# Patient Record
Sex: Male | Born: 1935 | Hispanic: No | Marital: Married | Smoking: Never smoker
Health system: Southern US, Community
[De-identification: ages and names within clinical notes are randomized; demographics above are authoritative.]

## PROBLEM LIST (undated history)

## (undated) DIAGNOSIS — R0902 Hypoxemia: Secondary | ICD-10-CM

## (undated) DIAGNOSIS — R0609 Other forms of dyspnea: Secondary | ICD-10-CM

## (undated) DIAGNOSIS — Z950 Presence of cardiac pacemaker: Secondary | ICD-10-CM

## (undated) DIAGNOSIS — J189 Pneumonia, unspecified organism: Secondary | ICD-10-CM

## (undated) DIAGNOSIS — N400 Enlarged prostate without lower urinary tract symptoms: Secondary | ICD-10-CM

## (undated) DIAGNOSIS — Z9981 Dependence on supplemental oxygen: Secondary | ICD-10-CM

## (undated) DIAGNOSIS — E785 Hyperlipidemia, unspecified: Secondary | ICD-10-CM

## (undated) DIAGNOSIS — I2510 Atherosclerotic heart disease of native coronary artery without angina pectoris: Secondary | ICD-10-CM

## (undated) DIAGNOSIS — Z974 Presence of external hearing-aid: Secondary | ICD-10-CM

## (undated) DIAGNOSIS — Z9989 Dependence on other enabling machines and devices: Secondary | ICD-10-CM

## (undated) DIAGNOSIS — J8410 Pulmonary fibrosis, unspecified: Secondary | ICD-10-CM

## (undated) DIAGNOSIS — R0600 Dyspnea, unspecified: Secondary | ICD-10-CM

## (undated) DIAGNOSIS — G4733 Obstructive sleep apnea (adult) (pediatric): Secondary | ICD-10-CM

## (undated) DIAGNOSIS — K219 Gastro-esophageal reflux disease without esophagitis: Secondary | ICD-10-CM

## (undated) DIAGNOSIS — I10 Essential (primary) hypertension: Secondary | ICD-10-CM

## (undated) HISTORY — DX: Other forms of dyspnea: R06.09

## (undated) HISTORY — DX: Pulmonary fibrosis, unspecified: J84.10

## (undated) HISTORY — DX: Atherosclerotic heart disease of native coronary artery without angina pectoris: I25.10

## (undated) HISTORY — DX: Hypoxemia: R09.02

## (undated) HISTORY — PX: INGUINAL HERNIA REPAIR: SUR1180

## (undated) HISTORY — DX: Dyspnea, unspecified: R06.00

## (undated) HISTORY — PX: COLONOSCOPY WITH ESOPHAGOGASTRODUODENOSCOPY (EGD): SHX5779

## (undated) HISTORY — DX: Hyperlipidemia, unspecified: E78.5

---

## 2006-12-13 ENCOUNTER — Inpatient Hospital Stay: Payer: Self-pay | Admitting: Cardiovascular Disease

## 2006-12-13 ENCOUNTER — Other Ambulatory Visit: Payer: Self-pay

## 2007-05-26 ENCOUNTER — Ambulatory Visit: Payer: Self-pay | Admitting: Cardiovascular Disease

## 2007-12-27 ENCOUNTER — Ambulatory Visit: Payer: Self-pay | Admitting: Surgery

## 2007-12-29 ENCOUNTER — Ambulatory Visit: Payer: Self-pay | Admitting: Surgery

## 2007-12-30 ENCOUNTER — Emergency Department: Payer: Self-pay | Admitting: Emergency Medicine

## 2008-03-22 HISTORY — PX: CORONARY ANGIOPLASTY WITH STENT PLACEMENT: SHX49

## 2009-11-18 ENCOUNTER — Emergency Department: Payer: Self-pay | Admitting: Emergency Medicine

## 2012-05-05 ENCOUNTER — Ambulatory Visit: Payer: Self-pay | Admitting: Cardiovascular Disease

## 2012-05-12 ENCOUNTER — Encounter: Payer: Self-pay | Admitting: Cardiovascular Disease

## 2012-05-12 ENCOUNTER — Ambulatory Visit (INDEPENDENT_AMBULATORY_CARE_PROVIDER_SITE_OTHER): Payer: Medicare Other | Admitting: Cardiovascular Disease

## 2012-05-12 VITALS — BP 120/62 | Ht 68.0 in | Wt 169.2 lb

## 2012-05-12 DIAGNOSIS — R0989 Other specified symptoms and signs involving the circulatory and respiratory systems: Secondary | ICD-10-CM

## 2012-05-12 DIAGNOSIS — R0609 Other forms of dyspnea: Secondary | ICD-10-CM

## 2012-05-12 DIAGNOSIS — I2510 Atherosclerotic heart disease of native coronary artery without angina pectoris: Secondary | ICD-10-CM

## 2012-05-12 DIAGNOSIS — R0600 Dyspnea, unspecified: Secondary | ICD-10-CM

## 2012-05-12 DIAGNOSIS — E785 Hyperlipidemia, unspecified: Secondary | ICD-10-CM

## 2012-05-12 NOTE — Assessment & Plan Note (Signed)
He is doing well overall except for mild exertional dyspnea with no recent cardiac evaluation. Thus, I will obtain a treadmill stress test. Baseline ECG is normal. Continue medical therapy.

## 2012-05-12 NOTE — Assessment & Plan Note (Signed)
He has not had any recent blood work. I will check routine labs on him today. Continue treatment with rosuvastatin. He has not taken niacin lately.

## 2012-05-12 NOTE — Patient Instructions (Addendum)
Labs today.   Your physician has requested that you have an exercise tolerance test. For further information please visit [email or web address] Please also follow instruction sheet, as given.  Continue same medications.  Follow up in 1 year.

## 2012-05-12 NOTE — Progress Notes (Signed)
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 I saw him in 2008 when he presented with unstable angina. Cardiac catheterization showed 99% ostial first diagonal stenosis as well as mild left main and LAD disease. He underwent successful angioplasty and drug-eluting stent placement to the ostial first diagonal. He has not had any cardiac events since then. He was on dual antiplatelet therapy for one year. He has known history of hyperlipidemia. Overall, he has been doing very well with no recurrent chest pain. He only reports mild exertional dyspnea. No palpitations, orthopnea or PND.  No Known Allergies   No current outpatient prescriptions on file prior to visit.   No current facility-administered medications on file prior to visit.     Past Medical History  Diagnosis Date  . Coronary artery disease     Cardiac cath September 2008: 20% left main stenosis, 40% mid LAD stenosis, 99% ostial first diagonal stenosis in a large branch and 90% ostial disease in second diagonal but the vessel was very small, 20% mid RCA stenosis with normal ejection fraction. Successful angioplasty and drug-eluting stent placement to the ostial first diagonal with a 2.5 x 15 mm Xience stent  . Hyperlipidemia      Past Surgical History  Procedure Laterality Date  . Cardiac catheterization      2010  . Coronary angioplasty  2010     History reviewed. No pertinent family history.   History   Social History  . Marital Status: Married    Spouse Name: N/A    Number of Children: N/A  . Years of Education: N/A   Occupational History  . Not on file.   Social History Main Topics  . Smoking status: Never Smoker   . Smokeless tobacco: Not on file  . Alcohol Use: Not on file  . Drug Use: Not on file  . Sexually Active: Not on file   Other Topics Concern  . Not on file   Social History Narrative  . No narrative on file      ROS Constitutional: Negative for fever, chills, diaphoresis, activity change, appetite change and fatigue.  HENT: Negative for hearing loss, nosebleeds, congestion, sore throat, facial swelling, drooling, trouble swallowing, neck pain, voice change, sinus pressure and tinnitus.  Eyes: Negative for photophobia, pain, discharge and visual disturbance.  Respiratory: Negative for apnea, cough, chest tightness  and wheezing.  Cardiovascular: Negative for chest pain, palpitations and leg swelling.  Gastrointestinal: Negative for nausea, vomiting, abdominal pain, diarrhea, constipation, blood in stool and abdominal distention.  Genitourinary: Negative for dysuria, urgency, frequency, hematuria and decreased urine volume.  Musculoskeletal: Negative for myalgias, back pain, joint swelling, arthralgias and gait problem.  Skin: Negative for color change, pallor, rash and wound.  Neurological: Negative for dizziness, tremors, seizures, syncope, speech difficulty, weakness, light-headedness, numbness and headaches.  Psychiatric/Behavioral: Negative for suicidal ideas, hallucinations, behavioral problems and agitation. The patient is not nervous/anxious.     PHYSICAL EXAM   BP 120/62  Ht 5\' 8"  (1.727 m)  Wt 169 lb 4 oz (76.771 kg)  BMI 25.74 kg/m2 Constitutional: He is oriented to person, place, and time. He appears well-developed and well-nourished. No distress.  HENT: No nasal discharge.  Head: Normocephalic and atraumatic.  Eyes: Pupils are equal and round. Right eye exhibits no discharge. Left eye exhibits no discharge.  Neck: Normal range of motion. Neck supple. No JVD present. No thyromegaly present.  Cardiovascular: Normal rate,  regular rhythm, normal heart sounds and. Exam reveals no gallop and no friction rub. There is a 1/6 systolic ejection murmur at the aortic area. Pulmonary/Chest: Effort normal and breath sounds normal. No stridor. No respiratory distress. He has no wheezes. He  has no rales. He exhibits no tenderness.  Abdominal: Soft. Bowel sounds are normal. He exhibits no distension. There is no tenderness. There is no rebound and no guarding.  Musculoskeletal: Normal range of motion. He exhibits no edema and no tenderness.  Neurological: He is alert and oriented to person, place, and time. Coordination normal.  Skin: Skin is warm and dry. No rash noted. He is not diaphoretic. No erythema. No pallor.  Psychiatric: He has a normal mood and affect. His behavior is normal. Judgment and thought content normal.       EKG: Normal sinus rhythm   ASSESSMENT AND PLAN

## 2012-05-13 LAB — CBC WITH DIFFERENTIAL/PLATELET
Basophils Absolute: 0 10*3/uL (ref 0.0–0.2)
Basos: 0 % (ref 0–3)
Eosinophils Absolute: 0.2 10*3/uL (ref 0.0–0.4)
HCT: 41 % (ref 37.5–51.0)
Hemoglobin: 13.6 g/dL (ref 12.6–17.7)
Lymphocytes Absolute: 1.6 10*3/uL (ref 0.7–3.1)
MCH: 29.9 pg (ref 26.6–33.0)
MCHC: 33.2 g/dL (ref 31.5–35.7)
MCV: 90 fL (ref 79–97)
Monocytes Absolute: 0.6 10*3/uL (ref 0.1–0.9)
Neutrophils Absolute: 4.8 10*3/uL (ref 1.4–7.0)
RBC: 4.55 x10E6/uL (ref 4.14–5.80)

## 2012-05-13 LAB — HEPATIC FUNCTION PANEL
AST: 20 IU/L (ref 0–40)
Albumin: 3.9 g/dL (ref 3.5–4.8)
Total Bilirubin: 0.4 mg/dL (ref 0.0–1.2)
Total Protein: 7.1 g/dL (ref 6.0–8.5)

## 2012-05-13 LAB — LIPID PANEL
Chol/HDL Ratio: 3.8 ratio units (ref 0.0–5.0)
Cholesterol, Total: 130 mg/dL (ref 100–199)
LDL Calculated: 84 mg/dL (ref 0–99)
VLDL Cholesterol Cal: 12 mg/dL (ref 5–40)

## 2012-05-13 LAB — BASIC METABOLIC PANEL
BUN/Creatinine Ratio: 13 (ref 10–22)
Chloride: 101 mmol/L (ref 97–108)
GFR calc Af Amer: 96 mL/min/{1.73_m2} (ref >59)
GFR calc non Af Amer: 83 mL/min/{1.73_m2} (ref >59)
Glucose: 97 mg/dL (ref 65–99)
Potassium: 4.6 mmol/L (ref 3.5–5.2)

## 2012-05-18 ENCOUNTER — Encounter: Payer: Medicare Other | Admitting: Cardiovascular Disease

## 2012-11-06 ENCOUNTER — Ambulatory Visit: Payer: Medicare Other | Admitting: Cardiovascular Disease

## 2012-12-11 ENCOUNTER — Ambulatory Visit (INDEPENDENT_AMBULATORY_CARE_PROVIDER_SITE_OTHER): Payer: Medicare Other | Admitting: Cardiovascular Disease

## 2012-12-11 ENCOUNTER — Encounter: Payer: Self-pay | Admitting: Cardiovascular Disease

## 2012-12-11 DIAGNOSIS — R0609 Other forms of dyspnea: Secondary | ICD-10-CM

## 2012-12-11 DIAGNOSIS — R0600 Dyspnea, unspecified: Secondary | ICD-10-CM

## 2012-12-11 NOTE — Procedures (Signed)
    Treadmill Stress test  Indication: Exertional dyspnea with known history of coronary artery disease.  Baseline Data:  Resting EKG shows NSR with rate of 70 bpm, no significant ST or T wave changes. Resting blood pressure of 128/80 mm Hg Stand bruce protocal was used.  Exercise Data:  Patient exercised for 6 min 43 sec,  Peak heart rate of 130 bpm.  This was 92 % of the maximum predicted heart rate. No symptoms of chest pain or lightheadedness were reported at peak stress or in recovery.  Peak Blood pressure recorded was 180/82 Maximal work level: 7 METs.  Heart rate at 3 minutes in recovery was 78 bpm. BP response: Normal HR response: Normal  EKG with Exercise: Sinus tachycardia with no significant ST changes.  FINAL IMPRESSION: Normal exercise stress test. No significant EKG changes concerning for ischemia. Good exercise tolerance.  Recommendation: Continue medical therapy. I advised him to start an exercise program.

## 2012-12-11 NOTE — Patient Instructions (Addendum)
Your stress test is normal.  Follow up in 6 months.

## 2013-03-09 ENCOUNTER — Ambulatory Visit: Payer: Self-pay | Admitting: Unknown Physician Specialty

## 2013-03-16 LAB — PATHOLOGY REPORT

## 2013-07-13 ENCOUNTER — Ambulatory Visit: Payer: Medicare Other | Admitting: Cardiovascular Disease

## 2013-07-23 ENCOUNTER — Ambulatory Visit (INDEPENDENT_AMBULATORY_CARE_PROVIDER_SITE_OTHER): Payer: Medicare Other | Admitting: Cardiovascular Disease

## 2013-07-23 ENCOUNTER — Encounter: Payer: Self-pay | Admitting: Cardiovascular Disease

## 2013-07-23 ENCOUNTER — Ambulatory Visit: Payer: Medicare Other | Admitting: Cardiovascular Disease

## 2013-07-23 VITALS — BP 104/68 | HR 72 | Ht 68.0 in | Wt 158.5 lb

## 2013-07-23 DIAGNOSIS — I2510 Atherosclerotic heart disease of native coronary artery without angina pectoris: Secondary | ICD-10-CM

## 2013-07-23 DIAGNOSIS — R0602 Shortness of breath: Secondary | ICD-10-CM

## 2013-07-23 DIAGNOSIS — E785 Hyperlipidemia, unspecified: Secondary | ICD-10-CM

## 2013-07-23 NOTE — Progress Notes (Signed)
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 In 2008 when he presented with unstable angina. Cardiac catheterization showed 99% ostial first diagonal stenosis as well as mild left main and LAD disease. He underwent successful angioplasty and drug-eluting stent placement to the ostial first diagonal. He has not had any cardiac events since then. He was on dual antiplatelet therapy for one year. He has known history of hyperlipidemia. Overall, he has been doing very well with no recurrent chest pain. He had exertional dyspnea last year. He underwent a treadmill stress test which showed no evidence of ischemia. He stays very active with gardening. He asked he feels better than before. He has no significant exertional limitation.  No Known Allergies   Current Outpatient Prescriptions on File Prior to Visit  Medication Sig Dispense Refill  . aspirin 325 MG tablet Take 325 mg by mouth daily.      . EXELON 9.5 MG/24HR Place 1 patch onto the skin daily.       . metoprolol tartrate (LOPRESSOR) 25 MG tablet Take 25 mg by mouth 2 (two) times daily.       . rosuvastatin (CRESTOR) 20 MG tablet Take 20 mg by mouth daily.      [name]. zolpidem (AMBIEN) 10 MG tablet Take 10 mg by mouth as needed.        No current facility-administered medications on file prior to visit.     Past Medical History  Diagnosis Date  . Coronary artery disease     Cardiac cath September 2008: 20% left main stenosis, 40% mid LAD stenosis, 99% ostial first diagonal stenosis in a large branch and 90% ostial disease in second diagonal but the vessel was very small, 20% mid RCA stenosis with normal ejection fraction. Successful angioplasty and drug-eluting stent placement to the ostial first diagonal with a 2.5 x 15 mm Xience stent  . Hyperlipidemia      Past Surgical History  Procedure Laterality Date  . Cardiac catheterization      2010  .  Coronary angioplasty  2010     Family History  Problem Relation Age of Onset  . Family history unknown: Yes     History   Social History  . Marital Status: Married    Spouse Name: N/A    Number of Children: N/A  . Years of Education: N/A   Occupational History  . Not on file.   Social History Main Topics  . Smoking status: Never Smoker   . Smokeless tobacco: Not on file  . Alcohol Use: Not on file  . Drug Use: Not on file  . Sexual Activity: Not on file   Other Topics Concern  . Not on file   Social History Narrative  . No narrative on file       PHYSICAL EXAM   BP 104/68  Pulse 72  Ht 5\' 8"  (1.727 m)  Wt 158 lb 8 oz (71.895 kg)  BMI 24.11 kg/m2 Constitutional: He is oriented to person, place, and time. He appears well-developed and well-nourished. No distress.  HENT: No nasal discharge.  Head: Normocephalic and atraumatic.  Eyes: Pupils are equal and round. Right eye exhibits no discharge. Left eye exhibits no discharge.  Neck: Normal range of motion. Neck supple. No JVD present. No thyromegaly present.  Cardiovascular: Normal rate, regular rhythm, normal heart sounds and. Exam reveals no gallop and no friction rub. There  is a 1/6 systolic ejection murmur at the aortic area. Pulmonary/Chest: Effort normal and breath sounds normal. No stridor. No respiratory distress. He has no wheezes. He has no rales. He exhibits no tenderness.  Abdominal: Soft. Bowel sounds are normal. He exhibits no distension. There is no tenderness. There is no rebound and no guarding.  Musculoskeletal: Normal range of motion. He exhibits no edema and no tenderness.  Neurological: He is alert and oriented to person, place, and time. Coordination normal.  Skin: Skin is warm and dry. No rash noted. He is not diaphoretic. No erythema. No pallor.  Psychiatric: He has a normal mood and affect. His behavior is normal. Judgment and thought content normal.       EKG: Normal sinus  rhythm   ASSESSMENT AND PLAN

## 2013-07-23 NOTE — Patient Instructions (Signed)
Labs today.   Continue same medications.   Your physician wants you to follow-up in: 1 year.  You will receive a reminder letter in the mail two months in advance. If you don't receive a letter, please call our office to schedule the follow-up appointment.

## 2013-07-23 NOTE — Assessment & Plan Note (Signed)
Lab Results  Component Value Date   HDL 34* 05/12/2012   LDLCALC 84 05/12/2012   TRIG 62 05/12/2012   CHOLHDL 3.8 05/12/2012   Continue treatment with rosuvastatin. Check fasting lipid and liver profile today.

## 2013-07-23 NOTE — Assessment & Plan Note (Signed)
He is doing very well with no symptoms suggestive of angina. Continue medical therapy. Stress test last year was normal.

## 2013-07-24 LAB — HEPATIC FUNCTION PANEL
ALBUMIN: 3.9 g/dL (ref 3.5–4.8)
ALK PHOS: 65 IU/L (ref 39–117)
ALT: 10 IU/L (ref 0–44)
AST: 27 IU/L (ref 0–40)
BILIRUBIN TOTAL: 0.5 mg/dL (ref 0.0–1.2)
Bilirubin, Direct: 0.14 mg/dL (ref 0.00–0.40)
Total Protein: 7.2 g/dL (ref 6.0–8.5)

## 2013-07-24 LAB — LIPID PANEL
CHOLESTEROL TOTAL: 122 mg/dL (ref 100–199)
Chol/HDL Ratio: 3.1 ratio units (ref 0.0–5.0)
HDL: 39 mg/dL — ABNORMAL LOW (ref >39)
LDL Calculated: 72 mg/dL (ref 0–99)
TRIGLYCERIDES: 55 mg/dL (ref 0–149)
VLDL Cholesterol Cal: 11 mg/dL (ref 5–40)

## 2014-06-10 ENCOUNTER — Encounter: Payer: Self-pay | Admitting: Cardiovascular Disease

## 2014-06-10 ENCOUNTER — Ambulatory Visit (INDEPENDENT_AMBULATORY_CARE_PROVIDER_SITE_OTHER): Payer: Medicare Other | Admitting: Cardiovascular Disease

## 2014-06-10 VITALS — BP 122/70 | HR 63 | Ht 68.5 in | Wt 158.2 lb

## 2014-06-10 DIAGNOSIS — I2510 Atherosclerotic heart disease of native coronary artery without angina pectoris: Secondary | ICD-10-CM

## 2014-06-10 DIAGNOSIS — E785 Hyperlipidemia, unspecified: Secondary | ICD-10-CM

## 2014-06-10 DIAGNOSIS — R079 Chest pain, unspecified: Secondary | ICD-10-CM

## 2014-06-10 NOTE — Assessment & Plan Note (Signed)
Lab Results  Component Value Date   CHOL 122 07/23/2013   HDL 39* 07/23/2013   LDLCALC 72 07/23/2013   TRIG 55 07/23/2013   CHOLHDL 3.1 07/23/2013   Continued continue treatment with high-dose rosuvastatin. Most recent LDL was 72.

## 2014-06-10 NOTE — Progress Notes (Signed)
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 In 2008 when he presented with unstable angina. Cardiac catheterization showed 99% ostial first diagonal stenosis as well as mild left main and LAD disease. He underwent successful angioplasty and drug-eluting stent placement to the ostial first diagonal. He has not had any cardiac events since then. He was on dual antiplatelet therapy for one year. He has known history of hyperlipidemia. He stays very active with gardening. Most recent treadmill stress test in September 2014 showed no evidence of ischemia. He had spicy food on Friday night. He started having significant heartburn on Saturday associated with hiccups which has continued throughout 10  . Coronary angioplasty  2010     Family History  Problem Relation Age of Onset  . Family history unknown: Yes     History   Social History  . Marital Status: Married    Spouse Name: N/A  . Number of Children: N/A  . Years of Education: N/A   Occupational History  . Not on file.   Social History Main Topics  . Smoking status: Never Smoker   . Smokeless tobacco: Not on file  . Alcohol Use: No  . Drug Use: No  . Sexual Activity: Not on file   Other Topics Concern  . Not on file   Social History Narrative       PHYSICAL EXAM   BP 122/70 mmHg  Pulse 63  Ht 5' 8.5" (1.74 m)  Wt 158 lb 4 oz (71.782 kg)  BMI 23.71 kg/m2 Constitutional: He is oriented to person, place, and time. He appears well-developed and well-nourished. No distress.  HENT: No nasal discharge.  Head: Normocephalic and atraumatic.  Eyes: Pupils are equal and round. Right eye exhibits no discharge. Left eye exhibits no discharge.  Neck: Normal range of motion. Neck supple. No JVD present. No thyromegaly present.  Cardiovascular: Normal rate, regular rhythm, normal heart sounds and. Exam reveals no gallop and no friction rub. There is a 1/6 systolic ejection murmur at the aortic area. Pulmonary/Chest: Effort normal and breath sounds normal. No stridor. No respiratory distress. He has no wheezes. He has no rales.  He exhibits no tenderness.  Abdominal: Soft. Bowel sounds are normal. He exhibits no distension. There is no tenderness. There is no rebound and no guarding.  Musculoskeletal: Normal range of motion. He exhibits no edema and no tenderness.  Neurological: He is alert and oriented to person, place, and time. Coordination normal.  Skin: Skin is warm and dry. No rash noted. He is not diaphoretic. No erythema. No pallor.  Psychiatric: He has a normal mood and affect. His behavior is normal. Judgment and thought content normal.       EKG: Normal sinus  rhythm   ASSESSMENT AND PLAN

## 2014-06-10 NOTE — Patient Instructions (Addendum)
[facility] MYOVIEW  Your caregiver has ordered a Stress Test with nuclear imaging. The purpose of this test is to evaluate the blood supply to your heart muscle. This procedure is referred to as a "Non-Invasive Stress Test." This is because other than having an IV started in your vein, nothing is inserted or "invades" your body. Cardiac stress tests are done to find areas of poor blood flow to the heart by determining the extent of coronary artery disease (CAD). Some patients exercise on a treadmill, which naturally increases the blood flow to your heart, while others who are  unable to walk on a treadmill due to physical limitations have a pharmacologic/chemical stress agent called Lexiscan . This medicine will mimic walking on a treadmill by temporarily increasing your coronary blood flow.   Please note: these test may take anywhere between 2-4 hours to complete  PLE[facility] REPORT TO [facility]  THE VOLUNTEERS AT THE FIRST DESK WILL DIRECT YOU WHERE TO GO  Date of Procedure:_Tuesday, March 22_________________________________  Arrival Time for Procedure:_9:45 am___________________________  Instructions regarding medication:  Hold Metoprolol the night before the test  ________________________________________________________________________________________________________________________________________________________________________________________________________________________________________________________________________  PLEASE NOTIFY THE OFFICE AT LEAST 24 HOURS IN ADVANCE IF YOU ARE UNABLE TO KEEP YOUR APPOINTMENT.  [phone] AND  PLEASE NOTIFY NUC[facility]AR MEDICINE AT [facility] AT LEAST 24 HOURS IN ADVANCE IF YOU ARE UNABLE TO KEEP YOUR APPOINTMENT. [phone]  How to prepare for your Myoview test:  1. Do not eat or drink after midnight 2. No caffeine for 24 hours prior to test 3. No smoking 24 hours prior to test. 4. Your medication may be taken with water.  If your doctor  stopped a medication because of this test, do not take that medication. 5. Ladies, please do not wear dresses.  Skirts or pants are appropriate. Please wear a short sleeve shirt. 6. No perfume, cologne or lotion. 7. Wear comfortable walking shoes. No heels!       Your physician wants you to follow-up in: 1 year with Dr. Kirke CorinArida. You will receive a reminder letter in the mail two months in advance. If you don't receive a letter, please call our office to schedule the follow-up appointment.

## 2014-06-10 NOTE — Assessment & Plan Note (Signed)
His symptoms are likely GI in nature due to GERD. However, he had significant dyspnea which is unusual for him. He has known history of coronary artery disease with previous drug-eluting stent placement to the first diagonal. At that time, he had residual mild disease affecting the left main coronary artery as well as the LAD. Due to that, I recommend evaluation with a treadmill nuclear stress test.

## 2014-06-11 ENCOUNTER — Ambulatory Visit: Payer: Self-pay | Admitting: Cardiovascular Disease

## 2014-06-11 ENCOUNTER — Other Ambulatory Visit: Payer: Self-pay

## 2014-06-11 DIAGNOSIS — R079 Chest pain, unspecified: Secondary | ICD-10-CM | POA: Diagnosis not present

## 2014-06-11 DIAGNOSIS — I2510 Atherosclerotic heart disease of native coronary artery without angina pectoris: Secondary | ICD-10-CM

## 2014-09-13 ENCOUNTER — Other Ambulatory Visit: Payer: Self-pay

## 2014-09-13 ENCOUNTER — Encounter: Payer: Self-pay | Admitting: Emergency Medicine

## 2014-09-13 ENCOUNTER — Emergency Department: Payer: Medicare Other

## 2014-09-13 ENCOUNTER — Emergency Department
Admission: EM | Admit: 2014-09-13 | Discharge: 2014-09-13 | Disposition: A | Discharge status: Home / Self Care | Payer: Medicare Other | Attending: Emergency Medicine | Admitting: Emergency Medicine

## 2014-09-13 DIAGNOSIS — I10 Essential (primary) hypertension: Secondary | ICD-10-CM | POA: Diagnosis not present

## 2014-09-13 DIAGNOSIS — Z7982 Long term (current) use of aspirin: Secondary | ICD-10-CM | POA: Insufficient documentation

## 2014-09-13 DIAGNOSIS — R42 Dizziness and giddiness: Secondary | ICD-10-CM | POA: Insufficient documentation

## 2014-09-13 DIAGNOSIS — Z79899 Other long term (current) drug therapy: Secondary | ICD-10-CM | POA: Diagnosis not present

## 2014-09-13 DIAGNOSIS — R0602 Shortness of breath: Secondary | ICD-10-CM | POA: Diagnosis not present

## 2014-09-13 HISTORY — DX: Essential (primary) hypertension: I10

## 2014-09-13 LAB — BASIC METABOLIC PANEL
Anion gap: 7 (ref 5–15)
BUN: 16 mg/dL (ref 6–20)
CALCIUM: 8.6 mg/dL — AB (ref 8.9–10.3)
CO2: 25 mmol/L (ref 22–32)
Chloride: 107 mmol/L (ref 101–111)
Creatinine, Ser: 0.91 mg/dL (ref 0.61–1.24)
GFR calc Af Amer: >60 mL/min (ref >60)
GFR calc non Af Amer: >60 mL/min (ref >60)
GLUCOSE: 123 mg/dL — AB (ref 65–99)
Potassium: 4.2 mmol/L (ref 3.5–5.1)
Sodium: 139 mmol/L (ref 135–145)

## 2014-09-13 LAB — CBC
HEMATOCRIT: 39.2 % — AB (ref 40.0–52.0)
HEMOGLOBIN: 13 g/dL (ref 13.0–18.0)
MCH: 29.4 pg (ref 26.0–34.0)
MCHC: 33.1 g/dL (ref 32.0–36.0)
MCV: 88.9 fL (ref 80.0–100.0)
Platelets: 206 10*3/uL (ref 150–440)
RBC: 4.41 MIL/uL (ref 4.40–5.90)
RDW: 14.6 % — AB (ref 11.5–14.5)
WBC: 6.1 10*3/uL (ref 3.8–10.6)

## 2014-09-13 LAB — TROPONIN I: Troponin I: <0.03 ng/mL (ref <0.031)

## 2014-09-13 MED ORDER — SODIUM CHLORIDE 0.9 % IV BOLUS (SEPSIS)
500.0000 mL | Freq: Once | INTRAVENOUS | Status: AC
  Administered 2014-09-13: 500 mL via INTRAVENOUS

## 2014-09-13 NOTE — ED Notes (Signed)
MD at bedside. 

## 2014-09-13 NOTE — ED Provider Notes (Signed)
[facility] Emergency Department Provider Note    ____________________________________________  Time seen: 1130  I have reviewed the triage vital signs and the nursing notes.   HISTORY  Chief Complaint Dizziness   History limited by: Not Limited   HPI Vincent Kennedy is a 81 y.o. male who presents to the emergency department today because of concerns for dizziness.He states that this started today slightly after breakfast. He is currently fasting. He only eats or drinks in the morning and at night. He has noticed for the past 3 or 4 days he has become dizzy after prayer and upon standing. Today he got very dizzy and actually fell to the ground. No significant traumatic injury. He denies any associated chest pain or shortness breath.   Past Medical History  Diagnosis Date  . Coronary artery disease     Cardiac cath September 2008: 20% left main stenosis, 40% mid LAD stenosis, 99% ostial first diagonal stenosis in a large branch and 90% ostial disease in second diagonal but the vessel was very small, 20% mid RCA stenosis with normal ejection fraction. Successful angioplasty and drug-eluting stent placement to the ostial first diagonal with a 2.5 x 15 mm Xience stent  . Hyperlipidemia   . Hypertension     Patient Active Problem List   Diagnosis Date Noted  . Coronary artery disease   . Hyperlipidemia     Past Surgical History  Procedure Laterality Date  . Cardiac catheterization      2010  . Coronary angioplasty  2010    Current Outpatient Rx  Name  Route  Sig  Dispense  Refill  . aspirin 325 MG tablet   Oral   Take 325 mg by mouth daily.         . metoprolol tartrate (LOPRESSOR) 25 MG tablet   Oral   Take 25 mg by mouth daily.         . rivastigmine (EXELON) 3 MG capsule   Oral   Take 3 mg by mouth 2 (two) times daily.         . rosuvastatin (CRESTOR) 20 MG tablet   Oral   Take 20 mg by mouth daily.         [name] zolpidem  (AMBIEN) 5 MG tablet   Oral   Take 5 mg by mouth at bedtime as needed for sleep.           Allergies Review of patient's allergies indicates no known allergies.  Family History  Problem Relation Age of Onset  . Family history unknown: Yes    Social History History  Substance Use Topics  . Smoking status: Never Smoker   . Smokeless tobacco: Not on file  . Alcohol Use: No    Review of Systems  Constitutional: Negative for fever. Cardiovascular: Negative for chest pain. Respiratory: Negative for shortness of breath. Gastrointestinal: Negative for abdominal pain, vomiting and diarrhea. Genitourinary: Negative for dysuria. Musculoskeletal: Negative for back pain. Skin: Negative for rash. Neurological: Positive for dizziness   10-point ROS otherwise negative.  ____________________________________________   PHYSICAL EXAM:  VITAL SIGNS: ED Triage Vitals  Enc Vitals Group     BP 09/13/14 1050 155/68 mmHg     Pulse Rate 09/13/14 1050 59     Resp 09/13/14 1050 18     Temp 09/13/14 1050 98.2 F (36.8 C)     Temp Source 09/13/14 1050 Oral     SpO2 09/13/14 1050 97 %     Weight  09/13/14 1050 165 lb (74.844 kg)     Height 09/13/14 1050 5\' 7"  (1.702 m)     Head Cir --      Peak Flow --      Pain Score 09/13/14 1052 0   Constitutional: Alert and oriented. Well appearing and in no distress. Eyes: Conjunctivae are normal. PERRL. Normal extraocular movements. No nystagmus ENT   Head: Normocephalic and atraumatic.   Nose: No congestion/rhinnorhea.   Mouth/Throat: Mucous membranes are moist.   Neck: No stridor. Hematological/Lymphatic/Immunilogical: No cervical lymphadenopathy. Cardiovascular: Normal rate, regular rhythm.  No murmurs, rubs, or gallops. Respiratory: Normal respiratory effort without tachypnea nor retractions. Breath sounds are clear and equal bilaterally. No wheezes/rales/rhonchi. Gastrointestinal: Soft and nontender. No distention.   Genitourinary: Deferred Musculoskeletal: Normal range of motion in all extremities. No joint effusions.  No lower extremity tenderness nor edema. Neurologic:  Normal speech and language. No gross focal neurologic deficits are appreciated. Speech is normal. Gait normal. Romberg normal. Skin:  Skin is warm, dry and intact. No rash noted. Psychiatric: Mood and affect are normal. Speech and behavior are normal. Patient exhibits appropriate insight and judgment.  ____________________________________________    LABS (pertinent positives/negatives)  Labs Reviewed  CBC - Abnormal; Notable for the following:    HCT 39.2 (*)    RDW 14.6 (*)    All other components within normal limits  BASIC METABOLIC PANEL - Abnormal; Notable for the following:    Glucose, Bld 123 (*)    Calcium 8.6 (*)    All other components within normal limits  TROPONIN I     ____________________________________________   EKG  I, Phineas Semen, attending physician, personally viewed and interpreted this EKG  EKG Time: 1051 Rate: 57 Rhythm: sinus bradycardia Axis: normal Intervals: qtc 428 QRS: narrow ST changes: no st elevation    ____________________________________________    RADIOLOGY  CXR IMPRESSION: 1. Mild cardiomegaly. 2. Prominent diffuse interstitial pattern. While this may in part represent edema, a chronic interstitial component is suspected. No comparison films are available.  CT head IMPRESSION: Atrophy with stable supratentorial small vessel disease. No intracranial mass, hemorrhage, or acute appearing infarct. Areas of ethmoid sinus disease bilaterally.  ____________________________________________   PROCEDURES  Procedure(s) performed: None  Critical Care performed: No  ____________________________________________   INITIAL IMPRESSION / ASSESSMENT AND PLAN / ED COURSE  Pertinent labs & imaging results that were available during my care of the patient were reviewed  by me and considered in my medical decision making (see chart for details).  Patient here with dizziness. He is currently in the middle of a long fast. No concerning findings on physical exam. Workup without clear etiology. Patient did feel better after 500 mL IV fluid bolus. Think likely the patient's dizziness as a result of some dehydration given fast. Encourage patient to drink more fluids, discussed return precautions will discharge home.  ____________________________________________   FINAL CLINICAL IMPRESSION(S) / ED DIAGNOSES  Final diagnoses:  Dizziness     Phineas Semen, MD 09/13/14 1512

## 2014-09-13 NOTE — ED Notes (Signed)
Pt to ed with wife.  Pt reports feeling dizzy this am x 1 hour.  Pt states over the last few days he has been mildly dizzy but this am was much worse.  Pt then states he lowered himself to the floor.  Pt denies confusion, denies disorientation.  Reports all over weakness.

## 2014-09-13 NOTE — ED Notes (Signed)
Patient states he has been fasting during the day for the past 3 weeks.  Patient states when he stands up "the room started spinning".

## 2014-09-13 NOTE — Discharge Instructions (Signed)
Please seek medical attention for any high fevers, chest pain, shortness of breath, change in behavior, persistent vomiting, bloody stool or any other new or concerning symptoms. ° °Dizziness °Dizziness is a common problem. It is a feeling of unsteadiness or light-headedness. You may feel like you are about to faint. Dizziness can lead to injury if you stumble or fall. A person of any age group can suffer from dizziness, but dizziness is more common in older adults. °CAUSES  °Dizziness can be caused by many different things, including: °· Middle ear problems. °· Standing for too long. °· Infections. °· An allergic reaction. °· Aging. °· An emotional response to something, such as the sight of blood. °· Side effects of medicines. °· Tiredness. °· Problems with circulation or blood pressure. °· Excessive use of alcohol or medicines, or illegal drug use. °· Breathing too fast (hyperventilation). °· An irregular heart rhythm (arrhythmia). °· A low red blood cell count (anemia). °· Pregnancy. °· Vomiting, diarrhea, fever, or other illnesses that cause body fluid loss (dehydration). °· Diseases or conditions such as Parkinson's disease, high blood pressure (hypertension), diabetes, and thyroid problems. °· Exposure to extreme heat. °DIAGNOSIS  °Your health care provider will ask about your symptoms, perform a physical exam, and perform an electrocardiogram (ECG) to record the electrical activity of your heart. Your health care provider may also perform other heart or blood tests to determine the cause of your dizziness. These may include: °· Transthoracic echocardiogram (TTE). During echocardiography, sound waves are used to evaluate how blood flows through your heart. °· Transesophageal echocardiogram (TEE). °· Cardiac monitoring. This allows your health care provider to monitor your heart rate and rhythm in real time. °· Holter monitor. This is a portable device that records your heartbeat and can help diagnose heart  arrhythmias. It allows your health care provider to track your heart activity for several days if needed. °· Stress tests by exercise or by giving medicine that makes the heart beat faster. °TREATMENT  °Treatment of dizziness depends on the cause of your symptoms and can vary greatly. °HOME CARE INSTRUCTIONS  °· Drink enough fluids to keep your urine clear or pale yellow. This is especially important in very hot weather. In older adults, it is also important in cold weather. °· Take your medicine exactly as directed if your dizziness is caused by medicines. When taking blood pressure medicines, it is especially important to get up slowly. °· Rise slowly from chairs and steady yourself until you feel okay. °· In the morning, first sit up on the side of the bed. When you feel okay, stand slowly while holding onto something until you know your balance is fine. °· Move your legs often if you need to stand in one place for a long time. Tighten and relax your muscles in your legs while standing. °· Have someone stay with you for 1-2 days if dizziness continues to be a problem. Do this until you feel you are well enough to stay alone. Have the person call your health care provider if he or she notices changes in you that are concerning. °· Do not drive or use heavy machinery if you feel dizzy. °· Do not drink alcohol. °SEEK IMMEDIATE MEDICAL CARE IF:  °· Your dizziness or light-headedness gets worse. °· You feel nauseous or vomit. °· You have problems talking, walking, or using your arms, hands, or legs. °· You feel weak. °· You are not thinking clearly or you have trouble forming sentences. It may   take a friend or family member to notice this. °· You have chest pain, abdominal pain, shortness of breath, or sweating. °· Your vision changes. °· You notice any bleeding. °· You have side effects from medicine that seems to be getting worse rather than better. °MAKE SURE YOU:  °· Understand these instructions. °· Will watch  your condition. °· Will get help right away if you are not doing well or get worse. °Document Released: 09/01/2000 Document Revised: 03/13/2013 Document Reviewed: 09/25/2010 °[employer]® Patient Information ©2015 [employer]. This information is not intended to replace advice given to you by your health care provider. Make sure you discuss any questions you have with your health care provider. ° ° °

## 2014-09-13 NOTE — ED Notes (Signed)
Patient transported to CT 

## 2014-09-16 DIAGNOSIS — H2513 Age-related nuclear cataract, bilateral: Secondary | ICD-10-CM | POA: Diagnosis not present

## 2014-10-04 DIAGNOSIS — H903 Sensorineural hearing loss, bilateral: Secondary | ICD-10-CM | POA: Diagnosis not present

## 2014-10-04 DIAGNOSIS — H6123 Impacted cerumen, bilateral: Secondary | ICD-10-CM | POA: Diagnosis not present

## 2014-10-04 DIAGNOSIS — R0982 Postnasal drip: Secondary | ICD-10-CM | POA: Diagnosis not present

## 2014-10-16 ENCOUNTER — Encounter: Payer: Self-pay | Admitting: Family Medicine

## 2014-10-16 ENCOUNTER — Ambulatory Visit (INDEPENDENT_AMBULATORY_CARE_PROVIDER_SITE_OTHER): Payer: Medicare Other | Admitting: Family Medicine

## 2014-10-16 VITALS — BP 122/74 | HR 65 | Temp 97.4°F | Resp 16 | Ht 67.0 in | Wt 155.8 lb

## 2014-10-16 DIAGNOSIS — H9190 Unspecified hearing loss, unspecified ear: Secondary | ICD-10-CM | POA: Insufficient documentation

## 2014-10-16 DIAGNOSIS — Z0000 Encounter for general adult medical examination without abnormal findings: Secondary | ICD-10-CM | POA: Diagnosis not present

## 2014-10-16 DIAGNOSIS — I2510 Atherosclerotic heart disease of native coronary artery without angina pectoris: Secondary | ICD-10-CM | POA: Diagnosis not present

## 2014-10-16 DIAGNOSIS — F0280 Dementia in other diseases classified elsewhere without behavioral disturbance: Secondary | ICD-10-CM | POA: Insufficient documentation

## 2014-10-16 DIAGNOSIS — Z23 Encounter for immunization: Secondary | ICD-10-CM | POA: Insufficient documentation

## 2014-10-16 DIAGNOSIS — G309 Alzheimer's disease, unspecified: Secondary | ICD-10-CM

## 2014-10-16 DIAGNOSIS — Z955 Presence of coronary angioplasty implant and graft: Secondary | ICD-10-CM | POA: Diagnosis not present

## 2014-10-16 DIAGNOSIS — E785 Hyperlipidemia, unspecified: Secondary | ICD-10-CM | POA: Insufficient documentation

## 2014-10-16 DIAGNOSIS — I10 Essential (primary) hypertension: Secondary | ICD-10-CM | POA: Insufficient documentation

## 2014-10-16 MED ORDER — RIVASTIGMINE 9.5 MG/24HR TD PT24: 9.5000 mg | MEDICATED_PATCH | Freq: Every day | TRANSDERMAL | Status: DC

## 2014-10-16 NOTE — Progress Notes (Signed)
Name: Vincent Kennedy   MRN: [id number]    DOB: 1932/06/12   Date:10/16/2014       Progress Note  Subjective  Chief Complaint  Chief Complaint  Patient presents with  . 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  His current medical picture is significant for CAD, s/p drug eluding stenting, HLD, HTN. He was on dual antiplatelet therapy for one year after his stent placement and is now on high dose ASA alone along with a statin medication and beta blocker. He is followed by Dr. [name] for Cardiology specialty. He last saw Dr. [name] around March 2016 due to hiccups after eating some spicy food. He was also having heart burn which was unusual for him. Therefore they decided to proceed with a repeat stress test which was normal. He has had H. Pylori in the past as well but he voices no gastritis or GERD symptoms recently. About 1 month ago during religious fasting month he had some postural dizziness and was seen in the ER, several studies done which were within normal limits and he was rehydrated and sent home. The patient has been in otherwise good general health and voices no acute concerns today. He is going out of the country and would like to go back to the patch from of his Exelon medication for his memory issues.     Past Medical History  Diagnosis Date  . Coronary artery disease     Cardiac cath September 2008: 20% left main stenosis, 40% mid LAD stenosis, 99% ostial first diagonal stenosis in a large branch and 90% ostial disease in second diagonal but the vessel was very small, 20% mid RCA stenosis with normal ejection fraction. Successful angioplasty and drug-eluting stent placement to the ostial first diagonal with a 2.5 x 15 mm Xience stent   . Hyperlipidemia   . Hypertension   . Abnormal nuclear cardiac imaging test     Dr. [name]  . Cataract     Past Surgical History  Procedure Laterality Date  . Cardiac catheterization      2010  . Coronary angioplasty  2010  . Hernia repair Bilateral     inguinal    Family History  Problem Relation Age of Onset  . Family history unknown: Yes    History   Social History  . Marital Status: Married    Spouse Name: N/A  . Number of Children: N/A  . Years of Education: N/A   Occupational History  . retired    Social History Main Topics  . Smoking status: Never Smoker   . Smokeless tobacco: Not on file  . Alcohol Use: No  . Drug Use: No  . Sexual Activity: Yes     Comment: patient states minor   Other Topics Concern  . Not on file   Social History Narrative     Current outpatient prescriptions:  .  aspirin 325 MG tablet, Take 325 mg by mouth daily., Disp: , Rfl:  .  fluticasone (FLONASE) 50 MCG/ACT nasal spray, , Disp: , Rfl:  .  metoprolol succinate (TOPROL-XL) 25 MG 24 hr tablet, Take by mouth., Disp: , Rfl:  .  rivastigmine (EXELON) 9.5 mg/24hr, Place 1 patch (9.5 mg  total) onto the skin daily., Disp: 60 patch, Rfl: 1 .  rosuvastatin (CRESTOR) 20 MG tablet, Take by mouth., Disp: , Rfl:  .  zolpidem (AMBIEN) 10 MG tablet, , Disp: , Rfl:   No Known Allergies  Fall Risk: Fall Risk  10/16/2014  Falls in the past year? Yes  Number falls in past yr: 1  Injury with Fall? No    Depression screen PHQ 2/9 10/16/2014  Decreased Interest 0  Down, Depressed, Hopeless 0  PHQ - 2 Score 0   Functional Status Survey: Is the patient deaf or have difficulty hearing?: Yes Does the patient have difficulty seeing, even when wearing glasses/contacts?: No Does the patient have difficulty concentrating, remembering, or making decisions?: No Does the patient have difficulty walking or climbing stairs?: No Does the patient have difficulty dressing or bathing?: No Does the  patient have difficulty doing errands alone such as visiting a doctor's office or shopping?: No   ROS  CONSTITUTIONAL: No significant weight changes, fever, chills, weakness or fatigue.  HEENT:  - Eyes: No visual changes.  - Ears: No auditory changes. No pain.  - Nose: No sneezing, congestion, runny nose. - Throat: No sore throat. No changes in swallowing. SKIN: No rash or itching.  CARDIOVASCULAR: No chest pain, chest pressure or chest discomfort. No palpitations or edema.  RESPIRATORY: No shortness of breath, cough or sputum.  GASTROINTESTINAL: No anorexia, nausea, vomiting. No changes in bowel habits. No abdominal pain or blood.  GENITOURINARY: No dysuria. No frequency. No discharge.  NEUROLOGICAL: No headache, dizziness, syncope, paralysis, ataxia, numbness or tingling in the extremities. No memory changes. No change in bowel or bladder control.  MUSCULOSKELETAL: No joint pain. No muscle pain. HEMATOLOGIC: No anemia, bleeding or bruising.  LYMPHATICS: No enlarged lymph nodes.  PSYCHIATRIC: No change in mood. No change in sleep pattern.  ENDOCRINOLOGIC: No reports of sweating, cold or heat intolerance. No polyuria or polydipsia.   Objective  Filed Vitals:   10/16/14 0828  BP: 122/74  Pulse: 65  Temp: 97.4 F (36.3 C)  TempSrc: Oral  Resp: 16  Height: 5\' 7"  (1.702 m)  Weight: 155 lb 12.8 oz (70.67 kg)  SpO2: 97%   Body mass index is 24.4 kg/(m^2).  Physical Exam  Constitutional: Patient appears well-developed and well-nourished. In no distress.  Neck: Normal range of motion. Neck supple. No JVD present. No thyromegaly present.  Cardiovascular: Normal rate, regular rhythm and normal heart sounds.  No murmur heard.  Pulmonary/Chest: Effort normal and breath sounds normal. No respiratory distress. Musculoskeletal: Normal range of motion bilateral UE and LE, no joint effusions. Peripheral vascular: Bilateral LE no edema. Neurological: CN II-XII grossly intact with no  focal deficits. Alert and oriented to person, place, and time. Coordination, balance, strength, speech and gait are normal.  Skin: Skin is warm and dry. No rash noted. No erythema.  Psychiatric: Patient has a normal mood and affect. Behavior is normal in office today. Judgment and thought content normal in office today.   Assessment & Plan  1. Medicare annual wellness visit, subsequent  Functional ability/safety issues: No Issues Hearing issues: Addressed  Activities of daily living: Discussed Home safety issues: No Issues  End Of Life Planning: Offered verbal information regarding advanced directives, healthcare power of attorney.  Preventative care, Health maintenance, Preventative health measures discussed.  Preventative screenings discussed today: lab work, colonoscopy, PSA.  Men age 38 to 20 years if ever smoked recommended to get a one time AAA ultrasound screening exam.  Low Dose CT Chest recommended if Age 39-80 years, 30 pack-year currently smoking OR have quit w/in 15years.   Lifestyle risk factor issued reviewed: Diet, exercise, weight management, advised patient smoking is not healthy, nutrition/diet.  Preventative health measures discussed (5-10 year plan).  Reviewed and recommended vaccinations: - Pneumovax  - Prevnar  - Annual Influenza - Zostavax - Tdap   Depression screening: Done Fall risk screening: Done Discuss ADLs/IADLs: Done  Current medical providers: See HPI  Other health risk factors identified this visit: No other issues Cognitive impairment issues: None identified  All above discussed with patient. Appropriate education, counseling and referral will be made based upon the above.   2. Coronary artery disease involving native coronary artery of native heart without angina pectoris Reviewed recent cardiac testing results with him and provided a copy at his request.  3. Status post insertion of drug eluting coronary artery stent Doing well  with optimized medical management.  4. Alzheimer's dementia without behavioral disturbance Stable clinical picture.  - rivastigmine (EXELON) 9.5 mg/24hr; Place 1 patch (9.5 mg total) onto the skin daily.  Dispense: 60 patch; Refill: 1

## 2015-01-09 DIAGNOSIS — H2513 Age-related nuclear cataract, bilateral: Secondary | ICD-10-CM | POA: Diagnosis not present

## 2015-01-23 DIAGNOSIS — H2513 Age-related nuclear cataract, bilateral: Secondary | ICD-10-CM | POA: Diagnosis not present

## 2015-01-24 ENCOUNTER — Encounter: Payer: Self-pay | Admitting: *Deleted

## 2015-01-28 NOTE — Discharge Instructions (Signed)

## 2015-01-29 ENCOUNTER — Ambulatory Visit
Admission: RE | Admit: 2015-01-29 | Discharge: 2015-01-29 | Disposition: A | Discharge status: Home / Self Care | Payer: Medicare Other | Source: Ambulatory Visit | Attending: Ophthalmology | Admitting: Ophthalmology

## 2015-01-29 ENCOUNTER — Encounter
Admission: RE | Disposition: A | Discharge status: Home / Self Care | Payer: Self-pay | Source: Ambulatory Visit | Attending: Ophthalmology

## 2015-01-29 ENCOUNTER — Ambulatory Visit: Payer: Medicare Other | Admitting: Anesthesiology

## 2015-01-29 DIAGNOSIS — H2511 Age-related nuclear cataract, right eye: Secondary | ICD-10-CM | POA: Diagnosis not present

## 2015-01-29 DIAGNOSIS — Z9889 Other specified postprocedural states: Secondary | ICD-10-CM | POA: Insufficient documentation

## 2015-01-29 DIAGNOSIS — E7800 Pure hypercholesterolemia, unspecified: Secondary | ICD-10-CM | POA: Insufficient documentation

## 2015-01-29 DIAGNOSIS — Z79899 Other long term (current) drug therapy: Secondary | ICD-10-CM | POA: Diagnosis not present

## 2015-01-29 DIAGNOSIS — H9190 Unspecified hearing loss, unspecified ear: Secondary | ICD-10-CM | POA: Diagnosis not present

## 2015-01-29 DIAGNOSIS — H269 Unspecified cataract: Secondary | ICD-10-CM | POA: Diagnosis present

## 2015-01-29 DIAGNOSIS — Z955 Presence of coronary angioplasty implant and graft: Secondary | ICD-10-CM | POA: Insufficient documentation

## 2015-01-29 DIAGNOSIS — Z7982 Long term (current) use of aspirin: Secondary | ICD-10-CM | POA: Insufficient documentation

## 2015-01-29 DIAGNOSIS — H2513 Age-related nuclear cataract, bilateral: Secondary | ICD-10-CM | POA: Diagnosis not present

## 2015-01-29 DIAGNOSIS — R413 Other amnesia: Secondary | ICD-10-CM | POA: Diagnosis not present

## 2015-01-29 HISTORY — PX: CATARACT EXTRACTION W/PHACO: SHX586

## 2015-01-29 HISTORY — DX: Presence of external hearing-aid: Z97.4

## 2015-01-29 SURGERY — PHACOEMULSIFICATION, CATARACT, WITH IOL INSERTION
Anesthesia: Monitor Anesthesia Care | Laterality: Right | Wound class: Clean

## 2015-01-29 MED ORDER — TIMOLOL MALEATE 0.5 % OP SOLN
OPHTHALMIC | Status: DC | PRN
  Administered 2015-01-29: 1 [drp] via OPHTHALMIC

## 2015-01-29 MED ORDER — NA HYALUR & NA CHOND-NA HYALUR 0.4-0.35 ML IO KIT
PACK | INTRAOCULAR | Status: DC | PRN
  Administered 2015-01-29: 1 mL via INTRAOCULAR

## 2015-01-29 MED ORDER — MIDAZOLAM HCL 2 MG/2ML IJ SOLN
INTRAMUSCULAR | Status: DC | PRN
  Administered 2015-01-29: 2 mg via INTRAVENOUS

## 2015-01-29 MED ORDER — POVIDONE-IODINE 5 % OP SOLN
1.0000 "application " | OPHTHALMIC | Status: DC | PRN
  Administered 2015-01-29: 1 via OPHTHALMIC

## 2015-01-29 MED ORDER — FENTANYL CITRATE (PF) 100 MCG/2ML IJ SOLN
INTRAMUSCULAR | Status: DC | PRN
  Administered 2015-01-29: 100 ug via INTRAVENOUS

## 2015-01-29 MED ORDER — EPINEPHRINE HCL 1 MG/ML IJ SOLN
INTRAOCULAR | Status: DC | PRN
  Administered 2015-01-29: 90 mL via OPHTHALMIC

## 2015-01-29 MED ORDER — TETRACAINE HCL 0.5 % OP SOLN
1.0000 [drp] | OPHTHALMIC | Status: DC | PRN
  Administered 2015-01-29: 1 [drp] via OPHTHALMIC

## 2015-01-29 MED ORDER — ARMC OPHTHALMIC DILATING GEL
1.0000 | OPHTHALMIC | Status: DC | PRN
Start: 2015-01-29 — End: 2015-01-29
  Administered 2015-01-29 (×2): 1 via OPHTHALMIC

## 2015-01-29 MED ORDER — CEFUROXIME OPHTHALMIC INJECTION 1 MG/0.1 ML
INJECTION | OPHTHALMIC | Status: DC | PRN
  Administered 2015-01-29: 0.1 mL via OPHTHALMIC

## 2015-01-29 MED ORDER — BRIMONIDINE TARTRATE 0.2 % OP SOLN
OPHTHALMIC | Status: DC | PRN
  Administered 2015-01-29: 1 [drp] via OPHTHALMIC

## 2015-01-29 SURGICAL SUPPLY — 25 items

## 2015-01-29 NOTE — Anesthesia Preprocedure Evaluation (Signed)
Anesthesia Evaluation  Patient identified by MRN, date of birth, ID band Patient awake    Reviewed: Allergy & Precautions, NPO status , Patient's Chart, lab work & pertinent test results, reviewed documented beta blocker date and time   History of Anesthesia Complications Negative for: history of anesthetic complications  Airway Mallampati: I  TM Distance: >3 FB Neck ROM: Full    Dental no notable dental hx.    Pulmonary sleep apnea and Continuous Positive Airway Pressure Ventilation ,    Pulmonary exam normal        Cardiovascular hypertension, + CAD  Normal cardiovascular exam  Cardiac cath September 2008: 20% left main stenosis, 40% mid LAD stenosis, 99% ostial first diagonal stenosis in a large branch and 90% ostial disease in second diagonal but the vessel was very small, 20% mid RCA stenosis with normal ejection fraction. Successful angioplasty and drug-eluting stent placement to the ostial first diagonal with a 2.5 x 15 mm Xience stent   Neuro/Psych    GI/Hepatic negative GI ROS, Neg liver ROS,   Endo/Other  negative endocrine ROS  Renal/GU      Musculoskeletal negative musculoskeletal ROS (+)   Abdominal   Peds  Hematology negative hematology ROS (+)   Anesthesia Other Findings   Reproductive/Obstetrics negative OB ROS                             Anesthesia Physical Anesthesia Plan  ASA: II  Anesthesia Plan: MAC   Post-op Pain Management:    Induction: Intravenous  Airway Management Planned:   Additional Equipment:   Intra-op Plan:   Post-operative Plan:   Informed Consent: I have reviewed the patients History and Physical, chart, labs and discussed the procedure including the risks, benefits and alternatives for the proposed anesthesia with the patient or authorized representative who has indicated his/her understanding and acceptance.     Plan Discussed with:  CRNA  Anesthesia Plan Comments:         Anesthesia Quick Evaluation

## 2015-01-29 NOTE — H&P (Signed)
  The History and Physical notes are on paper, have been signed, and are to be scanned. The patient remains stable and unchanged from the H&P.   Previous H&P reviewed, patient examined, and there are no changes.  Vincent Kennedy 01/29/2015 9:58 AM

## 2015-01-29 NOTE — Anesthesia Postprocedure Evaluation (Signed)
  Anesthesia Post-op Note  Patient: Vincent Kennedy  Procedure(s) Performed: Procedure(s) with comments: CATARACT EXTRACTION PHACO AND INTRAOCULAR LENS PLACEMENT (IOC) (Right) - RESTOR LENS CPAP  Anesthesia type:MAC  Patient location: PACU  Post pain: Pain level controlled  Post assessment: Post-op Vital signs reviewed, Patient's Cardiovascular Status Stable, Respiratory Function Stable, Patent Airway and No signs of Nausea or vomiting  Post vital signs: Reviewed and stable  Last Vitals:  Filed Vitals:   01/29/15 1102  BP: 109/72  Pulse: 55  Temp: 36.3 C  Resp: 10    Level of consciousness: awake, alert  and patient cooperative  Complications: No apparent anesthesia complications

## 2015-01-29 NOTE — Anesthesia Procedure Notes (Signed)
Procedure Name: MAC Performed by: Biviana Saddler Pre-anesthesia Checklist: Patient identified, Emergency Drugs available, Suction available, Timeout performed and Patient being monitored Patient Re-evaluated:Patient Re-evaluated prior to inductionOxygen Delivery Method: Nasal cannula Placement Confirmation: positive ETCO2       

## 2015-01-29 NOTE — Transfer of Care (Signed)
Immediate Anesthesia Transfer of Care Note  Patient: Vincent SitesMuhammad A Jungwirth  Procedure(s) Performed: Procedure(s) with comments: CATARACT EXTRACTION PHACO AND INTRAOCULAR LENS PLACEMENT (IOC) (Right) - RESTOR LENS CPAP  Patient Location: PACU  Anesthesia Type: MAC  Level of Consciousness: awake, alert  and patient cooperative  Airway and Oxygen Therapy: Patient Spontanous Breathing and Patient connected to supplemental oxygen  Post-op Assessment: Post-op Vital signs reviewed, Patient's Cardiovascular Status Stable, Respiratory Function Stable, Patent Airway and No signs of Nausea or vomiting  Post-op Vital Signs: Reviewed and stable  Complications: No apparent anesthesia complications

## 2015-01-29 NOTE — Op Note (Signed)
LOCATION:  [facility]   PREOPERATIVE DIAGNOSIS:    Nuclear sclerotic cataract right eye. H25.11   POSTOPERATIVE DIAGNOSIS:  Nuclear sclerotic cataract right eye.     PROCEDURE:  Phacoemusification with posterior chamber intraocular lens placement of the right eye   LENS:   Implant Name Type Inv. Item Serial No. Manufacturer Lot No. LRB No. Used  Restor Multifocal IOL     [id number] ALCON   Right 1     [id number] 18.0 D ReSTor (2.5 Add)   ULTRASOUND TIME: 16 % of 1 minutes, 33 seconds.  CDE 14.9   SURGEON:  Deirdre Evenerhadwick R. Aalani Aikens, MD   ANESTHESIA:  Topical with tetracaine drops and 2% Xylocaine jelly.   COMPLICATIONS:  None.   DESCRIPTION OF PROCEDURE:  The patient was identified in the holding room and transported to the operating room and placed in the supine position under the operating microscope.  The right eye was identified as the operative eye and it was prepped and draped in the usual sterile ophthalmic fashion.   A 1 millimeter clear-corneal paracentesis was made at the 12:00 position.  The anterior chamber was filled with Viscoat viscoelastic.  A 2.4 millimeter keratome was used to make a near-clear corneal incision at the 9:00 position.  A curvilinear capsulorrhexis was made with a cystotome and capsulorrhexis forceps.  Balanced salt solution was used to hydrodissect and hydrodelineate the nucleus.   Phacoemulsification was then used in stop and chop fashion to remove the lens nucleus and epinucleus.  The remaining cortex was then removed using the irrigation and aspiration handpiece. Provisc was then placed into the capsular bag to distend it for lens placement.  A lens was then injected into the capsular bag.  The remaining viscoelastic was aspirated.   Wounds were hydrated with balanced salt solution.  The anterior chamber was inflated to a physiologic pressure with balanced salt solution.  No wound leaks were noted. Cefuroxime 0.1 ml of a 10mg /ml solution was  injected into the anterior chamber for a dose of 1 mg of intracameral antibiotic at the completion of the case.   Timolol and Brimonidine drops were applied to the eye.  The patient was taken to the recovery room in stable condition without complications of anesthesia or surgery.   Rainee Sweatt 01/29/2015, 11:00 AM

## 2015-01-30 ENCOUNTER — Encounter: Payer: Self-pay | Admitting: Ophthalmology

## 2015-03-05 ENCOUNTER — Other Ambulatory Visit: Payer: Self-pay | Admitting: Family Medicine

## 2015-03-06 ENCOUNTER — Encounter: Payer: Self-pay | Admitting: *Deleted

## 2015-03-07 DIAGNOSIS — H2512 Age-related nuclear cataract, left eye: Secondary | ICD-10-CM | POA: Diagnosis not present

## 2015-03-11 NOTE — Discharge Instructions (Signed)

## 2015-03-12 ENCOUNTER — Ambulatory Visit: Payer: Medicare Other | Admitting: Anesthesiology

## 2015-03-12 ENCOUNTER — Ambulatory Visit
Admission: RE | Admit: 2015-03-12 | Discharge: 2015-03-12 | Disposition: A | Discharge status: Home / Self Care | Payer: Medicare Other | Source: Ambulatory Visit | Attending: Ophthalmology | Admitting: Ophthalmology

## 2015-03-12 ENCOUNTER — Encounter
Admission: RE | Disposition: A | Discharge status: Home / Self Care | Payer: Self-pay | Source: Ambulatory Visit | Attending: Ophthalmology

## 2015-03-12 DIAGNOSIS — I10 Essential (primary) hypertension: Secondary | ICD-10-CM | POA: Diagnosis not present

## 2015-03-12 DIAGNOSIS — I25118 Atherosclerotic heart disease of native coronary artery with other forms of angina pectoris: Secondary | ICD-10-CM | POA: Insufficient documentation

## 2015-03-12 DIAGNOSIS — Z955 Presence of coronary angioplasty implant and graft: Secondary | ICD-10-CM | POA: Diagnosis not present

## 2015-03-12 DIAGNOSIS — H2512 Age-related nuclear cataract, left eye: Secondary | ICD-10-CM | POA: Diagnosis not present

## 2015-03-12 DIAGNOSIS — E7800 Pure hypercholesterolemia, unspecified: Secondary | ICD-10-CM | POA: Diagnosis not present

## 2015-03-12 DIAGNOSIS — G4730 Sleep apnea, unspecified: Secondary | ICD-10-CM | POA: Insufficient documentation

## 2015-03-12 HISTORY — PX: CATARACT EXTRACTION W/PHACO: SHX586

## 2015-03-12 SURGERY — PHACOEMULSIFICATION, CATARACT, WITH IOL INSERTION
Anesthesia: Monitor Anesthesia Care | Laterality: Left | Wound class: Clean

## 2015-03-12 MED ORDER — ARMC OPHTHALMIC DILATING GEL
1.0000 "application " | OPHTHALMIC | Status: DC | PRN
  Administered 2015-03-12 (×2): 1 via OPHTHALMIC

## 2015-03-12 MED ORDER — LIDOCAINE HCL (PF) 4 % IJ SOLN
INTRAOCULAR | Status: DC | PRN
  Administered 2015-03-12: 1 mL via OPHTHALMIC

## 2015-03-12 MED ORDER — CEFUROXIME OPHTHALMIC INJECTION 1 MG/0.1 ML
INJECTION | OPHTHALMIC | Status: DC | PRN
  Administered 2015-03-12: 0.1 mL via INTRACAMERAL

## 2015-03-12 MED ORDER — OXYCODONE HCL 5 MG/5ML PO SOLN: 5.0000 mg | Freq: Once | ORAL | Status: DC | PRN

## 2015-03-12 MED ORDER — FENTANYL CITRATE (PF) 100 MCG/2ML IJ SOLN
INTRAMUSCULAR | Status: DC | PRN
  Administered 2015-03-12: 100 ug via INTRAVENOUS

## 2015-03-12 MED ORDER — ACETAMINOPHEN 325 MG PO TABS: 325.0000 mg | ORAL_TABLET | ORAL | Status: DC | PRN

## 2015-03-12 MED ORDER — BRIMONIDINE TARTRATE 0.2 % OP SOLN
OPHTHALMIC | Status: DC | PRN
  Administered 2015-03-12: 1 [drp] via OPHTHALMIC

## 2015-03-12 MED ORDER — ACETAMINOPHEN 160 MG/5ML PO SOLN: 325.0000 mg | ORAL | Status: DC | PRN

## 2015-03-12 MED ORDER — NA HYALUR & NA CHOND-NA HYALUR 0.4-0.35 ML IO KIT
PACK | INTRAOCULAR | Status: DC | PRN
  Administered 2015-03-12: 1 mL via INTRAOCULAR

## 2015-03-12 MED ORDER — TIMOLOL MALEATE 0.5 % OP SOLN
OPHTHALMIC | Status: DC | PRN
  Administered 2015-03-12: 1 [drp] via OPHTHALMIC

## 2015-03-12 MED ORDER — DEXAMETHASONE SODIUM PHOSPHATE 4 MG/ML IJ SOLN: 8.0000 mg | Freq: Once | INTRAMUSCULAR | Status: DC | PRN

## 2015-03-12 MED ORDER — MIDAZOLAM HCL 2 MG/2ML IJ SOLN
INTRAMUSCULAR | Status: DC | PRN
Start: 2015-03-12 — End: 2015-03-12
  Administered 2015-03-12: 2 mg via INTRAVENOUS

## 2015-03-12 MED ORDER — OXYCODONE HCL 5 MG PO TABS: 5.0000 mg | ORAL_TABLET | Freq: Once | ORAL | Status: DC | PRN

## 2015-03-12 MED ORDER — FENTANYL CITRATE (PF) 100 MCG/2ML IJ SOLN: 25.0000 ug | INTRAMUSCULAR | Status: DC | PRN

## 2015-03-12 MED ORDER — TETRACAINE HCL 0.5 % OP SOLN
1.0000 [drp] | OPHTHALMIC | Status: DC | PRN
  Administered 2015-03-12: 1 [drp] via OPHTHALMIC

## 2015-03-12 MED ORDER — POVIDONE-IODINE 5 % OP SOLN
1.0000 "application " | OPHTHALMIC | Status: DC | PRN
  Administered 2015-03-12: 1 via OPHTHALMIC

## 2015-03-12 SURGICAL SUPPLY — 26 items
CANNULA ANT/CHMB 27GA (MISCELLANEOUS) ×2 IMPLANT
CARTRIDGE ABBOTT (MISCELLANEOUS) ×2 IMPLANT
GLOVE SURG LX 7.5 STRW (GLOVE) ×1
GLOVE SURG LX STRL 7.5 STRW (GLOVE) ×1 IMPLANT
GLOVE SURG TRIUMPH 8.0 PF LTX (GLOVE) ×2 IMPLANT
GOWN STRL REUS W/ TWL LRG LVL3 (GOWN DISPOSABLE) ×2 IMPLANT
GOWN STRL REUS W/TWL LRG LVL3 (GOWN DISPOSABLE) ×2
LENS IOL RESTOR 18.0 (Intraocular Lens) ×2 IMPLANT
MARKER SKIN SURG W/RULER VIO (MISCELLANEOUS) ×2 IMPLANT
NDL RETROBULBAR .5 NSTRL (NEEDLE) IMPLANT
NEEDLE FILTER BLUNT 18X 1/2SAF (NEEDLE) ×1
NEEDLE FILTER BLUNT 18X1 1/2 (NEEDLE) ×1 IMPLANT
PACK CATARACT BRASINGTON (MISCELLANEOUS) ×2 IMPLANT
PACK EYE AFTER SURG (MISCELLANEOUS) ×2 IMPLANT
PACK OPTHALMIC (MISCELLANEOUS) ×2 IMPLANT
RING MALYGIN 7.0 (MISCELLANEOUS) IMPLANT
SUT ETHILON 10-0 CS-B-6CS-B-6 (SUTURE)
SUT VICRYL  9 0 (SUTURE)
SUT VICRYL 9 0 (SUTURE) IMPLANT
SUTURE EHLN 10-0 CS-B-6CS-B-6 (SUTURE) IMPLANT
SYR 3ML LL SCALE MARK (SYRINGE) ×2 IMPLANT
SYR 5ML LL (SYRINGE) IMPLANT
SYR TB 1ML LUER SLIP (SYRINGE) ×2 IMPLANT
WATER STERILE IRR 250ML POUR (IV SOLUTION) ×2 IMPLANT
WATER STERILE IRR 500ML POUR (IV SOLUTION) IMPLANT
WIPE NON LINTING 3.25X3.25 (MISCELLANEOUS) ×2 IMPLANT

## 2015-03-12 NOTE — Anesthesia Postprocedure Evaluation (Signed)
Anesthesia Post Note  Patient: Vincent Kennedy  Procedure(s) Performed: Procedure(s) (LRB): CATARACT EXTRACTION PHACO AND INTRAOCULAR LENS PLACEMENT (IOC) (Left)  Patient location during evaluation: PACU Anesthesia Type: MAC Level of consciousness: awake and alert Pain management: pain level controlled Vital Signs Assessment: post-procedure vital signs reviewed and stable Respiratory status: spontaneous breathing, nonlabored ventilation, respiratory function stable and patient connected to nasal cannula oxygen Cardiovascular status: blood pressure returned to baseline and stable Postop Assessment: no signs of nausea or vomiting Anesthetic complications: no    [name] D Fredrick Dray

## 2015-03-12 NOTE — Anesthesia Procedure Notes (Signed)
Procedure Name: MAC Performed by: Frederic Tones Pre-anesthesia Checklist: Patient identified, Emergency Drugs available, Suction available, Timeout performed and Patient being monitored Patient Re-evaluated:Patient Re-evaluated prior to inductionOxygen Delivery Method: Nasal cannula Placement Confirmation: positive ETCO2     

## 2015-03-12 NOTE — H&P (Signed)
  The History and Physical notes are on paper, have been signed, and are to be scanned. The patient remains stable and unchanged from the H&P.   Previous H&P reviewed, patient examined, and there are no changes.  Agueda Houpt 03/12/2015 11:08 AM

## 2015-03-12 NOTE — Anesthesia Preprocedure Evaluation (Addendum)
Anesthesia Evaluation  Patient identified by MRN, date of birth, ID band Patient awake    Reviewed: Allergy & Precautions, H&P , NPO status , Patient's Chart, lab work & pertinent test results, reviewed documented beta blocker date and time   Airway Mallampati: II  TM Distance: >3 FB Neck ROM: full    Dental no notable dental hx.    Pulmonary shortness of breath, sleep apnea ,    Pulmonary exam normal breath sounds clear to auscultation       Cardiovascular Exercise Tolerance: Good hypertension, + CAD   Rhythm:regular Rate:Normal     Neuro/Psych PSYCHIATRIC DISORDERS negative neurological ROS     GI/Hepatic negative GI ROS, Neg liver ROS,   Endo/Other  negative endocrine ROS  Renal/GU negative Renal ROS  negative genitourinary   Musculoskeletal   Abdominal   Peds  Hematology negative hematology ROS (+)   Anesthesia Other Findings   Reproductive/Obstetrics negative OB ROS                            Anesthesia Physical Anesthesia Plan  ASA: III  Anesthesia Plan: MAC   Post-op Pain Management:    Induction:   Airway Management Planned:   Additional Equipment:   Intra-op Plan:   Post-operative Plan:   Informed Consent: I have reviewed the patients History and Physical, chart, labs and discussed the procedure including the risks, benefits and alternatives for the proposed anesthesia with the patient or authorized representative who has indicated his/her understanding and acceptance.     Plan Discussed with: CRNA  Anesthesia Plan Comments:         Anesthesia Quick Evaluation                                  Anesthesia Evaluation  Patient identified by MRN, date of birth, ID band Patient awake    Reviewed: Allergy & Precautions, NPO status , Patient's Chart, lab work & pertinent test results, reviewed documented beta blocker date and time   History of Anesthesia  Complications Negative for: history of anesthetic complications  Airway Mallampati: I  TM Distance: >3 FB Neck ROM: Full    Dental no notable dental hx.    Pulmonary sleep apnea and Continuous Positive Airway Pressure Ventilation ,    Pulmonary exam normal        Cardiovascular hypertension, + CAD  Normal cardiovascular exam  Cardiac cath September 2008: 20% left main stenosis, 40% mid LAD stenosis, 99% ostial first diagonal stenosis in a large branch and 90% ostial disease in second diagonal but the vessel was very small, 20% mid RCA stenosis with normal ejection fraction. Successful angioplasty and drug-eluting stent placement to the ostial first diagonal with a 2.5 x 15 mm Xience stent   Neuro/Psych    GI/Hepatic negative GI ROS, Neg liver ROS,   Endo/Other  negative endocrine ROS  Renal/GU      Musculoskeletal negative musculoskeletal ROS (+)   Abdominal   Peds  Hematology negative hematology ROS (+)   Anesthesia Other Findings   Reproductive/Obstetrics negative OB ROS                             Anesthesia Physical Anesthesia Plan  ASA: II  Anesthesia Plan: MAC   Post-op Pain Management:    Induction: Intravenous  Airway Management Planned:  Additional Equipment:   Intra-op Plan:   Post-operative Plan:   Informed Consent: I have reviewed the patients History and Physical, chart, labs and discussed the procedure including the risks, benefits and alternatives for the proposed anesthesia with the patient or authorized representative who has indicated his/her understanding and acceptance.     Plan Discussed with: CRNA  Anesthesia Plan Comments:         Anesthesia Quick Evaluation

## 2015-03-12 NOTE — Op Note (Signed)
OPERATIVE NOTE  Vincent SitesMuhammad A Tabora [id number] 03/12/2015   PREOPERATIVE DIAGNOSIS:  Nuclear sclerotic cataract left eye. H25.12   POSTOPERATIVE DIAGNOSIS:    Nuclear sclerotic cataract left eye.     PROCEDURE:  Phacoemusification with posterior chamber intraocular lens placement of the left eye   LENS:   Implant Name Type Inv. Item Serial No. Manufacturer Lot No. LRB No. Used  acrysof iq restor Intraocular Lens   [id number]     Left 1     [id number] 18.0 D ReSTOR 2.5 PCIOL   ULTRASOUND TIME: 21  % of 1 minutes 8 seconds, CDE 14.6  SURGEON:  Deirdre Evenerhadwick R. Eleonore Shippee, MD   ANESTHESIA:  Topical with tetracaine drops and 2% Xylocaine jelly, augmented with 1% preservative-free intracameral lidocaine. .   COMPLICATIONS:  None.   DESCRIPTION OF PROCEDURE:  The patient was identified in the holding room and transported to the operating room and placed in the supine position under the operating microscope.  The left eye was identified as the operative eye and it was prepped and draped in the usual sterile ophthalmic fashion.   A 1 millimeter clear-corneal paracentesis was made at the 1:30 position.  0.5 ml of preservative-free 1% lidocaine was injected into the anterior chamber. The anterior chamber was filled with Viscoat viscoelastic.  A 2.4 millimeter keratome was used to make a near-clear corneal incision at the 10:30 position.  .  A curvilinear capsulorrhexis was made with a cystotome and capsulorrhexis forceps.  Balanced salt solution was used to hydrodissect and hydrodelineate the nucleus.   Phacoemulsification was then used in stop and chop fashion to remove the lens nucleus and epinucleus.  The remaining cortex was then removed using the irrigation and aspiration handpiece. Provisc was then placed into the capsular bag to distend it for lens placement.  A lens was then injected into the capsular bag.  The remaining viscoelastic was aspirated.   Wounds were hydrated with balanced salt  solution.  The anterior chamber was inflated to a physiologic pressure with balanced salt solution.  No wound leaks were noted. Cefuroxime 0.1 ml of a 10mg /ml solution was injected into the anterior chamber for a dose of 1 mg of intracameral antibiotic at the completion of the case.   Timolol and Brimonidine drops were applied to the eye.  The patient was taken to the recovery room in stable condition without complications of anesthesia or surgery.  [name] 03/12/2015, 12:41 PM

## 2015-03-12 NOTE — Transfer of Care (Signed)
Immediate Anesthesia Transfer of Care Note  Patient: Vincent Kennedy  Procedure(s) Performed: Procedure(s) with comments: CATARACT EXTRACTION PHACO AND INTRAOCULAR LENS PLACEMENT (IOC) (Left) - RESTOR SHUGARCAINE  Patient Location: PACU  Anesthesia Type: MAC  Level of Consciousness: awake, alert  and patient cooperative  Airway and Oxygen Therapy: Patient Spontanous Breathing and Patient connected to supplemental oxygen  Post-op Assessment: Post-op Vital signs reviewed, Patient's Cardiovascular Status Stable, Respiratory Function Stable, Patent Airway and No signs of Nausea or vomiting  Post-op Vital Signs: Reviewed and stable  Complications: No apparent anesthesia complications

## 2015-03-13 ENCOUNTER — Encounter: Payer: Self-pay | Admitting: Ophthalmology

## 2015-03-15 ENCOUNTER — Other Ambulatory Visit: Payer: Self-pay | Admitting: Cardiovascular Disease

## 2015-03-15 MED ORDER — CARVEDILOL 12.5 MG PO TABS: 12.5000 mg | ORAL_TABLET | Freq: Two times a day (BID) | ORAL | Status: DC

## 2015-03-18 ENCOUNTER — Other Ambulatory Visit
Admission: RE | Admit: 2015-03-18 | Discharge: 2015-03-18 | Disposition: A | Discharge status: Home / Self Care | Payer: Medicare Other | Source: Ambulatory Visit | Attending: Cardiovascular Disease | Admitting: Cardiovascular Disease

## 2015-03-18 ENCOUNTER — Encounter: Payer: Self-pay | Admitting: Cardiovascular Disease

## 2015-03-18 ENCOUNTER — Ambulatory Visit
Admission: RE | Admit: 2015-03-18 | Discharge: 2015-03-18 | Disposition: A | Discharge status: Home / Self Care | Payer: Medicare Other | Source: Ambulatory Visit | Attending: Cardiovascular Disease | Admitting: Cardiovascular Disease

## 2015-03-18 ENCOUNTER — Ambulatory Visit (INDEPENDENT_AMBULATORY_CARE_PROVIDER_SITE_OTHER): Payer: Medicare Other | Admitting: Cardiovascular Disease

## 2015-03-18 ENCOUNTER — Other Ambulatory Visit: Payer: Self-pay

## 2015-03-18 VITALS — BP 118/60 | HR 58 | Ht 68.5 in | Wt 157.5 lb

## 2015-03-18 DIAGNOSIS — I2510 Atherosclerotic heart disease of native coronary artery without angina pectoris: Secondary | ICD-10-CM

## 2015-03-18 DIAGNOSIS — J9601 Acute respiratory failure with hypoxia: Secondary | ICD-10-CM | POA: Diagnosis not present

## 2015-03-18 DIAGNOSIS — R0902 Hypoxemia: Secondary | ICD-10-CM | POA: Diagnosis not present

## 2015-03-18 DIAGNOSIS — M79604 Pain in right leg: Secondary | ICD-10-CM | POA: Diagnosis not present

## 2015-03-18 DIAGNOSIS — E785 Hyperlipidemia, unspecified: Secondary | ICD-10-CM | POA: Diagnosis not present

## 2015-03-18 DIAGNOSIS — R079 Chest pain, unspecified: Secondary | ICD-10-CM

## 2015-03-18 DIAGNOSIS — J849 Interstitial pulmonary disease, unspecified: Secondary | ICD-10-CM | POA: Diagnosis not present

## 2015-03-18 DIAGNOSIS — R0600 Dyspnea, unspecified: Secondary | ICD-10-CM | POA: Insufficient documentation

## 2015-03-18 DIAGNOSIS — R0602 Shortness of breath: Secondary | ICD-10-CM

## 2015-03-18 DIAGNOSIS — Z955 Presence of coronary angioplasty implant and graft: Secondary | ICD-10-CM | POA: Diagnosis not present

## 2015-03-18 DIAGNOSIS — R55 Syncope and collapse: Secondary | ICD-10-CM | POA: Diagnosis not present

## 2015-03-18 DIAGNOSIS — J84115 Respiratory bronchiolitis interstitial lung disease: Secondary | ICD-10-CM | POA: Diagnosis not present

## 2015-03-18 DIAGNOSIS — M79605 Pain in left leg: Secondary | ICD-10-CM | POA: Diagnosis not present

## 2015-03-18 DIAGNOSIS — Z79899 Other long term (current) drug therapy: Secondary | ICD-10-CM | POA: Diagnosis not present

## 2015-03-18 DIAGNOSIS — J8410 Pulmonary fibrosis, unspecified: Secondary | ICD-10-CM | POA: Diagnosis not present

## 2015-03-18 DIAGNOSIS — Z961 Presence of intraocular lens: Secondary | ICD-10-CM | POA: Diagnosis not present

## 2015-03-18 DIAGNOSIS — J984 Other disorders of lung: Secondary | ICD-10-CM | POA: Diagnosis not present

## 2015-03-18 DIAGNOSIS — I10 Essential (primary) hypertension: Secondary | ICD-10-CM | POA: Diagnosis not present

## 2015-03-18 DIAGNOSIS — G4730 Sleep apnea, unspecified: Secondary | ICD-10-CM | POA: Diagnosis not present

## 2015-03-18 DIAGNOSIS — Z7982 Long term (current) use of aspirin: Secondary | ICD-10-CM | POA: Diagnosis not present

## 2015-03-18 DIAGNOSIS — K219 Gastro-esophageal reflux disease without esophagitis: Secondary | ICD-10-CM | POA: Diagnosis not present

## 2015-03-18 DIAGNOSIS — Z8249 Family history of ischemic heart disease and other diseases of the circulatory system: Secondary | ICD-10-CM | POA: Diagnosis not present

## 2015-03-18 DIAGNOSIS — J9611 Chronic respiratory failure with hypoxia: Secondary | ICD-10-CM | POA: Diagnosis not present

## 2015-03-18 LAB — BASIC METABOLIC PANEL
Anion gap: 6 (ref 5–15)
BUN: 20 mg/dL (ref 6–20)
CALCIUM: 8.9 mg/dL (ref 8.9–10.3)
CO2: 28 mmol/L (ref 22–32)
Chloride: 107 mmol/L (ref 101–111)
Creatinine, Ser: 0.85 mg/dL (ref 0.61–1.24)
GFR calc Af Amer: >60 mL/min (ref >60)
GLUCOSE: 81 mg/dL (ref 65–99)
Potassium: 4.5 mmol/L (ref 3.5–5.1)
SODIUM: 141 mmol/L (ref 135–145)

## 2015-03-18 LAB — CBC WITH DIFFERENTIAL/PLATELET
BASOS ABS: 0.1 10*3/uL (ref 0–0.1)
Basophils Relative: 1 %
EOS ABS: 0.7 10*3/uL (ref 0–0.7)
EOS PCT: 9 %
HCT: 42.9 % (ref 40.0–52.0)
Hemoglobin: 14.4 g/dL (ref 13.0–18.0)
LYMPHS ABS: 2 10*3/uL (ref 1.0–3.6)
LYMPHS PCT: 24 %
MCH: 29.7 pg (ref 26.0–34.0)
MCHC: 33.5 g/dL (ref 32.0–36.0)
MCV: 88.6 fL (ref 80.0–100.0)
MONO ABS: 0.7 10*3/uL (ref 0.2–1.0)
Monocytes Relative: 8 %
Neutro Abs: 4.9 10*3/uL (ref 1.4–6.5)
Neutrophils Relative %: 58 %
PLATELETS: 224 10*3/uL (ref 150–440)
RBC: 4.84 MIL/uL (ref 4.40–5.90)
RDW: 14.2 % (ref 11.5–14.5)
WBC: 8.3 10*3/uL (ref 3.8–10.6)

## 2015-03-18 LAB — FIBRIN DERIVATIVES D-DIMER (ARMC ONLY): Fibrin derivatives D-dimer (ARMC): 741 — ABNORMAL HIGH (ref 0–499)

## 2015-03-18 LAB — TROPONIN I: Troponin I: <0.03 ng/mL (ref <0.031)

## 2015-03-18 LAB — BRAIN NATRIURETIC PEPTIDE: B Natriuretic Peptide: 49 pg/mL (ref 0.0–100.0)

## 2015-03-18 MED ORDER — PANTOPRAZOLE SODIUM 40 MG PO TBEC: 40.0000 mg | DELAYED_RELEASE_TABLET | Freq: Every day | ORAL | Status: DC

## 2015-03-18 NOTE — Assessment & Plan Note (Signed)
Continue medical therapy for this. His EKG is normal.

## 2015-03-18 NOTE — Assessment & Plan Note (Signed)
These symptoms seem to be new over the last few months and are different from his prior angina. In addition, he did have a nuclear stress test in March of this year which was unremarkable. His EKG today is normal. His lung exam is not normal but other than that I don't see signs of congestive heart failure. His previous chest x-ray was abnormal in June. I ordered labs today including CBC, basic metabolic profile, BNP, d-dimer and troponin given that he had one episode of vague chest discomfort yesterday. I requested an echocardiogram for evaluation as well as a chest x-ray. He might require pulmonary evaluation.

## 2015-03-18 NOTE — Progress Notes (Signed)
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 In 2008 when he presented with unstable angina. Cardiac catheterization showed 99% ostial first diagonal stenosis as well as mild left main and LAD disease. He underwent successful angioplasty and drug-eluting stent placement to the ostial first diagonal. He has not had any cardiac events since then. He was on dual antiplatelet therapy for one year. He has known history of hyperlipidemia.  Most recent nuclear stress test in March 2016 was normal. He is here on an urgent basis due to recent symptoms of increased exertional dyspnea. He reports that his blood pressure has been running high recently in spite of taking metoprolol. I spoke with him over the weekend and decided to switch him to carvedilol which she started yesterday. He was shopping with his wife yesterday in [place] but felt very fatigued with a funny feeling in his chest. He has been having increased heartburn and has been taking Tums. No orthopnea or PND. Previous chest x-ray in June showed prominent diffuse interstitial pattern of unclear etiology. It doesn't seem that he had evaluation for that.   No Known Allergies   Current Outpatient Prescriptions on File Prior to Visit  Medication Sig Dispense Refill  . aspirin 325 MG tablet Take 325 mg by mouth daily.    . carvedilol (COREG) 12.5 MG tablet Take 1 tablet (12.5 mg total) by mouth 2 (two) times daily. 60 tablet 6  . fluticasone (FLONASE) 50 MCG/ACT nasal spray Reported on 03/12/2015    . rivastigmine (EXELON) 9.5 mg/24hr APPLY ONE PATCH TO SKIN AND CHANGE DAILY 60 patch 2  . rosuvastatin (CRESTOR) 20 MG tablet Take by mouth.    . zolpidem (AMBIEN) 10 MG tablet Take 10 mg by mouth at bedtime as needed.      No current facility-administered medications on file prior to visit.     Past Medical History  Diagnosis Date  . Coronary artery  disease     Cardiac cath September 2008: 20% left main stenosis, 40% mid LAD stenosis, 99% ostial first diagonal stenosis in a large branch and 90% ostial disease in second diagonal but the vessel was very small, 20% mid RCA stenosis with normal ejection fraction. Successful angioplasty and drug-eluting stent placement to the ostial first diagonal with a 2.5 x 15 mm Xience stent  . Hyperlipidemia   . Abnormal nuclear cardiac imaging test     Dr. [name]  . Cataract   . Sleep apnea     has not used CPAP recently.  Feels condition has improved.  . Hypertension     BP has never been high(per pt).  Metoprolol is to keep BP low because of stent (afterload)  . Shortness of breath dyspnea     with exertion  . Wears hearing aid     (sometimes)     Past Surgical History  Procedure Laterality Date  . Cardiac catheterization      2010  . Coronary angioplasty  2010  . Hernia repair Bilateral     inguinal  . Colonoscopy with esophagogastroduodenoscopy (egd)    . Cataract extraction w/phaco Right 01/29/2015    Procedure: CATARACT EXTRACTION PHACO AND INTRAOCULAR LENS PLACEMENT (IOC);  Surgeon: Lockie Mola, MD;  Location: [facility];  Service: Ophthalmology;  Laterality: Right;  RESTOR LENS CPAP  . Cataract extraction w/phaco Left 03/12/2015    Procedure: CATARACT EXTRACTION PHACO AND INTRAOCULAR LENS PLACEMENT (  IOC);  Surgeon: [name], MD;  [facility];  Service: Ophthalmology;  Laterality: Left;  RESTOR SHUGARCAINE     Family History  Problem Relation Age of Onset  . Family history unknown: Yes     Social History   Social History  . Marital Status: Married    Spouse Name: N/A  . Number of Children: N/A  . Years of Education: N/A   Occupational History  . retired    Social History Main Topics  . Smoking status: Never Smoker   . Smokeless tobacco: Not on file  . Alcohol Use: No  . Drug Use: No  . Sexual Activity: Yes     Comment:  patient states minor   Other Topics Concern  . Not on file   Social History Narrative       PHYSICAL EXAM   BP 118/60 mmHg  Pulse 58  Ht 5' 8.5" (1.74 m)  Wt 157 lb 8 oz (71.442 kg)  BMI 23.60 kg/m2 Constitutional: He is oriented to person, place, and time. He appears well-developed and well-nourished. No distress.  HENT: No nasal discharge.  Head: Normocephalic and atraumatic.  Eyes: Pupils are equal and round. Right eye exhibits no discharge. Left eye exhibits no discharge.  Neck: Normal range of motion. Neck supple. No JVD present. No thyromegaly present.  Cardiovascular: Normal rate, regular rhythm, normal heart sounds and. Exam reveals no gallop and no friction rub. There is a 1/6 systolic ejection murmur at the aortic area. Pulmonary/Chest: Effort normal . Diffuse bibasilar crackles.  Abdominal: Soft. Bowel sounds are normal. He exhibits no distension. There is no tenderness. There is no rebound and no guarding.  Musculoskeletal: Normal range of motion. He exhibits no edema and no tenderness.  Neurological: He is alert and oriented to person, place, and time. Coordination normal.  Skin: Skin is warm and dry. No rash noted. He is not diaphoretic. No erythema. No pallor.  Psychiatric: He has a normal mood and affect. His behavior is normal. Judgment and thought content normal.       EKG: Sinus bradycardia with no significant ST changes.   ASSESSMENT AND PLAN

## 2015-03-18 NOTE — Assessment & Plan Note (Signed)
Lab Results  Component Value Date   CHOL 122 07/23/2013   HDL 39* 07/23/2013   LDLCALC 72 07/23/2013   TRIG 55 07/23/2013   CHOLHDL 3.1 07/23/2013   Continue current dose of rosuvastatin.

## 2015-03-18 NOTE — Patient Instructions (Addendum)
Medication Instructions:  Your physician has recommended you make the following change in your medication:  START taking protonix 40mg  daily   Labwork: STAT: CBC, BMET, d dimer, troponin   Testing/Procedures: Your physician has requested that you have an echocardiogram. Echocardiography is a painless test that uses sound waves to create images of your heart. It provides your doctor with information about the size and shape of your heart and how well your heart's chambers and valves are working. This procedure takes approximately one hour. There are no restrictions for this procedure.  A STAT chest x-ray takes a picture of the organs and structures inside the chest, including the heart, lungs, and blood vessels. This test can show several things, including, whether the heart is enlarges; whether fluid is building up in the lungs; and whether pacemaker / defibrillator leads are still in place.   Follow-Up: Your physician recommends that you schedule a follow-up appointment with Dr. Kirke CorinArida after echo.   Any Other Special Instructions Will Be Listed Below (If Applicable).     If you need a refill on your cardiac medications before your next appointment, please call your pharmacy.  Echocardiogram An echocardiogram, or echocardiography, uses sound waves (ultrasound) to produce an image of your heart. The echocardiogram is simple, painless, obtained within a short period of time, and offers valuable information to your health care provider. The images from an echocardiogram can provide information such as:  Evidence of coronary artery disease (CAD).  Heart size.  Heart muscle function.  Heart valve function.  Aneurysm detection.  Evidence of a past heart attack.  Fluid buildup around the heart.  Heart muscle thickening.  Assess heart valve function. LET [facility]OUR HEALTH CARE PROVIDER KNOW ABOUT:  Any allergies you have.  All medicines you are taking, including vitamins, herbs, eye  drops, creams, and over-the-counter medicines.  Previous problems you or members of your family have had with the use of anesthetics.  Any blood disorders you have.  Previous surgeries you have had.  Medical conditions you have.  Possibility of pregnancy, if this applies. BEFORE THE PROCEDURE  No special preparation is needed. Eat and drink normally.  PROCEDURE   In order to produce an image of your heart, gel will be applied to your chest and a wand-like tool (transducer) will be moved over your chest. The gel will help transmit the sound waves from the transducer. The sound waves will harmlessly bounce off your heart to allow the heart images to be captured in real-time motion. These images will then be recorded.  You may need an IV to receive a medicine that improves the quality of the pictures. AFTER THE PROCEDURE You may return to your normal schedule including diet, activities, and medicines, unless your health care provider tells you otherwise.   This information is not intended to replace advice given to you by your health care provider. Make sure you discuss any questions you have with your health care provider.   Document Released: 03/05/2000 Document Revised: 03/29/2014 Document Reviewed: 11/13/2012 Elsevier Interactive Patient Education [employer]2016 [employer].

## 2015-03-19 ENCOUNTER — Telehealth: Payer: Self-pay | Admitting: *Deleted

## 2015-03-19 ENCOUNTER — Ambulatory Visit
Admission: RE | Admit: 2015-03-19 | Discharge: 2015-03-19 | Disposition: A | Discharge status: Home / Self Care | Payer: Medicare Other | Source: Ambulatory Visit | Attending: Cardiovascular Disease | Admitting: Cardiovascular Disease

## 2015-03-19 ENCOUNTER — Telehealth: Payer: Self-pay

## 2015-03-19 ENCOUNTER — Other Ambulatory Visit: Payer: Self-pay

## 2015-03-19 DIAGNOSIS — R931 Abnormal findings on diagnostic imaging of heart and coronary circulation: Secondary | ICD-10-CM

## 2015-03-19 DIAGNOSIS — R0602 Shortness of breath: Secondary | ICD-10-CM | POA: Diagnosis not present

## 2015-03-19 DIAGNOSIS — R7989 Other specified abnormal findings of blood chemistry: Secondary | ICD-10-CM

## 2015-03-19 MED ORDER — IOHEXOL 350 MG/ML SOLN
80.0000 mL | Freq: Once | INTRAVENOUS | Status: AC | PRN
  Administered 2015-03-19: 80 mL via INTRAVENOUS

## 2015-03-19 NOTE — Telephone Encounter (Signed)
Reviewed results of CT angio w/pt. Per Dr. Kirke CorinArida, refer to pulmonary for evaluation. Pt agreeable w/plan. Appt Jan 26, 10:45am w/Dr. Sung AmabileSimonds.

## 2015-03-19 NOTE — Telephone Encounter (Signed)
S/w pt regarding lab results and MD order for CT angio PE. Pt agreeable w/plan and will go to [facility] now

## 2015-03-19 NOTE — Telephone Encounter (Signed)
Dr. Clydie BraunBhatti called again regarding the CT. Please call patient.

## 2015-03-19 NOTE — Telephone Encounter (Signed)
Per verbal from Dr. Kirke CorinArida, pt needs PPD ASAP to rule out TB. S/w Dr. Sherley BoundsSundaram, pt PCP, who is agreeable to having pt come to office today for test. S/w pt who is agreeable w/plan and will have PPD today and read on Friday afternoon.

## 2015-03-19 NOTE — Telephone Encounter (Signed)
Pt calling stating he was seen yesterday by Dr Kirke CorinArida and he told him that he would need a CT of the chest and he would like to talk to someone about scheduling this. Please call patient.

## 2015-03-20 ENCOUNTER — Other Ambulatory Visit
Admission: RE | Admit: 2015-03-20 | Discharge: 2015-03-20 | Disposition: A | Discharge status: Home / Self Care | Payer: Medicare Other | Source: Ambulatory Visit | Attending: Internal Medicine | Admitting: Internal Medicine

## 2015-03-20 ENCOUNTER — Inpatient Hospital Stay
Admission: AD | Admit: 2015-03-20 | Discharge: 2015-03-22 | DRG: 197 | Disposition: A | Discharge status: Home / Self Care | Payer: Medicare Other | Source: Ambulatory Visit | Attending: Internal Medicine | Admitting: Internal Medicine

## 2015-03-20 ENCOUNTER — Other Ambulatory Visit: Payer: Self-pay | Admitting: Internal Medicine

## 2015-03-20 ENCOUNTER — Other Ambulatory Visit
Admission: RE | Admit: 2015-03-20 | Discharge: 2015-03-20 | Disposition: A | Discharge status: Home / Self Care | Payer: Medicare Other | Source: Ambulatory Visit | Attending: Physician Assistant | Admitting: Physician Assistant

## 2015-03-20 DIAGNOSIS — M79605 Pain in left leg: Secondary | ICD-10-CM | POA: Diagnosis not present

## 2015-03-20 DIAGNOSIS — K219 Gastro-esophageal reflux disease without esophagitis: Secondary | ICD-10-CM | POA: Diagnosis present

## 2015-03-20 DIAGNOSIS — M79604 Pain in right leg: Secondary | ICD-10-CM

## 2015-03-20 DIAGNOSIS — Z961 Presence of intraocular lens: Secondary | ICD-10-CM | POA: Diagnosis present

## 2015-03-20 DIAGNOSIS — Z955 Presence of coronary angioplasty implant and graft: Secondary | ICD-10-CM

## 2015-03-20 DIAGNOSIS — I2510 Atherosclerotic heart disease of native coronary artery without angina pectoris: Secondary | ICD-10-CM | POA: Diagnosis present

## 2015-03-20 DIAGNOSIS — IMO0001 Reserved for inherently not codable concepts without codable children: Secondary | ICD-10-CM

## 2015-03-20 DIAGNOSIS — Z8249 Family history of ischemic heart disease and other diseases of the circulatory system: Secondary | ICD-10-CM | POA: Diagnosis not present

## 2015-03-20 DIAGNOSIS — Z79899 Other long term (current) drug therapy: Secondary | ICD-10-CM

## 2015-03-20 DIAGNOSIS — J8410 Pulmonary fibrosis, unspecified: Secondary | ICD-10-CM | POA: Diagnosis present

## 2015-03-20 DIAGNOSIS — G4730 Sleep apnea, unspecified: Secondary | ICD-10-CM | POA: Diagnosis present

## 2015-03-20 DIAGNOSIS — Z7982 Long term (current) use of aspirin: Secondary | ICD-10-CM | POA: Diagnosis not present

## 2015-03-20 DIAGNOSIS — E785 Hyperlipidemia, unspecified: Secondary | ICD-10-CM | POA: Diagnosis present

## 2015-03-20 DIAGNOSIS — I10 Essential (primary) hypertension: Secondary | ICD-10-CM | POA: Diagnosis present

## 2015-03-20 DIAGNOSIS — J9611 Chronic respiratory failure with hypoxia: Secondary | ICD-10-CM | POA: Diagnosis present

## 2015-03-20 DIAGNOSIS — R0600 Dyspnea, unspecified: Secondary | ICD-10-CM | POA: Diagnosis not present

## 2015-03-20 DIAGNOSIS — R55 Syncope and collapse: Secondary | ICD-10-CM | POA: Diagnosis not present

## 2015-03-20 DIAGNOSIS — R0902 Hypoxemia: Secondary | ICD-10-CM | POA: Diagnosis not present

## 2015-03-20 DIAGNOSIS — J9601 Acute respiratory failure with hypoxia: Secondary | ICD-10-CM | POA: Diagnosis present

## 2015-03-20 DIAGNOSIS — J984 Other disorders of lung: Secondary | ICD-10-CM | POA: Diagnosis present

## 2015-03-20 LAB — COMPREHENSIVE METABOLIC PANEL
ALT: 14 U/L — AB (ref 17–63)
AST: 23 U/L (ref 15–41)
Albumin: 3.5 g/dL (ref 3.5–5.0)
Alkaline Phosphatase: 62 U/L (ref 38–126)
Anion gap: 4 — ABNORMAL LOW (ref 5–15)
BILIRUBIN TOTAL: 0.9 mg/dL (ref 0.3–1.2)
BUN: 16 mg/dL (ref 6–20)
CO2: 28 mmol/L (ref 22–32)
CREATININE: 0.81 mg/dL (ref 0.61–1.24)
Calcium: 9 mg/dL (ref 8.9–10.3)
Chloride: 106 mmol/L (ref 101–111)
GFR calc Af Amer: >60 mL/min (ref >60)
Glucose, Bld: 98 mg/dL (ref 65–99)
POTASSIUM: 3.8 mmol/L (ref 3.5–5.1)
Sodium: 138 mmol/L (ref 135–145)
TOTAL PROTEIN: 7.7 g/dL (ref 6.5–8.1)

## 2015-03-20 LAB — BLOOD GAS, ARTERIAL
ACID-BASE DEFICIT: 1.6 mmol/L (ref 0.0–2.0)
BICARBONATE: 21.7 meq/L (ref 21.0–28.0)
FIO2: 0.21
O2 Saturation: 93.7 %
PATIENT TEMPERATURE: 37
PO2 ART: 67 mmHg — AB (ref 83.0–108.0)
pCO2 arterial: 32 mmHg (ref 32.0–48.0)
pH, Arterial: 7.44 (ref 7.350–7.450)

## 2015-03-20 LAB — CBC
HEMATOCRIT: 40.2 % (ref 40.0–52.0)
Hemoglobin: 13.5 g/dL (ref 13.0–18.0)
MCH: 29 pg (ref 26.0–34.0)
MCHC: 33.5 g/dL (ref 32.0–36.0)
MCV: 86.5 fL (ref 80.0–100.0)
Platelets: 225 10*3/uL (ref 150–440)
RBC: 4.64 MIL/uL (ref 4.40–5.90)
RDW: 14.2 % (ref 11.5–14.5)
WBC: 7.4 10*3/uL (ref 3.8–10.6)

## 2015-03-20 LAB — PROTIME-INR
INR: 1.09
Prothrombin Time: 14.3 seconds (ref 11.4–15.0)

## 2015-03-20 LAB — FIBRIN DERIVATIVES D-DIMER (ARMC ONLY): Fibrin derivatives D-dimer (ARMC): 699.56 — ABNORMAL HIGH (ref 0–499)

## 2015-03-20 LAB — SEDIMENTATION RATE: Sed Rate: 44 mm/hr — ABNORMAL HIGH (ref 0–20)

## 2015-03-20 LAB — APTT: APTT: 29 s (ref 24–36)

## 2015-03-20 MED ORDER — PANTOPRAZOLE SODIUM 40 MG PO TBEC
40.0000 mg | DELAYED_RELEASE_TABLET | Freq: Every day | ORAL | Status: DC
  Administered 2015-03-21 – 2015-03-22 (×2): 40 mg via ORAL
  Filled 2015-03-20 (×2): qty 1

## 2015-03-20 MED ORDER — IPRATROPIUM-ALBUTEROL 0.5-2.5 (3) MG/3ML IN SOLN
3.0000 mL | Freq: Four times a day (QID) | RESPIRATORY_TRACT | Status: DC
  Administered 2015-03-20 – 2015-03-22 (×6): 3 mL via RESPIRATORY_TRACT
  Filled 2015-03-20 (×6): qty 3

## 2015-03-20 MED ORDER — ROSUVASTATIN CALCIUM 20 MG PO TABS
20.0000 mg | ORAL_TABLET | Freq: Every day | ORAL | Status: DC
  Administered 2015-03-20 – 2015-03-21 (×2): 20 mg via ORAL
  Filled 2015-03-20 (×2): qty 1

## 2015-03-20 MED ORDER — BUDESONIDE 0.25 MG/2ML IN SUSP
0.2500 mg | Freq: Two times a day (BID) | RESPIRATORY_TRACT | Status: DC
  Administered 2015-03-20 – 2015-03-22 (×4): 0.25 mg via RESPIRATORY_TRACT
  Filled 2015-03-20 (×4): qty 2

## 2015-03-20 MED ORDER — RIVASTIGMINE 9.5 MG/24HR TD PT24
9.5000 mg | MEDICATED_PATCH | Freq: Every day | TRANSDERMAL | Status: DC
  Administered 2015-03-21 – 2015-03-22 (×2): 9.5 mg via TRANSDERMAL
  Filled 2015-03-20 (×4): qty 1

## 2015-03-20 MED ORDER — AZITHROMYCIN 250 MG PO TABS
500.0000 mg | ORAL_TABLET | Freq: Every day | ORAL | Status: AC
  Administered 2015-03-20: 500 mg via ORAL
  Filled 2015-03-20: qty 2

## 2015-03-20 MED ORDER — AZITHROMYCIN 250 MG PO TABS
250.0000 mg | ORAL_TABLET | Freq: Every day | ORAL | Status: DC
Start: 2015-03-21 — End: 2015-03-22
  Administered 2015-03-21 – 2015-03-22 (×2): 250 mg via ORAL
  Filled 2015-03-20 (×2): qty 1

## 2015-03-20 MED ORDER — MOXIFLOXACIN HCL 0.5 % OP SOLN
1.0000 [drp] | Freq: Four times a day (QID) | OPHTHALMIC | Status: DC
  Administered 2015-03-20 – 2015-03-22 (×8): 1 [drp] via OPHTHALMIC
  Filled 2015-03-20: qty 3

## 2015-03-20 MED ORDER — FLUTICASONE PROPIONATE 50 MCG/ACT NA SUSP
2.0000 | Freq: Every day | NASAL | Status: DC
  Administered 2015-03-21 – 2015-03-22 (×2): 2 via NASAL
  Filled 2015-03-20: qty 16

## 2015-03-20 MED ORDER — ENOXAPARIN SODIUM 40 MG/0.4ML ~~LOC~~ SOLN
40.0000 mg | SUBCUTANEOUS | Status: DC
  Administered 2015-03-20 – 2015-03-21 (×2): 40 mg via SUBCUTANEOUS
  Filled 2015-03-20 (×2): qty 0.4

## 2015-03-20 MED ORDER — ZOLPIDEM TARTRATE 5 MG PO TABS
5.0000 mg | ORAL_TABLET | Freq: Every evening | ORAL | Status: DC | PRN
  Administered 2015-03-20 – 2015-03-22 (×2): 5 mg via ORAL
  Filled 2015-03-20 (×2): qty 1

## 2015-03-20 MED ORDER — ACETAMINOPHEN 650 MG RE SUPP: 650.0000 mg | Freq: Four times a day (QID) | RECTAL | Status: DC | PRN

## 2015-03-20 MED ORDER — CARVEDILOL 12.5 MG PO TABS
12.5000 mg | ORAL_TABLET | Freq: Two times a day (BID) | ORAL | Status: DC
  Administered 2015-03-20 – 2015-03-22 (×4): 12.5 mg via ORAL
  Filled 2015-03-20 (×4): qty 1

## 2015-03-20 MED ORDER — KETOROLAC TROMETHAMINE 0.5 % OP SOLN
1.0000 [drp] | Freq: Two times a day (BID) | OPHTHALMIC | Status: DC
  Administered 2015-03-20 – 2015-03-22 (×4): 1 [drp] via OPHTHALMIC
  Filled 2015-03-20: qty 5

## 2015-03-20 MED ORDER — DEXTROSE 5 % IV SOLN
1.0000 g | INTRAVENOUS | Status: DC
  Administered 2015-03-20 – 2015-03-21 (×2): 1 g via INTRAVENOUS
  Filled 2015-03-20 (×3): qty 10

## 2015-03-20 MED ORDER — METHYLPREDNISOLONE SODIUM SUCC 125 MG IJ SOLR
60.0000 mg | Freq: Four times a day (QID) | INTRAMUSCULAR | Status: DC
  Administered 2015-03-20 – 2015-03-21 (×4): 60 mg via INTRAVENOUS
  Filled 2015-03-20 (×4): qty 2

## 2015-03-20 MED ORDER — SODIUM CHLORIDE 0.9 % IJ SOLN
3.0000 mL | Freq: Two times a day (BID) | INTRAMUSCULAR | Status: DC
  Administered 2015-03-20 – 2015-03-22 (×4): 3 mL via INTRAVENOUS

## 2015-03-20 MED ORDER — ACETAMINOPHEN 325 MG PO TABS: 650.0000 mg | ORAL_TABLET | Freq: Four times a day (QID) | ORAL | Status: DC | PRN

## 2015-03-20 NOTE — H&P (Signed)
[facility] - [place] at [facility]   PATIENT NAME: Vincent Kennedy    MR#:  [id number]  DATE OF BIRTH:  [birth year]  DATE OF ADMISSION:  03/20/2015  PRIMARY CARE PHYSICIAN: Dr. Sherley Bounds  REQUESTING/REFERRING PHYSICIAN: Freda Munro  CHIEF COMPLAINT:  Sent in for direct admission for interstitial lung disease and hypoxia with ambulation  HISTORY OF PRESENT ILLNESS:  Vincent Kennedy  is a 81 y.o. male with a known history of coronary artery disease with stent back in 2008, hyperlipidemia, gastroesophageal reflux disease. For the past 2 months he's been having shortness of breath with activity or going upstairs. 34 days ago he was very tired achy feeling which lasted about 3 or 4 hours while he was out. His lips turned blue at that time. He also has had some coughing which is been nonproductive. One week ago he did have a fall outside on some icy steps. Back in March 2016 he had a normal stress test by Dr. [name]. He had a CT scan of the chest which was negative for pulmonary embolism but showed honeycombing in the lungs for possible interstitial lung disease. He saw [name] today in the office who did an exercise test and found that he became hypoxic with activity. The patient was sent in for direct admission.  PAST MEDICAL HISTORY:   Past Medical History  Diagnosis Date  . Coronary artery disease     Cardiac cath September 2008: 20% left main stenosis, 40% mid LAD stenosis, 99% ostial first diagonal stenosis in a large branch and 90% ostial disease in second diagonal but the vessel was very small, 20% mid RCA stenosis with normal ejection fraction. Successful angioplasty and drug-eluting stent placement to the ostial first diagonal with a 2.5 x 15 mm Xience stent  . Hyperlipidemia   . Abnormal nuclear cardiac imaging test     Dr. [name]  . Cataract   . Sleep apnea     has not used CPAP recently.  Feels condition has improved.  . Hypertension     BP has never  been high(per pt).  Metoprolol is to keep BP low because of stent (afterload)  . Shortness of breath dyspnea     with exertion  . Wears hearing aid     (sometimes)    PAST SURGICAL HISTORY:   Past Surgical History  Procedure Laterality Date  . Cardiac catheterization      2010  . Coronary angioplasty  2010  . Hernia repair Bilateral     inguinal  . Colonoscopy with esophagogastroduodenoscopy (egd)    . Cataract extraction w/phaco Right 01/29/2015    Procedure: CATARACT EXTRACTION PHACO AND INTRAOCULAR LENS PLACEMENT (IOC);  Surgeon: Lockie Mola, MD;  Location: [facility];  Service: Ophthalmology;  Laterality: Right;  RESTOR LENS CPAP  . Cataract extraction w/phaco Left 03/12/2015    Procedure: CATARACT EXTRACTION PHACO AND INTRAOCULAR LENS PLACEMENT (IOC);  Surgeon: Lockie Mola, MD;  Location: [facility];  Service: Ophthalmology;  Laterality: Left;  RESTOR SHUGARCAINE    SOCIAL HISTORY:   Social History  Substance Use Topics  . Smoking status: Never Smoker   . Smokeless tobacco: Not on file  . Alcohol Use: No    FAMILY HISTORY:   Family History  Problem Relation Age of Onset  . CAD Father   . CAD Brother     DRUG ALLERGIES:  No Known Allergies  REVIEW OF SYSTEMS:  CONSTITUTIONAL: No fever, positive for fatigue. Weight loss but trying  EYES: No blurred or double vision.  EARS, NOSE, AND THROAT: No tinnitus or ear pain. No sore throat RESPIRATORY: Positive for cough and shortness of breath, no wheezing or hemoptysis.  CARDIOVASCULAR: No chest pain, orthopnea, edema.  GASTROINTESTINAL: Positive for nausea, no vomiting, diarrhea or abdominal pain. No blood in bowel movements GENITOURINARY: No dysuria, hematuria.  ENDOCRINE: No polyuria, nocturia,  HEMATOLOGY: No anemia, easy bruising or bleeding SKIN: No rash or lesion. MUSCULOSKELETAL: No joint pain or arthritis.   NEUROLOGIC: No tingling, numbness, weakness.  PSYCHIATRY: No  anxiety or depression.   MEDICATIONS AT HOME:   Prior to Admission medications   Medication Sig Start Date End Date Taking? Authorizing Provider  aspirin 325 MG tablet Take 325 mg by mouth daily.    Historical Provider, MD  carvedilol (COREG) 12.5 MG tablet Take 1 tablet (12.5 mg total) by mouth 2 (two) times daily. 03/15/15   Wellington Hampshire, MD  fluticasone Asencion Islam) 50 MCG/ACT nasal spray Reported on 03/12/2015 10/07/14   Historical Provider, MD  ketorolac (ACULAR) 0.4 % SOLN 1 drop 2 (two) times daily.  03/07/15   Historical Provider, MD  pantoprazole (PROTONIX) 40 MG tablet Take 1 tablet (40 mg total) by mouth daily. 03/18/15   Wellington Hampshire, MD  rivastigmine (EXELON) 9.5 mg/24hr APPLY ONE PATCH TO SKIN AND CHANGE DAILY 03/05/15   Bobetta Lime, MD  rosuvastatin (CRESTOR) 20 MG tablet Take by mouth. 04/17/14   Historical Provider, MD  VIGAMOX 0.5 % ophthalmic solution Place 1 drop into the left eye 4 (four) times daily.  03/07/15   Historical Provider, MD  zolpidem (AMBIEN) 10 MG tablet Take 10 mg by mouth at bedtime as needed.  08/07/14   Historical Provider, MD      VITAL SIGNS:  Blood pressure 148/69, pulse 59, temperature 97.6 F (36.4 C), temperature source Oral, resp. rate 18, height '5\' 8"'$  (1.727 m), weight 70.761 kg (156 lb), SpO2 95 %.  PHYSICAL EXAMINATION:  GENERAL:  81 y.o.-year-old patient lying in the bed with no acute distress.  EYES: Pupils equal, round, reactive to light and accommodation. No scleral icterus. Extraocular muscles intact.  HEENT: Head atraumatic, normocephalic. Oropharynx and nasopharynx clear.  NECK:  Supple, no jugular venous distention. No thyroid enlargement, no tenderness.  LUNGS: Decreased breath sounds bilaterally bases, fine crackles at the bases, no wheezing, rhonchi or crepitation. No use of accessory muscles of respiration.  CARDIOVASCULAR: S1, S2 normal. No murmurs, rubs, or gallops.  ABDOMEN: Soft, nontender, nondistended. Bowel sounds  present. No organomegaly or mass.  EXTREMITIES: No pedal edema, cyanosis, or clubbing.  NEUROLOGIC: Cranial nerves II through XII are intact. Muscle strength 5/5 in all extremities. Sensation intact. Gait not checked.  PSYCHIATRIC: The patient is alert and oriented x 3.  SKIN: No rash, lesion, or ulcer.   LABORATORY PANEL:   CBC  Recent Labs Lab 03/18/15 1654  WBC 8.3  HGB 14.4  HCT 42.9  PLT 224   ------------------------------------------------------------------------------------------------------------------  Chemistries   Recent Labs Lab 03/18/15 1654  NA 141  K 4.5  CL 107  CO2 28  GLUCOSE 81  BUN 20  CREATININE 0.85  CALCIUM 8.9   ------------------------------------------------------------------------------------------------------------------  Cardiac Enzymes  Recent Labs Lab 03/18/15 1654  TROPONINI <0.03   ------------------------------------------------------------------------------------------------------------------  RADIOLOGY:  Ct Angio Chest Pe W/cm &/or Wo Cm  03/19/2015  CLINICAL DATA:  Shortness of breath.  Elevated D-dimer. EXAM: CT ANGIOGRAPHY CHEST WITH CONTRAST TECHNIQUE: Multidetector CT imaging of the chest was performed using the  standard protocol during bolus administration of intravenous contrast. Multiplanar CT image reconstructions and MIPs were obtained to evaluate the vascular anatomy. CONTRAST:  2m OMNIPAQUE IOHEXOL 350 MG/ML SOLN COMPARISON:  03/18/2015 FINDINGS: No filling defects in the pulmonary arteries to suggest pulmonary emboli. Heart is normal size. Coronary artery and aortic atherosclerosis. No aneurysm. There is mild mediastinal adenopathy. Some of the enlarged lymph nodes are calcified within the left AP window. Mild prominence of hilar lymph nodes bilaterally with speckled calcifications in the left hilar lymph nodes. Right paratracheal lymph node has a short axis diameter of 12 mm on image 48. No axillary adenopathy. Chest  wall soft tissues are unremarkable. Imaging into the upper abdomen shows no acute findings. Peripheral interstitial thickening with early honeycombing in the mid and lower lungs, most compatible with interstitial lung disease and fibrosis. No Save pleural effusions. No acute bony abnormality or focal bone lesion. Review of the MIP images confirms the above findings. IMPRESSION: Interstitial thickening peripherally with ground-glass opacities and honeycombing, most compatible with chronic lung disease/ fibrosis. Mild mediastinal and bilateral hilar adenopathy, some which contain speckled calcifications. This is likely reactive in door related to old granulomatous disease. No evidence of pulmonary embolus. Coronary artery disease.  Aortic atherosclerosis. Electronically Signed   By: KRolm BaptiseM.D.   On: 03/19/2015 11:38   UKoreaVenous Img Lower Bilateral  03/20/2015  CLINICAL DATA:  Bilateral lower extremity pain. Elevated D-dimer. Evaluate for DVT. EXAM: BILATERAL LOWER EXTREMITY VENOUS DOPPLER ULTRASOUND TECHNIQUE: Gray-scale sonography with graded compression, as well as color Doppler and duplex ultrasound were performed to evaluate the lower extremity deep venous systems from the level of the common femoral vein and including the common femoral, femoral, profunda femoral, popliteal and calf veins including the posterior tibial, peroneal and gastrocnemius veins when visible. The superficial great saphenous vein was also interrogated. Spectral Doppler was utilized to evaluate flow at rest and with distal augmentation maneuvers in the common femoral, femoral and popliteal veins. COMPARISON:  None. FINDINGS: RIGHT LOWER EXTREMITY Common Femoral Vein: No evidence of thrombus. Normal compressibility, respiratory phasicity and response to augmentation. Saphenofemoral Junction: No evidence of thrombus. Normal compressibility and flow on color Doppler imaging. Profunda Femoral Vein: No evidence of thrombus. Normal  compressibility and flow on color Doppler imaging. Femoral Vein: No evidence of thrombus. Normal compressibility, respiratory phasicity and response to augmentation. Popliteal Vein: No evidence of thrombus. Normal compressibility, respiratory phasicity and response to augmentation. Calf Veins: No evidence of thrombus. Normal compressibility and flow on color Doppler imaging. Superficial Great Saphenous Vein: No evidence of thrombus. Normal compressibility and flow on color Doppler imaging. Venous Reflux:  None. Other Findings: There is a minimal amount of eccentric mixed echogenic plaque within the right common femoral artery (image 3). LEFT LOWER EXTREMITY Common Femoral Vein: No evidence of thrombus. Normal compressibility, respiratory phasicity and response to augmentation. Saphenofemoral Junction: No evidence of thrombus. Normal compressibility and flow on color Doppler imaging. Profunda Femoral Vein: No evidence of thrombus. Normal compressibility and flow on color Doppler imaging. Femoral Vein: No evidence of thrombus. Normal compressibility, respiratory phasicity and response to augmentation. Popliteal Vein: No evidence of thrombus. Normal compressibility, respiratory phasicity and response to augmentation. Calf Veins: No evidence of thrombus. Normal compressibility and flow on color Doppler imaging. Superficial Great Saphenous Vein: No evidence of thrombus. Normal compressibility and flow on color Doppler imaging. Venous Reflux:  None. Other Findings:  None. IMPRESSION: No evidence of DVT within either lower extremity. Electronically Signed  By: Sandi Mariscal M.D.   On: 03/20/2015 11:49    IMPRESSION AND PLAN:   1. Acute respiratory failure with hypoxia with ambulation, interstitial lung disease with honeycombing. Case discussed with Dr. Devona Konig pulmonary. He would like a trial of steroids and antibiotics. I will start Solu-Medrol Rocephin and Zithromax. I will start nebulizer treatments. I will  check pulse ox in the a.m. with ambulation. I will send off an ANA, ANCA, ESR. Dr. [name] may end up doing a bronchoscopy. I will hold aspirin. 2. History of coronary artery disease with negative stress test in March. Continue Coreg. 3. Hyperlipidemia unspecified continue Crestor 4. Recent cataract surgery on eyedrops 5. Gastroesophageal reflux disease without esophagitis- continue PPI.  All the records are reviewed and case discussed with ED provider. Management plans discussed with the patient, family and they are in agreement.  CODE STATUS: Full code  TOTAL TIME TAKING CARE OF THIS PATIENT: 50  minutes.    Loletha Grayer M.D on 03/20/2015 at 4:14 PM  Between 7am to 6pm - Pager - [phone]  After 6pm call admission pager [facility]  Office  [phone]  CC: Primary care physician; Dr Nadine Counts

## 2015-03-20 NOTE — Consult Note (Signed)
Pulmonary Critical Care  Initial Consult Note   Vincent Kennedy OIN:[id number] DOB: 1932/12/23 DOA: 03/20/2015  Referring physician: Andria Rhein, MD PCP: Casilda Carls, MD   Chief Complaint: Shortness of Breath  HPI: Vincent Kennedy is a 81 y.o. male with a prior history of CAD presented with increased shortness of breath. He states that recently he had travelled to [place] and since he was there he noted increased SOB. He does not recall having any febrile illness. He went to see dr Fletcher Anon and had a workup for a PE. A D-dimner was elevated. CT scan of the chest was done and this was reviewed by me and this shows GGOs with ILD and some evidence of honeycombing. He has had a cough and has noted wheezing. He has not had any hemoptysis. He has no chest pain. He states that the cough was mainly dry. His wife notes that he has had blue lips at times. He also has been feeling cold even in room temperature. He was seen in the office and on ambulation he was noted to be desaturating to 85% on oxygen.   Review of Systems:  Constitutional:  +weight loss almost 20 pounds, +night sweats according to the wife, no Fevers, +body aches.  HEENT:  No headaches, nasal congestion, post nasal drip,  Cardio-vascular:  +chest pain, no swelling in lower extremities +h/o syncope  GI:  No heartburn, indigestion, abdominal pain, nausea, vomiting, diarrhea  Resp:  +shortness of breath. +cough.+wheezing Skin:  no rash or lesions.  Musculoskeletal:  No joint pain or swelling.   Remainder ROS performed and is unremarkable other than noted in HPI  Past Medical History  Diagnosis Date  . Coronary artery disease     Cardiac cath September 2008: 20% left main stenosis, 40% mid LAD stenosis, 99% ostial first diagonal stenosis in a large branch and 90% ostial disease in second diagonal but the vessel was very small, 20% mid RCA stenosis with normal ejection fraction. Successful angioplasty and drug-eluting stent  placement to the ostial first diagonal with a 2.5 x 15 mm Xience stent  . Hyperlipidemia   . Abnormal nuclear cardiac imaging test     Dr. Fletcher Anon  . Cataract   . Sleep apnea     has not used CPAP recently.  Feels condition has improved.  . Hypertension     BP has never been high(per pt).  Metoprolol is to keep BP low because of stent (afterload)  . Shortness of breath dyspnea     with exertion  . Wears hearing aid     (sometimes)   Past Surgical History  Procedure Laterality Date  . Cardiac catheterization      2010  . Coronary angioplasty  2010  . Hernia repair Bilateral     inguinal  . Colonoscopy with esophagogastroduodenoscopy (egd)    . Cataract extraction w/phaco Right 01/29/2015    Procedure: CATARACT EXTRACTION PHACO AND INTRAOCULAR LENS PLACEMENT (IOC);  Surgeon: Leandrew Koyanagi, MD;  Location: [place];  Service: Ophthalmology;  Laterality: Right;  RESTOR LENS CPAP  . Cataract extraction w/phaco Left 03/12/2015    Procedure: CATARACT EXTRACTION PHACO AND INTRAOCULAR LENS PLACEMENT (IOC);  Surgeon: Leandrew Koyanagi, MD;  Location: [place];  Service: Ophthalmology;  Laterality: Left;  RESTOR SHUGARCAINE   Social History:  reports that he has never smoked. He does not have any smokeless tobacco history on file. He reports that he does not drink alcohol or use illicit drugs.  No Known  Allergies  Family History  Problem Relation Age of Onset  . CAD Father   . CAD Brother     Prior to Admission medications   Medication Sig Start Date End Date Taking? Authorizing Provider  aspirin 325 MG tablet Take 325 mg by mouth daily.    Historical Provider, MD  carvedilol (COREG) 12.5 MG tablet Take 1 tablet (12.5 mg total) by mouth 2 (two) times daily. 03/15/15   Wellington Hampshire, MD  fluticasone Asencion Islam) 50 MCG/ACT nasal spray Reported on 03/12/2015 10/07/14   Historical Provider, MD  ketorolac (ACULAR) 0.4 % SOLN 1 drop 2 (two) times daily.  03/07/15    Historical Provider, MD  pantoprazole (PROTONIX) 40 MG tablet Take 1 tablet (40 mg total) by mouth daily. 03/18/15   Wellington Hampshire, MD  rivastigmine (EXELON) 9.5 mg/24hr APPLY ONE PATCH TO SKIN AND CHANGE DAILY 03/05/15   Bobetta Lime, MD  rosuvastatin (CRESTOR) 20 MG tablet Take by mouth. 04/17/14   Historical Provider, MD  VIGAMOX 0.5 % ophthalmic solution Place 1 drop into the left eye 4 (four) times daily.  03/07/15   Historical Provider, MD  zolpidem (AMBIEN) 10 MG tablet Take 10 mg by mouth at bedtime as needed.  08/07/14   Historical Provider, MD   Physical Exam: Filed Vitals:   03/20/15 1532 03/20/15 1535 03/20/15 1609  BP: 148/69    Pulse: 59    Temp: 97.6 F (36.4 C)    TempSrc: Oral    Resp: 18    Height:  5\' 8"  (1.727 m)   Weight:  70.761 kg (156 lb)   SpO2: 95%  97%    Wt Readings from Last 3 Encounters:  03/20/15 70.761 kg (156 lb)  03/18/15 71.442 kg (157 lb 8 oz)  03/12/15 72.576 kg (160 lb)    General:  Appears calm and comfortable Eyes: PERRL, normal lids, irises & conjunctiva ENT: grossly normal hearing, lips & tongue Neck: no LAD, masses or thyromegaly Cardiovascular: RRR, no m/r/g. No LE edema. Respiratory: Normal respiratory effort. +crackles bilateral Abdomen: soft, nontender Skin: no rash or induration seen on limited exam Musculoskeletal: grossly normal tone BUE/BLE Psychiatric: grossly normal mood and affect Neurologic: grossly non-focal.          Labs on Admission:  Basic Metabolic Panel:  Recent Labs Lab 03/18/15 1654 03/20/15 1655  NA 141 138  K 4.5 3.8  CL 107 106  CO2 28 28  GLUCOSE 81 98  BUN 20 16  CREATININE 0.85 0.81  CALCIUM 8.9 9.0   Liver Function Tests:  Recent Labs Lab 03/20/15 1655  AST 23  ALT 14*  ALKPHOS 62  BILITOT 0.9  PROT 7.7  ALBUMIN 3.5   No results for input(s): LIPASE, AMYLASE in the last 168 hours. No results for input(s): AMMONIA in the last 168 hours. CBC:  Recent Labs Lab  03/18/15 1654 03/20/15 1655  WBC 8.3 7.4  NEUTROABS 4.9  --   HGB 14.4 13.5  HCT 42.9 40.2  MCV 88.6 86.5  PLT 224 225   Cardiac Enzymes:  Recent Labs Lab 03/18/15 1654  TROPONINI <0.03    BNP (last 3 results)  Recent Labs  03/18/15 1654  BNP 49.0    ProBNP (last 3 results) No results for input(s): PROBNP in the last 8760 hours.  CBG: No results for input(s): GLUCAP in the last 168 hours.  Radiological Exams on Admission: Ct Angio Chest Pe W/cm &/or Wo Cm  03/19/2015  CLINICAL DATA:  Shortness  of breath.  Elevated D-dimer. EXAM: CT ANGIOGRAPHY CHEST WITH CONTRAST TECHNIQUE: Multidetector CT imaging of the chest was performed using the standard protocol during bolus administration of intravenous contrast. Multiplanar CT image reconstructions and MIPs were obtained to evaluate the vascular anatomy. CONTRAST:  80mL OMNIPAQUE IOHEXOL 350 MG/ML SOLN COMPARISON:  03/18/2015 FINDINGS: No filling defects in the pulmonary arteries to suggest pulmonary emboli. Heart is normal size. Coronary artery and aortic atherosclerosis. No aneurysm. There is mild mediastinal adenopathy. Some of the enlarged lymph nodes are calcified within the left AP window. Mild prominence of hilar lymph nodes bilaterally with speckled calcifications in the left hilar lymph nodes. Right paratracheal lymph node has a short axis diameter of 12 mm on image 48. No axillary adenopathy. Chest wall soft tissues are unremarkable. Imaging into the upper abdomen shows no acute findings. Peripheral interstitial thickening with early honeycombing in the mid and lower lungs, most compatible with interstitial lung disease and fibrosis. No Save pleural effusions. No acute bony abnormality or focal bone lesion. Review of the MIP images confirms the above findings. IMPRESSION: Interstitial thickening peripherally with ground-glass opacities and honeycombing, most compatible with chronic lung disease/ fibrosis. Mild mediastinal and  bilateral hilar adenopathy, some which contain speckled calcifications. This is likely reactive in door related to old granulomatous disease. No evidence of pulmonary embolus. Coronary artery disease.  Aortic atherosclerosis. Electronically Signed   By: Charlett NoseKevin  Dover M.D.   On: 03/19/2015 11:38   [place] Venous Img Lower Bilateral  03/20/2015  CLINICAL DATA:  Bilateral lower extremity pain. Elevated D-dimer. Evaluate for DVT. EXAM: BILATERAL LOWER EXTREMITY VENOUS DOPPLER ULTRASOUND TECHNIQUE: Gray-scale sonography with graded compression, as well as color Doppler and duplex ultrasound were performed to evaluate the lower extremity deep venous systems from the level of the common femoral vein and including the common femoral, femoral, profunda femoral, popliteal and calf veins including the posterior tibial, peroneal and gastrocnemius veins when visible. The superficial great saphenous vein was also interrogated. Spectral Doppler was utilized to evaluate flow at rest and with distal augmentation maneuvers in the common femoral, femoral and popliteal veins. COMPARISON:  None. FINDINGS: RIGHT LOWER EXTREMITY Common Femoral Vein: No evidence of thrombus. Normal compressibility, respiratory phasicity and response to augmentation. Saphenofemoral Junction: No evidence of thrombus. Normal compressibility and flow on color Doppler imaging. Profunda Femoral Vein: No evidence of thrombus. Normal compressibility and flow on color Doppler imaging. Femoral Vein: No evidence of thrombus. Normal compressibility, respiratory phasicity and response to augmentation. Popliteal Vein: No evidence of thrombus. Normal compressibility, respiratory phasicity and response to augmentation. Calf Veins: No evidence of thrombus. Normal compressibility and flow on color Doppler imaging. Superficial Great Saphenous Vein: No evidence of thrombus. Normal compressibility and flow on color Doppler imaging. Venous Reflux:  None. Other Findings: There is  a minimal amount of eccentric mixed echogenic plaque within the right common femoral artery (image 3). LEFT LOWER EXTREMITY Common Femoral Vein: No evidence of thrombus. Normal compressibility, respiratory phasicity and response to augmentation. Saphenofemoral Junction: No evidence of thrombus. Normal compressibility and flow on color Doppler imaging. Profunda Femoral Vein: No evidence of thrombus. Normal compressibility and flow on color Doppler imaging. Femoral Vein: No evidence of thrombus. Normal compressibility, respiratory phasicity and response to augmentation. Popliteal Vein: No evidence of thrombus. Normal compressibility, respiratory phasicity and response to augmentation. Calf Veins: No evidence of thrombus. Normal compressibility and flow on color Doppler imaging. Superficial Great Saphenous Vein: No evidence of thrombus. Normal compressibility and flow on color Doppler  imaging. Venous Reflux:  None. Other Findings:  None. IMPRESSION: No evidence of DVT within either lower extremity. Electronically Signed   By: Sandi Mariscal M.D.   On: 03/20/2015 11:49         EKG: Independently reviewed.  Assessment/Plan Active Problems:   Acute respiratory failure with hypoxia (HCC)   1. Acute hypoxic respiratory failure -very impressive Ct scan findings noted with interstitial lung disease and significant hypoxia notedi n the office with sats down to 85% on oxygen -will place on oxygen as needed -check ABG now on RA  2. Interstitial Lung disease -needs further workup for ild possible infectious vs inflammatory disease vs less likely malignancy -will continue with rocephin and azithromycin -will get ANA ANCA Sed Rate Anti-GBM and ACE level -I suspect he may have PAH also with his prior history of syncope need to assess RV pressures -would also get Quanteferon gold assay with recent trip to [place] concern for MTB -started on IV steroids and antibiotics with Dr Earleen Newport -will consider  bronchoscopy and biopsy if there is no improvement with conservative therapy  Code Status: Full Code  Family Communication: wife and son Disposition Plan: home      I have personally obtained a history, examined the patient, evaluated laboratory and imaging results, formulated the assessment and plan and placed orders.  The Patient requires high complexity decision making for assessment and support.    Allyne Gee, MD [facility] Pulmonary Critical Care Medicine Sleep Medicine

## 2015-03-21 ENCOUNTER — Inpatient Hospital Stay (HOSPITAL_COMMUNITY)
Admission: AD | Admit: 2015-03-21 | Discharge: 2015-03-21 | Disposition: A | Discharge status: Home / Self Care | Payer: Medicare Other | Source: Ambulatory Visit | Attending: Internal Medicine | Admitting: Internal Medicine

## 2015-03-21 DIAGNOSIS — R55 Syncope and collapse: Secondary | ICD-10-CM

## 2015-03-21 MED ORDER — METHYLPREDNISOLONE SODIUM SUCC 40 MG IJ SOLR
40.0000 mg | Freq: Four times a day (QID) | INTRAMUSCULAR | Status: DC
  Administered 2015-03-21 – 2015-03-22 (×4): 40 mg via INTRAVENOUS
  Filled 2015-03-21 (×4): qty 1

## 2015-03-21 NOTE — Progress Notes (Signed)
Initial Nutrition Assessment   INTERVENTION:   Meals and Snacks: Cater to patient preferences  NUTRITION DIAGNOSIS:   No nutrition diagnosis at this time  GOAL:   Patient will meet greater than or equal to 90% of their needs  MONITOR:    (Energy Intake, Anthropometrics, Digestive System, Pulmonary)  REASON FOR ASSESSMENT:   Malnutrition Screening Tool    ASSESSMENT:    Pt admitted with acute respiratory failure with interstitial lung disease  Past Medical History  Diagnosis Date  . Coronary artery disease     Cardiac cath September 2008: 20% left main stenosis, 40% mid LAD stenosis, 99% ostial first diagonal stenosis in a large branch and 90% ostial disease in second diagonal but the vessel was very small, 20% mid RCA stenosis with normal ejection fraction. Successful angioplasty and drug-eluting stent placement to the ostial first diagonal with a 2.5 x 15 mm Xience stent  . Hyperlipidemia   . Abnormal nuclear cardiac imaging test     Dr. Kirke CorinArida  . Cataract   . Sleep apnea     has not used CPAP recently.  Feels condition has improved.  . Hypertension     BP has never been high(per pt).  Metoprolol is to keep BP low because of stent (afterload)  . Shortness of breath dyspnea     with exertion  . Wears hearing aid     (sometimes)    Diet Order:  Diet 2 gram sodium Room service appropriate?: Yes; Fluid consistency:: Thin   Energy Intake: pt eating 100% of meals, appetite good  Electrolyte and Renal Profile:  Recent Labs Lab 03/18/15 1654 03/20/15 1655  BUN 20 16  CREATININE 0.85 0.81  NA 141 138  K 4.5 3.8   Glucose Profile: No results for input(s): GLUCAP in the last 72 hours. Protein Profile:   Recent Labs Lab 03/20/15 1655  ALBUMIN 3.5   Meds: solumedrol  Height:   Ht Readings from Last 1 Encounters:  03/20/15 5\' 8"  (1.727 m)    Weight: weight as per weight encounters, relatively stable over past year  Wt Readings from Last 1 Encounters:   03/20/15 156 lb (70.761 kg)    Wt Readings from Last 10 Encounters:  03/20/15 156 lb (70.761 kg)  03/18/15 157 lb 8 oz (71.442 kg)  03/12/15 160 lb (72.576 kg)  01/29/15 156 lb (70.761 kg)  10/16/14 155 lb 12.8 oz (70.67 kg)  04/17/14 160 lb (72.576 kg)  09/13/14 165 lb (74.844 kg)  06/10/14 158 lb 4 oz (71.782 kg)  07/23/13 158 lb 8 oz (71.895 kg)  12/11/12 166 lb (75.297 kg)    BMI:  Body mass index is 23.73 kg/(m^2).  LOW Care Level  Romelle StarcherCate Callyn Severtson MS, [place]RD, LDN [phone] Pager  [phone] Weekend/On-Call Pager

## 2015-03-21 NOTE — Care Management Note (Signed)
Case Management Note  Patient Details  Name: Vincent Kennedy MRN: [id number] Date of Birth: Nov 20, 1932  Subjective/Objective:                  Spoke with RN caring for patient and she states that patient's O2 sats drop when out of bed and that she will re-assess the need for home O2. I spoke Dr. Clydie BraunBhatti by phone. He denies need for home O2. He denies need for ambulatory equipment. His PCP is with [facility]. He agrees to home O2 if needed and has no preference for agencies. He lives with his wife and he states she is able to assist him if needed. Typically he is independent with mobility. He denies difficulty obtaining Rx.  Action/Plan: Referral called to Will with [facility] to follow for home O2 needs. RN will assess for O2 need. RNCM to continue to follow.   Expected Discharge Date:                  Expected Discharge Plan:     In-House Referral:     Discharge planning Services  CM Consult  Post Acute Care Choice:    Choice offered to:  Patient  DME Arranged:  Oxygen DME Agency:  [facility]  HH Arranged:    HH Agency:     Status of Service:  In process, will continue to follow  Medicare Important Message Given:    Date Medicare IM Given:    Medicare IM give by:    Date Additional Medicare IM Given:    Additional Medicare Important Message give by:     If discussed at Long Length of Stay Meetings, dates discussed:    Additional Comments:  Collie Siadngela Naly Schwanz, RN 03/21/2015, 1:49 PM

## 2015-03-21 NOTE — Progress Notes (Signed)
SATURATION QUALIFICATIONS: (This note is used to comply with regulatory documentation for home oxygen)  Patient Saturations on Room Air at Rest = 93%  Patient Saturations on Room Air while Ambulating = 87%  Patient Saturations on 2 Liters of oxygen while Ambulating = 89-90%  Principal FinancialBrandi R Mansfield

## 2015-03-21 NOTE — Progress Notes (Signed)
Patient ID: Vincent Kennedy, male   DOB: 1932-06-16, 81 y.o.   MRN: [id number] [facility] PROGRESS NOTE HPI/Subjective: Patient feels about the same. His major complaint is shortness of breath with exertion. No shortness of breath at rest. Still with a cough about the same as yesterday.  Objective: Filed Vitals:   03/21/15 0900 03/21/15 1147  BP: 138/61 115/57  Pulse: 77 59  Temp:  97.7 F (36.5 C)  Resp:  18    Filed Weights   03/20/15 1535  Weight: 70.761 kg (156 lb)    ROS: Review of Systems  Constitutional: Negative for fever and chills.  Eyes: Negative for blurred vision.  Respiratory: Positive for cough and shortness of breath. Negative for hemoptysis and sputum production.   Cardiovascular: Negative for chest pain.  Gastrointestinal: Negative for nausea, vomiting, abdominal pain, diarrhea and constipation.  Genitourinary: Negative for dysuria.  Musculoskeletal: Negative for joint pain.  Neurological: Negative for dizziness and headaches.   Exam: Physical Exam  Constitutional: He is oriented to person, place, and time.  HENT:  Nose: No mucosal edema.  Mouth/Throat: No oropharyngeal exudate or posterior oropharyngeal edema.  Eyes: Conjunctivae, EOM and lids are normal. Pupils are equal, round, and reactive to light.  Neck: No JVD present. Carotid bruit is not present. No edema present. No thyroid mass and no thyromegaly present.  Cardiovascular: S1 normal and S2 normal.  Exam reveals no gallop.   No murmur heard. Pulses:      Dorsalis pedis pulses are 2+ on the right side, and 2+ on the left side.  Respiratory: No respiratory distress. He has wheezes in the right middle field and the left middle field. He has no rhonchi. He has rales in the right lower field and the left lower field.  GI: Soft. Bowel sounds are normal. There is no tenderness.  Musculoskeletal:       Right ankle: He exhibits no swelling.       Left ankle: He exhibits no swelling.   Lymphadenopathy:    He has no cervical adenopathy.  Neurological: He is alert and oriented to person, place, and time. No cranial nerve deficit.  Skin: Skin is warm. No rash noted. Nails show clubbing.  Psychiatric: He has a normal mood and affect.    Data Reviewed: Basic Metabolic Panel:  Recent Labs Lab 03/18/15 1654 03/20/15 1655  NA 141 138  K 4.5 3.8  CL 107 106  CO2 28 28  GLUCOSE 81 98  BUN 20 16  CREATININE 0.85 0.81  CALCIUM 8.9 9.0   Liver Function Tests:  Recent Labs Lab 03/20/15 1655  AST 23  ALT 14*  ALKPHOS 62  BILITOT 0.9  PROT 7.7  ALBUMIN 3.5   CBC:  Recent Labs Lab 03/18/15 1654 03/20/15 1655  WBC 8.3 7.4  NEUTROABS 4.9  --   HGB 14.4 13.5  HCT 42.9 40.2  MCV 88.6 86.5  PLT 224 225   Cardiac Enzymes:  Recent Labs Lab 03/18/15 1654  TROPONINI <0.03   BNP (last 3 results)  Recent Labs  03/18/15 1654  BNP 49.0     Studies: US Venous Img Lower Bilateral  03/20/2015  CLINICAL DATA:  Bilateral lower extremity pain. Elevated D-dimer. Evaluate for DVT. EXAM: BILATERAL LOWER EXTREMITY VENOUS DOPPLER ULTRASOUND TECHNIQUE: Gray-scale sonography with graded compression, as well as color Doppler and duplex ultrasound were performed to evaluate the lower extremity deep venous systems from the level of the common femoral vein and including the common  femoral, femoral, profunda femoral, popliteal and calf veins including the posterior tibial, peroneal and gastrocnemius veins when visible. The superficial great saphenous vein was also interrogated. Spectral Doppler was utilized to evaluate flow at rest and with distal augmentation maneuvers in the common femoral, femoral and popliteal veins. COMPARISON:  None. FINDINGS: RIGHT LOWER EXTREMITY Common Femoral Vein: No evidence of thrombus. Normal compressibility, respiratory phasicity and response to augmentation. Saphenofemoral Junction: No evidence of thrombus. Normal compressibility and flow on  color Doppler imaging. Profunda Femoral Vein: No evidence of thrombus. Normal compressibility and flow on color Doppler imaging. Femoral Vein: No evidence of thrombus. Normal compressibility, respiratory phasicity and response to augmentation. Popliteal Vein: No evidence of thrombus. Normal compressibility, respiratory phasicity and response to augmentation. Calf Veins: No evidence of thrombus. Normal compressibility and flow on color Doppler imaging. Superficial Great Saphenous Vein: No evidence of thrombus. Normal compressibility and flow on color Doppler imaging. Venous Reflux:  None. Other Findings: There is a minimal amount of eccentric mixed echogenic plaque within the right common femoral artery (image 3). LEFT LOWER EXTREMITY Common Femoral Vein: No evidence of thrombus. Normal compressibility, respiratory phasicity and response to augmentation. Saphenofemoral Junction: No evidence of thrombus. Normal compressibility and flow on color Doppler imaging. Profunda Femoral Vein: No evidence of thrombus. Normal compressibility and flow on color Doppler imaging. Femoral Vein: No evidence of thrombus. Normal compressibility, respiratory phasicity and response to augmentation. Popliteal Vein: No evidence of thrombus. Normal compressibility, respiratory phasicity and response to augmentation. Calf Veins: No evidence of thrombus. Normal compressibility and flow on color Doppler imaging. Superficial Great Saphenous Vein: No evidence of thrombus. Normal compressibility and flow on color Doppler imaging. Venous Reflux:  None. Other Findings:  None. IMPRESSION: No evidence of DVT within either lower extremity. Electronically Signed   By: Simonne Come M.D.   On: 03/20/2015 11:49    Scheduled Meds: . azithromycin  250 mg Oral Daily  . budesonide (PULMICORT) nebulizer solution  0.25 mg Nebulization BID  . carvedilol  12.5 mg Oral BID  . cefTRIAXone (ROCEPHIN)  IV  1 g Intravenous Q24H  . enoxaparin (LOVENOX) injection   40 mg Subcutaneous Q24H  . fluticasone  2 spray Each Nare Daily  . ipratropium-albuterol  3 mL Nebulization Q6H  . ketorolac  1 drop Both Eyes BID  . methylPREDNISolone (SOLU-MEDROL) injection  40 mg Intravenous Q6H  . moxifloxacin  1 drop Left Eye QID  . pantoprazole  40 mg Oral Daily  . rivastigmine  9.5 mg Transdermal Daily  . rosuvastatin  20 mg Oral q1800  . sodium chloride  3 mL Intravenous Q12H    Assessment/Plan:  1. Acute respiratory failure with hypoxia. Likely this is going to be chronic respiratory failure. I personally walked the patient around and his pulse ox on room air was 87 percent on room air after ambulation. I spoke with the care manager to set him up for home oxygen. 2. Interstitial lung disease. Rheumatological workup sent off. I spoke with [name] pulmonary and he would at least like to watch him through today into the hospital and reevaluate tomorrow. He will likely set up for bronchoscopy next week. An open lung biopsy may have to be done in the future also. A trial of steroids and antibiotics given to see if any improvement. I will decrease the dose of the Solu-Medrol to 40 mg every 6 hours. 3. History of coronary artery disease with a negative stress test in March. Continue Coreg. Hold aspirin for  upcoming bronchoscopy 4. Hyperlipidemia unspecified continue Crestor 5. Recent cataract surgery on eye drops 6. Gastroesophageal reflux disease without esophagitis continue PPI.  Code Status:     Code Status Orders        Start     Ordered   03/20/15 1604  Full code   Continuous     03/20/15 1604     Family Communication: Family at bedside Disposition Plan: Home potentially over the weekend  Consultants:  Pulmonary  Antibiotics:  Rocephin  Zithromax  Time spent: 30 minutes  Alford HighlandWIETING, Parrish Daddario  [facility]

## 2015-03-21 NOTE — Progress Notes (Signed)
*  PRELIMINARY RESULTS* °Echocardiogram °2D Echocardiogram has been performed. ° °Vincent Kennedy °03/21/2015, 5:28 PM °

## 2015-03-22 MED ORDER — DEXTROSE 5 % IV SOLN
1.0000 g | Freq: Once | INTRAVENOUS | Status: AC
  Administered 2015-03-22: 1 g via INTRAVENOUS
  Filled 2015-03-22: qty 10

## 2015-03-22 MED ORDER — IPRATROPIUM-ALBUTEROL 0.5-2.5 (3) MG/3ML IN SOLN: 3.0000 mL | Freq: Four times a day (QID) | RESPIRATORY_TRACT | Status: DC

## 2015-03-22 MED ORDER — MOMETASONE FURO-FORMOTEROL FUM 100-5 MCG/ACT IN AERO
2.0000 | INHALATION_SPRAY | Freq: Two times a day (BID) | RESPIRATORY_TRACT | Status: DC
  Administered 2015-03-22: 2 via RESPIRATORY_TRACT
  Filled 2015-03-22: qty 8.8

## 2015-03-22 MED ORDER — CARVEDILOL 6.25 MG PO TABS: 6.2500 mg | ORAL_TABLET | Freq: Two times a day (BID) | ORAL | Status: DC

## 2015-03-22 MED ORDER — ALBUTEROL SULFATE HFA 108 (90 BASE) MCG/ACT IN AERS: 2.0000 | INHALATION_SPRAY | Freq: Four times a day (QID) | RESPIRATORY_TRACT | Status: DC | PRN

## 2015-03-22 MED ORDER — PREDNISONE 20 MG PO TABS: ORAL_TABLET | ORAL | Status: DC

## 2015-03-22 MED ORDER — CEFUROXIME AXETIL 250 MG PO TABS: 500.0000 mg | ORAL_TABLET | Freq: Two times a day (BID) | ORAL | Status: DC

## 2015-03-22 MED ORDER — PREDNISONE 1 MG PO TABS: 60.0000 mg | ORAL_TABLET | Freq: Every day | ORAL | Status: DC

## 2015-03-22 MED ORDER — ALBUTEROL SULFATE (2.5 MG/3ML) 0.083% IN NEBU: 3.0000 mL | INHALATION_SOLUTION | Freq: Four times a day (QID) | RESPIRATORY_TRACT | Status: DC | PRN

## 2015-03-22 MED ORDER — AZITHROMYCIN 250 MG PO TABS: ORAL_TABLET | ORAL | Status: DC

## 2015-03-22 MED ORDER — MOMETASONE FURO-FORMOTEROL FUM 100-5 MCG/ACT IN AERO: 2.0000 | INHALATION_SPRAY | Freq: Two times a day (BID) | RESPIRATORY_TRACT | Status: DC

## 2015-03-22 MED ORDER — CEFUROXIME AXETIL 500 MG PO TABS: 500.0000 mg | ORAL_TABLET | Freq: Two times a day (BID) | ORAL | Status: DC

## 2015-03-22 NOTE — Care Management Note (Signed)
Case Management Note  Patient Details  Name: Vincent Kennedy MRN: [id number] Date of Birth: 02-21-1933  Subjective/Objective:     New oxygen was ordered on 03/21/15 but not delivered. This Clinical research associatewriter called [facility] and faxed new oxygen orders to them. Requested that his portable oxygen tank be delivered as soon as possible because Dr Clydie BraunBhatti is ready for discharge.                Action/Plan:   Expected Discharge Date:                  Expected Discharge Plan:     In-House Referral:     Discharge planning Services  CM Consult  Post Acute Care Choice:    Choice offered to:  Patient  DME Arranged:  Oxygen DME Agency:  [facility]  HH Arranged:    HH Agency:     Status of Service:  In process, will continue to follow  Medicare Important Message Given:    Date Medicare IM Given:    Medicare IM give by:    Date Additional Medicare IM Given:    Additional Medicare Important Message give by:     If discussed at Long Length of Stay Meetings, dates discussed:    Additional Comments:  [name] A, RN 03/22/2015, 1:38 PM

## 2015-03-22 NOTE — Care Management Note (Signed)
Case Management Note  Patient Details  Name: Vincent Kennedy MRN: [id number] Date of Birth: 08/08/1932  Subjective/Objective:      Dr Clydie BraunBhatti requests that both his portable oxygen tank and his home oxygen be delivered to his home address. This Clinical research associatewriter updated Chasity at [facility] who will request delivery of portable and continuous oxygen to the home address.            Action/Plan:   Expected Discharge Date:                  Expected Discharge Plan:     In-House Referral:     Discharge planning Services  CM Consult  Post Acute Care Choice:    Choice offered to:  Patient  DME Arranged:  Oxygen DME Agency:  [facility]  HH Arranged:    HH Agency:     Status of Service:  In process, will continue to follow  Medicare Important Message Given:    Date Medicare IM Given:    Medicare IM give by:    Date Additional Medicare IM Given:    Additional Medicare Important Message give by:     If discussed at Long Length of Stay Meetings, dates discussed:    Additional Comments:  Tagan Bartram A, RN 03/22/2015, 3:48 PM

## 2015-03-22 NOTE — Discharge Summary (Signed)
[facility] - [place] at [facility]   PATIENT NAME: Vincent Kennedy    MR#:  [id number]  DATE OF BIRTH:  04/13/1932  DATE OF ADMISSION:  03/20/2015 ADMITTING PHYSICIAN: Alford Highland, MD  DATE OF DISCHARGE: 03/22/2015  PRIMARY CARE PHYSICIAN: [facility], Dr. Sherley Bounds   ADMISSION DIAGNOSIS:  Hypoxia Shortness of Breath  DISCHARGE DIAGNOSIS:  Active Problems:   Acute respiratory failure with hypoxia (HCC)   SECONDARY DIAGNOSIS:   Past Medical History  Diagnosis Date  . Coronary artery disease     Cardiac cath September 2008: 20% left main stenosis, 40% mid LAD stenosis, 99% ostial first diagonal stenosis in a large branch and 90% ostial disease in second diagonal but the vessel was very small, 20% mid RCA stenosis with normal ejection fraction. Successful angioplasty and drug-eluting stent placement to the ostial first diagonal with a 2.5 x 15 mm Xience stent  . Hyperlipidemia   . Abnormal nuclear cardiac imaging test     Dr. [name]  . Cataract   . Sleep apnea     has not used CPAP recently.  Feels condition has improved.  . Hypertension     BP has never been high(per pt).  Metoprolol is to keep BP low because of stent (afterload)  . Shortness of breath dyspnea     with exertion  . Wears hearing aid     (sometimes)    HOSPITAL COURSE:   1. Acute respiratory failure which is now chronic respiratory failure. With ambulation the patient gets short of breath and hypoxic down to a pulse ox of 87%. I recommended oxygen every time he is walking around or doing any activity. Oxygen was set up as outpatient. Patient was set up with nebulizers and Dulera at home. 2. Interstitial lung disease with pulmonary fibrosis. Rheumatological workup sent off. [name] pulmonary will follow-up as outpatient. He likely will set up for bronchoscopy and repeat CT scan. Continue prednisone 60 mg daily. Finish antibiotic course with Ceftin and Zithromax. We  will consider open lung biopsy as outpatient if bronchoscopy unrevealing. 3. History of coronary artery disease with a negative stress test in March. Continue Coreg. Hold aspirin for upcoming bronchoscopy 4. Hyperlipidemia unspecified continue Crestor. 5. Recent cataract surgery on eye drops 6. Gastroesophageal reflux disease without esophagitis on PPI   DISCHARGE CONDITIONS:   Satisfactory  CONSULTS OBTAINED:  Treatment Team:  Yevonne Pax, MD  DRUG ALLERGIES:  No Known Allergies  DISCHARGE MEDICATIONS:   Current Discharge Medication List    START taking these medications   Details  albuterol (PROVENTIL HFA;VENTOLIN HFA) 108 (90 Base) MCG/ACT inhaler Inhale 2 puffs into the lungs every 6 (six) hours as needed for wheezing or shortness of breath. Qty: 1 Inhaler, Refills: 2    azithromycin (ZITHROMAX) 250 MG tablet One tab daily for three days Qty: 3 each, Refills: 0    cefUROXime (CEFTIN) 500 MG tablet Take 1 tablet (500 mg total) by mouth 2 (two) times daily with a meal. Qty: 14 tablet, Refills: 0    ipratropium-albuterol (DUONEB) 0.5-2.5 (3) MG/3ML SOLN Take 3 mLs by nebulization every 6 (six) hours. Qty: 360 mL, Refills: 2    mometasone-formoterol (DULERA) 100-5 MCG/ACT AERO Inhale 2 puffs into the lungs 2 (two) times daily. Qty: 1 Inhaler, Refills: 2    predniSONE (DELTASONE) 20 MG tablet Three tablets daily until tapered off by Dr Ellen Henri: 90 tablet, Refills: 0      CONTINUE these medications which have  CHANGED   Details  carvedilol (COREG) 6.25 MG tablet Take 1 tablet (6.25 mg total) by mouth 2 (two) times daily. Qty: 60 tablet, Refills: 0      CONTINUE these medications which have NOT CHANGED   Details  fluticasone (FLONASE) 50 MCG/ACT nasal spray Reported on 03/12/2015    ketorolac (ACULAR) 0.4 % SOLN 1 drop 2 (two) times daily.     pantoprazole (PROTONIX) 40 MG tablet Take 1 tablet (40 mg total) by mouth daily. Qty: 30 tablet, Refills: 11     rivastigmine (EXELON) 9.5 mg/24hr APPLY ONE PATCH TO SKIN AND CHANGE DAILY Qty: 60 patch, Refills: 2    rosuvastatin (CRESTOR) 20 MG tablet Take by mouth.    VIGAMOX 0.5 % ophthalmic solution Place 1 drop into the left eye 4 (four) times daily.     zolpidem (AMBIEN) 10 MG tablet Take 10 mg by mouth at bedtime as needed.       STOP taking these medications     aspirin 325 MG tablet          DISCHARGE INSTRUCTIONS:   Follow-up with Dr. Welton FlakesKhan next week  If you experience worsening of your admission symptoms, develop shortness of breath, life threatening emergency, suicidal or homicidal thoughts you must seek medical attention immediately by calling 911 or calling your MD immediately  if symptoms less severe.  You Must read complete instructions/literature along with all the possible adverse reactions/side effects for all the Medicines you take and that have been prescribed to you. Take any new Medicines after you have completely understood and accept all the possible adverse reactions/side effects.   Please note  You were cared for by a hospitalist during your hospital stay. If you have any questions about your discharge medications or the care you received while you were in the hospital after you are discharged, you can call the unit and asked to speak with the hospitalist on call if the hospitalist that took care of you is not available. Once you are discharged, your primary care physician will handle any further medical issues. Please note that NO REFILLS for any discharge medications will be authorized once you are discharged, as it is imperative that you return to your primary care physician (or establish a relationship with a primary care physician if you do not have one) for your aftercare needs so that they can reassess your need for medications and monitor your lab values.    Today   CHIEF COMPLAINT:  Sent in for hypoxia as a direct admission  HISTORY OF PRESENT ILLNESS:   Vincent Kennedy  is a 81 y.o. male been having shortness of breath with activity and found to be hypoxic with walking around.   VITAL SIGNS:  Blood pressure 118/63, pulse 61, temperature 98.1 F (36.7 C), temperature source Oral, resp. rate 18, height 5\' 8"  (1.727 m), weight 70.761 kg (156 lb), SpO2 97 %.    PHYSICAL EXAMINATION:  GENERAL:  81 y.o.-year-old patient lying in the bed with no acute distress.   LUNGS: Better breath sounds bilaterally today, no wheezing, fine rales at bases, no rhonchi or crepitation. No use of accessory muscles of respiration.  CARDIOVASCULAR: S1, S2 normal. No murmurs, rubs, or gallops.  ABDOMEN: Soft, non-tender, non-distended.   EXTREMITIES: No pedal edema  PSYCHIATRIC: The patient is alert and oriented x 3.  SKIN: No obvious rash, lesion, or ulcer.   DATA REVIEW:   CBC  Recent Labs Lab 03/20/15 1655  WBC 7.4  HGB  13.5  HCT 40.2  PLT 225    Chemistries   Recent Labs Lab 03/20/15 1655  NA 138  K 3.8  CL 106  CO2 28  GLUCOSE 98  BUN 16  CREATININE 0.81  CALCIUM 9.0  AST 23  ALT 14*  ALKPHOS 62  BILITOT 0.9    Cardiac Enzymes  Recent Labs Lab 03/18/15 1654  TROPONINI <0.03   Management plans discussed with the patient, family and Dr. Freda Munro pulmonary and they are in agreement.  CODE STATUS:     Code Status Orders        Start     Ordered   03/20/15 1604  Full code   Continuous     03/20/15 1604      TOTAL TIME TAKING CARE OF THIS PATIENT: 35 minutes.    Alford Highland M.D on 03/22/2015 at 11:56 AM  Between 7am to 6pm - Pager - [phone]  After 6pm go to [email or web address] - password [email or web address] [facility]  [facility]  Office  [phone]  CC: Primary care physician; [facility], Dr Sherley Bounds

## 2015-03-22 NOTE — Progress Notes (Signed)
Patient refused to stay and wait until his portable home O2 was delivered Dr. Renae GlossWieting of patients request. Per Dr. Renae GlossWieting send patient home if he wants to go. Patient educated of the importance of having his Home O2 for exertion and traveling home. Patient still expressed his desire to go home now. Patient stated they will contact homecare to get his oxygen tanks. Patient discharged via wheelchair and private vehicle. IV removed and catheter intact. All discharge instructions given and patient verbalizes understanding.  prescriptions given to patient. No distress noted at time of discharge.

## 2015-03-22 NOTE — Care Management Note (Signed)
Case Management Note  Patient Details  Name: Sharilyn SitesMuhammad A Corpuz MRN: [id number] Date of Birth: 1932/07/11  Subjective/Objective:  A nebulizer machine was delivered to Dr Clydie BraunBhatti.                    Action/Plan:   Expected Discharge Date:                  Expected Discharge Plan:     In-House Referral:     Discharge planning Services  CM Consult  Post Acute Care Choice:    Choice offered to:  Patient  DME Arranged:  Oxygen DME Agency:  [facility]  HH Arranged:    HH Agency:     Status of Service:  In process, will continue to follow  Medicare Important Message Given:    Date Medicare IM Given:    Medicare IM give by:    Date Additional Medicare IM Given:    Additional Medicare Important Message give by:     If discussed at Long Length of Stay Meetings, dates discussed:    Additional Comments:  Campbell Kray A, RN 03/22/2015, 1:41 PM

## 2015-03-25 ENCOUNTER — Other Ambulatory Visit: Payer: Self-pay | Admitting: Internal Medicine

## 2015-03-25 DIAGNOSIS — R0602 Shortness of breath: Secondary | ICD-10-CM

## 2015-03-25 LAB — ANA COMPREHENSIVE PANEL
Anti JO-1: <0.2 AI (ref 0.0–0.9)
Chromatin Ab SerPl-aCnc: <0.2 AI (ref 0.0–0.9)
ENA SM Ab Ser-aCnc: <0.2 AI (ref 0.0–0.9)
RIBONUCLEIC PROTEIN: 0.2 AI (ref 0.0–0.9)
Scleroderma (Scl-70) (ENA) Antibody, IgG: 1.6 AI — ABNORMAL HIGH (ref 0.0–0.9)
ds DNA Ab: 21 IU/mL — ABNORMAL HIGH (ref 0–9)

## 2015-03-25 LAB — QUANTIFERON IN TUBE
QFT TB AG MINUS NIL VALUE: 0.04 [IU]/mL
QUANTIFERON MITOGEN VALUE: 0.35 [IU]/mL
QUANTIFERON TB AG VALUE: 0.07 [IU]/mL
QUANTIFERON TB GOLD: UNDETERMINED
Quantiferon Nil Value: 0.03 IU/mL

## 2015-03-25 LAB — ANA W/REFLEX IF POSITIVE: Anti Nuclear Antibody(ANA): POSITIVE — AB

## 2015-03-25 LAB — ENA+DNA/DS+ANTICH+CENTRO+JO...
ENA SM Ab Ser-aCnc: <0.2 AI (ref 0.0–0.9)
Ribonucleic Protein: 0.3 AI (ref 0.0–0.9)
Scleroderma (Scl-70) (ENA) Antibody, IgG: 1.3 AI — ABNORMAL HIGH (ref 0.0–0.9)
ds DNA Ab: 21 IU/mL — ABNORMAL HIGH (ref 0–9)

## 2015-03-25 LAB — ANCA TITERS

## 2015-03-25 LAB — GLOMERULAR BASEMENT MEMBRANE ANTIBODIES: GBM Ab: 5 units (ref 0–20)

## 2015-03-25 LAB — QUANTIFERON TB GOLD ASSAY (BLOOD)

## 2015-03-26 ENCOUNTER — Ambulatory Visit
Admission: RE | Admit: 2015-03-26 | Discharge: 2015-03-26 | Disposition: A | Discharge status: Home / Self Care | Payer: Medicare Other | Source: Ambulatory Visit | Attending: Internal Medicine | Admitting: Internal Medicine

## 2015-03-26 DIAGNOSIS — R0602 Shortness of breath: Secondary | ICD-10-CM | POA: Insufficient documentation

## 2015-03-26 DIAGNOSIS — I2510 Atherosclerotic heart disease of native coronary artery without angina pectoris: Secondary | ICD-10-CM | POA: Diagnosis not present

## 2015-03-26 DIAGNOSIS — R918 Other nonspecific abnormal finding of lung field: Secondary | ICD-10-CM | POA: Diagnosis not present

## 2015-03-28 ENCOUNTER — Encounter: Payer: Self-pay | Admitting: Cardiovascular Disease

## 2015-04-01 ENCOUNTER — Other Ambulatory Visit: Payer: Medicare Other

## 2015-04-07 DIAGNOSIS — J84112 Idiopathic pulmonary fibrosis: Secondary | ICD-10-CM | POA: Diagnosis not present

## 2015-04-07 DIAGNOSIS — G4733 Obstructive sleep apnea (adult) (pediatric): Secondary | ICD-10-CM | POA: Diagnosis not present

## 2015-04-07 DIAGNOSIS — M329 Systemic lupus erythematosus, unspecified: Secondary | ICD-10-CM | POA: Diagnosis not present

## 2015-04-09 DIAGNOSIS — R0602 Shortness of breath: Secondary | ICD-10-CM | POA: Diagnosis not present

## 2015-04-11 ENCOUNTER — Other Ambulatory Visit: Payer: Self-pay | Admitting: Family Medicine

## 2015-04-11 DIAGNOSIS — R768 Other specified abnormal immunological findings in serum: Secondary | ICD-10-CM | POA: Diagnosis not present

## 2015-04-11 DIAGNOSIS — J84112 Idiopathic pulmonary fibrosis: Secondary | ICD-10-CM | POA: Diagnosis not present

## 2015-04-11 MED ORDER — MOMETASONE FURO-FORMOTEROL FUM 100-5 MCG/ACT IN AERO: 2.0000 | INHALATION_SPRAY | Freq: Two times a day (BID) | RESPIRATORY_TRACT | Status: DC

## 2015-04-11 NOTE — Telephone Encounter (Signed)
Patient is requesting to have Dulera Inhaler 1 puff twice daily sent to [facility].  Patient stated that his insurance will require a prior authorization for this.  Please call patient once complete @ [phone].

## 2015-04-17 ENCOUNTER — Institutional Professional Consult (permissible substitution): Payer: Medicare Other | Admitting: Pulmonary Disease

## 2015-04-22 ENCOUNTER — Encounter: Payer: Medicare Other | Attending: Internal Medicine | Admitting: Respiratory Therapy

## 2015-04-22 VITALS — Ht 67.0 in | Wt 164.5 lb

## 2015-04-22 DIAGNOSIS — J8410 Pulmonary fibrosis, unspecified: Secondary | ICD-10-CM | POA: Diagnosis not present

## 2015-04-22 DIAGNOSIS — J9601 Acute respiratory failure with hypoxia: Secondary | ICD-10-CM | POA: Diagnosis not present

## 2015-04-22 NOTE — Progress Notes (Addendum)
Pulmonary Individual Treatment Plan  Patient Details  Name: Vincent Kennedy MRN: [id number] Date of Birth: Jan 18, 1933 Referring Provider:  Yevonne Pax, MD  Initial Encounter Date: Date: 04/22/15  Visit Diagnosis: Pulmonary fibrosis (HCC)  Patient's Home Medications on Admission:  Current outpatient prescriptions:  .  albuterol (PROVENTIL HFA;VENTOLIN HFA) 108 (90 Base) MCG/ACT inhaler, Inhale 2 puffs into the lungs every 6 (six) hours as needed for wheezing or shortness of breath., Disp: 1 Inhaler, Rfl: 2 .  azithromycin (ZITHROMAX) 250 MG tablet, One tab daily for three days, Disp: 3 each, Rfl: 0 .  carvedilol (COREG) 6.25 MG tablet, Take 1 tablet (6.25 mg total) by mouth 2 (two) times daily., Disp: 60 tablet, Rfl: 0 .  cefUROXime (CEFTIN) 500 MG tablet, Take 1 tablet (500 mg total) by mouth 2 (two) times daily with a meal., Disp: 14 tablet, Rfl: 0 .  fluticasone (FLONASE) 50 MCG/ACT nasal spray, Reported on 03/12/2015, Disp: , Rfl:  .  ipratropium-albuterol (DUONEB) 0.5-2.5 (3) MG/3ML SOLN, Take 3 mLs by nebulization every 6 (six) hours., Disp: 360 mL, Rfl: 2 .  ketorolac (ACULAR) 0.4 % SOLN, 1 drop 2 (two) times daily. , Disp: , Rfl:  .  mometasone-formoterol (DULERA) 100-5 MCG/ACT AERO, Inhale 2 puffs into the lungs 2 (two) times daily., Disp: 1 Inhaler, Rfl: 1 .  pantoprazole (PROTONIX) 40 MG tablet, Take 1 tablet (40 mg total) by mouth daily., Disp: 30 tablet, Rfl: 11 .  predniSONE (DELTASONE) 20 MG tablet, Three tablets daily until tapered off by Dr Welton Flakes, Disp: 90 tablet, Rfl: 0 .  rivastigmine (EXELON) 9.5 mg/24hr, APPLY ONE PATCH TO SKIN AND CHANGE DAILY, Disp: 60 patch, Rfl: 2 .  rosuvastatin (CRESTOR) 20 MG tablet, Take by mouth., Disp: , Rfl:  .  VIGAMOX 0.5 % ophthalmic solution, Place 1 drop into the left eye 4 (four) times daily. , Disp: , Rfl:  .  zolpidem (AMBIEN) 10 MG tablet, Take 10 mg by mouth at bedtime as needed. , Disp: , Rfl:   Past Medical History: Past  Medical History  Diagnosis Date  . Coronary artery disease     Cardiac cath September 2008: 20% left main stenosis, 40% mid LAD stenosis, 99% ostial first diagonal stenosis in a large branch and 90% ostial disease in second diagonal but the vessel was very small, 20% mid RCA stenosis with normal ejection fraction. Successful angioplasty and drug-eluting stent placement to the ostial first diagonal with a 2.5 x 15 mm Xience stent  . Hyperlipidemia   . Abnormal nuclear cardiac imaging test     Dr. [name]  . Cataract   . Sleep apnea     has not used CPAP recently.  Feels condition has improved.  . Hypertension     BP has never been high(per pt).  Metoprolol is to keep BP low because of stent (afterload)  . Shortness of breath dyspnea     with exertion  . Wears hearing aid     (sometimes)    Tobacco Use: History  Smoking status  . Never Smoker   Smokeless tobacco  . Not on file    Labs: Recent Review Flowsheet Data    Labs for ITP Cardiac and Pulmonary Rehab Latest Ref Rng 05/12/2012 07/23/2013 03/20/2015   Cholestrol 100 - 199 mg/dL 409 811 -   LDLCALC 0 - 99 mg/dL 84 72 -   HDL >91 mg/dL 47(W) 29(F) -   Trlycerides 0 - 149 mg/dL 62 55 -   PHART  7.350 - 7.450 - - 7.44   PCO2ART 32.0 - 48.0 mmHg - - 32   HCO3 21.0 - 28.0 mEq/L - - 21.7   ACIDBASEDEF 0.0 - 2.0 mmol/L - - 1.6   O2SAT - - - 93.7       ADL UCSD:     ADL UCSD      04/22/15 1300       ADL UCSD   ADL Phase Entry     SOB Score total 24     Rest 0     Walk 0     Stairs 1     Bath 0     Dress 0     Shop 2         Pulmonary Function Assessment:     Pulmonary Function Assessment - 04/22/15 1300    Pulmonary Function Tests   RV% 60 %   DLCO% 36 %   Initial Spirometry Results   FVC% 77 %   FEV1% 75 %   FEV1/FVC Ratio 75   Post Bronchodilator Spirometry Results   FVC% 77 %   FEV1% 66 %   FEV1/FVC Ratio 66   Breath   Bilateral Breath Sounds Rales;Basilar   Shortness of Breath Yes;Fear of  Shortness of Breath;Limiting activity      Exercise Target Goals: Date: 04/22/15  Exercise Program Goal: Individual exercise prescription set with THRR, safety & activity barriers. Participant demonstrates ability to understand and report RPE using BORG scale, to self-measure pulse accurately, and to acknowledge the importance of the exercise prescription.  Exercise Prescription Goal: Starting with aerobic activity 30 plus minutes a day, 3 days per week for initial exercise prescription. Provide home exercise prescription and guidelines that participant acknowledges understanding prior to discharge.  Activity Barriers & Risk Stratification:     Activity Barriers & Risk Stratification - 04/22/15 1300    Activity Barriers & Risk Stratification   Activity Barriers Shortness of Breath;Deconditioning;Muscular Weakness   Risk Stratification Moderate      6 Minute Walk:     6 Minute Walk      04/22/15 1634       6 Minute Walk   Phase Initial     Distance 1460 feet     Walk Time 6 minutes     Resting HR 65 bpm     Resting BP 126/68 mmHg     Max Ex. HR 98 bpm     Max Ex. BP 146/78 mmHg     RPE 13     Perceived Dyspnea  2     Symptoms No        Initial Exercise Prescription:     Initial Exercise Prescription - 04/22/15 1600    Date of Initial Exercise Prescription   Date 04/22/15   Oxygen   Oxygen Intermittent   Liters 4   Treadmill   MPH 2.4   Grade 0   Minutes 10   Recumbant Bike   Level 2   RPM 40   Watts 20   Minutes 10   NuStep   Level 2   Watts 40   Minutes 10   Arm Ergometer   Level 1   Watts 10   Minutes 10   Elliptical   Level 1   Speed 3   Minutes 1   REL-XR   Level 3   Watts 50   Minutes 10   T5 Nustep   Level 1   Watts 15   Minutes 10  Biostep-RELP   Level 2   Watts 40   Minutes 10   Prescription Details   Frequency (times per week) 3   Duration Progress to 30 minutes of continuous aerobic without signs/symptoms of physical  distress   Intensity   THRR REST +  30   Ratings of Perceived Exertion 11-15   Perceived Dyspnea 2-4   Progression Continue progressive overload as per policy without signs/symptoms or physical distress.   Resistance Training   Training Prescription Yes   Weight 2   Reps 10-15      Exercise Prescription Changes:   Discharge Exercise Prescription (Final Exercise Prescription Changes):    Nutrition:  Target Goals: Understanding of nutrition guidelines, daily intake of sodium 1500mg , cholesterol 200mg , calories 30% from fat and 7% or less from saturated fats, daily to have 5 or more servings of fruits and vegetables.  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 He has recently lost about 10lbs by cutting his portion amounts. He does have a garden and eats a lot of vegetables. He drinks a mixture of juice and water and drink  8-9 glasses a day.      Nutrition Discharge: Rate Your Plate Scores:   Psychosocial: Target Goals: Acknowledge presence or absence of depression, maximize coping skills, provide positive support system. Participant is able to verbalize types and ability to use techniques and skills needed for reducing stress and depression.  Initial Review & Psychosocial Screening:     Initial Psych Review & Screening - 04/22/15 1300    Family Dynamics   Good Support System? 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 He is concerned with this new diagnosis of Pulmonary Fibrosis and will know more when he attends his [facility] appointment with the pulmonologist who  specializes in Interstitual diseases.   Barriers   Psychosocial barriers to participate in program The patient should benefit from training in stress management and relaxation.   Screening Interventions   Interventions Encouraged to exercise;Program counselor consult      Quality of Life Scores:     Quality of Life - 04/22/15 1300    Quality of Life Scores   Health/Function Pre 17.38 %   Socioeconomic Pre 23.57 %   Psych/Spiritual Pre 22.5 %   Family Pre 24.4 %   GLOBAL Pre 20.64 %      PHQ-9:     Recent Review Flowsheet Data    Depression screen [facility] 2/9 04/22/2015 10/16/2014   Decreased Interest 0 0   Down, Depressed, Hopeless 0 0   PHQ - 2 Score 0 0   Altered sleeping 0 -   Change in appetite 0 -   Feeling bad or failure about yourself  0 -   Trouble concentrating 0 -   Moving slowly or fidgety/restless 3 -   Suicidal thoughts 0 -   PHQ-9 Score 3 -   Difficult doing work/chores Somewhat difficult -  Psychosocial Evaluation and Intervention:   Psychosocial Re-Evaluation:  Education: Education Goals: Education classes will be provided on a weekly basis, covering required topics. Participant will state understanding/return demonstration of topics presented.  Learning Barriers/Preferences:     Learning Barriers/Preferences - 04/22/15 1300    Learning Barriers/Preferences   Learning Barriers None   Learning Preferences Group Instruction;Individual Instruction;Pictoral;Skilled Demonstration;Verbal Instruction;Written Material      Education Topics: Initial Evaluation Education: - Verbal, written and demonstration of respiratory meds, RPE/PD scales, oximetry and breathing techniques. Instruction on use of nebulizers and MDIs: cleaning and proper use, rinsing mouth with steroid doses and importance of monitoring MDI activations.          Pulmonary Rehab from 04/22/2015 in [facility] Cardiac and Pulmonary Rehab   Date  04/22/15   Educator  LB   Instruction Review  Code  2- meets goals/outcomes      General Nutrition Guidelines/Fats and Fiber: -Group instruction provided by verbal, written material, models and posters to present the general guidelines for heart healthy nutrition. Gives an explanation and review of dietary fats and fiber.   Controlling Sodium/Reading Food Labels: -Group verbal and written material supporting the discussion of sodium use in heart healthy nutrition. Review and explanation with models, verbal and written materials for utilization of the food label.   Exercise Physiology & Risk Factors: - Group verbal and written instruction with models to review the exercise physiology of the cardiovascular system and associated critical values. Details cardiovascular disease risk factors and the goals associated with each risk factor.   Aerobic Exercise & Resistance Training: - Gives group verbal and written discussion on the health impact of inactivity. On the components of aerobic and resistive training programs and the benefits of this training and how to safely progress through these programs.   Flexibility, Balance, General Exercise Guidelines: - Provides group verbal and written instruction on the benefits of flexibility and balance training programs. Provides general exercise guidelines with specific guidelines to those with heart or lung disease. Demonstration and skill practice provided.   Stress Management: - Provides group verbal and written instruction about the health risks of elevated stress, cause of high stress, and healthy ways to reduce stress.   Depression: - Provides group verbal and written instruction on the correlation between heart/lung disease and depressed mood, treatment options, and the stigmas associated with seeking treatment.   Exercise & Equipment Safety: - Individual verbal instruction and demonstration of equipment use and safety with use of the equipment.   Infection Prevention: - Provides  verbal and written material to individual with discussion of infection control including proper hand washing and proper equipment cleaning during exercise session.   Falls Prevention: - Provides verbal and written material to individual with discussion of falls prevention and safety.      Pulmonary Rehab from 04/22/2015 in [facility] Cardiac and Pulmonary Rehab   Date  04/22/15   Educator  LB   Instruction Review Code  2- meets goals/outcomes      Diabetes: - Individual verbal and written instruction to review signs/symptoms of diabetes, desired ranges of glucose level fasting, after meals and with exercise. Advice that pre and post exercise glucose checks will be done for 3 sessions at entry of program.   Chronic Lung Diseases: - Group verbal and written instruction to review new updates, new respiratory medications, new advancements in procedures and treatments. Provide informative websites and "800" numbers of self-education.   Lung Procedures: - Group verbal and written instruction to describe  testing methods done to diagnose lung disease. Review the outcome of test results. Describe the treatment choices: Pulmonary Function Tests, ABGs and oximetry.   Energy Conservation: - Provide group verbal and written instruction for methods to conserve energy, plan and organize activities. Instruct on pacing techniques, use of adaptive equipment and posture/positioning to relieve shortness of breath.   Triggers: - Group verbal and written instruction to review types of environmental controls: home humidity, furnaces, filters, dust mite/pet prevention, HEPA vacuums. To discuss weather changes, air quality and the benefits of nasal washing.   Exacerbations: - Group verbal and written instruction to provide: warning signs, infection symptoms, calling MD promptly, preventive modes, and value of vaccinations. Review: effective airway clearance, coughing and/or vibration techniques. Create an Public librarian.   Oxygen: - Individual and group verbal and written instruction on oxygen therapy. Includes supplement oxygen, available portable oxygen systems, continuous and intermittent flow rates, oxygen safety, concentrators, and Medicare reimbursement for oxygen.      Pulmonary Rehab from 04/22/2015 in [facility] Cardiac and Pulmonary Rehab   Date  04/22/15   Educator  LB   Instruction Review Code  2- meets goals/outcomes      Respiratory Medications: - Group verbal and written instruction to review medications for lung disease. Drug class, frequency, complications, importance of spacers, rinsing mouth after steroid MDI's, and proper cleaning methods for nebulizers.      Pulmonary Rehab from 04/22/2015 in [facility] Cardiac and Pulmonary Rehab   Date  04/22/15   Educator  LB   Instruction Review Code  2- meets goals/outcomes      AED/CPR: - Group verbal and written instruction with the use of models to demonstrate the basic use of the AED with the basic ABC's of resuscitation.   Breathing Retraining: - Provides individuals verbal and written instruction on purpose, frequency, and proper technique of diaphragmatic breathing and pursed-lipped breathing. Applies individual practice skills.      Pulmonary Rehab from 04/22/2015 in [facility] Cardiac and Pulmonary Rehab   Date  04/22/15   Educator  LB   Instruction Review Code  2- meets goals/outcomes      Anatomy and Physiology of the Lungs: - Group verbal and written instruction with the use of models to provide basic lung anatomy and physiology related to function, structure and complications of lung disease.   Heart Failure: - Group verbal and written instruction on the basics of heart failure: signs/symptoms, treatments, explanation of ejection fraction, enlarged heart and cardiomyopathy.   Sleep Apnea: - Individual verbal and written instruction to review Obstructive Sleep Apnea. Review of risk factors, methods for diagnosing and types of masks  and machines for OSA.      Pulmonary Rehab from 04/22/2015 in [facility] Cardiac and Pulmonary Rehab   Date  04/22/15   Educator  LB   Instruction Review Code  2- meets goals/outcomes      Anxiety: - Provides group, verbal and written instruction on the correlation between heart/lung disease and anxiety, treatment options, and management of anxiety.   Relaxation: - Provides group, verbal and written instruction about the benefits of relaxation for patients with heart/lung disease. Also provides patients with examples of relaxation techniques.   Knowledge Questionnaire Score:     Knowledge Questionnaire Score - 04/22/15 1300    Knowledge Questionnaire Score   Pre Score 0/10      Personal Goals and Risk Factors at Admission:     Personal Goals and Risk Factors at Admission - 04/22/15 1300  Personal Goals and Risk Factors on Admission    Weight Management Yes   Intervention Learn and follow the exercise and diet guidelines while in the program. 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 He eats a lot of vegetables and has lowered his portion amounts. He drinks 8-9 glasses of water and juice a day.   Admit Weight 164 lb 8 oz (74.617 kg)   Goal Weight 160 lb (72.576 kg)   Increase Aerobic Exercise and Physical Activity Yes   Intervention While in program, learn and follow the exercise prescription taught. Start at a low level workload and increase workload after able to maintain previous level for 30 minutes. Increase time before increasing intensity.  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 He is considering a TM or EL for home use.   Understand more about Heart/Pulmonary Disease. Yes   Intervention While in program utilize professionals for any questions, and attend the education sessions. Great websites to use are  www.americanheart.org or www.lung.org for reliable information.  Dr Clydie Braun was recently diagnosed with Pulmonary Fibrosis. He has been referred to [facility] to see a specialist and is very interested in learning more about his disease.   Develop more efficient breathing techniques such as purse lipped breathing and diaphragmatic breathing; and practicing self-pacing with activity Yes   Intervention While in program, learn and utilize the specific breathing techniques taught to you. Continue to practice and use the techniques as needed.  Instructed Dr Clydie Braun on PLB and its benefit with shortness of breath with activity.   Increase knowledge of respiratory medications and ability to use respiratory devices properly.  Yes   Intervention While in program learn and demonstrate appropriate use of your oxygen therapy by increasing flow with exertion, manage oxygen tank operation, including continuous and intermittent flow.  Understanding oxygen is a drug ordered by your physician.;While in program, learn to administer MDI, nebulizer, and spacer properly.;Learn to take respiratory medicine as ordered.;While in program, learn to Clean MDI, nebulizers, and spacers properly.  Dr Clydie Braun uses [facility] and a SVN with Albuterol .  He has oxygen: 2l/m rest and 3-4l/m activity. 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 Dr Clydie Braun wears a full face mask with his CPAP.      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

## 2015-04-22 NOTE — Patient Instructions (Signed)
Patient Instructions  Patient Details  Name: MALACHY COLEMAN MRN: [id number] Date of Birth: March 10, 1933 Referring Provider:  Yevonne Pax, MD  Below are the personal goals you chose as well as exercise and nutrition goals. Our goal is to help you keep on track towards obtaining and maintaining your goals. We will be discussing your progress on these goals with you throughout the program.  Initial Exercise Prescription:     Initial Exercise Prescription - 04/22/15 1600    Date of Initial Exercise Prescription   Date 04/22/15   Oxygen   Oxygen Intermittent   Liters 4   Treadmill   MPH 2.4   Grade 0   Minutes 10   Recumbant Bike   Level 2   RPM 40   Watts 20   Minutes 10   NuStep   Level 2   Watts 40   Minutes 10   Arm Ergometer   Level 1   Watts 10   Minutes 10   Elliptical   Level 1   Speed 3   Minutes 1   REL-XR   Level 3   Watts 50   Minutes 10   T5 Nustep   Level 1   Watts 15   Minutes 10   Biostep-RELP   Level 2   Watts 40   Minutes 10   Prescription Details   Frequency (times per week) 3   Duration Progress to 30 minutes of continuous aerobic without signs/symptoms of physical distress   Intensity   THRR REST +  30   Ratings of Perceived Exertion 11-15   Perceived Dyspnea 2-4   Progression Continue progressive overload as per policy without signs/symptoms or physical distress.   Resistance Training   Training Prescription Yes   Weight 2   Reps 10-15      Exercise Goals: Frequency: Be able to perform aerobic exercise three times per week working toward 3-5 days per week.  Intensity: Work with a perceived exertion of 11 (fairly light) - 15 (hard) as tolerated. Follow your new exercise prescription and watch for changes in prescription as you progress with the program. Changes will be reviewed with you when they are made.  Duration: You should be able to do 30 minutes of continuous aerobic exercise in addition to a 5 minute warm-up and a 5  minute cool-down routine.  Nutrition Goals: Your personal nutrition goals will be established when you do your nutrition analysis with the dietician.  The following are nutrition guidelines to follow: Cholesterol < /day Sodium < /day Fiber: Men over 50 yrs - 30 grams per day  Personal Goals:     Personal Goals and Risk Factors at Admission - 04/22/15 1300    Personal Goals and Risk Factors on Admission    Weight Management Yes   Intervention Learn and follow the exercise and diet guidelines while in the program. 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 He eats a lot of vegetables and has lowered his portion amounts. He drinks 8-9 glasses of water and juice a day.   Admit Weight 164 lb 8 oz (74.617 kg)   Goal Weight 160 lb (72.576 kg)   Increase Aerobic Exercise and Physical Activity Yes   Intervention While in program, learn and follow the exercise prescription taught. Start at a low level workload and increase workload after able to maintain  previous level for 30 minutes. Increase time before increasing intensity.  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 He is considering a TM or EL for home use.   Understand more about Heart/Pulmonary Disease. Yes   Intervention While in program utilize professionals for any questions, and attend the education sessions. Great websites to use are www.americanheart.org or www.lung.org for reliable information.  Dr Clydie Braun was recently diagnosed with Pulmonary Fibrosis. He has been referred to [facility] to see a specialist and is very interested in learning more about his disease.   Develop more efficient breathing techniques such as purse lipped breathing and diaphragmatic breathing; and practicing self-pacing with activity Yes   Intervention While in program, learn and utilize  the specific breathing techniques taught to you. Continue to practice and use the techniques as needed.  Instructed Dr Clydie Braun on PLB and its benefit with shortness of breath with activity.   Increase knowledge of respiratory medications and ability to use respiratory devices properly.  Yes   Intervention While in program learn and demonstrate appropriate use of your oxygen therapy by increasing flow with exertion, manage oxygen tank operation, including continuous and intermittent flow.  Understanding oxygen is a drug ordered by your physician.;While in program, learn to administer MDI, nebulizer, and spacer properly.;Learn to take respiratory medicine as ordered.;While in program, learn to Clean MDI, nebulizers, and spacers properly.  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  He has oxygen: 2l/m rest and 3-4l/m activity. 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 Dr Clydie Braun wears a full face mask with his CPAP.      Tobacco Use Initial Evaluation: History  Smoking status  . Never Smoker   Smokeless tobacco  . Not on file    Copy of goals given to participant.

## 2015-04-23 NOTE — Progress Notes (Signed)
Pulmonary Individual Treatment Plan  Patient Details  Name: Vincent Kennedy MRN: [id number] Date of Birth: [birth year] Referring Provider:  Yevonne Pax, MD  Initial Encounter Date: Date: 04/22/15  Visit Diagnosis: Pulmonary fibrosis (HCC) - Plan: PULMONARY REHAB 30 DAY REVIEW  Patient's Home Medications on Admission:  Current outpatient prescriptions:    albuterol (PROVENTIL HFA;VENTOLIN HFA) 108 (90 Base) MCG/ACT inhaler, Inhale 2 puffs into the lungs every 6 (six) hours as needed for wheezing or shortness of breath., Disp: 1 Inhaler, Rfl: 2   azithromycin (ZITHROMAX) 250 MG tablet, One tab daily for three days, Disp: 3 each, Rfl: 0   carvedilol (COREG) 6.25 MG tablet, Take 1 tablet (6.25 mg total) by mouth 2 (two) times daily., Disp: 60 tablet, Rfl: 0   cefUROXime (CEFTIN) 500 MG tablet, Take 1 tablet (500 mg total) by mouth 2 (two) times daily with a meal., Disp: 14 tablet, Rfl: 0   fluticasone (FLONASE) 50 MCG/ACT nasal spray, Reported on 03/12/2015, Disp: , Rfl:    ipratropium-albuterol (DUONEB) 0.5-2.5 (3) MG/3ML SOLN, Take 3 mLs by nebulization every 6 (six) hours., Disp: 360 mL, Rfl: 2   ketorolac (ACULAR) 0.4 % SOLN, 1 drop 2 (two) times daily. , Disp: , Rfl:    mometasone-formoterol (DULERA) 100-5 MCG/ACT AERO, Inhale 2 puffs into the lungs 2 (two) times daily., Disp: 1 Inhaler, Rfl: 1   pantoprazole (PROTONIX) 40 MG tablet, Take 1 tablet (40 mg total) by mouth daily., Disp: 30 tablet, Rfl: 11   predniSONE (DELTASONE) 20 MG tablet, Three tablets daily until tapered off by Dr Welton Flakes, Disp: 90 tablet, Rfl: 0   rivastigmine (EXELON) 9.5 mg/24hr, APPLY ONE PATCH TO SKIN AND CHANGE DAILY, Disp: 60 patch, Rfl: 2   rosuvastatin (CRESTOR) 20 MG tablet, Take by mouth., Disp: , Rfl:    VIGAMOX 0.5 % ophthalmic solution, Place 1 drop into the left eye 4 (four) times daily. , Disp: , Rfl:    zolpidem (AMBIEN) 10 MG tablet, Take 10 mg by mouth at bedtime as needed. , Disp: ,  Rfl:   Past Medical History: Past Medical History  Diagnosis Date   Coronary artery disease     Cardiac cath September 2008: 20% left main stenosis, 40% mid LAD stenosis, 99% ostial first diagonal stenosis in a large branch and 90% ostial disease in second diagonal but the vessel was very small, 20% mid RCA stenosis with normal ejection fraction. Successful angioplasty and drug-eluting stent placement to the ostial first diagonal with a 2.5 x 15 mm Xience stent   Hyperlipidemia    Abnormal nuclear cardiac imaging test     Dr. [name]   Cataract    Sleep apnea     has not used CPAP recently.  Feels condition has improved.   Hypertension     BP has never been high(per pt).  Metoprolol is to keep BP low because of stent (afterload)   Shortness of breath dyspnea     with exertion   Wears hearing aid     (sometimes)    Tobacco Use: History  Smoking status   Never Smoker   Smokeless tobacco   Not on file    Labs: Recent Review Flowsheet Data    Labs for ITP Cardiac and Pulmonary Rehab Latest Ref Rng 05/12/2012 07/23/2013 03/20/2015   Cholestrol 100 - 199 mg/dL 191 478 -   LDLCALC 0 - 99 mg/dL 84 72 -   HDL >29 mg/dL 56(O) 13(Y) -   Trlycerides 0 - 149  mg/dL 62 55 -   PHART 1.610 - 7.450 - - 7.44   PCO2ART 32.0 - 48.0 mmHg - - 32   HCO3 21.0 - 28.0 mEq/L - - 21.7   ACIDBASEDEF 0.0 - 2.0 mmol/L - - 1.6   O2SAT - - - 93.7       ADL UCSD:     ADL UCSD      04/22/15 1300       ADL UCSD   ADL Phase Entry     SOB Score total 24     Rest 0     Walk 0     Stairs 1     Bath 0     Dress 0     Shop 2         Pulmonary Function Assessment:     Pulmonary Function Assessment - 04/22/15 1300    Pulmonary Function Tests   RV% 60 %   DLCO% 36 %   Initial Spirometry Results   FVC% 77 %   FEV1% 75 %   FEV1/FVC Ratio 75   Post Bronchodilator Spirometry Results   FVC% 77 %   FEV1% 66 %   FEV1/FVC Ratio 66   Breath   Bilateral Breath Sounds Rales;Basilar    Shortness of Breath Yes;Fear of Shortness of Breath;Limiting activity      Exercise Target Goals: Date: 04/22/15  Exercise Program Goal: Individual exercise prescription set with THRR, safety & activity barriers. Participant demonstrates ability to understand and report RPE using BORG scale, to self-measure pulse accurately, and to acknowledge the importance of the exercise prescription.  Exercise Prescription Goal: Starting with aerobic activity 30 plus minutes a day, 3 days per week for initial exercise prescription. Provide home exercise prescription and guidelines that participant acknowledges understanding prior to discharge.  Activity Barriers & Risk Stratification:     Activity Barriers & Risk Stratification - 04/22/15 1300    Activity Barriers & Risk Stratification   Activity Barriers Shortness of Breath;Deconditioning;Muscular Weakness   Risk Stratification Moderate      6 Minute Walk:     6 Minute Walk      04/22/15 1634       6 Minute Walk   Phase Initial     Distance 1460 feet     Walk Time 6 minutes     Resting HR 65 bpm     Resting BP 126/68 mmHg     Max Ex. HR 98 bpm     Max Ex. BP 146/78 mmHg     RPE 13     Perceived Dyspnea  2     Symptoms No        Initial Exercise Prescription:     Initial Exercise Prescription - 04/22/15 1600    Date of Initial Exercise Prescription   Date 04/22/15   Oxygen   Oxygen Intermittent   Liters 4   Treadmill   MPH 2.4   Grade 0   Minutes 10   Recumbant Bike   Level 2   RPM 40   Watts 20   Minutes 10   NuStep   Level 2   Watts 40   Minutes 10   Arm Ergometer   Level 1   Watts 10   Minutes 10   Elliptical   Level 1   Speed 3   Minutes 1   REL-XR   Level 3   Watts 50   Minutes 10   T5 Nustep   Level 1  Watts 15   Minutes 10   Biostep-RELP   Level 2   Watts 40   Minutes 10   Prescription Details   Frequency (times per week) 3   Duration Progress to 30 minutes of continuous aerobic  without signs/symptoms of physical distress   Intensity   THRR REST +  30   Ratings of Perceived Exertion 11-15   Perceived Dyspnea 2-4   Progression Continue progressive overload as per policy without signs/symptoms or physical distress.   Resistance Training   Training Prescription Yes   Weight 2   Reps 10-15      Exercise Prescription Changes:   Discharge Exercise Prescription (Final Exercise Prescription Changes):    Nutrition:  Target Goals: Understanding of nutrition guidelines, daily intake of sodium 1500mg , cholesterol 200mg , calories 30% from fat and 7% or less from saturated fats, daily to have 5 or more servings of fruits and vegetables.  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 He has recently lost about 10lbs by cutting his portion amounts. He does have a garden and eats a lot of vegetables. He drinks a mixture of juice and water and drink  8-9 glasses a day.      Nutrition Discharge: Rate Your Plate Scores:   Psychosocial: Target Goals: Acknowledge presence or absence of depression, maximize coping skills, provide positive support system. Participant is able to verbalize types and ability to use techniques and skills needed for reducing stress and depression.  Initial Review & Psychosocial Screening:     Initial Psych Review & Screening - 04/22/15 1300    Family Dynamics   Good Support System? 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 He is concerned with this new diagnosis of Pulmonary Fibrosis and will know more when he attends his [facility] appointment  with the pulmonologist who specializes in Interstitual diseases.   Barriers   Psychosocial barriers to participate in program The patient should benefit from training in stress management and relaxation.   Screening Interventions   Interventions Encouraged to exercise;Program counselor consult      Quality of Life Scores:     Quality of Life - 04/22/15 1300    Quality of Life Scores   Health/Function Pre 17.38 %   Socioeconomic Pre 23.57 %   Psych/Spiritual Pre 22.5 %   Family Pre 24.4 %   GLOBAL Pre 20.64 %      PHQ-9:     Recent Review Flowsheet Data    Depression screen [facility] 2/9 04/22/2015 10/16/2014   Decreased Interest 0 0   Down, Depressed, Hopeless 0 0   PHQ - 2 Score 0 0   Altered sleeping 0 -   Change in appetite 0 -   Feeling bad or failure about yourself  0 -   Trouble concentrating 0 -   Moving slowly or fidgety/restless 3 -   Suicidal thoughts 0 -   PHQ-9 Score 3 -   Difficult doing  work/chores Somewhat difficult -      Psychosocial Evaluation and Intervention:   Psychosocial Re-Evaluation:  Education: Education Goals: Education classes will be provided on a weekly basis, covering required topics. Participant will state understanding/return demonstration of topics presented.  Learning Barriers/Preferences:     Learning Barriers/Preferences - 04/22/15 1300    Learning Barriers/Preferences   Learning Barriers None   Learning Preferences Group Instruction;Individual Instruction;Pictoral;Skilled Demonstration;Verbal Instruction;Written Material      Education Topics: Initial Evaluation Education: - Verbal, written and demonstration of respiratory meds, RPE/PD scales, oximetry and breathing techniques. Instruction on use of nebulizers and MDIs: cleaning and proper use, rinsing mouth with steroid doses and importance of monitoring MDI activations.          Pulmonary Rehab from 04/22/2015 in [facility] Cardiac and Pulmonary Rehab   Date  04/22/15    Educator  LB   Instruction Review Code  2- meets goals/outcomes      General Nutrition Guidelines/Fats and Fiber: -Group instruction provided by verbal, written material, models and posters to present the general guidelines for heart healthy nutrition. Gives an explanation and review of dietary fats and fiber.   Controlling Sodium/Reading Food Labels: -Group verbal and written material supporting the discussion of sodium use in heart healthy nutrition. Review and explanation with models, verbal and written materials for utilization of the food label.   Exercise Physiology & Risk Factors: - Group verbal and written instruction with models to review the exercise physiology of the cardiovascular system and associated critical values. Details cardiovascular disease risk factors and the goals associated with each risk factor.   Aerobic Exercise & Resistance Training: - Gives group verbal and written discussion on the health impact of inactivity. On the components of aerobic and resistive training programs and the benefits of this training and how to safely progress through these programs.   Flexibility, Balance, General Exercise Guidelines: - Provides group verbal and written instruction on the benefits of flexibility and balance training programs. Provides general exercise guidelines with specific guidelines to those with heart or lung disease. Demonstration and skill practice provided.   Stress Management: - Provides group verbal and written instruction about the health risks of elevated stress, cause of high stress, and healthy ways to reduce stress.   Depression: - Provides group verbal and written instruction on the correlation between heart/lung disease and depressed mood, treatment options, and the stigmas associated with seeking treatment.   Exercise & Equipment Safety: - Individual verbal instruction and demonstration of equipment use and safety with use of the  equipment.   Infection Prevention: - Provides verbal and written material to individual with discussion of infection control including proper hand washing and proper equipment cleaning during exercise session.   Falls Prevention: - Provides verbal and written material to individual with discussion of falls prevention and safety.      Pulmonary Rehab from 04/22/2015 in [facility] Cardiac and Pulmonary Rehab   Date  04/22/15   Educator  LB   Instruction Review Code  2- meets goals/outcomes      Diabetes: - Individual verbal and written instruction to review signs/symptoms of diabetes, desired ranges of glucose level fasting, after meals and with exercise. Advice that pre and post exercise glucose checks will be done for 3 sessions at entry of program.   Chronic Lung Diseases: - Group verbal and written instruction to review new updates, new respiratory medications, new advancements in procedures and treatments. Provide informative websites and "800" numbers of self-education.   Lung  Procedures: - Group verbal and written instruction to describe testing methods done to diagnose lung disease. Review the outcome of test results. Describe the treatment choices: Pulmonary Function Tests, ABGs and oximetry.   Energy Conservation: - Provide group verbal and written instruction for methods to conserve energy, plan and organize activities. Instruct on pacing techniques, use of adaptive equipment and posture/positioning to relieve shortness of breath.   Triggers: - Group verbal and written instruction to review types of environmental controls: home humidity, furnaces, filters, dust mite/pet prevention, HEPA vacuums. To discuss weather changes, air quality and the benefits of nasal washing.   Exacerbations: - Group verbal and written instruction to provide: warning signs, infection symptoms, calling MD promptly, preventive modes, and value of vaccinations. Review: effective airway clearance,  coughing and/or vibration techniques. Create an Sport and exercise psychologist.   Oxygen: - Individual and group verbal and written instruction on oxygen therapy. Includes supplement oxygen, available portable oxygen systems, continuous and intermittent flow rates, oxygen safety, concentrators, and Medicare reimbursement for oxygen.      Pulmonary Rehab from 04/22/2015 in [facility] Cardiac and Pulmonary Rehab   Date  04/22/15   Educator  LB   Instruction Review Code  2- meets goals/outcomes      Respiratory Medications: - Group verbal and written instruction to review medications for lung disease. Drug class, frequency, complications, importance of spacers, rinsing mouth after steroid MDI's, and proper cleaning methods for nebulizers.      Pulmonary Rehab from 04/22/2015 in [facility] Cardiac and Pulmonary Rehab   Date  04/22/15   Educator  LB   Instruction Review Code  2- meets goals/outcomes      AED/CPR: - Group verbal and written instruction with the use of models to demonstrate the basic use of the AED with the basic ABC's of resuscitation.   Breathing Retraining: - Provides individuals verbal and written instruction on purpose, frequency, and proper technique of diaphragmatic breathing and pursed-lipped breathing. Applies individual practice skills.      Pulmonary Rehab from 04/22/2015 in [facility] Cardiac and Pulmonary Rehab   Date  04/22/15   Educator  LB   Instruction Review Code  2- meets goals/outcomes      Anatomy and Physiology of the Lungs: - Group verbal and written instruction with the use of models to provide basic lung anatomy and physiology related to function, structure and complications of lung disease.   Heart Failure: - Group verbal and written instruction on the basics of heart failure: signs/symptoms, treatments, explanation of ejection fraction, enlarged heart and cardiomyopathy.   Sleep Apnea: - Individual verbal and written instruction to review Obstructive Sleep Apnea. Review of  risk factors, methods for diagnosing and types of masks and machines for OSA.      Pulmonary Rehab from 04/22/2015 in [facility] Cardiac and Pulmonary Rehab   Date  04/22/15   Educator  LB   Instruction Review Code  2- meets goals/outcomes      Anxiety: - Provides group, verbal and written instruction on the correlation between heart/lung disease and anxiety, treatment options, and management of anxiety.   Relaxation: - Provides group, verbal and written instruction about the benefits of relaxation for patients with heart/lung disease. Also provides patients with examples of relaxation techniques.   Knowledge Questionnaire Score:     Knowledge Questionnaire Score - 04/22/15 1300    Knowledge Questionnaire Score   Pre Score 0/10      Personal Goals and Risk Factors at Admission:     Personal Goals and  Risk Factors at Admission - 04/22/15 1300    Personal Goals and Risk Factors on Admission    Weight Management Yes   Intervention Learn and follow the exercise and diet guidelines while in the program. 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 He eats a lot of vegetables and has lowered his portion amounts. He drinks 8-9 glasses of water and juice a day.   Admit Weight 164 lb 8 oz (74.617 kg)   Goal Weight 160 lb (72.576 kg)   Increase Aerobic Exercise and Physical Activity Yes   Intervention While in program, learn and follow the exercise prescription taught. Start at a low level workload and increase workload after able to maintain previous level for 30 minutes. Increase time before increasing intensity.  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 He is considering a TM or EL for home use.   Understand more about Heart/Pulmonary Disease. Yes   Intervention While in program utilize professionals for any questions, and  attend the education sessions. Great websites to use are www.americanheart.org or www.lung.org for reliable information.  Dr Clydie Braun was recently diagnosed with Pulmonary Fibrosis. He has been referred to [facility] to see a specialist and is very interested in learning more about his disease.   Improve shortness of breath with ADL's Yes  shortness of breath. He will benefit with monitoring his O2Sats and pacing.   Intervention --  Dr Clydie Braun has modified his activity with his increased shortness of breath. Family and friends noticed it, but Dr Clydie Braun  did not  address the issue. Now that he has been diagonised with Pulmonary fibrosis, he can look back and see his increased    Develop more efficient breathing techniques such as purse lipped breathing and diaphragmatic breathing; and practicing self-pacing with activity Yes   Intervention While in program, learn and utilize the specific breathing techniques taught to you. Continue to practice and use the techniques as needed.  Instructed Dr Clydie Braun on PLB and its benefit with shortness of breath with activity.   Increase knowledge of respiratory medications and ability to use respiratory devices properly.  Yes   Intervention While in program learn and demonstrate appropriate use of your oxygen therapy by increasing flow with exertion, manage oxygen tank operation, including continuous and intermittent flow.  Understanding oxygen is a drug ordered by your physician.;While in program, learn to administer MDI, nebulizer, and spacer properly.;Learn to take respiratory medicine as ordered.;While in program, learn to Clean MDI, nebulizers, and spacers properly.  Dr Clydie Braun uses [facility] and a SVN with Albuterol .  He has oxygen: 2l/m rest and 3-4l/m activity. 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 Dr Clydie Braun wears a full face mask with his CPAP.      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

## 2015-04-25 ENCOUNTER — Encounter: Payer: Medicare Other | Attending: Internal Medicine | Admitting: *Deleted

## 2015-04-25 DIAGNOSIS — J8410 Pulmonary fibrosis, unspecified: Secondary | ICD-10-CM | POA: Diagnosis not present

## 2015-04-25 NOTE — Progress Notes (Signed)
Daily Session Note  Patient Details  Name: Vincent Kennedy MRN: [id number] Date of Birth: 1932/05/26 Referring Provider:  Allyne Gee, MD  Encounter Date: 04/25/2015  Check In:     Session Check In - 04/25/15 1015    Check-In   Staff Present Candiss Norse, MS, ACSM CEP, Exercise Physiologist;Stacey Blanch Media, RRT, RCP, Respiratory Therapist;Susanne Bice, RN, BSN, [place]   ER physicians immediately available to respond to emergencies [name] immediately available ER MD   Physician(s) Cinda Quest and Qual[place]   Medication changes reported     No   Fall or balance concerns reported    No   Warm-up and Cool-down Performed on first and last piece of equipment   VAD Patient? No   Pain Assessment   Currently in Pain? No/denies   Multiple Pain Sites No           Exercise Prescription Changes - 04/25/15 1000    Response to Exercise   Symptoms None   Comments First day of exercise! Patient was oriented to the gym and the equipment functions and settings. Procedures and policies of the gym were outlined and explained. The patient's individual exercise prescription and treatment plan were reviewed with them. All starting workloads were established based on the results of the functional testing  done at the initial intake visit. The plan for exercise progression was also introduced and progression will be customized based on the patient's performance and goals.    Duration Progress to 30 minutes of continuous aerobic without signs/symptoms of physical distress   Intensity Rest + 30   Progression Continue progressive overload as per policy without signs/symptoms or physical distress.   Resistance Training   Training Prescription Yes   Weight 2   Reps 10-15   Treadmill   MPH 2.4   Grade 0   Minutes 10   NuStep   Level 2   Watts 40   Minutes 10   Recumbant Elliptical   Level 3   Watts 50   Minutes 10      Goals Met:  Proper associated with RPD/PD & O2 Sat Exercise tolerated  well Personal goals reviewed Queuing for purse lip breathing Strength training completed today  Goals Unmet:  Not Applicable  Goals Comments: Patient completed exercise prescription and all exercise goals during rehab session. The exercise was tolerated well and the patient is progressing in the program.    Dr. Emily Filbert is Medical Director for [place] and [name].

## 2015-04-28 ENCOUNTER — Encounter: Payer: Medicare Other | Admitting: *Deleted

## 2015-04-28 DIAGNOSIS — J8410 Pulmonary fibrosis, unspecified: Secondary | ICD-10-CM

## 2015-04-28 NOTE — Progress Notes (Signed)
Daily Session Note  Patient Details  Name: Vincent Kennedy MRN: [id number] Date of Birth: 1932/12/11 Referring Provider:  Allyne Gee, MD  Encounter Date: 04/28/2015  Check In:     Session Check In - 04/28/15 1018    Check-In   Staff Present Candiss Norse, MS, ACSM CEP, Exercise Physiologist;Laureen Owens Shark, BS, RRT, Respiratory Bertis Ruddy, BS, ACSM CEP, Exercise Physiologist   Supervising physician immediately available to respond to emergencies [name] immediately available ER MD   Physician(s) Clearnce Hasten and Paduchowski   Medication changes reported     No   Fall or balance concerns reported    No   Warm-up and Cool-down Performed on first and last piece of equipment   Resistance Training Performed No   VAD Patient? No   Pain Assessment   Currently in Pain? No/denies   Multiple Pain Sites No         Goals Met:  Proper associated with RPD/PD & O2 Sat Independence with exercise equipment Using PLB without cueing & demonstrates good technique Exercise tolerated well Strength training completed today  Goals Unmet:  Not Applicable  Goals Comments: Patient completed exercise prescription and all exercise goals during rehab session. The exercise was tolerated well and the patient is progressing in the program.    Dr. Emily Filbert is Medical Director for [place] and [name].

## 2015-04-30 ENCOUNTER — Encounter: Payer: Medicare Other | Admitting: *Deleted

## 2015-04-30 DIAGNOSIS — J8410 Pulmonary fibrosis, unspecified: Secondary | ICD-10-CM

## 2015-04-30 NOTE — Progress Notes (Signed)
Daily Session Note  Patient Details  Name: RYLAND TUNGATE MRN: [id number] Date of Birth: Dec 10, 1932 Referring Provider:  Allyne Gee, MD  Encounter Date: 04/30/2015  Check In:     Session Check In - 04/30/15 1114    Check-In   Staff Present Heath Lark, RN, BSN, CCRP;Laureen Owens Shark, BS, RRT, Respiratory Griffin Basil, MS, ACSM CEP, Exercise Physiologist   Supervising physician immediately available to respond to emergencies [name] immediately available ER MD   Physician(s) Drs: Archie Balboa and Burlene Arnt   Medication changes reported     No   Fall or balance concerns reported    No   Warm-up and Cool-down Performed on first and last piece of equipment   Resistance Training Performed No   VAD Patient? No   Pain Assessment   Currently in Pain? No/denies           Exercise Prescription Changes - 04/30/15 1100    Resistance Training   Training Prescription Yes   Weight 4   Reps 10-15   Treadmill   MPH 2.8   Grade 0   Minutes 15   NuStep   Level 3   Watts 40   Minutes 15      Goals Met:  Exercise tolerated well Personal goals reviewed Strength training completed today  Goals Unmet:  Not Applicable  Goals Comments: Doing well with exercise prescription progression.    Dr. Emily Filbert is Medical Director for [place] and [name].

## 2015-05-02 ENCOUNTER — Encounter: Payer: Medicare Other | Admitting: *Deleted

## 2015-05-02 DIAGNOSIS — J8410 Pulmonary fibrosis, unspecified: Secondary | ICD-10-CM

## 2015-05-02 NOTE — Progress Notes (Signed)
Daily Session Note  Patient Details  Name: Vincent Kennedy MRN: [id number] Date of Birth: [birth year] Referring Provider:  Allyne Gee, MD  Encounter Date: 05/02/2015  Check In:     Session Check In - 05/02/15 1034    Check-In   Staff Present Frederich Cha, RRT, RCP, Respiratory Therapist;Taylour Lietzke Dillard Essex, MS, ACSM CEP, Exercise Physiologist;Susanne Bice, RN, BSN, [place] physician immediately available to respond to emergencies [name] immediately available ER MD   Physician(s) Edd Fabian and Jacqualine Code   Medication changes reported     No   Fall or balance concerns reported    No   Warm-up and Cool-down Performed on first and last piece of equipment   Resistance Training Performed Yes   VAD Patient? No   Pain Assessment   Currently in Pain? No/denies   Multiple Pain Sites No         Goals Met:  Proper associated with RPD/PD & O2 Sat Independence with exercise equipment Using PLB without cueing & demonstrates good technique Exercise tolerated well Strength training completed today  Goals Unmet:  Not Applicable  Goals Comments: Patient completed exercise prescription and all exercise goals during rehab session. The exercise was tolerated well and the patient is progressing in the program.    Dr. Emily Filbert is Medical Director for [place] and [name].

## 2015-05-05 ENCOUNTER — Encounter: Payer: Medicare Other | Admitting: *Deleted

## 2015-05-05 DIAGNOSIS — J8410 Pulmonary fibrosis, unspecified: Secondary | ICD-10-CM | POA: Diagnosis not present

## 2015-05-05 NOTE — Progress Notes (Signed)
Daily Session Note  Patient Details  Name: Vincent Kennedy MRN: [id number] Date of Birth: 27-Oct-1932 Referring Provider:  Allyne Gee, MD  Encounter Date: 05/05/2015  Check In:     Session Check In - 05/05/15 1029    Check-In   Location [facility]   Staff Present Earlean Shawl, BS, ACSM CEP, Exercise Physiologist;Laureen Janell Quiet, RRT, Respiratory Therapist   Supervising physician immediately available to respond to emergencies [name] immediately available ER MD   Physician(s) Mariea Clonts and Corky Downs   Medication changes reported     No   Fall or balance concerns reported    No   Warm-up and Cool-down Performed on first and last piece of equipment   Resistance Training Performed Yes   VAD Patient? No   Pain Assessment   Currently in Pain? No/denies   Multiple Pain Sites No           Exercise Prescription Changes - 05/05/15 1000    Response to Exercise   Symptoms none   Comments Increases in exercise prescription were reviewed with the patient. He tolerated the increase with no signs and symptoms.    Duration Progress to 30 minutes of continuous aerobic without signs/symptoms of physical distress   Intensity Rest + 30   Progression Continue progressive overload as per policy without signs/symptoms or physical distress.   Resistance Training   Training Prescription Yes   Weight 4   Reps 10-15   Treadmill   MPH 2.8   Grade 0   Minutes 15   NuStep   Level 3   Watts 40   Minutes 15   Recumbant Elliptical   Level 4   RPM 35   Minutes 15      Goals Met:  Proper associated with RPD/PD & O2 Sat Independence with exercise equipment Exercise tolerated well Personal goals reviewed Strength training completed today  Goals Unmet:  Not Applicable  Goals Comments: Reviewed individualized exercise prescription and made increases per departmental policy. Exercise increases were discussed with the patient and they were able to perform the new work  loads without issue (no signs or symptoms).     Dr. Emily Filbert is Medical Director for [place] and [name].

## 2015-05-07 ENCOUNTER — Encounter: Payer: Medicare Other | Admitting: *Deleted

## 2015-05-07 DIAGNOSIS — J8410 Pulmonary fibrosis, unspecified: Secondary | ICD-10-CM

## 2015-05-07 NOTE — Progress Notes (Signed)
Daily Session Note  Patient Details  Name: LESHAWN STRAKA MRN: [id number] Date of Birth: [birth year] Referring Provider:  Allyne Gee, MD  Encounter Date: 05/07/2015  Check In:     Session Check In - 05/07/15 1057    Check-In   Staff Present Carson Myrtle, BS, RRT, Respiratory Therapist;Renee Dillard Essex, MS, ACSM CEP, Exercise Physiologist;Dejon Lukas, RN, BSN, [place] physician immediately available to respond to emergencies [name] immediately available ER MD   Physician(s) Drs: Jimmye Norman and Mariea Clonts   Medication changes reported     No   Fall or balance concerns reported    No   Warm-up and Cool-down Performed on first and last piece of equipment   Resistance Training Performed No   VAD Patient? No   Pain Assessment   Currently in Pain? No/denies         Goals Met:  Exercise tolerated well Strength training completed today  Goals Unmet:  Not Applicable  Goals Comments: Doing well with exercise prescription progression.    Dr. Emily Filbert is Medical Director for [place] and [name].

## 2015-05-08 ENCOUNTER — Ambulatory Visit: Payer: Medicare Other | Admitting: Cardiovascular Disease

## 2015-05-09 ENCOUNTER — Encounter: Payer: Medicare Other | Admitting: *Deleted

## 2015-05-09 DIAGNOSIS — J8410 Pulmonary fibrosis, unspecified: Secondary | ICD-10-CM | POA: Diagnosis not present

## 2015-05-09 NOTE — Progress Notes (Signed)
Daily Session Note  Patient Details  Name: Vincent Kennedy MRN: [id number] Date of Birth: 06/26/1932 Referring Provider:  Khan, Saadat A, MD  Encounter Date: 05/09/2015  Check In:     Session Check In - 05/09/15 1048    Check-In   Location [facility]   Staff Present  , MS, ACSM CEP, Exercise Physiologist;Susanne Bice, RN, BSN, CCRP;Stacey Joyce, RRT, RCP, Respiratory Therapist   Supervising physician immediately available to respond to emergencies [name] immediately available ER MD   Physician(s) [place]ley and Williams   Medication changes reported     No   Fall or balance concerns reported    No   Warm-up and Cool-down Performed on first and last piece of equipment   Resistance Training Performed Yes   VAD Patient? No   Pain Assessment   Currently in Pain? No/denies   Multiple Pain Sites No         Goals Met:  Proper associated with RPD/PD & O2 Sat Independence with exercise equipment Using PLB without cueing & demonstrates good technique Exercise tolerated well Strength training completed today  Goals Unmet:  Not Applicable  Goals Comments: Patient completed exercise prescription and all exercise goals during rehab session. The exercise was tolerated well and the patient is progressing in the program.    Dr. Mark Miller is Medical Director for [facility] and [name]. 

## 2015-05-12 ENCOUNTER — Encounter: Payer: Medicare Other | Admitting: *Deleted

## 2015-05-12 DIAGNOSIS — J8410 Pulmonary fibrosis, unspecified: Secondary | ICD-10-CM

## 2015-05-12 NOTE — Progress Notes (Signed)
Daily Session Note  Patient Details  Name: Vincent Kennedy MRN: [id number] Date of Birth: 08-May-1932 Referring Provider:  Allyne Gee, MD  Encounter Date: 05/12/2015  Check In:     Session Check In - 05/12/15 1023    Check-In   Location [facility]   Staff Present Carson Myrtle, BS, RRT, Respiratory Therapist;Steven Way, BS, ACSM EP-C, Exercise Physiologist;Cesar Alf Dillard Essex, MS, ACSM CEP, Exercise Physiologist;Kelly Amedeo Plenty, BS, ACSM CEP, Exercise Physiologist   Supervising physician immediately available to respond to emergencies [name] immediately available ER MD   Physician(s) Owens Shark and Reita Cliche   Medication changes reported     No   Fall or balance concerns reported    No   Warm-up and Cool-down Performed on first and last piece of equipment   Resistance Training Performed Yes   VAD Patient? No   Pain Assessment   Currently in Pain? No/denies   Multiple Pain Sites No           Exercise Prescription Changes - 05/12/15 1000    Exercise Review   Progression Yes   Response to Exercise   Symptoms None   Comments Reviewed individualized exercise prescription and made increases per departmental policy. Exercise increases were discussed with the patient and they were able to perform the new work loads without issue (no signs or symptoms).    Duration Progress to 30 minutes of continuous aerobic without signs/symptoms of physical distress   Intensity Rest + 30   Progression Continue progressive overload as per policy without signs/symptoms or physical distress.   Resistance Training   Training Prescription Yes   Weight 4   Reps 10-15   Treadmill   MPH 2.8   Grade 0   Minutes 15   NuStep   Level 4   Watts 40   Minutes 15   Recumbant Elliptical   Level 4   RPM 35   Minutes 15   REL-XR   Level 3   Watts 50   Minutes 15      Goals Met:  Proper associated with RPD/PD & O2 Sat Independence with exercise equipment Using PLB without cueing &  demonstrates good technique Exercise tolerated well Personal goals reviewed Strength training completed today  Goals Unmet:  Not Applicable  Goals Comments: Patient completed exercise prescription and all exercise goals during rehab session. The exercise was tolerated well and the patient is progressing in the program.    Dr. Emily Filbert is Medical Director for [place] and [name].

## 2015-05-12 NOTE — Progress Notes (Signed)
Pulmonary Individual Treatment Plan  Patient Details  Name: Vincent Kennedy MRN: [id number] Date of Birth: 1933-02-13 Referring Provider:  Allyne Gee, MD  Initial Encounter Date:  04/22/15  Visit Diagnosis: Pulmonary fibrosis ([place])  Patient's Home Medications on Admission:  Current outpatient prescriptions:  .  albuterol (PROVENTIL HFA;VENTOLIN HFA) 108 (90 Base) MCG/ACT inhaler, Inhale 2 puffs into the lungs every 6 (six) hours as needed for wheezing or shortness of breath., Disp: 1 Inhaler, Rfl: 2 .  azithromycin (ZITHROMAX) 250 MG tablet, One tab daily for three days, Disp: 3 each, Rfl: 0 .  carvedilol (COREG) 6.25 MG tablet, Take 1 tablet (6.25 mg total) by mouth 2 (two) times daily., Disp: 60 tablet, Rfl: 0 .  cefUROXime (CEFTIN) 500 MG tablet, Take 1 tablet (500 mg total) by mouth 2 (two) times daily with a meal., Disp: 14 tablet, Rfl: 0 .  fluticasone (FLONASE) 50 MCG/ACT nasal spray, Reported on 03/12/2015, Disp: , Rfl:  .  ipratropium-albuterol (DUONEB) 0.5-2.5 (3) MG/3ML SOLN, Take 3 mLs by nebulization every 6 (six) hours., Disp: 360 mL, Rfl: 2 .  ketorolac (ACULAR) 0.4 % SOLN, 1 drop 2 (two) times daily. , Disp: , Rfl:  .  mometasone-formoterol (DULERA) 100-5 MCG/ACT AERO, Inhale 2 puffs into the lungs 2 (two) times daily., Disp: 1 Inhaler, Rfl: 1 .  pantoprazole (PROTONIX) 40 MG tablet, Take 1 tablet (40 mg total) by mouth daily., Disp: 30 tablet, Rfl: 11 .  predniSONE (DELTASONE) 20 MG tablet, Three tablets daily until tapered off by Dr [name], Disp: 90 tablet, Rfl: 0 .  rivastigmine (EXELON) 9.5 mg/24hr, APPLY ONE PATCH TO SKIN AND CHANGE DAILY, Disp: 60 patch, Rfl: 2 .  rosuvastatin (CRESTOR) 20 MG tablet, Take by mouth., Disp: , Rfl:  .  VIGAMOX 0.5 % ophthalmic solution, Place 1 drop into the left eye 4 (four) times daily. , Disp: , Rfl:  .  zolpidem (AMBIEN) 10 MG tablet, Take 10 mg by mouth at bedtime as needed. , Disp: , Rfl:   Past Medical History: Past Medical  History  Diagnosis Date  . Coronary artery disease     Cardiac cath September 2008: 20% left main stenosis, 40% mid LAD stenosis, 99% ostial first diagonal stenosis in a large branch and 90% ostial disease in second diagonal but the vessel was very small, 20% mid RCA stenosis with normal ejection fraction. Successful angioplasty and drug-eluting stent placement to the ostial first diagonal with a 2.5 x 15 mm Xience stent  . Hyperlipidemia   . Abnormal nuclear cardiac imaging test     Dr. Fletcher Anon  . Cataract   . Sleep apnea     has not used CPAP recently.  Feels condition has improved.  . Hypertension     BP has never been high(per pt).  Metoprolol is to keep BP low because of stent (afterload)  . Shortness of breath dyspnea     with exertion  . Wears hearing aid     (sometimes)    Tobacco Use: History  Smoking status  . Never Smoker   Smokeless tobacco  . Not on file    Labs: Recent Review Flowsheet Data    Labs for ITP Cardiac and Pulmonary Rehab Latest Ref Rng 05/12/2012 07/23/2013 03/20/2015   Cholestrol 100 - 199 mg/dL 130 122 -   LDLCALC 0 - 99 mg/dL 84 72 -   HDL >39 mg/dL 34(L) 39(L) -   Trlycerides 0 - 149 mg/dL 62 55 -   PHART  7.350 - 7.450 - - 7.44   PCO2ART 32.0 - 48.0 mmHg - - 32   HCO3 21.0 - 28.0 mEq/L - - 21.7   ACIDBASEDEF 0.0 - 2.0 mmol/L - - 1.6   O2SAT - - - 93.7       ADL UCSD:     ADL UCSD      04/22/15 1300       ADL UCSD   ADL Phase Entry     SOB Score total 24     Rest 0     Walk 0     Stairs 1     Bath 0     Dress 0     Shop 2         Pulmonary Function Assessment:     Pulmonary Function Assessment - 04/22/15 1300    Pulmonary Function Tests   RV% 60 %   DLCO% 36 %   Initial Spirometry Results   FVC% 77 %   FEV1% 75 %   FEV1/FVC Ratio 75   Post Bronchodilator Spirometry Results   FVC% 77 %   FEV1% 66 %   FEV1/FVC Ratio 66   Breath   Bilateral Breath Sounds Rales;Basilar   Shortness of Breath Yes;Fear of Shortness of  Breath;Limiting activity      Exercise Target Goals:    Exercise Program Goal: Individual exercise prescription set with THRR, safety & activity barriers. Participant demonstrates ability to understand and report RPE using BORG scale, to self-measure pulse accurately, and to acknowledge the importance of the exercise prescription.  Exercise Prescription Goal: Starting with aerobic activity 30 plus minutes a day, 3 days per week for initial exercise prescription. Provide home exercise prescription and guidelines that participant acknowledges understanding prior to discharge.  Activity Barriers & Risk Stratification:     Activity Barriers & Cardiac Risk Stratification - 04/22/15 1300    Activity Barriers & Cardiac Risk Stratification   Activity Barriers Shortness of Breath;Deconditioning;Muscular Weakness   Cardiac Risk Stratification Moderate      6 Minute Walk:     6 Minute Walk      04/22/15 1634       6 Minute Walk   Phase Initial     Distance 1460 feet     Walk Time 6 minutes     RPE 13     Perceived Dyspnea  2     Symptoms No     Resting HR 65 bpm     Resting BP 126/68 mmHg     Max Ex. HR 98 bpm     Max Ex. BP 146/78 mmHg        Initial Exercise Prescription:     Initial Exercise Prescription - 04/22/15 1600    Date of Initial Exercise Prescription   Date 04/22/15   Oxygen   Oxygen Intermittent   Liters 4   Treadmill   MPH 2.4   Grade 0   Minutes 10   Recumbant Bike   Level 2   RPM 40   Watts 20   Minutes 10   NuStep   Level 2   Watts 40   Minutes 10   Arm Ergometer   Level 1   Watts 10   Minutes 10   Elliptical   Level 1   Speed 3   Minutes 1   REL-XR   Level 3   Watts 50   Minutes 10   T5 Nustep   Level 1   Watts 15  Minutes 10   Biostep-RELP   Level 2   Watts 40   Minutes 10   Prescription Details   Frequency (times per week) 3   Duration Progress to 30 minutes of continuous aerobic without signs/symptoms of physical  distress   Intensity   THRR REST +  30   Ratings of Perceived Exertion 11-15   Perceived Dyspnea 2-4   Progression Continue progressive overload as per policy without signs/symptoms or physical distress.   Resistance Training   Training Prescription Yes   Weight 2   Reps 10-15      Exercise Prescription Changes:     Exercise Prescription Changes      04/25/15 1000 04/25/15 1500 04/30/15 1100 05/05/15 1000 05/07/15 1200   Response to Exercise   Blood Pressure (Admit) 124/82 mmHg    158/62 mmHg   Blood Pressure (Exercise) 146/74 mmHg    160/80 mmHg   Blood Pressure (Exit) 124/62 mmHg    98/62 mmHg   Heart Rate (Admit) 73 bpm    73 bpm   Heart Rate (Exercise) 95 bpm    100 bpm   Heart Rate (Exit) 71 bpm    83 bpm   Oxygen Saturation (Admit) 100 %    99 %   Oxygen Saturation (Exercise) 93 %    91 %   Oxygen Saturation (Exit) 99 %    99 %   Rating of Perceived Exertion (Exercise) 11    12   Perceived Dyspnea (Exercise) 2    3   Symptoms None   none Nonw   Comments First day of exercise! Patient was oriented to the gym and the equipment functions and settings. Procedures and policies of the gym were outlined and explained. The patient's individual exercise prescription and treatment plan were reviewed with them. All starting workloads were established based on the results of the functional testing  done at the initial intake visit. The plan for exercise progression was also introduced and progression will be customized based on the patient's performance and goals.    Increases in exercise prescription were reviewed with the patient. He tolerated the increase with no signs and symptoms.  Vincent Kennedy is progressing well in the program and can consistently exercise for the enite 45 minutes of aerobic exericse. We will focus progression efforts on intensity as the patient is able to tolerate them.    Duration Progress to 30 minutes of continuous aerobic without signs/symptoms of physical distress    Progress to 30 minutes of continuous aerobic without signs/symptoms of physical distress Progress to 30 minutes of continuous aerobic without signs/symptoms of physical distress   Intensity Rest + 30   Rest + 30 Rest + 30   Progression Continue progressive overload as per policy without signs/symptoms or physical distress.   Continue progressive overload as per policy without signs/symptoms or physical distress. Continue progressive overload as per policy without signs/symptoms or physical distress.   Resistance Training   Training Prescription Yes  Yes Yes Yes   Weight '2  4 4 4   '$ Reps 10-15  10-15 10-15 10-15   Treadmill   MPH 2.4 2.8 2.8 2.8 2.8   Grade 0 0 0 0 0   Minutes '10 10 15 15 15   '$ NuStep   Level '2 3 3 3 3   '$ Watts 40 40 40 40 40   Minutes '10 10 15 15 15   '$ Recumbant Elliptical   Level '3   4 4   '$ RPM  35 35   Watts 50       Minutes '10   15 15     '$ 05/12/15 1000           Exercise Review   Progression Yes       Response to Exercise   Symptoms None       Comments Reviewed individualized exercise prescription and made increases per departmental policy. Exercise increases were discussed with the patient and they were able to perform the new work loads without issue (no signs or symptoms).        Duration Progress to 30 minutes of continuous aerobic without signs/symptoms of physical distress       Intensity Rest + 30       Progression Continue progressive overload as per policy without signs/symptoms or physical distress.       Resistance Training   Training Prescription Yes       Weight 4       Reps 10-15       Treadmill   MPH 2.8       Grade 0       Minutes 15       NuStep   Level 4       Watts 40       Minutes 15       Recumbant Elliptical   Level 4       RPM 35       Minutes 15       REL-XR   Level 3       Watts 50       Minutes 15          Discharge Exercise Prescription (Final Exercise Prescription Changes):     Exercise Prescription Changes -  05/12/15 1000    Exercise Review   Progression Yes   Response to Exercise   Symptoms None   Comments Reviewed individualized exercise prescription and made increases per departmental policy. Exercise increases were discussed with the patient and they were able to perform the new work loads without issue (no signs or symptoms).    Duration Progress to 30 minutes of continuous aerobic without signs/symptoms of physical distress   Intensity Rest + 30   Progression Continue progressive overload as per policy without signs/symptoms or physical distress.   Resistance Training   Training Prescription Yes   Weight 4   Reps 10-15   Treadmill   MPH 2.8   Grade 0   Minutes 15   NuStep   Level 4   Watts 40   Minutes 15   Recumbant Elliptical   Level 4   RPM 35   Minutes 15   REL-XR   Level 3   Watts 50   Minutes 15       Nutrition:  Target Goals: Understanding of nutrition guidelines, daily intake of sodium '1500mg'$ , cholesterol '200mg'$ , calories 30% from fat and 7% or less from saturated fats, daily to have 5 or more servings of fruits and vegetables.  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 He has recently lost about 10lbs by cutting his portion amounts. He does have a garden and eats a lot of vegetables. He drinks a mixture of juice and water and drink  8-9 glasses a day.      Nutrition Discharge: Rate Your Plate Scores:   Psychosocial: Target Goals: Acknowledge presence or absence of depression, maximize coping skills, provide positive support system. Participant is able to verbalize types and  ability to use techniques and skills needed for reducing stress and depression.  Initial Review & Psychosocial Screening:     Initial Psych Review & Screening - 04/22/15 1300    Family Dynamics   Good Support System? 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 He is concerned with this new diagnosis of Pulmonary Fibrosis and will know more when he attends his [facility] appointment with the pulmonologist who specializes in Interstitual diseases.   Barriers   Psychosocial barriers to participate in program The patient should benefit from training in stress management and relaxation.   Screening Interventions   Interventions Encouraged to exercise;Program counselor consult      Quality of Life Scores:     Quality of Life - 04/22/15 1300    Quality of Life Scores   Health/Function Pre 17.38 %   Socioeconomic Pre 23.57 %   Psych/Spiritual Pre 22.5 %   Family Pre 24.4 %   GLOBAL Pre 20.64 %      PHQ-9:     Recent Review Flowsheet Data    Depression screen [facility] 2/9 04/22/2015 10/16/2014   Decreased Interest 0 0   Down, Depressed, Hopeless 0 0   PHQ - 2 Score 0 0   Altered sleeping 0 -   Change in appetite 0 -   Feeling bad or failure about yourself  0 -   Trouble concentrating 0 -   Moving slowly or fidgety/restless 3 -   Suicidal thoughts 0 -   PHQ-9 Score 3 -   Difficult doing work/chores Somewhat difficult -      Psychosocial Evaluation and Intervention:     Psychosocial Evaluation - 05/05/15 1116    Psychosocial Evaluation & Interventions   Interventions Encouraged to exercise with the program and follow exercise prescription   Comments Counselor met with Vincent Kennedy today for initial psychosocial evaluation.  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  He has a strong support system with a spouse of 39 years and close friends.   He is also actively involved in his local faith community.   Vincent Kennedy reports that he sleeps well typically and has a good appetite.  He is generally in a positive mood and denies a history or current symptoms of depression or anxiety.  Vincent Kennedy states that his health is his primary stressor currently.  He has goals to get off of his Oxygen as much and increase his stamina and strength while in this program.  He plans to purchase some home equipment to help exercise more consistently.  Counselor will continue to follow with Vincent Kennedy.    Continued Psychosocial Services Needed Yes  Consistent exercise is recommended and Vincent Kennedy will benefit from the psychoeducational components of this program.        Psychosocial Re-Evaluation:  Education: Education Goals: Education classes will be provided on a weekly basis, covering required  topics. Participant will state understanding/return demonstration of topics presented.  Learning Barriers/Preferences:     Learning Barriers/Preferences - 04/22/15 1300    Learning Barriers/Preferences   Learning Barriers None   Learning Preferences Group Instruction;Individual Instruction;Pictoral;Skilled Demonstration;Verbal Instruction;Written Material      Education Topics: Initial Evaluation Education: - Verbal, written and demonstration of respiratory meds, RPE/PD scales, oximetry and breathing techniques. Instruction on use of nebulizers and MDIs: cleaning and proper use, rinsing mouth with steroid doses and importance of monitoring MDI activations.          Pulmonary Rehab from 05/12/2015 in [facility] Cardiac and Pulmonary Rehab   Date  04/22/15   Educator  LB   Instruction Review Code  2- meets goals/outcomes      General Nutrition Guidelines/Fats and Fiber: -Group instruction provided by verbal, written material, models and posters to present the general guidelines for heart healthy nutrition. Gives an explanation and review of dietary fats and fiber.      Pulmonary Rehab from 05/12/2015 in [facility] Cardiac and  Pulmonary Rehab   Date  04/28/15   Educator  CE   Instruction Review Code  2- meets goals/outcomes      Controlling Sodium/Reading Food Labels: -Group verbal and written material supporting the discussion of sodium use in heart healthy nutrition. Review and explanation with models, verbal and written materials for utilization of the food label.      Pulmonary Rehab from 05/12/2015 in [facility] Cardiac and Pulmonary Rehab   Date  05/12/15   Educator  CR   Instruction Review Code  2- meets goals/outcomes      Exercise Physiology & Risk Factors: - Group verbal and written instruction with models to review the exercise physiology of the cardiovascular system and associated critical values. Details cardiovascular disease risk factors and the goals associated with each risk factor.   Aerobic Exercise & Resistance Training: - Gives group verbal and written discussion on the health impact of inactivity. On the components of aerobic and resistive training programs and the benefits of this training and how to safely progress through these programs.   Flexibility, Balance, General Exercise Guidelines: - Provides group verbal and written instruction on the benefits of flexibility and balance training programs. Provides general exercise guidelines with specific guidelines to those with heart or lung disease. Demonstration and skill practice provided.      Pulmonary Rehab from 05/12/2015 in [facility] Cardiac and Pulmonary Rehab   Date  05/07/15   Educator  RM   Instruction Review Code  2- meets goals/outcomes      Stress Management: - Provides group verbal and written instruction about the health risks of elevated stress, cause of high stress, and healthy ways to reduce stress.   Depression: - Provides group verbal and written instruction on the correlation between heart/lung disease and depressed mood, treatment options, and the stigmas associated with seeking treatment.   Exercise & Equipment  Safety: - Individual verbal instruction and demonstration of equipment use and safety with use of the equipment.      Pulmonary Rehab from 05/12/2015 in [facility] Cardiac and Pulmonary Rehab   Date  04/25/15   Educator  [place]   Instruction Review Code  2- meets goals/outcomes      Infection Prevention: - Provides verbal and written material to individual with discussion of infection control including proper hand washing and proper equipment cleaning during exercise session.      Pulmonary Rehab from 05/12/2015 in [facility] Cardiac and Pulmonary Rehab   Date  04/25/15   Educator  SJ   Instruction Review Code  2- meets goals/outcomes      Falls Prevention: - Provides verbal and written material to individual with discussion of falls prevention and safety.      Pulmonary Rehab from 05/12/2015 in [facility] Cardiac and Pulmonary Rehab   Date  04/22/15   Educator  LB   Instruction Review Code  2- meets goals/outcomes      Diabetes: - Individual verbal and written instruction to review signs/symptoms of diabetes, desired ranges of glucose level fasting, after meals and with exercise. Advice that pre and post exercise glucose checks will be done for 3 sessions at entry of program.   Chronic Lung Diseases: - Group verbal and written instruction to review new updates, new respiratory medications, new advancements in procedures and treatments. Provide informative websites and "800" numbers of self-education.      Pulmonary Rehab from 05/12/2015 in [facility] Cardiac and Pulmonary Rehab   Date  05/05/15   Educator  LB   Instruction Review Code  2- meets goals/outcomes      Lung Procedures: - Group verbal and written instruction to describe testing methods done to diagnose lung disease. Review the outcome of test results. Describe the treatment choices: Pulmonary Function Tests, ABGs and oximetry.      Pulmonary Rehab from 05/12/2015 in [facility] Cardiac and Pulmonary Rehab   Date  05/02/15   Educator  [place]    Instruction Review Code  2- meets goals/outcomes      Energy Conservation: - Provide group verbal and written instruction for methods to conserve energy, plan and organize activities. Instruct on pacing techniques, use of adaptive equipment and posture/positioning to relieve shortness of breath.   Triggers: - Group verbal and written instruction to review types of environmental controls: home humidity, furnaces, filters, dust mite/pet prevention, HEPA vacuums. To discuss weather changes, air quality and the benefits of nasal washing.   Exacerbations: - Group verbal and written instruction to provide: warning signs, infection symptoms, calling MD promptly, preventive modes, and value of vaccinations. Review: effective airway clearance, coughing and/or vibration techniques. Create an Sports administrator.   Oxygen: - Individual and group verbal and written instruction on oxygen therapy. Includes supplement oxygen, available portable oxygen systems, continuous and intermittent flow rates, oxygen safety, concentrators, and Medicare reimbursement for oxygen.      Pulmonary Rehab from 05/12/2015 in [facility] Cardiac and Pulmonary Rehab   Date  04/22/15   Educator  LB   Instruction Review Code  2- meets goals/outcomes      Respiratory Medications: - Group verbal and written instruction to review medications for lung disease. Drug class, frequency, complications, importance of spacers, rinsing mouth after steroid MDI's, and proper cleaning methods for nebulizers.      Pulmonary Rehab from 05/12/2015 in [facility] Cardiac and Pulmonary Rehab   Date  04/22/15   Educator  LB   Instruction Review Code  2- meets goals/outcomes      AED/CPR: - Group verbal and written instruction with the use of models to demonstrate the basic use of the AED with the basic ABC's of resuscitation.   Breathing Retraining: - Provides individuals verbal and written instruction on purpose, frequency, and proper technique of  diaphragmatic breathing and pursed-lipped breathing. Applies individual practice skills.      Pulmonary Rehab from 05/12/2015 in [facility] Cardiac and Pulmonary Rehab   Date  04/22/15   Educator  SJ   Instruction Review Code  2- meets goals/outcomes  Anatomy and Physiology of the Lungs: - Group verbal and written instruction with the use of models to provide basic lung anatomy and physiology related to function, structure and complications of lung disease.   Heart Failure: - Group verbal and written instruction on the basics of heart failure: signs/symptoms, treatments, explanation of ejection fraction, enlarged heart and cardiomyopathy.   Sleep Apnea: - Individual verbal and written instruction to review Obstructive Sleep Apnea. Review of risk factors, methods for diagnosing and types of masks and machines for OSA.      Pulmonary Rehab from 05/12/2015 in [facility] Cardiac and Pulmonary Rehab   Date  04/22/15   Educator  LB   Instruction Review Code  2- meets goals/outcomes      Anxiety: - Provides group, verbal and written instruction on the correlation between heart/lung disease and anxiety, treatment options, and management of anxiety.   Relaxation: - Provides group, verbal and written instruction about the benefits of relaxation for patients with heart/lung disease. Also provides patients with examples of relaxation techniques.   Knowledge Questionnaire Score:     Knowledge Questionnaire Score - 04/22/15 1300    Knowledge Questionnaire Score   Pre Score 0/10      Personal Goals and Risk Factors at Admission:     Personal Goals and Risk Factors at Admission - 04/22/15 1300    Personal Goals and Risk Factors on Admission    Weight Management Yes   Intervention Learn and follow the exercise and diet guidelines while in the program. 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 He eats a lot of vegetables and has lowered his portion amounts. He drinks 8-9 glasses of water and juice a day.   Admit Weight 164 lb 8 oz (74.617 kg)   Goal Weight 160 lb (72.576 kg)   Increase Aerobic Exercise and Physical Activity Yes   Intervention While in program, learn and follow the exercise prescription taught. Start at a low level workload and increase workload after able to maintain previous level for 30 minutes. Increase time before increasing intensity.  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 He is considering a TM or EL for home use.   Understand more about Heart/Pulmonary Disease. Yes   Intervention While in program utilize professionals for any questions, and attend the education sessions. Great websites to use are www.americanheart.org or www.lung.org for reliable information.  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 He has been referred to [facility] to see a specialist and is very interested in learning more about his disease.   Improve shortness of breath with ADL's Yes  shortness of breath. He will benefit with monitoring his O2Sats and pacing.   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 Family and friends noticed it, but Dr Emilio Math  did not  address the issue. Now that he has been diagonised with Pulmonary fibrosis, he can look back and see his increased    Develop more efficient breathing techniques such as purse lipped breathing and diaphragmatic breathing; and practicing self-pacing with activity Yes   Intervention While in program, learn and utilize the specific breathing techniques taught to you. Continue to practice and use the techniques as needed.  Instructed Dr Emilio Math on PLB and its benefit with shortness of breath with activity.  Increase knowledge of respiratory medications and ability to use respiratory devices properly.  Yes    Intervention While in program learn and demonstrate appropriate use of your oxygen therapy by increasing flow with exertion, manage oxygen tank operation, including continuous and intermittent flow.  Understanding oxygen is a drug ordered by your physician.;While in program, learn to administer MDI, nebulizer, and spacer properly.;Learn to take respiratory medicine as ordered.;While in program, learn to Clean MDI, nebulizers, and spacers properly.  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  He has oxygen: 2l/Kennedy rest and 3-4l/Kennedy activity. He has a Marine scientist with intermittent flow and uses [facility]. 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      Personal Goals and Risk Factors Review:      Goals and Risk Factor Review      04/25/15 1507 04/28/15 1000 04/28/15 1056 05/02/15 1415     Increase Aerobic Exercise and Physical Activity   Comments   Vincent Kennedy is on his second day of exercise in Pulmonary Rehab. We discussed his goals of wanting to be able to get back to doing yard work, gardening, and walking which are activities he enjoys. He stated that there is an incline on his driveway and several steps that he needs to climb to do these activities. We discussed how his exerciser routine can hopefully allow him to be able to do more without getting as short of breath, and eventurally get back to doing these ADL's.     Understand more about Heart/Pulmonary Disease   Goals Progress/Improvement seen   Yes      Comments  Gave Vincent Kennedy a copy of the Pulmonary Fibrosis Guide for Patients from the [employer] Association which has great educational material.      Improve shortness of breath with ADL's   Goals Progress/Improvement seen     Yes    Comments    Vincent Kennedy says that he notices less shorness of breath when walking down his driveway.  He wants to try it without his O2 but he was advised against it at this time.    Breathing Techniques   Goals Progress/Improvement seen  Yes Yes   Yes    Comments Taught PLB today. 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  He wants to try it without his O2 but he was advised against it at this time.   Breathing Techniques   Goals Progress/Improvement seen  Yes      ITP Comments:     ITP Comments      04/25/15 1020 04/25/15 1514 04/28/15 1019 04/30/15 1114 05/02/15 1037   ITP Comments Personal and exercise goals expected to be met in 34 more sessions. Progress on specific individualized goals will be charted in patient's ITP. Upon completion of the program the patient will be comfortable managing exercise goals and progression on their own.  Spoke with Vincent Kennedy today about setting goals and working on the goals while in the program. Will review nutriton goals  and set short term  and long term goals after he has met with the RD.  Personal and exercise goals expected to be met in 33 more sessions. Progress on specific individualized goals will be charted in patient's ITP. Upon completion of the program the patient will be comfortable managing exercise goals and progression on their own.  Personal and exercise goals expected to be met in 32 more sessions. Progress on specific individualized goals will be charted in patient's ITP. Upon completion of the program the patient will be comfortable managing exercise goals and progression on their own.  Personal and exercise goals expected to be met in 31 more sessions. Progress on specific individualized goals will be charted in patient's ITP. Upon completion of the program the patient will be comfortable managing exercise goals and  progression on their own.      05/02/15 1539 05/05/15 1033 05/07/15 1058 05/09/15 1054 05/12/15 1027   ITP Comments Vincent Kennedy will meet with his Pulmonologist at [facility] for the first time on February 21st. Exercise and personal goals anticipated to be met in 30 more sessions. See ITP for detailed report on goals.  Exercise and personal goals anticipated to be met in 29 more sessions. See ITP for detailed report on goals.  Personal and exercise goals expected to be met in 28 more sessions. Progress on specific individualized goals will be charted in patient's ITP. Upon completion of the program the patient will be comfortable managing exercise goals and progression on their own.  Personal and exercise goals expected to be met in 27 more sessions. Progress on specific individualized goals will be charted in patient's ITP. Upon completion of the program the patient will be comfortable managing exercise goals and progression on their own.       Comments: 30 Day Review

## 2015-05-13 DIAGNOSIS — G4730 Sleep apnea, unspecified: Secondary | ICD-10-CM | POA: Diagnosis not present

## 2015-05-13 DIAGNOSIS — R0602 Shortness of breath: Secondary | ICD-10-CM | POA: Diagnosis not present

## 2015-05-13 DIAGNOSIS — K219 Gastro-esophageal reflux disease without esophagitis: Secondary | ICD-10-CM | POA: Diagnosis not present

## 2015-05-13 DIAGNOSIS — Z23 Encounter for immunization: Secondary | ICD-10-CM | POA: Diagnosis not present

## 2015-05-13 DIAGNOSIS — Z7951 Long term (current) use of inhaled steroids: Secondary | ICD-10-CM | POA: Diagnosis not present

## 2015-05-13 DIAGNOSIS — R0609 Other forms of dyspnea: Secondary | ICD-10-CM | POA: Diagnosis not present

## 2015-05-13 DIAGNOSIS — Z9981 Dependence on supplemental oxygen: Secondary | ICD-10-CM | POA: Diagnosis not present

## 2015-05-13 DIAGNOSIS — J849 Interstitial pulmonary disease, unspecified: Secondary | ICD-10-CM | POA: Diagnosis not present

## 2015-05-13 DIAGNOSIS — Z79899 Other long term (current) drug therapy: Secondary | ICD-10-CM | POA: Diagnosis not present

## 2015-05-14 ENCOUNTER — Encounter: Payer: Medicare Other | Admitting: *Deleted

## 2015-05-14 DIAGNOSIS — J8410 Pulmonary fibrosis, unspecified: Secondary | ICD-10-CM

## 2015-05-14 NOTE — Progress Notes (Signed)
Daily Session Note  Patient Details  Name: Vincent Kennedy MRN: [id number] Date of Birth: 01-02-1933 Referring Provider:  Allyne Gee, MD  Encounter Date: 05/14/2015  Check In:     Session Check In - 05/14/15 1054    Check-In   Location [facility]   Staff Present Candiss Norse, MS, ACSM CEP, Exercise Physiologist;Steven Way, BS, ACSM EP-C, Exercise Physiologist;Laureen Owens Shark, BS, RRT, Respiratory Therapist   Supervising physician immediately available to respond to emergencies [name] immediately available ER MD   Physician(s) Owens Shark and Archie Balboa   Medication changes reported     No   Fall or balance concerns reported    No   Warm-up and Cool-down Performed on first and last piece of equipment   Resistance Training Performed Yes   VAD Patient? No   Pain Assessment   Currently in Pain? No/denies   Multiple Pain Sites No         Goals Met:  Proper associated with RPD/PD & O2 Sat Independence with exercise equipment Using PLB without cueing & demonstrates good technique Exercise tolerated well Strength training completed today  Goals Unmet:  Not Applicable  Goals Comments: Patient completed exercise prescription and all exercise goals during rehab session. The exercise was tolerated well and the patient is progressing in the program.    Dr. Emily Filbert is Medical Director for [place] and [name].

## 2015-05-16 ENCOUNTER — Encounter: Payer: Medicare Other | Admitting: *Deleted

## 2015-05-16 DIAGNOSIS — J8410 Pulmonary fibrosis, unspecified: Secondary | ICD-10-CM | POA: Diagnosis not present

## 2015-05-16 NOTE — Progress Notes (Signed)
Daily Session Note  Patient Details  Name: [name] MRN: [id number] Date of Birth: 1932-11-18 Referring Provider:  Allyne Gee, MD  Encounter Date: 05/16/2015  Check In:     Session Check In - 05/16/15 1042    Check-In   Location [facility]   Staff Present Heath Lark, RN, BSN, CCRP;Laureen Owens Shark, BS, RRT, Respiratory Griffin Basil, MS, ACSM CEP, Exercise Physiologist;Celedonio Sortino, RN, BSN   Supervising physician immediately available to respond to emergencies [name] immediately available ER MD   Physician(s) Dr. Marcelene Butte and Dr. Jimmye Norman   Medication changes reported     No   Fall or balance concerns reported    No   Warm-up and Cool-down Performed on first and last piece of equipment   Resistance Training Performed Yes   VAD Patient? No   Pain Assessment   Currently in Pain? No/denies         Goals Met:  Proper associated with RPD/PD & O2 Sat Exercise tolerated well  Goals Unmet:  Not Applicable  Goals Comments:    Dr. Emily Filbert is Medical Director for [place] and [name].

## 2015-05-19 ENCOUNTER — Encounter: Payer: Medicare Other | Admitting: *Deleted

## 2015-05-19 DIAGNOSIS — J8410 Pulmonary fibrosis, unspecified: Secondary | ICD-10-CM | POA: Diagnosis not present

## 2015-05-19 NOTE — Progress Notes (Signed)
Daily Session Note  Patient Details  Name: Vincent Kennedy MRN: [id number] Date of Birth: [birth year] Referring Provider:  Allyne Gee, MD  Encounter Date: 05/19/2015  Check In:     Session Check In - 05/19/15 1050    Check-In   Location [facility]   Staff Present Lestine Box, BS, ACSM EP-C, Exercise Physiologist;Mylon Mabey Amedeo Plenty, BS, ACSM CEP, Exercise Physiologist;Laureen Owens Shark, BS, RRT, Respiratory Therapist   Supervising physician immediately available to respond to emergencies [name] immediately available ER MD   Physician(s) Dr. Joni Fears and Dr. Clearnce Hasten   Medication changes reported     No   Fall or balance concerns reported    No   Warm-up and Cool-down Performed on first and last piece of equipment   Resistance Training Performed Yes   VAD Patient? No   Pain Assessment   Currently in Pain? No/denies   Multiple Pain Sites No           Exercise Prescription Changes - 05/19/15 1000    Exercise Review   Progression Yes   Response to Exercise   Symptoms None   Comments Reviewed individualized exercise prescription and made increases per departmental policy. Exercise increases were discussed with the patient and they were able to perform the new work loads without issue (no signs or symptoms).    Duration Progress to 30 minutes of continuous aerobic without signs/symptoms of physical distress   Intensity Rest + 30   Progression Continue progressive overload as per policy without signs/symptoms or physical distress.   Resistance Training   Training Prescription Yes   Weight 4   Reps 10-15   Treadmill   MPH 2.8   Grade 0   Minutes 15   NuStep   Level 5   Watts 45   Minutes 15   Recumbant Elliptical   Level 4   RPM 35   Minutes 15   REL-XR   Level 3   Watts 50   Minutes 15      Goals Met:  Proper associated with RPD/PD & O2 Sat Independence with exercise equipment Exercise tolerated well Personal goals reviewed Strength training  completed today  Goals Unmet:  Not Applicable  Goals Comments: Reviewed individualized exercise prescription and made increases per departmental policy. Exercise increases were discussed with the patient and they were able to perform the new work loads without issue (no signs or symptoms).     Dr. Emily Filbert is Medical Director for [place] and [name].

## 2015-05-20 DIAGNOSIS — J9601 Acute respiratory failure with hypoxia: Secondary | ICD-10-CM | POA: Diagnosis not present

## 2015-05-21 ENCOUNTER — Encounter: Payer: Medicare Other | Attending: Internal Medicine

## 2015-05-21 DIAGNOSIS — J8410 Pulmonary fibrosis, unspecified: Secondary | ICD-10-CM

## 2015-05-21 NOTE — Progress Notes (Signed)
Daily Session Note  Patient Details  Name: Vincent Kennedy MRN: [id number] Date of Birth: [birth year] Referring Provider:  Allyne Gee, MD  Encounter Date: 05/21/2015  Check In:     Session Check In - 05/21/15 1133    Check-In   Location [facility]   Staff Present Heath Lark, RN, BSN, CCRP;Avrom Robarts, BS, ACSM EP-C, Exercise Physiologist;Laureen Owens Shark, BS, RRT, Respiratory Therapist   Supervising physician immediately available to respond to emergencies [name] immediately available ER MD   Physician(s) quale and mcshane   Medication changes reported     No   Fall or balance concerns reported    No   Warm-up and Cool-down Performed on first and last piece of equipment   Resistance Training Performed No   VAD Patient? No   Pain Assessment   Currently in Pain? 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   Dr. Emily Filbert is Medical Director for [place] and [name].

## 2015-05-23 ENCOUNTER — Encounter: Payer: Medicare Other | Admitting: *Deleted

## 2015-05-23 DIAGNOSIS — J8410 Pulmonary fibrosis, unspecified: Secondary | ICD-10-CM

## 2015-05-23 NOTE — Progress Notes (Signed)
Daily Session Note  Patient Details  Name: Vincent Kennedy MRN: [id number] Date of Birth: [birth year] Referring Provider:  Allyne Gee, MD  Encounter Date: 05/23/2015  Check In:     Session Check In - 05/23/15 1050    Check-In   Location [facility]   Staff Present Heath Lark, RN, BSN, CCRP;Stacey Blanch Media, RRT, RCP, Respiratory Therapist;Rebecca Sickles, Jerlyn Ly, RN, BSN   Supervising physician immediately available to respond to emergencies [name] immediately available ER MD   Physician(s) Dr. Archie Balboa and Dr. Reita Cliche   Medication changes reported     No   Fall or balance concerns reported    No   Warm-up and Cool-down Performed on first and last piece of equipment   Resistance Training Performed Yes   VAD Patient? No   Pain Assessment   Currently in Pain? No/denies           Exercise Prescription Changes - 05/23/15 1000    Exercise Review   Progression Yes   Response to Exercise   Symptoms None   Comments Reviewed individualized exercise prescription and made increases per departmental policy. Exercise increases were discussed with the patient and they were able to perform the new work loads without issue (no signs or symptoms).    Duration Progress to 30 minutes of continuous aerobic without signs/symptoms of physical distress   Intensity Rest + 30   Progression   Progression Continue progressive overload as per policy without signs/symptoms or physical distress.   REL-XR   Level 6   Watts 35      Goals Met:  Proper associated with RPD/PD & O2 Sat Exercise tolerated well  Goals Unmet:  Not Applicable  Comments:    Dr. Emily Filbert is Medical Director for [place] and [name].

## 2015-05-26 ENCOUNTER — Encounter: Payer: Medicare Other | Admitting: *Deleted

## 2015-05-26 DIAGNOSIS — J8410 Pulmonary fibrosis, unspecified: Secondary | ICD-10-CM

## 2015-05-26 NOTE — Progress Notes (Signed)
Daily Session Note  Patient Details  Name: Vincent Kennedy MRN: [id number] Date of Birth: 1932/09/13 Referring Provider:  Allyne Gee, MD  Encounter Date: 05/26/2015  Check In:     Session Check In - 05/26/15 1040    Check-In   Location [facility]   Staff Present Carson Myrtle, BS, RRT, Respiratory Therapist;Steven Way, BS, ACSM EP-C, Exercise Physiologist;Orrin Yurkovich Amedeo Plenty, BS, ACSM CEP, Exercise Physiologist   Supervising physician immediately available to respond to emergencies [name] immediately available ER MD   Physician(s) Dr. Clearnce Hasten and Dr. Kerman Passey   Medication changes reported     No   Fall or balance concerns reported    No   Warm-up and Cool-down Performed on first and last piece of equipment   Resistance Training Performed Yes   VAD Patient? No   Pain Assessment   Currently in Pain? No/denies   Multiple Pain Sites No         Goals Met:  Proper associated with RPD/PD & O2 Sat Independence with exercise equipment Exercise tolerated well Strength training completed today  Goals Unmet:  Not Applicable  Comments: Patient completed exercise prescription and all exercise goals during rehab session. The exercise was tolerated well and the patient is progressing in the program.     Dr. Emily Filbert is Medical Director for [place] and [name].

## 2015-05-30 ENCOUNTER — Encounter: Payer: Medicare Other | Admitting: *Deleted

## 2015-05-30 DIAGNOSIS — J8410 Pulmonary fibrosis, unspecified: Secondary | ICD-10-CM

## 2015-05-30 NOTE — Progress Notes (Signed)
Daily Session Note  Patient Details  Name: Vincent Kennedy MRN: [id number] Date of Birth: 12/20/1932 Referring Provider:  Khan, Saadat A, MD  Encounter Date: 05/30/2015  Check In:     Session Check In - 05/30/15 1026    Check-In   Location [facility]   Staff Present Carroll Enterkin, RN, BSN; , MS, ACSM CEP, Exercise Physiologist;Stacey Joyce, RRT, RCP, Respiratory Therapist;Kendall McKinney, BS, Exercise Physiologist   Supervising physician immediately available to respond to emergencies [name] immediately available ER MD   Physician(s) Gayle and Williams   Medication changes reported     No   Fall or balance concerns reported    No   Warm-up and Cool-down Performed on first and last piece of equipment   Resistance Training Performed Yes   VAD Patient? No   Pain Assessment   Currently in Pain? No/denies   Multiple Pain Sites No         Goals Met:  Proper associated with RPD/PD & O2 Sat Independence with exercise equipment Using PLB without cueing & demonstrates good technique Exercise tolerated well Strength training completed today  Goals Unmet:  Not Applicable  Comments: Patient completed exercise prescription and all exercise goals during rehab session. The exercise was tolerated well and the patient is progressing in the program.    Dr. Mark Miller is Medical Director for [facility] and [name]. 

## 2015-06-04 ENCOUNTER — Encounter: Payer: Medicare Other | Admitting: *Deleted

## 2015-06-04 DIAGNOSIS — J8410 Pulmonary fibrosis, unspecified: Secondary | ICD-10-CM

## 2015-06-04 NOTE — Progress Notes (Signed)
Daily Session Note  Patient Details  Name: Vincent Kennedy MRN: [id number] Date of Birth: Aug 15, 1932 Referring Provider:  Allyne Gee, MD  Encounter Date: 06/04/2015  Check In:     Session Check In - 06/04/15 1035    Check-In   Location [facility]   Staff Present Candiss Norse, MS, ACSM CEP, Exercise Physiologist;Steven Way, BS, ACSM EP-C, Exercise Physiologist;Laureen Owens Shark, BS, RRT, Respiratory Therapist   Supervising physician immediately available to respond to emergencies [name] immediately available ER MD   Physician(s) Archie Balboa and Marcelene Butte   Medication changes reported     No   Fall or balance concerns reported    No   Warm-up and Cool-down Performed on first and last piece of equipment   Resistance Training Performed Yes   VAD Patient? No   Pain Assessment   Currently in Pain? No/denies   Multiple Pain Sites No         Goals Met:  Proper associated with RPD/PD & O2 Sat Independence with exercise equipment Using PLB without cueing & demonstrates good technique Exercise tolerated well Personal goals reviewed Strength training completed today  Goals Unmet:  Not Applicable  Comments: Patient completed exercise prescription and all exercise goals during rehab session. The exercise was tolerated well and the patient is progressing in the program.    Dr. Emily Filbert is Medical Director for [place] and [name].

## 2015-06-06 ENCOUNTER — Encounter: Payer: Medicare Other | Admitting: *Deleted

## 2015-06-06 DIAGNOSIS — J8410 Pulmonary fibrosis, unspecified: Secondary | ICD-10-CM | POA: Diagnosis not present

## 2015-06-06 NOTE — Progress Notes (Signed)
Daily Session Note  Patient Details  Name: Vincent Kennedy MRN: [id number] Date of Birth: 1933/03/19 Referring Provider:  Casilda Carls, MD  Encounter Date: 06/06/2015  Check In:     Session Check In - 06/06/15 1049    Check-In   Location [facility]   Staff Present Heath Lark, RN, BSN, CCRP;Carroll Enterkin, RN, Drusilla Kanner, MS, ACSM CEP, Exercise Physiologist;Stacey Blanch Media, RRT, RCP, Respiratory Therapist   Supervising physician immediately available to respond to emergencies [name] immediately available ER MD   Physician(s) Corky Downs and Jimmye Norman   Medication changes reported     No   Fall or balance concerns reported    No   Warm-up and Cool-down Performed on first and last piece of equipment   Resistance Training Performed Yes   Pain Assessment   Currently in Pain? No/denies   Multiple Pain Sites No         Goals Met:  Proper associated with RPD/PD & O2 Sat Independence with exercise equipment Using PLB without cueing & demonstrates good technique Exercise tolerated well Personal goals reviewed Strength training completed today  Goals Unmet:  Not Applicable  Comments: Patient completed exercise prescription and all exercise goals during rehab session. The exercise was tolerated well and the patient is progressing in the program.    Dr. Emily Filbert is Medical Director for [place] and [name].

## 2015-06-09 ENCOUNTER — Encounter: Payer: Medicare Other | Admitting: *Deleted

## 2015-06-09 DIAGNOSIS — J8410 Pulmonary fibrosis, unspecified: Secondary | ICD-10-CM | POA: Diagnosis not present

## 2015-06-09 NOTE — Progress Notes (Signed)
Pulmonary Individual Treatment Plan  Patient Details  Name: Vincent Kennedy MRN: [id number] Date of Birth: 1932-08-09 Referring Provider:  Allyne Gee, MD  Initial Encounter Date: 04/22/15      Pulmonary Rehab from 04/22/2015 in [facility] Cardiac and Pulmonary Rehab   Date  04/22/15      Visit Diagnosis: Pulmonary fibrosis ([place])  Patient's Home Medications on Admission:  Current outpatient prescriptions:  .  albuterol (PROVENTIL HFA;VENTOLIN HFA) 108 (90 Base) MCG/ACT inhaler, Inhale 2 puffs into the lungs every 6 (six) hours as needed for wheezing or shortness of breath., Disp: 1 Inhaler, Rfl: 2 .  azithromycin (ZITHROMAX) 250 MG tablet, One tab daily for three days, Disp: 3 each, Rfl: 0 .  carvedilol (COREG) 6.25 MG tablet, Take 1 tablet (6.25 mg total) by mouth 2 (two) times daily., Disp: 60 tablet, Rfl: 0 .  cefUROXime (CEFTIN) 500 MG tablet, Take 1 tablet (500 mg total) by mouth 2 (two) times daily with a meal., Disp: 14 tablet, Rfl: 0 .  fluticasone (FLONASE) 50 MCG/ACT nasal spray, Reported on 03/12/2015, Disp: , Rfl:  .  ipratropium-albuterol (DUONEB) 0.5-2.5 (3) MG/3ML SOLN, Take 3 mLs by nebulization every 6 (six) hours., Disp: 360 mL, Rfl: 2 .  ketorolac (ACULAR) 0.4 % SOLN, 1 drop 2 (two) times daily. , Disp: , Rfl:  .  mometasone-formoterol (DULERA) 100-5 MCG/ACT AERO, Inhale 2 puffs into the lungs 2 (two) times daily., Disp: 1 Inhaler, Rfl: 1 .  pantoprazole (PROTONIX) 40 MG tablet, Take 1 tablet (40 mg total) by mouth daily., Disp: 30 tablet, Rfl: 11 .  predniSONE (DELTASONE) 20 MG tablet, Three tablets daily until tapered off by Vincent Kennedy, Disp: 90 tablet, Rfl: 0 .  rivastigmine (EXELON) 9.5 mg/24hr, APPLY ONE PATCH TO SKIN AND CHANGE DAILY, Disp: 60 patch, Rfl: 2 .  rosuvastatin (CRESTOR) 20 MG tablet, Take by mouth., Disp: , Rfl:  .  VIGAMOX 0.5 % ophthalmic solution, Place 1 drop into the left eye 4 (four) times daily. , Disp: , Rfl:  .  zolpidem (AMBIEN) 10 MG tablet,  Take 10 mg by mouth at bedtime as needed. , Disp: , Rfl:   Past Medical History: Past Medical History  Diagnosis Date  . Coronary artery disease     Cardiac cath September 2008: 20% left main stenosis, 40% mid LAD stenosis, 99% ostial first diagonal stenosis in a large branch and 90% ostial disease in second diagonal but the vessel was very small, 20% mid RCA stenosis with normal ejection fraction. Successful angioplasty and drug-eluting stent placement to the ostial first diagonal with a 2.5 x 15 mm Xience stent  . Hyperlipidemia   . Abnormal nuclear cardiac imaging test     Vincent. Fletcher Anon  . Cataract   . Sleep apnea     has not used CPAP recently.  Feels condition has improved.  . Hypertension     BP has never been high(per pt).  Metoprolol is to keep BP low because of stent (afterload)  . Shortness of breath dyspnea     with exertion  . Wears hearing aid     (sometimes)    Tobacco Use: History  Smoking status  . Never Smoker   Smokeless tobacco  . Not on file    Labs: Recent Review Flowsheet Data    Labs for ITP Cardiac and Pulmonary Rehab Latest Ref Rng 05/12/2012 07/23/2013 03/20/2015   Cholestrol 100 - 199 mg/dL 130 122 -   LDLCALC 0 - 99 mg/dL 84  72 -   HDL >39 mg/dL 34(L) 39(L) -   Trlycerides 0 - 149 mg/dL 62 55 -   PHART 7.350 - 7.450 - - 7.44   PCO2ART 32.0 - 48.0 mmHg - - 32   HCO3 21.0 - 28.0 mEq/L - - 21.7   ACIDBASEDEF 0.0 - 2.0 mmol/L - - 1.6   O2SAT - - - 93.7       ADL UCSD:     ADL UCSD      04/22/15 1300       ADL UCSD   ADL Phase Entry     SOB Score total 24     Rest 0     Walk 0     Stairs 1     Bath 0     Dress 0     Shop 2        Pulmonary Function Assessment:     Pulmonary Function Assessment - 04/22/15 1300    Pulmonary Function Tests   RV% 60 %   DLCO% 36 %   Initial Spirometry Results   FVC% 77 %   FEV1% 75 %   FEV1/FVC Ratio 75   Post Bronchodilator Spirometry Results   FVC% 77 %   FEV1% 66 %   FEV1/FVC Ratio 66    Breath   Bilateral Breath Sounds Rales;Basilar   Shortness of Breath Yes;Fear of Shortness of Breath;Limiting activity      Exercise Target Goals:    Exercise Program Goal: Individual exercise prescription set with THRR, safety & activity barriers. Participant demonstrates ability to understand and report RPE using BORG scale, to self-measure pulse accurately, and to acknowledge the importance of the exercise prescription.  Exercise Prescription Goal: Starting with aerobic activity 30 plus minutes a day, 3 days per week for initial exercise prescription. Provide home exercise prescription and guidelines that participant acknowledges understanding prior to discharge.  Activity Barriers & Risk Stratification:     Activity Barriers & Cardiac Risk Stratification - 04/22/15 1300    Activity Barriers & Cardiac Risk Stratification   Activity Barriers Shortness of Breath;Deconditioning;Muscular Weakness   Cardiac Risk Stratification Moderate      6 Minute Walk:     6 Minute Walk      04/22/15 1634 06/06/15 1053     6 Minute Walk   Phase Initial Mid Program    Distance 1460 feet 1230 feet    Distance % Change  -16 %    Walk Time 6 minutes 6 minutes    # of Rest Breaks  0    RPE 13 15    Perceived Dyspnea  2 4    Symptoms No No    Resting HR 65 bpm 67 bpm    Resting BP 126/68 mmHg 130/62 mmHg    Max Ex. HR 98 bpm 90 bpm    Max Ex. BP 146/78 mmHg 166/78 mmHg       Initial Exercise Prescription:     Initial Exercise Prescription - 04/22/15 1600    Date of Initial Exercise Prescription   Date 04/22/15   Oxygen   Oxygen Intermittent   Liters 4   Treadmill   MPH 2.4   Grade 0   Minutes 10   Recumbant Bike   Level 2   RPM 40   Watts 20   Minutes 10   NuStep   Level 2   Watts 40   Minutes 10   Arm Ergometer   Level 1   Watts 10  Minutes 10   Elliptical   Level 1   Speed 3   Minutes 1   REL-XR   Level 3   Watts 50   Minutes 10   T5 Nustep   Level 1    Watts 15   Minutes 10   Biostep-RELP   Level 2   Watts 40   Minutes 10   Prescription Details   Frequency (times per week) 3   Duration Progress to 30 minutes of continuous aerobic without signs/symptoms of physical distress   Intensity   THRR REST +  30   Ratings of Perceived Exertion 11-15   Perceived Dyspnea 2-4   Progression   Progression Continue progressive overload as per policy without signs/symptoms or physical distress.   Resistance Training   Training Prescription Yes   Weight 2   Reps 10-15      Perform Capillary Blood Glucose checks as needed.  Exercise Prescription Changes:     Exercise Prescription Changes      04/25/15 1000 04/25/15 1500 04/30/15 1100 05/05/15 1000 05/07/15 1200   Response to Exercise   Blood Pressure (Admit) 124/82 mmHg    158/62 mmHg   Blood Pressure (Exercise) 146/74 mmHg    160/80 mmHg   Blood Pressure (Exit) 124/62 mmHg    98/62 mmHg   Heart Rate (Admit) 73 bpm    73 bpm   Heart Rate (Exercise) 95 bpm    100 bpm   Heart Rate (Exit) 71 bpm    83 bpm   Oxygen Saturation (Admit) 100 %    99 %   Oxygen Saturation (Exercise) 93 %    91 %   Oxygen Saturation (Exit) 99 %    99 %   Rating of Perceived Exertion (Exercise) 11    12   Perceived Dyspnea (Exercise) 2    3   Symptoms None   none Nonw   Comments First day of exercise! Patient was oriented to the gym and the equipment functions and settings. Procedures and policies of the gym were outlined and explained. The patient's individual exercise prescription and treatment plan were reviewed with them. All starting workloads were established based on the results of the functional testing  done at the initial intake visit. The plan for exercise progression was also introduced and progression will be customized based on the patient's performance and goals.    Increases in exercise prescription were reviewed with the patient. He tolerated the increase with no signs and symptoms.  Vincent Kennedy is  progressing well in the program and can consistently exercise for the enite 45 minutes of aerobic exericse. We will focus progression efforts on intensity as the patient is able to tolerate them.    Duration Progress to 30 minutes of continuous aerobic without signs/symptoms of physical distress   Progress to 30 minutes of continuous aerobic without signs/symptoms of physical distress Progress to 30 minutes of continuous aerobic without signs/symptoms of physical distress   Intensity Rest + 30   Rest + 30 Rest + 30   Progression   Progression Continue progressive overload as per policy without signs/symptoms or physical distress.   Continue progressive overload as per policy without signs/symptoms or physical distress. Continue progressive overload as per policy without signs/symptoms or physical distress.   Resistance Training   Training Prescription (read-only) Yes  Yes Yes Yes   Weight (read-only) '2  4 4 4   '$ Reps (read-only) 10-15  10-15 10-15 10-15   Treadmill   MPH (  read-only) 2.4 2.8 2.8 2.8 2.8   Grade (read-only) 0 0 0 0 0   Minutes (read-only) '10 10 15 15 15   '$ NuStep   Level (read-only) '2 3 3 3 3   '$ Watts (read-only) 40 40 40 40 40   Minutes (read-only) '10 10 15 15 15   '$ Recumbant Elliptical   Level (read-only) '3   4 4   '$ RPM (read-only)    35 35   Watts (read-only) 50       Minutes (read-only) '10   15 15     '$ 05/12/15 1000 05/19/15 1000 05/23/15 1000 05/26/15 0646     Exercise Review   Progression Yes Yes Yes     Response to Exercise   Blood Pressure (Admit)    124/68 mmHg    Blood Pressure (Exercise)    138/60 mmHg    Blood Pressure (Exit)    122/62 mmHg    Heart Rate (Admit)    68 bpm    Heart Rate (Exercise)    97 bpm    Heart Rate (Exit)    74 bpm    Oxygen Saturation (Admit)    97 %    Oxygen Saturation (Exercise)    96 %    Oxygen Saturation (Exit)    99 %    Rating of Perceived Exertion (Exercise)    13    Perceived Dyspnea (Exercise)    3    Symptoms None None None  None    Comments Reviewed individualized exercise prescription and made increases per departmental policy. Exercise increases were discussed with the patient and they were able to perform the new work loads without issue (no signs or symptoms).  Reviewed individualized exercise prescription and made increases per departmental policy. Exercise increases were discussed with the patient and they were able to perform the new work loads without issue (no signs or symptoms).  Reviewed individualized exercise prescription and made increases per departmental policy. Exercise increases were discussed with the patient and they were able to perform the new work loads without issue (no signs or symptoms).      Duration Progress to 30 minutes of continuous aerobic without signs/symptoms of physical distress Progress to 30 minutes of continuous aerobic without signs/symptoms of physical distress Progress to 30 minutes of continuous aerobic without signs/symptoms of physical distress Progress to 30 minutes of continuous aerobic without signs/symptoms of physical distress    Intensity Rest + 30 Rest + 30 Rest + 30 Rest + 30    Progression   Progression Continue progressive overload as per policy without signs/symptoms or physical distress. Continue progressive overload as per policy without signs/symptoms or physical distress. Continue progressive overload as per policy without signs/symptoms or physical distress. Continue progressive overload as per policy without signs/symptoms or physical distress.    Resistance Training   Training Prescription (read-only) Yes Yes      Weight (read-only) 4 4      Reps (read-only) 10-15 10-15      Treadmill   MPH (read-only) 2.8 2.8      Grade (read-only) 0 0      Minutes (read-only) 15 15      NuStep   Level (read-only) 4 5      Watts (read-only) 40 45      Minutes (read-only) 15 15      Recumbant Elliptical   Level (read-only) 4 4      RPM (read-only) 35 35      Minutes  (read-only)  15 15      REL-XR   Level   6 6    Watts   35 35    Level (read-only) 3 3      Watts (read-only) 50 50      Minutes (read-only) 15 15         Exercise Comments:     Exercise Comments      06/06/15 1052           Exercise Comments Decreased on Mid 6MW test from intake distance. Patient insisted doing the walk at 2L/C of O2 to match what he did at the beginning, but he was advised to use 3-6L/C of O2. This may have led to a decrease in distance          Discharge Exercise Prescription (Final Exercise Prescription Changes):     Exercise Prescription Changes - 05/26/15 0646    Response to Exercise   Blood Pressure (Admit) 124/68 mmHg   Blood Pressure (Exercise) 138/60 mmHg   Blood Pressure (Exit) 122/62 mmHg   Heart Rate (Admit) 68 bpm   Heart Rate (Exercise) 97 bpm   Heart Rate (Exit) 74 bpm   Oxygen Saturation (Admit) 97 %   Oxygen Saturation (Exercise) 96 %   Oxygen Saturation (Exit) 99 %   Rating of Perceived Exertion (Exercise) 13   Perceived Dyspnea (Exercise) 3   Symptoms None   Duration Progress to 30 minutes of continuous aerobic without signs/symptoms of physical distress   Intensity Rest + 30   Progression   Progression Continue progressive overload as per policy without signs/symptoms or physical distress.   REL-XR   Level 6   Watts 35       Nutrition:  Target Goals: Understanding of nutrition guidelines, daily intake of sodium '1500mg'$ , cholesterol '200mg'$ , calories 30% from fat and 7% or less from saturated fats, daily to have 5 or more servings of fruits and vegetables.  Biometrics:     Pre Biometrics - 04/22/15 1637    Pre Biometrics   Height '5\' 7"'$  (1.702 Kennedy)   Weight 164 lb 8 oz (74.617 kg)   Waist Circumference 38 inches   Hip Circumference 39 inches   Waist to Hip Ratio 0.97 %   BMI (Calculated) 25.8       Nutrition Therapy Plan and Nutrition Goals:     Nutrition Therapy & Goals - 04/22/15 1300    Nutrition Therapy    Diet Vincent Kennedy would like to meet with the dietitian. He has recently lost about 10lbs by cutting his portion amounts. He does have a garden and eats a lot of vegetables. He drinks a mixture of juice and water and drink  8-9 glasses a day.      Nutrition Discharge: Rate Your Plate Scores:   Psychosocial: Target Goals: Acknowledge presence or absence of depression, maximize coping skills, provide positive support system. Participant is able to verbalize types and ability to use techniques and skills needed for reducing stress and depression.  Initial Review & Psychosocial Screening:     Initial Psych Review & Screening - 04/22/15 1300    Family Dynamics   Good Support System? Yes   Comments Vincent Kennedy has good support from his wife and 6 children. He is concerned with this new diagnosis of Pulmonary Fibrosis and will know more when he attends his [facility] appointment with the pulmonologist who specializes in Interstitual diseases.   Barriers   Psychosocial barriers to participate in program The patient should benefit  from training in stress management and relaxation.   Screening Interventions   Interventions Encouraged to exercise;Program counselor consult      Quality of Life Scores:     Quality of Life - 04/22/15 1300    Quality of Life Scores   Health/Function Pre 17.38 %   Socioeconomic Pre 23.57 %   Psych/Spiritual Pre 22.5 %   Family Pre 24.4 %   GLOBAL Pre 20.64 %      PHQ-9:     Recent Review Flowsheet Data    Depression screen [facility] 2/9 04/22/2015 10/16/2014   Decreased Interest 0 0   Down, Depressed, Hopeless 0 0   PHQ - 2 Score 0 0   Altered sleeping 0 -   Change in appetite 0 -   Feeling bad or failure about yourself  0 -   Trouble concentrating 0 -   Moving slowly or fidgety/restless 3 -   Suicidal thoughts 0 -   PHQ-9 Score 3 -   Difficult doing work/chores Somewhat difficult -      Psychosocial Evaluation and Intervention:     Psychosocial Evaluation -  05/05/15 1116    Psychosocial Evaluation & Interventions   Interventions Encouraged to exercise with the program and follow exercise prescription   Comments Counselor met with Vincent Kennedy today for initial psychosocial evaluation.  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  He has a strong support system with a spouse of 69 years and close friends.   He is also actively involved in his local faith community.  Vincent Kennedy reports that he sleeps well typically and has a good appetite.  He is generally in a positive mood and denies a history or current symptoms of depression or anxiety.  Vincent Kennedy states that his health is his primary stressor currently.  He has goals to get off of his Oxygen as much and increase his stamina and strength while in this program.  He plans to purchase some home equipment to help exercise more consistently.  Counselor will continue to follow with Vincent Kennedy.    Continued Psychosocial Services Needed Yes  Consistent exercise is recommended and Vincent Kennedy will benefit from the psychoeducational components of this program.        Psychosocial Re-Evaluation:  Education: Education Goals: Education classes will be provided on a weekly basis, covering required topics. Participant will state understanding/return demonstration of topics presented.  Learning Barriers/Preferences:     Learning Barriers/Preferences - 04/22/15 1300    Learning Barriers/Preferences   Learning Barriers None   Learning Preferences Group Instruction;Individual Instruction;Pictoral;Skilled Demonstration;Verbal Instruction;Written Material      Education Topics: Initial Evaluation Education: - Verbal, written and demonstration of respiratory meds, RPE/PD scales, oximetry and breathing techniques. Instruction on use of nebulizers and MDIs: cleaning and proper use, rinsing mouth with steroid doses and importance of monitoring MDI activations.           Pulmonary Rehab from 05/30/2015 in [facility] Cardiac and Pulmonary Rehab   Date  04/22/15   Educator  LB   Instruction Review Code  2- meets goals/outcomes      General Nutrition Guidelines/Fats and Fiber: -Group instruction provided by verbal, written material, models and posters to present the general guidelines for heart healthy nutrition. Gives an explanation and review of dietary fats and fiber.      Pulmonary Rehab from 05/30/2015 in [facility] Cardiac and Pulmonary Rehab   Date  04/28/15  Educator  CE   Instruction Review Code  2- meets goals/outcomes      Controlling Sodium/Reading Food Labels: -Group verbal and written material supporting the discussion of sodium use in heart healthy nutrition. Review and explanation with models, verbal and written materials for utilization of the food label.      Pulmonary Rehab from 05/30/2015 in [facility] Cardiac and Pulmonary Rehab   Date  05/12/15   Educator  CR   Instruction Review Code  2- meets goals/outcomes      Exercise Physiology & Risk Factors: - Group verbal and written instruction with models to review the exercise physiology of the cardiovascular system and associated critical values. Details cardiovascular disease risk factors and the goals associated with each risk factor.   Aerobic Exercise & Resistance Training: - Gives group verbal and written discussion on the health impact of inactivity. On the components of aerobic and resistive training programs and the benefits of this training and how to safely progress through these programs.   Flexibility, Balance, General Exercise Guidelines: - Provides group verbal and written instruction on the benefits of flexibility and balance training programs. Provides general exercise guidelines with specific guidelines to those with heart or lung disease. Demonstration and skill practice provided.      Pulmonary Rehab from 05/30/2015 in [facility] Cardiac and Pulmonary Rehab   Date  05/07/15    Educator  RM   Instruction Review Code  2- meets goals/outcomes      Stress Management: - Provides group verbal and written instruction about the health risks of elevated stress, cause of high stress, and healthy ways to reduce stress.      Pulmonary Rehab from 05/30/2015 in [facility] Cardiac and Pulmonary Rehab   Date  05/14/15   Educator  [facility]   Instruction Review Code  2- meets goals/outcomes      Depression: - Provides group verbal and written instruction on the correlation between heart/lung disease and depressed mood, treatment options, and the stigmas associated with seeking treatment.   Exercise & Equipment Safety: - Individual verbal instruction and demonstration of equipment use and safety with use of the equipment.      Pulmonary Rehab from 05/30/2015 in [facility] Cardiac and Pulmonary Rehab   Date  04/25/15   Educator  SJ   Instruction Review Code  2- meets goals/outcomes      Infection Prevention: - Provides verbal and written material to individual with discussion of infection control including proper hand washing and proper equipment cleaning during exercise session.      Pulmonary Rehab from 05/30/2015 in [facility] Cardiac and Pulmonary Rehab   Date  04/25/15   Educator  SJ   Instruction Review Code  2- meets goals/outcomes      Falls Prevention: - Provides verbal and written material to individual with discussion of falls prevention and safety.      Pulmonary Rehab from 05/30/2015 in [facility] Cardiac and Pulmonary Rehab   Date  04/22/15   Educator  LB   Instruction Review Code  2- meets goals/outcomes      Diabetes: - Individual verbal and written instruction to review signs/symptoms of diabetes, desired ranges of glucose level fasting, after meals and with exercise. Advice that pre and post exercise glucose checks will be done for 3 sessions at entry of program.   Chronic Lung Diseases: - Group verbal and written instruction to review new updates, new respiratory  medications, new advancements in procedures and treatments. Provide informative websites and "800" numbers of  self-education.      Pulmonary Rehab from 05/30/2015 in [facility] Cardiac and Pulmonary Rehab   Date  05/05/15   Educator  LB   Instruction Review Code  2- meets goals/outcomes      Lung Procedures: - Group verbal and written instruction to describe testing methods done to diagnose lung disease. Review the outcome of test results. Describe the treatment choices: Pulmonary Function Tests, ABGs and oximetry.      Pulmonary Rehab from 05/30/2015 in [facility] Cardiac and Pulmonary Rehab   Date  05/02/15   Educator  SJ   Instruction Review Code  2- meets goals/outcomes      Energy Conservation: - Provide group verbal and written instruction for methods to conserve energy, plan and organize activities. Instruct on pacing techniques, use of adaptive equipment and posture/positioning to relieve shortness of breath.   Triggers: - Group verbal and written instruction to review types of environmental controls: home humidity, furnaces, filters, dust mite/pet prevention, HEPA vacuums. To discuss weather changes, air quality and the benefits of nasal washing.   Exacerbations: - Group verbal and written instruction to provide: warning signs, infection symptoms, calling MD promptly, preventive modes, and value of vaccinations. Review: effective airway clearance, coughing and/or vibration techniques. Create an Sport and exercise psychologist.   Oxygen: - Individual and group verbal and written instruction on oxygen therapy. Includes supplement oxygen, available portable oxygen systems, continuous and intermittent flow rates, oxygen safety, concentrators, and Medicare reimbursement for oxygen.      Pulmonary Rehab from 05/30/2015 in [facility] Cardiac and Pulmonary Rehab   Date  04/22/15   Educator  LB   Instruction Review Code  2- meets goals/outcomes      Respiratory Medications: - Group verbal and written instruction to  review medications for lung disease. Drug class, frequency, complications, importance of spacers, rinsing mouth after steroid MDI's, and proper cleaning methods for nebulizers.      Pulmonary Rehab from 05/30/2015 in [facility] Cardiac and Pulmonary Rehab   Date  04/22/15   Educator  LB   Instruction Review Code  2- meets goals/outcomes      AED/CPR: - Group verbal and written instruction with the use of models to demonstrate the basic use of the AED with the basic ABC's of resuscitation.      Pulmonary Rehab from 05/30/2015 in [facility] Cardiac and Pulmonary Rehab   Date  05/23/15   Educator  CE   Instruction Review Code  2- meets goals/outcomes      Breathing Retraining: - Provides individuals verbal and written instruction on purpose, frequency, and proper technique of diaphragmatic breathing and pursed-lipped breathing. Applies individual practice skills.      Pulmonary Rehab from 05/30/2015 in [facility] Cardiac and Pulmonary Rehab   Date  04/22/15   Educator  SJ   Instruction Review Code  2- meets goals/outcomes      Anatomy and Physiology of the Lungs: - Group verbal and written instruction with the use of models to provide basic lung anatomy and physiology related to function, structure and complications of lung disease.   Heart Failure: - Group verbal and written instruction on the basics of heart failure: signs/symptoms, treatments, explanation of ejection fraction, enlarged heart and cardiomyopathy.   Sleep Apnea: - Individual verbal and written instruction to review Obstructive Sleep Apnea. Review of risk factors, methods for diagnosing and types of masks and machines for OSA.      Pulmonary Rehab from 05/30/2015 in [facility] Cardiac and Pulmonary Rehab   Date  04/22/15  Educator  LB   Instruction Review Code  2- meets goals/outcomes      Anxiety: - Provides group, verbal and written instruction on the correlation between heart/lung disease and anxiety, treatment options, and  management of anxiety.   Relaxation: - Provides group, verbal and written instruction about the benefits of relaxation for patients with heart/lung disease. Also provides patients with examples of relaxation techniques.   Knowledge Questionnaire Score:     Knowledge Questionnaire Score - 04/22/15 1300    Knowledge Questionnaire Score   Pre Score 0/10       Personal Goals and Risk Factors at Admission:     Personal Goals and Risk Factors at Admission - 04/22/15 1300    Core Components/Risk Factors/Patient Goals on Admission    Weight Management Yes   Intervention (read-only) Learn and follow the exercise and diet guidelines while in the program. Utilize the nutrition and education classes to help gain knowledge of the diet and exercise expectations in the program  Vincent Kennedy does want to meet with the dietitian. He eats a lot of vegetables and has lowered his portion amounts. He drinks 8-9 glasses of water and juice a day.   Admit Weight 164 lb 8 oz (74.617 kg)   Goal Weight: Short Term 160 lb (72.576 kg)   Sedentary Yes   Intervention (read-only) While in program, learn and follow the exercise prescription taught. Start at a low level workload and increase workload after able to maintain previous level for 30 minutes. Increase time before increasing intensity.  Vincent Kennedy is very active with yardwork and gardening and wants to continue exercising. He is considering a TM or EL for home use.   Improve shortness of breath with ADL's Yes  shortness of breath. He will benefit with monitoring his O2Sats and pacing.   Intervention (read-only) --  Vincent Kennedy has modified his activity with his increased shortness of breath. Family and friends noticed it, but Vincent Kennedy  did not  address the issue. Now that he has been diagonised with Pulmonary fibrosis, he can look back and see his increased    Develop more efficient breathing techniques such as purse lipped breathing and diaphragmatic breathing;  and practicing self-pacing with activity Yes   Intervention (read-only) While in program, learn and utilize the specific breathing techniques taught to you. Continue to practice and use the techniques as needed.  Instructed Vincent Kennedy on PLB and its benefit with shortness of breath with activity.   Increase knowledge of respiratory medications and ability to use respiratory devices properly  Yes   Intervention (read-only) While in program learn and demonstrate appropriate use of your oxygen therapy by increasing flow with exertion, manage oxygen tank operation, including continuous and intermittent flow.  Understanding oxygen is a drug ordered by your physician.;While in program, learn to administer MDI, nebulizer, and spacer properly.;Learn to take respiratory medicine as ordered.;While in program, learn to Clean MDI, nebulizers, and spacers properly.  Vincent Kennedy uses [facility] and a SVN with Albuterol .  He has oxygen: 2l/Kennedy rest and 3-4l/Kennedy activity. 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 Vincent Kennedy wears a full face mask with his CPAP.   Understand more about Heart/Pulmonary Disease. Yes   Intervention While in program utilize professionals for any questions, and attend the education sessions. Great websites to use are www.americanheart.org or www.lung.org for reliable information.  Vincent Kennedy was recently diagnosed with Pulmonary Fibrosis. He has been referred to [facility]  to see a specialist and is very interested in learning more about his disease.      Personal Goals and Risk Factors Review:      Goals and Risk Factor Review      04/22/15 1300 04/25/15 1507 04/28/15 1000 04/28/15 1056 05/02/15 1415   Core Components/Risk Factors/Patient Goals Review   Personal Goals Review Improve shortness of breath with ADL's Develop more efficient breathing techniques such as purse lipped breathing and diaphragmatic breathing and practicing self-pacing with activity.  Understand more about Heart/Pulmonary Disease Increase Aerobic Exercise and Physical Activity    Increase Aerobic Exercise and Physical Activity (read-only)   Comments    Vincent Kennedy is on his second day of exercise in Pulmonary Rehab. We discussed his goals of wanting to be able to get back to doing yard work, gardening, and walking which are activities he enjoys. He stated that there is an incline on his driveway and several steps that he needs to climb to do these activities. We discussed how his exerciser routine can hopefully allow him to be able to do more without getting as short of breath, and eventurally get back to doing these ADL's.    Understand more about Heart/Pulmonary Disease (read-only)   Goals Progress/Improvement seen    Yes     Comments   Gave Vincent Kennedy a copy of the Pulmonary Fibrosis Guide for Patients from the PF Association which has great educational material.     Improve shortness of breath with ADL's (read-only)   Goals Progress/Improvement seen      Yes   Comments     Vincent Kennedy says that he notices less shorness of breath when walking down his driveway.  He wants to try it without his O2 but he was advised against it at this time.   Breathing Techniques (read-only)   Goals Progress/Improvement seen   Yes Yes  Yes   Comments  Taught PLB today. Encouraged Vincent Kennedy to use PLB on the treadmill; it is helpful to him with his shortness of breath       05/19/15 1542 05/27/15 0852 06/04/15 1036 06/04/15 1459     Core Components/Risk Factors/Patient Goals Review   Personal Goals Review Improve shortness of breath with ADL's Sedentary Other Improve shortness of breath with ADL's;Develop more efficient breathing techniques such as purse lipped breathing and diaphragmatic breathing and practicing self-pacing with activity.    Review Vincent Rogue Kennedy had increased shortness of breath on the treadmill today. He had a big breakfast at 9:00am and states that could have increased his shortness of  breath and lowered his O2Sat's.  Vincent Kennedy is learning to adjust his oxygen, but does have to be reminded at times.  Increasing activity Vincent. Emilio Kennedy has missed a few sessions and was discouraged about his weight and O2 saturation. He is determined to not lose his conditioning and the progress he has made. Vincent Kennedy was given information on the IPF Support Group which meets tomorrow evening. I gave him a copy of the IPF Foundation publication. Vincent Kennedy starts on [place] tomorrow and discussed the benefits of slowing the progression of his IPF and hepl with his shortness of breath. He is using PLB and is adjusting his oxygen flow rates with activity.    Expected Outcomes Independence with adjusting oxygen with increased activity. Vincent. Emilio Kennedy is remaining active outside of class, but the current exercise regimen is helping him strengthen.  He should be able to progress on all equipment within next two  weeks pending regular attendance. Vincent. Clydie Kennedy will resume his previous workloads but with regular attendance and with him doing his home exercises he is projected to make exercise increases and progression by early next week.  Be able to tolerate Esbriet and see improvement in his lung function; continue adjusting oxygen with activity with exercise and activites at home.       Personal Goals Discharge (Final Personal Goals and Risk Factors Review):      Goals and Risk Factor Review - 06/04/15 1459    Core Components/Risk Factors/Patient Goals Review   Personal Goals Review Improve shortness of breath with ADL's;Develop more efficient breathing techniques such as purse lipped breathing and diaphragmatic breathing and practicing self-pacing with activity.   Review Vincent Kennedy was given information on the IPF Support Group which meets tomorrow evening. I gave him a copy of the IPF Foundation publication. Vincent Kennedy starts on Esbriet tomorrow and discussed the benefits of slowing the progression of his IPF and hepl  with his shortness of breath. He is using PLB and is adjusting his oxygen flow rates with activity.   Expected Outcomes Be able to tolerate Esbriet and see improvement in his lung function; continue adjusting oxygen with activity with exercise and activites at home.      ITP Comments:     ITP Comments      04/25/15 1020 04/25/15 1514 04/28/15 1019 04/30/15 1114 05/02/15 1037   ITP Comments Personal and exercise goals expected to be met in 34 more sessions. Progress on specific individualized goals will be charted in patient's ITP. Upon completion of the program the patient will be comfortable managing exercise goals and progression on their own.  Spoke with Vincent Kennedy today about setting goals and working on the goals while in the program. Will review nutriton goals  and set short term and long term goals after he has met with the RD.  Personal and exercise goals expected to be met in 33 more sessions. Progress on specific individualized goals will be charted in patient's ITP. Upon completion of the program the patient will be comfortable managing exercise goals and progression on their own.  Personal and exercise goals expected to be met in 32 more sessions. Progress on specific individualized goals will be charted in patient's ITP. Upon completion of the program the patient will be comfortable managing exercise goals and progression on their own.  Personal and exercise goals expected to be met in 31 more sessions. Progress on specific individualized goals will be charted in patient's ITP. Upon completion of the program the patient will be comfortable managing exercise goals and progression on their own.      05/02/15 1539 05/05/15 1033 05/07/15 1058 05/09/15 1054 05/12/15 1027   ITP Comments Vincent. Trim will meet with his Pulmonologist at [facility] for the first time on February 21st. Exercise and personal goals anticipated to be met in 30 more sessions. See ITP for detailed report on goals.  Exercise and  personal goals anticipated to be met in 29 more sessions. See ITP for detailed report on goals.  Personal and exercise goals expected to be met in 28 more sessions. Progress on specific individualized goals will be charted in patient's ITP. Upon completion of the program the patient will be comfortable managing exercise goals and progression on their own.  Personal and exercise goals expected to be met in 27 more sessions. Progress on specific individualized goals will be charted in patient's ITP. Upon completion of the program the patient  will be comfortable managing exercise goals and progression on their own.      05/14/15 1055 05/16/15 1043 05/19/15 1051 05/23/15 1048 05/30/15 1028   ITP Comments Personal and exercise goals expected to be met in 26 more sessions. Progress on specific individualized goals will be charted in patient's ITP. Upon completion of the program the patient will be comfortable managing exercise goals and progression on their own.  Personal and exercise goals expected to be met in 25 more sessions. Progress on specific individualized goals will be charted in patient's ITP. Upon completion of the program the patient will be comfortable managing exercise goals and progression on their own.  Personal and exercise goals expected to be met in 24 more sessiosns. See ITP for specific notes no goal progression. Personal and exercise goals expected to be met in 23 more sessions. See ITP for specific notes on goal progression.  Personal and exercise goals expected to be met in 20 more sessions. Progress on specific individualized goals will be charted in patient's ITP. Upon completion of the program the patient will be comfortable managing exercise goals and progression on their own.       Comments: 30 Day Review

## 2015-06-09 NOTE — Progress Notes (Signed)
Daily Session Note  Patient Details  Name: Vincent Kennedy MRN: [id number] Date of Birth: 02/08/1933 Referring Provider:  Allyne Gee, MD  Encounter Date: 06/09/2015  Check In:     Session Check In - 06/09/15 1053    Check-In   Location [facility]-Cardiac & Pulmonary Rehab   Staff Present Carson Myrtle, BS, RRT, Respiratory Therapist;Steven Way, BS, ACSM EP-C, Exercise Physiologist;[place] Stell Amedeo Plenty, BS, ACSM CEP, Exercise Physiologist   Supervising physician immediately available to respond to emergencies [name] immediately available ER MD   Physician(s) Owens Shark and Reita Cliche   Medication changes reported     No   Fall or balance concerns reported    No   Warm-up and Cool-down Performed on first and last piece of equipment   Resistance Training Performed Yes   VAD Patient? No   Pain Assessment   Currently in Pain? No/denies   Multiple Pain Sites No         Goals Met:  Proper associated with RPD/PD & O2 Sat Independence with exercise equipment Exercise tolerated well Strength training completed today  Goals Unmet:  Not Applicable  Comments: Patient completed exercise prescription and all exercise goals during rehab session. The exercise was tolerated well and the patient is progressing in the program.     Dr. Emily Filbert is Medical Director for [place] and [name].

## 2015-06-11 ENCOUNTER — Encounter: Payer: Medicare Other | Admitting: *Deleted

## 2015-06-11 DIAGNOSIS — J8410 Pulmonary fibrosis, unspecified: Secondary | ICD-10-CM

## 2015-06-11 NOTE — Progress Notes (Signed)
Daily Session Note  Patient Details  Name: Vincent Kennedy MRN: [id number] Date of Birth: 04/04/1932 Referring Provider:  Khan, Saadat A, MD  Encounter Date: 06/11/2015  Check In:     Session Check In - 06/11/15 1032    Check-In   Location [facility]-Cardiac & Pulmonary Rehab   Staff Present  , MS, ACSM CEP, Exercise Physiologist;Steven Way, BS, ACSM EP-C, Exercise Physiologist;Laureen Brown, BS, RRT, Respiratory Therapist   Supervising physician immediately available to respond to emergencies [name] immediately available ER MD   Physician(s) Williams and Yao   Medication changes reported     No   Fall or balance concerns reported    No   Warm-up and Cool-down Performed on first and last piece of equipment   Resistance Training Performed Yes   VAD Patient? No   Pain Assessment   Currently in Pain? No/denies         Goals Met:  Proper associated with RPD/PD & O2 Sat Independence with exercise equipment Improved SOB with ADL's Exercise tolerated well Strength training completed today  Goals Unmet:  Not Applicable  Comments: Patient completed exercise prescription and all exercise goals during rehab session. The exercise was tolerated well and the patient is progressing in the program.    Dr. Mark Miller is Medical Director for [facility] and [name]. 

## 2015-06-16 ENCOUNTER — Encounter: Payer: Medicare Other | Admitting: *Deleted

## 2015-06-16 DIAGNOSIS — J8410 Pulmonary fibrosis, unspecified: Secondary | ICD-10-CM | POA: Diagnosis not present

## 2015-06-16 NOTE — Progress Notes (Signed)
Daily Session Note  Patient Details  Name: Vincent Kennedy MRN: [id number] Date of Birth: 18-Nov-1932 Referring Provider:  Allyne Gee, MD  Encounter Date: 06/16/2015  Check In:     Session Check In - 06/16/15 1030    Check-In   Location [facility]-Cardiac & Pulmonary Rehab   Staff Present Lestine Box, BS, ACSM EP-C, Exercise Physiologist;Laureen Owens Shark, BS, RRT, Respiratory Bertis Ruddy, BS, ACSM CEP, Exercise Physiologist;Jamar Casagrande Dillard Essex, MS, ACSM CEP, Exercise Physiologist   Supervising physician immediately available to respond to emergencies [name] immediately available ER MD   Physician(s) Joni Fears and Jimmye Norman   Medication changes reported     No   Fall or balance concerns reported    No   Warm-up and Cool-down Performed on first and last piece of equipment   Resistance Training Performed Yes   VAD Patient? No   Pain Assessment   Currently in Pain? No/denies   Multiple Pain Sites No         Goals Met:  Proper associated with RPD/PD & O2 Sat Independence with exercise equipment Using PLB without cueing & demonstrates good technique Exercise tolerated well Strength training completed today  Goals Unmet:  Not Applicable  Comments: Patient completed exercise prescription and all exercise goals during rehab session. The exercise was tolerated well and the patient is progressing in the program.    Dr. Emily Filbert is Medical Director for [place] and [name].

## 2015-06-23 ENCOUNTER — Encounter: Payer: Medicare Other | Attending: Internal Medicine

## 2015-06-23 DIAGNOSIS — J8410 Pulmonary fibrosis, unspecified: Secondary | ICD-10-CM | POA: Insufficient documentation

## 2015-07-02 ENCOUNTER — Encounter: Payer: Medicare Other | Admitting: *Deleted

## 2015-07-02 DIAGNOSIS — J8410 Pulmonary fibrosis, unspecified: Secondary | ICD-10-CM | POA: Diagnosis not present

## 2015-07-02 NOTE — Progress Notes (Signed)
Daily Session Note  Patient Details  Name: Muhammad A Chesmore MRN: [id number] Date of Birth: 11/14/1932 Referring Provider:  Khan, Saadat A, MD  Encounter Date: 07/02/2015  Check In:     Session Check In - 07/02/15 1121    Check-In   Staff Present Susanne Bice, RN, BSN, CCRP;Mary Jo Abernethy, RN;[place] Enterkin, RN, BSN   Supervising physician immediately available to respond to emergencies [name] immediately available ER MD   Physician(s) Drs: Gayle and McShane   Medication changes reported     No   Fall or balance concerns reported    No   Warm-up and Cool-down Performed on first and last piece of equipment   VAD Patient? No   Pain Assessment   Currently in Pain? No/denies         Goals Met:  Independence with exercise equipment Exercise tolerated well  Goals Unmet:  Not Applicable  Comments: Doing well with exercise prescription progression.    Dr. Mark Miller is Medical Director for [facility] and [name]. 

## 2015-07-04 ENCOUNTER — Encounter: Payer: Medicare Other | Admitting: *Deleted

## 2015-07-04 DIAGNOSIS — J8410 Pulmonary fibrosis, unspecified: Secondary | ICD-10-CM | POA: Diagnosis not present

## 2015-07-04 NOTE — Progress Notes (Signed)
Daily Session Note  Patient Details  Name: [name] MRN: [id number] Date of Birth: January 07, 1933 Referring Provider:    Encounter Date: 07/04/2015  Check In:     Session Check In - 07/04/15 1136    Check-In   Staff Present Nyoka Cowden, RN;[place] Nordquist, RN, BSN, CCRP;[place] Enterkin, RN, BSN   Supervising physician immediately available to respond to emergencies [name] immediately available ER MD   Physician(s) Drs: Joni Fears and Jacqualine Code   Medication changes reported     No   Fall or balance concerns reported    No   Warm-up and Cool-down Performed on first and last piece of equipment   VAD Patient? No   Pain Assessment   Currently in Pain? No/denies         Goals Met:  Independence with exercise equipment Exercise tolerated well  Goals Unmet:  Not Applicable  Comments: Doing well with exercise prescription progression. .   Dr. Emily Filbert is Medical Director for [place] and [name].

## 2015-07-07 ENCOUNTER — Encounter: Payer: Medicare Other | Admitting: *Deleted

## 2015-07-07 DIAGNOSIS — J8410 Pulmonary fibrosis, unspecified: Secondary | ICD-10-CM

## 2015-07-07 NOTE — Progress Notes (Signed)
Daily Session Note  Patient Details  Name: Vincent Kennedy MRN: [id number] Date of Birth: 28-May-1932 Referring Provider:    Encounter Date: 07/07/2015  Check In:     Session Check In - 07/07/15 1112    Check-In   Location [facility]-Cardiac & Pulmonary Rehab   Staff Present Carson Myrtle, BS, RRT, Respiratory Therapist;[place] Enterkin, RN, Moises Blood, BS, ACSM CEP, Exercise Physiologist   Supervising physician immediately available to respond to emergencies [name] immediately available ER MD   Physician(s) Edd Fabian and Corky Downs   Medication changes reported     No   Fall or balance concerns reported    No   Warm-up and Cool-down Performed on first and last piece of equipment   Resistance Training Performed Yes   VAD Patient? No   Pain Assessment   Currently in Pain? No/denies   Multiple Pain Sites No           Exercise Prescription Changes - 07/07/15 1100    Exercise Review   Progression Yes   Response to Exercise   Blood Pressure (Admit) 128/64 mmHg   Blood Pressure (Exercise) 158/60 mmHg   Heart Rate (Admit) 74 bpm   Heart Rate (Exercise) 94 bpm   Oxygen Saturation (Admit) 93 %   Oxygen Saturation (Exercise) 93 %   Rating of Perceived Exertion (Exercise) 13   Perceived Dyspnea (Exercise) 3   Symptoms None   Duration Progress to 30 minutes of continuous aerobic without signs/symptoms of physical distress   Intensity Rest + 30   Progression   Progression Continue progressive overload as per policy without signs/symptoms or physical distress.   Resistance Training   Training Prescription Yes   Weight 4   Reps 10-15   Treadmill   MPH 2.6   Grade 0   Minutes 15   NuStep   Level 6   Watts 50   Minutes 15   Recumbant Elliptical   Level 6   Watts 35   Minutes 15   REL-XR   Level --   Watts --      Goals Met:  Proper associated with RPD/PD & O2 Sat Independence with exercise equipment Exercise tolerated well Personal goals reviewed  Goals Unmet:   Not Applicable  Comments: Patient completed exercise prescription and all exercise goals during rehab session. The exercise was tolerated well and the patient is progressing in the program.     Dr. Emily Filbert is Medical Director for [place] and [name].

## 2015-07-07 NOTE — Progress Notes (Signed)
Pulmonary Individual Treatment Plan  Patient Details  Name: Vincent Kennedy MRN: [id number] Date of Birth: 07/14/1932 Referring Provider:  Dr. Devona Konig  Initial Encounter Date:       Pulmonary Rehab from 04/22/2015 in [facility] Cardiac and Pulmonary Rehab   Date  04/22/15      Visit Diagnosis: Pulmonary fibrosis ([place])  Patient's Home Medications on Admission:  Current outpatient prescriptions:  .  albuterol (PROVENTIL HFA;VENTOLIN HFA) 108 (90 Base) MCG/ACT inhaler, Inhale 2 puffs into the lungs every 6 (six) hours as needed for wheezing or shortness of breath., Disp: 1 Inhaler, Rfl: 2 .  azithromycin (ZITHROMAX) 250 MG tablet, One tab daily for three days, Disp: 3 each, Rfl: 0 .  carvedilol (COREG) 6.25 MG tablet, Take 1 tablet (6.25 mg total) by mouth 2 (two) times daily., Disp: 60 tablet, Rfl: 0 .  cefUROXime (CEFTIN) 500 MG tablet, Take 1 tablet (500 mg total) by mouth 2 (two) times daily with a meal., Disp: 14 tablet, Rfl: 0 .  fluticasone (FLONASE) 50 MCG/ACT nasal spray, Reported on 03/12/2015, Disp: , Rfl:  .  ipratropium-albuterol (DUONEB) 0.5-2.5 (3) MG/3ML SOLN, Take 3 mLs by nebulization every 6 (six) hours., Disp: 360 mL, Rfl: 2 .  ketorolac (ACULAR) 0.4 % SOLN, 1 drop 2 (two) times daily. , Disp: , Rfl:  .  mometasone-formoterol (DULERA) 100-5 MCG/ACT AERO, Inhale 2 puffs into the lungs 2 (two) times daily., Disp: 1 Inhaler, Rfl: 1 .  pantoprazole (PROTONIX) 40 MG tablet, Take 1 tablet (40 mg total) by mouth daily., Disp: 30 tablet, Rfl: 11 .  predniSONE (DELTASONE) 20 MG tablet, Three tablets daily until tapered off by Vincent [name], Disp: 90 tablet, Rfl: 0 .  rivastigmine (EXELON) 9.5 mg/24hr, APPLY ONE PATCH TO SKIN AND CHANGE DAILY, Disp: 60 patch, Rfl: 2 .  rosuvastatin (CRESTOR) 20 MG tablet, Take by mouth., Disp: , Rfl:  .  VIGAMOX 0.5 % ophthalmic solution, Place 1 drop into the left eye 4 (four) times daily. , Disp: , Rfl:  .  zolpidem (AMBIEN) 10 MG tablet, Take 10 mg  by mouth at bedtime as needed. , Disp: , Rfl:   Past Medical History: Past Medical History  Diagnosis Date  . Coronary artery disease     Cardiac cath September 2008: 20% left main stenosis, 40% mid LAD stenosis, 99% ostial first diagonal stenosis in a large branch and 90% ostial disease in second diagonal but the vessel was very small, 20% mid RCA stenosis with normal ejection fraction. Successful angioplasty and drug-eluting stent placement to the ostial first diagonal with a 2.5 x 15 mm Xience stent  . Hyperlipidemia   . Abnormal nuclear cardiac imaging test     Vincent. Fletcher Anon  . Cataract   . Sleep apnea     has not used CPAP recently.  Feels condition has improved.  . Hypertension     BP has never been high(per pt).  Metoprolol is to keep BP low because of stent (afterload)  . Shortness of breath dyspnea     with exertion  . Wears hearing aid     (sometimes)    Tobacco Use: History  Smoking status  . Never Smoker   Smokeless tobacco  . Not on file    Labs: Recent Review Flowsheet Data    Labs for ITP Cardiac and Pulmonary Rehab Latest Ref Rng 05/12/2012 07/23/2013 03/20/2015   Cholestrol 100 - 199 mg/dL 130 122 -   LDLCALC 0 - 99 mg/dL 84 72 -  HDL >39 mg/dL 34(L) 39(L) -   Trlycerides 0 - 149 mg/dL 62 55 -   PHART 7.350 - 7.450 - - 7.44   PCO2ART 32.0 - 48.0 mmHg - - 32   HCO3 21.0 - 28.0 mEq/L - - 21.7   ACIDBASEDEF 0.0 - 2.0 mmol/L - - 1.6   O2SAT - - - 93.7       ADL UCSD:     Pulmonary Assessment Scores      04/22/15 1300       ADL UCSD   ADL Phase Entry     SOB Score total 24     Rest 0     Walk 0     Stairs 1     Bath 0     Dress 0     Shop 2        Pulmonary Function Assessment:     Pulmonary Function Assessment - 04/22/15 1300    Pulmonary Function Tests   RV% 60 %   DLCO% 36 %   Initial Spirometry Results   FVC% 77 %   FEV1% 75 %   FEV1/FVC Ratio 75   Post Bronchodilator Spirometry Results   FVC% 77 %   FEV1% 66 %   FEV1/FVC Ratio  66   Breath   Bilateral Breath Sounds [place];Basilar   Shortness of Breath Yes;Fear of Shortness of Breath;Limiting activity      Exercise Target Goals:    Exercise Program Goal: Individual exercise prescription set with THRR, safety & activity barriers. Participant demonstrates ability to understand and report RPE using BORG scale, to self-measure pulse accurately, and to acknowledge the importance of the exercise prescription.  Exercise Prescription Goal: Starting with aerobic activity 30 plus minutes a day, 3 days per week for initial exercise prescription. Provide home exercise prescription and guidelines that participant acknowledges understanding prior to discharge.  Activity Barriers & Risk Stratification:     Activity Barriers & Cardiac Risk Stratification - 04/22/15 1300    Activity Barriers & Cardiac Risk Stratification   Activity Barriers Shortness of Breath;Deconditioning;Muscular Weakness   Cardiac Risk Stratification Moderate      6 Minute Walk:     6 Minute Walk      04/22/15 1634 06/06/15 1053     6 Minute Walk   Phase Initial Mid Program    Distance 1460 feet 1230 feet    Distance % Change  -16 %    Walk Time 6 minutes 6 minutes    # of Rest Breaks  0    RPE 13 15    Perceived Dyspnea  2 4    Symptoms No No    Resting HR 65 bpm 67 bpm    Resting BP 126/68 mmHg 130/62 mmHg    Max Ex. HR 98 bpm 90 bpm    Max Ex. BP 146/78 mmHg 166/78 mmHg       Initial Exercise Prescription:     Initial Exercise Prescription - 04/22/15 1600    Date of Initial Exercise RX and Referring Provider   Date 04/22/15   Oxygen   Oxygen Intermittent   Liters 4   Treadmill   MPH 2.4   Grade 0   Minutes 10   Recumbant Bike   Level 2   RPM 40   Watts 20   Minutes 10   NuStep   Level 2   Watts 40   Minutes 10   Arm Ergometer   Level 1   Watts 10  Minutes 10   Elliptical   Level 1   Speed 3   Minutes 1   REL-XR   Level 3   Watts 50   Minutes 10   T5  Nustep   Level 1   Watts 15   Minutes 10   Biostep-RELP   Level 2   Watts 40   Minutes 10   Prescription Details   Frequency (times per week) 3   Duration Progress to 30 minutes of continuous aerobic without signs/symptoms of physical distress   Intensity   THRR REST +  30   Ratings of Perceived Exertion 11-15   Perceived Dyspnea 2-4   Progression   Progression Continue progressive overload as per policy without signs/symptoms or physical distress.   Resistance Training   Training Prescription Yes   Weight 2   Reps 10-15      Perform Capillary Blood Glucose checks as needed.  Exercise Prescription Changes:     Exercise Prescription Changes      04/25/15 1000 04/25/15 1500 04/30/15 1100 05/05/15 1000 05/07/15 1200   Response to Exercise   Blood Pressure (Admit) 124/82 mmHg    158/62 mmHg   Blood Pressure (Exercise) 146/74 mmHg    160/80 mmHg   Blood Pressure (Exit) 124/62 mmHg    98/62 mmHg   Heart Rate (Admit) 73 bpm    73 bpm   Heart Rate (Exercise) 95 bpm    100 bpm   Heart Rate (Exit) 71 bpm    83 bpm   Oxygen Saturation (Admit) 100 %    99 %   Oxygen Saturation (Exercise) 93 %    91 %   Oxygen Saturation (Exit) 99 %    99 %   Rating of Perceived Exertion (Exercise) 11    12   Perceived Dyspnea (Exercise) 2    3   Symptoms None   none Nonw   Comments First day of exercise! Patient was oriented to the gym and the equipment functions and settings. Procedures and policies of the gym were outlined and explained. The patient's individual exercise prescription and treatment plan were reviewed with them. All starting workloads were established based on the results of the functional testing  done at the initial intake visit. The plan for exercise progression was also introduced and progression will be customized based on the patient's performance and goals.    Increases in exercise prescription were reviewed with the patient. He tolerated the increase with no signs and  symptoms.  Vincent Kennedy is progressing well in the program and can consistently exercise for the enite 45 minutes of aerobic exericse. We will focus progression efforts on intensity as the patient is able to tolerate them.    Duration Progress to 30 minutes of continuous aerobic without signs/symptoms of physical distress   Progress to 30 minutes of continuous aerobic without signs/symptoms of physical distress Progress to 30 minutes of continuous aerobic without signs/symptoms of physical distress   Intensity Rest + 30   Rest + 30 Rest + 30   Progression   Progression Continue progressive overload as per policy without signs/symptoms or physical distress.   Continue progressive overload as per policy without signs/symptoms or physical distress. Continue progressive overload as per policy without signs/symptoms or physical distress.   Resistance Training   Training Prescription (read-only) Yes  Yes Yes Yes   Weight (read-only) _0 Reps (read-only) 10-15  10-15 10-15 10-15   Treadmill   MPH (  read-only) 2.4 2.8 2.8 2.8 2.8   Grade (read-only) 0 0 0 0 0   Minutes (read-only) _0 NuStep   Level (read-only) _1 Watts (read-only) 40 40 40 40 40   Minutes (read-only) _2 Recumbant Elliptical   Level (read-only) _3 RPM (read-only)    35 35   Watts (read-only) 50       Minutes (read-only) _4 05/12/15 1000 05/19/15 1000 05/23/15 1000 05/26/15 0646 07/07/15 1100   Exercise Review   Progression Yes Yes Yes  Yes   Response to Exercise   Blood Pressure (Admit)    124/68 mmHg 128/64 mmHg   Blood Pressure (Exercise)    138/60 mmHg 158/60 mmHg   Blood Pressure (Exit)    122/62 mmHg    Heart Rate (Admit)    68 bpm 74 bpm   Heart Rate (Exercise)    97 bpm 94 bpm   Heart Rate (Exit)    74 bpm    Oxygen Saturation (Admit)    97 % 93 %   Oxygen Saturation (Exercise)    96 % 93 %   Oxygen Saturation (Exit)    99 %    Rating of Perceived Exertion  (Exercise)    13 13   Perceived Dyspnea (Exercise)    3 3   Symptoms _5    Comments Reviewed individualized exercise prescription and made increases per departmental policy. Exercise increases were discussed with the patient and they were able to perform the new work loads without issue (no signs or symptoms).  Reviewed individualized exercise prescription and made increases per departmental policy. Exercise increases were discussed with the patient and they were able to perform the new work loads without issue (no signs or symptoms).  Reviewed individualized exercise prescription and made increases per departmental policy. Exercise increases were discussed with the patient and they were able to perform the new work loads without issue (no signs or symptoms).      Duration Progress to 30 minutes of continuous aerobic without signs/symptoms of physical distress Progress to 30 minutes of continuous aerobic without signs/symptoms of physical distress Progress to 30 minutes of continuous aerobic without signs/symptoms of physical distress Progress to 30 minutes of continuous aerobic without signs/symptoms of physical distress Progress to 30 minutes of continuous aerobic without signs/symptoms of physical distress   Intensity Rest + 30 Rest + 30 Rest + 30 Rest + 30 Rest + 30   Progression   Progression Continue progressive overload as per policy without signs/symptoms or physical distress. Continue progressive overload as per policy without signs/symptoms or physical distress. Continue progressive overload as per policy without signs/symptoms or physical distress. Continue progressive overload as per policy without signs/symptoms or physical distress. Continue progressive overload as per policy without signs/symptoms or physical distress.   Resistance Training   Training Prescription     Yes   Weight     4   Reps     10-15   Training Prescription (read-only) Yes Yes      Weight  (read-only) 4 4      Reps (read-only) 10-15 10-15      Treadmill   MPH     2.6   Grade     0   Minutes     15   MPH (read-only) 2.8  2.8      Grade (read-only) 0 0      Minutes (read-only) 15 15      NuStep   Level     6   Watts     50   Minutes     15   Level (read-only) 4 5      Watts (read-only) 40 45      Minutes (read-only) 15 15      Recumbant Elliptical   Level     6   Watts     35   Minutes     15   Level (read-only) 4 4      RPM (read-only) 35 35      Minutes (read-only) 15 15      REL-XR   Level   6 6 --   Watts   35 35 --   Level (read-only) 3 3      Watts (read-only) 50 50      Minutes (read-only) 15 15         Exercise Comments:     Exercise Comments      06/06/15 1052           Exercise Comments Decreased on Mid 6MW test from intake distance. Patient insisted doing the walk at 2L/C of O2 to match what he did at the beginning, but he was advised to use 3-6L/C of O2. This may have led to a decrease in distance          Discharge Exercise Prescription (Final Exercise Prescription Changes):     Exercise Prescription Changes - 07/07/15 1100    Exercise Review   Progression Yes   Response to Exercise   Blood Pressure (Admit) 128/64 mmHg   Blood Pressure (Exercise) 158/60 mmHg   Heart Rate (Admit) 74 bpm   Heart Rate (Exercise) 94 bpm   Oxygen Saturation (Admit) 93 %   Oxygen Saturation (Exercise) 93 %   Rating of Perceived Exertion (Exercise) 13   Perceived Dyspnea (Exercise) 3   Symptoms None   Duration Progress to 30 minutes of continuous aerobic without signs/symptoms of physical distress   Intensity Rest + 30   Progression   Progression Continue progressive overload as per policy without signs/symptoms or physical distress.   Resistance Training   Training Prescription Yes   Weight 4   Reps 10-15   Treadmill   MPH 2.6   Grade 0   Minutes 15   NuStep   Level 6   Watts 50   Minutes 15   Recumbant Elliptical   Level 6   Watts 35    Minutes 15   REL-XR   Level --   Watts --       Nutrition:  Target Goals: Understanding of nutrition guidelines, daily intake of sodium <1549m, cholesterol <2027m calories 30% from fat and 7% or less from saturated fats, daily to have 5 or more servings of fruits and vegetables.  Biometrics:     Pre Biometrics - 04/22/15 1637    Pre Biometrics   Height _0  (1.702 m)   Weight 164 lb 8 oz (74.617 kg)   Waist Circumference 38 inches   Hip Circumference 39 inches   Waist to Hip Ratio 0.97 %   BMI (Calculated) 25.8       Nutrition Therapy Plan and Nutrition Goals:     Nutrition Therapy & Goals - 04/22/15 1300    Nutrition Therapy   Diet Vincent BhEmilio Mathould like  to meet with the dietitian. He has recently lost about 10lbs by cutting his portion amounts. He does have a garden and eats a lot of vegetables. He drinks a mixture of juice and water and drink  8-9 glasses a day.      Nutrition Discharge: Rate Your Plate Scores:   Psychosocial: Target Goals: Acknowledge presence or absence of depression, maximize coping skills, provide positive support system. Participant is able to verbalize types and ability to use techniques and skills needed for reducing stress and depression.  Initial Review & Psychosocial Screening:     Initial Psych Review & Screening - 04/22/15 1300    Family Dynamics   Good Support System? Yes   Comments Vincent Kennedy has good support from his wife and 6 children. He is concerned with this new diagnosis of Pulmonary Fibrosis and will know more when he attends his [facility] appointment with the pulmonologist who specializes in Interstitual diseases.   Barriers   Psychosocial barriers to participate in program The patient should benefit from training in stress management and relaxation.   Screening Interventions   Interventions Encouraged to exercise;Program counselor consult      Quality of Life Scores:     Quality of Life - 04/22/15 1300    Quality of  Life Scores   Health/Function Pre 17.38 %   Socioeconomic Pre 23.57 %   Psych/Spiritual Pre 22.5 %   Family Pre 24.4 %   GLOBAL Pre 20.64 %      PHQ-9:     Recent Review Flowsheet Data    Depression screen [facility] 2/9 04/22/2015 10/16/2014   Decreased Interest 0 0   Down, Depressed, Hopeless 0 0   PHQ - 2 Score 0 0   Altered sleeping 0 -   Change in appetite 0 -   Feeling bad or failure about yourself  0 -   Trouble concentrating 0 -   Moving slowly or fidgety/restless 3 -   Suicidal thoughts 0 -   PHQ-9 Score 3 -   Difficult doing work/chores Somewhat difficult -      Psychosocial Evaluation and Intervention:     Psychosocial Evaluation - 05/05/15 1116    Psychosocial Evaluation & Interventions   Interventions Encouraged to exercise with the program and follow exercise prescription   Comments Counselor met with Vincent Kennedy today for initial psychosocial evaluation.  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  He has a strong support system with a spouse of 32 years and close friends.   He is also actively involved in his local faith community.  Vincent Kennedy reports that he sleeps well typically and has a good appetite.  He is generally in a positive mood and denies a history or current symptoms of depression or anxiety.  Vincent Kennedy states that his health is his primary stressor currently.  He has goals to get off of his Oxygen as much and increase his stamina and strength while in this program.  He plans to purchase some home equipment to help exercise more consistently.  Counselor will continue to follow with Vincent Kennedy.    Continued Psychosocial Services Needed Yes  Consistent exercise is recommended and Vincent. M will benefit from the psychoeducational components of this program.        Psychosocial Re-Evaluation:     Psychosocial Re-Evaluation      07/02/15 1050           Psychosocial Re-Evaluation  Comments  Counselor follow up with Vincent Kennedy reporting he has been out of class for awhile due to the birth of a new granddaughter in the [place] area.  He states he is sleeping well and his appetite is good.  He denies current symptoms of depression or anxiety and states his mood is generally positive most of the time.  Vincent Kennedy reports he has seen just a little improvement in his breathing since beginning this program.  He also reports a change in medications several weeks ago to help with this, but has not noticed any improvement to date.  Vincent Kennedy still plans to purchase some home gym equipment to continue consistent exercise once this program is completed.           Education: Education Goals: Education classes will be provided on a weekly basis, covering required topics. Participant will state understanding/return demonstration of topics presented.  Learning Barriers/Preferences:     Learning Barriers/Preferences - 04/22/15 1300    Learning Barriers/Preferences   Learning Barriers None   Learning Preferences Group Instruction;Individual Instruction;Pictoral;Skilled Demonstration;Verbal Instruction;Written Material      Education Topics: Initial Evaluation Education: - Verbal, written and demonstration of respiratory meds, RPE/PD scales, oximetry and breathing techniques. Instruction on use of nebulizers and MDIs: cleaning and proper use, rinsing mouth with steroid doses and importance of monitoring MDI activations.          Pulmonary Rehab from 05/30/2015 in [facility] Cardiac and Pulmonary Rehab   Date  04/22/15   Educator  LB   Instruction Review Code  2- meets goals/outcomes      General Nutrition Guidelines/Fats and Fiber: -Group instruction provided by verbal, written material, models and posters to present the general guidelines for heart healthy nutrition. Gives an explanation and review of dietary fats and fiber.      Pulmonary Rehab from 05/30/2015 in [facility] Cardiac and Pulmonary Rehab    Date  04/28/15   Educator  CE   Instruction Review Code  2- meets goals/outcomes      Controlling Sodium/Reading Food Labels: -Group verbal and written material supporting the discussion of sodium use in heart healthy nutrition. Review and explanation with models, verbal and written materials for utilization of the food label.      Pulmonary Rehab from 05/30/2015 in [facility] Cardiac and Pulmonary Rehab   Date  05/12/15   Educator  CR   Instruction Review Code  2- meets goals/outcomes      Exercise Physiology & Risk Factors: - Group verbal and written instruction with models to review the exercise physiology of the cardiovascular system and associated critical values. Details cardiovascular disease risk factors and the goals associated with each risk factor.   Aerobic Exercise & Resistance Training: - Gives group verbal and written discussion on the health impact of inactivity. On the components of aerobic and resistive training programs and the benefits of this training and how to safely progress through these programs.   Flexibility, Balance, General Exercise Guidelines: - Provides group verbal and written instruction on the benefits of flexibility and balance training programs. Provides general exercise guidelines with specific guidelines to those with heart or lung disease. Demonstration and skill practice provided.      Pulmonary Rehab from 05/30/2015 in [facility] Cardiac and Pulmonary Rehab   Date  05/07/15   Educator  RM   Instruction Review Code  2- meets goals/outcomes      Stress Management: - Provides group verbal and written instruction about the health risks of  elevated stress, cause of high stress, and healthy ways to reduce stress.      Pulmonary Rehab from 05/30/2015 in [facility] Cardiac and Pulmonary Rehab   Date  05/14/15   Educator  [facility]   Instruction Review Code  2- meets goals/outcomes      Depression: - Provides group verbal and written instruction on the correlation  between heart/lung disease and depressed mood, treatment options, and the stigmas associated with seeking treatment.   Exercise & Equipment Safety: - Individual verbal instruction and demonstration of equipment use and safety with use of the equipment.      Pulmonary Rehab from 05/30/2015 in [facility] Cardiac and Pulmonary Rehab   Date  04/25/15   Educator  [place]   Instruction Review Code  2- meets goals/outcomes      Infection Prevention: - Provides verbal and written material to individual with discussion of infection control including proper hand washing and proper equipment cleaning during exercise session.      Pulmonary Rehab from 05/30/2015 in [facility] Cardiac and Pulmonary Rehab   Date  04/25/15   Educator  [place]   Instruction Review Code  2- meets goals/outcomes      Falls Prevention: - Provides verbal and written material to individual with discussion of falls prevention and safety.      Pulmonary Rehab from 05/30/2015 in [facility] Cardiac and Pulmonary Rehab   Date  04/22/15   Educator  LB   Instruction Review Code  2- meets goals/outcomes      Diabetes: - Individual verbal and written instruction to review signs/symptoms of diabetes, desired ranges of glucose level fasting, after meals and with exercise. Advice that pre and post exercise glucose checks will be done for 3 sessions at entry of program.   Chronic Lung Diseases: - Group verbal and written instruction to review new updates, new respiratory medications, new advancements in procedures and treatments. Provide informative websites and "800" numbers of self-education.      Pulmonary Rehab from 05/30/2015 in [facility] Cardiac and Pulmonary Rehab   Date  05/05/15   Educator  LB   Instruction Review Code  2- meets goals/outcomes      Lung Procedures: - Group verbal and written instruction to describe testing methods done to diagnose lung disease. Review the outcome of test results. Describe the treatment choices: Pulmonary Function  Tests, ABGs and oximetry.      Pulmonary Rehab from 05/30/2015 in [facility] Cardiac and Pulmonary Rehab   Date  05/02/15   Educator  [place]   Instruction Review Code  2- meets goals/outcomes      Energy Conservation: - Provide group verbal and written instruction for methods to conserve energy, plan and organize activities. Instruct on pacing techniques, use of adaptive equipment and posture/positioning to relieve shortness of breath.   Triggers: - Group verbal and written instruction to review types of environmental controls: home humidity, furnaces, filters, dust mite/pet prevention, HEPA vacuums. To discuss weather changes, air quality and the benefits of nasal washing.   Exacerbations: - Group verbal and written instruction to provide: warning signs, infection symptoms, calling MD promptly, preventive modes, and value of vaccinations. Review: effective airway clearance, coughing and/or vibration techniques. Create an Sports administrator.   Oxygen: - Individual and group verbal and written instruction on oxygen therapy. Includes supplement oxygen, available portable oxygen systems, continuous and intermittent flow rates, oxygen safety, concentrators, and Medicare reimbursement for oxygen.      Pulmonary Rehab from 05/30/2015 in [facility] Cardiac and Pulmonary Rehab  Date  04/22/15   Educator  LB   Instruction Review Code  2- meets goals/outcomes      Respiratory Medications: - Group verbal and written instruction to review medications for lung disease. Drug class, frequency, complications, importance of spacers, rinsing mouth after steroid MDI's, and proper cleaning methods for nebulizers.      Pulmonary Rehab from 05/30/2015 in [facility] Cardiac and Pulmonary Rehab   Date  04/22/15   Educator  LB   Instruction Review Code  2- meets goals/outcomes      AED/CPR: - Group verbal and written instruction with the use of models to demonstrate the basic use of the AED with the basic ABC's of resuscitation.       Pulmonary Rehab from 05/30/2015 in [facility] Cardiac and Pulmonary Rehab   Date  05/23/15   Educator  CE   Instruction Review Code  2- meets goals/outcomes      Breathing Retraining: - Provides individuals verbal and written instruction on purpose, frequency, and proper technique of diaphragmatic breathing and pursed-lipped breathing. Applies individual practice skills.      Pulmonary Rehab from 05/30/2015 in [facility] Cardiac and Pulmonary Rehab   Date  04/22/15   Educator  SJ   Instruction Review Code  2- meets goals/outcomes      Anatomy and Physiology of the Lungs: - Group verbal and written instruction with the use of models to provide basic lung anatomy and physiology related to function, structure and complications of lung disease.   Heart Failure: - Group verbal and written instruction on the basics of heart failure: signs/symptoms, treatments, explanation of ejection fraction, enlarged heart and cardiomyopathy.   Sleep Apnea: - Individual verbal and written instruction to review Obstructive Sleep Apnea. Review of risk factors, methods for diagnosing and types of masks and machines for OSA.      Pulmonary Rehab from 05/30/2015 in [facility] Cardiac and Pulmonary Rehab   Date  04/22/15   Educator  LB   Instruction Review Code  2- meets goals/outcomes      Anxiety: - Provides group, verbal and written instruction on the correlation between heart/lung disease and anxiety, treatment options, and management of anxiety.   Relaxation: - Provides group, verbal and written instruction about the benefits of relaxation for patients with heart/lung disease. Also provides patients with examples of relaxation techniques.   Knowledge Questionnaire Score:     Knowledge Questionnaire Score - 04/22/15 1300    Knowledge Questionnaire Score   Pre Score 0/10       Core Components/Risk Factors/Patient Goals at Admission:     Personal Goals and Risk Factors at Admission - 04/22/15 1300    Core  Components/Risk Factors/Patient Goals on Admission    Weight Management Yes   Intervention (read-only) Learn and follow the exercise and diet guidelines while in the program. Utilize the nutrition and education classes to help gain knowledge of the diet and exercise expectations in the program  Vincent Kennedy does want to meet with the dietitian. He eats a lot of vegetables and has lowered his portion amounts. He drinks 8-9 glasses of water and juice a day.   Admit Weight 164 lb 8 oz (74.617 kg)   Goal Weight: Short Term 160 lb (72.576 kg)   Sedentary Yes   Intervention (read-only) While in program, learn and follow the exercise prescription taught. Start at a low level workload and increase workload after able to maintain previous level for 30 minutes. Increase time before increasing intensity.  Vincent Kennedy  is very active with yardwork and gardening and wants to continue exercising. He is considering a TM or EL for home use.   Improve shortness of breath with ADL's Yes  shortness of breath. He will benefit with monitoring his O2Sats and pacing.   Intervention (read-only) --  Vincent Kennedy has modified his activity with his increased shortness of breath. Family and friends noticed it, but Vincent Kennedy  did not  address the issue. Now that he has been diagonised with Pulmonary fibrosis, he can look back and see his increased    Develop more efficient breathing techniques such as purse lipped breathing and diaphragmatic breathing; and practicing self-pacing with activity Yes   Intervention (read-only) While in program, learn and utilize the specific breathing techniques taught to you. Continue to practice and use the techniques as needed.  Instructed Vincent Kennedy on PLB and its benefit with shortness of breath with activity.   Increase knowledge of respiratory medications and ability to use respiratory devices properly  Yes   Intervention (read-only) While in program learn and demonstrate appropriate use of your  oxygen therapy by increasing flow with exertion, manage oxygen tank operation, including continuous and intermittent flow.  Understanding oxygen is a drug ordered by your physician.;While in program, learn to administer MDI, nebulizer, and spacer properly.;Learn to take respiratory medicine as ordered.;While in program, learn to Clean MDI, nebulizers, and spacers properly.  Vincent Kennedy uses [facility] and a SVN with Albuterol .  He has oxygen: 2l/m rest and 3-4l/m activity. He has a Marine scientist with intermittent flow and uses [facility]. Vincent Kennedy wears a full face mask with his CPAP.   Understand more about Heart/Pulmonary Disease. Yes   Intervention While in program utilize professionals for any questions, and attend the education sessions. Great websites to use are www.americanheart.org or www.lung.org for reliable information.  Vincent Kennedy was recently diagnosed with Pulmonary Fibrosis. He has been referred to [facility] to see a specialist and is very interested in learning more about his disease.      Core Components/Risk Factors/Patient Goals Review:      Goals and Risk Factor Review      04/22/15 1300 04/25/15 1507 04/28/15 1000 04/28/15 1056 05/02/15 1415   Core Components/Risk Factors/Patient Goals Review   Personal Goals Review Improve shortness of breath with ADL's Develop more efficient breathing techniques such as purse lipped breathing and diaphragmatic breathing and practicing self-pacing with activity. Understand more about Heart/Pulmonary Disease Increase Aerobic Exercise and Physical Activity    Increase Aerobic Exercise and Physical Activity (read-only)   Comments    Vincent Kennedy is on his second day of exercise in Pulmonary Rehab. We discussed his goals of wanting to be able to get back to doing yard work, gardening, and walking which are activities he enjoys. He stated that there is an incline on his driveway and several steps that he needs to climb to do these activities. We  discussed how his exerciser routine can hopefully allow him to be able to do more without getting as short of breath, and eventurally get back to doing these ADL's.    Understand more about Heart/Pulmonary Disease (read-only)   Goals Progress/Improvement seen    Yes     Comments   Gave Vincent Lodes a copy of the Pulmonary Fibrosis Guide for Patients from the PF Association which has great educational material.     Improve shortness of breath with ADL's (read-only)   Goals Progress/Improvement seen  Yes   Comments     Vincent. Chiappetta says that he notices less shorness of breath when walking down his driveway.  He wants to try it without his O2 but he was advised against it at this time.   Breathing Techniques (read-only)   Goals Progress/Improvement seen   Yes Yes  Yes   Comments  Taught PLB today. Encouraged Vincent Kennedy to use PLB on the treadmill; it is helpful to him with his shortness of breath       05/19/15 1542 05/27/15 0852 06/04/15 1036 06/04/15 1459 06/11/15 1521   Core Components/Risk Factors/Patient Goals Review   Personal Goals Review Improve shortness of breath with ADL's Sedentary Other Improve shortness of breath with ADL's;Develop more efficient breathing techniques such as purse lipped breathing and diaphragmatic breathing and practicing self-pacing with activity. Increase knowledge of respiratory medications and ability to use respiratory devices properly.   Review Vincent Rogue Kennedy had increased shortness of breath on the treadmill today. He had a big breakfast at 9:00am and states that could have increased his shortness of breath and lowered his O2Sat's.  Vincent Arrie Senate is learning to adjust his oxygen, but does have to be reminded at times.  Increasing activity Vincent. Emilio Kennedy has missed a few sessions and was discouraged about his weight and O2 saturation. He is determined to not lose his conditioning and the progress he has made. Vincent Kennedy was given information on the IPF Support Group which meets  tomorrow evening. I gave him a copy of the IPF Foundation publication. Vincent Kennedy starts on [place] tomorrow and discussed the benefits of slowing the progression of his IPF and hepl with his shortness of breath. He is using PLB and is adjusting his oxygen flow rates with activity. Vincent Kennedy will start on his 2 pills/3 times a day of the [place]. He has tolerated the drug well.    Expected Outcomes Independence with adjusting oxygen with increased activity. Vincent. Emilio Kennedy is remaining active outside of class, but the current exercise regimen is helping him strengthen.  He should be able to progress on all equipment within next two weeks pending regular attendance. Vincent. Emilio Kennedy will resume his previous workloads but with regular attendance and with him doing his home exercises he is projected to make exercise increases and progression by early next week.  Be able to tolerate Esbriet and see improvement in his lung function; continue adjusting oxygen with activity with exercise and activites at home. Ability to take 3 pills/ 3 times a day of the Esbriet without symptoms.     06/16/15 1000           Core Components/Risk Factors/Patient Goals Review   Personal Goals Review Increase knowledge of respiratory medications and ability to use respiratory devices properly.       Review Vincent Kennedy is now on the 2 pills of Esbriet and is doing well. He will start the 3 pill dosage soon. He was concerned with higher heart rate episodes, but his cardiologist, Vincent Fletcher Anon, examined him and was not concerned . He did not want to order a heart monitor at this time.       Expected Outcomes Continue with Esbriet symtom free.          Core Components/Risk Factors/Patient Goals at Discharge (Final Review):      Goals and Risk Factor Review - 06/16/15 1000    Core Components/Risk Factors/Patient Goals Review   Personal Goals Review Increase knowledge of respiratory medications and ability to use  respiratory devices properly.    Review Vincent Kennedy is now on the 2 pills of Esbriet and is doing well. He will start the 3 pill dosage soon. He was concerned with higher heart rate episodes, but his cardiologist, Vincent Fletcher Anon, examined him and was not concerned . He did not want to order a heart monitor at this time.   Expected Outcomes Continue with Esbriet symtom free.      ITP Comments:     ITP Comments      04/25/15 1020 04/25/15 1514 04/28/15 1019 04/30/15 1114 05/02/15 1037   ITP Comments Personal and exercise goals expected to be met in 34 more sessions. Progress on specific individualized goals will be charted in patient's ITP. Upon completion of the program the patient will be comfortable managing exercise goals and progression on their own.  Spoke with Vincent Kennedy today about setting goals and working on the goals while in the program. Will review nutriton goals  and set short term and long term goals after he has met with the RD.  Personal and exercise goals expected to be met in 33 more sessions. Progress on specific individualized goals will be charted in patient's ITP. Upon completion of the program the patient will be comfortable managing exercise goals and progression on their own.  Personal and exercise goals expected to be met in 32 more sessions. Progress on specific individualized goals will be charted in patient's ITP. Upon completion of the program the patient will be comfortable managing exercise goals and progression on their own.  Personal and exercise goals expected to be met in 31 more sessions. Progress on specific individualized goals will be charted in patient's ITP. Upon completion of the program the patient will be comfortable managing exercise goals and progression on their own.      05/02/15 1539 05/05/15 1033 05/07/15 1058 05/09/15 1054 05/12/15 1027   ITP Comments Vincent. Kennedy will meet with his Pulmonologist at [facility] for the first time on February 21st. Exercise and personal goals anticipated to be met in 30 more  sessions. See ITP for detailed report on goals.  Exercise and personal goals anticipated to be met in 29 more sessions. See ITP for detailed report on goals.  Personal and exercise goals expected to be met in 28 more sessions. Progress on specific individualized goals will be charted in patient's ITP. Upon completion of the program the patient will be comfortable managing exercise goals and progression on their own.  Personal and exercise goals expected to be met in 27 more sessions. Progress on specific individualized goals will be charted in patient's ITP. Upon completion of the program the patient will be comfortable managing exercise goals and progression on their own.      05/14/15 1055 05/16/15 1043 05/19/15 1051 05/23/15 1048 05/30/15 1028   ITP Comments Personal and exercise goals expected to be met in 26 more sessions. Progress on specific individualized goals will be charted in patient's ITP. Upon completion of the program the patient will be comfortable managing exercise goals and progression on their own.  Personal and exercise goals expected to be met in 25 more sessions. Progress on specific individualized goals will be charted in patient's ITP. Upon completion of the program the patient will be comfortable managing exercise goals and progression on their own.  Personal and exercise goals expected to be met in 24 more sessiosns. See ITP for specific notes no goal progression. Personal and exercise goals expected to be met in 23 more sessions.  See ITP for specific notes on goal progression.  Personal and exercise goals expected to be met in 20 more sessions. Progress on specific individualized goals will be charted in patient's ITP. Upon completion of the program the patient will be comfortable managing exercise goals and progression on their own.      06/16/15 1000 07/02/15 1121         ITP Comments Vincent Kennedy will be out of [name] for 10 days - he will be visiting his daughter. Has been out  for family reasons. Retruned today, no voiced concerns         Comments: 30 Day Review

## 2015-07-09 ENCOUNTER — Encounter: Payer: Medicare Other | Admitting: *Deleted

## 2015-07-09 DIAGNOSIS — J8410 Pulmonary fibrosis, unspecified: Secondary | ICD-10-CM

## 2015-07-09 NOTE — Progress Notes (Signed)
Daily Session Note  Patient Details  Name: Vincent Kennedy MRN: [id number] Date of Birth: [birth year] Referring Provider:    Encounter Date: 07/09/2015  Check In:     Session Check In - 07/09/15 1100    Check-In   Staff Present Heath Lark, RN, BSN, CCRP;Laureen Owens Shark, BS, RRT, Respiratory Therapist;Rebecca Brayton El, DPT, [place] physician immediately available to respond to emergencies [name] immediately available ER MD   Physician(s) Drs: Dineen Kid and Edd Fabian   Medication changes reported     No   Fall or balance concerns reported    No   VAD Patient? No   Pain Assessment   Currently in Pain? No/denies         Goals Met:  Independence with exercise equipment Exercise tolerated well Personal goals reviewed Strength training completed today  Goals Unmet:  Not Applicable  Comments: Doing well with exercise prescription progression.    Dr. Emily Filbert is Medical Director for [place] and [name].

## 2015-07-11 ENCOUNTER — Encounter: Payer: Medicare Other | Admitting: *Deleted

## 2015-07-11 DIAGNOSIS — J8410 Pulmonary fibrosis, unspecified: Secondary | ICD-10-CM | POA: Diagnosis not present

## 2015-07-11 NOTE — Progress Notes (Signed)
Daily Session Note  Patient Details  Name: Vincent Kennedy MRN: [id number] Date of Birth: [birth year] Referring Provider:    Encounter Date: 07/11/2015  Check In:     Session Check In - 07/11/15 1145    Check-In   Location [facility]-Cardiac & Pulmonary Rehab   Staff Present Heath Lark, RN, BSN, CCRP;Kendall Caprice [place], [place], Exercise Physiologist   Supervising physician immediately available to respond to emergencies [name] immediately available ER MD   Physician(s) Marcelene Butte and [place]   Fall or balance concerns reported    No   Warm-up and Cool-down Performed on first and last piece of equipment   Resistance Training Performed Yes   VAD Patient? No   Pain Assessment   Currently in Pain? No/denies         Goals Met:  Proper associated with RPD/PD & O2 Sat Exercise tolerated well  Goals Unmet:  Not Applicable  Comments:     Dr. Emily Filbert is Medical Director for [place] and [name].

## 2015-07-16 ENCOUNTER — Encounter: Payer: Medicare Other | Admitting: *Deleted

## 2015-07-16 DIAGNOSIS — J8410 Pulmonary fibrosis, unspecified: Secondary | ICD-10-CM

## 2015-07-16 NOTE — Progress Notes (Signed)
Daily Session Note  Patient Details  Name: ATA PECHA MRN: [id number] Date of Birth: 01-10-1933 Referring Provider:    Encounter Date: 07/16/2015  Check In:     Session Check In - 07/16/15 1128    Check-In   Staff Present Heath Lark, RN, BSN, CCRP;Laureen Owens Shark, BS, RRT, Respiratory Therapist;Rebecca Brayton El, DPT, CEEA   Supervising physician immediately available to respond to emergencies [name] immediately available ER MD   Physician(s) Drs: Cinda Quest and Joni Fears   Medication changes reported     No   Fall or balance concerns reported    No   Warm-up and Cool-down Performed on first and last piece of equipment   Resistance Training Performed No   VAD Patient? No   Pain Assessment   Currently in Pain? No/denies         Goals Met:  Independence with exercise equipment Exercise tolerated well  Goals Unmet:  Not Applicable  Comments: Doing well with exercise prescription progression.    Dr. Emily Filbert is Medical Director for [place] and [name].

## 2015-07-21 ENCOUNTER — Encounter: Payer: Medicare Other | Attending: Internal Medicine

## 2015-07-21 DIAGNOSIS — J8410 Pulmonary fibrosis, unspecified: Secondary | ICD-10-CM | POA: Insufficient documentation

## 2015-07-23 ENCOUNTER — Encounter: Payer: Medicare Other | Admitting: *Deleted

## 2015-07-23 DIAGNOSIS — J8410 Pulmonary fibrosis, unspecified: Secondary | ICD-10-CM

## 2015-07-23 NOTE — Progress Notes (Signed)
Daily Session Note  Patient Details  Name: ADELARD SANON MRN: [id number] Date of Birth: 06-Dec-1932 Referring Provider:    Encounter Date: 07/23/2015  Check In:     Session Check In - 07/23/15 1230    Check-In   Staff Present Heath Lark, RN, BSN, CCRP;Diane Joya Gaskins, RN, BSN;Laureen Owens Shark, BS, RRT, Respiratory Therapist   Supervising physician immediately available to respond to emergencies [name] immediately available ER MD   Physician(s) Drs: Jimmye Norman and Reita Cliche   Medication changes reported     No   Fall or balance concerns reported    No   Warm-up and Cool-down Performed on first and last piece of equipment   VAD Patient? No   Pain Assessment   Currently in Pain? No/denies         Goals Met:  Independence with exercise equipment Exercise tolerated well Strength training completed today  Goals Unmet:  Not Applicable  Comments: Doing well with exercise prescription progression.    Dr. Emily Filbert is Medical Director for [place] and [name].

## 2015-07-25 ENCOUNTER — Encounter: Payer: Medicare Other | Admitting: *Deleted

## 2015-07-25 DIAGNOSIS — J8410 Pulmonary fibrosis, unspecified: Secondary | ICD-10-CM

## 2015-07-25 NOTE — Progress Notes (Signed)
Daily Session Note  Patient Details  Name: Vincent Kennedy MRN: [id number] Date of Birth: 1933-02-04 Referring Provider:    Encounter Date: 07/25/2015  Check In:     Session Check In - 07/25/15 1125    Check-In   Staff Present Heath Lark, RN, BSN, CCRP;Carroll Enterkin, RN, Michaela Corner, RRT, RCP, Respiratory Therapist   Supervising physician immediately available to respond to emergencies [name] immediately available ER MD   Physician(s) Drs: Edd Fabian and Mariea Clonts   Medication changes reported     No   Fall or balance concerns reported    No   Warm-up and Cool-down Performed on first and last piece of equipment   VAD Patient? No   Pain Assessment   Currently in Pain? No/denies         Goals Met:  Proper associated with RPD/PD & O2 Sat Exercise tolerated well Strength training completed today  Goals Unmet:  Not Applicable  Comments: Doing well with exercise prescription progression.    Dr. Emily Filbert is Medical Director for [place] and [name].

## 2015-07-28 ENCOUNTER — Encounter: Payer: Medicare Other | Admitting: *Deleted

## 2015-07-28 DIAGNOSIS — J8410 Pulmonary fibrosis, unspecified: Secondary | ICD-10-CM | POA: Diagnosis not present

## 2015-07-28 NOTE — Progress Notes (Signed)
Daily Session Note  Patient Details  Name: Vincent Kennedy MRN: [id number] Date of Birth: [birth year] Referring Provider:    Encounter Date: 07/28/2015  Check In:     Session Check In - 07/28/15 1155    Check-In   Location [facility]-Cardiac & Pulmonary Rehab   Staff Present Carson Myrtle, BS, RRT, Respiratory Therapist;Carroll Enterkin, RN, Moises Blood, BS, ACSM CEP, Exercise Physiologist   Physician(s) Drs:  Reita Cliche and Schaevitz   Medication changes reported     No   Fall or balance concerns reported    No   Warm-up and Cool-down Performed on first and last piece of equipment   Resistance Training Performed Yes   VAD Patient? No   Pain Assessment   Currently in Pain? No/denies         Goals Met:  Proper associated with RPD/PD & O2 Sat Independence with exercise equipment Exercise tolerated well Strength training completed today  Goals Unmet:  Not Applicable  Comments:  Patient completed exercise prescription and all exercise goals during rehab session. The exercise was tolerated well and the patient is progressing in the program.    Dr. Emily Filbert is Medical Director for [place] and [name].

## 2015-08-04 ENCOUNTER — Encounter: Payer: Self-pay | Admitting: *Deleted

## 2015-08-04 DIAGNOSIS — J8410 Pulmonary fibrosis, unspecified: Secondary | ICD-10-CM

## 2015-08-04 NOTE — Progress Notes (Unsigned)
Pulmonary Individual Treatment Plan  Patient Details  Name: Vincent Kennedy MRN: [id number] Date of Birth: 1932/07/12 Referring Provider:   Dr. Devona Konig   Initial Encounter Date:       Pulmonary Rehab from 04/22/2015 in [facility] Cardiac and Pulmonary Rehab   Date  04/22/15      Visit Diagnosis: Pulmonary fibrosis ([place])  Patient's Home Medications on Admission:  Current outpatient prescriptions:  .  albuterol (PROVENTIL HFA;VENTOLIN HFA) 108 (90 Base) MCG/ACT inhaler, Inhale 2 puffs into the lungs every 6 (six) hours as needed for wheezing or shortness of breath., Disp: 1 Inhaler, Rfl: 2 .  azithromycin (ZITHROMAX) 250 MG tablet, One tab daily for three days, Disp: 3 each, Rfl: 0 .  carvedilol (COREG) 6.25 MG tablet, Take 1 tablet (6.25 mg total) by mouth 2 (two) times daily., Disp: 60 tablet, Rfl: 0 .  cefUROXime (CEFTIN) 500 MG tablet, Take 1 tablet (500 mg total) by mouth 2 (two) times daily with a meal., Disp: 14 tablet, Rfl: 0 .  fluticasone (FLONASE) 50 MCG/ACT nasal spray, Reported on 03/12/2015, Disp: , Rfl:  .  ipratropium-albuterol (DUONEB) 0.5-2.5 (3) MG/3ML SOLN, Take 3 mLs by nebulization every 6 (six) hours., Disp: 360 mL, Rfl: 2 .  ketorolac (ACULAR) 0.4 % SOLN, 1 drop 2 (two) times daily. , Disp: , Rfl:  .  mometasone-formoterol (DULERA) 100-5 MCG/ACT AERO, Inhale 2 puffs into the lungs 2 (two) times daily., Disp: 1 Inhaler, Rfl: 1 .  pantoprazole (PROTONIX) 40 MG tablet, Take 1 tablet (40 mg total) by mouth daily., Disp: 30 tablet, Rfl: 11 .  predniSONE (DELTASONE) 20 MG tablet, Three tablets daily until tapered off by Dr [name], Disp: 90 tablet, Rfl: 0 .  rivastigmine (EXELON) 9.5 mg/24hr, APPLY ONE PATCH TO SKIN AND CHANGE DAILY, Disp: 60 patch, Rfl: 2 .  rosuvastatin (CRESTOR) 20 MG tablet, Take by mouth., Disp: , Rfl:  .  VIGAMOX 0.5 % ophthalmic solution, Place 1 drop into the left eye 4 (four) times daily. , Disp: , Rfl:  .  zolpidem (AMBIEN) 10 MG tablet, Take 10  mg by mouth at bedtime as needed. , Disp: , Rfl:   Past Medical History: Past Medical History  Diagnosis Date  . Coronary artery disease     Cardiac cath September 2008: 20% left main stenosis, 40% mid LAD stenosis, 99% ostial first diagonal stenosis in a large branch and 90% ostial disease in second diagonal but the vessel was very small, 20% mid RCA stenosis with normal ejection fraction. Successful angioplasty and drug-eluting stent placement to the ostial first diagonal with a 2.5 x 15 mm Xience stent  . Hyperlipidemia   . Abnormal nuclear cardiac imaging test     Dr. Fletcher Anon  . Cataract   . Sleep apnea     has not used CPAP recently.  Feels condition has improved.  . Hypertension     BP has never been high(per pt).  Metoprolol is to keep BP low because of stent (afterload)  . Shortness of breath dyspnea     with exertion  . Wears hearing aid     (sometimes)    Tobacco Use: History  Smoking status  . Never Smoker   Smokeless tobacco  . Not on file    Labs: Recent Review Flowsheet Data    Labs for ITP Cardiac and Pulmonary Rehab Latest Ref Rng 05/12/2012 07/23/2013 03/20/2015   Cholestrol 100 - 199 mg/dL 130 122 -   LDLCALC 0 - 99 mg/dL  84 72 -   HDL >39 mg/dL 34(L) 39(L) -   Trlycerides 0 - 149 mg/dL 62 55 -   PHART 7.350 - 7.450 - - 7.44   PCO2ART 32.0 - 48.0 mmHg - - 32   HCO3 21.0 - 28.0 mEq/L - - 21.7   ACIDBASEDEF 0.0 - 2.0 mmol/L - - 1.6   O2SAT - - - 93.7       ADL UCSD:     Pulmonary Assessment Scores      04/22/15 1300       ADL UCSD   ADL Phase Entry     SOB Score total 24     Rest 0     Walk 0     Stairs 1     Bath 0     Dress 0     Shop 2        Pulmonary Function Assessment:     Pulmonary Function Assessment - 04/22/15 1300    Pulmonary Function Tests   RV% 60 %   DLCO% 36 %   Initial Spirometry Results   FVC% 77 %   FEV1% 75 %   FEV1/FVC Ratio 75   Post Bronchodilator Spirometry Results   FVC% 77 %   FEV1% 66 %   FEV1/FVC  Ratio 66   Breath   Bilateral Breath Sounds Rales;Basilar   Shortness of Breath Yes;Fear of Shortness of Breath;Limiting activity      Exercise Target Goals:    Exercise Program Goal: Individual exercise prescription set with THRR, safety & activity barriers. Participant demonstrates ability to understand and report RPE using BORG scale, to self-measure pulse accurately, and to acknowledge the importance of the exercise prescription.  Exercise Prescription Goal: Starting with aerobic activity 30 plus minutes a day, 3 days per week for initial exercise prescription. Provide home exercise prescription and guidelines that participant acknowledges understanding prior to discharge.  Activity Barriers & Risk Stratification:     Activity Barriers & Cardiac Risk Stratification - 04/22/15 1300    Activity Barriers & Cardiac Risk Stratification   Activity Barriers Shortness of Breath;Deconditioning;Muscular Weakness   Cardiac Risk Stratification Moderate      6 Minute Walk:     6 Minute Walk      04/22/15 1634 06/06/15 1053     6 Minute Walk   Phase Initial Mid Program    Distance 1460 feet 1230 feet    Distance % Change  -16 %    Walk Time 6 minutes 6 minutes    # of Rest Breaks  0    RPE 13 15    Perceived Dyspnea  2 4    Symptoms No No    Resting HR 65 bpm 67 bpm    Resting BP 126/68 mmHg 130/62 mmHg    Max Ex. HR 98 bpm 90 bpm    Max Ex. BP 146/78 mmHg 166/78 mmHg       Initial Exercise Prescription:     Initial Exercise Prescription - 04/22/15 1600    Date of Initial Exercise RX and Referring Provider   Date 04/22/15   Oxygen   Oxygen Intermittent   Liters 4   Treadmill   MPH 2.4   Grade 0   Minutes 10   Recumbant Bike   Level 2   RPM 40   Watts 20   Minutes 10   NuStep   Level 2   Watts 40   Minutes 10   Arm Ergometer   Level  1   Watts 10   Minutes 10   Elliptical   Level 1   Speed 3   Minutes 1   REL-XR   Level 3   Watts 50   Minutes 10    T5 Nustep   Level 1   Watts 15   Minutes 10   Biostep-RELP   Level 2   Watts 40   Minutes 10   Prescription Details   Frequency (times per week) 3   Duration Progress to 30 minutes of continuous aerobic without signs/symptoms of physical distress   Intensity   THRR REST +  30   Ratings of Perceived Exertion 11-15   Perceived Dyspnea 2-4   Progression   Progression Continue progressive overload as per policy without signs/symptoms or physical distress.   Resistance Training   Training Prescription Yes   Weight 2   Reps 10-15      Perform Capillary Blood Glucose checks as needed.  Exercise Prescription Changes:     Exercise Prescription Changes      04/25/15 1000 04/25/15 1500 04/30/15 1100 05/05/15 1000 05/07/15 1200   Response to Exercise   Blood Pressure (Admit) 124/82 mmHg    158/62 mmHg   Blood Pressure (Exercise) 146/74 mmHg    160/80 mmHg   Blood Pressure (Exit) 124/62 mmHg    98/62 mmHg   Heart Rate (Admit) 73 bpm    73 bpm   Heart Rate (Exercise) 95 bpm    100 bpm   Heart Rate (Exit) 71 bpm    83 bpm   Oxygen Saturation (Admit) 100 %    99 %   Oxygen Saturation (Exercise) 93 %    91 %   Oxygen Saturation (Exit) 99 %    99 %   Rating of Perceived Exertion (Exercise) 11    12   Perceived Dyspnea (Exercise) 2    3   Symptoms None   none Nonw   Comments First day of exercise! Patient was oriented to the gym and the equipment functions and settings. Procedures and policies of the gym were outlined and explained. The patient's individual exercise prescription and treatment plan were reviewed with them. All starting workloads were established based on the results of the functional testing  done at the initial intake visit. The plan for exercise progression was also introduced and progression will be customized based on the patient's performance and goals.    Increases in exercise prescription were reviewed with the patient. He tolerated the increase with no signs and  symptoms.  Amjad is progressing well in the program and can consistently exercise for the enite 45 minutes of aerobic exericse. We will focus progression efforts on intensity as the patient is able to tolerate them.    Duration Progress to 30 minutes of continuous aerobic without signs/symptoms of physical distress   Progress to 30 minutes of continuous aerobic without signs/symptoms of physical distress Progress to 30 minutes of continuous aerobic without signs/symptoms of physical distress   Intensity Rest + 30   Rest + 30 Rest + 30   Progression   Progression Continue progressive overload as per policy without signs/symptoms or physical distress.   Continue progressive overload as per policy without signs/symptoms or physical distress. Continue progressive overload as per policy without signs/symptoms or physical distress.   Resistance Training   Training Prescription (read-only) Yes  Yes Yes Yes   Weight (read-only) '2  4 4 4   '$ Reps (read-only) 10-15  10-15 10-15  10-15   Treadmill   MPH (read-only) 2.4 2.8 2.8 2.8 2.8   Grade (read-only) 0 0 0 0 0   Minutes (read-only) '10 10 15 15 15   '$ NuStep   Level (read-only) '2 3 3 3 3   '$ Watts (read-only) 40 40 40 40 40   Minutes (read-only) '10 10 15 15 15   '$ Recumbant Elliptical   Level (read-only) '3   4 4   '$ RPM (read-only)    35 35   Watts (read-only) 50       Minutes (read-only) '10   15 15     '$ 05/12/15 1000 05/19/15 1000 05/23/15 1000 05/26/15 0646 07/07/15 1100   Exercise Review   Progression Yes Yes Yes  Yes   Response to Exercise   Blood Pressure (Admit)    124/68 mmHg 128/64 mmHg   Blood Pressure (Exercise)    138/60 mmHg 158/60 mmHg   Blood Pressure (Exit)    122/62 mmHg    Heart Rate (Admit)    68 bpm 74 bpm   Heart Rate (Exercise)    97 bpm 94 bpm   Heart Rate (Exit)    74 bpm    Oxygen Saturation (Admit)    97 % 93 %   Oxygen Saturation (Exercise)    96 % 93 %   Oxygen Saturation (Exit)    99 %    Rating of Perceived Exertion  (Exercise)    13 13   Perceived Dyspnea (Exercise)    3 3   Symptoms None None None None None   Comments Reviewed individualized exercise prescription and made increases per departmental policy. Exercise increases were discussed with the patient and they were able to perform the new work loads without issue (no signs or symptoms).  Reviewed individualized exercise prescription and made increases per departmental policy. Exercise increases were discussed with the patient and they were able to perform the new work loads without issue (no signs or symptoms).  Reviewed individualized exercise prescription and made increases per departmental policy. Exercise increases were discussed with the patient and they were able to perform the new work loads without issue (no signs or symptoms).      Duration Progress to 30 minutes of continuous aerobic without signs/symptoms of physical distress Progress to 30 minutes of continuous aerobic without signs/symptoms of physical distress Progress to 30 minutes of continuous aerobic without signs/symptoms of physical distress Progress to 30 minutes of continuous aerobic without signs/symptoms of physical distress Progress to 30 minutes of continuous aerobic without signs/symptoms of physical distress   Intensity Rest + 30 Rest + 30 Rest + 30 Rest + 30 Rest + 30   Progression   Progression Continue progressive overload as per policy without signs/symptoms or physical distress. Continue progressive overload as per policy without signs/symptoms or physical distress. Continue progressive overload as per policy without signs/symptoms or physical distress. Continue progressive overload as per policy without signs/symptoms or physical distress. Continue progressive overload as per policy without signs/symptoms or physical distress.   Resistance Training   Training Prescription     Yes   Weight     4   Reps     10-15   Training Prescription (read-only) Yes Yes      Weight  (read-only) 4 4      Reps (read-only) 10-15 10-15      Treadmill   MPH     2.6   Grade     0   Minutes  15   MPH (read-only) 2.8 2.8      Grade (read-only) 0 0      Minutes (read-only) 15 15      NuStep   Level     6   Watts     50   Minutes     15   Level (read-only) 4 5      Watts (read-only) 40 45      Minutes (read-only) 15 15      Recumbant Elliptical   Level     6   Watts     35   Minutes     15   Level (read-only) 4 4      RPM (read-only) 35 35      Minutes (read-only) 15 15      REL-XR   Level   6 6 --   Watts   35 35 --   Level (read-only) 3 3      Watts (read-only) 50 50      Minutes (read-only) 15 15        07/23/15 1500           Exercise Review   Progression Yes       Response to Exercise   Blood Pressure (Admit) 110/60 mmHg       Blood Pressure (Exercise) 148/66 mmHg       Blood Pressure (Exit) 104/52 mmHg       Heart Rate (Admit) 80 bpm       Heart Rate (Exercise) 100 bpm       Heart Rate (Exit) 82 bpm       Oxygen Saturation (Admit) 94 %       Oxygen Saturation (Exercise) 92 %       Oxygen Saturation (Exit) 96 %       Rating of Perceived Exertion (Exercise) 12       Perceived Dyspnea (Exercise) 2.5       Symptoms None       Duration Progress to 30 minutes of continuous aerobic without signs/symptoms of physical distress       Intensity Rest + 30       Progression   Progression Continue progressive overload as per policy without signs/symptoms or physical distress.       Resistance Training   Training Prescription Yes       Weight 4       Reps 10-15       Treadmill   MPH 2.6       Grade 0       Minutes 15       NuStep   Level 6       Watts 50       Minutes 15       Recumbant Elliptical   Level 6       Watts 35       Minutes 15       REL-XR   Level 6       Watts 35       Minutes 15          Exercise Comments:     Exercise Comments      06/06/15 1052           Exercise Comments Decreased on Mid 6MW test from intake  distance. Patient insisted doing the walk at 2L/C of O2 to match what he did at the beginning, but he was advised to use 3-6L/C of O2. This may  have led to a decrease in distance          Discharge Exercise Prescription (Final Exercise Prescription Changes):     Exercise Prescription Changes - 07/23/15 1500    Exercise Review   Progression Yes   Response to Exercise   Blood Pressure (Admit) 110/60 mmHg   Blood Pressure (Exercise) 148/66 mmHg   Blood Pressure (Exit) 104/52 mmHg   Heart Rate (Admit) 80 bpm   Heart Rate (Exercise) 100 bpm   Heart Rate (Exit) 82 bpm   Oxygen Saturation (Admit) 94 %   Oxygen Saturation (Exercise) 92 %   Oxygen Saturation (Exit) 96 %   Rating of Perceived Exertion (Exercise) 12   Perceived Dyspnea (Exercise) 2.5   Symptoms None   Duration Progress to 30 minutes of continuous aerobic without signs/symptoms of physical distress   Intensity Rest + 30   Progression   Progression Continue progressive overload as per policy without signs/symptoms or physical distress.   Resistance Training   Training Prescription Yes   Weight 4   Reps 10-15   Treadmill   MPH 2.6   Grade 0   Minutes 15   NuStep   Level 6   Watts 50   Minutes 15   Recumbant Elliptical   Level 6   Watts 35   Minutes 15   REL-XR   Level 6   Watts 35   Minutes 15       Nutrition:  Target Goals: Understanding of nutrition guidelines, daily intake of sodium '1500mg'$ , cholesterol '200mg'$ , calories 30% from fat and 7% or less from saturated fats, daily to have 5 or more servings of fruits and vegetables.  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 He has recently lost about 10lbs by cutting his portion amounts. He does have a garden and eats a lot of vegetables. He drinks a mixture of juice and water and drink  8-9 glasses a day.      Nutrition Discharge: Rate Your Plate Scores:   Psychosocial: Target Goals: Acknowledge presence or absence of depression, maximize coping skills, provide positive support system. Participant is able to verbalize types and ability to use techniques and skills needed for reducing stress and depression.  Initial Review & Psychosocial Screening:     Initial Psych Review & Screening - 04/22/15 1300    Family Dynamics   Good Support System? 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 He is concerned with this new diagnosis of Pulmonary Fibrosis and will know more when he attends his [facility] appointment with the pulmonologist who specializes in Interstitual diseases.   Barriers   Psychosocial barriers to participate in program The patient should benefit from training in stress management and relaxation.   Screening Interventions   Interventions Encouraged to exercise;Program counselor consult      Quality of Life Scores:     Quality of Life - 04/22/15 1300    Quality of Life  Scores   Health/Function Pre 17.38 %   Socioeconomic Pre 23.57 %   Psych/Spiritual Pre 22.5 %   Family Pre 24.4 %   GLOBAL Pre 20.64 %      PHQ-9:     Recent Review Flowsheet Data    Depression screen [facility] 2/9 04/22/2015 10/16/2014   Decreased Interest 0 0   Down, Depressed, Hopeless 0 0   PHQ - 2 Score 0 0   Altered sleeping 0 -   Change in appetite 0 -   Feeling bad or failure about yourself  0 -   Trouble concentrating 0 -   Moving slowly or fidgety/restless 3 -   Suicidal thoughts 0 -   PHQ-9 Score 3 -   Difficult doing work/chores Somewhat difficult -      Psychosocial Evaluation and Intervention:     Psychosocial Evaluation - 05/05/15  1116    Psychosocial Evaluation & Interventions   Interventions Encouraged to exercise with the program and follow exercise prescription   Comments Counselor met with Mr. Rogue Jury today for initial psychosocial evaluation.  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  He has a strong support system with a spouse of 82 years and close friends.   He is also actively involved in his local faith community.  Mr. Jerilynn Mages reports that he sleeps well typically and has a good appetite.  He is generally in a positive mood and denies a history or current symptoms of depression or anxiety.  Mr. Rogue Jury states that his health is his primary stressor currently.  He has goals to get off of his Oxygen as much and increase his stamina and strength while in this program.  He plans to purchase some home equipment to help exercise more consistently.  Counselor will continue to follow with Mr. Rogue Jury.    Continued Psychosocial Services Needed Yes  Consistent exercise is recommended and Mr. M will benefit from the psychoeducational components of this program.        Psychosocial Re-Evaluation:     Psychosocial Re-Evaluation      07/02/15 1050 07/23/15 1117         Psychosocial Re-Evaluation   Comments Counselor follow up with Mr. Uppal reporting he has been out of class for awhile due to the birth of a new granddaughter in the [place] area.  He states he is sleeping well and his appetite is good.  He denies current symptoms of depression or anxiety and states his mood is generally positive most of the time.  Mr. Callaway reports he has seen just a little improvement in his breathing since beginning this program.  He also reports a change in medications several weeks ago to help with this, but has not noticed any improvement to date.  Mr. Pfluger still plans to purchase some home gym equipment to continue consistent exercise once this program is completed.   Mr. Fidel met  with counselor today for a follow up reporting he has seen some positive benefits recently as a result of this program.  He states he has returned to some of his normal activities such as driving and gardening.  He states his mood has improved and he is breathing better; as well as sleeping well.  Counselor commended Mr. Keats on his hard work and the progress that he has made.          Education: Education Goals: Education classes will be provided on a weekly  basis, covering required topics. Participant will state understanding/return demonstration of topics presented.  Learning Barriers/Preferences:     Learning Barriers/Preferences - 04/22/15 1300    Learning Barriers/Preferences   Learning Barriers None   Learning Preferences Group Instruction;Individual Instruction;Pictoral;Skilled Demonstration;Verbal Instruction;Written Material      Education Topics: Initial Evaluation Education: - Verbal, written and demonstration of respiratory meds, RPE/PD scales, oximetry and breathing techniques. Instruction on use of nebulizers and MDIs: cleaning and proper use, rinsing mouth with steroid doses and importance of monitoring MDI activations.          Pulmonary Rehab from 07/28/2015 in [facility] Cardiac and Pulmonary Rehab   Date  04/22/15   Educator  LB   Instruction Review Code  2- meets goals/outcomes      General Nutrition Guidelines/Fats and Fiber: -Group instruction provided by verbal, written material, models and posters to present the general guidelines for heart healthy nutrition. Gives an explanation and review of dietary fats and fiber.      Pulmonary Rehab from 07/28/2015 in [facility] Cardiac and Pulmonary Rehab   Date  04/28/15   Educator  CE   Instruction Review Code  2- meets goals/outcomes      Controlling Sodium/Reading Food Labels: -Group verbal and written material supporting the discussion of sodium use in heart healthy nutrition. Review and explanation with models, verbal and  written materials for utilization of the food label.      Pulmonary Rehab from 07/28/2015 in [facility] Cardiac and Pulmonary Rehab   Date  05/12/15   Educator  CR   Instruction Review Code  2- meets goals/outcomes      Exercise Physiology & Risk Factors: - Group verbal and written instruction with models to review the exercise physiology of the cardiovascular system and associated critical values. Details cardiovascular disease risk factors and the goals associated with each risk factor.   Aerobic Exercise & Resistance Training: - Gives group verbal and written discussion on the health impact of inactivity. On the components of aerobic and resistive training programs and the benefits of this training and how to safely progress through these programs.   Flexibility, Balance, General Exercise Guidelines: - Provides group verbal and written instruction on the benefits of flexibility and balance training programs. Provides general exercise guidelines with specific guidelines to those with heart or lung disease. Demonstration and skill practice provided.      Pulmonary Rehab from 07/28/2015 in [facility] Cardiac and Pulmonary Rehab   Date  05/07/15   Educator  RM   Instruction Review Code  2- meets goals/outcomes      Stress Management: - Provides group verbal and written instruction about the health risks of elevated stress, cause of high stress, and healthy ways to reduce stress.      Pulmonary Rehab from 07/28/2015 in [facility] Cardiac and Pulmonary Rehab   Date  05/14/15   Educator  [facility]   Instruction Review Code  2- meets goals/outcomes      Depression: - Provides group verbal and written instruction on the correlation between heart/lung disease and depressed mood, treatment options, and the stigmas associated with seeking treatment.   Exercise & Equipment Safety: - Individual verbal instruction and demonstration of equipment use and safety with use of the equipment.      Pulmonary Rehab from  07/28/2015 in [facility] Cardiac and Pulmonary Rehab   Date  04/25/15   Educator  SJ   Instruction Review Code  2- meets goals/outcomes      Infection Prevention: - Provides  verbal and written material to individual with discussion of infection control including proper hand washing and proper equipment cleaning during exercise session.      Pulmonary Rehab from 07/28/2015 in [facility] Cardiac and Pulmonary Rehab   Date  04/25/15   Educator  [place]   Instruction Review Code  2- meets goals/outcomes      Falls Prevention: - Provides verbal and written material to individual with discussion of falls prevention and safety.      Pulmonary Rehab from 07/28/2015 in [facility] Cardiac and Pulmonary Rehab   Date  04/22/15   Educator  LB   Instruction Review Code  2- meets goals/outcomes      Diabetes: - Individual verbal and written instruction to review signs/symptoms of diabetes, desired ranges of glucose level fasting, after meals and with exercise. Advice that pre and post exercise glucose checks will be done for 3 sessions at entry of program.   Chronic Lung Diseases: - Group verbal and written instruction to review new updates, new respiratory medications, new advancements in procedures and treatments. Provide informative websites and "800" numbers of self-education.      Pulmonary Rehab from 07/28/2015 in [facility] Cardiac and Pulmonary Rehab   Date  07/28/15   Educator  LB   Instruction Review Code  2- meets goals/outcomes      Lung Procedures: - Group verbal and written instruction to describe testing methods done to diagnose lung disease. Review the outcome of test results. Describe the treatment choices: Pulmonary Function Tests, ABGs and oximetry.      Pulmonary Rehab from 07/28/2015 in [facility] Cardiac and Pulmonary Rehab   Date  05/02/15   Educator  Cl[place]   Instruction Review Code  2- meets goals/outcomes      Energy Conservation: - Provide group verbal and written instruction for methods to conserve  energy, plan and organize activities. Instruct on pacing techniques, use of adaptive equipment and posture/positioning to relieve shortness of breath.   Triggers: - Group verbal and written instruction to review types of environmental controls: home humidity, furnaces, filters, dust mite/pet prevention, HEPA vacuums. To discuss weather changes, air quality and the benefits of nasal washing.   Exacerbations: - Group verbal and written instruction to provide: warning signs, infection symptoms, calling MD promptly, preventive modes, and value of vaccinations. Review: effective airway clearance, coughing and/or vibration techniques. Create an Sports administrator.   Oxygen: - Individual and group verbal and written instruction on oxygen therapy. Includes supplement oxygen, available portable oxygen systems, continuous and intermittent flow rates, oxygen safety, concentrators, and Medicare reimbursement for oxygen.      Pulmonary Rehab from 07/28/2015 in [facility] Cardiac and Pulmonary Rehab   Date  04/22/15   Educator  LB   Instruction Review Code  2- meets goals/outcomes      Respiratory Medications: - Group verbal and written instruction to review medications for lung disease. Drug class, frequency, complications, importance of spacers, rinsing mouth after steroid MDI's, and proper cleaning methods for nebulizers.      Pulmonary Rehab from 07/28/2015 in [facility] Cardiac and Pulmonary Rehab   Date  04/22/15   Educator  LB   Instruction Review Code  2- meets goals/outcomes      AED/CPR: - Group verbal and written instruction with the use of models to demonstrate the basic use of the AED with the basic ABC's of resuscitation.      Pulmonary Rehab from 07/28/2015 in [facility] Cardiac and Pulmonary Rehab   Date  05/23/15   Educator  CE   Instruction Review Code  2- meets goals/outcomes      Breathing Retraining: - Provides individuals verbal and written instruction on purpose, frequency, and proper technique of  diaphragmatic breathing and pursed-lipped breathing. Applies individual practice skills.      Pulmonary Rehab from 07/28/2015 in [facility] Cardiac and Pulmonary Rehab   Date  04/22/15   Educator  SJ   Instruction Review Code  2- meets goals/outcomes      Anatomy and Physiology of the Lungs: - Group verbal and written instruction with the use of models to provide basic lung anatomy and physiology related to function, structure and complications of lung disease.   Heart Failure: - Group verbal and written instruction on the basics of heart failure: signs/symptoms, treatments, explanation of ejection fraction, enlarged heart and cardiomyopathy.   Sleep Apnea: - Individual verbal and written instruction to review Obstructive Sleep Apnea. Review of risk factors, methods for diagnosing and types of masks and machines for OSA.      Pulmonary Rehab from 07/28/2015 in [facility] Cardiac and Pulmonary Rehab   Date  04/22/15   Educator  LB   Instruction Review Code  2- meets goals/outcomes      Anxiety: - Provides group, verbal and written instruction on the correlation between heart/lung disease and anxiety, treatment options, and management of anxiety.   Relaxation: - Provides group, verbal and written instruction about the benefits of relaxation for patients with heart/lung disease. Also provides patients with examples of relaxation techniques.   Knowledge Questionnaire Score:     Knowledge Questionnaire Score - 04/22/15 1300    Knowledge Questionnaire Score   Pre Score 0/10       Core Components/Risk Factors/Patient Goals at Admission:     Personal Goals and Risk Factors at Admission - 04/22/15 1300    Core Components/Risk Factors/Patient Goals on Admission    Weight Management Yes   Intervention (read-only) Learn and follow the exercise and diet guidelines while in the program. 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 He eats a lot of vegetables and has lowered his portion amounts. He drinks 8-9 glasses of water and juice a day.   Admit Weight 164 lb 8 oz (74.617 kg)   Goal Weight: Short Term 160 lb (72.576 kg)   Sedentary Yes   Intervention (read-only) While in program, learn and follow the exercise prescription taught. Start at a low level workload and increase workload after able to maintain previous level for 30 minutes. Increase time before increasing intensity.  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 He is considering a TM or EL for home use.   Improve shortness of breath with ADL's Yes  shortness of breath. He will benefit with monitoring his O2Sats and pacing.   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 Family and friends noticed it, but Dr Emilio Math  did not  address the issue. Now that he has been diagonised with Pulmonary fibrosis, he can look back and see his increased    Develop more efficient breathing techniques such as purse lipped breathing and diaphragmatic breathing; and practicing self-pacing with activity Yes   Intervention (read-only) While in program, learn and utilize the specific breathing techniques taught to you. Continue to practice and use the techniques as needed.  Instructed Dr Clydie Braun on PLB and its benefit with shortness of breath with activity.   Increase knowledge of respiratory medications and ability to use respiratory devices properly  Yes   Intervention (read-only) While in program learn and demonstrate appropriate use of your oxygen therapy by increasing flow with exertion, manage oxygen tank operation, including continuous and intermittent flow.  Understanding oxygen is a drug ordered by your physician.;While in program, learn to administer MDI, nebulizer, and spacer properly.;Learn to take respiratory medicine as  ordered.;While in program, learn to Clean MDI, nebulizers, and spacers properly.  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  He has oxygen: 2l/m rest and 3-4l/m activity. 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 Dr Clydie Braun wears a full face mask with his CPAP.   Understand more about Heart/Pulmonary Disease. Yes   Intervention While in program utilize professionals for any questions, and attend the education sessions. Great websites to use are www.americanheart.org or www.lung.org for reliable information.  Dr Clydie Braun was recently diagnosed with Pulmonary Fibrosis. He has been referred to [facility] to see a specialist and is very interested in learning more about his disease.      Core Components/Risk Factors/Patient Goals Review:      Goals and Risk Factor Review      04/22/15 1300 04/25/15 1507 04/28/15 1000 04/28/15 1056 05/02/15 1415   Core Components/Risk Factors/Patient Goals Review   Personal Goals Review Improve shortness of breath with ADL's Develop more efficient breathing techniques such as purse lipped breathing and diaphragmatic breathing and practicing self-pacing with activity. Understand more about Heart/Pulmonary Disease Increase Aerobic Exercise and Physical Activity    Increase Aerobic Exercise and Physical Activity (read-only)   Comments    Amjad is on his second day of exercise in Pulmonary Rehab. We discussed his goals of wanting to be able to get back to doing yard work, gardening, and walking which are activities he enjoys. He stated that there is an incline on his driveway and several steps that he needs to climb to do these activities. We discussed how his exerciser routine can hopefully allow him to be able to do more without getting as short of breath, and eventurally get back to doing these ADL's.    Understand more about Heart/Pulmonary Disease (read-only)   Goals Progress/Improvement seen    Yes     Comments   Gave Mr Mankins a  copy of the Pulmonary Fibrosis Guide for Patients from the PF Association which has great educational material.     Improve shortness of breath with ADL's (read-only)   Goals Progress/Improvement seen      Yes   Comments     Mr. Ropp says that he notices less shorness of breath when walking down his driveway.  He wants to try it without his O2 but he was advised against it at this time.   Breathing Techniques (read-only)   Goals Progress/Improvement seen   Yes Yes  Yes   Comments  Taught PLB today. Encouraged Dr Clydie Braun to use PLB on the treadmill; it is helpful to him with his shortness of breath       05/19/15 1542 05/27/15 0852 06/04/15 1036 06/04/15 1459 06/11/15 1521   Core Components/Risk Factors/Patient Goals Review   Personal Goals Review Improve shortness of breath with ADL's Sedentary Other Improve shortness of breath with ADL's;Develop more efficient breathing techniques such as purse lipped breathing and diaphragmatic breathing and practicing self-pacing with activity. Increase  knowledge of respiratory medications and ability to use respiratory devices properly.   Review Mr Rogue Jury had increased shortness of breath on the treadmill today. He had a big breakfast at 9:00am and states that could have increased his shortness of breath and lowered his O2Sat's.  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  Increasing activity [name] has missed a few sessions and was discouraged about his weight and O2 saturation. He is determined to not lose his conditioning and the progress he has made. 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 I gave him a copy of the IPF Foundation publication. Dr Emilio Math starts on [place] tomorrow and discussed the benefits of slowing the progression of his IPF and hepl with his shortness of breath. He is using PLB and is adjusting his oxygen flow rates with activity. Dr Emilio Math will start  on his 2 pills/3 times a day of the [place]. He has tolerated the drug well.    Expected Outcomes Independence with adjusting oxygen with increased activity. [name] is remaining active outside of class, but the current exercise regimen is helping him strengthen.  He should be able to progress on all equipment within next two weeks pending regular attendance. [name] will resume his previous workloads but with regular attendance and with him doing his home exercises he is projected to make exercise increases and progression by early next week.  Be able to tolerate Esbriet and see improvement in his lung function; continue adjusting oxygen with activity with exercise and activites at home. Ability to take 3 pills/ 3 times a day of the Esbriet without symptoms.     06/16/15 1000 07/23/15 1000         Core Components/Risk Factors/Patient Goals Review   Personal Goals Review Increase knowledge of respiratory medications and ability to use respiratory devices properly. Sedentary;Increase Strength and Stamina;Improve shortness of breath with ADL's;Develop more efficient breathing techniques such as purse lipped breathing and diaphragmatic breathing and practicing self-pacing with activity.;Increase knowledge of respiratory medications and ability to use respiratory devices properly.      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 He will start the 3 pill dosage soon. He was concerned with higher heart rate episodes, but his cardiologist, Dr Fletcher Anon, examined him and was not concerned . He did not want to order a heart monitor at this time. 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 He had a recent appointment with his [facility] physician, Dr Ethel Rana who encouraged him to be act with his IPF. Dr Emilio Math is purchasing a TM and is interested in Independent FF through [place]. He uses his MDI 's and PLB appropriately . His physician may  consider starting him on the second IPF drug, Nintoderib.  Dr Emilio Math is also doing some gardening at home and managing his shortness of breath with these activies with PLB and pacing.      Expected Outcomes Continue with Esbriet symtom free. Continue managing his IPF daily through exercise and his medications.         Core Components/Risk Factors/Patient Goals at Discharge (Final Review):      Goals and Risk Factor Review - 07/23/15 1000    Core Components/Risk Factors/Patient Goals Review   Personal Goals Review Sedentary;Increase Strength and Stamina;Improve shortness of breath with ADL's;Develop more efficient breathing techniques  such as purse lipped breathing and diaphragmatic breathing and practicing self-pacing with activity.;Increase knowledge of respiratory medications and ability to use respiratory devices properly.   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 He had a recent appointment with his [facility] physician, Dr Ethel Rana who encouraged him to be act with his IPF. Dr Emilio Math is purchasing a TM and is interested in Independent FF through [place]. He uses his MDI 's and PLB appropriately . His physician may consider starting him on the second IPF drug, Nintoderib.  Dr Emilio Math is also doing some gardening at home and managing his shortness of breath with these activies with PLB and pacing.   Expected Outcomes Continue managing his IPF daily through exercise and his medications.      ITP Comments:     ITP Comments      04/25/15 1020 04/25/15 1514 04/28/15 1019 04/30/15 1114 05/02/15 1037   ITP Comments Personal and exercise goals expected to be met in 34 more sessions. Progress on specific individualized goals will be charted in patient's ITP. Upon completion of the program the patient will be comfortable managing exercise goals and progression on their own.  Spoke with Amjad today about setting goals and working on the goals while  in the program. Will review nutriton goals  and set short term and long term goals after he has met with the RD.  Personal and exercise goals expected to be met in 33 more sessions. Progress on specific individualized goals will be charted in patient's ITP. Upon completion of the program the patient will be comfortable managing exercise goals and progression on their own.  Personal and exercise goals expected to be met in 32 more sessions. Progress on specific individualized goals will be charted in patient's ITP. Upon completion of the program the patient will be comfortable managing exercise goals and progression on their own.  Personal and exercise goals expected to be met in 31 more sessions. Progress on specific individualized goals will be charted in patient's ITP. Upon completion of the program the patient will be comfortable managing exercise goals and progression on their own.      05/02/15 1539 05/05/15 1033 05/07/15 1058 05/09/15 1054 05/12/15 1027   ITP Comments Mr. Cone will meet with his Pulmonologist at [facility] for the first time on February 21st. Exercise and personal goals anticipated to be met in 30 more sessions. See ITP for detailed report on goals.  Exercise and personal goals anticipated to be met in 29 more sessions. See ITP for detailed report on goals.  Personal and exercise goals expected to be met in 28 more sessions. Progress on specific individualized goals will be charted in patient's ITP. Upon completion of the program the patient will be comfortable managing exercise goals and progression on their own.  Personal and exercise goals expected to be met in 27 more sessions. Progress on specific individualized goals will be charted in patient's ITP. Upon completion of the program the patient will be comfortable managing exercise goals and progression on their own.      05/14/15 1055 05/16/15 1043 05/19/15 1051 05/23/15 1048 05/30/15 1028   ITP Comments Personal and exercise goals  expected to be met in 26 more sessions. Progress on specific individualized goals will be charted in patient's ITP. Upon completion of the program the patient will be comfortable managing exercise goals and progression on their own.  Personal and exercise goals expected to be met  in 25 more sessions. Progress on specific individualized goals will be charted in patient's ITP. Upon completion of the program the patient will be comfortable managing exercise goals and progression on their own.  Personal and exercise goals expected to be met in 24 more sessiosns. See ITP for specific notes no goal progression. Personal and exercise goals expected to be met in 23 more sessions. See ITP for specific notes on goal progression.  Personal and exercise goals expected to be met in 20 more sessions. Progress on specific individualized goals will be charted in patient's ITP. Upon completion of the program the patient will be comfortable managing exercise goals and progression on their own.      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 Has been out for family reasons. Retruned today, no voiced concerns         Comments: 30 Day Review

## 2015-08-22 ENCOUNTER — Encounter: Payer: Medicare Other | Attending: Internal Medicine | Admitting: Respiratory Therapy

## 2015-08-22 DIAGNOSIS — J8410 Pulmonary fibrosis, unspecified: Secondary | ICD-10-CM | POA: Insufficient documentation

## 2015-08-22 NOTE — Progress Notes (Signed)
Daily Session Note  Patient Details  Name: Vincent Kennedy MRN: [id number] Date of Birth: 16-Dec-1932 Referring Provider:    Encounter Date: 08/22/2015  Check In:     Session Check In - 08/22/15 1041    Check-In   Location [facility]-Cardiac & Pulmonary Rehab   Staff Present Heath Lark, RN, BSN, CCRP;Carroll Enterkin, RN, Building control surveyor, RRT, RCP, Respiratory Lennie Hummer, MA, ACSM RCEP, Exercise Physiologist   Supervising physician immediately available to respond to emergencies [name] immediately available ER MD   Physician(s) Dr. Edd Fabian and Darlys Gales   Medication changes reported     No   Fall or balance concerns reported    No   Warm-up and Cool-down Performed on first and last piece of equipment   Resistance Training Performed Yes   VAD Patient? No   Pain Assessment   Currently in Pain? No/denies         Goals Met:  Proper associated with RPD/PD & O2 Sat Independence with exercise equipment Exercise tolerated well Strength training completed today  Goals Unmet:  Not Applicable  Comments: Pt able to follow exercise prescription today without complaint.  Will continue to monitor for progression.   Dr. Emily Filbert is Medical Director for [place] and [name].

## 2015-08-25 ENCOUNTER — Encounter: Payer: Medicare Other | Admitting: *Deleted

## 2015-08-25 DIAGNOSIS — J8410 Pulmonary fibrosis, unspecified: Secondary | ICD-10-CM | POA: Diagnosis not present

## 2015-08-25 NOTE — Progress Notes (Signed)
Daily Session Note  Patient Details  Name: Vincent Kennedy MRN: [id number] Date of Birth: 09-Dec-1932 Referring Provider:    Encounter Date: 08/25/2015  Check In:     Session Check In - 08/25/15 1121    Check-In   Location [facility]-Cardiac & Pulmonary Rehab   Staff Present Alberteen Sam, MA, ACSM RCEP, Exercise Physiologist;Rodderick Holtzer Amedeo Plenty, BS, ACSM CEP, Exercise Physiologist;Laureen Owens Shark, BS, RRT, Respiratory Therapist   Supervising physician immediately available to respond to emergencies [name] immediately available ER MD   Physician(s) Cinda Quest and [place]   Medication changes reported     No   Fall or balance concerns reported    No   Warm-up and Cool-down Performed on first and last piece of equipment   Resistance Training Performed No   VAD Patient? No   Pain Assessment   Currently in Pain? No/denies   Multiple Pain Sites No         Goals Met:  Proper associated with RPD/PD & O2 Sat Independence with exercise equipment Exercise tolerated well Personal goals reviewed Completed 6 minute walk  Goals Unmet:  Not Applicable  Comments: Patient completed exercise prescription and all exercise goals during rehab session. The exercise was tolerated well and the patient is progressing in the program.       6 Minute Walk      04/22/15 1634 06/06/15 1053 08/25/15 1157   6 Minute Walk   Phase Initial Mid Program Discharge   Distance 1460 feet 1230 feet 1175 feet   Distance % Change  -16 % -19.5 %   Walk Time 6 minutes 6 minutes 5.42 minutes   # of Rest Breaks  0 1  desaturated to 80% 35 sec   MPH   2.46   METS   2.33   RPE '13 15 13   '$ Perceived Dyspnea  '2 4 3   '$ VO2 Peak   8.2   Symptoms No No Yes (comment)   Comments   Had a hard time steering tank.  SaO2 dropped to 80% during test and took about 30-35 seconds to recover to 88%, pt did not feel that it was that low.   Resting HR 65 bpm 67 bpm 77 bpm   Resting BP 126/68 mmHg 130/62 mmHg 128/64 mmHg   Max Ex. HR 98  bpm 90 bpm 113 bpm   Max Ex. BP 146/78 mmHg 166/78 mmHg 128/64 mmHg   Interval HR   Baseline HR   77   2 Minute HR   89   3 Minute HR   106   4 Minute HR   113   5 Minute HR   110   6 Minute HR   112   2 Minute Post HR   76   Interval Heart Rate?   Yes   Interval Oxygen   Baseline Oxygen Saturation %   94 %   Baseline Liters of Oxygen   4 L   2 Minute Oxygen Saturation %   94 %   2 Minute Liters of Oxygen   4 L   3 Minute Oxygen Saturation %   89 %   3 Minute Liters of Oxygen   4 L   4 Minute Oxygen Saturation %   89 %  low 80% at 440   4 Minute Liters of Oxygen   4 L   5 Minute Oxygen Saturation %   86 %   5 Minute Liters of Oxygen   4 L   6  Minute Oxygen Saturation %   87 %   6 Minute Liters of Oxygen   4 L   2 Minute Post Oxygen Saturation %   97 %   2 Minute Post Liters of Oxygen   4 L        Dr. Emily Filbert is Medical Director for [place] and [name].

## 2015-08-26 ENCOUNTER — Ambulatory Visit (INDEPENDENT_AMBULATORY_CARE_PROVIDER_SITE_OTHER): Payer: Medicare Other | Admitting: Family Medicine

## 2015-08-26 ENCOUNTER — Encounter: Payer: Self-pay | Admitting: Family Medicine

## 2015-08-26 ENCOUNTER — Other Ambulatory Visit: Payer: Self-pay

## 2015-08-26 VITALS — BP 122/74 | HR 74 | Temp 97.9°F | Resp 16 | Wt 160.5 lb

## 2015-08-26 DIAGNOSIS — Z5181 Encounter for therapeutic drug level monitoring: Secondary | ICD-10-CM | POA: Diagnosis not present

## 2015-08-26 DIAGNOSIS — J8410 Pulmonary fibrosis, unspecified: Secondary | ICD-10-CM | POA: Diagnosis not present

## 2015-08-26 DIAGNOSIS — I10 Essential (primary) hypertension: Secondary | ICD-10-CM

## 2015-08-26 DIAGNOSIS — I2510 Atherosclerotic heart disease of native coronary artery without angina pectoris: Secondary | ICD-10-CM

## 2015-08-26 DIAGNOSIS — E785 Hyperlipidemia, unspecified: Secondary | ICD-10-CM

## 2015-08-26 HISTORY — DX: Pulmonary fibrosis, unspecified: J84.10

## 2015-08-26 MED ORDER — RIVASTIGMINE 9.5 MG/24HR TD PT24: 9.5000 mg | MEDICATED_PATCH | Freq: Every day | TRANSDERMAL | Status: DC

## 2015-08-26 MED ORDER — PANTOPRAZOLE SODIUM 40 MG PO TBEC: 40.0000 mg | DELAYED_RELEASE_TABLET | Freq: Every day | ORAL | Status: DC | PRN

## 2015-08-26 NOTE — Patient Instructions (Signed)
  Try to follow the DASH guidelines (DASH stands for Dietary Approaches to Stop Hypertension) Try to limit the sodium in your diet.  Ideally, consume less than 1.5 grams (less than 1,500mg ) per day. Do not add salt when cooking or at the table.  Check the sodium amount on labels when shopping, and choose items lower in sodium when given a choice. Avoid or limit foods that already contain a lot of sodium. Eat a diet rich in fruits and vegetables and whole grains.  Try to limit saturated fats in your diet (bologna, hot dogs, barbeque, cheeseburgers, hamburgers, steak, bacon, sausage, cheese, etc.) and get more fresh fruits, vegetables, and whole grains  We'll get labs today

## 2015-08-26 NOTE — Progress Notes (Signed)
BP 122/74 mmHg  Pulse 74  Temp(Src) 97.9 F (36.6 C) (Oral)  Resp 16  Wt 160 lb 8 oz (72.802 kg)  SpO2 93%   Subjective:    Patient ID: Vincent Kennedy, male    DOB: 04/16/1932, 82 y.o.   MRN: [id number]  HPI: Vincent Kennedy is a 82 y.o. male  Chief Complaint  Patient presents with  . Follow-up  . Labs Only   He is new to me; his last provider left our practice  Ophthalmologist heard crackles and that prompted investigation; Dr. [name] checked him out On prednisone for one month, then tapered down and finally stopped; seeing Dr. Lauris Chroman Pulmonary fibroisis; does run in the family to his knowledge, sister had it and died in her early 1s Wearing oxygen now 24/7; he must have been in denial he says; little slope from street to the house; wife noticed that he was getting [facility] when walking last summer  Blood pressure today is well-controlled; on carvedilol 12.5 mg BID; checks randomly; he might add an extra 6.25 mg in the morning once or twice a week; Dr. Fletcher Anon; staying away from salt; no decongestants  High cholesterol; will check lipids today; on statin; hx of cardiac stent; no chest pain  I see [name] on his med list and asked about this; he might take 4-5 times a week, just 5 mg at a time, Dr. [name]   Depression screen [facility] 2/9 08/26/2015 04/22/2015 10/16/2014  Decreased Interest 0 0 0  Down, Depressed, Hopeless 0 0 0  PHQ - 2 Score 0 0 0  Altered sleeping - 0 -  Change in appetite - 0 -  Feeling bad or failure about yourself  - 0 -  Trouble concentrating - 0 -  Moving slowly or fidgety/restless - 3 -  Suicidal thoughts - 0 -  PHQ-9 Score - 3 -  Difficult doing work/chores - Somewhat difficult -   Relevant past medical, surgical, family and social history reviewed Past Medical History  Diagnosis Date  . Coronary artery disease     Cardiac cath September 2008: 20% left main stenosis, 40% mid LAD stenosis, 99% ostial first diagonal stenosis in a large branch and 90%  ostial disease in second diagonal but the vessel was very small, 20% mid RCA stenosis with normal ejection fraction. Successful angioplasty and drug-eluting stent placement to the ostial first diagonal with a 2.5 x 15 mm Xience stent  . Hyperlipidemia   . Abnormal nuclear cardiac imaging test     Dr. Fletcher Anon  . Cataract   . Sleep apnea     has not used CPAP recently.  Feels condition has improved.  . Hypertension     BP has never been high(per pt).  Metoprolol is to keep BP low because of stent (afterload)  . Shortness of breath dyspnea     with exertion  . Wears hearing aid     (sometimes)  . Oxygen deficiency   . Pulmonary fibrosis ([place]) 08/26/2015   Past Surgical History  Procedure Laterality Date  . Cardiac catheterization      2010  . Coronary angioplasty  2010  . Hernia repair Bilateral     inguinal  . Colonoscopy with esophagogastroduodenoscopy (egd)    . Cataract extraction w/phaco Right 01/29/2015    Procedure: CATARACT EXTRACTION PHACO AND INTRAOCULAR LENS PLACEMENT (IOC);  Surgeon: Leandrew Koyanagi, MD;  Location: [place];  Service: Ophthalmology;  Laterality: Right;  RESTOR LENS CPAP  . Cataract  extraction w/phaco Left 03/12/2015    Procedure: CATARACT EXTRACTION PHACO AND INTRAOCULAR LENS PLACEMENT (IOC);  Surgeon: Leandrew Koyanagi, MD;  Location: [place];  Service: Ophthalmology;  Laterality: Left;  RESTOR SHUGARCAINE  . Eye surgery      cataract   Family History  Problem Relation Age of Onset  . CAD Father   . CAD Brother    Social History  Substance Use Topics  . Smoking status: Never Smoker   . Smokeless tobacco: None  . Alcohol Use: No   Interim medical history since last visit reviewed. Allergies and medications reviewed  Review of Systems Per HPI unless specifically indicated above     Objective:    BP 122/74 mmHg  Pulse 74  Temp(Src) 97.9 F (36.6 C) (Oral)  Resp 16  Wt 160 lb 8 oz (72.802 kg)  SpO2 93%  Wt  Readings from Last 3 Encounters:  08/26/15 160 lb 8 oz (72.802 kg)  04/22/15 164 lb 8 oz (74.617 kg)  03/20/15 156 lb (70.761 kg)    Physical Exam  Constitutional: He appears well-developed and well-nourished. No distress.  HENT:  Head: Normocephalic and atraumatic.  Wearing supp O2 via [place]  Eyes: EOM are normal. No scleral icterus.  Neck: No thyromegaly present.  Cardiovascular: Normal rate and regular rhythm.   Pulmonary/Chest: Effort normal and breath sounds normal.  Abdominal: Soft. Bowel sounds are normal. He exhibits no distension.  Musculoskeletal: He exhibits no edema.  Neurological: Coordination normal.  Skin: Skin is warm and dry. No pallor.  Psychiatric: He has a normal mood and affect.   Results for orders placed or performed during the hospital encounter of 03/20/15  CBC  Result Value Ref Range   WBC 7.4 3.8 - 10.6 K/uL   RBC 4.64 4.40 - 5.90 MIL/uL   Hemoglobin 13.5 13.0 - 18.0 g/dL   HCT 40.2 40.0 - 52.0 %   MCV 86.5 80.0 - 100.0 fL   MCH 29.0 26.0 - 34.0 pg   MCHC 33.5 32.0 - 36.0 g/dL   RDW 14.2 11.5 - 14.5 %   Platelets 225 150 - 440 K/uL  Comprehensive metabolic panel  Result Value Ref Range   Sodium 138 135 - 145 mmol/L   Potassium 3.8 3.5 - 5.1 mmol/L   Chloride 106 101 - 111 mmol/L   CO2 28 22 - 32 mmol/L   Glucose, Bld 98 65 - 99 mg/dL   BUN 16 6 - 20 mg/dL   Creatinine, Ser 0.81 0.61 - 1.24 mg/dL   Calcium 9.0 8.9 - 10.3 mg/dL   Total Protein 7.7 6.5 - 8.1 g/dL   Albumin 3.5 3.5 - 5.0 g/dL   AST 23 15 - 41 U/L   ALT 14 (L) 17 - 63 U/L   Alkaline Phosphatase 62 38 - 126 U/L   Total Bilirubin 0.9 0.3 - 1.2 mg/dL   GFR calc non Af Amer >60 >60 mL/min   GFR calc Af Amer >60 >60 mL/min   Anion gap 4 (L) 5 - 15  APTT  Result Value Ref Range   aPTT 29 24 - 36 seconds  Protime-INR  Result Value Ref Range   Prothrombin Time 14.3 11.4 - 15.0 seconds   INR 1.09   ANA w/Reflex if Positive  Result Value Ref Range   Anit Nuclear Antibody(ANA)  Positive (A) Negative  ANCA titers  Result Value Ref Range   C-ANCA <1:20 Neg:<1:20 titer   P-ANCA <1:20 Neg:<1:20 titer   Atypical P-ANCA  titer <1:20 Neg:<1:20 titer  Sedimentation rate  Result Value Ref Range   Sed Rate 44 (H) 0 - 20 mm/hr  ANA Comprehensive Panel  Result Value Ref Range   ds DNA Ab 21 (H) 0 - 9 IU/mL   Ribonucleic Protein 0.2 0.0 - 0.9 AI   ENA SM Ab Ser-aCnc <0.2 0.0 - 0.9 AI   Scleroderma (Scl-70) (ENA) Antibody, IgG 1.6 (H) 0.0 - 0.9 AI   SSA (Ro) (ENA) Antibody, IgG <0.2 0.0 - 0.9 AI   SSB (La) (ENA) Antibody, IgG <0.2 0.0 - 0.9 AI   Chromatin Ab SerPl-aCnc <0.2 0.0 - 0.9 AI   Anti JO-1 <0.2 0.0 - 0.9 AI   Centromere Ab Screen <0.2 0.0 - 0.9 AI   See below: Comment   Glomerular basement membrane antibodies  Result Value Ref Range   GBM Ab 5 0 - 20 units  Blood gas, arterial  Result Value Ref Range   FIO2 0.21    Delivery systems ROOM AIR    pH, Arterial 7.44 7.350 - 7.450   pCO2 arterial 32 32.0 - 48.0 mmHg   pO2, Arterial 67 (L) 83.0 - 108.0 mmHg   Bicarbonate 21.7 21.0 - 28.0 mEq/L   Acid-base deficit 1.6 0.0 - 2.0 mmol/L   O2 Saturation 93.7 %   Patient temperature 37.0    Collection site LEFT RADIAL    Sample type ARTERIAL DRAW    Allens test (pass/fail) PASS PASS  Quantiferon tb gold assay (blood)  Result Value Ref Range   QUANTIFERON INCUBATION Comment   QuantiFERON In Tube  Result Value Ref Range   QUANTIFERON TB GOLD Indeterminate Negative   QUANTIFERON CRITERIA Comment    QUANTIFERON TB AG VALUE 0.07 IU/mL   Quantiferon Nil Value 0.03 IU/mL   QUANTIFERON MITOGEN VALUE 0.35 IU/mL   QFT TB AG MINUS NIL VALUE 0.04 IU/mL   Interpretation: Comment   ENA+DNA/DS+ANTICH+CENTRO+JO...  Result Value Ref Range   ds DNA Ab 21 (H) 0 - 9 IU/mL   Ribonucleic Protein 0.3 0.0 - 0.9 AI   ENA SM Ab Ser-aCnc <0.2 0.0 - 0.9 AI   Scleroderma (Scl-70) (ENA) Antibody, IgG 1.3 (H) 0.0 - 0.9 AI   SSA (Ro) (ENA) Antibody, IgG <0.2 0.0 - 0.9 AI   SSB (La)  (ENA) Antibody, IgG <0.2 0.0 - 0.9 AI   Chromatin Ab SerPl-aCnc <0.2 0.0 - 0.9 AI   Anti JO-1 <0.2 0.0 - 0.9 AI   Centromere Ab Screen <0.2 0.0 - 0.9 AI   See below: Comment       Assessment & Plan:   Problem List Items Addressed This Visit      Cardiovascular and Mediastinum   Coronary artery disease involving native coronary artery of native heart without angina pectoris    Managed by cardiologist; no chest pain      Relevant Medications   aspirin 81 MG chewable tablet   carvedilol (COREG) 12.5 MG tablet   Hypertension goal BP (blood pressure) < 150/90    Follow DASH guidelines      Relevant Medications   aspirin 81 MG chewable tablet   carvedilol (COREG) 12.5 MG tablet     Respiratory   Pulmonary fibrosis (HCC)    Managed by Dr. Glynda Jaeger; on supp O2 24/7; continue meds        Other   Hyperlipidemia - Primary    Check lipids today; avoid saturated fats      Relevant Medications   aspirin 81 MG  chewable tablet   carvedilol (COREG) 12.5 MG tablet   Other Relevant Orders   Lipid Panel w/o Chol/HDL Ratio   Medication monitoring encounter   Relevant Orders   CBC with Differential/Platelet   Comprehensive metabolic panel      Follow up plan: Return in about 4 months (around 12/26/2015) for follow-up with fasting labs.  An after-visit summary was printed and given to the patient at check-out.  Please see the patient instructions which may contain other information and recommendations beyond what is mentioned above in the assessment and plan.  Meds ordered this encounter  Medications  . aspirin 81 MG chewable tablet    Sig: Chew 81 mg by mouth daily.  . carvedilol (COREG) 12.5 MG tablet    Sig: Take 12.5 tablets by mouth daily.  . Pirfenidone (ESBRIET) 267 MG CAPS    Sig: Three pills by mouth three times a day (per pulmonologist)    Dispense:  1 capsule    Orders Placed This Encounter  Procedures  . CBC with Differential/Platelet  . Comprehensive metabolic  panel  . Lipid Panel w/o Chol/HDL Ratio

## 2015-08-26 NOTE — Assessment & Plan Note (Signed)
Follow DASH guidelines 

## 2015-08-26 NOTE — Assessment & Plan Note (Signed)
Managed by Dr. Glynda JaegerGilstrap; on supp O2 24/7; continue meds

## 2015-08-26 NOTE — Assessment & Plan Note (Signed)
Check lipids today; avoid saturated fats 

## 2015-08-26 NOTE — Assessment & Plan Note (Signed)
Managed by cardiologist; no chest pain

## 2015-08-26 NOTE — Telephone Encounter (Signed)
Pt states forgot to ask for refills?

## 2015-08-27 LAB — CBC WITH DIFFERENTIAL/PLATELET
BASOS: 1 %
Basophils Absolute: 0.1 10*3/uL (ref 0.0–0.2)
EOS (ABSOLUTE): 0.3 10*3/uL (ref 0.0–0.4)
EOS: 5 %
Hematocrit: 44.6 % (ref 37.5–51.0)
Hemoglobin: 14.6 g/dL (ref 12.6–17.7)
IMMATURE GRANULOCYTES: 0 %
Immature Grans (Abs): 0 10*3/uL (ref 0.0–0.1)
LYMPHS: 25 %
Lymphocytes Absolute: 1.7 10*3/uL (ref 0.7–3.1)
MCH: 29.6 pg (ref 26.6–33.0)
MCHC: 32.7 g/dL (ref 31.5–35.7)
MCV: 91 fL (ref 79–97)
MONOCYTES: 9 %
Monocytes Absolute: 0.6 10*3/uL (ref 0.1–0.9)
NEUTROS PCT: 60 %
Neutrophils Absolute: 3.9 10*3/uL (ref 1.4–7.0)
PLATELETS: 191 10*3/uL (ref 150–379)
RBC: 4.93 x10E6/uL (ref 4.14–5.80)
RDW: 14 % (ref 12.3–15.4)
WBC: 6.5 10*3/uL (ref 3.4–10.8)

## 2015-08-27 LAB — LIPID PANEL W/O CHOL/HDL RATIO
CHOLESTEROL TOTAL: 187 mg/dL (ref 100–199)
HDL: 34 mg/dL — ABNORMAL LOW (ref >39)
LDL Calculated: 131 mg/dL — ABNORMAL HIGH (ref 0–99)
TRIGLYCERIDES: 110 mg/dL (ref 0–149)
VLDL CHOLESTEROL CAL: 22 mg/dL (ref 5–40)

## 2015-08-27 LAB — COMPREHENSIVE METABOLIC PANEL
ALBUMIN: 3.9 g/dL (ref 3.5–4.7)
ALK PHOS: 70 IU/L (ref 39–117)
ALT: 9 IU/L (ref 0–44)
AST: 19 IU/L (ref 0–40)
Albumin/Globulin Ratio: 1 — ABNORMAL LOW (ref 1.2–2.2)
BILIRUBIN TOTAL: 0.4 mg/dL (ref 0.0–1.2)
BUN / CREAT RATIO: 15 (ref 10–24)
BUN: 14 mg/dL (ref 8–27)
CHLORIDE: 101 mmol/L (ref 96–106)
CO2: 25 mmol/L (ref 18–29)
CREATININE: 0.91 mg/dL (ref 0.76–1.27)
Calcium: 9.9 mg/dL (ref 8.6–10.2)
GFR calc Af Amer: 91 mL/min/{1.73_m2} (ref >59)
GFR calc non Af Amer: 79 mL/min/{1.73_m2} (ref >59)
GLUCOSE: 95 mg/dL (ref 65–99)
Globulin, Total: 4.1 g/dL (ref 1.5–4.5)
Potassium: 4.7 mmol/L (ref 3.5–5.2)
Sodium: 142 mmol/L (ref 134–144)
Total Protein: 8 g/dL (ref 6.0–8.5)

## 2015-09-01 ENCOUNTER — Encounter: Payer: Medicare Other | Admitting: *Deleted

## 2015-09-01 DIAGNOSIS — J8410 Pulmonary fibrosis, unspecified: Secondary | ICD-10-CM | POA: Diagnosis not present

## 2015-09-01 NOTE — Progress Notes (Signed)
Daily Session Note  Patient Details  Name: Vincent Kennedy MRN: [id number] Date of Birth: February 17, 1933 Referring Provider:        Pulmonary Rehab from 08/25/2015 in [facility]   Referring Provider  Devona Konig MD      Encounter Date: 09/01/2015  Check In:     Session Check In - 09/01/15 1126    Check-In   Location [facility]   Staff Present Carson Myrtle, BS, RRT, Respiratory Therapist;Dede Dobesh Amedeo Plenty, BS, ACSM CEP, Exercise Physiologist;Jessica Luan Pulling, MA, ACSM RCEP, Exercise Physiologist   Supervising physician immediately available to respond to emergencies [name] immediately available ER MD   Physician(s) Drs. Marcelene [place] and [place]   Medication changes reported     No   Fall or balance concerns reported    No   Warm-up and Cool-down Performed on first and last piece of equipment   Resistance Training Performed No   VAD Patient? No   Pain Assessment   Currently in Pain? No/denies   Multiple Pain Sites No         Goals Met:  Proper associated with RPD/PD & O2 Sat Independence with exercise equipment Exercise tolerated well  Goals Unmet:  Not Applicable  Comments: Patient completed exercise prescription and all exercise goals during rehab session. The exercise was tolerated well and the patient is progressing in the program.     Dr. Emily Filbert is Medical Director for [place] and [name].

## 2015-09-02 NOTE — Progress Notes (Signed)
Pulmonary Individual Treatment Plan  Patient Details  Name: Vincent Kennedy MRN: [id number] Date of Birth: 1932/11/04 Referring Provider:        Pulmonary Rehab from 08/25/2015 in [facility] Cardiac and Pulmonary Rehab   Referring Provider  Devona Konig MD      Initial Encounter Date: 04/22/2015      Pulmonary Rehab from 08/25/2015 in [facility] Cardiac and Pulmonary Rehab   Referring Provider  Devona Konig MD      Visit Diagnosis: Pulmonary fibrosis ([place])  Patient's Home Medications on Admission:  Current outpatient prescriptions:    albuterol (PROVENTIL HFA;VENTOLIN HFA) 108 (90 Base) MCG/ACT inhaler, Inhale 2 puffs into the lungs every 6 (six) hours as needed for wheezing or shortness of breath., Disp: 1 Inhaler, Rfl: 2   aspirin 81 MG chewable tablet, Chew 81 mg by mouth daily., Disp: , Rfl:    azithromycin (ZITHROMAX) 250 MG tablet, One tab daily for three days, Disp: 3 each, Rfl: 0   carvedilol (COREG) 12.5 MG tablet, Take 12.5 tablets by mouth daily., Disp: , Rfl:    fluticasone (FLONASE) 50 MCG/ACT nasal spray, Reported on 03/12/2015, Disp: , Rfl:    ipratropium-albuterol (DUONEB) 0.5-2.5 (3) MG/3ML SOLN, Take 3 mLs by nebulization every 6 (six) hours., Disp: 360 mL, Rfl: 2   mometasone-formoterol (DULERA) 100-5 MCG/ACT AERO, Inhale 2 puffs into the lungs 2 (two) times daily., Disp: 1 Inhaler, Rfl: 1   pantoprazole (PROTONIX) 40 MG tablet, Take 1 tablet (40 mg total) by mouth daily as needed. Caution:prolonged use may increase risk of pneumonia, colitis, osteoporosis, anemia, Disp: 30 tablet, Rfl: 1   Pirfenidone (ESBRIET) 267 MG CAPS, Three pills by mouth three times a day (per pulmonologist), Disp: 1 capsule, Rfl:    rivastigmine (EXELON) 9.5 mg/24hr, Place 1 patch (9.5 mg total) onto the skin daily., Disp: 30 patch, Rfl: 2   rosuvastatin (CRESTOR) 20 MG tablet, Take by mouth., Disp: , Rfl:    zolpidem (AMBIEN) 10 MG tablet, Take 5 mg by mouth at bedtime as needed., Disp: ,  Rfl:   Past Medical History: Past Medical History  Diagnosis Date   Coronary artery disease     Cardiac cath September 2008: 20% left main stenosis, 40% mid LAD stenosis, 99% ostial first diagonal stenosis in a large branch and 90% ostial disease in second diagonal but the vessel was very small, 20% mid RCA stenosis with normal ejection fraction. Successful angioplasty and drug-eluting stent placement to the ostial first diagonal with a 2.5 x 15 mm Xience stent   Hyperlipidemia    Abnormal nuclear cardiac imaging test     Vincent. Fletcher Kennedy   Cataract    Sleep apnea     has not used CPAP recently.  Feels condition has improved.   Hypertension     BP has never been high(per pt).  Metoprolol is to keep BP low because of stent (afterload)   Shortness of breath dyspnea     with exertion   Wears hearing aid     (sometimes)   Oxygen deficiency    Pulmonary fibrosis ([place]) 08/26/2015    Tobacco Use: History  Smoking status   Never Smoker   Smokeless tobacco   Not on file    Labs: Recent Review Flowsheet Data    Labs for ITP Cardiac and Pulmonary Rehab Latest Ref Rng 05/12/2012 07/23/2013 03/20/2015 08/26/2015   Cholestrol 100 - 199 mg/dL 130 122 - 187   LDLCALC 0 - 99 mg/dL 84 72 - 131(H)  HDL >39 mg/dL 34(L) 39(L) - 34(L)   Trlycerides 0 - 149 mg/dL 62 55 - 110   PHART 7.350 - 7.450 - - 7.44 -   PCO2ART 32.0 - 48.0 mmHg - - 32 -   HCO3 21.0 - 28.0 mEq/L - - 21.7 -   ACIDBASEDEF 0.0 - 2.0 mmol/L - - 1.6 -   O2SAT - - - 93.7 -       ADL UCSD:     Pulmonary Assessment Scores      04/22/15 1300       ADL UCSD   ADL Phase Entry     SOB Score total 24     Rest 0     Walk 0     Stairs 1     Bath 0     Dress 0     Shop 2        Pulmonary Function Assessment:     Pulmonary Function Assessment - 04/22/15 1300    Pulmonary Function Tests   RV% 60 %   DLCO% 36 %   Initial Spirometry Results   FVC% 77 %   FEV1% 75 %   FEV1/FVC Ratio 75   Post Bronchodilator  Spirometry Results   FVC% 77 %   FEV1% 66 %   FEV1/FVC Ratio 66   Breath   Bilateral Breath Sounds Rales;Basilar   Shortness of Breath Yes;Fear of Shortness of Breath;Limiting activity      Exercise Target Goals:    Exercise Program Goal: Individual exercise prescription set with THRR, safety & activity barriers. Participant demonstrates ability to understand and report RPE using BORG scale, to self-measure pulse accurately, and to acknowledge the importance of the exercise prescription.  Exercise Prescription Goal: Starting with aerobic activity 30 plus minutes a day, 3 days per week for initial exercise prescription. Provide home exercise prescription and guidelines that participant acknowledges understanding prior to discharge.  Activity Barriers & Risk Stratification:     Activity Barriers & Cardiac Risk Stratification - 04/22/15 1300    Activity Barriers & Cardiac Risk Stratification   Activity Barriers Shortness of Breath;Deconditioning;Muscular Weakness   Cardiac Risk Stratification Moderate      6 Minute Walk:     6 Minute Walk      04/22/15 1634 06/06/15 1053 08/25/15 1157   6 Minute Walk   Phase Initial Mid Program Discharge   Distance 1460 feet 1230 feet 1175 feet   Distance % Change  -16 % -19.5 %   Walk Time 6 minutes 6 minutes 5.42 minutes   # of Rest Breaks  0 1  desaturated to 80% 35 sec   MPH   2.46   METS   2.33   RPE '13 15 13   '$ Perceived Dyspnea  '2 4 3   '$ VO2 Peak   8.2   Symptoms No No Yes (comment)   Comments   Had a hard time steering tank.  SaO2 dropped to 80% during test and took about 30-35 seconds to recover to 88%, pt did not feel that it was that low.   Resting HR 65 bpm 67 bpm 77 bpm   Resting BP 126/68 mmHg 130/62 mmHg 128/64 mmHg   Max Ex. HR 98 bpm 90 bpm 113 bpm   Max Ex. BP 146/78 mmHg 166/78 mmHg 128/64 mmHg   Interval HR   Baseline HR   77   2 Minute HR   89   3 Minute HR   106   4  Minute HR   113   5 Minute HR   110   6  Minute HR   112   2 Minute Post HR   76   Interval Heart Rate?   Yes   Interval Oxygen   Baseline Oxygen Saturation %   94 %   Baseline Liters of Oxygen   4 L   2 Minute Oxygen Saturation %   94 %   2 Minute Liters of Oxygen   4 L   3 Minute Oxygen Saturation %   89 %   3 Minute Liters of Oxygen   4 L   4 Minute Oxygen Saturation %   89 %  low 80% at 440   4 Minute Liters of Oxygen   4 L   5 Minute Oxygen Saturation %   86 %   5 Minute Liters of Oxygen   4 L   6 Minute Oxygen Saturation %   87 %   6 Minute Liters of Oxygen   4 L   2 Minute Post Oxygen Saturation %   97 %   2 Minute Post Liters of Oxygen   4 L      Initial Exercise Prescription:     Initial Exercise Prescription - 08/27/15 1000    Date of Initial Exercise RX and Referring Provider   Referring Provider Devona Konig MD      Perform Capillary Blood Glucose checks as needed.  Exercise Prescription Changes:     Exercise Prescription Changes      04/25/15 1000 04/25/15 1500 04/30/15 1100 05/05/15 1000 05/07/15 1200   Response to Exercise   Blood Pressure (Admit) 124/82 mmHg    158/62 mmHg   Blood Pressure (Exercise) 146/74 mmHg    160/80 mmHg   Blood Pressure (Exit) 124/62 mmHg    98/62 mmHg   Heart Rate (Admit) 73 bpm    73 bpm   Heart Rate (Exercise) 95 bpm    100 bpm   Heart Rate (Exit) 71 bpm    83 bpm   Oxygen Saturation (Admit) 100 %    99 %   Oxygen Saturation (Exercise) 93 %    91 %   Oxygen Saturation (Exit) 99 %    99 %   Rating of Perceived Exertion (Exercise) 11    12   Perceived Dyspnea (Exercise) 2    3   Symptoms None   none Nonw   Comments First day of exercise! Patient was oriented to the gym and the equipment functions and settings. Procedures and policies of the gym were outlined and explained. The patient's individual exercise prescription and treatment plan were reviewed with them. All starting workloads were established based on the results of the functional testing  done at the  initial intake visit. The plan for exercise progression was also introduced and progression will be customized based on the patient's performance and goals.    Increases in exercise prescription were reviewed with the patient. He tolerated the increase with no signs and symptoms.  Vincent Kennedy is progressing well in the program and can consistently exercise for the enite 45 minutes of aerobic exericse. We will focus progression efforts on intensity as the patient is able to tolerate them.    Duration Progress to 30 minutes of continuous aerobic without signs/symptoms of physical distress   Progress to 30 minutes of continuous aerobic without signs/symptoms of physical distress Progress to 30 minutes of continuous aerobic without signs/symptoms of physical distress  Intensity Rest + 30   Rest + 30 Rest + 30   Progression   Progression Continue progressive overload as per policy without signs/symptoms or physical distress.   Continue progressive overload as per policy without signs/symptoms or physical distress. Continue progressive overload as per policy without signs/symptoms or physical distress.   Resistance Training   Training Prescription (read-only) Yes  Yes Yes Yes   Weight (read-only) '2  4 4 4   '$ Reps (read-only) 10-15  10-15 10-15 10-15   Treadmill   MPH (read-only) 2.4 2.8 2.8 2.8 2.8   Grade (read-only) 0 0 0 0 0   Minutes (read-only) '10 10 15 15 15   '$ NuStep   Level (read-only) '2 3 3 3 3   '$ Watts (read-only) 40 40 40 40 40   Minutes (read-only) '10 10 15 15 15   '$ Recumbant Elliptical   Level (read-only) '3   4 4   '$ RPM (read-only)    35 35   Watts (read-only) 50       Minutes (read-only) '10   15 15     '$ 05/12/15 1000 05/19/15 1000 05/23/15 1000 05/26/15 0646 07/07/15 1100   Exercise Review   Progression Yes Yes Yes  Yes   Response to Exercise   Blood Pressure (Admit)    124/68 mmHg 128/64 mmHg   Blood Pressure (Exercise)    138/60 mmHg 158/60 mmHg   Blood Pressure (Exit)    122/62 mmHg     Heart Rate (Admit)    68 bpm 74 bpm   Heart Rate (Exercise)    97 bpm 94 bpm   Heart Rate (Exit)    74 bpm    Oxygen Saturation (Admit)    97 % 93 %   Oxygen Saturation (Exercise)    96 % 93 %   Oxygen Saturation (Exit)    99 %    Rating of Perceived Exertion (Exercise)    13 13   Perceived Dyspnea (Exercise)    3 3   Symptoms None None None None None   Comments Reviewed individualized exercise prescription and made increases per departmental policy. Exercise increases were discussed with the patient and they were able to perform the new work loads without issue (no signs or symptoms).  Reviewed individualized exercise prescription and made increases per departmental policy. Exercise increases were discussed with the patient and they were able to perform the new work loads without issue (no signs or symptoms).  Reviewed individualized exercise prescription and made increases per departmental policy. Exercise increases were discussed with the patient and they were able to perform the new work loads without issue (no signs or symptoms).      Duration Progress to 30 minutes of continuous aerobic without signs/symptoms of physical distress Progress to 30 minutes of continuous aerobic without signs/symptoms of physical distress Progress to 30 minutes of continuous aerobic without signs/symptoms of physical distress Progress to 30 minutes of continuous aerobic without signs/symptoms of physical distress Progress to 30 minutes of continuous aerobic without signs/symptoms of physical distress   Intensity Rest + 30 Rest + 30 Rest + 30 Rest + 30 Rest + 30   Progression   Progression Continue progressive overload as per policy without signs/symptoms or physical distress. Continue progressive overload as per policy without signs/symptoms or physical distress. Continue progressive overload as per policy without signs/symptoms or physical distress. Continue progressive overload as per policy without signs/symptoms or  physical distress. Continue progressive overload as per policy without signs/symptoms or  physical distress.   Resistance Training   Training Prescription     Yes   Weight     4   Reps     10-15   Training Prescription (read-only) Yes Yes      Weight (read-only) 4 4      Reps (read-only) 10-15 10-15      Treadmill   MPH     2.6   Grade     0   Minutes     15   MPH (read-only) 2.8 2.8      Grade (read-only) 0 0      Minutes (read-only) 15 15      NuStep   Level     6   Watts     50   Minutes     15   Level (read-only) 4 5      Watts (read-only) 40 45      Minutes (read-only) 15 15      Recumbant Elliptical   Level     6   Watts     35   Minutes     15   Level (read-only) 4 4      RPM (read-only) 35 35      Minutes (read-only) 15 15      REL-XR   Level   6 6 --   Watts   35 35 --   Level (read-only) 3 3      Watts (read-only) 50 50      Minutes (read-only) 15 15        07/23/15 1500 08/26/15 1300         Exercise Review   Progression Yes Yes      Response to Exercise   Blood Pressure (Admit) 110/60 mmHg 128/64 mmHg      Blood Pressure (Exercise) 148/66 mmHg 160/80 mmHg      Blood Pressure (Exit) 104/52 mmHg 120/72 mmHg      Heart Rate (Admit) 80 bpm 77 bpm      Heart Rate (Exercise) 100 bpm 100 bpm      Heart Rate (Exit) 82 bpm 84 bpm      Oxygen Saturation (Admit) 94 % 94 %      Oxygen Saturation (Exercise) 92 % 91 %      Oxygen Saturation (Exit) 96 % 94 %      Rating of Perceived Exertion (Exercise) 12 14      Perceived Dyspnea (Exercise) 2.5 3      Symptoms None None      Duration Progress to 30 minutes of continuous aerobic without signs/symptoms of physical distress Progress to 30 minutes of continuous aerobic without signs/symptoms of physical distress      Intensity Rest + 30 THRR New  102-127      Progression   Progression Continue progressive overload as per policy without signs/symptoms or physical distress. Continue to progress workloads to maintain  intensity without signs/symptoms of physical distress.      Average METs  2.5      Resistance Training   Training Prescription Yes Yes      Weight 4 4      Reps 10-15 10-15      Interval Training   Interval Training  No      Treadmill   MPH 2.6 2.6      Grade 0 0      Minutes 15 15      NuStep   Level 6 6  Watts 50 32      Minutes 15 15      METs  2.3      Recumbant Elliptical   Level 6 6      Watts 35 35      Minutes 15 15      METs  2.1      REL-XR   Level 6       Watts 35       Minutes 15          Exercise Comments:     Exercise Comments      06/06/15 1052 08/25/15 1206 08/26/15 1331       Exercise Comments Decreased on Mid 6MW test from intake distance. Patient insisted doing the walk at 2L/C of O2 to match what he did at the beginning, but he was advised to use 3-6L/C of O2. This may have led to a decrease in distance Pt decreased walk test distance from beginning and from mid program.  He stated that he had a hard time steering his tank during test and this may have reduced his distance.  Also, he had to stop during the test due to his decreased saturation. Pt has returned to rehab from being out and was able to return to current workloads.  He is on visit 84 and will be graduating soon.  We will continue to increase workloads.        Discharge Exercise Prescription (Final Exercise Prescription Changes):     Exercise Prescription Changes - 08/26/15 1300    Exercise Review   Progression Yes   Response to Exercise   Blood Pressure (Admit) 128/64 mmHg   Blood Pressure (Exercise) 160/80 mmHg   Blood Pressure (Exit) 120/72 mmHg   Heart Rate (Admit) 77 bpm   Heart Rate (Exercise) 100 bpm   Heart Rate (Exit) 84 bpm   Oxygen Saturation (Admit) 94 %   Oxygen Saturation (Exercise) 91 %   Oxygen Saturation (Exit) 94 %   Rating of Perceived Exertion (Exercise) 14   Perceived Dyspnea (Exercise) 3   Symptoms None   Duration Progress to 30 minutes of continuous  aerobic without signs/symptoms of physical distress   Intensity THRR New  102-127   Progression   Progression Continue to progress workloads to maintain intensity without signs/symptoms of physical distress.   Average METs 2.5   Resistance Training   Training Prescription Yes   Weight 4   Reps 10-15   Interval Training   Interval Training No   Treadmill   MPH 2.6   Grade 0   Minutes 15   NuStep   Level 6   Watts 32   Minutes 15   METs 2.3   Recumbant Elliptical   Level 6   Watts 35   Minutes 15   METs 2.1       Nutrition:  Target Goals: Understanding of nutrition guidelines, daily intake of sodium '1500mg'$ , cholesterol '200mg'$ , calories 30% from fat and 7% or less from saturated fats, daily to have 5 or more servings of fruits and vegetables.  Biometrics:     Pre Biometrics - 04/22/15 1637    Pre Biometrics   Height '5\' 7"'$  (1.702 m)   Weight 164 lb 8 oz (74.617 kg)   Waist Circumference 38 inches   Hip Circumference 39 inches   Waist to Hip Ratio 0.97 %   BMI (Calculated) 25.8       Nutrition Therapy Plan and Nutrition Goals:  Nutrition Therapy & Goals - 04/22/15 1300    Nutrition Therapy   Diet Vincent Clydie Braun would like to meet with the dietitian. He has recently lost about 10lbs by cutting his portion amounts. He does have a garden and eats a lot of vegetables. He drinks a mixture of juice and water and drink  8-9 glasses a day.      Nutrition Discharge: Rate Your Plate Scores:   Psychosocial: Target Goals: Acknowledge presence or absence of depression, maximize coping skills, provide positive support system. Participant is able to verbalize types and ability to use techniques and skills needed for reducing stress and depression.  Initial Review & Psychosocial Screening:     Initial Psych Review & Screening - 04/22/15 1300    Family Dynamics   Good Support System? Yes   Comments Vincent Clydie Braun has good support from his wife and 6 children. He is concerned  with this new diagnosis of Pulmonary Fibrosis and will know more when he attends his [facility] appointment with the pulmonologist who specializes in Interstitual diseases.   Barriers   Psychosocial barriers to participate in program The patient should benefit from training in stress management and relaxation.   Screening Interventions   Interventions Encouraged to exercise;Program counselor consult      Quality of Life Scores:     Quality of Life - 04/22/15 1300    Quality of Life Scores   Health/Function Pre 17.38 %   Socioeconomic Pre 23.57 %   Psych/Spiritual Pre 22.5 %   Family Pre 24.4 %   GLOBAL Pre 20.64 %      PHQ-9:     Recent Review Flowsheet Data    Depression screen [facility] 2/9 08/26/2015 04/22/2015 10/16/2014   Decreased Interest 0 0 0   Down, Depressed, Hopeless 0 0 0   PHQ - 2 Score 0 0 0   Altered sleeping - 0 -   Change in appetite - 0 -   Feeling bad or failure about yourself  - 0 -   Trouble concentrating - 0 -   Moving slowly or fidgety/restless - 3 -   Suicidal thoughts - 0 -   PHQ-9 Score - 3 -   Difficult doing work/chores - Somewhat difficult -      Psychosocial Evaluation and Intervention:     Psychosocial Evaluation - 08/25/15 1117    Discharge Psychosocial Assessment & Intervention   Comments Counselor met with Vincent Kennedy today for his final discharge evaluation.  He reports accomplishing most of his goals while in this program, sleeping well, good appetite and positive mood.  He was out for several weeks due to the birth of a new granddaughter, which was a little stressful as she had some initial health issues.  However, mother and baby are home safely and Vincent Kennedy is relieved.  His plan to purchase gym equipment has happened with report of getting a treadmill that he has been more consistently using lately.  Counselor commended Vincent Kennedy on his hard work and commitment to consistent exercise.        Psychosocial Re-Evaluation:     Psychosocial  Re-Evaluation      07/02/15 1050 07/23/15 1117         Psychosocial Re-Evaluation   Comments Counselor follow up with Vincent. Kennedy reporting he has been out of class for awhile due to the birth of a new granddaughter in the [place] area.  He states he is sleeping well and his appetite is good.  He  denies current symptoms of depression or anxiety and states his mood is generally positive most of the time.  Vincent Kennedy reports he has seen just a little improvement in his breathing since beginning this program.  He also reports a change in medications several weeks ago to help with this, but has not noticed any improvement to date.  Vincent Kennedy still plans to purchase some home gym equipment to continue consistent exercise once this program is completed.   Vincent Kennedy met with counselor today for a follow up reporting he has seen some positive benefits recently as a result of this program.  He states he has returned to some of his normal activities such as driving and gardening.  He states his mood has improved and he is breathing better; as well as sleeping well.  Counselor commended Vincent Kennedy on his hard work and the progress that he has made.          Education: Education Goals: Education classes will be provided on a weekly basis, covering required topics. Participant will state understanding/return demonstration of topics presented.  Learning Barriers/Preferences:     Learning Barriers/Preferences - 04/22/15 1300    Learning Barriers/Preferences   Learning Barriers None   Learning Preferences Group Instruction;Individual Instruction;Pictoral;Skilled Demonstration;Verbal Instruction;Written Material      Education Topics: Initial Evaluation Education: - Verbal, written and demonstration of respiratory meds, RPE/PD scales, oximetry and breathing techniques. Instruction on use of nebulizers and MDIs: cleaning and proper use, rinsing mouth with steroid doses and importance of monitoring MDI  activations.          Pulmonary Rehab from 07/28/2015 in [facility] Cardiac and Pulmonary Rehab   Date  04/22/15   Educator  LB   Instruction Review Code  2- meets goals/outcomes      General Nutrition Guidelines/Fats and Fiber: -Group instruction provided by verbal, written material, models and posters to present the general guidelines for heart healthy nutrition. Gives an explanation and review of dietary fats and fiber.      Pulmonary Rehab from 07/28/2015 in [facility] Cardiac and Pulmonary Rehab   Date  04/28/15   Educator  CE   Instruction Review Code  2- meets goals/outcomes      Controlling Sodium/Reading Food Labels: -Group verbal and written material supporting the discussion of sodium use in heart healthy nutrition. Review and explanation with models, verbal and written materials for utilization of the food label.      Pulmonary Rehab from 07/28/2015 in [facility] Cardiac and Pulmonary Rehab   Date  05/12/15   Educator  CR   Instruction Review Code  2- meets goals/outcomes      Exercise Physiology & Risk Factors: - Group verbal and written instruction with models to review the exercise physiology of the cardiovascular system and associated critical values. Details cardiovascular disease risk factors and the goals associated with each risk factor.   Aerobic Exercise & Resistance Training: - Gives group verbal and written discussion on the health impact of inactivity. On the components of aerobic and resistive training programs and the benefits of this training and how to safely progress through these programs.   Flexibility, Balance, General Exercise Guidelines: - Provides group verbal and written instruction on the benefits of flexibility and balance training programs. Provides general exercise guidelines with specific guidelines to those with heart or lung disease. Demonstration and skill practice provided.      Pulmonary Rehab from 07/28/2015 in [facility] Cardiac and Pulmonary Rehab   Date   05/07/15  Educator  RM   Instruction Review Code  2- meets goals/outcomes      Stress Management: - Provides group verbal and written instruction about the health risks of elevated stress, cause of high stress, and healthy ways to reduce stress.      Pulmonary Rehab from 07/28/2015 in [facility] Cardiac and Pulmonary Rehab   Date  05/14/15   Educator  [facility]   Instruction Review Code  2- meets goals/outcomes      Depression: - Provides group verbal and written instruction on the correlation between heart/lung disease and depressed mood, treatment options, and the stigmas associated with seeking treatment.   Exercise & Equipment Safety: - Individual verbal instruction and demonstration of equipment use and safety with use of the equipment.      Pulmonary Rehab from 07/28/2015 in [facility] [facility]   Date  04/25/15   Educator  [place]   Instruction Review Code  2- meets goals/outcomes      Infection Prevention: - Provides verbal and written material to individual with discussion of infection control including proper hand washing and proper equipment cleaning during exercise session.      Pulmonary Rehab from 07/28/2015 in [facility] Pulmonary Re[facility]   Date  04/25/15   Educator  [place]   Instruction Review Code  2- meets goals/outcomes      Falls Prevention: - Provides verbal and written material to individual with discussion of falls prevention and safety.      Pulmonary Rehab from 07/28/2015 in [facility] Pulmonary Rehab   Date  04/22/15   Educator  LB   Instruction Review Code  2- meets goals/outcomes      Diabetes: - Individual verbal and written instruction to review signs/symptoms of diabetes, desired ranges of glucose level fasting, after meals and with exercise. Advice that pre and post exercise glucose checks will be done for 3 sessions at entry of program.   Chronic Lung Diseases: - Group verbal and written instruction to review new updates, new respiratory  medications, new advancements in procedures and treatments. Provide informative websites and "800" numbers of self-education.      Pulmonary Rehab from 07/28/2015 in [facility] and Pul[facility] Re[facility]   Date  07/28/15   Educator  LB   Instruction Review Code  2- meets goals/outcomes      Lung Procedures: - Group verbal and written instruction to describe testing methods done to diagnose lung disease. Review the outcome of test results. Describe the treatment choices: Pulmonary Function Tests, ABGs and oximetry.      Pulmonary Rehab from 07/28/2015 in [facility] Cardiac and Pulmonary Rehab   Date  05/02/15   Educator  [place]   Instruction Review Code  2- meets goals/outcomes      Energy Conservation: - Provide group verbal and written instruction for methods to conserve energy, plan and organize activities. Instruct on pacing techniques, use of adaptive equipment and posture/positioning to relieve shortness of breath.   Triggers: - Group verbal and written instruction to review types of environmental controls: home humidity, furnaces, filters, dust mite/pet prevention, HEPA vacuums. To discuss weather changes, air quality and the benefits of nasal washing.   Exacerbations: - Group verbal and written instruction to provide: warning signs, infection symptoms, calling MD promptly, preventive modes, and value of vaccinations. Review: effective airway clearance, coughing and/or vibration techniques. Create an Sports administrator.   Oxygen: - Individual and group verbal and written instruction on oxygen therapy. Includes supplement oxygen, available portable oxygen  systems, continuous and intermittent flow rates, oxygen safety, concentrators, and Medicare reimbursement for oxygen.      Pulmonary Rehab from 07/28/2015 in [facility] Cardiac and Pulmonary Rehab   Date  04/22/15   Educator  LB   Instruction Review Code  2- meets goals/outcomes      Respiratory Medications: - Group verbal and written instruction to  review medications for lung disease. Drug class, frequency, complications, importance of spacers, rinsing mouth after steroid MDI's, and proper cleaning methods for nebulizers.      Pulmonary Rehab from 07/28/2015 in [facility] Cardiac and Pulmonary Rehab   Date  04/22/15   Educator  LB   Instruction Review Code  2- meets goals/outcomes      AED/CPR: - Group verbal and written instruction with the use of models to demonstrate the basic use of the AED with the basic ABC's of resuscitation.      Pulmonary Rehab from 07/28/2015 in [facility] Cardiac and Pulmonary Rehab   Date  05/23/15   Educator  CE   Instruction Review Code  2- meets goals/outcomes      Breathing Retraining: - Provides individuals verbal and written instruction on purpose, frequency, and proper technique of diaphragmatic breathing and pursed-lipped breathing. Applies individual practice skills.      Pulmonary Rehab from 07/28/2015 in [facility] Cardiac and Pulmonary Rehab   Date  04/22/15   Educator  [place]   Instruction Review Code  2- meets goals/outcomes      Anatomy and Physiology of the Lungs: - Group verbal and written instruction with the use of models to provide basic lung anatomy and physiology related to function, structure and complications of lung disease.   Heart Failure: - Group verbal and written instruction on the basics of heart failure: signs/symptoms, treatments, explanation of ejection fraction, enlarged heart and cardiomyopathy.   Sleep Apnea: - Individual verbal and written instruction to review Obstructive Sleep Apnea. Review of risk factors, methods for diagnosing and types of masks and machines for OSA.      Pulmonary Rehab from 07/28/2015 in [facility] Cardiac and Pulmonary Rehab   Date  04/22/15   Educator  LB   Instruction Review Code  2- meets goals/outcomes      Anxiety: - Provides group, verbal and written instruction on the correlation between heart/lung disease and anxiety, treatment options, and management of  anxiety.   Relaxation: - Provides group, verbal and written instruction about the benefits of relaxation for patients with heart/lung disease. Also provides patients with examples of relaxation techniques.   Knowledge Questionnaire Score:     Knowledge Questionnaire Score - 04/22/15 1300    Knowledge Questionnaire Score   Pre Score 0/10       Core Components/Risk Factors/Patient Goals at Admission:     Personal Goals and Risk Factors at Admission - 04/22/15 1300    Core Components/Risk Factors/Patient Goals on Admission    Weight Management Yes   Intervention (read-only) Learn and follow the exercise and diet guidelines while in the program. Utilize the nutrition and education classes to help gain knowledge of the diet and exercise expectations in the program  Vincent Kennedy does want to meet with the dietitian. He eats a lot of vegetables and has lowered his portion amounts. He drinks 8-9 glasses of water and juice a day.   Admit Weight 164 lb 8 oz (74.617 kg)   Goal Weight: Short Term 160 lb (72.576 kg)   Sedentary Yes   Intervention (read-only) While in program, learn and  follow the exercise prescription taught. Start at a low level workload and increase workload after able to maintain previous level for 30 minutes. Increase time before increasing intensity.  Vincent Kennedy is very active with yardwork and gardening and wants to continue exercising. He is considering a TM or EL for home use.   Improve shortness of breath with ADL's Yes  shortness of breath. He will benefit with monitoring his O2Sats and pacing.   Intervention (read-only) --  Vincent Kennedy has modified his activity with his increased shortness of breath. Family and friends noticed it, but Vincent Kennedy  did not  address the issue. Now that he has been diagonised with Pulmonary fibrosis, he can look back and see his increased    Develop more efficient breathing techniques such as purse lipped breathing and diaphragmatic breathing;  and practicing self-pacing with activity Yes   Intervention (read-only) While in program, learn and utilize the specific breathing techniques taught to you. Continue to practice and use the techniques as needed.  Instructed Vincent Kennedy on PLB and its benefit with shortness of breath with activity.   Increase knowledge of respiratory medications and ability to use respiratory devices properly  Yes   Intervention (read-only) While in program learn and demonstrate appropriate use of your oxygen therapy by increasing flow with exertion, manage oxygen tank operation, including continuous and intermittent flow.  Understanding oxygen is a drug ordered by your physician.;While in program, learn to administer MDI, nebulizer, and spacer properly.;Learn to take respiratory medicine as ordered.;While in program, learn to Clean MDI, nebulizers, and spacers properly.  Vincent Kennedy uses [facility] and a SVN with Albuterol .  He has oxygen: 2l/m rest and 3-4l/m activity. He has a Marine scientist with intermittent flow and uses [facility]. Vincent Kennedy wears a full face mask with his CPAP.   Understand more about Heart/Pulmonary Disease. Yes   Intervention While in program utilize professionals for any questions, and attend the education sessions. Great websites to use are www.americanheart.org or www.lung.org for reliable information.  Vincent Kennedy was recently diagnosed with Pulmonary Fibrosis. He has been referred to [facility] to see a specialist and is very interested in learning more about his disease.      Core Components/Risk Factors/Patient Goals Review:      Goals and Risk Factor Review      04/22/15 1300 04/25/15 1507 04/28/15 1000 04/28/15 1056 05/02/15 1415   Core Components/Risk Factors/Patient Goals Review   Personal Goals Review Improve shortness of breath with ADL's Develop more efficient breathing techniques such as purse lipped breathing and diaphragmatic breathing and practicing self-pacing with  activity. Understand more about Heart/Pulmonary Disease Increase Aerobic Exercise and Physical Activity    Increase Aerobic Exercise and Physical Activity (read-only)   Comments    Vincent Kennedy is on his second day of exercise in Pulmonary Rehab. We discussed his goals of wanting to be able to get back to doing yard work, gardening, and walking which are activities he enjoys. He stated that there is an incline on his driveway and several steps that he needs to climb to do these activities. We discussed how his exerciser routine can hopefully allow him to be able to do more without getting as short of breath, and eventurally get back to doing these ADL's.    Understand more about Heart/Pulmonary Disease (read-only)   Goals Progress/Improvement seen    Yes     Comments   Gave Vincent Kennedy a copy of the Pulmonary Fibrosis Guide for  Patients from the PF Association which has great educational material.     Improve shortness of breath with ADL's (read-only)   Goals Progress/Improvement seen      Yes   Comments     Vincent Kennedy says that he notices less shorness of breath when walking down his driveway.  He wants to try it without his O2 but he was advised against it at this time.   Breathing Techniques (read-only)   Goals Progress/Improvement seen   Yes Yes  Yes   Comments  Taught PLB today. Encouraged Vincent Kennedy to use PLB on the treadmill; it is helpful to him with his shortness of breath       05/19/15 1542 05/27/15 0852 06/04/15 1036 06/04/15 1459 06/11/15 1521   Core Components/Risk Factors/Patient Goals Review   Personal Goals Review Improve shortness of breath with ADL's Sedentary Other Improve shortness of breath with ADL's;Develop more efficient breathing techniques such as purse lipped breathing and diaphragmatic breathing and practicing self-pacing with activity. Increase knowledge of respiratory medications and ability to use respiratory devices properly.   Review Vincent Kennedy had increased shortness of  breath on the treadmill today. He had a big breakfast at 9:00am and states that could have increased his shortness of breath and lowered his O2Sat's.  Vincent Kennedy is learning to adjust his oxygen, but does have to be reminded at times.  Increasing activity Vincent. Emilio Kennedy has missed a few sessions and was discouraged about his weight and O2 saturation. He is determined to not lose his conditioning and the progress he has made. Vincent Kennedy was given information on the IPF Support Group which meets tomorrow evening. I gave him a copy of the IPF Foundation publication. Vincent Kennedy starts on [place] tomorrow and discussed the benefits of slowing the progression of his IPF and hepl with his shortness of breath. He is using PLB and is adjusting his oxygen flow rates with activity. Vincent Kennedy will start on his 2 pills/3 times a day of the [place]. He has tolerated the drug well.    Expected Outcomes [place] with adjusting oxygen with increased activity. Vincent. Emilio Kennedy is remaining active outside of class, but the current exercise regimen is helping him strengthen.  He should be able to progress on all equipment within next two weeks pending regular attendance. Vincent. Emilio Kennedy will resume his previous workloads but with regular attendance and with him doing his home exercises he is projected to make exercise increases and progression by early next week.  Be able to tolerate Esbriet and see improvement in his lung function; continue adjusting oxygen with activity with exercise and activites at home. Ability to take 3 pills/ 3 times a day of the Esbriet without symptoms.     06/16/15 1000 07/23/15 1000 09/02/15 0650       Core Components/Risk Factors/Patient Goals Review   Personal Goals Review Increase knowledge of respiratory medications and ability to use respiratory devices properly. Sedentary;Increase Strength and Stamina;Improve shortness of breath with ADL's;Develop more efficient breathing techniques such as purse lipped  breathing and diaphragmatic breathing and practicing self-pacing with activity.;Increase knowledge of respiratory medications and ability to use respiratory devices properly. Sedentary;Increase Strength and Stamina;Improve shortness of breath with ADL's;Develop more efficient breathing techniques such as purse lipped breathing and diaphragmatic breathing and practicing self-pacing with activity.;Increase knowledge of respiratory medications and ability to use respiratory devices properly.     Review Vincent Kennedy is now on the 2 pills of Esbriet and is doing well. He  will start the 3 pill dosage soon. He was concerned with higher heart rate episodes, but his cardiologist, Vincent Kennedy, examined him and was not concerned . He did not want to order a heart monitor at this time. Vincent Kennedy has improved his exercise goals and is increasing his stamina and endurance. He had a recent appointment with his [facility] physician, Vincent Ethel Rana who encouraged him to be act with his IPF. Vincent Kennedy is purchasing a TM and is interested in Independent FF through [place]. He uses his MDI 's and PLB appropriately . His physician may consider starting him on the second IPF drug, Nintoderib.  Vincent Kennedy is also doing some gardening at home and managing his shortness of breath with these activies with PLB and pacing. Vincent Kennedy will be graduating soon. He is currently looking into purchasing home equipment for exercise. He plans to purchase a Treadmill and possibly a NS. He has many things going on with his family and his clinic, so home exercise  will work better for him to [place] his current exercise levels. He manages his Pulmonary Fibrosis very well  - compliant with his Esbriet and willing to take  Nintoderib if recommended; managing his oxygen - adjusting flow rate according to his exertion level; has a good understanding of his albuterol; and uses the PLB technique with activity. He also would like to purchase a higher grade oximeter  for home use.     Expected Outcomes Continue with Esbriet symtom free. Continue managing his IPF daily through exercise and his medications.         Core Components/Risk Factors/Patient Goals at Discharge (Final Review):      Goals and Risk Factor Review - 09/02/15 0650    Core Components/Risk Factors/Patient Goals Review   Personal Goals Review Sedentary;Increase Strength and Stamina;Improve shortness of breath with ADL's;Develop more efficient breathing techniques such as purse lipped breathing and diaphragmatic breathing and practicing self-pacing with activity.;Increase knowledge of respiratory medications and ability to use respiratory devices properly.   Review Vincent Kennedy will be graduating soon. He is currently looking into purchasing home equipment for exercise. He plans to purchase a Treadmill and possibly a NS. He has many things going on with his family and his clinic, so home exercise  will work better for him to [place] his current exercise levels. He manages his Pulmonary Fibrosis very well  - compliant with his Esbriet and willing to take  Nintoderib if recommended; managing his oxygen - adjusting flow rate according to his exertion level; has a good understanding of his albuterol; and uses the PLB technique with activity. He also would like to purchase a higher grade oximeter for home use.      ITP Comments:     ITP Comments      04/25/15 1020 04/25/15 1514 04/28/15 1019 04/30/15 1114 05/02/15 1037   ITP Comments Personal and exercise goals expected to be met in 34 more sessions. Progress on specific individualized goals will be charted in patient's ITP. Upon completion of the program the patient will be comfortable managing exercise goals and progression on their own.  Spoke with Vincent Kennedy today about setting goals and working on the goals while in the program. Will review [place] goals  and set short term and long term goals after he has met with the RD.  Personal and exercise  goals expected to be met in 33 more sessions. Progress on specific individualized goals will be charted in patient's ITP. Upon completion  of the program the patient will be comfortable managing exercise goals and progression on their own.  Personal and exercise goals expected to be met in 32 more sessions. Progress on specific individualized goals will be charted in patient's ITP. Upon completion of the program the patient will be comfortable managing exercise goals and progression on their own.  Personal and exercise goals expected to be met in 31 more sessions. Progress on specific individualized goals will be charted in patient's ITP. Upon completion of the program the patient will be comfortable managing exercise goals and progression on their own.      05/02/15 1539 05/05/15 1033 05/07/15 1058 05/09/15 1054 05/12/15 1027   ITP Comments Vincent. [place] will meet with his Pulmonologist at [facility] for the first time on February 21st. Exercise and personal goals anticipated to be met in 30 more sessions. See ITP for detailed report on goals.  Exercise and personal goals anticipated to be met in 29 more sessions. See ITP for detailed report on goals.  Personal and exercise goals expected to be met in 28 more sessions. Progress on specific individualized goals will be charted in patient's ITP. Upon completion of the program the patient will be comfortable managing exercise goals and progression on their own.  Personal and exercise goals expected to be met in 27 more sessions. Progress on specific individualized goals will be charted in patient's ITP. Upon completion of the program the patient will be comfortable managing exercise goals and progression on their own.      05/14/15 1055 05/16/15 1043 05/19/15 1051 05/23/15 1048 05/30/15 1028   ITP Comments Personal and exercise goals expected to be met in 26 more sessions. Progress on specific individualized goals will be charted in patient's ITP. Upon completion of the  program the patient will be comfortable managing exercise goals and progression on their own.  Personal and exercise goals expected to be met in 25 more sessions. Progress on specific individualized goals will be charted in patient's ITP. Upon completion of the program the patient will be comfortable managing exercise goals and progression on their own.  Personal and exercise goals expected to be met in 24 more sessiosns. See ITP for specific notes no goal progression. Personal and exercise goals expected to be met in 23 more sessions. See ITP for specific notes on goal progression.  Personal and exercise goals expected to be met in 20 more sessions. Progress on specific individualized goals will be charted in patient's ITP. Upon completion of the program the patient will be comfortable managing exercise goals and progression on their own.      06/16/15 1000 07/02/15 1121         ITP Comments Vincent Kennedy will be out of [name] for 10 days - he will be visiting his daughter. Has been out for family reasons. Retruned today, no voiced concerns         Comments: 30 day note review

## 2015-09-10 DIAGNOSIS — R339 Retention of urine, unspecified: Secondary | ICD-10-CM | POA: Insufficient documentation

## 2015-09-22 ENCOUNTER — Encounter: Payer: Medicare Other | Attending: Internal Medicine

## 2015-09-22 DIAGNOSIS — J8410 Pulmonary fibrosis, unspecified: Secondary | ICD-10-CM | POA: Insufficient documentation

## 2015-09-24 ENCOUNTER — Encounter: Payer: Self-pay | Admitting: *Deleted

## 2015-09-24 DIAGNOSIS — J8410 Pulmonary fibrosis, unspecified: Secondary | ICD-10-CM

## 2015-10-06 ENCOUNTER — Ambulatory Visit
Admission: RE | Admit: 2015-10-06 | Discharge: 2015-10-06 | Disposition: A | Discharge status: Home / Self Care | Payer: Medicare Other | Source: Ambulatory Visit | Attending: Unknown Physician Specialty | Admitting: Unknown Physician Specialty

## 2015-10-06 ENCOUNTER — Other Ambulatory Visit: Payer: Self-pay | Admitting: Unknown Physician Specialty

## 2015-10-06 DIAGNOSIS — I700 Atherosclerosis of aorta: Secondary | ICD-10-CM | POA: Diagnosis not present

## 2015-10-06 DIAGNOSIS — J8410 Pulmonary fibrosis, unspecified: Secondary | ICD-10-CM | POA: Diagnosis not present

## 2015-10-06 DIAGNOSIS — R05 Cough: Secondary | ICD-10-CM

## 2015-10-06 DIAGNOSIS — R059 Cough, unspecified: Secondary | ICD-10-CM

## 2015-10-08 ENCOUNTER — Encounter: Payer: Self-pay | Admitting: *Deleted

## 2015-10-08 ENCOUNTER — Encounter: Payer: Self-pay | Admitting: Respiratory Therapy

## 2015-10-08 ENCOUNTER — Other Ambulatory Visit: Payer: Self-pay | Admitting: Internal Medicine

## 2015-10-08 DIAGNOSIS — J8410 Pulmonary fibrosis, unspecified: Secondary | ICD-10-CM

## 2015-10-08 NOTE — Progress Notes (Signed)
Called Dr Clydie BraunBhatti - he last attended 09/01/2015. He has had a very busy month with family and hopes to return to [name] soon to finish his 3 remaining sessions.

## 2015-10-17 ENCOUNTER — Ambulatory Visit: Admission: RE | Admit: 2015-10-17 | Payer: Medicare Other | Source: Ambulatory Visit

## 2015-10-22 ENCOUNTER — Encounter: Payer: Medicare Other | Attending: Internal Medicine

## 2015-10-22 ENCOUNTER — Encounter: Payer: Self-pay | Admitting: *Deleted

## 2015-10-22 DIAGNOSIS — J8410 Pulmonary fibrosis, unspecified: Secondary | ICD-10-CM | POA: Insufficient documentation

## 2015-10-27 ENCOUNTER — Encounter: Payer: Self-pay | Admitting: Respiratory Therapy

## 2015-10-27 DIAGNOSIS — J8410 Pulmonary fibrosis, unspecified: Secondary | ICD-10-CM

## 2015-10-27 NOTE — Progress Notes (Signed)
Pulmonary Individual Treatment Plan  Patient Details  Name: Vincent Kennedy MRN: [id number] Date of Birth: 1932/11/05 Referring Provider:   Flowsheet Row Pulmonary Rehab from 08/25/2015 in [facility] Cardiac and Pulmonary Rehab  Referring Provider  Devona Konig MD      Initial Encounter Date:  Flowsheet Row Pulmonary Rehab from 08/25/2015 in [facility] Cardiac and Pulmonary Rehab  Referring Provider  Devona Konig MD      Visit Diagnosis: Pulmonary fibrosis [facility])  Patient's Home Medications on Admission:  Current Outpatient Prescriptions:    albuterol (PROVENTIL HFA;VENTOLIN HFA) 108 (90 Base) MCG/ACT inhaler, Inhale 2 puffs into the lungs every 6 (six) hours as needed for wheezing or shortness of breath., Disp: 1 Inhaler, Rfl: 2   aspirin 81 MG chewable tablet, Chew 81 mg by mouth daily., Disp: , Rfl:    azithromycin (ZITHROMAX) 250 MG tablet, One tab daily for three days, Disp: 3 each, Rfl: 0   carvedilol (COREG) 12.5 MG tablet, Take 12.5 tablets by mouth daily., Disp: , Rfl:    fluticasone (FLONASE) 50 MCG/ACT nasal spray, Reported on 03/12/2015, Disp: , Rfl:    ipratropium-albuterol (DUONEB) 0.5-2.5 (3) MG/3ML SOLN, Take 3 mLs by nebulization every 6 (six) hours., Disp: 360 mL, Rfl: 2   mometasone-formoterol (DULERA) 100-5 MCG/ACT AERO, Inhale 2 puffs into the lungs 2 (two) times daily., Disp: 1 Inhaler, Rfl: 1   pantoprazole (PROTONIX) 40 MG tablet, Take 1 tablet (40 mg total) by mouth daily as needed. Caution:prolonged use may increase risk of pneumonia, colitis, osteoporosis, anemia, Disp: 30 tablet, Rfl: 1   Pirfenidone (ESBRIET) 267 MG CAPS, Three pills by mouth three times a day (per pulmonologist), Disp: 1 capsule, Rfl:    rivastigmine (EXELON) 9.5 mg/24hr, Place 1 patch (9.5 mg total) onto the skin daily., Disp: 30 patch, Rfl: 2   rosuvastatin (CRESTOR) 20 MG tablet, Take by mouth., Disp: , Rfl:    zolpidem (AMBIEN) 10 MG tablet, Take 5 mg by mouth at bedtime as needed.,  Disp: , Rfl:   Past Medical History: Past Medical History:  Diagnosis Date   Abnormal nuclear cardiac imaging test    Vincent. Fletcher Anon   Cataract    Coronary artery disease    Cardiac cath September 2008: 20% left main stenosis, 40% mid LAD stenosis, 99% ostial first diagonal stenosis in a large branch and 90% ostial disease in second diagonal but the vessel was very small, 20% mid RCA stenosis with normal ejection fraction. Successful angioplasty and drug-eluting stent placement to the ostial first diagonal with a 2.5 x 15 mm Xience stent   Hyperlipidemia    Hypertension    BP has never been high(per pt).  Metoprolol is to keep BP low because of stent (afterload)   Oxygen deficiency    Pulmonary fibrosis ([place]) 08/26/2015   Shortness of breath dyspnea    with exertion   Sleep apnea    has not used CPAP recently.  Feels condition has improved.   Wears hearing aid    (sometimes)    Tobacco Use: History  Smoking Status   Never Smoker  Smokeless Tobacco   Not on file    Labs: Recent Review Flowsheet Data    Labs for ITP Cardiac and Pulmonary Rehab Latest Ref Rng & Units 05/12/2012 07/23/2013 03/20/2015 08/26/2015   Cholestrol 100 - 199 mg/dL 130 122 - 187   LDLCALC 0 - 99 mg/dL 84 72 - 131(H)   HDL >39 mg/dL 34(L) 39(L) - 34(L)   Trlycerides 0 -  149 mg/dL 62 55 - 110   PHART 7.350 - 7.450 - - 7.44 -   PCO2ART 32.0 - 48.0 mmHg - - 32 -   HCO3 21.0 - 28.0 mEq/L - - 21.7 -   ACIDBASEDEF 0.0 - 2.0 mmol/L - - 1.6 -   O2SAT % - - 93.7 -       ADL UCSD:   Pulmonary Function Assessment:   Exercise Target Goals:    Exercise Program Goal: Individual exercise prescription set with THRR, safety & activity barriers. Participant demonstrates ability to understand and report RPE using BORG scale, to self-measure pulse accurately, and to acknowledge the importance of the exercise prescription.  Exercise Prescription Goal: Starting with aerobic activity 30 plus minutes a day, 3  days per week for initial exercise prescription. Provide home exercise prescription and guidelines that participant acknowledges understanding prior to discharge.  Activity Barriers & Risk Stratification:   6 Minute Walk:     6 Minute Walk    Row Name 06/06/15 1053 08/25/15 1157       6 Minute Walk   Phase Mid Program Discharge    Distance 1230 feet 1175 feet    Distance % Change -16 % -19.5 %    Walk Time 6 minutes 5.42 minutes    # of Rest Breaks 0 1  desaturated to 80% 35 sec    MPH  -- 2.46    METS  -- 2.33    RPE 15 13    Perceived Dyspnea  4 3    VO2 Peak  -- 8.2    Symptoms No Yes (comment)    Comments  -- Had a hard time steering tank.  SaO2 dropped to 80% during test and took about 30-35 seconds to recover to 88%, pt did not feel that it was that low.    Resting HR 67 bpm 77 bpm    Resting BP 130/62 128/64    Max Ex. HR 90 bpm 113 bpm    Max Ex. BP 166/78 128/64      Interval HR   Baseline HR  -- 77    2 Minute HR  -- 89    3 Minute HR  -- 106    4 Minute HR  -- 113    5 Minute HR  -- 110    6 Minute HR  -- 112    2 Minute Post HR  -- 76    Interval Heart Rate?  -- Yes      Interval Oxygen   Baseline Oxygen Saturation %  -- 94 %    Baseline Liters of Oxygen  -- 4 L    2 Minute Oxygen Saturation %  -- 94 %    2 Minute Liters of Oxygen  -- 4 L    3 Minute Oxygen Saturation %  -- 89 %    3 Minute Liters of Oxygen  -- 4 L    4 Minute Oxygen Saturation %  -- 89 %  low 80% at 440    4 Minute Liters of Oxygen  -- 4 L    5 Minute Oxygen Saturation %  -- 86 %    5 Minute Liters of Oxygen  -- 4 L    6 Minute Oxygen Saturation %  -- 87 %    6 Minute Liters of Oxygen  -- 4 L    2 Minute Post Oxygen Saturation %  -- 97 %    2 Minute Post Liters of  Oxygen  -- 4 L       Initial Exercise Prescription:     Initial Exercise Prescription - 08/27/15 1000      Date of Initial Exercise RX and Referring Provider   Referring Provider Devona Konig MD      Perform  Capillary Blood Glucose checks as needed.  Exercise Prescription Changes:     Exercise Prescription Changes    Row Name 04/30/15 1100 05/05/15 1000 05/07/15 1200 05/12/15 1000 05/19/15 1000     Exercise Review   Progression  --  --  -- Yes Yes     Response to Exercise   Blood Pressure (Admit)  --  -- 158/62  --  --   Blood Pressure (Exercise)  --  -- 160/80  --  --   Blood Pressure (Exit)  --  -- 98/62  --  --   Heart Rate (Admit)  --  -- 73 bpm  --  --   Heart Rate (Exercise)  --  -- 100 bpm  --  --   Heart Rate (Exit)  --  -- 83 bpm  --  --   Oxygen Saturation (Admit)  --  -- 99 %  --  --   Oxygen Saturation (Exercise)  --  -- 91 %  --  --   Oxygen Saturation (Exit)  --  -- 99 %  --  --   Rating of Perceived Exertion (Exercise)  --  -- 12  --  --   Perceived Dyspnea (Exercise)  --  -- 3  --  --   Symptoms  -- none Nonw None None   Comments  -- Increases in exercise prescription were reviewed with the patient. He tolerated the increase with no signs and symptoms.  Amjad is progressing well in the program and can consistently exercise for the enite 45 minutes of aerobic exericse. We will focus progression efforts on intensity as the patient is able to tolerate them.  Reviewed individualized exercise prescription and made increases per departmental policy. Exercise increases were discussed with the patient and they were able to perform the new work loads without issue (no signs or symptoms).  Reviewed individualized exercise prescription and made increases per departmental policy. Exercise increases were discussed with the patient and they were able to perform the new work loads without issue (no signs or symptoms).    Duration  -- Progress to 30 minutes of continuous aerobic without signs/symptoms of physical distress Progress to 30 minutes of continuous aerobic without signs/symptoms of physical distress Progress to 30 minutes of continuous aerobic without signs/symptoms of physical  distress Progress to 30 minutes of continuous aerobic without signs/symptoms of physical distress   Intensity  -- Rest + 30 Rest + 30 Rest + 30 Rest + 30     Progression   Progression  -- Continue progressive overload as per policy without signs/symptoms or physical distress. Continue progressive overload as per policy without signs/symptoms or physical distress. Continue progressive overload as per policy without signs/symptoms or physical distress. Continue progressive overload as per policy without signs/symptoms or physical distress.     Resistance Training   Training Prescription (read-only) Yes Yes Yes Yes Yes   Weight (read-only) '4 4 4 4 4   '$ Reps (read-only) 10-15 10-15 10-15 10-15 10-15     Treadmill   MPH (read-only) 2.8 2.8 2.8 2.8 2.8   Grade (read-only) 0 0 0 0 0   Minutes (read-only) '15 15 15 15 '$ 15  NuStep   Level (read-only) '3 3 3 4 5   '$ Watts (read-only) 40 40 40 40 45   Minutes (read-only) '15 15 15 15 15     '$ Recumbant Elliptical   Level (read-only)  -- '4 4 4 4   '$ RPM (read-only)  -- 35 35 35 35   Minutes (read-only)  -- '15 15 15 15     '$ REL-XR   Level (read-only)  --  --  -- 3 3   Watts (read-only)  --  --  -- 50 50   Minutes (read-only)  --  --  -- 15 15   Row Name 05/23/15 1000 05/26/15 0646 07/07/15 1100 07/23/15 1500 08/26/15 1300     Exercise Review   Progression Yes  -- Yes Yes Yes     Response to Exercise   Blood Pressure (Admit)  -- 124/68 128/64 110/60 128/64   Blood Pressure (Exercise)  -- 138/60 158/60 148/66 160/80   Blood Pressure (Exit)  -- 122/62  -- 104/52 120/72   Heart Rate (Admit)  -- 68 bpm 74 bpm 80 bpm 77 bpm   Heart Rate (Exercise)  -- 97 bpm 94 bpm 100 bpm 100 bpm   Heart Rate (Exit)  -- 74 bpm  -- 82 bpm 84 bpm   Oxygen Saturation (Admit)  -- 97 % 93 % 94 % 94 %   Oxygen Saturation (Exercise)  -- 96 % 93 % 92 % 91 %   Oxygen Saturation (Exit)  -- 99 %  -- 96 % 94 %   Rating of Perceived Exertion (Exercise)  -- '13 13 12 14    '$ Perceived Dyspnea (Exercise)  -- 3 3 2.5 3   Symptoms None None None None None   Comments Reviewed individualized exercise prescription and made increases per departmental policy. Exercise increases were discussed with the patient and they were able to perform the new work loads without issue (no signs or symptoms).   --  --  --  --   Duration Progress to 30 minutes of continuous aerobic without signs/symptoms of physical distress Progress to 30 minutes of continuous aerobic without signs/symptoms of physical distress Progress to 30 minutes of continuous aerobic without signs/symptoms of physical distress Progress to 30 minutes of continuous aerobic without signs/symptoms of physical distress Progress to 30 minutes of continuous aerobic without signs/symptoms of physical distress   Intensity Rest + 30 Rest + 30 Rest + 30 Rest + 30 THRR New  102-127     Progression   Progression Continue progressive overload as per policy without signs/symptoms or physical distress. Continue progressive overload as per policy without signs/symptoms or physical distress. Continue progressive overload as per policy without signs/symptoms or physical distress. Continue progressive overload as per policy without signs/symptoms or physical distress. Continue to progress workloads to maintain intensity without signs/symptoms of physical distress.   Average METs  --  --  --  -- 2.5     Resistance Training   Training Prescription  --  -- Yes Yes Yes   Weight  --  -- '4 4 4   '$ Reps  --  -- 10-15 10-15 10-15     Interval Training   Interval Training  --  --  --  -- No     Treadmill   MPH  --  -- 2.6 2.6 2.6   Grade  --  -- 0 0 0   Minutes  --  -- '15 15 15     '$ NuStep  Level  --  -- '6 6 6   '$ Watts  --  -- 50 50 32   Minutes  --  -- '15 15 15   '$ METs  --  --  --  -- 2.3     Recumbant Elliptical   Level  --  -- '6 6 6   '$ Watts  --  -- 35 35 35   Minutes  --  -- '15 15 15   '$ METs  --  --  --  -- 2.1     REL-XR   Level 6  6 -- 6  --   Watts 35 35 -- 35  --   Minutes  --  --  -- 15  --   Row Name 09/10/15 1500             Exercise Review   Progression Yes         Response to Exercise   Blood Pressure (Admit) 124/76       Blood Pressure (Exercise) 136/74       Blood Pressure (Exit) 110/64       Heart Rate (Admit) 75 bpm       Heart Rate (Exercise) 101 bpm       Heart Rate (Exit) 72 bpm       Oxygen Saturation (Admit) 99 %       Oxygen Saturation (Exercise) 95 %       Oxygen Saturation (Exit) 99 %       Rating of Perceived Exertion (Exercise) 13       Perceived Dyspnea (Exercise) 3       Symptoms None       Duration Progress to 45 minutes of aerobic exercise without signs/symptoms of physical distress       Intensity THRR unchanged         Progression   Progression Continue to progress workloads to maintain intensity without signs/symptoms of physical distress.       Average METs 2.6         Resistance Training   Training Prescription Yes  last visit did not do weights, left early       Weight 4       Reps 10-15         Interval Training   Interval Training No         Oxygen   Oxygen Continuous       Liters 4         Treadmill   MPH 2.6       Grade 0       Minutes 15         NuStep   Level 6       Watts 31       Minutes 15       METs 2.3         Recumbant Elliptical   Level 6       Watts 33       Minutes 15       METs 2.5          Exercise Comments:     Exercise Comments    Row Name 06/06/15 1052 08/25/15 1206 08/26/15 1331 09/10/15 1502 09/24/15 1521   Exercise Comments Decreased on Mid 6MW test from intake distance. Patient insisted doing the walk at 2L/C of O2 to match what he did at the beginning, but he was advised to use 3-6L/C of O2. This may have led to a decrease in  distance Pt decreased walk test distance from beginning and from mid program.  He stated that he had a hard time steering his tank during test and this may have reduced his distance.  Also, he had  to stop during the test due to his decreased saturation. Pt has returned to rehab from being out and was able to return to current workloads.  He is on visit 3 and will be graduating soon.  We will continue to increase workloads. Pt has only been here once since last review.  He does well when here, making good progress.  He only has three sessions left.  He is planning to buy equipment and oximeter for home use. Pt has not attended since last review.   [place] Name 10/08/15 1508 10/22/15 1516         Exercise Comments No attendance since last visit. Pt has not attended since last review.         Discharge Exercise Prescription (Final Exercise Prescription Changes):     Exercise Prescription Changes - 09/10/15 1500      Exercise Review   Progression Yes     Response to Exercise   Blood Pressure (Admit) 124/76   Blood Pressure (Exercise) 136/74   Blood Pressure (Exit) 110/64   Heart Rate (Admit) 75 bpm   Heart Rate (Exercise) 101 bpm   Heart Rate (Exit) 72 bpm   Oxygen Saturation (Admit) 99 %   Oxygen Saturation (Exercise) 95 %   Oxygen Saturation (Exit) 99 %   Rating of Perceived Exertion (Exercise) 13   Perceived Dyspnea (Exercise) 3   Symptoms None   Duration Progress to 45 minutes of aerobic exercise without signs/symptoms of physical distress   Intensity THRR unchanged     Progression   Progression Continue to progress workloads to maintain intensity without signs/symptoms of physical distress.   Average METs 2.6     Resistance Training   Training Prescription Yes  last visit did not do weights, left early   Weight 4   Reps 10-15     Interval Training   Interval Training No     Oxygen   Oxygen Continuous   Liters 4     Treadmill   MPH 2.6   Grade 0   Minutes 15     NuStep   Level 6   Watts 31   Minutes 15   METs 2.3     Recumbant Elliptical   Level 6   Watts 33   Minutes 15   METs 2.5       Nutrition:  Target Goals: Understanding of nutrition  guidelines, daily intake of sodium '1500mg'$ , cholesterol '200mg'$ , calories 30% from fat and 7% or less from saturated fats, daily to have 5 or more servings of fruits and vegetables.  Biometrics:    Nutrition Therapy Plan and Nutrition Goals:   Nutrition Discharge: Rate Your Plate Scores:   Psychosocial: Target Goals: Acknowledge presence or absence of depression, maximize coping skills, provide positive support system. Participant is able to verbalize types and ability to use techniques and skills needed for reducing stress and depression.  Initial Review & Psychosocial Screening:   Quality of Life Scores:   PHQ-9: Recent Review Flowsheet Data    Depression screen [facility] 2/9 08/26/2015 04/22/2015 10/16/2014   Decreased Interest 0 0 0   Down, Depressed, Hopeless 0 0 0   PHQ - 2 Score 0 0 0   Altered sleeping - 0 -   Change in appetite -  0 -   Feeling bad or failure about yourself  - 0 -   Trouble concentrating - 0 -   Moving slowly or fidgety/restless - 3 -   Suicidal thoughts - 0 -   PHQ-9 Score - 3 -   Difficult doing work/chores - Somewhat difficult -      Psychosocial Evaluation and Intervention:     Psychosocial Evaluation - 08/25/15 1117      Discharge Psychosocial Assessment & Intervention   Comments Counselor met with Vincent Kennedy today for his final discharge evaluation.  He reports accomplishing most of his goals while in this program, sleeping well, good appetite and positive mood.  He was out for several weeks due to the birth of a new granddaughter, which was a little stressful as she had some initial health issues.  However, mother and baby are home safely and Vincent. Kennedy is relieved.  His plan to purchase gym equipment has happened with report of getting a treadmill that he has been more consistently using lately.  Counselor commended Vincent. Kennedy on his hard work and commitment to consistent exercise.        Psychosocial Re-Evaluation:     Psychosocial  Re-Evaluation    Row Name 07/02/15 1050 07/23/15 1117           Psychosocial Re-Evaluation   Comments Counselor follow up with Vincent. Kennedy reporting he has been out of class for awhile due to the birth of a new granddaughter in the [place] area.  He states he is sleeping well and his appetite is good.  He denies current symptoms of depression or anxiety and states his mood is generally positive most of the time.  Vincent. Kennedy reports he has seen just a little improvement in his breathing since beginning this program.  He also reports a change in medications several weeks ago to help with this, but has not noticed any improvement to date.  Vincent. Kennedy still plans to purchase some home gym equipment to continue consistent exercise once this program is completed.   Vincent. Kennedy met with counselor today for a follow up reporting he has seen some positive benefits recently as a result of this program.  He states he has returned to some of his normal activities such as driving and gardening.  He states his mood has improved and he is breathing better; as well as sleeping well.  Counselor commended Vincent. Kennedy on his hard work and the progress that he has made.          Education: Education Goals: Education classes will be provided on a weekly basis, covering required topics. Participant will state understanding/return demonstration of topics presented.  Learning Barriers/Preferences:   Education Topics: Initial Evaluation Education: - Verbal, written and demonstration of respiratory meds, RPE/PD scales, oximetry and breathing techniques. Instruction on use of nebulizers and MDIs: cleaning and proper use, rinsing mouth with steroid doses and importance of monitoring MDI activations. Flowsheet Row Pulmonary Rehab from 07/28/2015 in [facility] Cardiac and Pulmonary Rehab  Date  04/22/15  Educator  LB  Instruction Review Code  2- meets goals/outcomes      General Nutrition Guidelines/Fats and Fiber: -Group instruction  provided by verbal, written material, models and posters to present the general guidelines for heart healthy nutrition. Gives an explanation and review of dietary fats and fiber. Flowsheet Row Pulmonary Rehab from 07/28/2015 in [facility] Cardiac and Pulmonary Rehab  Date  04/28/15  Educator  CE  Instruction Review Code  2- meets  goals/outcomes      Controlling Sodium/Reading Food Labels: -Group verbal and written material supporting the discussion of sodium use in heart healthy nutrition. Review and explanation with models, verbal and written materials for utilization of the food label. Flowsheet Row Pulmonary Rehab from 07/28/2015 in [facility] Cardiac and Pulmonary Rehab  Date  05/12/15  Educator  CR  Instruction Review Code  2- meets goals/outcomes      Exercise Physiology & Risk Factors: - Group verbal and written instruction with models to review the exercise physiology of the cardiovascular system and associated critical values. Details cardiovascular disease risk factors and the goals associated with each risk factor.   Aerobic Exercise & Resistance Training: - Gives group verbal and written discussion on the health impact of inactivity. On the components of aerobic and resistive training programs and the benefits of this training and how to safely progress through these programs.   Flexibility, Balance, General Exercise Guidelines: - Provides group verbal and written instruction on the benefits of flexibility and balance training programs. Provides general exercise guidelines with specific guidelines to those with heart or lung disease. Demonstration and skill practice provided. Flowsheet Row Pulmonary Rehab from 07/28/2015 in [facility] Cardiac and Pulmonary Rehab  Date  05/07/15  Educator  RM  Instruction Review Code  2- meets goals/outcomes      Stress Management: - Provides group verbal and written instruction about the health risks of elevated stress, cause of high stress, and healthy ways  to reduce stress. Flowsheet Row Pulmonary Rehab from 07/28/2015 in [facility] Cardiac and Pulmonary Rehab  Date  05/14/15  Educator  [facility]  Instruction Review Code  2- meets goals/outcomes      Depression: - Provides group verbal and written instruction on the correlation between heart/lung disease and depressed mood, treatment options, and the stigmas associated with seeking treatment.   Exercise & Equipment Safety: - Individual verbal instruction and demonstration of equipment use and safety with use of the equipment. Flowsheet Row Pulmonary Rehab from 07/28/2015 in [facility] Cardiac and Pulmonary Rehab  Date  04/25/15  Educator  SJ  Instruction Review Code  2- meets goals/outcomes      Infection Prevention: - Provides verbal and written material to individual with discussion of infection control including proper hand washing and proper equipment cleaning during exercise session. Flowsheet Row Pulmonary Rehab from 07/28/2015 in [facility] Cardiac and Pulmonary Rehab  Date  04/25/15  Educator  SJ  Instruction Review Code  2- meets goals/outcomes      Falls Prevention: - Provides verbal and written material to individual with discussion of falls prevention and safety. Flowsheet Row Pulmonary Rehab from 07/28/2015 in [facility] Cardiac and Pulmonary Rehab  Date  04/22/15  Educator  LB  Instruction Review Code  2- meets goals/outcomes      Diabetes: - Individual verbal and written instruction to review signs/symptoms of diabetes, desired ranges of glucose level fasting, after meals and with exercise. Advice that pre and post exercise glucose checks will be done for 3 sessions at entry of program.   Chronic Lung Diseases: - Group verbal and written instruction to review new updates, new respiratory medications, new advancements in procedures and treatments. Provide informative websites and "800" numbers of self-education. Flowsheet Row Pulmonary Rehab from 07/28/2015 in [facility] Cardiac and Pulmonary Rehab  Date   07/28/15  Educator  LB  Instruction Review Code  2- meets goals/outcomes      Lung Procedures: - Group verbal and written instruction to describe testing methods done  to diagnose lung disease. Review the outcome of test results. Describe the treatment choices: Pulmonary Function Tests, ABGs and oximetry. Flowsheet Row Pulmonary Rehab from 07/28/2015 in [facility] Cardiac and Pulmonary Rehab  Date  05/02/15  Educator  [place]  Instruction Review Code  2- meets goals/outcomes      Energy Conservation: - Provide group verbal and written instruction for methods to conserve energy, plan and organize activities. Instruct on pacing techniques, use of adaptive equipment and posture/positioning to relieve shortness of breath.   Triggers: - Group verbal and written instruction to review types of environmental controls: home humidity, furnaces, filters, dust mite/pet prevention, HEPA vacuums. To discuss weather changes, air quality and the benefits of nasal washing.   Exacerbations: - Group verbal and written instruction to provide: warning signs, infection symptoms, calling MD promptly, preventive modes, and value of vaccinations. Review: effective airway clearance, coughing and/or vibration techniques. Create an Sports administrator.   Oxygen: - Individual and group verbal and written instruction on oxygen therapy. Includes supplement oxygen, available portable oxygen systems, continuous and intermittent flow rates, oxygen safety, concentrators, and Medicare reimbursement for oxygen. Flowsheet Row Pulmonary Rehab from 07/28/2015 in [facility] Cardiac and Pulmonary Rehab  Date  04/22/15  Educator  LB  Instruction Review Code  2- meets goals/outcomes      Respiratory Medications: - Group verbal and written instruction to review medications for lung disease. Drug class, frequency, complications, importance of spacers, rinsing mouth after steroid MDI's, and proper cleaning methods for nebulizers. Flowsheet Row Pulmonary  Rehab from 07/28/2015 in [facility] Cardiac and Pulmonary Rehab  Date  04/22/15  Educator  LB  Instruction Review Code  2- meets goals/outcomes      AED/CPR: - Group verbal and written instruction with the use of models to demonstrate the basic use of the AED with the basic ABC's of resuscitation. Flowsheet Row Pulmonary Rehab from 07/28/2015 in [facility] Cardiac and Pulmonary Rehab  Date  05/23/15  Educator  CE  Instruction Review Code  2- meets goals/outcomes      Breathing Retraining: - Provides individuals verbal and written instruction on purpose, frequency, and proper technique of diaphragmatic breathing and pursed-lipped breathing. Applies individual practice skills. Flowsheet Row Pulmonary Rehab from 07/28/2015 in [facility] Cardiac and Pulmonary Rehab  Date  04/22/15  Educator  SJ  Instruction Review Code  2- meets goals/outcomes      Anatomy and Physiology of the Lungs: - Group verbal and written instruction with the use of models to provide basic lung anatomy and physiology related to function, structure and complications of lung disease.   Heart Failure: - Group verbal and written instruction on the basics of heart failure: signs/symptoms, treatments, explanation of ejection fraction, enlarged heart and cardiomyopathy.   Sleep Apnea: - Individual verbal and written instruction to review Obstructive Sleep Apnea. Review of risk factors, methods for diagnosing and types of masks and machines for OSA. Flowsheet Row Pulmonary Rehab from 07/28/2015 in [facility] Cardiac and Pulmonary Rehab  Date  04/22/15  Educator  LB  Instruction Review Code  2- meets goals/outcomes      Anxiety: - Provides group, verbal and written instruction on the correlation between heart/lung disease and anxiety, treatment options, and management of anxiety.   Relaxation: - Provides group, verbal and written instruction about the benefits of relaxation for patients with heart/lung disease. Also provides patients with  examples of relaxation techniques.   Knowledge Questionnaire Score:    Core Components/Risk Factors/Patient Goals at Admission:   Core Components/Risk Factors/Patient  Goals Review:      Goals and Risk Factor Review    Row Name 05/02/15 1415 05/19/15 1542 05/27/15 0852 06/04/15 1036 06/04/15 1459     Core Components/Risk Factors/Patient Goals Review   Personal Goals Review  -- Improve shortness of breath with ADL's Sedentary Other Improve shortness of breath with ADL's;Develop more efficient breathing techniques such as purse lipped breathing and diaphragmatic breathing and practicing self-pacing with activity.   Review  -- Vincent Kennedy had increased shortness of breath on the treadmill today. He had a big breakfast at 9:00am and states that could have increased his shortness of breath and lowered his O2Sat's.  Vincent Arrie Senate is learning to adjust his oxygen, but does have to be reminded at times.  Increasing activity Vincent. Emilio Kennedy has missed a few sessions and was discouraged about his weight and O2 saturation. He is determined to not lose his conditioning and the progress he has made. Vincent Kennedy was given information on the IPF Support Group which meets tomorrow evening. I gave him a copy of the IPF Foundation publication. Vincent Kennedy starts on [place] tomorrow and discussed the benefits of slowing the progression of his IPF and hepl with his shortness of breath. He is using PLB and is adjusting his oxygen flow rates with activity.   Expected Outcomes  -- Independence with adjusting oxygen with increased activity. Vincent. Emilio Kennedy is remaining active outside of class, but the current exercise regimen is helping him strengthen.  He should be able to progress on all equipment within next two weeks pending regular attendance. Vincent. Emilio Kennedy will resume his previous workloads but with regular attendance and with him doing his home exercises he is projected to make exercise increases and progression by early next week.   Be able to tolerate Esbriet and see improvement in his lung function; continue adjusting oxygen with activity with exercise and activites at home.     Improve shortness of breath with ADL's (read-only)   Goals Progress/Improvement seen  Yes  --  --  --  --   Comments Vincent. Kennedy says that he notices less shorness of breath when walking down his driveway.  He wants to try it without his O2 but he was advised against it at this time.  --  --  --  --     Breathing Techniques (read-only)   Goals Progress/Improvement seen  Yes  --  --  --  --   Row Name 06/11/15 1521 06/16/15 1000 07/23/15 1000 09/02/15 0650       Core Components/Risk Factors/Patient Goals Review   Personal Goals Review Increase knowledge of respiratory medications and ability to use respiratory devices properly. Increase knowledge of respiratory medications and ability to use respiratory devices properly. Sedentary;Increase Strength and Stamina;Improve shortness of breath with ADL's;Develop more efficient breathing techniques such as purse lipped breathing and diaphragmatic breathing and practicing self-pacing with activity.;Increase knowledge of respiratory medications and ability to use respiratory devices properly. Sedentary;Increase Strength and Stamina;Improve shortness of breath with ADL's;Develop more efficient breathing techniques such as purse lipped breathing and diaphragmatic breathing and practicing self-pacing with activity.;Increase knowledge of respiratory medications and ability to use respiratory devices properly.    Review Vincent Kennedy will start on his 2 pills/3 times a day of the [place]. He has tolerated the drug well.  Vincent Kennedy is now on the 2 pills of Esbriet and is doing well. He will start the 3 pill dosage soon. He was concerned with higher heart rate episodes, but his  cardiologist, Vincent Fletcher Anon, examined him and was not concerned . He did not want to order a heart monitor at this time. Vincent Kennedy has improved his exercise  goals and is increasing his stamina and endurance. He had a recent appointment with his [facility] physician, Vincent Ethel Rana who encouraged him to be act with his IPF. Vincent Kennedy is purchasing a TM and is interested in Independent FF through [place]. He uses his MDI 's and PLB appropriately . His physician may consider starting him on the second IPF drug, Nintoderib.  Vincent Kennedy is also doing some gardening at home and managing his shortness of breath with these activies with PLB and pacing. Vincent Kennedy will be graduating soon. He is currently looking into purchasing home equipment for exercise. He plans to purchase a Treadmill and possibly a NS. He has many things going on with his family and his clinic, so home exercise  will work better for him to [place] his current exercise levels. He manages his Pulmonary Fibrosis very well  - compliant with his Esbriet and willing to take  Nintoderib if recommended; managing his oxygen - adjusting flow rate according to his exertion level; has a good understanding of his albuterol; and uses the PLB technique with activity. He also would like to purchase a higher grade oximeter for home use.    Expected Outcomes Ability to take 3 pills/ 3 times a day of the Esbriet without symptoms. Continue with Esbriet symtom free. Continue managing his IPF daily through exercise and his medications.  --       Core Components/Risk Factors/Patient Goals at Discharge (Final Review):      Goals and Risk Factor Review - 09/02/15 0650      Core Components/Risk Factors/Patient Goals Review   Personal Goals Review Sedentary;Increase Strength and Stamina;Improve shortness of breath with ADL's;Develop more efficient breathing techniques such as purse lipped breathing and diaphragmatic breathing and practicing self-pacing with activity.;Increase knowledge of respiratory medications and ability to use respiratory devices properly.   Review Vincent Kennedy will be graduating soon. He is currently  looking into purchasing home equipment for exercise. He plans to purchase a Treadmill and possibly a NS. He has many things going on with his family and his clinic, so home exercise  will work better for him to [place] his current exercise levels. He manages his Pulmonary Fibrosis very well  - compliant with his Esbriet and willing to take  Nintoderib if recommended; managing his oxygen - adjusting flow rate according to his exertion level; has a good understanding of his albuterol; and uses the PLB technique with activity. He also would like to purchase a higher grade oximeter for home use.      ITP Comments:     ITP Comments    Row Name 04/30/15 1114 05/02/15 1037 05/02/15 1539 05/05/15 1033 05/07/15 1058   ITP Comments Personal and exercise goals expected to be met in 32 more sessions. Progress on specific individualized goals will be charted in patient's ITP. Upon completion of the program the patient will be comfortable managing exercise goals and progression on their own.  Personal and exercise goals expected to be met in 31 more sessions. Progress on specific individualized goals will be charted in patient's ITP. Upon completion of the program the patient will be comfortable managing exercise goals and progression on their own.  Vincent. Kennedy will meet with his Pulmonologist at [facility] for the first time on February 21st. Exercise and personal goals anticipated to  be met in 30 more sessions. See ITP for detailed report on goals.  Exercise and personal goals anticipated to be met in 29 more sessions. See ITP for detailed report on goals.    [place] Name 05/09/15 1054 05/12/15 1027 05/14/15 1055 05/16/15 1043 05/19/15 1051   ITP Comments Personal and exercise goals expected to be met in 28 more sessions. Progress on specific individualized goals will be charted in patient's ITP. Upon completion of the program the patient will be comfortable managing exercise goals and progression on their own.  Personal and  exercise goals expected to be met in 27 more sessions. Progress on specific individualized goals will be charted in patient's ITP. Upon completion of the program the patient will be comfortable managing exercise goals and progression on their own.  Personal and exercise goals expected to be met in 26 more sessions. Progress on specific individualized goals will be charted in patient's ITP. Upon completion of the program the patient will be comfortable managing exercise goals and progression on their own.  Personal and exercise goals expected to be met in 25 more sessions. Progress on specific individualized goals will be charted in patient's ITP. Upon completion of the program the patient will be comfortable managing exercise goals and progression on their own.  Personal and exercise goals expected to be met in 24 more sessiosns. See ITP for specific notes no goal progression.   [place] Name 05/23/15 1048 05/30/15 1028 06/16/15 1000 07/02/15 1121 09/10/15 1503   ITP Comments Personal and exercise goals expected to be met in 23 more sessions. See ITP for specific notes on goal progression.  Personal and exercise goals expected to be met in 20 more sessions. Progress on specific individualized goals will be charted in patient's ITP. Upon completion of the program the patient will be comfortable managing exercise goals and progression on their own.  Vincent Kennedy will be out of [name] for 10 days - he will be visiting his daughter. Has been out for family reasons. Retruned today, no voiced concerns Pt has only attended once since last reivew.   [place] Name 09/24/15 1521 10/08/15 1508 10/08/15 1521 10/22/15 1516 10/27/15 0823   ITP Comments Pt has not attended since last review. No attendance since last visit. Called Vincent Kennedy - he last attended 09/01/2015. He has had a very busy month with family and hopes to return to [place] soon to finish his 3 remaining sessions. Pt has not attended since last review. Called Vincent Kennedy  10/12/15, he last attended 09/01/15 and has been very busy with family. He plans to return to [place] and finish the program. He has 3 sessions left to complete.      Comments: 30 day note review

## 2015-11-05 ENCOUNTER — Encounter: Payer: Self-pay | Admitting: *Deleted

## 2015-11-05 DIAGNOSIS — J8410 Pulmonary fibrosis, unspecified: Secondary | ICD-10-CM

## 2015-11-10 ENCOUNTER — Ambulatory Visit
Admission: RE | Admit: 2015-11-10 | Discharge: 2015-11-10 | Disposition: A | Discharge status: Home / Self Care | Payer: Medicare Other | Source: Ambulatory Visit | Attending: Internal Medicine | Admitting: Internal Medicine

## 2015-11-10 DIAGNOSIS — I700 Atherosclerosis of aorta: Secondary | ICD-10-CM | POA: Diagnosis not present

## 2015-11-10 DIAGNOSIS — I2510 Atherosclerotic heart disease of native coronary artery without angina pectoris: Secondary | ICD-10-CM | POA: Diagnosis not present

## 2015-11-10 DIAGNOSIS — J8410 Pulmonary fibrosis, unspecified: Secondary | ICD-10-CM

## 2015-11-10 DIAGNOSIS — R599 Enlarged lymph nodes, unspecified: Secondary | ICD-10-CM | POA: Diagnosis not present

## 2015-11-21 ENCOUNTER — Telehealth: Payer: Self-pay | Admitting: Respiratory Therapy

## 2015-11-25 ENCOUNTER — Encounter: Payer: Self-pay | Admitting: Respiratory Therapy

## 2015-11-25 DIAGNOSIS — J8410 Pulmonary fibrosis, unspecified: Secondary | ICD-10-CM

## 2015-11-25 NOTE — Progress Notes (Signed)
Pulmonary Individual Treatment Plan  Patient Details  Name: Vincent Kennedy MRN: [id number] Date of Birth: [birth year] Referring Provider:   Flowsheet Row Pulmonary Rehab from 08/25/2015 in [facility] Cardiac and Pulmonary Rehab  Referring Provider  Devona Konig MD      Initial Encounter Date:  Flowsheet Row Pulmonary Rehab from 08/25/2015 in [facility] Cardiac and Pulmonary Rehab  Referring Provider  Devona Konig MD      Visit Diagnosis: Pulmonary fibrosis [facility])  Patient's Home Medications on Admission:  Current Outpatient Prescriptions:    albuterol (PROVENTIL HFA;VENTOLIN HFA) 108 (90 Base) MCG/ACT inhaler, Inhale 2 puffs into the lungs every 6 (six) hours as needed for wheezing or shortness of breath., Disp: 1 Inhaler, Rfl: 2   aspirin 81 MG chewable tablet, Chew 81 mg by mouth daily., Disp: , Rfl:    azithromycin (ZITHROMAX) 250 MG tablet, One tab daily for three days, Disp: 3 each, Rfl: 0   carvedilol (COREG) 12.5 MG tablet, Take 12.5 tablets by mouth daily., Disp: , Rfl:    fluticasone (FLONASE) 50 MCG/ACT nasal spray, Reported on 03/12/2015, Disp: , Rfl:    ipratropium-albuterol (DUONEB) 0.5-2.5 (3) MG/3ML SOLN, Take 3 mLs by nebulization every 6 (six) hours., Disp: 360 mL, Rfl: 2   mometasone-formoterol (DULERA) 100-5 MCG/ACT AERO, Inhale 2 puffs into the lungs 2 (two) times daily., Disp: 1 Inhaler, Rfl: 1   pantoprazole (PROTONIX) 40 MG tablet, Take 1 tablet (40 mg total) by mouth daily as needed. Caution:prolonged use may increase risk of pneumonia, colitis, osteoporosis, anemia, Disp: 30 tablet, Rfl: 1   Pirfenidone (ESBRIET) 267 MG CAPS, Three pills by mouth three times a day (per pulmonologist), Disp: 1 capsule, Rfl:    rivastigmine (EXELON) 9.5 mg/24hr, Place 1 patch (9.5 mg total) onto the skin daily., Disp: 30 patch, Rfl: 2   rosuvastatin (CRESTOR) 20 MG tablet, Take by mouth., Disp: , Rfl:    zolpidem (AMBIEN) 10 MG tablet, Take 5 mg by mouth at bedtime as needed.,  Disp: , Rfl:   Past Medical History: Past Medical History:  Diagnosis Date   Abnormal nuclear cardiac imaging test    Dr. Fletcher Anon   Cataract    Coronary artery disease    Cardiac cath September 2008: 20% left main stenosis, 40% mid LAD stenosis, 99% ostial first diagonal stenosis in a large branch and 90% ostial disease in second diagonal but the vessel was very small, 20% mid RCA stenosis with normal ejection fraction. Successful angioplasty and drug-eluting stent placement to the ostial first diagonal with a 2.5 x 15 mm Xience stent   Hyperlipidemia    Hypertension    BP has never been high(per pt).  Metoprolol is to keep BP low because of stent (afterload)   Oxygen deficiency    Pulmonary fibrosis ([place]) 08/26/2015   Shortness of breath dyspnea    with exertion   Sleep apnea    has not used CPAP recently.  Feels condition has improved.   Wears hearing aid    (sometimes)    Tobacco Use: History  Smoking Status   Never Smoker  Smokeless Tobacco   Not on file    Labs: Recent Review Flowsheet Data    Labs for ITP Cardiac and Pulmonary Rehab Latest Ref Rng & Units 05/12/2012 07/23/2013 03/20/2015 08/26/2015   Cholestrol 100 - 199 mg/dL 130 122 - 187   LDLCALC 0 - 99 mg/dL 84 72 - 131(H)   HDL >39 mg/dL 34(L) 39(L) - 34(L)   Trlycerides 0 -  149 mg/dL 62 55 - 110   PHART 7.350 - 7.450 - - 7.44 -   PCO2ART 32.0 - 48.0 mmHg - - 32 -   HCO3 21.0 - 28.0 mEq/L - - 21.7 -   ACIDBASEDEF 0.0 - 2.0 mmol/L - - 1.6 -   O2SAT % - - 93.7 -       ADL UCSD:   Pulmonary Function Assessment:   Exercise Target Goals:    Exercise Program Goal: Individual exercise prescription set with THRR, safety & activity barriers. Participant demonstrates ability to understand and report RPE using BORG scale, to self-measure pulse accurately, and to acknowledge the importance of the exercise prescription.  Exercise Prescription Goal: Starting with aerobic activity 30 plus minutes a day, 3  days per week for initial exercise prescription. Provide home exercise prescription and guidelines that participant acknowledges understanding prior to discharge.  Activity Barriers & Risk Stratification:   6 Minute Walk:     6 Minute Walk    Row Name 06/06/15 1053 08/25/15 1157       6 Minute Walk   Phase Mid Program Discharge    Distance 1230 feet 1175 feet    Distance % Change -16 % -19.5 %    Walk Time 6 minutes 5.42 minutes    # of Rest Breaks 0 1  desaturated to 80% 35 sec    MPH  -- 2.46    METS  -- 2.33    RPE 15 13    Perceived Dyspnea  4 3    VO2 Peak  -- 8.2    Symptoms No Yes (comment)    Comments  -- Had a hard time steering tank.  SaO2 dropped to 80% during test and took about 30-35 seconds to recover to 88%, pt did not feel that it was that low.    Resting HR 67 bpm 77 bpm    Resting BP 130/62 128/64    Max Ex. HR 90 bpm 113 bpm    Max Ex. BP 166/78 128/64      Interval HR   Baseline HR  -- 77    2 Minute HR  -- 89    3 Minute HR  -- 106    4 Minute HR  -- 113    5 Minute HR  -- 110    6 Minute HR  -- 112    2 Minute Post HR  -- 76    Interval Heart Rate?  -- Yes      Interval Oxygen   Baseline Oxygen Saturation %  -- 94 %    Baseline Liters of Oxygen  -- 4 L    2 Minute Oxygen Saturation %  -- 94 %    2 Minute Liters of Oxygen  -- 4 L    3 Minute Oxygen Saturation %  -- 89 %    3 Minute Liters of Oxygen  -- 4 L    4 Minute Oxygen Saturation %  -- 89 %  low 80% at 440    4 Minute Liters of Oxygen  -- 4 L    5 Minute Oxygen Saturation %  -- 86 %    5 Minute Liters of Oxygen  -- 4 L    6 Minute Oxygen Saturation %  -- 87 %    6 Minute Liters of Oxygen  -- 4 L    2 Minute Post Oxygen Saturation %  -- 97 %    2 Minute Post Liters of  Oxygen  -- 4 L       Initial Exercise Prescription:     Initial Exercise Prescription - 08/27/15 1000      Date of Initial Exercise RX and Referring Provider   Referring Provider Devona Konig MD      Perform  Capillary Blood Glucose checks as needed.  Exercise Prescription Changes:     Exercise Prescription Changes    Row Name 07/07/15 1100 07/23/15 1500 08/26/15 1300 09/10/15 1500       Exercise Review   Progression Yes Yes Yes Yes      Response to Exercise   Blood Pressure (Admit) 128/64 110/60 128/64 124/76    Blood Pressure (Exercise) 158/60 148/66 160/80 136/74    Blood Pressure (Exit)  -- 104/52 120/72 110/64    Heart Rate (Admit) 74 bpm 80 bpm 77 bpm 75 bpm    Heart Rate (Exercise) 94 bpm 100 bpm 100 bpm 101 bpm    Heart Rate (Exit)  -- 82 bpm 84 bpm 72 bpm    Oxygen Saturation (Admit) 93 % 94 % 94 % 99 %    Oxygen Saturation (Exercise) 93 % 92 % 91 % 95 %    Oxygen Saturation (Exit)  -- 96 % 94 % 99 %    Rating of Perceived Exertion (Exercise) _0 Perceived Dyspnea (Exercise) 3 2._1 Symptoms None None None None    Duration Progress to 30 minutes of continuous aerobic without signs/symptoms of physical distress Progress to 30 minutes of continuous aerobic without signs/symptoms of physical distress Progress to 30 minutes of continuous aerobic without signs/symptoms of physical distress Progress to 45 minutes of aerobic exercise without signs/symptoms of physical distress    Intensity Rest + 30 Rest + 30 THRR New  102-127 THRR unchanged      Progression   Progression Continue progressive overload as per policy without signs/symptoms or physical distress. Continue progressive overload as per policy without signs/symptoms or physical distress. Continue to progress workloads to maintain intensity without signs/symptoms of physical distress. Continue to progress workloads to maintain intensity without signs/symptoms of physical distress.    Average METs  --  -- 2.5 2.6      Resistance Training   Training Prescription Yes Yes Yes Yes  last visit did not do weights, left early    Weight _2 Reps 10-15 10-15 10-15 10-15      Interval Training   Interval  Training  --  -- No No      Oxygen   Oxygen  --  --  -- Continuous    Liters  --  --  -- 4      Treadmill   MPH 2.6 2.6 2.6 2.6    Grade 0 0 0 0    Minutes _3 NuStep   Level _4 Watts 50 50 32 31    Minutes _5 METs  --  -- 2.3 2.3      Recumbant Elliptical   Level _6 Watts 35 35 35 33    Minutes _7 METs  --  -- 2.1 2.5      REL-XR   Level -- 6  --  --    Watts -- 35  --  --  Minutes  -- 15  --  --       Exercise Comments:     Exercise Comments    Row Name 06/06/15 1052 08/25/15 1206 08/26/15 1331 09/10/15 1502 09/24/15 1521   Exercise Comments Decreased on Mid 6MW test from intake distance. Patient insisted doing the walk at 2L/C of O2 to match what he did at the beginning, but he was advised to use 3-6L/C of O2. This may have led to a decrease in distance Pt decreased walk test distance from beginning and from mid program.  He stated that he had a hard time steering his tank during test and this may have reduced his distance.  Also, he had to stop during the test due to his decreased saturation. Pt has returned to rehab from being out and was able to return to current workloads.  He is on visit 39 and will be graduating soon.  We will continue to increase workloads. Pt has only been here once since last review.  He does well when here, making good progress.  He only has three sessions left.  He is planning to buy equipment and oximeter for home use. Pt has not attended since last review.   [place] Name 10/08/15 1508 10/22/15 1516 11/05/15 1455       Exercise Comments No attendance since last visit. Pt has not attended since last review. Pt has been out since last review.        Discharge Exercise Prescription (Final Exercise Prescription Changes):     Exercise Prescription Changes - 09/10/15 1500      Exercise Review   Progression Yes     Response to Exercise   Blood Pressure (Admit) 124/76   Blood Pressure  (Exercise) 136/74   Blood Pressure (Exit) 110/64   Heart Rate (Admit) 75 bpm   Heart Rate (Exercise) 101 bpm   Heart Rate (Exit) 72 bpm   Oxygen Saturation (Admit) 99 %   Oxygen Saturation (Exercise) 95 %   Oxygen Saturation (Exit) 99 %   Rating of Perceived Exertion (Exercise) 13   Perceived Dyspnea (Exercise) 3   Symptoms None   Duration Progress to 45 minutes of aerobic exercise without signs/symptoms of physical distress   Intensity THRR unchanged     Progression   Progression Continue to progress workloads to maintain intensity without signs/symptoms of physical distress.   Average METs 2.6     Resistance Training   Training Prescription Yes  last visit did not do weights, left early   Weight 4   Reps 10-15     Interval Training   Interval Training No     Oxygen   Oxygen Continuous   Liters 4     Treadmill   MPH 2.6   Grade 0   Minutes 15     NuStep   Level 6   Watts 31   Minutes 15   METs 2.3     Recumbant Elliptical   Level 6   Watts 33   Minutes 15   METs 2.5       Nutrition:  Target Goals: Understanding of nutrition guidelines, daily intake of sodium <1530m, cholesterol <2074m calories 30% from fat and 7% or less from saturated fats, daily to have 5 or more servings of fruits and vegetables.  Biometrics:    Nutrition Therapy Plan and Nutrition Goals:   Nutrition Discharge: Rate Your Plate Scores:   Psychosocial: Target Goals: Acknowledge presence or absence of depression, maximize coping skills, provide  positive support system. Participant is able to verbalize types and ability to use techniques and skills needed for reducing stress and depression.  Initial Review & Psychosocial Screening:   Quality of Life Scores:   PHQ-9: Recent Review Flowsheet Data    Depression screen [facility] 2/9 08/26/2015 04/22/2015 10/16/2014   Decreased Interest 0 0 0   Down, Depressed, Hopeless 0 0 0   PHQ - 2 Score 0 0 0   Altered sleeping - 0 -   Change in  appetite - 0 -   Feeling bad or failure about yourself  - 0 -   Trouble concentrating - 0 -   Moving slowly or fidgety/restless - 3 -   Suicidal thoughts - 0 -   PHQ-9 Score - 3 -   Difficult doing work/chores - Somewhat difficult -      Psychosocial Evaluation and Intervention:     Psychosocial Evaluation - 08/25/15 1117      Discharge Psychosocial Assessment & Intervention   Comments Counselor met with Vincent Kennedy today for his final discharge evaluation.  He reports accomplishing most of his goals while in this program, sleeping well, good appetite and positive mood.  He was out for several weeks due to the birth of a new granddaughter, which was a little stressful as she had some initial health issues.  However, mother and baby are home safely and Vincent Kennedy is relieved.  His plan to purchase gym equipment has happened with report of getting a treadmill that he has been more consistently using lately.  Counselor commended Vincent Kennedy on his hard work and commitment to consistent exercise.        Psychosocial Re-Evaluation:     Psychosocial Re-Evaluation    [place] Name 07/02/15 1050 07/23/15 1117           Psychosocial Re-Evaluation   Comments Counselor follow up with Vincent Kennedy reporting he has been out of class for awhile due to the birth of a new granddaughter in the [place] area.  He states he is sleeping well and his appetite is good.  He denies current symptoms of depression or anxiety and states his mood is generally positive most of the time.  Vincent Kennedy reports he has seen just a little improvement in his breathing since beginning this program.  He also reports a change in medications several weeks ago to help with this, but has not noticed any improvement to date.  Vincent Kennedy still plans to purchase some home gym equipment to continue consistent exercise once this program is completed.   Vincent Kennedy met with counselor today for a follow up reporting he has seen some positive benefits  recently as a result of this program.  He states he has returned to some of his normal activities such as driving and gardening.  He states his mood has improved and he is breathing better; as well as sleeping well.  Counselor commended Vincent Kennedy on his hard work and the progress that he has made.          Education: Education Goals: Education classes will be provided on a weekly basis, covering required topics. Participant will state understanding/return demonstration of topics presented.  Learning Barriers/Preferences:   Education Topics: Initial Evaluation Education: - Verbal, written and demonstration of respiratory meds, RPE/PD scales, oximetry and breathing techniques. Instruction on use of nebulizers and MDIs: cleaning and proper use, rinsing mouth with steroid doses and importance of monitoring MDI activations. Flowsheet Row Pulmonary Rehab from 07/28/2015  in [facility] Cardiac and Pulmonary Rehab  Date  04/22/15  Educator  LB  Instruction Review Code  2- meets goals/outcomes      General Nutrition Guidelines/Fats and Fiber: -Group instruction provided by verbal, written material, models and posters to present the general guidelines for heart healthy nutrition. Gives an explanation and review of dietary fats and fiber. Flowsheet Row Pulmonary Rehab from 07/28/2015 in [facility] Cardiac and Pulmonary Rehab  Date  04/28/15  Educator  CE  Instruction Review Code  2- meets goals/outcomes      Controlling Sodium/Reading Food Labels: -Group verbal and written material supporting the discussion of sodium use in heart healthy nutrition. Review and explanation with models, verbal and written materials for utilization of the food label. Flowsheet Row Pulmonary Rehab from 07/28/2015 in [facility] Cardiac and Pulmonary Rehab  Date  05/12/15  Educator  CR  Instruction Review Code  2- meets goals/outcomes      Exercise Physiology & Risk Factors: - Group verbal and written instruction with models to review  the exercise physiology of the cardiovascular system and associated critical values. Details cardiovascular disease risk factors and the goals associated with each risk factor.   Aerobic Exercise & Resistance Training: - Gives group verbal and written discussion on the health impact of inactivity. On the components of aerobic and resistive training programs and the benefits of this training and how to safely progress through these programs.   Flexibility, Balance, General Exercise Guidelines: - Provides group verbal and written instruction on the benefits of flexibility and balance training programs. Provides general exercise guidelines with specific guidelines to those with heart or lung disease. Demonstration and skill practice provided. Flowsheet Row Pulmonary Rehab from 07/28/2015 in [facility] Cardiac and Pulmonary Rehab  Date  05/07/15  Educator  RM  Instruction Review Code  2- meets goals/outcomes      Stress Management: - Provides group verbal and written instruction about the health risks of elevated stress, cause of high stress, and healthy ways to reduce stress. Flowsheet Row Pulmonary Rehab from 07/28/2015 in [facility] Cardiac and Pulmonary Rehab  Date  05/14/15  Educator  [facility]  Instruction Review Code  2- meets goals/outcomes      Depression: - Provides group verbal and written instruction on the correlation between heart/lung disease and depressed mood, treatment options, and the stigmas associated with seeking treatment.   Exercise & Equipment Safety: - Individual verbal instruction and demonstration of equipment use and safety with use of the equipment. Flowsheet Row Pulmonary Rehab from 07/28/2015 in [facility] Cardiac and Pulmonary Rehab  Date  04/25/15  Educator  SJ  Instruction Review Code  2- meets goals/outcomes      Infection Prevention: - Provides verbal and written material to individual with discussion of infection control including proper hand washing and proper equipment  cleaning during exercise session. Flowsheet Row Pulmonary Rehab from 07/28/2015 in [facility] Cardiac and Pulmonary Rehab  Date  04/25/15  Educator  SJ  Instruction Review Code  2- meets goals/outcomes      Falls Prevention: - Provides verbal and written material to individual with discussion of falls prevention and safety. Flowsheet Row Pulmonary Rehab from 07/28/2015 in [facility] Cardiac and Pulmonary Rehab  Date  04/22/15  Educator  LB  Instruction Review Code  2- meets goals/outcomes      Diabetes: - Individual verbal and written instruction to review signs/symptoms of diabetes, desired ranges of glucose level fasting, after meals and with exercise. Advice that pre and post exercise glucose checks  will be done for 3 sessions at entry of program.   Chronic Lung Diseases: - Group verbal and written instruction to review new updates, new respiratory medications, new advancements in procedures and treatments. Provide informative websites and "800" numbers of self-education. Flowsheet Row Pulmonary Rehab from 07/28/2015 in [facility] Cardiac and Pulmonary Rehab  Date  07/28/15  Educator  LB  Instruction Review Code  2- meets goals/outcomes      Lung Procedures: - Group verbal and written instruction to describe testing methods done to diagnose lung disease. Review the outcome of test results. Describe the treatment choices: Pulmonary Function Tests, ABGs and oximetry. Flowsheet Row Pulmonary Rehab from 07/28/2015 in [facility] Cardiac and Pulmonary Rehab  Date  05/02/15  Educator  [place]  Instruction Review Code  2- meets goals/outcomes      Energy Conservation: - Provide group verbal and written instruction for methods to conserve energy, plan and organize activities. Instruct on pacing techniques, use of adaptive equipment and posture/positioning to relieve shortness of breath.   Triggers: - Group verbal and written instruction to review types of environmental controls: home humidity, furnaces, filters,  dust mite/pet prevention, HEPA vacuums. To discuss weather changes, air quality and the benefits of nasal washing.   Exacerbations: - Group verbal and written instruction to provide: warning signs, infection symptoms, calling MD promptly, preventive modes, and value of vaccinations. Review: effective airway clearance, coughing and/or vibration techniques. Create an Sports administrator.   Oxygen: - Individual and group verbal and written instruction on oxygen therapy. Includes supplement oxygen, available portable oxygen systems, continuous and intermittent flow rates, oxygen safety, concentrators, and Medicare reimbursement for oxygen. Flowsheet Row Pulmonary Rehab from 07/28/2015 in [facility] Cardiac and Pulmonary Rehab  Date  04/22/15  Educator  LB  Instruction Review Code  2- meets goals/outcomes      Respiratory Medications: - Group verbal and written instruction to review medications for lung disease. Drug class, frequency, complications, importance of spacers, rinsing mouth after steroid MDI's, and proper cleaning methods for nebulizers. Flowsheet Row Pulmonary Rehab from 07/28/2015 in [facility] Cardiac and Pulmonary Rehab  Date  04/22/15  Educator  LB  Instruction Review Code  2- meets goals/outcomes      AED/CPR: - Group verbal and written instruction with the use of models to demonstrate the basic use of the AED with the basic ABC's of resuscitation. Flowsheet Row Pulmonary Rehab from 07/28/2015 in [facility] Cardiac and Pulmonary Rehab  Date  05/23/15  Educator  CE  Instruction Review Code  2- meets goals/outcomes      Breathing Retraining: - Provides individuals verbal and written instruction on purpose, frequency, and proper technique of diaphragmatic breathing and pursed-lipped breathing. Applies individual practice skills. Flowsheet Row Pulmonary Rehab from 07/28/2015 in [facility] Cardiac and Pulmonary Rehab  Date  04/22/15  Educator  SJ  Instruction Review Code  2- meets goals/outcomes       Anatomy and Physiology of the Lungs: - Group verbal and written instruction with the use of models to provide basic lung anatomy and physiology related to function, structure and complications of lung disease.   Heart Failure: - Group verbal and written instruction on the basics of heart failure: signs/symptoms, treatments, explanation of ejection fraction, enlarged heart and cardiomyopathy.   Sleep Apnea: - Individual verbal and written instruction to review Obstructive Sleep Apnea. Review of risk factors, methods for diagnosing and types of masks and machines for OSA. Flowsheet Row Pulmonary Rehab from 07/28/2015 in [facility] Cardiac and Pulmonary Rehab  Date  04/22/15  Educator  LB  Instruction Review Code  2- meets goals/outcomes      Anxiety: - Provides group, verbal and written instruction on the correlation between heart/lung disease and anxiety, treatment options, and management of anxiety.   Relaxation: - Provides group, verbal and written instruction about the benefits of relaxation for patients with heart/lung disease. Also provides patients with examples of relaxation techniques.   Knowledge Questionnaire Score:    Core Components/Risk Factors/Patient Goals at Admission:   Core Components/Risk Factors/Patient Goals Review:      Goals and Risk Factor Review    Row Name 06/04/15 1036 06/04/15 1459 06/11/15 1521 06/16/15 1000 07/23/15 1000     Core Components/Risk Factors/Patient Goals Review   Personal Goals Review Other Improve shortness of breath with ADL's;Develop more efficient breathing techniques such as purse lipped breathing and diaphragmatic breathing and practicing self-pacing with activity. Increase knowledge of respiratory medications and ability to use respiratory devices properly. Increase knowledge of respiratory medications and ability to use respiratory devices properly. Sedentary;Increase Strength and Stamina;Improve shortness of breath with  ADL's;Develop more efficient breathing techniques such as purse lipped breathing and diaphragmatic breathing and practicing self-pacing with activity.;Increase knowledge of respiratory medications and ability to use respiratory devices properly.   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 He is determined to not lose his conditioning and the progress he has made. 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 I gave him a copy of the [employer] publication. 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 He is using PLB and is adjusting his oxygen flow rates with activity. 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 He has tolerated the drug well.  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 He will start the 3 pill dosage soon. He was concerned with higher heart rate episodes, but his cardiologist, Dr Fletcher Anon, examined him and was not concerned . He did not want to order a heart monitor at this time. 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 He had a recent appointment with his [facility] physician, Dr Ethel Rana who encouraged him to be act with his IPF. 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 He uses his MDI 's and PLB appropriately . His physician may consider starting him on the second IPF drug, Nintoderib.  Dr Emilio Math is also doing some gardening at home and managing his shortness of breath with these activies with PLB and pacing.   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  Be able to tolerate Esbriet and see improvement in his  lung function; continue adjusting oxygen with activity with exercise and activites at home. Ability to take 3 pills/ 3 times a day of the Esbriet without symptoms. Continue with Esbriet symtom free. Continue managing his IPF daily through exercise and his medications.   [place] Name 09/02/15 0650             Core Components/Risk Factors/Patient Goals Review   Personal Goals Review Sedentary;Increase Strength and Stamina;Improve shortness of  breath with ADL's;Develop more efficient breathing techniques such as purse lipped breathing and diaphragmatic breathing and practicing self-pacing with activity.;Increase knowledge of respiratory medications and ability to use respiratory devices properly.       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 He is currently looking into purchasing home equipment for exercise. He plans to purchase a Treadmill and possibly a NS. He has many things going on with his family and his clinic, so home exercise  will work better for him to [place] his current exercise levels. He manages his Pulmonary Fibrosis very well  - compliant with his Esbriet and willing to take  Nintoderib if recommended; managing his oxygen - adjusting flow rate according to his exertion level; has a good understanding of his albuterol; and uses the PLB technique with activity. He also would like to purchase a higher grade oximeter for home use.          Core Components/Risk Factors/Patient Goals at Discharge (Final Review):      Goals and Risk Factor Review - 09/02/15 0650      Core Components/Risk Factors/Patient Goals Review   Personal Goals Review Sedentary;Increase Strength and Stamina;Improve shortness of breath with ADL's;Develop more efficient breathing techniques such as purse lipped breathing and diaphragmatic breathing and practicing self-pacing with activity.;Increase knowledge of respiratory medications and ability to use respiratory devices properly.   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 He is currently looking into purchasing home equipment for exercise. He plans to purchase a Treadmill and possibly a NS. He has many things going on with his family and his clinic, so home exercise  will work better for him to [place] his current exercise levels. He manages his Pulmonary Fibrosis very well  - compliant with his Esbriet and willing to take  Nintoderib if recommended; managing his oxygen - adjusting flow rate according to his exertion level; has a good understanding of his albuterol; and uses the PLB technique with activity. He also would like to purchase a higher grade oximeter for home use.      ITP Comments:     ITP Comments    Row Name 05/30/15 1028 06/16/15 1000 07/02/15 1121 09/10/15 1503 09/24/15 1521   ITP Comments Personal and exercise goals expected to be met in 20 more sessions. Progress on specific individualized goals will be charted in patient's ITP. Upon completion of the program the patient will be comfortable managing exercise goals and progression on their own.  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 Has been out for family reasons. Retruned today, no voiced concerns Pt has only attended once since last reivew. Pt has not attended since last review.   [place] Name 10/08/15 1508 10/08/15 1521 10/22/15 1516 10/27/15 0823 11/25/15 0720   ITP Comments No attendance since last visit. 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 He has had a very busy month with family and hopes to return to [place] soon to finish his 3 remaining sessions. Pt has not attended since last review. Called Mr Boeding 10/12/15, he last attended 09/01/15 and has been very busy with family. He plans to return to [place] and finish the program. He has 3 sessions left to complete. Mr Null was called on 11/21/15. He states he needs to begin [name] again and will contact Dr [name] for the ordered. I will discharge hime from [place] at this time.       Comments: Discharge from [place]  with plans to reenter the program. He last attended 09/01/15.

## 2015-11-26 NOTE — Telephone Encounter (Signed)
Telephone call  

## 2015-12-24 ENCOUNTER — Encounter: Payer: Self-pay | Admitting: Family Medicine

## 2015-12-24 ENCOUNTER — Ambulatory Visit (INDEPENDENT_AMBULATORY_CARE_PROVIDER_SITE_OTHER): Payer: Medicare Other | Admitting: Family Medicine

## 2015-12-24 DIAGNOSIS — F0280 Dementia in other diseases classified elsewhere without behavioral disturbance: Secondary | ICD-10-CM

## 2015-12-24 DIAGNOSIS — E782 Mixed hyperlipidemia: Secondary | ICD-10-CM | POA: Diagnosis not present

## 2015-12-24 DIAGNOSIS — G301 Alzheimer's disease with late onset: Secondary | ICD-10-CM

## 2015-12-24 DIAGNOSIS — I10 Essential (primary) hypertension: Secondary | ICD-10-CM | POA: Diagnosis not present

## 2015-12-24 DIAGNOSIS — Z5181 Encounter for therapeutic drug level monitoring: Secondary | ICD-10-CM | POA: Diagnosis not present

## 2015-12-24 DIAGNOSIS — J8410 Pulmonary fibrosis, unspecified: Secondary | ICD-10-CM | POA: Diagnosis not present

## 2015-12-24 DIAGNOSIS — I2510 Atherosclerotic heart disease of native coronary artery without angina pectoris: Secondary | ICD-10-CM | POA: Diagnosis not present

## 2015-12-24 LAB — COMPLETE METABOLIC PANEL WITH GFR
ALBUMIN: 3.5 g/dL — AB (ref 3.6–5.1)
ALK PHOS: 55 U/L (ref 40–115)
ALT: 13 U/L (ref 9–46)
AST: 23 U/L (ref 10–35)
BILIRUBIN TOTAL: 0.4 mg/dL (ref 0.2–1.2)
BUN: 16 mg/dL (ref 7–25)
CO2: 25 mmol/L (ref 20–31)
Calcium: 8.8 mg/dL (ref 8.6–10.3)
Chloride: 106 mmol/L (ref 98–110)
Creat: 0.84 mg/dL (ref 0.70–1.11)
GFR, Est African American: >89 mL/min (ref >=60)
GFR, Est Non African American: 82 mL/min (ref >=60)
GLUCOSE: 82 mg/dL (ref 65–99)
POTASSIUM: 4 mmol/L (ref 3.5–5.3)
SODIUM: 141 mmol/L (ref 135–146)
TOTAL PROTEIN: 7.4 g/dL (ref 6.1–8.1)

## 2015-12-24 LAB — CBC WITH DIFFERENTIAL/PLATELET
BASOS ABS: 81 {cells}/uL (ref 0–200)
Basophils Relative: 1 %
EOS ABS: 405 {cells}/uL (ref 15–500)
EOS PCT: 5 %
HEMATOCRIT: 42.1 % (ref 38.5–50.0)
HEMOGLOBIN: 13.5 g/dL (ref 13.2–17.1)
LYMPHS ABS: 1944 {cells}/uL (ref 850–3900)
Lymphocytes Relative: 24 %
MCH: 29.2 pg (ref 27.0–33.0)
MCHC: 32.1 g/dL (ref 32.0–36.0)
MCV: 90.9 fL (ref 80.0–100.0)
MONO ABS: 648 {cells}/uL (ref 200–950)
MPV: 8 fL (ref 7.5–12.5)
Monocytes Relative: 8 %
NEUTROS ABS: 5022 {cells}/uL (ref 1500–7800)
NEUTROS PCT: 62 %
Platelets: 190 10*3/uL (ref 140–400)
RBC: 4.63 MIL/uL (ref 4.20–5.80)
RDW: 13.8 % (ref 11.0–15.0)
WBC: 8.1 10*3/uL (ref 3.8–10.8)

## 2015-12-24 LAB — LIPID PANEL
CHOL/HDL RATIO: 5.5 ratio — AB (ref <=5.0)
CHOLESTEROL: 149 mg/dL (ref 125–200)
HDL: 27 mg/dL — ABNORMAL LOW (ref >=40)
LDL CALC: 101 mg/dL (ref <130)
Triglycerides: 105 mg/dL (ref <150)
VLDL: 21 mg/dL (ref <30)

## 2015-12-24 MED ORDER — ASPIRIN EC 325 MG PO TBEC: 325.0000 mg | DELAYED_RELEASE_TABLET | Freq: Every day | ORAL | 0 refills | Status: DC

## 2015-12-24 NOTE — Assessment & Plan Note (Signed)
Managed by pulmonologist 

## 2015-12-24 NOTE — Progress Notes (Signed)
BP 120/72   Pulse 68   Temp 97.7 F (36.5 C) (Oral)   Resp 16   Wt 159 lb (72.1 kg)   SpO2 94% Comment: 2L oxygen  BMI 24.90 kg/m   89% without oxygen  Subjective:    Patient ID: Vincent Kennedy, male    DOB: 1932/07/07, 82 y.o.   MRN: [id number]  HPI: Vincent Kennedy is a 82 y.o. male  Chief Complaint  Patient presents with  . Follow-up   Patient is here for follow-up His doctor at [facility] started remeron 15 mg at bedtime, Dr. Jarvis Newcomer; not just for insomnia he says He is sleeping the same, but now off of zolpidem; he did that for appetite too Lost a little weight but has gained it back  He has high cholesterol; he ate a little bit today, not much; not many fatty meats  Hypertension; under excellent control today; rarely checks BP, has the cuff; avoiding salt; no decongestants; no black licorice  Coronary artery disease; no recent issues; sees Dr. [name], sees him 2-3x a year; no chest pain; when he walks around, his pulse is usually around 60, then with too much activity, his oxygen level drops; he talked to Dr. [name], he said as long as pulse doesn't go up to 115 that he can do that  Depression screen [facility] 2/9 12/24/2015 08/26/2015 04/22/2015 10/16/2014  Decreased Interest 0 0 0 0  Down, Depressed, Hopeless 0 0 0 0  PHQ - 2 Score 0 0 0 0  Altered sleeping - - 0 -  Change in appetite - - 0 -  Feeling bad or failure about yourself  - - 0 -  Trouble concentrating - - 0 -  Moving slowly or fidgety/restless - - 3 -  Suicidal thoughts - - 0 -  PHQ-9 Score - - 3 -  Difficult doing work/chores - - Somewhat difficult -   Relevant past medical, surgical, family and social history reviewed Past Medical History:  Diagnosis Date  . Abnormal nuclear cardiac imaging test    Dr. [name]  . Cataract   . Coronary artery disease    Cardiac cath September 2008: 20% left main stenosis, 40% mid LAD stenosis, 99% ostial first diagonal stenosis in a large branch and 90% ostial disease in  second diagonal but the vessel was very small, 20% mid RCA stenosis with normal ejection fraction. Successful angioplasty and drug-eluting stent placement to the ostial first diagonal with a 2.5 x 15 mm Xience stent  . Hyperlipidemia   . Hypertension    BP has never been high(per pt).  Metoprolol is to keep BP low because of stent (afterload)  . Oxygen deficiency   . Pulmonary fibrosis (HCC) 08/26/2015  . Shortness of breath dyspnea    with exertion  . Sleep apnea    has not used CPAP recently.  Feels condition has improved.  . Wears hearing aid    (sometimes)   Past Surgical History:  Procedure Laterality Date  . CARDIAC CATHETERIZATION     2010  . CATARACT EXTRACTION W/PHACO Right 01/29/2015   Procedure: CATARACT EXTRACTION PHACO AND INTRAOCULAR LENS PLACEMENT (IOC);  Surgeon: Lockie Mola, MD;  Location[facility];  Service: Ophthalmology;  Laterality: Right;  RESTOR LENS CPAP  . CATARACT EXTRACTION W/PHACO Left 03/12/2015   Procedure: CATARACT EXTRACTION PHACO AND INTRAOCULAR LENS PLACEMENT (IOC);  Surgeon: Lockie Mola, MD;  Location: [facility];  Service: Ophthalmology;  Laterality: Left;  RESTOR SHUGARCAINE  .  COLONOSCOPY WITH ESOPHAGOGASTRODUODENOSCOPY (EGD)    . CORONARY ANGIOPLASTY  2010  . EYE SURGERY     cataract  . HERNIA REPAIR Bilateral    inguinal   Family History  Problem Relation Age of Onset  . CAD Father   . CAD Brother    Social History  Substance Use Topics  . Smoking status: Never Smoker  . Smokeless tobacco: Never Used  . Alcohol use No   Interim medical history since last visit reviewed. Allergies and medications reviewed  Review of Systems Per HPI unless specifically indicated above     Objective:    BP 120/72   Pulse 68   Temp 97.7 F (36.5 C) (Oral)   Resp 16   Wt 159 lb (72.1 kg)   SpO2 94% Comment: 2L oxygen  BMI 24.90 kg/m   Wt Readings from Last 3 Encounters:  12/24/15 159 lb (72.1 kg)  08/26/15  160 lb 8 oz (72.8 kg)  04/22/15 164 lb 8 oz (74.6 kg)    Physical Exam  Constitutional: He appears well-developed and well-nourished. No distress.  HENT:  Head: Normocephalic and atraumatic.  Eyes: EOM are normal. No scleral icterus.  Neck: No JVD present. No thyromegaly present.  Cardiovascular: Normal rate and regular rhythm.   Pulmonary/Chest: Effort normal. He has decreased breath sounds.  Abdominal: Soft. Bowel sounds are normal. He exhibits no distension.  Musculoskeletal: He exhibits no edema.  Neurological: Coordination normal.  Skin: Skin is warm and dry. No pallor.  Psychiatric: He has a normal mood and affect.      Assessment & Plan:   Problem List Items Addressed This Visit      Cardiovascular and Mediastinum   Hypertension goal BP (blood pressure) < 150/90 (Chronic)    Well-controlled; continue DASH guidelines      Relevant Medications   aspirin EC 325 MG tablet   Coronary artery disease involving native coronary artery of native heart without angina pectoris (Chronic)    No chest pain; continue carvedilol and aspirin      Relevant Medications   aspirin EC 325 MG tablet     Respiratory   Pulmonary fibrosis (HCC) (Chronic)    Managed by pulmonologist        Nervous and Auditory   Alzheimer's dementia without behavioral disturbance (Chronic)    Managed by specialist; patient still a good historian      Relevant Medications   mirtazapine (REMERON) 15 MG tablet     Other   Medication monitoring encounter    Monitor CBC on remeron; monitor sgpt on statin      Relevant Orders   CBC with Differential/Platelet (Completed)   COMPLETE METABOLIC PANEL WITH GFR (Completed)   Hyperlipidemia (Chronic)    Check lipids today; limit saturated fats; continue statin      Relevant Medications   aspirin EC 325 MG tablet   Other Relevant Orders   Lipid panel (Completed)    Other Visit Diagnoses   None.      Follow up plan: Return in about 4 months  (around 04/25/2016) for visit and fasting labs.  An after-visit summary was printed and given to the patient at check-out.  Please see the patient instructions which may contain other information and recommendations beyond what is mentioned above in the assessment and plan.  Meds ordered this encounter  Medications  . mirtazapine (REMERON) 15 MG tablet    Sig: Take 15 mg by mouth at bedtime as needed.  [name]. aspirin EC 325  MG tablet    Sig: Take 1 tablet (325 mg total) by mouth daily.    Dispense:  30 tablet    Refill:  0    Orders Placed This Encounter  Procedures  . CBC with Differential/Platelet  . Lipid panel  . COMPLETE METABOLIC PANEL WITH GFR

## 2015-12-24 NOTE — Assessment & Plan Note (Signed)
Monitor CBC on remeron; monitor sgpt on statin

## 2015-12-24 NOTE — Assessment & Plan Note (Signed)
No chest pain; continue carvedilol and aspirin

## 2015-12-24 NOTE — Assessment & Plan Note (Signed)
Well-controlled; continue DASH guidelines 

## 2015-12-24 NOTE — Patient Instructions (Signed)
We'll get labs today If you have not heard anything from my staff in a week about any orders/referrals/studies from today, please contact us here to follow-up [phone]  Try to limit saturated fats in your diet (bologna, hot dogs, barbeque, cheeseburgers, hamburgers, steak, bacon, sausage, cheese, etc.) and get more fresh fruits, vegetables, and whole grains  Try to follow the DASH guidelines (DASH stands for Dietary Approaches to Stop Hypertension) Try to limit the sodium in your diet.  Ideally, consume less than 1.5 grams (less than 1,500mg ) per day. Do not add salt when cooking or at the table.  Check the sodium amount on labels when shopping, and choose items lower in sodium when given a choice. Avoid or limit foods that already contain a lot of sodium. Eat a diet rich in fruits and vegetables and whole grains.

## 2015-12-24 NOTE — Assessment & Plan Note (Signed)
Check lipids today; limit saturated fats; continue statin 

## 2015-12-25 ENCOUNTER — Ambulatory Visit: Payer: Medicare Other | Admitting: Family Medicine

## 2015-12-29 NOTE — Assessment & Plan Note (Signed)
Managed by specialist; patient still a good historian

## 2016-02-03 ENCOUNTER — Encounter: Payer: Self-pay | Admitting: Cardiovascular Disease

## 2016-02-03 ENCOUNTER — Ambulatory Visit (INDEPENDENT_AMBULATORY_CARE_PROVIDER_SITE_OTHER): Payer: Medicare Other | Admitting: Cardiovascular Disease

## 2016-02-03 VITALS — BP 134/64 | HR 72 | Ht 68.0 in | Wt 158.8 lb

## 2016-02-03 DIAGNOSIS — I2510 Atherosclerotic heart disease of native coronary artery without angina pectoris: Secondary | ICD-10-CM

## 2016-02-03 DIAGNOSIS — E7800 Pure hypercholesterolemia, unspecified: Secondary | ICD-10-CM

## 2016-02-03 MED ORDER — ROSUVASTATIN CALCIUM 40 MG PO TABS: 40.0000 mg | ORAL_TABLET | Freq: Every day | ORAL | 5 refills | Status: DC

## 2016-02-03 NOTE — Progress Notes (Signed)
Cardiology Office Note   Date:  02/03/2016   ID:  Vincent Kennedy, DOB October 06, 1932, MRN [id number]  PCP:  Vincent Gouty, MD  Cardiologist:   Vincent Bears, MD   Chief Complaint  Patient presents with  . OTHER    6 month f/u c/o coughing and sob. Meds reviewed verbally with pt.      History of Present Illness: Vincent Kennedy is a 82 y.o. male who Is here today for follow-up visit regarding coronary artery disease. He is a retired Development worker, international aid . In 2008 when he presented with unstable angina. Cardiac catheterization showed 99% ostial first diagonal stenosis as well as mild left main and LAD disease. He underwent successful angioplasty and drug-eluting stent placement to the ostial first diagonal. He has not had any cardiac events since then. Most recent nuclear stress test in March 2016 was normal. He has been diagnosed with pulmonary fibrosis and currently on continuous oxygen therapy. This is being managed at [facility]. He attended pulmonary rehabilitation. He was having issues with intermittent tachycardia which correlated with hypoxia. He typically increases oxygen flow during exercise. At baseline, he is on 2 L nasal cannula and he has increased that to 4-5 L with exercise. He has not had any chest discomfort.   Past Medical History:  Diagnosis Date  . Abnormal nuclear cardiac imaging test    Dr. Kirke Kennedy  . Cataract   . Coronary artery disease    Cardiac cath September 2008: 20% left main stenosis, 40% mid LAD stenosis, 99% ostial first diagonal stenosis in a large branch and 90% ostial disease in second diagonal but the vessel was very small, 20% mid RCA stenosis with normal ejection fraction. Successful angioplasty and drug-eluting stent placement to the ostial first diagonal with a 2.5 x 15 mm Xience stent  . Hyperlipidemia   . Hypertension    BP has never been high(per pt).  Metoprolol is to keep BP low because of stent (afterload)  . Oxygen deficiency   . Pulmonary  fibrosis (HCC) 08/26/2015  . Shortness of breath dyspnea    with exertion  . Sleep apnea    has not used CPAP recently.  Feels condition has improved.  . Wears hearing aid    (sometimes)    Past Surgical History:  Procedure Laterality Date  . CARDIAC CATHETERIZATION     2010  . CATARACT EXTRACTION W/PHACO Right 01/29/2015   Procedure: CATARACT EXTRACTION PHACO AND INTRAOCULAR LENS PLACEMENT (IOC);  Surgeon: Vincent Mola, MD;  Location[facility];  Service: Ophthalmology;  Laterality: Right;  RESTOR LENS CPAP  . CATARACT EXTRACTION W/PHACO Left 03/12/2015   Procedure: CATARACT EXTRACTION PHACO AND INTRAOCULAR LENS PLACEMENT (IOC);  Surgeon: Vincent Mola, MD;  Location: [facility];  Service: Ophthalmology;  Laterality: Left;  RESTOR SHUGARCAINE  . COLONOSCOPY WITH ESOPHAGOGASTRODUODENOSCOPY (EGD)    . CORONARY ANGIOPLASTY  2010  . EYE SURGERY     cataract  . HERNIA REPAIR Bilateral    inguinal     Current Outpatient Prescriptions  Medication Sig Dispense Refill  . albuterol (PROVENTIL HFA;VENTOLIN HFA) 108 (90 Base) MCG/ACT inhaler Inhale 2 puffs into the lungs every 6 (six) hours as needed for wheezing or shortness of breath. 1 Inhaler 2  . aspirin EC 325 MG tablet Take 1 tablet (325 mg total) by mouth daily. 30 tablet 0  . carvedilol (COREG) 12.5 MG tablet Take 12.5 tablets by mouth daily.    [name] ipratropium-albuterol (DUONEB) 0.5-2.5 (3) MG/3ML SOLN  Take 3 mLs by nebulization every 6 (six) hours. 360 mL 2  . mirtazapine (REMERON) 15 MG tablet Take 15 mg by mouth at bedtime as needed.    . mometasone-formoterol (DULERA) 100-5 MCG/ACT AERO Inhale 2 puffs into the lungs 2 (two) times daily. 1 Inhaler 1  . pantoprazole (PROTONIX) 40 MG tablet Take 1 tablet (40 mg total) by mouth daily as needed. Caution:prolonged use may increase risk of pneumonia, colitis, osteoporosis, anemia 30 tablet 1  . Pirfenidone (ESBRIET) 267 MG CAPS Three pills by mouth three  times a day (per pulmonologist) 1 capsule   . rivastigmine (EXELON) 9.5 mg/24hr Place 1 patch (9.5 mg total) onto the skin daily. 30 patch 2  . rosuvastatin (CRESTOR) 20 MG tablet Take by mouth.    . zolpidem (AMBIEN) 10 MG tablet Take 5 mg by mouth at bedtime as needed.     No current facility-administered medications for this visit.     Allergies:   Patient has no known allergies.    Social History:  The patient  reports that he has never smoked. He has never used smokeless tobacco. He reports that he does not drink alcohol or use drugs.   Family History:  The patient's family history includes CAD in his brother and father.    ROS:  Please see the history of present illness.   Otherwise, review of systems are positive for none.   All other systems are reviewed and negative.    PHYSICAL EXAM: VS:  BP 134/64 (BP Location: Left Arm, Patient Position: Sitting, Cuff Size: Normal)   Pulse 72   Ht 5\' 8"  (1.727 m)   Wt 158 lb 12 oz (72 kg)   SpO2 98%   BMI 24.14 kg/m  , BMI Body mass index is 24.14 kg/m. GEN: Well nourished, well developed, in no acute distress  HEENT: normal  Neck: no JVD, carotid bruits, or masses Cardiac: RRR; no murmurs, rubs, or gallops,no edema  Respiratory:  clear to auscultation bilaterally, normal work of breathing GI: soft, nontender, nondistended, + BS MS: no deformity or atrophy  Skin: warm and dry, no rash Neuro:  Strength and sensation are intact Psych: euthymic mood, full affect   EKG:  EKG is ordered today. The ekg ordered today demonstrates sinus rhythm with PACs.   Recent Labs: 03/18/2015: B Natriuretic Peptide 49.0 12/24/2015: ALT 13; BUN 16; Creat 0.84; Hemoglobin 13.5; Platelets 190; Potassium 4.0; Sodium 141    Lipid Panel    Component Value Date/Time   CHOL 149 12/24/2015 1105   CHOL 187 08/26/2015 0931   TRIG 105 12/24/2015 1105   HDL 27 (L) 12/24/2015 1105   HDL 34 (L) 08/26/2015 0931   CHOLHDL 5.5 (H) 12/24/2015 1105   VLDL  21 12/24/2015 1105   LDLCALC 101 12/24/2015 1105   LDLCALC 131 (H) 08/26/2015 0931      Wt Readings from Last 3 Encounters:  02/03/16 158 lb 12 oz (72 kg)  12/24/15 159 lb (72.1 kg)  08/26/15 160 lb 8 oz (72.8 kg)     No flowsheet data found.    ASSESSMENT AND PLAN:  1.  Coronary artery disease involving native coronary arteries without angina: He is stable from a cardiac standpoint. Increased heart rate with hypoxia as expected. Continue medical therapy.  2. Hyperlipidemia: His lipid profile has worsened with most recent LDL of 101. I increased his simvastatin to 40 mg once daily.  3. Pulmonary fibrosis: Managed by [facility] pulmonary.    Disposition:  FU with me in 6 months  Signed,  Vincent BearsMuhammad Arida, MD  02/03/2016 3:20 PM    [facility]

## 2016-02-03 NOTE — Patient Instructions (Signed)
Medication Instructions:  Your physician has recommended you make the following change in your medication:  INCREASE rosuvastatin to 40mg  once daily   Labwork: none  Testing/Procedures: none  Follow-Up: Your physician wants you to follow-up in: 6 months with Dr. Kirke CorinArida.  You will receive a reminder letter in the mail two months in advance. If you don't receive a letter, please call our office to schedule the follow-up appointment.   Any Other Special Instructions Will Be Listed Below (If Applicable).     If you need a refill on your cardiac medications before your next appointment, please call your pharmacy.

## 2016-02-19 DIAGNOSIS — J9601 Acute respiratory failure with hypoxia: Secondary | ICD-10-CM | POA: Diagnosis not present

## 2016-03-09 DIAGNOSIS — K219 Gastro-esophageal reflux disease without esophagitis: Secondary | ICD-10-CM | POA: Diagnosis not present

## 2016-03-09 DIAGNOSIS — J8410 Pulmonary fibrosis, unspecified: Secondary | ICD-10-CM | POA: Diagnosis not present

## 2016-03-09 DIAGNOSIS — I2510 Atherosclerotic heart disease of native coronary artery without angina pectoris: Secondary | ICD-10-CM | POA: Diagnosis not present

## 2016-03-09 DIAGNOSIS — J849 Interstitial pulmonary disease, unspecified: Secondary | ICD-10-CM | POA: Diagnosis not present

## 2016-03-09 DIAGNOSIS — R0609 Other forms of dyspnea: Secondary | ICD-10-CM | POA: Diagnosis not present

## 2016-03-09 DIAGNOSIS — Z9989 Dependence on other enabling machines and devices: Secondary | ICD-10-CM | POA: Diagnosis not present

## 2016-03-09 DIAGNOSIS — Z79899 Other long term (current) drug therapy: Secondary | ICD-10-CM | POA: Diagnosis not present

## 2016-03-09 DIAGNOSIS — J84112 Idiopathic pulmonary fibrosis: Secondary | ICD-10-CM | POA: Diagnosis not present

## 2016-03-09 DIAGNOSIS — R911 Solitary pulmonary nodule: Secondary | ICD-10-CM | POA: Diagnosis not present

## 2016-03-09 DIAGNOSIS — R0600 Dyspnea, unspecified: Secondary | ICD-10-CM | POA: Diagnosis not present

## 2016-03-21 DIAGNOSIS — J9601 Acute respiratory failure with hypoxia: Secondary | ICD-10-CM | POA: Diagnosis not present

## 2016-04-21 DIAGNOSIS — J9601 Acute respiratory failure with hypoxia: Secondary | ICD-10-CM | POA: Diagnosis not present

## 2016-04-21 DIAGNOSIS — G4733 Obstructive sleep apnea (adult) (pediatric): Secondary | ICD-10-CM | POA: Diagnosis not present

## 2016-04-26 ENCOUNTER — Encounter: Payer: Self-pay | Admitting: Family Medicine

## 2016-04-26 ENCOUNTER — Ambulatory Visit (INDEPENDENT_AMBULATORY_CARE_PROVIDER_SITE_OTHER): Payer: Medicare Other | Admitting: Family Medicine

## 2016-04-26 DIAGNOSIS — I2510 Atherosclerotic heart disease of native coronary artery without angina pectoris: Secondary | ICD-10-CM | POA: Diagnosis not present

## 2016-04-26 DIAGNOSIS — J8410 Pulmonary fibrosis, unspecified: Secondary | ICD-10-CM | POA: Diagnosis not present

## 2016-04-26 DIAGNOSIS — E7800 Pure hypercholesterolemia, unspecified: Secondary | ICD-10-CM | POA: Diagnosis not present

## 2016-04-26 DIAGNOSIS — I10 Essential (primary) hypertension: Secondary | ICD-10-CM

## 2016-04-26 NOTE — Patient Instructions (Addendum)
Return in 6 months, sooner if any issues Try to limit saturated fats in your diet (bologna, hot dogs, barbeque, cheeseburgers, hamburgers, steak, bacon, sausage, cheese, etc.) and get more fresh fruits, vegetables, and whole grains

## 2016-04-26 NOTE — Assessment & Plan Note (Signed)
With low HDL; now managed by his cardiologist; Crestor was increased by him to 40 mg daily; no fasting labs here today to avoid duplication of care

## 2016-04-26 NOTE — Progress Notes (Signed)
BP 120/60   Pulse 84   Temp 98.1 F (36.7 C) (Oral)   Resp 16   Wt 155 lb 7 oz (70.5 kg)   SpO2 93%   BMI 23.63 kg/m    Subjective:    Patient ID: Vincent Kennedy, male    DOB: June 02, 1932, 83 y.o.   MRN: [id number]  HPI: Vincent Kennedy is a 83 y.o. male  Chief Complaint  Patient presents with  . Follow-up    4 month    Patient is here for f/u; he was going to have fasting labs, but his cardiologist just checked his cholesterol panel  Since last visit, he saw his pulmonologist at [facility], Dr. Lauris Chroman; they did pulmonary functions and other tests; he is using supplemental oxygen 24/7; a little deterioration; no home environment changes; his bedroom is on the first floor; he isn't sure about pneumonia vaccines  Blood pressure today is excellent; he does not check BP away from the doctor Systolic usually [phone]/[id number] Uses little bit of salt on food; wife not cooking with salt  CAD, followed by Dr. Fletcher Anon; he checked his cholesterol last time; low HDL and the medicine was adjusted, crestor now 40 mg daily; no muscle aches; taking full aspirin daily; no bleeding  On medicine for insomnia; rarely takes [place]; now on remeron by pulmologist, helped, sleeping 7-8 hours a night and refreshed  Depression screen [facility] 2/9 04/26/2016 12/24/2015 08/26/2015 04/22/2015 10/16/2014  Decreased Interest 0 0 0 0 0  Down, Depressed, Hopeless 0 0 0 0 0  PHQ - 2 Score 0 0 0 0 0  Altered sleeping - - - 0 -  Change in appetite - - - 0 -  Feeling bad or failure about yourself  - - - 0 -  Trouble concentrating - - - 0 -  Moving slowly or fidgety/restless - - - 3 -  Suicidal thoughts - - - 0 -  PHQ-9 Score - - - 3 -  Difficult doing work/chores - - - Somewhat difficult -   Relevant past medical, surgical, family and social history reviewed Past Medical History:  Diagnosis Date  . Abnormal nuclear cardiac imaging test    Dr. Fletcher Anon  . Cataract   . Coronary artery disease    Cardiac cath September  2008: 20% left main stenosis, 40% mid LAD stenosis, 99% ostial first diagonal stenosis in a large branch and 90% ostial disease in second diagonal but the vessel was very small, 20% mid RCA stenosis with normal ejection fraction. Successful angioplasty and drug-eluting stent placement to the ostial first diagonal with a 2.5 x 15 mm Xience stent  . Hyperlipidemia   . Hypertension    BP has never been high(per pt).  Metoprolol is to keep BP low because of stent (afterload)  . Oxygen deficiency   . Pulmonary fibrosis ([place]) 08/26/2015  . Shortness of breath dyspnea    with exertion  . Sleep apnea    has not used CPAP recently.  Feels condition has improved.  . Wears hearing aid    (sometimes)   Past Surgical History:  Procedure Laterality Date  . CARDIAC CATHETERIZATION     2010  . CATARACT EXTRACTION W/PHACO Right 01/29/2015   Procedure: CATARACT EXTRACTION PHACO AND INTRAOCULAR LENS PLACEMENT (IOC);  Surgeon: Leandrew Koyanagi, MD;  Location: [facility];  Service: Ophthalmology;  Laterality: Right;  RESTOR LENS CPAP  . CATARACT EXTRACTION W/PHACO Left 03/12/2015   Procedure: CATARACT EXTRACTION PHACO AND INTRAOCULAR LENS PLACEMENT (  [place]);  Surgeon: Leandrew Koyanagi, MD;  Location: [facility];  Service: Ophthalmology;  Laterality: Left;  RESTOR SHUGARCAINE  . COLONOSCOPY WITH ESOPHAGOGASTRODUODENOSCOPY (EGD)    . CORONARY ANGIOPLASTY  2010  . EYE SURGERY     cataract  . HERNIA REPAIR Bilateral    inguinal   Family History  Problem Relation Age of Onset  . CAD Father   . CAD Brother    Social History  Substance Use Topics  . Smoking status: Never Smoker  . Smokeless tobacco: Never Used  . Alcohol use No    Interim medical history since last visit reviewed. Allergies and medications reviewed  Review of Systems  Cardiovascular: Negative for chest pain.  Hematological: Does not bruise/bleed easily.   Per HPI unless specifically indicated above       Objective:    BP 120/60   Pulse 84   Temp 98.1 F (36.7 C) (Oral)   Resp 16   Wt 155 lb 7 oz (70.5 kg)   SpO2 93%   BMI 23.63 kg/m   Wt Readings from Last 3 Encounters:  04/26/16 155 lb 7 oz (70.5 kg)  02/03/16 158 lb 12 oz (72 kg)  12/24/15 159 lb (72.1 kg)    Physical Exam  Constitutional: He appears well-developed and well-nourished. No distress.  HENT:  Head: Normocephalic and atraumatic.  Wearing supplemental oxygen via [place], portable O2  Eyes: EOM are normal. No scleral icterus.  Neck: No JVD present. No thyromegaly present.  Cardiovascular: Normal rate and regular rhythm.   Pulmonary/Chest: Effort normal. He has decreased breath sounds.  Some kyphoscoliosis noted  Abdominal: Soft. Bowel sounds are normal. He exhibits no distension.  Musculoskeletal: He exhibits no edema.  Neurological: Coordination normal.  Skin: Skin is warm and dry. No pallor.  Psychiatric: He has a normal mood and affect. His mood appears not anxious. He does not exhibit a depressed mood.   Results for orders placed or performed in visit on 12/24/15  CBC with Differential/Platelet  Result Value Ref Range   WBC 8.1 3.8 - 10.8 K/uL   RBC 4.63 4.20 - 5.80 MIL/uL   Hemoglobin 13.5 13.2 - 17.1 g/dL   HCT 42.1 38.5 - 50.0 %   MCV 90.9 80.0 - 100.0 fL   MCH 29.2 27.0 - 33.0 pg   MCHC 32.1 32.0 - 36.0 g/dL   RDW 13.8 11.0 - 15.0 %   Platelets 190 140 - 400 K/uL   MPV 8.0 7.5 - 12.5 fL   Neutro Abs 5,022 1,500 - 7,800 cells/uL   Lymphs Abs 1,944 850 - 3,900 cells/uL   Monocytes Absolute 648 200 - 950 cells/uL   Eosinophils Absolute 405 15 - 500 cells/uL   Basophils Absolute 81 0 - 200 cells/uL   Neutrophils Relative % 62 %   Lymphocytes Relative 24 %   Monocytes Relative 8 %   Eosinophils Relative 5 %   Basophils Relative 1 %   Smear Review Criteria for review not met   Lipid panel  Result Value Ref Range   Cholesterol 149 125 - 200 mg/dL   Triglycerides 105 <150 mg/dL   HDL 27 (L) >=40  mg/dL   Total CHOL/HDL Ratio 5.5 (H) <=5.0 Ratio   VLDL 21 <30 mg/dL   LDL Cholesterol 101 <130 mg/dL  COMPLETE METABOLIC PANEL WITH GFR  Result Value Ref Range   Sodium 141 135 - 146 mmol/L   Potassium 4.0 3.5 - 5.3 mmol/L   Chloride 106 98 -  110 mmol/L   CO2 25 20 - 31 mmol/L   Glucose, Bld 82 65 - 99 mg/dL   BUN 16 7 - 25 mg/dL   Creat 0.84 0.70 - 1.11 mg/dL   Total Bilirubin 0.4 0.2 - 1.2 mg/dL   Alkaline Phosphatase 55 40 - 115 U/L   AST 23 10 - 35 U/L   ALT 13 9 - 46 U/L   Total Protein 7.4 6.1 - 8.1 g/dL   Albumin 3.5 (L) 3.6 - 5.1 g/dL   Calcium 8.8 8.6 - 10.3 mg/dL   GFR, Est African American >89 >=60 mL/min   GFR, Est Non African American 82 >=60 mL/min      Assessment & Plan:   Problem List Items Addressed This Visit      Cardiovascular and Mediastinum   Hypertension goal BP (blood pressure) < 150/90 (Chronic)    controlled      Coronary artery disease involving native coronary artery of native heart without angina pectoris (Chronic)    Managed by cardiologist, Dr. Fletcher Anon; sx free        Respiratory   Pulmonary fibrosis ([place]) (Chronic)    Managed by pulmonologist; on chronic supplemental O2; patient appears to be in need of pneumonia vaccines; I will ask my staff to contact Dr. Evalee Mutton office and Dr. Roney Mans office to see which, if any, pneumonia vaccines he has had, then bring him back in if one is needed; influenza vaccine UTD this year        Other   Hyperlipidemia (Chronic)    With low HDL; now managed by his cardiologist; Crestor was increased by him to 40 mg daily; no fasting labs here today to avoid duplication of care          Follow up plan: Return in about 6 months (around 10/24/2016).  An after-visit summary was printed and given to the patient at [facility].  Please see the patient instructions which may contain other information and recommendations beyond what is mentioned above in the assessment and plan.  No orders of the defined  types were placed in this encounter.   No orders of the defined types were placed in this encounter.

## 2016-04-26 NOTE — Assessment & Plan Note (Addendum)
Managed by pulmonologist; on chronic supplemental O2; patient appears to be in need of pneumonia vaccines; I will ask my staff to contact Dr. Mercie EonGilstrap's office and Dr. Willy EddySaddat Khan's office to see which, if any, pneumonia vaccines he has had, then bring him back in if one is needed; influenza vaccine UTD this year

## 2016-04-26 NOTE — Assessment & Plan Note (Signed)
Managed by cardiologist, Dr. Kirke CorinArida; sx free

## 2016-04-26 NOTE — Assessment & Plan Note (Signed)
controlled 

## 2016-04-28 ENCOUNTER — Telehealth: Payer: Self-pay | Admitting: Family Medicine

## 2016-04-28 DIAGNOSIS — Z23 Encounter for immunization: Secondary | ICD-10-CM

## 2016-04-28 NOTE — Telephone Encounter (Signed)
-----   Message from [facility] Nichole Parrish, [place] sent at 04/28/2016  2:57 PM EST ----- Regarding: RE: check on pneumonia vaccines with pulm Finally received a call back from Dr. Welton FlakesKhan office and the only vaccine they give are the flu therefore he has not had the PCV 13. I will call him and invite him to our office for the PCV 13 shot.  ----- Message ----- From: Kerman PasseyMelinda P Lada, MD Sent: 04/26/2016   6:01 PM To: Amber Verline LemaNichole Parrish, [place] Subject: RE: check on pneumonia vaccines with pulm      Thank you. If you could please update his health maintenance with the PPSV-23 info. Could you also double check with his other pulmonary doctor, Dr. Welton FlakesKhan? (Just in case he got a vaccine there.) If no one else has given him PCV-13, and we didn't give it in the old system here, please invite him in for the PCV-13. Thank you!  ----- Message ----- From: Delanna NoticeAmber Nichole Parrish, CMA Sent: 04/26/2016   1:41 PM To: Kerman PasseyMelinda P Lada, MD Subject: RE: check on pneumonia vaccines with pulm      Checked the care everywhere tab and called Dr.Daniel Delton SeeGilstrap [facility] office in [place]  and confirmed with his nurse the Patient had the PPSV23 but he needs the PCV 13. ----- Message ----- From: Kerman PasseyMelinda P Lada, MD Sent: 04/26/2016  10:10 AM To: Delanna NoticeAmber Nichole Parrish, CMA Subject: check on pneumonia vaccines with pulm          Please call pulm office, verify pneumonia vaccines; patient has also seen Dr. Nickolas MadridSadat Khan in the past as well

## 2016-04-28 NOTE — Telephone Encounter (Signed)
I will have to call again tomorrow, his nurse was unavailable.

## 2016-04-28 NOTE — Telephone Encounter (Signed)
The record from [facility] shows PCV-13 in February 2017 He does not another PCV-13 Please confirm with nurse at Dr. Mercie EonGilstrap's office in their records has he ever gotten a Pneumovax (PPSV-23)? Thank you so much

## 2016-04-29 NOTE — Telephone Encounter (Signed)
Routing this back to you

## 2016-05-03 NOTE — Telephone Encounter (Signed)
-----   Message -----  From: Delanna NoticeAmber Nichole Lennell Shanks, CMA  Sent: 04/29/2016  9:35 AM  To: Kerman PasseyMelinda P Lada, MD  Subject: FW: check on pneumonia vaccines with pulm     I was able to get to tracy Dr. Glynda JaegerGilstrap nurse and the pt had PCV 13 on May 13 2015. He will need the PPSV 23. Its updated in the system.                 Kerman PasseyMelinda P Lada, MD  Emiliana Blaize Verline LemaNichole Exilda Wilhite, CMA   Note sent to me : 04/30/2016      Randie HeinzGreat! If you could document that in the open phone note and contact him to come in late this month or in March, that would be awesome.    ----------------------------------------------------------------------------------------------------------- Called and notified the patient he will need the PPSV 23. Mention to the patient that he can come in late february and or march, which will be a year from his PCV 13. Patient understood.

## 2016-05-13 NOTE — Telephone Encounter (Signed)
CMA has contacted patient; I am closing this note

## 2016-05-19 DIAGNOSIS — J9601 Acute respiratory failure with hypoxia: Secondary | ICD-10-CM | POA: Diagnosis not present

## 2016-05-19 DIAGNOSIS — G4733 Obstructive sleep apnea (adult) (pediatric): Secondary | ICD-10-CM | POA: Diagnosis not present

## 2016-06-10 ENCOUNTER — Other Ambulatory Visit: Payer: Self-pay | Admitting: Cardiovascular Disease

## 2016-06-10 MED ORDER — ROSUVASTATIN CALCIUM 40 MG PO TABS: 40.0000 mg | ORAL_TABLET | Freq: Every day | ORAL | 1 refills | Status: DC

## 2016-06-19 DIAGNOSIS — J9601 Acute respiratory failure with hypoxia: Secondary | ICD-10-CM | POA: Diagnosis not present

## 2016-06-19 DIAGNOSIS — G4733 Obstructive sleep apnea (adult) (pediatric): Secondary | ICD-10-CM | POA: Diagnosis not present

## 2016-07-19 DIAGNOSIS — J9601 Acute respiratory failure with hypoxia: Secondary | ICD-10-CM | POA: Diagnosis not present

## 2016-07-19 DIAGNOSIS — G4733 Obstructive sleep apnea (adult) (pediatric): Secondary | ICD-10-CM | POA: Diagnosis not present

## 2016-07-20 DIAGNOSIS — J189 Pneumonia, unspecified organism: Secondary | ICD-10-CM

## 2016-07-20 HISTORY — DX: Pneumonia, unspecified organism: J18.9

## 2016-07-27 DIAGNOSIS — G4733 Obstructive sleep apnea (adult) (pediatric): Secondary | ICD-10-CM | POA: Diagnosis not present

## 2016-07-27 DIAGNOSIS — R0602 Shortness of breath: Secondary | ICD-10-CM | POA: Diagnosis not present

## 2016-07-27 DIAGNOSIS — J84112 Idiopathic pulmonary fibrosis: Secondary | ICD-10-CM | POA: Diagnosis not present

## 2016-08-02 DIAGNOSIS — R0602 Shortness of breath: Secondary | ICD-10-CM | POA: Diagnosis not present

## 2016-08-09 DIAGNOSIS — R0602 Shortness of breath: Secondary | ICD-10-CM | POA: Diagnosis not present

## 2016-08-10 ENCOUNTER — Ambulatory Visit
Admission: RE | Admit: 2016-08-10 | Discharge: 2016-08-10 | Disposition: A | Discharge status: Home / Self Care | Payer: Medicare Other | Source: Ambulatory Visit | Attending: Internal Medicine | Admitting: Internal Medicine

## 2016-08-10 ENCOUNTER — Other Ambulatory Visit: Payer: Self-pay | Admitting: Internal Medicine

## 2016-08-10 DIAGNOSIS — G4733 Obstructive sleep apnea (adult) (pediatric): Secondary | ICD-10-CM | POA: Diagnosis not present

## 2016-08-10 DIAGNOSIS — J849 Interstitial pulmonary disease, unspecified: Secondary | ICD-10-CM | POA: Diagnosis not present

## 2016-08-10 DIAGNOSIS — R059 Cough, unspecified: Secondary | ICD-10-CM

## 2016-08-10 DIAGNOSIS — R05 Cough: Secondary | ICD-10-CM

## 2016-08-10 DIAGNOSIS — I700 Atherosclerosis of aorta: Secondary | ICD-10-CM | POA: Diagnosis not present

## 2016-08-10 DIAGNOSIS — I517 Cardiomegaly: Secondary | ICD-10-CM | POA: Diagnosis not present

## 2016-08-10 DIAGNOSIS — J209 Acute bronchitis, unspecified: Secondary | ICD-10-CM | POA: Diagnosis not present

## 2016-08-15 ENCOUNTER — Emergency Department: Payer: Medicare Other

## 2016-08-15 ENCOUNTER — Inpatient Hospital Stay
Admission: EM | Admit: 2016-08-15 | Discharge: 2016-08-19 | DRG: 193 | Disposition: A | Discharge status: Home / Self Care | Payer: Medicare Other | Attending: Internal Medicine | Admitting: Internal Medicine

## 2016-08-15 DIAGNOSIS — J189 Pneumonia, unspecified organism: Secondary | ICD-10-CM | POA: Diagnosis not present

## 2016-08-15 DIAGNOSIS — I10 Essential (primary) hypertension: Secondary | ICD-10-CM | POA: Diagnosis not present

## 2016-08-15 DIAGNOSIS — J440 Chronic obstructive pulmonary disease with acute lower respiratory infection: Secondary | ICD-10-CM | POA: Diagnosis present

## 2016-08-15 DIAGNOSIS — Z955 Presence of coronary angioplasty implant and graft: Secondary | ICD-10-CM

## 2016-08-15 DIAGNOSIS — J9621 Acute and chronic respiratory failure with hypoxia: Secondary | ICD-10-CM | POA: Diagnosis not present

## 2016-08-15 DIAGNOSIS — J8410 Pulmonary fibrosis, unspecified: Secondary | ICD-10-CM | POA: Diagnosis not present

## 2016-08-15 DIAGNOSIS — R339 Retention of urine, unspecified: Secondary | ICD-10-CM | POA: Diagnosis not present

## 2016-08-15 DIAGNOSIS — H269 Unspecified cataract: Secondary | ICD-10-CM | POA: Diagnosis present

## 2016-08-15 DIAGNOSIS — R0600 Dyspnea, unspecified: Secondary | ICD-10-CM

## 2016-08-15 DIAGNOSIS — I25110 Atherosclerotic heart disease of native coronary artery with unstable angina pectoris: Secondary | ICD-10-CM | POA: Diagnosis present

## 2016-08-15 DIAGNOSIS — E785 Hyperlipidemia, unspecified: Secondary | ICD-10-CM | POA: Diagnosis not present

## 2016-08-15 DIAGNOSIS — J159 Unspecified bacterial pneumonia: Principal | ICD-10-CM | POA: Diagnosis present

## 2016-08-15 DIAGNOSIS — Z7982 Long term (current) use of aspirin: Secondary | ICD-10-CM

## 2016-08-15 DIAGNOSIS — I272 Pulmonary hypertension, unspecified: Secondary | ICD-10-CM | POA: Diagnosis not present

## 2016-08-15 DIAGNOSIS — J9601 Acute respiratory failure with hypoxia: Secondary | ICD-10-CM | POA: Diagnosis not present

## 2016-08-15 DIAGNOSIS — I25119 Atherosclerotic heart disease of native coronary artery with unspecified angina pectoris: Secondary | ICD-10-CM | POA: Diagnosis not present

## 2016-08-15 DIAGNOSIS — I5030 Unspecified diastolic (congestive) heart failure: Secondary | ICD-10-CM | POA: Diagnosis not present

## 2016-08-15 DIAGNOSIS — Z792 Long term (current) use of antibiotics: Secondary | ICD-10-CM

## 2016-08-15 DIAGNOSIS — R0602 Shortness of breath: Secondary | ICD-10-CM | POA: Diagnosis not present

## 2016-08-15 DIAGNOSIS — R338 Other retention of urine: Secondary | ICD-10-CM | POA: Diagnosis present

## 2016-08-15 DIAGNOSIS — G4733 Obstructive sleep apnea (adult) (pediatric): Secondary | ICD-10-CM | POA: Diagnosis not present

## 2016-08-15 DIAGNOSIS — K219 Gastro-esophageal reflux disease without esophagitis: Secondary | ICD-10-CM | POA: Diagnosis not present

## 2016-08-15 DIAGNOSIS — Z7952 Long term (current) use of systemic steroids: Secondary | ICD-10-CM | POA: Diagnosis not present

## 2016-08-15 DIAGNOSIS — J9620 Acute and chronic respiratory failure, unspecified whether with hypoxia or hypercapnia: Secondary | ICD-10-CM | POA: Diagnosis not present

## 2016-08-15 LAB — CBC WITH DIFFERENTIAL/PLATELET
BASOS ABS: 0 10*3/uL (ref 0–0.1)
BASOS PCT: 0 %
Eosinophils Absolute: 0.2 10*3/uL (ref 0–0.7)
Eosinophils Relative: 1 %
HEMATOCRIT: 40.6 % (ref 40.0–52.0)
HEMOGLOBIN: 13.7 g/dL (ref 13.0–18.0)
LYMPHS PCT: 10 %
Lymphs Abs: 1.3 10*3/uL (ref 1.0–3.6)
MCH: 30.1 pg (ref 26.0–34.0)
MCHC: 33.8 g/dL (ref 32.0–36.0)
MCV: 89.1 fL (ref 80.0–100.0)
Monocytes Absolute: 1.4 10*3/uL — ABNORMAL HIGH (ref 0.2–1.0)
Monocytes Relative: 10 %
NEUTROS PCT: 79 %
Neutro Abs: 11.2 10*3/uL — ABNORMAL HIGH (ref 1.4–6.5)
Platelets: 243 10*3/uL (ref 150–440)
RBC: 4.55 MIL/uL (ref 4.40–5.90)
RDW: 14.2 % (ref 11.5–14.5)
WBC: 14.1 10*3/uL — ABNORMAL HIGH (ref 3.8–10.6)

## 2016-08-15 LAB — COMPREHENSIVE METABOLIC PANEL
ALBUMIN: 3.6 g/dL (ref 3.5–5.0)
ALT: 26 U/L (ref 17–63)
ANION GAP: 7 (ref 5–15)
AST: 38 U/L (ref 15–41)
Alkaline Phosphatase: 61 U/L (ref 38–126)
BUN: 11 mg/dL (ref 6–20)
CHLORIDE: 104 mmol/L (ref 101–111)
CO2: 27 mmol/L (ref 22–32)
Calcium: 8.7 mg/dL — ABNORMAL LOW (ref 8.9–10.3)
Creatinine, Ser: 0.81 mg/dL (ref 0.61–1.24)
GFR calc non Af Amer: >60 mL/min (ref >60)
GLUCOSE: 106 mg/dL — AB (ref 65–99)
Potassium: 3.5 mmol/L (ref 3.5–5.1)
SODIUM: 138 mmol/L (ref 135–145)
Total Bilirubin: 0.7 mg/dL (ref 0.3–1.2)
Total Protein: 8.7 g/dL — ABNORMAL HIGH (ref 6.5–8.1)

## 2016-08-15 LAB — LACTIC ACID, PLASMA: LACTIC ACID, VENOUS: 1 mmol/L (ref 0.5–1.9)

## 2016-08-15 LAB — TROPONIN I: Troponin I: <0.03 ng/mL (ref <0.03)

## 2016-08-15 LAB — BRAIN NATRIURETIC PEPTIDE: B NATRIURETIC PEPTIDE 5: 98 pg/mL (ref 0.0–100.0)

## 2016-08-15 LAB — LACTATE DEHYDROGENASE: LDH: 196 U/L — ABNORMAL HIGH (ref 98–192)

## 2016-08-15 MED ORDER — MIRTAZAPINE 15 MG PO TABS
15.0000 mg | ORAL_TABLET | Freq: Every day | ORAL | Status: DC
  Administered 2016-08-15 – 2016-08-18 (×4): 15 mg via ORAL
  Filled 2016-08-15 (×4): qty 1

## 2016-08-15 MED ORDER — ACETAMINOPHEN 325 MG PO TABS: 650.0000 mg | ORAL_TABLET | Freq: Four times a day (QID) | ORAL | Status: DC | PRN

## 2016-08-15 MED ORDER — BUDESONIDE 0.5 MG/2ML IN SUSP
0.5000 mg | Freq: Two times a day (BID) | RESPIRATORY_TRACT | Status: DC
  Administered 2016-08-15 – 2016-08-19 (×7): 0.5 mg via RESPIRATORY_TRACT
  Filled 2016-08-15 (×8): qty 2

## 2016-08-15 MED ORDER — ENOXAPARIN SODIUM 40 MG/0.4ML ~~LOC~~ SOLN
40.0000 mg | SUBCUTANEOUS | Status: DC
  Administered 2016-08-15 – 2016-08-18 (×4): 40 mg via SUBCUTANEOUS
  Filled 2016-08-15 (×4): qty 0.4

## 2016-08-15 MED ORDER — ONDANSETRON HCL 4 MG/2ML IJ SOLN: 4.0000 mg | Freq: Four times a day (QID) | INTRAMUSCULAR | Status: DC | PRN

## 2016-08-15 MED ORDER — CIPROFLOXACIN HCL 0.3 % OP SOLN
2.0000 [drp] | OPHTHALMIC | Status: DC
  Administered 2016-08-15 – 2016-08-19 (×18): 2 [drp] via OPHTHALMIC
  Filled 2016-08-15: qty 2.5

## 2016-08-15 MED ORDER — ROSUVASTATIN CALCIUM 20 MG PO TABS
40.0000 mg | ORAL_TABLET | Freq: Every day | ORAL | Status: DC
  Administered 2016-08-15 – 2016-08-19 (×5): 40 mg via ORAL
  Filled 2016-08-15 (×5): qty 2

## 2016-08-15 MED ORDER — ZOLPIDEM TARTRATE 5 MG PO TABS
5.0000 mg | ORAL_TABLET | Freq: Every evening | ORAL | Status: DC | PRN
  Administered 2016-08-17 – 2016-08-18 (×3): 5 mg via ORAL
  Filled 2016-08-15 (×3): qty 1

## 2016-08-15 MED ORDER — IPRATROPIUM-ALBUTEROL 0.5-2.5 (3) MG/3ML IN SOLN
3.0000 mL | RESPIRATORY_TRACT | Status: DC
  Administered 2016-08-15 – 2016-08-18 (×11): 3 mL via RESPIRATORY_TRACT
  Filled 2016-08-15 (×14): qty 3

## 2016-08-15 MED ORDER — CEFTRIAXONE SODIUM IN DEXTROSE 20 MG/ML IV SOLN
1.0000 g | Freq: Once | INTRAVENOUS | Status: AC
  Administered 2016-08-15: 1 g via INTRAVENOUS
  Filled 2016-08-15: qty 50

## 2016-08-15 MED ORDER — PANTOPRAZOLE SODIUM 40 MG PO TBEC
40.0000 mg | DELAYED_RELEASE_TABLET | Freq: Every day | ORAL | Status: DC
  Administered 2016-08-15 – 2016-08-18 (×4): 40 mg via ORAL
  Filled 2016-08-15 (×4): qty 1

## 2016-08-15 MED ORDER — CARVEDILOL 6.25 MG PO TABS
12.5000 mg | ORAL_TABLET | Freq: Two times a day (BID) | ORAL | Status: DC
  Administered 2016-08-15 – 2016-08-16 (×2): 12.5 mg via ORAL
  Filled 2016-08-15 (×2): qty 2

## 2016-08-15 MED ORDER — DEXTROSE 5 % IV SOLN
1.0000 g | INTRAVENOUS | Status: DC
  Administered 2016-08-16 – 2016-08-18 (×3): 1 g via INTRAVENOUS
  Filled 2016-08-15 (×4): qty 10

## 2016-08-15 MED ORDER — DEXTROSE 5 % IV SOLN
500.0000 mg | Freq: Once | INTRAVENOUS | Status: AC
  Administered 2016-08-15: 500 mg via INTRAVENOUS
  Filled 2016-08-15: qty 500

## 2016-08-15 MED ORDER — ASPIRIN EC 325 MG PO TBEC
325.0000 mg | DELAYED_RELEASE_TABLET | Freq: Every day | ORAL | Status: DC
  Administered 2016-08-15: 21:00:00 325 mg via ORAL
  Filled 2016-08-15: qty 1

## 2016-08-15 MED ORDER — ONDANSETRON HCL 4 MG PO TABS: 4.0000 mg | ORAL_TABLET | Freq: Four times a day (QID) | ORAL | Status: DC | PRN

## 2016-08-15 MED ORDER — ORAL CARE MOUTH RINSE
15.0000 mL | Freq: Two times a day (BID) | OROMUCOSAL | Status: DC
  Administered 2016-08-15 – 2016-08-18 (×7): 15 mL via OROMUCOSAL

## 2016-08-15 MED ORDER — AZITHROMYCIN 250 MG PO TABS
250.0000 mg | ORAL_TABLET | Freq: Every day | ORAL | Status: DC
  Administered 2016-08-16 – 2016-08-17 (×2): 250 mg via ORAL
  Filled 2016-08-15 (×2): qty 1

## 2016-08-15 MED ORDER — IOPAMIDOL (ISOVUE-370) INJECTION 76%
75.0000 mL | Freq: Once | INTRAVENOUS | Status: AC | PRN
  Administered 2016-08-15: 75 mL via INTRAVENOUS

## 2016-08-15 MED ORDER — ACETAMINOPHEN 650 MG RE SUPP: 650.0000 mg | Freq: Four times a day (QID) | RECTAL | Status: DC | PRN

## 2016-08-15 MED ORDER — PIRFENIDONE 267 MG PO CAPS
801.0000 mg | ORAL_CAPSULE | Freq: Three times a day (TID) | ORAL | Status: DC
  Administered 2016-08-15 – 2016-08-19 (×11): 801 mg via ORAL
  Filled 2016-08-15 (×8): qty 3

## 2016-08-15 MED ORDER — METHYLPREDNISOLONE SODIUM SUCC 40 MG IJ SOLR
40.0000 mg | Freq: Four times a day (QID) | INTRAMUSCULAR | Status: DC
  Administered 2016-08-15 – 2016-08-17 (×7): 40 mg via INTRAVENOUS
  Filled 2016-08-15 (×7): qty 1

## 2016-08-15 NOTE — ED Triage Notes (Addendum)
PT sent over from Dr. Welton FlakesKhan office for CT of chest. Dr. Darnelle CatalanMalinda spoke with Dr and is at bedside.  Pt arrives on 3L [place] that he wears chronically.

## 2016-08-15 NOTE — ED Notes (Addendum)
ED Provider at bedside. 

## 2016-08-15 NOTE — H&P (Signed)
[facility] [place] [facility]   PATIENT NAME: Vincent Kennedy    MR#:  [id number]  DATE OF BIRTH:  1932-06-08  DATE OF ADMISSION:  08/15/2016  PRIMARY CARE PHYSICIAN: Yevonne Pax, MD   REQUESTING/REFERRING PHYSICIAN: Dr. Dorothea Glassman  CHIEF COMPLAINT:   Chief Complaint  Patient presents with  . Shortness of Breath    HISTORY OF PRESENT ILLNESS:  Vincent Kennedy  is a 83 y.o. male with a known history of Coronary artery disease status post stents, hypertension, hyperlipidemia, chronic lung disease secondary to pulmonary fibrosis, chronic respiratory failure who presents to the hospital due to shortness of breath. Patient has been feeling short of breath for the past 3-4 days progressively getting worse. He has chronic respiratory failure and is on 2 L oxygen continuously but had to increase it to 3-4 L over the past few days. He also admits to cough which is productive with clear and intermittently yellow sputum. He denies any chills or fever home but was noted to have a low-grade fever 100.4 here in the ER. His CT scan of the chest was suggestive of right lower lobe pneumonia. Given his history of chronic lung disease and worsening respiratory failure hospitalist services were contacted further treatment and evaluation. Has any chest pain, abdominal pain, nausea, vomiting or any other associated symptoms presently.  PAST MEDICAL HISTORY:   Past Medical History:  Diagnosis Date  . Abnormal nuclear cardiac imaging test    Dr. [name]  . Cataract   . Coronary artery disease    Cardiac cath September 2008: 20% left main stenosis, 40% mid LAD stenosis, 99% ostial first diagonal stenosis in a large branch and 90% ostial disease in second diagonal but the vessel was very small, 20% mid RCA stenosis with normal ejection fraction. Successful angioplasty and drug-eluting stent placement to the ostial first diagonal with a 2.5 x 15 mm Xience stent  . Hyperlipidemia   .  Hypertension    BP has never been high(per pt).  Metoprolol is to keep BP low because of stent (afterload)  . Oxygen deficiency   . Pulmonary fibrosis ([facility]) 08/26/2015  . Shortness of breath dyspnea    with exertion  . Sleep apnea    has not used CPAP recently.  Feels condition has improved.  . Wears hearing aid    (sometimes)    PAST SURGICAL HISTORY:   Past Surgical History:  Procedure Laterality Date  . CARDIAC CATHETERIZATION     2010  . CATARACT EXTRACTION W/PHACO Right 01/29/2015   Procedure: CATARACT EXTRACTION PHACO AND INTRAOCULAR LENS PLACEMENT (IOC);  Surgeon: Lockie Mola, MD;  Location: [facility];  Service: Ophthalmology;  Laterality: Right;  RESTOR LENS CPAP  . CATARACT EXTRACTION W/PHACO Left 03/12/2015   Procedure: CATARACT EXTRACTION PHACO AND INTRAOCULAR LENS PLACEMENT (IOC);  Surgeon: Lockie Mola, MD;  Location: [facility];  Service: Ophthalmology;  Laterality: Left;  RESTOR SHUGARCAINE  . COLONOSCOPY WITH ESOPHAGOGASTRODUODENOSCOPY (EGD)    . CORONARY ANGIOPLASTY  2010  . EYE SURGERY     cataract  . HERNIA REPAIR Bilateral    inguinal    SOCIAL HISTORY:   Social History  Substance Use Topics  . Smoking status: Never Smoker  . Smokeless tobacco: Never Used  . Alcohol use No    FAMILY HISTORY:   Family History  Problem Relation Age of Onset  . CAD Father   . CAD Brother     DRUG ALLERGIES:  No Known Allergies  REVIEW OF SYSTEMS:   Review of Systems  Constitutional: Negative for fever and weight loss.  HENT: Negative for congestion, nosebleeds and tinnitus.   Eyes: Negative for blurred vision, double vision and redness.  Respiratory: Positive for cough, sputum production, shortness of breath and wheezing. Negative for hemoptysis.   Cardiovascular: Negative for chest pain, orthopnea, leg swelling and PND.  Gastrointestinal: Negative for abdominal pain, diarrhea, melena, nausea and vomiting.  Genitourinary:  Negative for dysuria, hematuria and urgency.  Musculoskeletal: Negative for falls and joint pain.  Neurological: Negative for dizziness, tingling, sensory change, focal weakness, seizures, weakness and headaches.  Endo/Heme/Allergies: Negative for polydipsia. Does not bruise/bleed easily.  Psychiatric/Behavioral: Negative for depression and memory loss. The patient is not nervous/anxious.     MEDICATIONS AT HOME:   Prior to Admission medications   Medication Sig Start Date End Date Taking? Authorizing Provider  albuterol (PROVENTIL HFA;VENTOLIN HFA) 108 (90 Base) MCG/ACT inhaler Inhale 2 puffs into the lungs every 6 (six) hours as needed for wheezing or shortness of breath. 03/22/15  Yes Alford Highland, MD  aspirin EC 325 MG tablet Take 1 tablet (325 mg total) by mouth daily. 12/24/15  Yes Lada, Janit Bern, MD  carvedilol (COREG) 12.5 MG tablet Take 12.5 tablets by mouth daily.   Yes [provider]  ipratropium-albuterol (DUONEB) 0.5-2.5 (3) MG/3ML SOLN Take 3 mLs by nebulization every 6 (six) hours. 03/22/15  Yes Wieting, Richard, MD  levofloxacin (LEVAQUIN) 500 MG tablet Take 500 mg by mouth daily.   Yes [provider]  mirtazapine (REMERON) 15 MG tablet Take 15 mg by mouth at bedtime.  11/17/15 11/16/16 Yes [provider]  mometasone-formoterol (DULERA) 100-5 MCG/ACT AERO Inhale 2 puffs into the lungs 2 (two) times daily. 04/11/15  Yes Edwena Felty, MD  pantoprazole (PROTONIX) 40 MG tablet Take 1 tablet (40 mg total) by mouth daily as needed. Caution:prolonged use may increase risk of pneumonia, colitis, osteoporosis, anemia Patient taking differently: Take 40 mg by mouth daily. Caution:prolonged use may increase risk of pneumonia, colitis, osteoporosis, anemi 08/26/15  Yes Lada, Janit Bern, MD  Pirfenidone (ESBRIET) 267 MG CAPS Take 267 mg by mouth 3 (three) times daily.   Yes [provider]  predniSONE (DELTASONE) 20 MG tablet Take 20 mg by mouth 3  (three) times daily.   Yes [provider]  rivastigmine (EXELON) 9.5 mg/24hr Place 1 patch (9.5 mg total) onto the skin daily. 08/26/15  Yes Lada, Janit Bern, MD  rosuvastatin (CRESTOR) 40 MG tablet Take 1 tablet (40 mg total) by mouth daily. 06/10/16  Yes Iran Ouch, MD  zolpidem (AMBIEN) 5 MG tablet Take 5 mg by mouth at bedtime as needed. 08/07/14  Yes [provider]      VITAL SIGNS:  Blood pressure 128/72, pulse 77, temperature (!) 100.4 F (38 C), temperature source Oral, resp. rate (!) 28, weight 70.3 kg (155 lb), SpO2 98 %.  PHYSICAL EXAMINATION:  Physical Exam  GENERAL:  83 y.o.-year-old patient lying in bed in mild Resp. distress.  EYES: Pupils equal, round, reactive to light and accommodation. No scleral icterus. Extraocular muscles intact.  HEENT: Head atraumatic, normocephalic. Oropharynx and nasopharynx clear. No oropharyngeal erythema, moist oral mucosa  NECK:  Supple, no jugular venous distention. No thyroid enlargement, no tenderness.  LUNGS: Bilateral dry crackles, end-exp. wheezing bilaterally, prolonged inspiratory and expiratory phase, positive use of accessory muscles. CARDIOVASCULAR: S1, S2 RRR. No murmurs, rubs, gallops, clicks.  ABDOMEN: Soft, nontender, nondistended. Bowel sounds present. No  organomegaly or mass.  EXTREMITIES: No pedal edema, cyanosis, or clubbing. + 2 pedal & radial pulses b/l.   NEUROLOGIC: Cranial nerves II through XII are intact. No focal Motor or sensory deficits appreciated b/l PSYCHIATRIC: The patient is alert and oriented x 3.  SKIN: No obvious rash, lesion, or ulcer.   LABORATORY PANEL:   CBC  Recent Labs Lab 08/15/16 1455  WBC 14.1*  HGB 13.7  HCT 40.6  PLT 243   ------------------------------------------------------------------------------------------------------------------  Chemistries   Recent Labs Lab 08/15/16 1455  NA 138  K 3.5  CL 104  CO2 27  GLUCOSE 106*  BUN 11  CREATININE 0.81   CALCIUM 8.7*  AST 38  ALT 26  ALKPHOS 61  BILITOT 0.7   ------------------------------------------------------------------------------------------------------------------  Cardiac Enzymes  Recent Labs Lab 08/15/16 1455  TROPONINI <0.03   ------------------------------------------------------------------------------------------------------------------  RADIOLOGY:  Ct Angio Chest Pe W Or Wo Contrast  Result Date: 08/15/2016 CLINICAL DATA:  83 year old male with history of pulmonary fibrosis on oxygen, with increasing shortness of breath and cough. EXAM: CT ANGIOGRAPHY CHEST WITH CONTRAST TECHNIQUE: Multidetector CT imaging of the chest was performed using the standard protocol during bolus administration of intravenous contrast. Multiplanar CT image reconstructions and MIPs were obtained to evaluate the vascular anatomy. CONTRAST:  75 mL of Isovue 370. COMPARISON:  Chest CT 11/10/2015. FINDINGS: Cardiovascular: No filling defects within the pulmonary arterial tree to suggest underlying pulmonary embolism. Heart size is normal. There is no significant pericardial fluid, thickening or pericardial calcification. Dilatation of the pulmonic trunk (4.1 cm in diameter), indicative of pulmonary arterial hypertension. There is aortic atherosclerosis, as well as atherosclerosis of the great vessels of the mediastinum and the coronary arteries, including calcified atherosclerotic plaque in the left main, left anterior descending and right coronary arteries. Mediastinum/Nodes: Numerous borderline enlarged and enlarged mediastinal and bilateral hilar lymph nodes measuring up to 15 mm in short axis in the low right paratracheal nodal station and 14 mm in short axis in the right hilar region some of these lymph nodes are densely calcified. Esophagus is unremarkable in appearance. No axillary lymphadenopathy. Lungs/Pleura: Again noted are extensive areas of septal thickening, subpleural reticulation, parenchymal  banding, traction bronchiectasis and honeycombing, compatible with interstitial lung disease (likely UIP). However, today's study demonstrates new consolidative changes in the right lower lobe, concerning for superimposed bronchopneumonia. No pleural effusions. Upper Abdomen: Capsular calcifications in the spleen, likely sequela of remote trauma. Aortic atherosclerosis. Musculoskeletal: There are no aggressive appearing lytic or blastic lesions noted in the visualized portions of the skeleton. Review of the MIP images confirms the above findings. IMPRESSION: 1. Right lower lobe bronchopneumonia. 2. 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 3. Dilatation of the pulmonic trunk, indicative of associated pulmonary arterial hypertension. 4. Aortic atherosclerosis, in addition to left main and 2 vessel coronary artery disease. Electronically Signed   By: Trudie Reed M.D.   On: 08/15/2016 16:19     IMPRESSION AND PLAN:   83 year old male with past medical history of coronary artery disease, hypertension, hyperlipidemia, chronic respiratory failure, history of pulmonary fibrosis who presents to the hospital due to worsening shortness of breath and cough.  1. Acute on chronic respiratory failure with hypoxia-secondary to pneumonia with underlying chronic lung disease from pulmonary fibrosis. -We'll treat patient with IV steroids, scheduled DuoNeb's, Pulmicort nebs. Empiric antibiotics with ceftriaxone, Zithromax for the pneumonia.  2. Pneumonia-cause of patient's worsening respiratory failure with hypoxia. -  Likely community-acquired pneumonia. Treated with IV ceftriaxone, Zithromax. Patient has failed treatment with oral Levaquin as an outpatient. Follow sputum cultures.  3. Essential hypertension-continue carvedilol.  4. GERD-continue Protonix.  5. Hyperlipidemia-continue Crestor.    All the records  are reviewed and case discussed with ED provider. Management plans discussed with the patient, family and they are in agreement.  CODE STATUS: Full  TOTAL TIME TAKING CARE OF THIS PATIENT: 40 minutes.    Houston SirenSAINANI,Deandra Gadson J M.D on 08/15/2016 at 4:50 PM  Between 7am to 6pm - Pager - [phone]  After 6pm go to [email or web address] - password [email or web address] [facility]  [facility]  Office  [phone]  CC: Primary care physician; Yevonne PaxKhan, Saadat A, MD

## 2016-08-15 NOTE — ED Provider Notes (Addendum)
[facility] Emergency Department Provider Note   ____________________________________________   First MD Initiated Contact with Patient 08/15/16 1453     (approximate)  I have reviewed the triage vital signs and the nursing notes.   HISTORY  Chief Complaint Shortness of Breath    HPI Vincent Kennedy is a 83 y.o. male who was sent in by Dr. Nickolas Madrid. Patient has pulmonary fibrosis and is on oxygen. He has required more oxygen than usual in the last week has become more short of breath and is coughing more the cough is not productive. Patient felt warm last night. Her carotids concerned that there is something besides a pulmonary fibrosis going on. Wants to get a CT of his chest.   Past Medical History:  Diagnosis Date  . Abnormal nuclear cardiac imaging test    Dr. [name]  . Cataract   . Coronary artery disease    Cardiac cath September 2008: 20% left main stenosis, 40% mid LAD stenosis, 99% ostial first diagonal stenosis in a large branch and 90% ostial disease in second diagonal but the vessel was very small, 20% mid RCA stenosis with normal ejection fraction. Successful angioplasty and drug-eluting stent placement to the ostial first diagonal with a 2.5 x 15 mm Xience stent  . Hyperlipidemia   . Hypertension    BP has never been high(per pt).  Metoprolol is to keep BP low because of stent (afterload)  . Oxygen deficiency   . Pulmonary fibrosis (HCC) 08/26/2015  . Shortness of breath dyspnea    with exertion  . Sleep apnea    has not used CPAP recently.  Feels condition has improved.  . Wears hearing aid    (sometimes)    Patient Active Problem List   Diagnosis Date Noted  . Need for vaccination for Strep pneumoniae 04/28/2016  . Pulmonary fibrosis (HCC) 08/26/2015  . Medication monitoring encounter 08/26/2015  . Dyspnea 03/18/2015  . Coronary artery disease involving native coronary artery of native heart without angina pectoris 10/16/2014    . Auditory disturbance 10/16/2014  . Hypertension goal BP (blood pressure) < 150/90 10/16/2014  . Alzheimer's dementia without behavioral disturbance 10/16/2014  . Status post insertion of drug eluting coronary artery stent 10/16/2014  . Medicare annual wellness visit, subsequent 10/16/2014  . Coronary artery disease   . Hyperlipidemia     Past Surgical History:  Procedure Laterality Date  . CARDIAC CATHETERIZATION     2010  . CATARACT EXTRACTION W/PHACO Right 01/29/2015   Procedure: CATARACT EXTRACTION PHACO AND INTRAOCULAR LENS PLACEMENT (IOC);  Surgeon: Lockie Mola, MD;  Location: [facility];  Service: Ophthalmology;  Laterality: Right;  RESTOR LENS CPAP  . CATARACT EXTRACTION W/PHACO Left 03/12/2015   Procedure: CATARACT EXTRACTION PHACO AND INTRAOCULAR LENS PLACEMENT (IOC);  Surgeon: Lockie Mola, MD;  Location: [facility];  Service: Ophthalmology;  Laterality: Left;  RESTOR SHUGARCAINE  . COLONOSCOPY WITH ESOPHAGOGASTRODUODENOSCOPY (EGD)    . CORONARY ANGIOPLASTY  2010  . EYE SURGERY     cataract  . HERNIA REPAIR Bilateral    inguinal    Prior to Admission medications   Medication Sig Start Date End Date Taking? Authorizing Provider  albuterol (PROVENTIL HFA;VENTOLIN HFA) 108 (90 Base) MCG/ACT inhaler Inhale 2 puffs into the lungs every 6 (six) hours as needed for wheezing or shortness of breath. 03/22/15  Yes Alford Highland, MD  aspirin EC 325 MG tablet Take 1 tablet (325 mg total) by mouth daily. 12/24/15  Yes  Kerman Passey, MD  carvedilol (COREG) 12.5 MG tablet Take 12.5 tablets by mouth daily.   Yes [provider]  ipratropium-albuterol (DUONEB) 0.5-2.5 (3) MG/3ML SOLN Take 3 mLs by nebulization every 6 (six) hours. 03/22/15  Yes Wieting, Richard, MD  levofloxacin (LEVAQUIN) 500 MG tablet Take 500 mg by mouth daily.   Yes [provider]  mirtazapine (REMERON) 15 MG tablet Take 15 mg by mouth at bedtime.  11/17/15  11/16/16 Yes [provider]  mometasone-formoterol (DULERA) 100-5 MCG/ACT AERO Inhale 2 puffs into the lungs 2 (two) times daily. 04/11/15  Yes Edwena Felty, MD  pantoprazole (PROTONIX) 40 MG tablet Take 1 tablet (40 mg total) by mouth daily as needed. Caution:prolonged use may increase risk of pneumonia, colitis, osteoporosis, anemia Patient taking differently: Take 40 mg by mouth daily. Caution:prolonged use may increase risk of pneumonia, colitis, osteoporosis, anemi 08/26/15  Yes Lada, Janit Bern, MD  Pirfenidone (ESBRIET) 267 MG CAPS Take 267 mg by mouth 3 (three) times daily.   Yes [provider]  predniSONE (DELTASONE) 20 MG tablet Take 20 mg by mouth 3 (three) times daily.   Yes [provider]  rivastigmine (EXELON) 9.5 mg/24hr Place 1 patch (9.5 mg total) onto the skin daily. 08/26/15  Yes Lada, Janit Bern, MD  rosuvastatin (CRESTOR) 40 MG tablet Take 1 tablet (40 mg total) by mouth daily. 06/10/16  Yes Iran Ouch, MD  zolpidem (AMBIEN) 5 MG tablet Take 5 mg by mouth at bedtime as needed. 08/07/14  Yes [provider]    Allergies Patient has no known allergies.  Family History  Problem Relation Age of Onset  . CAD Father   . CAD Brother     Social History Social History  Substance Use Topics  . Smoking status: Never Smoker  . Smokeless tobacco: Never Used  . Alcohol use No    Review of Systems  Constitutional: No fever/chills Eyes: No visual changes. ENT: No sore throat. Cardiovascular: Denies chest pain. Respiratory:shortness of breath. Gastrointestinal: No abdominal pain.  No nausea, no vomiting.  No diarrhea.  No constipation. Genitourinary: Negative for dysuria. Musculoskeletal: Negative for back pain. Skin: Negative for rash. Neurological: Negative for headaches, focal weakness or numbness.   ____________________________________________   PHYSICAL EXAM:  VITAL SIGNS: ED Triage Vitals  Enc Vitals Group     BP  08/15/16 1449 (!) 152/71     Pulse Rate 08/15/16 1449 79     Resp 08/15/16 1449 (!) 24     Temp 08/15/16 1449 (!) 100.4 F (38 C)     Temp Source 08/15/16 1449 Oral     SpO2 08/15/16 1449 99 %     Weight 08/15/16 1447 155 lb (70.3 kg)     Height --      Head Circumference --      Peak Flow --      Pain Score --      Pain Loc --      Pain Edu? --      Excl. in GC? --     Constitutional: Alert and oriented. Well appearing and in no acute distress Sitting in wheelchair. Eyes: Conjunctivae are normal. PERRL. EOMI. Head: Atraumatic. Nose: No congestion/rhinnorhea. Mouth/Throat: Mucous membranes are moist.  Oropharynx non-erythematous. Neck: No stridor.  Cardiovascular: Normal rate, regular rhythm. Grossly normal heart sounds.  Good peripheral circulation. Respiratory: Normal respiratory effort.  No retractions. Lungs diffuse crackles throughout Gastrointestinal: Soft and nontender. No distention. No abdominal bruits. No CVA tenderness.  Musculoskeletal: No lower extremity tenderness nor edema.  No joint effusions. Neurologic:  Normal speech and language. No gross focal neurologic deficits are appreciated. Skin:  Skin is warm, dry and intact. No rash noted. Psychiatric: Mood and affect are normal. Speech and behavior are normal.  ____________________________________________   LABS (all labs ordered are listed, but only abnormal results are displayed)  Labs Reviewed  CBC WITH DIFFERENTIAL/PLATELET - Abnormal; Notable for the following:       Result Value   WBC 14.1 (*)    Neutro Abs 11.2 (*)    Monocytes Absolute 1.4 (*)    All other components within normal limits  CULTURE, BLOOD (ROUTINE X 2)  CULTURE, BLOOD (ROUTINE X 2)  COMPREHENSIVE METABOLIC PANEL  TROPONIN I  BRAIN NATRIURETIC PEPTIDE  LACTIC ACID, PLASMA  LACTIC ACID, PLASMA   ____________________________________________  EKG  EKG read and interpreted by me shows normal sinus rhythm rate of 76 left axis there  is early R-wave progression but no obvious acute changes similar to previous except for the increased R-wave progression ____________________________________________  RADIOLOGY   ____________________________________________   PROCEDURES  Procedure(s) performed:   Procedures  Critical Care performed:   ____________________________________________   INITIAL IMPRESSION / ASSESSMENT AND PLAN / ED COURSE  Pertinent labs & imaging results that were available during my care of the patient were reviewed by me and considered in my medical decision making (see chart for details).   Patient's history is now suspicious for pneumonia. We're waiting the lab work and the CT of the chest complete. Dr. Lamont Snowballifenbark will follow-up  the patient Dr. Quentin CornwallSanani is also aware of  the patient.      ____________________________________________   FINAL CLINICAL IMPRESSION(S) / ED DIAGNOSES  Final diagnoses:  Dyspnea      NEW MEDICATIONS STARTED DURING THIS VISIT:  New Prescriptions   No medications on file     Note:  This document was prepared using Dragon voice recognition software and may include unintentional dictation errors.    Arnaldo NatalMalinda, Janessa Mickle F, MD 08/15/16 1534    Arnaldo NatalMalinda, Yari Szeliga F, MD 08/15/16 [phone]

## 2016-08-16 ENCOUNTER — Inpatient Hospital Stay (HOSPITAL_COMMUNITY)
Admit: 2016-08-16 | Discharge: 2016-08-16 | Disposition: A | Discharge status: Home / Self Care | Payer: Medicare Other | Attending: Internal Medicine | Admitting: Internal Medicine

## 2016-08-16 DIAGNOSIS — I272 Pulmonary hypertension, unspecified: Secondary | ICD-10-CM

## 2016-08-16 DIAGNOSIS — I25119 Atherosclerotic heart disease of native coronary artery with unspecified angina pectoris: Secondary | ICD-10-CM

## 2016-08-16 DIAGNOSIS — J9620 Acute and chronic respiratory failure, unspecified whether with hypoxia or hypercapnia: Secondary | ICD-10-CM

## 2016-08-16 DIAGNOSIS — I5030 Unspecified diastolic (congestive) heart failure: Secondary | ICD-10-CM

## 2016-08-16 LAB — CBC
HCT: 39.5 % — ABNORMAL LOW (ref 40.0–52.0)
HEMOGLOBIN: 13.3 g/dL (ref 13.0–18.0)
MCH: 30.3 pg (ref 26.0–34.0)
MCHC: 33.7 g/dL (ref 32.0–36.0)
MCV: 90 fL (ref 80.0–100.0)
Platelets: 224 10*3/uL (ref 150–440)
RBC: 4.39 MIL/uL — AB (ref 4.40–5.90)
RDW: 14 % (ref 11.5–14.5)
WBC: 14.3 10*3/uL — ABNORMAL HIGH (ref 3.8–10.6)

## 2016-08-16 LAB — BASIC METABOLIC PANEL
ANION GAP: 9 (ref 5–15)
BUN: 15 mg/dL (ref 6–20)
CALCIUM: 8.7 mg/dL — AB (ref 8.9–10.3)
CO2: 24 mmol/L (ref 22–32)
Chloride: 105 mmol/L (ref 101–111)
Creatinine, Ser: 0.84 mg/dL (ref 0.61–1.24)
GFR calc non Af Amer: >60 mL/min (ref >60)
Glucose, Bld: 144 mg/dL — ABNORMAL HIGH (ref 65–99)
Potassium: 3.8 mmol/L (ref 3.5–5.1)
Sodium: 138 mmol/L (ref 135–145)

## 2016-08-16 LAB — ECHOCARDIOGRAM COMPLETE
HEIGHTINCHES: 68 in
WEIGHTICAEL: 2478.4 [oz_av]

## 2016-08-16 LAB — GLUCOSE, CAPILLARY: Glucose-Capillary: 137 mg/dL — ABNORMAL HIGH (ref 65–99)

## 2016-08-16 MED ORDER — GUAIFENESIN ER 600 MG PO TB12
600.0000 mg | ORAL_TABLET | Freq: Two times a day (BID) | ORAL | Status: DC
  Administered 2016-08-16 – 2016-08-19 (×7): 600 mg via ORAL
  Filled 2016-08-16 (×7): qty 1

## 2016-08-16 MED ORDER — CARVEDILOL 6.25 MG PO TABS
6.2500 mg | ORAL_TABLET | Freq: Two times a day (BID) | ORAL | Status: DC
  Administered 2016-08-16 – 2016-08-19 (×6): 6.25 mg via ORAL
  Filled 2016-08-16 (×6): qty 1

## 2016-08-16 MED ORDER — MENTHOL 3 MG MT LOZG
1.0000 | LOZENGE | OROMUCOSAL | Status: DC | PRN
  Administered 2016-08-16 – 2016-08-17 (×2): 3 mg via ORAL
  Filled 2016-08-16: qty 9

## 2016-08-16 MED ORDER — ASPIRIN EC 81 MG PO TBEC
81.0000 mg | DELAYED_RELEASE_TABLET | Freq: Every day | ORAL | Status: DC
  Administered 2016-08-16 – 2016-08-18 (×4): 81 mg via ORAL
  Filled 2016-08-16 (×4): qty 1

## 2016-08-16 NOTE — Progress Notes (Signed)
[employer] - [place] [facility]                                                                                                                                                                                  Patient Demographics   Vincent Kennedy, is a 83 y.o. male, DOB - Dec 03, 1932, ZOX:[id number]  Admit date - 08/15/2016   Admitting Physician Houston Siren, MD  Outpatient Primary MD for the patient is Yevonne Pax, MD   LOS - 1  Subjective: Pt Still having shortness of breath and cough.  He is requesting a sputum culture.     Review of Systems:   CONSTITUTIONAL: No documented fever. No fatigue, weakness. No weight gain, no weight loss.  EYES: No blurry or double vision.  ENT: No tinnitus. No postnasal drip. No redness of the oropharynx.  RESPIRATORY: + cough, no wheeze, no hemoptysis. No dyspnea.  CARDIOVASCULAR: No chest pain. No orthopnea. No palpitations. No syncope.  GASTROINTESTINAL: No nausea, no vomiting or diarrhea. No abdominal pain. No melena or hematochezia.  GENITOURINARY: No dysuria or hematuria.  ENDOCRINE: No polyuria or nocturia. No heat or cold intolerance.  HEMATOLOGY: No anemia. No bruising. No bleeding.  INTEGUMENTARY: No rashes. No lesions.  MUSCULOSKELETAL: No arthritis. No swelling. No gout.  NEUROLOGIC: No numbness, tingling, or ataxia. No seizure-type activity.  PSYCHIATRIC: No anxiety. No insomnia. No ADD.    Vitals:   Vitals:   08/15/16 2001 08/16/16 0024 08/16/16 0403 08/16/16 0441  BP:    (!) 95/55  Pulse:    (!) 56  Resp:    19  Temp:    98 F (36.7 C)  TempSrc:    Oral  SpO2: 96% 96% 95% 96%  Weight:      Height:        Wt Readings from Last 3 Encounters:  08/15/16 154 lb 14.4 oz (70.3 kg)  04/26/16 155 lb 7 oz (70.5 kg)  02/03/16 158 lb 12 oz (72 kg)     Intake/Output Summary (Last 24 hours) at 08/16/16 1333 Last data filed at 08/16/16 0800  Gross per 24 hour  Intake              240 ml  Output               200 ml  Net               40 ml    Physical Exam:   GENERAL: Pleasant-appearing in no apparent distress.  HEAD, EYES, EARS, NOSE AND THROAT: Atraumatic, normocephalic. Extraocular muscles are intact. Pupils equal and reactive to light. Sclerae anicteric. No conjunctival injection. No oro-pharyngeal erythema.  NECK: Supple. There is no jugular venous distention. No bruits, no lymphadenopathy, no thyromegaly.  HEART: Regular rate and rhythm,. No murmurs, no rubs, no clicks.  LUNGS:  Rhonchus breath sounds at the right base. No sensory muscle usage no wheezing or crackles ABDOMEN: Soft, flat, nontender, nondistended. Has good bowel sounds. No hepatosplenomegaly appreciated.  EXTREMITIES: No evidence of any cyanosis, clubbing, or peripheral edema.  +2 pedal and radial pulses bilaterally.  NEUROLOGIC: The patient is alert, awake, and oriented x3 with no focal motor or sensory deficits appreciated bilaterally.  SKIN: Moist and warm with no rashes appreciated.  Psych: Not anxious, depressed LN: No inguinal LN enlargement    Antibiotics   Anti-infectives    Start     Dose/Rate Route Frequency Ordered Stop   08/16/16 1800  cefTRIAXone (ROCEPHIN) 1 g in dextrose 5 % 50 mL IVPB     1 g 100 mL/hr over 30 Minutes Intravenous Every 24 hours 08/15/16 1754     08/16/16 1000  azithromycin (ZITHROMAX) tablet 250 mg     250 mg Oral Daily 08/15/16 1754     08/15/16 1515  cefTRIAXone (ROCEPHIN) 1 g in dextrose 5 % 50 mL IVPB - Premix     1 g 100 mL/hr over 30 Minutes Intravenous  Once 08/15/16 1505 08/15/16 1547   08/15/16 1515  azithromycin (ZITHROMAX) 500 mg in dextrose 5 % 250 mL IVPB     500 mg 250 mL/hr over 60 Minutes Intravenous  Once 08/15/16 1505 08/15/16 1900      Medications   Scheduled Meds: . aspirin EC  81 mg Oral Daily  . azithromycin  250 mg Oral Daily  . budesonide (PULMICORT) nebulizer solution  0.5 mg Nebulization BID  . carvedilol  6.25 mg Oral BID WC  .  ciprofloxacin  2 drop Left Eye Q4H while awake  . enoxaparin (LOVENOX) injection  40 mg Subcutaneous Q24H  . guaiFENesin  600 mg Oral BID  . ipratropium-albuterol  3 mL Nebulization Q4H  . mouth rinse  15 mL Mouth Rinse BID  . methylPREDNISolone (SOLU-MEDROL) injection  40 mg Intravenous Q6H  . mirtazapine  15 mg Oral QHS  . pantoprazole  40 mg Oral Daily  . Pirfenidone  801 mg Oral TID  . rosuvastatin  40 mg Oral Daily   Continuous Infusions: . cefTRIAXone (ROCEPHIN)  IV     PRN Meds:.acetaminophen **OR** acetaminophen, ondansetron **OR** ondansetron (ZOFRAN) IV, zolpidem   Data Review:   Micro Results Recent Results (from the past 240 hour(s))  Culture, blood (routine x 2)     Status: None (Preliminary result)   Collection Time: 08/15/16  3:02 PM  Result Value Ref Range Status   Specimen Description BLOOD LEFT AC  Final   Special Requests BOTTLES DRAWN AEROBIC AND ANAEROBIC BCHV  Final   Culture NO GROWTH < 24 HOURS  Final   Report Status PENDING  Incomplete  Culture, blood (routine x 2)     Status: None (Preliminary result)   Collection Time: 08/15/16  3:11 PM  Result Value Ref Range Status   Specimen Description BLOOD RIGHT AC  Final   Special Requests BOTTLES DRAWN AEROBIC AND ANAEROBIC BCHV  Final   Culture NO GROWTH < 24 HOURS  Final   Report Status PENDING  Incomplete    Radiology Reports Dg Chest 2 View  Result Date: 08/10/2016 CLINICAL DATA:  Productive cough. History of COPD. Pulmonary fibrosis. EXAM: CHEST  2 VIEW COMPARISON:  CT 11/10/2015.  Chest x-ray  10/06/2015. FINDINGS: Mediastinum and hilar structures are stable. Stable mild cardiomegaly. No pulmonary venous congestion. Stable bilateral chronic interstitial changes consistent with interstitial fibrosis. As noted on prior studies active interstitial lung disease cannot excluded. No evidence of progression of interstitial changes. No pleural effusion or pneumothorax. Thoracic spine scoliosis. Diffuse  osteopenia and degenerative change thoracic spine. Aortic atherosclerotic vascular calcification. IMPRESSION: 1. Chronic interstitial lung disease, stable in appearance from prior exam. No acute pulmonary infiltrate noted. 2.  Stable mild cardiomegaly.  No pulmonary venous congestion. 3.  Aortic atherosclerotic vascular disease. Electronically Signed   By: Maisie Fus  Register   On: 08/10/2016 16:02   Ct Angio Chest Pe W Or Wo Contrast  Result Date: 08/15/2016 CLINICAL DATA:  83 year old male with history of pulmonary fibrosis on oxygen, with increasing shortness of breath and cough. EXAM: CT ANGIOGRAPHY CHEST WITH CONTRAST TECHNIQUE: Multidetector CT imaging of the chest was performed using the standard protocol during bolus administration of intravenous contrast. Multiplanar CT image reconstructions and MIPs were obtained to evaluate the vascular anatomy. CONTRAST:  75 mL of Isovue 370. COMPARISON:  Chest CT 11/10/2015. FINDINGS: Cardiovascular: No filling defects within the pulmonary arterial tree to suggest underlying pulmonary embolism. Heart size is normal. There is no significant pericardial fluid, thickening or pericardial calcification. Dilatation of the pulmonic trunk (4.1 cm in diameter), indicative of pulmonary arterial hypertension. There is aortic atherosclerosis, as well as atherosclerosis of the great vessels of the mediastinum and the coronary arteries, including calcified atherosclerotic plaque in the left main, left anterior descending and right coronary arteries. Mediastinum/Nodes: Numerous borderline enlarged and enlarged mediastinal and bilateral hilar lymph nodes measuring up to 15 mm in short axis in the low right paratracheal nodal station and 14 mm in short axis in the right hilar region some of these lymph nodes are densely calcified. Esophagus is unremarkable in appearance. No axillary lymphadenopathy. Lungs/Pleura: Again noted are extensive areas of septal thickening, subpleural  reticulation, parenchymal banding, traction bronchiectasis and honeycombing, compatible with interstitial lung disease (likely UIP). However, today's study demonstrates new consolidative changes in the right lower lobe, concerning for superimposed bronchopneumonia. No pleural effusions. Upper Abdomen: Capsular calcifications in the spleen, likely sequela of remote trauma. Aortic atherosclerosis. Musculoskeletal: There are no aggressive appearing lytic or blastic lesions noted in the visualized portions of the skeleton. Review of the MIP images confirms the above findings. IMPRESSION: 1. Right lower lobe bronchopneumonia. 2. 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 3. Dilatation of the pulmonic trunk, indicative of associated pulmonary arterial hypertension. 4. Aortic atherosclerosis, in addition to left main and 2 vessel coronary artery disease. Electronically Signed   By: Trudie Reed M.D.   On: 08/15/2016 16:19     CBC  Recent Labs Lab 08/15/16 1455 08/16/16 0432  WBC 14.1* 14.3*  HGB 13.7 13.3  HCT 40.6 39.5*  PLT 243 224  MCV 89.1 90.0  MCH 30.1 30.3  MCHC 33.8 33.7  RDW 14.2 14.0  LYMPHSABS 1.3  --   MONOABS 1.4*  --   EOSABS 0.2  --   BASOSABS 0.0  --     Chemistries   Recent Labs Lab 08/15/16 1455 08/16/16 0432  NA 138 138  K 3.5 3.8  CL 104 105  CO2 27 24  GLUCOSE 106* 144*  BUN 11 15  CREATININE 0.81 0.84  CALCIUM 8.7* 8.7*  AST 38  --   ALT 26  --  ALKPHOS 61  --   BILITOT 0.7  --    ------------------------------------------------------------------------------------------------------------------ estimated creatinine clearance is 65.6 mL/min (by C-G formula based on SCr of 0.84 mg/dL). ------------------------------------------------------------------------------------------------------------------ No results for input(s): HGBA1C in the last 72  hours. ------------------------------------------------------------------------------------------------------------------ No results for input(s): CHOL, HDL, LDLCALC, TRIG, CHOLHDL, LDLDIRECT in the last 72 hours. ------------------------------------------------------------------------------------------------------------------ No results for input(s): TSH, T4TOTAL, T3FREE, THYROIDAB in the last 72 hours.  Invalid input(s): FREET3 ------------------------------------------------------------------------------------------------------------------ No results for input(s): VITAMINB12, FOLATE, FERRITIN, TIBC, IRON, RETICCTPCT in the last 72 hours.  Coagulation profile No results for input(s): INR, PROTIME in the last 168 hours.  No results for input(s): DDIMER in the last 72 hours.  Cardiac Enzymes  Recent Labs Lab 08/15/16 1455  TROPONINI <0.03   ------------------------------------------------------------------------------------------------------------------ Invalid input(s): POCBNP    Assessment & Plan   83 year old male with past medical history of coronary artery disease, hypertension, hyperlipidemia, chronic respiratory failure, history of pulmonary fibrosis who presents to the hospital due to worsening shortness of breath and cough.  1. Acute on chronic respiratory failure with hypoxia-secondary to pneumonia with underlying chronic lung disease from pulmonary fibrosis. continue IV steroids, scheduled DuoNeb's, Pulmicort nebs. Continue iv ceftriaxone and  Zithromax for the pneumonia.  2. Pneumonia due to CAP  worsening respiratory failure with hypoxia. Continue iv antibiotics   3. Essential hypertension-continue carvedilol.  4. GERD-continue Protonix.  5. Hyperlipidemia-continue Crestor.     Code Status Orders        Start     Ordered   08/15/16 1755  Full code  Continuous     08/15/16 1754    Code Status History    Date Active Date Inactive Code Status Order  ID Comments User Context   03/20/2015  4:04 PM 03/22/2015  6:47 PM Full Code [id number]  Alford HighlandWieting, Richard, MD Inpatient           Consults   Cardiology, and pulmonary   DVT Prophylaxis  Lovenox   Lab Results  Component Value Date   PLT 224 08/16/2016     Time Spent in minutes 35min Greater than 50% of time spent in care coordination and counseling patient regarding the condition and plan of care.   Auburn BilberryPATEL, Baylea Milburn M.D on 08/16/2016 at 1:33 PM  Between 7am to 6pm - Pager - [phone]  After 6pm go to [email or web address] - password [email or web address] [facility]  [facility]   Office  [phone]

## 2016-08-16 NOTE — Consult Note (Signed)
CARDIOLOGY CONSULT NOTE  Patient ID: [name] MRN: [id number] DOB/AGE: 1933-02-21 83 y.o.  Admit date: 08/15/2016 Referring Physician:  [name]  Primary Physician: Yevonne Pax, MD Primary Cardiologist : Dr. [name] Reason for Consultation : Coronary calcifications and possible pulmonary hypertension.  Date:  08/16/2016    Chief Complaint  Patient presents with  . Shortness of Breath      History of Present Illness: Vincent Kennedy is a 83 y.o. retired Development worker, [employer] who  is well-known to me with history of coronary artery disease. He had unstable angina in 2008. Cardiac catheterization showed severe ostial first diagonal stenosis and mild disease otherwise. I performed successful angioplasty and drug-eluting stent placement to the ostial first diagonal. No cardiac events since then. He was diagnosed with pulmonary fibrosis few years ago and has been on home oxygen. Other medical problems include hyperlipidemia and GERD. He presented with worsening shortness of breath, cough, low-grade fever and worsening GERD symptoms. He underwent a CT scan of the chest which showed evidence of right lower lobe pneumonia with coronary calcifications including left main and dilated pulmonary artery suggestive of pulmonary hypertension. Before his current illness, he has noticed gradual decline in functional capacity with exertional tachycardia in spite of maintaining normal oxygen level. He denies any chest discomfort. No orthopnea, PND or leg edema. He was started on antibiotics and steroids and reports improvement in symptoms this morning. He did have excessive sweating last night.    Past Medical History:  Diagnosis Date  . Abnormal nuclear cardiac imaging test    Dr. [name]  . Cataract   . Coronary artery disease    Cardiac cath September 2008: 20% left main stenosis, 40% mid LAD stenosis, 99% ostial first diagonal stenosis in a large branch and 90% ostial disease in second diagonal  but the vessel was very small, 20% mid RCA stenosis with normal ejection fraction. Successful angioplasty and drug-eluting stent placement to the ostial first diagonal with a 2.5 x 15 mm Xience stent  . Hyperlipidemia   . Hypertension    BP has never been high(per pt).  Metoprolol is to keep BP low because of stent (afterload)  . Oxygen deficiency   . Pulmonary fibrosis (HCC) 08/26/2015  . Shortness of breath dyspnea    with exertion  . Sleep apnea    has not used CPAP recently.  Feels condition has improved.  . Wears hearing aid    (sometimes)    Past Surgical History:  Procedure Laterality Date  . CARDIAC CATHETERIZATION     2010  . CATARACT EXTRACTION W/PHACO Right 01/29/2015   Procedure: CATARACT EXTRACTION PHACO AND INTRAOCULAR LENS PLACEMENT (IOC);  Surgeon: Lockie Mola, MD;  Location: [facility];  Service: Ophthalmology;  Laterality: Right;  RESTOR LENS CPAP  . CATARACT EXTRACTION W/PHACO Left 03/12/2015   Procedure: CATARACT EXTRACTION PHACO AND INTRAOCULAR LENS PLACEMENT (IOC);  Surgeon: Lockie Mola, MD;  Location: [facility];  Service: Ophthalmology;  Laterality: Left;  RESTOR SHUGARCAINE  . COLONOSCOPY WITH ESOPHAGOGASTRODUODENOSCOPY (EGD)    . CORONARY ANGIOPLASTY  2010  . EYE SURGERY     cataract  . HERNIA REPAIR Bilateral    inguinal     Current Facility-Administered Medications  Medication Dose Route Frequency Provider Last Rate Last Dose  . acetaminophen (TYLENOL) tablet 650 mg  650 mg Oral Q6H PRN Houston Siren, MD       Or  . acetaminophen (TYLENOL) suppository 650 mg  650 mg Rectal  Q6H PRN Houston SirenSainani, Vivek J, MD      . aspirin EC tablet 81 mg  81 mg Oral Daily Lorine BearsArida, Retha Bither A, MD      . azithromycin (ZITHROMAX) tablet 250 mg  250 mg Oral Daily Houston SirenSainani, Vivek J, MD   250 mg at 08/16/16 0845  . budesonide (PULMICORT) nebulizer solution 0.5 mg  0.5 mg Nebulization BID Houston SirenSainani, Vivek J, MD   0.5 mg at 08/16/16 0835  . carvedilol  (COREG) tablet 6.25 mg  6.25 mg Oral BID WC Quadry Kampa A, MD      . cefTRIAXone (ROCEPHIN) 1 g in dextrose 5 % 50 mL IVPB  1 g Intravenous Q24H Sainani, Rolly PancakeVivek J, MD      . ciprofloxacin (CILOXAN) 0.3 % ophthalmic solution 2 drop  2 drop Left Eye Q4H while awake Houston SirenSainani, Vivek J, MD   2 drop at 08/16/16 0846  . enoxaparin (LOVENOX) injection 40 mg  40 mg Subcutaneous Q24H Houston SirenSainani, Vivek J, MD   40 mg at 08/15/16 2050  . ipratropium-albuterol (DUONEB) 0.5-2.5 (3) MG/3ML nebulizer solution 3 mL  3 mL Nebulization Q4H Houston SirenSainani, Vivek J, MD   3 mL at 08/16/16 0835  . MEDLINE mouth rinse  15 mL Mouth Rinse BID Houston SirenSainani, Vivek J, MD   15 mL at 08/16/16 0848  . methylPREDNISolone sodium succinate (SOLU-MEDROL) 40 mg/mL injection 40 mg  40 mg Intravenous Q6H Houston SirenSainani, Vivek J, MD   40 mg at 08/16/16 0557  . mirtazapine (REMERON) tablet 15 mg  15 mg Oral QHS Houston SirenSainani, Vivek J, MD   15 mg at 08/15/16 2048  . ondansetron (ZOFRAN) tablet 4 mg  4 mg Oral Q6H PRN Houston SirenSainani, Vivek J, MD       Or  . ondansetron (ZOFRAN) injection 4 mg  4 mg Intravenous Q6H PRN Houston SirenSainani, Vivek J, MD      . pantoprazole (PROTONIX) EC tablet 40 mg  40 mg Oral Daily Houston SirenSainani, Vivek J, MD   40 mg at 08/15/16 2048  . Pirfenidone CAPS 801 mg  801 mg Oral TID Houston SirenSainani, Vivek J, MD   801 mg at 08/16/16 0906  . rosuvastatin (CRESTOR) tablet 40 mg  40 mg Oral Daily Houston SirenSainani, Vivek J, MD   40 mg at 08/16/16 0845  . zolpidem (AMBIEN) tablet 5 mg  5 mg Oral QHS PRN Houston SirenSainani, Vivek J, MD        Allergies:   Patient has no known allergies.    Social History:  The patient  reports that he has never smoked. He has never used smokeless tobacco. He reports that he does not drink alcohol or use drugs.   Family History:  The patient's family history includes CAD in his brother and father.    ROS:  Please see the history of present illness.   Otherwise, review of systems are positive for none.   All other systems are reviewed and negative.     PHYSICAL EXAM: VS:  BP (!) 95/55 (BP Location: Right Arm)   Pulse (!) 56   Temp 98 F (36.7 C) (Oral)   Resp 19   Ht 5\' 8"  (1.727 m)   Wt 154 lb 14.4 oz (70.3 kg)   SpO2 96%   BMI 23.55 kg/m  , BMI Body mass index is 23.55 kg/m. GEN: Well nourished, well developed, in no acute distress  HEENT: normal  Neck: no JVD, carotid bruits, or masses Cardiac: RRR; no murmurs, rubs, or gallops,no edema  Respiratory:  Dry crackles  throughout the lung auscultation with bronchial breath sounds at the right lower base. He is on 2 L oxygen. GI: soft, nontender, nondistended, + BS MS: no deformity or atrophy  Skin: warm and dry, no rash Neuro:  Strength and sensation are intact Psych: euthymic mood, full affect   EKG:   Personally reviewed by me and showed:  Normal sinus rhythm with borderline left ventricular hypertrophy and nonspecific ST and T wave changes.  Telemetry recently reviewed by me and showed:  Normal sinus rhythm with no evidence of arrhythmia.  Recent Labs: 08/15/2016: ALT 26; B Natriuretic Peptide 98.0 08/16/2016: BUN 15; Creatinine, Ser 0.84; Hemoglobin 13.3; Platelets 224; Potassium 3.8; Sodium 138    Lipid Panel    Component Value Date/Time   CHOL 149 12/24/2015 1105   CHOL 187 08/26/2015 0931   TRIG 105 12/24/2015 1105   HDL 27 (L) 12/24/2015 1105   HDL 34 (L) 08/26/2015 0931   CHOLHDL 5.5 (H) 12/24/2015 1105   VLDL 21 12/24/2015 1105   LDLCALC 101 12/24/2015 1105   LDLCALC 131 (H) 08/26/2015 0931      Wt Readings from Last 3 Encounters:  08/15/16 154 lb 14.4 oz (70.3 kg)  04/26/16 155 lb 7 oz (70.5 kg)  02/03/16 158 lb 12 oz (72 kg)      Other studies Reviewed: Additional studies/ records that were reviewed today include: Echocardiogram. Review of the above records demonstrates: Normal LV systolic function with mild pulmonary hypertension.   ASSESSMENT AND PLAN:  1.  Acute on chronic respiratory failure likely due to right lower lobe pneumonia in  the setting of underlying pulmonary fibrosis: He is improving with antibiotics and steroids. Continue current treatment.  2. Coronary artery disease involving native coronary arteries: Previous stenting of the first diagonal 2008 with no recurrent cardiac events since then. Recent CT scan showed evidence of coronary calcifications including left main. The pulmonary artery was also dilated suggestive of pulmonary hypertension. Echocardiogram showed only mild pulmonary hypertension. Given these findings and the patient's gradual decline in functional capacity, I think it's reasonable to proceed with a left and right cardiac catheterization to ensure that his cardiac status is not contributing to his symptoms. However, his current hospitalization seems to be due to pneumonia and it is improving. Thus, there is no urgency with this. I suggested that we evaluate this in the outpatient setting once he is recovered from pneumonia.  3. Essential hypertension: Blood pressure has been on the low side recently could be June the setting of acute illness. I decrease carvedilol to 6.25 mg twice daily.  4. GERD: I agree with the PPI. I decrease aspirin to 81 mg once daily.  5. Hyperlipidemia: Continue high-dose rosuvastatin.    Signed,  [name], MD  08/16/2016 10:26 AM    [facility]

## 2016-08-17 LAB — CBC
HEMATOCRIT: 36.9 % — AB (ref 40.0–52.0)
HEMOGLOBIN: 12.5 g/dL — AB (ref 13.0–18.0)
MCH: 30.1 pg (ref 26.0–34.0)
MCHC: 33.9 g/dL (ref 32.0–36.0)
MCV: 88.8 fL (ref 80.0–100.0)
Platelets: 241 10*3/uL (ref 150–440)
RBC: 4.15 MIL/uL — ABNORMAL LOW (ref 4.40–5.90)
RDW: 14 % (ref 11.5–14.5)
WBC: 17.7 10*3/uL — AB (ref 3.8–10.6)

## 2016-08-17 LAB — BASIC METABOLIC PANEL
ANION GAP: 6 (ref 5–15)
BUN: 19 mg/dL (ref 6–20)
CHLORIDE: 107 mmol/L (ref 101–111)
CO2: 25 mmol/L (ref 22–32)
Calcium: 8.6 mg/dL — ABNORMAL LOW (ref 8.9–10.3)
Creatinine, Ser: 0.71 mg/dL (ref 0.61–1.24)
GFR calc non Af Amer: >60 mL/min (ref >60)
Glucose, Bld: 156 mg/dL — ABNORMAL HIGH (ref 65–99)
POTASSIUM: 4 mmol/L (ref 3.5–5.1)
Sodium: 138 mmol/L (ref 135–145)

## 2016-08-17 LAB — PROCALCITONIN: Procalcitonin: <0.1 ng/mL

## 2016-08-17 MED ORDER — AZITHROMYCIN 250 MG PO TABS
250.0000 mg | ORAL_TABLET | Freq: Every day | ORAL | Status: DC
  Administered 2016-08-18 – 2016-08-19 (×2): 250 mg via ORAL
  Filled 2016-08-17 (×3): qty 1

## 2016-08-17 MED ORDER — FLUTICASONE PROPIONATE 50 MCG/ACT NA SUSP
1.0000 | Freq: Every day | NASAL | Status: DC
  Administered 2016-08-17 – 2016-08-19 (×3): 1 via NASAL
  Filled 2016-08-17: qty 16

## 2016-08-17 MED ORDER — METHYLPREDNISOLONE SODIUM SUCC 40 MG IJ SOLR
40.0000 mg | Freq: Two times a day (BID) | INTRAMUSCULAR | Status: DC
  Administered 2016-08-17 – 2016-08-19 (×4): 40 mg via INTRAVENOUS
  Filled 2016-08-17 (×4): qty 1

## 2016-08-17 NOTE — Progress Notes (Signed)
[facility] [place] [facility]                                                                                                                                                                                  Patient Demographics   Vincent Kennedy, is a 83 y.o. male, DOB - 1932-08-25, WUJ:[id number]  Admit date - 08/15/2016   Admitting Physician Houston Siren, MD  Outpatient Primary MD for the patient is Yevonne Pax, MD   LOS - 2  Subjective: He says that he is feeling a little better, less short of breath.   Review of Systems:   CONSTITUTIONAL: No documented fever. No fatigue, weakness. No weight gain, no weight loss.  EYES: No blurry or double vision.  ENT: No tinnitus. No postnasal drip. No redness of the oropharynx.  RESPIRATORY: + cough, no wheeze, no hemoptysis. No dyspnea.  CARDIOVASCULAR: No chest pain. No orthopnea. No palpitations. No syncope.  GASTROINTESTINAL: No nausea, no vomiting or diarrhea. No abdominal pain. No melena or hematochezia.  GENITOURINARY: No dysuria or hematuria.  ENDOCRINE: No polyuria or nocturia. No heat or cold intolerance.  HEMATOLOGY: No anemia. No bruising. No bleeding.  INTEGUMENTARY: No rashes. No lesions.  MUSCULOSKELETAL: No arthritis. No swelling. No gout.  NEUROLOGIC: No numbness, tingling, or ataxia. No seizure-type activity.  PSYCHIATRIC: No anxiety. No insomnia. No ADD.    Vitals:   Vitals:   08/17/16 0408 08/17/16 0409 08/17/16 0750 08/17/16 1003  BP: (!) 100/47   (!) 162/70  Pulse: (!) 57   66  Resp: 19     Temp: 97.5 F (36.4 C)   97.8 F (36.6 C)  TempSrc: Axillary   Oral  SpO2: 100% 97% 93% 98%  Weight:      Height:        Wt Readings from Last 3 Encounters:  08/15/16 70.3 kg (154 lb 14.4 oz)  04/26/16 70.5 kg (155 lb 7 oz)  02/03/16 72 kg (158 lb 12 oz)     Intake/Output Summary (Last 24 hours) at 08/17/16 1024 Last data filed at 08/17/16 0900  Gross per 24 hour  Intake              1250 ml  Output                0 ml  Net             1250 ml    Physical Exam:   GENERAL: Pleasant-appearing in no apparent distress.  HEAD, EYES, EARS, NOSE AND THROAT: Atraumatic, normocephalic. Extraocular muscles are intact. Pupils equal and reactive to light. Sclerae anicteric. No conjunctival injection. No oro-pharyngeal erythema.  NECK: Supple. There is no jugular venous distention. No bruits, no lymphadenopathy, no thyromegaly.  HEART: Regular rate and rhythm,. No murmurs, no rubs, no clicks.  LUNGS:  Rhonchus breath sounds at the right base. No  Accessory  muscle usage .no wheezing or crackles ABDOMEN: Soft, flat, nontender, nondistended. Has good bowel sounds. No hepatosplenomegaly appreciated.  EXTREMITIES: No evidence of any cyanosis, clubbing, or peripheral edema.  +2 pedal and radial pulses bilaterally.  NEUROLOGIC: The patient is alert, awake, and oriented x3 with no focal motor or sensory deficits appreciated bilaterally.  SKIN: Moist and warm with no rashes appreciated.  Psych: Not anxious, depressed LN: No inguinal LN enlargement    Antibiotics   Anti-infectives    Start     Dose/Rate Route Frequency Ordered Stop   08/16/16 1800  cefTRIAXone (ROCEPHIN) 1 g in dextrose 5 % 50 mL IVPB     1 g 100 mL/hr over 30 Minutes Intravenous Every 24 hours 08/15/16 1754     08/16/16 1000  azithromycin (ZITHROMAX) tablet 250 mg     250 mg Oral Daily 08/15/16 1754     08/15/16 1515  cefTRIAXone (ROCEPHIN) 1 g in dextrose 5 % 50 mL IVPB - Premix     1 g 100 mL/hr over 30 Minutes Intravenous  Once 08/15/16 1505 08/15/16 1547   08/15/16 1515  azithromycin (ZITHROMAX) 500 mg in dextrose 5 % 250 mL IVPB     500 mg 250 mL/hr over 60 Minutes Intravenous  Once 08/15/16 1505 08/15/16 1900      Medications   Scheduled Meds: . aspirin EC  81 mg Oral Daily  . azithromycin  250 mg Oral Daily  . budesonide (PULMICORT) nebulizer solution  0.5 mg Nebulization BID  . carvedilol  6.25 mg  Oral BID WC  . ciprofloxacin  2 drop Left Eye Q4H while awake  . enoxaparin (LOVENOX) injection  40 mg Subcutaneous Q24H  . guaiFENesin  600 mg Oral BID  . ipratropium-albuterol  3 mL Nebulization Q4H  . mouth rinse  15 mL Mouth Rinse BID  . methylPREDNISolone (SOLU-MEDROL) injection  40 mg Intravenous Q12H  . mirtazapine  15 mg Oral QHS  . pantoprazole  40 mg Oral Daily  . Pirfenidone  801 mg Oral TID  . rosuvastatin  40 mg Oral Daily   Continuous Infusions: . cefTRIAXone (ROCEPHIN)  IV Stopped (08/16/16 1835)   PRN Meds:.acetaminophen **OR** acetaminophen, menthol-cetylpyridinium, ondansetron **OR** ondansetron (ZOFRAN) IV, zolpidem   Data Review:   Micro Results Recent Results (from the past 240 hour(s))  Culture, blood (routine x 2)     Status: None (Preliminary result)   Collection Time: 08/15/16  3:02 PM  Result Value Ref Range Status   Specimen Description BLOOD LEFT AC  Final   Special Requests BOTTLES DRAWN AEROBIC AND ANAEROBIC BCHV  Final   Culture NO GROWTH 2 DAYS  Final   Report Status PENDING  Incomplete  Culture, blood (routine x 2)     Status: None (Preliminary result)   Collection Time: 08/15/16  3:11 PM  Result Value Ref Range Status   Specimen Description BLOOD RIGHT AC  Final   Special Requests BOTTLES DRAWN AEROBIC AND ANAEROBIC BCHV  Final   Culture NO GROWTH 2 DAYS  Final   Report Status PENDING  Incomplete    Radiology Reports Dg Chest 2 View  Result Date: 08/10/2016 CLINICAL DATA:  Productive cough. History of COPD. Pulmonary fibrosis. EXAM: CHEST  2 VIEW COMPARISON:  CT 11/10/2015.  Chest x-ray 10/06/2015. FINDINGS: Mediastinum and hilar structures are stable. Stable mild cardiomegaly. No pulmonary venous congestion. Stable bilateral chronic interstitial changes consistent with interstitial fibrosis. As noted on prior studies active interstitial lung disease cannot excluded. No evidence of progression of interstitial changes. No pleural effusion or  pneumothorax. Thoracic spine scoliosis. Diffuse osteopenia and degenerative change thoracic spine. Aortic atherosclerotic vascular calcification. IMPRESSION: 1. Chronic interstitial lung disease, stable in appearance from prior exam. No acute pulmonary infiltrate noted. 2.  Stable mild cardiomegaly.  No pulmonary venous congestion. 3.  Aortic atherosclerotic vascular disease. Electronically Signed   By: Maisie Fus  Register   On: 08/10/2016 16:02   Ct Angio Chest Pe W Or Wo Contrast  Result Date: 08/15/2016 CLINICAL DATA:  83 year old male with history of pulmonary fibrosis on oxygen, with increasing shortness of breath and cough. EXAM: CT ANGIOGRAPHY CHEST WITH CONTRAST TECHNIQUE: Multidetector CT imaging of the chest was performed using the standard protocol during bolus administration of intravenous contrast. Multiplanar CT image reconstructions and MIPs were obtained to evaluate the vascular anatomy. CONTRAST:  75 mL of Isovue 370. COMPARISON:  Chest CT 11/10/2015. FINDINGS: Cardiovascular: No filling defects within the pulmonary arterial tree to suggest underlying pulmonary embolism. Heart size is normal. There is no significant pericardial fluid, thickening or pericardial calcification. Dilatation of the pulmonic trunk (4.1 cm in diameter), indicative of pulmonary arterial hypertension. There is aortic atherosclerosis, as well as atherosclerosis of the great vessels of the mediastinum and the coronary arteries, including calcified atherosclerotic plaque in the left main, left anterior descending and right coronary arteries. Mediastinum/Nodes: Numerous borderline enlarged and enlarged mediastinal and bilateral hilar lymph nodes measuring up to 15 mm in short axis in the low right paratracheal nodal station and 14 mm in short axis in the right hilar region some of these lymph nodes are densely calcified. Esophagus is unremarkable in appearance. No axillary lymphadenopathy. Lungs/Pleura: Again noted are extensive  areas of septal thickening, subpleural reticulation, parenchymal banding, traction bronchiectasis and honeycombing, compatible with interstitial lung disease (likely UIP). However, today's study demonstrates new consolidative changes in the right lower lobe, concerning for superimposed bronchopneumonia. No pleural effusions. Upper Abdomen: Capsular calcifications in the spleen, likely sequela of remote trauma. Aortic atherosclerosis. Musculoskeletal: There are no aggressive appearing lytic or blastic lesions noted in the visualized portions of the skeleton. Review of the MIP images confirms the above findings. IMPRESSION: 1. Right lower lobe bronchopneumonia. 2. 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 3. Dilatation of the pulmonic trunk, indicative of associated pulmonary arterial hypertension. 4. Aortic atherosclerosis, in addition to left main and 2 vessel coronary artery disease. Electronically Signed   By: Trudie Reed M.D.   On: 08/15/2016 16:19     CBC  Recent Labs Lab 08/15/16 1455 08/16/16 0432 08/17/16 0354  WBC 14.1* 14.3* 17.7*  HGB 13.7 13.3 12.5*  HCT 40.6 39.5* 36.9*  PLT 243 224 241  MCV 89.1 90.0 88.8  MCH 30.1 30.3 30.1  MCHC 33.8 33.7 33.9  RDW 14.2 14.0 14.0  LYMPHSABS 1.3  --   --   MONOABS 1.4*  --   --   EOSABS 0.2  --   --   BASOSABS 0.0  --   --     Chemistries   Recent Labs Lab 08/15/16 1455 08/16/16 0432 08/17/16 0354  NA 138 138 138  K 3.5 3.8 4.0  CL 104 105 107  CO2 27  24 25  GLUCOSE 106* 144* 156*  BUN 11 15 19   CREATININE 0.81 0.84 0.71  CALCIUM 8.7* 8.7* 8.6*  AST 38  --   --   ALT 26  --   --   ALKPHOS 61  --   --   BILITOT 0.7  --   --    ------------------------------------------------------------------------------------------------------------------ estimated creatinine clearance is 68.9 mL/min (by C-G formula based on SCr of 0.71  mg/dL). ------------------------------------------------------------------------------------------------------------------ No results for input(s): HGBA1C in the last 72 hours. ------------------------------------------------------------------------------------------------------------------ No results for input(s): CHOL, HDL, LDLCALC, TRIG, CHOLHDL, LDLDIRECT in the last 72 hours. ------------------------------------------------------------------------------------------------------------------ No results for input(s): TSH, T4TOTAL, T3FREE, THYROIDAB in the last 72 hours.  Invalid input(s): FREET3 ------------------------------------------------------------------------------------------------------------------ No results for input(s): VITAMINB12, FOLATE, FERRITIN, TIBC, IRON, RETICCTPCT in the last 72 hours.  Coagulation profile No results for input(s): INR, PROTIME in the last 168 hours.  No results for input(s): DDIMER in the last 72 hours.  Cardiac Enzymes  Recent Labs Lab 08/15/16 1455  TROPONINI <0.03   ------------------------------------------------------------------------------------------------------------------ Invalid input(s): POCBNP    Assessment & Plan   83 year old male with past medical history of coronary artery disease, hypertension, hyperlipidemia, chronic respiratory failure, history of pulmonary fibrosis who presents to the hospital due to worsening shortness of breath and cough.  1. Acute on chronic respiratory failure with hypoxia-secondary to pneumonia with underlying chronic lung disease from pulmonary fibrosis. Decrease iv steroids, scheduled DuoNeb's, Pulmicort nebs. Continue iv ceftriaxone and  Zithromax for the pneumonia. Leukocytosis likely secondary to steroids,  2. Pneumonia due to CAP  worsening respiratory failure with hypoxia. Continue iv antibiotics   3. Essential hypertension-continue carvedilol.  4. GERD-continue Protonix.  5.  Hyperlipidemia-continue Crestor.  #6/coronary artery disease: Previous stent in 2008. CT chest showed coronary calcifications in the left main. [facility] cardiology Dr. [name] recommends outpatient cardiac catheter to evaluate for cardiac status     Code Status Orders        Start     Ordered   08/15/16 1755  Full code  Continuous     08/15/16 1754    Code Status History    Date Active Date Inactive Code Status Order ID Comments User Context   03/20/2015  4:04 PM 03/22/2015  6:47 PM Full Code [id number]  Alford Highland, MD Inpatient           Consults   Cardiology, and pulmonary   DVT Prophylaxis  Lovenox   Lab Results  Component Value Date   PLT 241 08/17/2016     Time Spent in minutes Greater than 50% of time spent in care coordination and counseling patient regarding the condition and plan of care.   Katha Hamming M.D on 08/17/2016 at 10:24 AM  Between 7am to 6pm - Pager - [phone]  After 6pm go to [email or web address] - password [email or web address] [facility]  [facility]   Office  [phone]

## 2016-08-18 DIAGNOSIS — R339 Retention of urine, unspecified: Secondary | ICD-10-CM

## 2016-08-18 LAB — URINALYSIS, COMPLETE (UACMP) WITH MICROSCOPIC
BILIRUBIN URINE: NEGATIVE
Bacteria, UA: NONE SEEN
Glucose, UA: NEGATIVE mg/dL
Ketones, ur: NEGATIVE mg/dL
Leukocytes, UA: NEGATIVE
NITRITE: NEGATIVE
PH: 6 (ref 5.0–8.0)
Protein, ur: NEGATIVE mg/dL
SPECIFIC GRAVITY, URINE: 1.012 (ref 1.005–1.030)
Squamous Epithelial / LPF: NONE SEEN

## 2016-08-18 MED ORDER — FINASTERIDE 5 MG PO TABS
5.0000 mg | ORAL_TABLET | Freq: Every day | ORAL | Status: DC
  Administered 2016-08-18 – 2016-08-19 (×2): 5 mg via ORAL
  Filled 2016-08-18 (×2): qty 1

## 2016-08-18 MED ORDER — IPRATROPIUM-ALBUTEROL 0.5-2.5 (3) MG/3ML IN SOLN
3.0000 mL | RESPIRATORY_TRACT | Status: DC | PRN
  Administered 2016-08-18: 19:00:00 3 mL via RESPIRATORY_TRACT
  Filled 2016-08-18: qty 3

## 2016-08-18 MED ORDER — TAMSULOSIN HCL 0.4 MG PO CAPS
0.4000 mg | ORAL_CAPSULE | Freq: Every day | ORAL | Status: DC
  Administered 2016-08-18 – 2016-08-19 (×2): 0.4 mg via ORAL
  Filled 2016-08-18 (×2): qty 1

## 2016-08-18 MED ORDER — MORPHINE SULFATE (PF) 2 MG/ML IV SOLN
1.0000 mg | Freq: Once | INTRAVENOUS | Status: AC
  Administered 2016-08-18: 1 mg via INTRAVENOUS
  Filled 2016-08-18: qty 1

## 2016-08-18 NOTE — Care Management Important Message (Signed)
Important Message  Patient Details  Name: Laurin CoderMohammad A Ikner MRN: [id number] Date of Birth: [birth year]   Medicare Important Message Given:  Yes    Gwenette GreetBrenda S Lanisa Ishler, RN 08/18/2016, 1:28 PM

## 2016-08-18 NOTE — Progress Notes (Signed)
Pt requesting to see a neurologist for "Difficulti starting a urine stream" will pass this message on to the incoming nurse.

## 2016-08-18 NOTE — Consult Note (Signed)
Urology Consult  I have been asked to see the patient by Dr. Luberta Mutter, for evaluation and management of urinary retention.  Chief Complaint: difficulty voiding  History of Present Illness: Vincent Kennedy is a 83 y.o. year old male who is retired Development worker, international [employer] currently admitted with pneumonia and shortness of breath. During this admission, he said increasing difficulty urinating and was straight cathetered for greater than 800 cc earlier this morning.  He reports that over the past few months, he has had worsening issues with urinary urgency. He only gets up once at night to void. He does have occasional postvoid dribbling. He notes that he is felt that his lower abdomen has been distended for the past several weeks and is concerned that he wasn't emptying his bladder completely.  Over the past few days during this admission, he had increasing difficulty voiding and starting his urinary stream. Prior to being catheterized today, he was extremely uncomfortable. He denies any dysuria or gross hematuria. He denies a personal history of urinary retention.  He is currently a patient of Dr. cope at [facility].  He was recently seen and evaluated on 08/2015 for elevated PSA. At that time, PSA was 7.2, stable from 10 years ago. He also has a history of previous prostate biopsy 25 years ago at [facility] which was negative. He does have a history of BPH with minimal lites, previously briefly on Avodart and a beta blocker but is currently on no BPH medications.  Rectal exam at his last evaluation 60 cc symmetric without nodules. Postvoid residual at that visit 86 mL's.    Past Medical History:  Diagnosis Date  . Abnormal nuclear cardiac imaging test    Dr. [name]  . Cataract   . Coronary artery disease    Cardiac cath September 2008: 20% left main stenosis, 40% mid LAD stenosis, 99% ostial first diagonal stenosis in a large branch and 90% ostial disease in second diagonal but the  vessel was very small, 20% mid RCA stenosis with normal ejection fraction. Successful angioplasty and drug-eluting stent placement to the ostial first diagonal with a 2.5 x 15 mm Xience stent  . Hyperlipidemia   . Hypertension    BP has never been high(per pt).  Metoprolol is to keep BP low because of stent (afterload)  . Oxygen deficiency   . Pulmonary fibrosis (HCC) 08/26/2015  . Shortness of breath dyspnea    with exertion  . Sleep apnea    has not used CPAP recently.  Feels condition has improved.  . Wears hearing aid    (sometimes)    Past Surgical History:  Procedure Laterality Date  . CARDIAC CATHETERIZATION     2010  . CATARACT EXTRACTION W/PHACO Right 01/29/2015   Procedure: CATARACT EXTRACTION PHACO AND INTRAOCULAR LENS PLACEMENT (IOC);  Surgeon: Lockie Mola, MD;  Location: [facility];  Service: Ophthalmology;  Laterality: Right;  RESTOR LENS CPAP  . CATARACT EXTRACTION W/PHACO Left 03/12/2015   Procedure: CATARACT EXTRACTION PHACO AND INTRAOCULAR LENS PLACEMENT (IOC);  Surgeon: Lockie Mola, MD;  Location: [facility];  Service: Ophthalmology;  Laterality: Left;  RESTOR SHUGARCAINE  . COLONOSCOPY WITH ESOPHAGOGASTRODUODENOSCOPY (EGD)    . CORONARY ANGIOPLASTY  2010  . EYE SURGERY     cataract  . HERNIA REPAIR Bilateral    inguinal    Home Medications:  Current Meds  Medication Sig  . albuterol (PROVENTIL HFA;VENTOLIN HFA) 108 (90 Base) MCG/ACT inhaler Inhale 2 puffs into  the lungs every 6 (six) hours as needed for wheezing or shortness of breath.  [name]. aspirin EC 325 MG tablet Take 1 tablet (325 mg total) by mouth daily.  . carvedilol (COREG) 12.5 MG tablet Take 12.5 tablets by mouth daily.  [name]. ipratropium-albuterol (DUONEB) 0.5-2.5 (3) MG/3ML SOLN Take 3 mLs by nebulization every 6 (six) hours.  [name]. levofloxacin (LEVAQUIN) 500 MG tablet Take 500 mg by mouth daily.  . mirtazapine (REMERON) 15 MG tablet Take 15 mg by mouth at bedtime.   .  mometasone-formoterol (DULERA) 100-5 MCG/ACT AERO Inhale 2 puffs into the lungs 2 (two) times daily.  . pantoprazole (PROTONIX) 40 MG tablet Take 1 tablet (40 mg total) by mouth daily as needed. Caution:prolonged use may increase risk of pneumonia, colitis, osteoporosis, anemia (Patient taking differently: Take 40 mg by mouth daily. Caution:prolonged use may increase risk of pneumonia, colitis, osteoporosis, anemi)  . Pirfenidone (ESBRIET) 267 MG CAPS Take 3 capsules by mouth 3 (three) times daily.   . predniSONE (DELTASONE) 20 MG tablet Take 20 mg by mouth 3 (three) times daily.  . rivastigmine (EXELON) 9.5 mg/24hr Place 1 patch (9.5 mg total) onto the skin daily.  . rosuvastatin (CRESTOR) 40 MG tablet Take 1 tablet (40 mg total) by mouth daily.  [name]. zolpidem (AMBIEN) 5 MG tablet Take 5 mg by mouth at bedtime as needed.    Allergies: No Known Allergies  Family History  Problem Relation Age of Onset  . CAD Father   . CAD Brother     Social History:  reports that he has never smoked. He has never used smokeless tobacco. He reports that he does not drink alcohol or use drugs.  ROS: A complete review of systems was performed.  All systems are negative except for pertinent findings as noted.  Physical Exam:  Vital signs in last 24 hours: Temp:  [97.8 F (36.6 C)-98.3 F (36.8 C)] 97.8 F (36.6 C) (05/30 0356) Pulse Rate:  [59-66] 59 (05/30 0356) Resp:  [16-20] 20 (05/30 0356) BP: (121-162)/(53-70) 134/60 (05/30 0356) SpO2:  [97 %-99 %] 97 % (05/30 0413) Constitutional:  Alert and oriented, No acute distress HEENT: [place] AT, moist mucus membranes.  Trachea midline, no masses Cardiovascular: Regular rate and rhythm, no clubbing, cyanosis, or edema. Respiratory: Normal respiratory effort, wearing 02.   GI: Abdomen is soft, nontender, nondistended, no abdominal masses GU: No CVA tenderness.  Circumcised phallus with orthotopic meatus. Bilateral descended testicles, no masses, no scrotal skin  changes. Rectal exam deferred today, performed/performed by Dr. cope with notable enlargement without nodularity Skin: No rashes, bruises or suspicious lesions Neurologic: Grossly intact, no focal deficits, moving all 4 extremities Psychiatric: Normal mood and affect   Laboratory Data:   Recent Labs  08/15/16 1455 08/16/16 0432 08/17/16 0354  WBC 14.1* 14.3* 17.7*  HGB 13.7 13.3 12.5*  HCT 40.6 39.5* 36.9*    Recent Labs  08/15/16 1455 08/16/16 0432 08/17/16 0354  NA 138 138 138  K 3.5 3.8 4.0  CL 104 105 107  CO2 27 24 25   GLUCOSE 106* 144* 156*  BUN 11 15 19   CREATININE 0.81 0.84 0.71  CALCIUM 8.7* 8.7* 8.6*   No results for input(s): LABPT, INR in the last 72 hours. No results for input(s): LABURIN in the last 72 hours. Results for orders placed or performed during the hospital encounter of 08/15/16  Culture, blood (routine x 2)     Status: None (Preliminary result)   Collection Time: 08/15/16  3:02  PM  Result Value Ref Range Status   Specimen Description BLOOD LEFT AC  Final   Special Requests BOTTLES DRAWN AEROBIC AND ANAEROBIC BCHV  Final   Culture NO GROWTH 3 DAYS  Final   Report Status PENDING  Incomplete  Culture, blood (routine x 2)     Status: None (Preliminary result)   Collection Time: 08/15/16  3:11 PM  Result Value Ref Range Status   Specimen Description BLOOD RIGHT AC  Final   Special Requests BOTTLES DRAWN AEROBIC AND ANAEROBIC BCHV  Final   Culture NO GROWTH 3 DAYS  Final   Report Status PENDING  Incomplete     Radiologic Imaging: No results found.  Impression/ Plan: 83 year old male admitted with pneumonia, complications from pulmonary fibrosis found to have marked urinary retention, 800 cc in his bladder at the time of straight catheter with increasing difficulty voiding prior to this.  Creatinine is preserved.  1) Acute urinary retention- acute versus subacute.  -Recommend initiation of Flomax/finasteride including risk and benefits as  well as mechanism of action and pharmacology.  -Given 800 cc in the bladder concerning for possible stretch injury, recommend Foley decompression for 48 hours with voiding trial followed by post void residual was catheters removed.  -If patient fails voiding trial, will arrange for outpatient voiding trial with [facility] teaching.   -UA to rule out possible underlying infection list contribute cause although unlikely -Minimize narcotics/anticholinergics and encourage ambulation  2) BPH with LUTS- as above.  History of enlarged gland as above  08/18/2016, 9:01 AM  Vanna Scotland,  MD

## 2016-08-18 NOTE — Progress Notes (Signed)
[facility] -  at [facility]                                                                                                                                                                                  Patient Demographics   Vincent Kennedy, is a 83 y.o. male, DOB - [birth year], ZOX:[id number]  Admit date - 08/15/2016   Admitting Physician Houston Siren, MD  Outpatient Primary MD for the patient is Yevonne Pax, MD   LOS - 3  Subjective: Had  urinary retention with bladder scan showing 800 and of urine, patient had in and out catheter, urologist Dr. Apolinar Junes saw the patient and she recommended Foley for 48 hours. Patient was uncomfortable last night and this morning because of urinary retention and now he feels better after primary is emptied. A Foley catheter to be inserted today, wanted to monitor today and patient would like to go home tomorrow. No shortness of breath.  Review of Systems:   CONSTITUTIONAL: No documented fever. No fatigue, weakness. No weight gain, no weight loss.  EYES: No blurry or double vision.  ENT: No tinnitus. No postnasal drip. No redness of the oropharynx.  RESPIRATORY: + cough, no wheeze, no hemoptysis. No dyspnea.  CARDIOVASCULAR: No chest pain. No orthopnea. No palpitations. No syncope.  GASTROINTESTINAL: No nausea, no vomiting or diarrhea. No abdominal pain. No melena or hematochezia.  GENITOURINARY: No dysuria or hematuria.  ENDOCRINE: No polyuria or nocturia. No heat or cold intolerance.  HEMATOLOGY: No anemia. No bruising. No bleeding.  INTEGUMENTARY: No rashes. No lesions.  MUSCULOSKELETAL: No arthritis. No swelling. No gout.  NEUROLOGIC: No numbness, tingling, or ataxia. No seizure-type activity.  PSYCHIATRIC: No anxiety. No insomnia. No ADD.    Vitals:   Vitals:   08/17/16 2026 08/18/16 0356 08/18/16 0413 08/18/16 0903  BP: 125/63 134/60  135/65  Pulse: 62 (!) 59  60  Resp: 20 20  20   Temp: 98.3 F (36.8 C) 97.8  F (36.6 C)  97.6 F (36.4 C)  TempSrc: Oral Oral  Oral  SpO2: 97% 99% 97% 98%  Weight:      Height:        Wt Readings from Last 3 Encounters:  08/15/16 70.3 kg (154 lb 14.4 oz)  04/26/16 70.5 kg (155 lb 7 oz)  02/03/16 72 kg (158 lb 12 oz)     Intake/Output Summary (Last 24 hours) at 08/18/16 1403 Last data filed at 08/18/16 0827  Gross per 24 hour  Intake              240 ml  Output  800 ml  Net             -560 ml    Physical Exam:   GENERAL: Pleasant-appearing in no apparent distress.  HEAD, EYES, EARS, NOSE AND THROAT: Atraumatic, normocephalic. Extraocular muscles are intact. Pupils equal and reactive to light. Sclerae anicteric. No conjunctival injection. No oro-pharyngeal erythema.  NECK: Supple. There is no jugular venous distention. No bruits, no lymphadenopathy, no thyromegaly.  HEART: Regular rate and rhythm,. No murmurs, no rubs, no clicks.  LUNGS:  Rhonchus breath sounds at the right base. No  Accessory  muscle usage .no wheezing or crackles ABDOMEN: Soft, flat, nontender, nondistended. Has good bowel sounds. No hepatosplenomegaly appreciated.  EXTREMITIES: No evidence of any cyanosis, clubbing, or peripheral edema.  +2 pedal and radial pulses bilaterally.  NEUROLOGIC: The patient is alert, awake, and oriented x3 with no focal motor or sensory deficits appreciated bilaterally.  SKIN: Moist and warm with no rashes appreciated.  Psych: Not anxious, depressed LN: No inguinal LN enlargement    Antibiotics   Anti-infectives    Start     Dose/Rate Route Frequency Ordered Stop   08/18/16 1000  azithromycin (ZITHROMAX) tablet 250 mg     250 mg Oral Daily 08/17/16 1535 08/22/16 0959   08/16/16 1800  cefTRIAXone (ROCEPHIN) 1 g in dextrose 5 % 50 mL IVPB     1 g 100 mL/hr over 30 Minutes Intravenous Every 24 hours 08/15/16 1754     08/16/16 1000  azithromycin (ZITHROMAX) tablet 250 mg  Status:  Discontinued     250 mg Oral Daily 08/15/16 1754 08/17/16  1535   08/15/16 1515  cefTRIAXone (ROCEPHIN) 1 g in dextrose 5 % 50 mL IVPB - Premix     1 g 100 mL/hr over 30 Minutes Intravenous  Once 08/15/16 1505 08/15/16 1547   08/15/16 1515  azithromycin (ZITHROMAX) 500 mg in dextrose 5 % 250 mL IVPB     500 mg 250 mL/hr over 60 Minutes Intravenous  Once 08/15/16 1505 08/15/16 1900      Medications   Scheduled Meds: . aspirin EC  81 mg Oral Daily  . azithromycin  250 mg Oral Daily  . budesonide (PULMICORT) nebulizer solution  0.5 mg Nebulization BID  . carvedilol  6.25 mg Oral BID WC  . ciprofloxacin  2 drop Left Eye Q4H while awake  . enoxaparin (LOVENOX) injection  40 mg Subcutaneous Q24H  . finasteride  5 mg Oral Daily  . fluticasone  1 spray Each Nare Daily  . guaiFENesin  600 mg Oral BID  . mouth rinse  15 mL Mouth Rinse BID  . methylPREDNISolone (SOLU-MEDROL) injection  40 mg Intravenous Q12H  . mirtazapine  15 mg Oral QHS  . pantoprazole  40 mg Oral Daily  . Pirfenidone  801 mg Oral TID  . rosuvastatin  40 mg Oral Daily  . tamsulosin  0.4 mg Oral Daily   Continuous Infusions: . cefTRIAXone (ROCEPHIN)  IV Stopped (08/17/16 1847)   PRN Meds:.acetaminophen **OR** acetaminophen, ipratropium-albuterol, menthol-cetylpyridinium, ondansetron **OR** ondansetron (ZOFRAN) IV, zolpidem   Data Review:   Micro Results Recent Results (from the past 240 hour(s))  Culture, blood (routine x 2)     Status: None (Preliminary result)   Collection Time: 08/15/16  3:02 PM  Result Value Ref Range Status   Specimen Description BLOOD LEFT AC  Final   Special Requests BOTTLES DRAWN AEROBIC AND ANAEROBIC BCHV  Final   Culture NO GROWTH 3 DAYS  Final  Report Status PENDING  Incomplete  Culture, blood (routine x 2)     Status: None (Preliminary result)   Collection Time: 08/15/16  3:11 PM  Result Value Ref Range Status   Specimen Description BLOOD RIGHT AC  Final   Special Requests BOTTLES DRAWN AEROBIC AND ANAEROBIC BCHV  Final   Culture NO  GROWTH 3 DAYS  Final   Report Status PENDING  Incomplete    Radiology Reports Dg Chest 2 View  Result Date: 08/10/2016 CLINICAL DATA:  Productive cough. History of COPD. Pulmonary fibrosis. EXAM: CHEST  2 VIEW COMPARISON:  CT 11/10/2015.  Chest x-ray 10/06/2015. FINDINGS: Mediastinum and hilar structures are stable. Stable mild cardiomegaly. No pulmonary venous congestion. Stable bilateral chronic interstitial changes consistent with interstitial fibrosis. As noted on prior studies active interstitial lung disease cannot excluded. No evidence of progression of interstitial changes. No pleural effusion or pneumothorax. Thoracic spine scoliosis. Diffuse osteopenia and degenerative change thoracic spine. Aortic atherosclerotic vascular calcification. IMPRESSION: 1. Chronic interstitial lung disease, stable in appearance from prior exam. No acute pulmonary infiltrate noted. 2.  Stable mild cardiomegaly.  No pulmonary venous congestion. 3.  Aortic atherosclerotic vascular disease. Electronically Signed   By: Maisie Fus  Register   On: 08/10/2016 16:02   Ct Angio Chest Pe W Or Wo Contrast  Result Date: 08/15/2016 CLINICAL DATA:  83 year old male with history of pulmonary fibrosis on oxygen, with increasing shortness of breath and cough. EXAM: CT ANGIOGRAPHY CHEST WITH CONTRAST TECHNIQUE: Multidetector CT imaging of the chest was performed using the standard protocol during bolus administration of intravenous contrast. Multiplanar CT image reconstructions and MIPs were obtained to evaluate the vascular anatomy. CONTRAST:  75 mL of Isovue 370. COMPARISON:  Chest CT 11/10/2015. FINDINGS: Cardiovascular: No filling defects within the pulmonary arterial tree to suggest underlying pulmonary embolism. Heart size is normal. There is no significant pericardial fluid, thickening or pericardial calcification. Dilatation of the pulmonic trunk (4.1 cm in diameter), indicative of pulmonary arterial hypertension. There is aortic  atherosclerosis, as well as atherosclerosis of the great vessels of the mediastinum and the coronary arteries, including calcified atherosclerotic plaque in the left main, left anterior descending and right coronary arteries. Mediastinum/Nodes: Numerous borderline enlarged and enlarged mediastinal and bilateral hilar lymph nodes measuring up to 15 mm in short axis in the low right paratracheal nodal station and 14 mm in short axis in the right hilar region some of these lymph nodes are densely calcified. Esophagus is unremarkable in appearance. No axillary lymphadenopathy. Lungs/Pleura: Again noted are extensive areas of septal thickening, subpleural reticulation, parenchymal banding, traction bronchiectasis and honeycombing, compatible with interstitial lung disease (likely UIP). However, today's study demonstrates new consolidative changes in the right lower lobe, concerning for superimposed bronchopneumonia. No pleural effusions. Upper Abdomen: Capsular calcifications in the spleen, likely sequela of remote trauma. Aortic atherosclerosis. Musculoskeletal: There are no aggressive appearing lytic or blastic lesions noted in the visualized portions of the skeleton. Review of the MIP images confirms the above findings. IMPRESSION: 1. Right lower lobe bronchopneumonia. 2. 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 3. Dilatation of the pulmonic trunk, indicative of associated pulmonary arterial hypertension. 4. Aortic atherosclerosis, in addition to left main and 2 vessel coronary artery disease. Electronically Signed   By: Trudie Reed M.D.   On: 08/15/2016 16:19     CBC  Recent Labs Lab 08/15/16 1455 08/16/16 0432 08/17/16 0354  WBC 14.1* 14.3* 17.7*  HGB 13.7 13.3 12.5*  HCT 40.6 39.5* 36.9*  PLT 243 224 241  MCV 89.1 90.0 88.8  MCH 30.1 30.3 30.1  MCHC 33.8 33.7 33.9  RDW 14.2 14.0 14.0  LYMPHSABS  1.3  --   --   MONOABS 1.4*  --   --   EOSABS 0.2  --   --   BASOSABS 0.0  --   --     Chemistries   Recent Labs Lab 08/15/16 1455 08/16/16 0432 08/17/16 0354  NA 138 138 138  K 3.5 3.8 4.0  CL 104 105 107  CO2 27 24 25   GLUCOSE 106* 144* 156*  BUN 11 15 19   CREATININE 0.81 0.84 0.71  CALCIUM 8.7* 8.7* 8.6*  AST 38  --   --   ALT 26  --   --   ALKPHOS 61  --   --   BILITOT 0.7  --   --    ------------------------------------------------------------------------------------------------------------------ estimated creatinine clearance is 68.9 mL/min (by C-G formula based on SCr of 0.71 mg/dL). ------------------------------------------------------------------------------------------------------------------ No results for input(s): HGBA1C in the last 72 hours. ------------------------------------------------------------------------------------------------------------------ No results for input(s): CHOL, HDL, LDLCALC, TRIG, CHOLHDL, LDLDIRECT in the last 72 hours. ------------------------------------------------------------------------------------------------------------------ No results for input(s): TSH, T4TOTAL, T3FREE, THYROIDAB in the last 72 hours.  Invalid input(s): FREET3 ------------------------------------------------------------------------------------------------------------------ No results for input(s): VITAMINB12, FOLATE, FERRITIN, TIBC, IRON, RETICCTPCT in the last 72 hours.  Coagulation profile No results for input(s): INR, PROTIME in the last 168 hours.  No results for input(s): DDIMER in the last 72 hours.  Cardiac Enzymes  Recent Labs Lab 08/15/16 1455  TROPONINI <0.03   ------------------------------------------------------------------------------------------------------------------ Invalid input(s): POCBNP    Assessment & Plan   83 year old male with past medical history of coronary artery disease, hypertension, hyperlipidemia, chronic  respiratory failure, history of pulmonary fibrosis who presents to the hospital due to worsening shortness of breath and cough.  1. Acute on chronic respiratory failure with hypoxia-secondary to pneumonia with underlying chronic lung disease from pulmonary fibrosis. Decrease iv steroids, scheduled DuoNeb's, Pulmicort nebs. Continue iv ceftriaxone and  Zithromax for the pneumonia. Leukocytosis likely secondary to steroids, Patient is clinically better. Likely discharge home tomorrow with by mouth antibiotics and the prednisone dose taper  2. Pneumonia due to CAP  worsening respiratory failure with hypoxia. Continue iv antibiotics   3. Essential hypertension-continue carvedilol.  4. GERD-continue Protonix.  5. Hyperlipidemia-continue Crestor.  #6/coronary artery disease: Previous stent in 2008. CT chest showed coronary calcifications in the left main. [facility] cardiology Dr. [name] recommends outpatient cardiac catheter to evaluate for cardiac status, echocardiogram shows EF more than 55% with mild pulmonary hypertension., No wall motion abnormality #7 acute urinary retention in the contest of chronic urine troubles and also BPH; seen by urology recommended Foley for 48 hours and prior training as an outpatient patient prefers to stay tonight today and wants to go homedischarged home tomorrow, continue Flomax and finasteride.     Code Status Orders        Start     Ordered   08/15/16 1755  Full code  Continuous     08/15/16 1754    Code Status History    Date Active Date Inactive Code Status Order ID Comments User Context   03/20/2015  4:04 PM 03/22/2015  6:47 PM Full Code [id number]  Alford Highland, MD Inpatient           Consults   Cardiology, and pulmonary   DVT Prophylaxis  Lovenox   Lab Results  Component Value Date   PLT 241 08/17/2016     Time Spent in minutes Greater than 50% of time spent in care coordination and counseling patient regarding the  condition and plan of care.   Katha Hamming M.D on 08/18/2016 at 2:03 PM  Between 7am to 6pm - Pager - [phone]  After 6pm go to [email or web address] - password [email or web address] [facility]  [facility]   Office  [phone]

## 2016-08-19 ENCOUNTER — Telehealth: Payer: Self-pay | Admitting: Urology

## 2016-08-19 ENCOUNTER — Telehealth: Payer: Self-pay | Admitting: *Deleted

## 2016-08-19 LAB — PROCALCITONIN: Procalcitonin: <0.1 ng/mL

## 2016-08-19 MED ORDER — LIDOCAINE HCL 2 % EX GEL
1.0000 "application " | Freq: Once | CUTANEOUS | Status: DC
  Filled 2016-08-19: qty 5

## 2016-08-19 MED ORDER — CARVEDILOL 12.5 MG PO TABS: 12.5000 mg | ORAL_TABLET | Freq: Every day | ORAL | 11 refills | Status: DC

## 2016-08-19 MED ORDER — TAMSULOSIN HCL 0.4 MG PO CAPS
0.4000 mg | ORAL_CAPSULE | Freq: Every day | ORAL | 0 refills | Status: DC
Start: 2016-08-19 — End: 2016-09-16

## 2016-08-19 MED ORDER — BUDESONIDE 0.5 MG/2ML IN SUSP: 0.5000 mg | Freq: Two times a day (BID) | RESPIRATORY_TRACT | 12 refills | Status: DC

## 2016-08-19 MED ORDER — MIRTAZAPINE 15 MG PO TABS
15.0000 mg | ORAL_TABLET | Freq: Every day | ORAL | 11 refills | Status: DC
Start: 2016-08-19 — End: 2017-02-04

## 2016-08-19 MED ORDER — AMOXICILLIN-POT CLAVULANATE 875-125 MG PO TABS: 1.0000 | ORAL_TABLET | Freq: Two times a day (BID) | ORAL | 0 refills | Status: AC

## 2016-08-19 MED ORDER — AZITHROMYCIN 250 MG PO TABS: ORAL_TABLET | ORAL | 0 refills | Status: DC

## 2016-08-19 MED ORDER — FINASTERIDE 5 MG PO TABS: 5.0000 mg | ORAL_TABLET | Freq: Every day | ORAL | 0 refills | Status: DC

## 2016-08-19 MED ORDER — LIDOCAINE HCL 2 % EX GEL: 1.0000 "application " | Freq: Once | CUTANEOUS | 0 refills | Status: AC

## 2016-08-19 MED ORDER — PREDNISONE 10 MG (21) PO TBPK: ORAL_TABLET | ORAL | 0 refills | Status: DC

## 2016-08-19 NOTE — Telephone Encounter (Signed)
That's great.  Also, can you arrange for MD visit with me in about 4 weeks for UA/ PVR?  Vanna ScotlandAshley Shuntell Foody, MD

## 2016-08-19 NOTE — Telephone Encounter (Signed)
Left voice mail message with appointment information

## 2016-08-19 NOTE — Telephone Encounter (Signed)
-----   Message from Coralee RudSabrina F Gilley sent at 08/19/2016  9:43 AM EDT ----- Regarding: tcm/ph 6/12 4:20 Dr.Arida

## 2016-08-19 NOTE — Telephone Encounter (Signed)
Patient still admitted at this time. 

## 2016-08-19 NOTE — Telephone Encounter (Signed)
Left voicemail message with appointment date and time

## 2016-08-19 NOTE — Telephone Encounter (Signed)
I didn't have anywhere to put this patient so I put him on the nurse schedule is this ok? It said for a void and trial and [facility] teaching.  Vincent DusterMichelle

## 2016-08-19 NOTE — Progress Notes (Signed)
[employer] - [place] at [facility]                                                                                                                                                                                  Patient Demographics   Muaaz Brau, is a 83 y.o. male, DOB - 09-20-1932, ZOX:[id number]  Admit date - 08/15/2016   Admitting Physician Houston Siren, MD  Outpatient Primary MD for the patient is Yevonne Pax, MD   LOS - 4  Subjective: Discharged home today with Foley. No shortness of breath and eager to go home.  Review of Systems:   CONSTITUTIONAL: No documented fever. No fatigue, weakness. No weight gain, no weight loss.  EYES: No blurry or double vision.  ENT: No tinnitus. No postnasal drip. No redness of the oropharynx.  RESPIRATORY: + cough, no wheeze, no hemoptysis. No dyspnea.  CARDIOVASCULAR: No chest pain. No orthopnea. No palpitations. No syncope.  GASTROINTESTINAL: No nausea, no vomiting or diarrhea. No abdominal pain. No melena or hematochezia.  GENITOURINARY: No dysuria or hematuria.  ENDOCRINE: No polyuria or nocturia. No heat or cold intolerance.  HEMATOLOGY: No anemia. No bruising. No bleeding.  INTEGUMENTARY: No rashes. No lesions.  MUSCULOSKELETAL: No arthritis. No swelling. No gout.  NEUROLOGIC: No numbness, tingling, or ataxia. No seizure-type activity.  PSYCHIATRIC: No anxiety. No insomnia. No ADD.    Vitals:   Vitals:   08/19/16 0356 08/19/16 0732 08/19/16 1017 08/19/16 1018  BP: 122/61  (!) 141/64 (!) 141/64  Pulse: (!) 55  63 60  Resp: 19   20  Temp: 97.9 F (36.6 C)     TempSrc: Oral     SpO2: 97% 99%  99%  Weight:      Height:        Wt Readings from Last 3 Encounters:  08/15/16 70.3 kg (154 lb 14.4 oz)  04/26/16 70.5 kg (155 lb 7 oz)  02/03/16 72 kg (158 lb 12 oz)     Intake/Output Summary (Last 24 hours) at 08/19/16 1321 Last data filed at 08/19/16 1100  Gross per 24 hour  Intake             1010 ml   Output             1150 ml  Net             -140 ml    Physical Exam:   GENERAL: Pleasant-appearing in no apparent distress.  HEAD, EYES, EARS, NOSE AND THROAT: Atraumatic, normocephalic. Extraocular muscles are intact. Pupils equal and reactive to light. Sclerae anicteric. No conjunctival injection. No oro-pharyngeal erythema.  NECK: Supple. There is no jugular  venous distention. No bruits, no lymphadenopathy, no thyromegaly.  HEART: Regular rate and rhythm,. No murmurs, no rubs, no clicks.  LUNGS:  Rhonchus breath sounds at the right base. No  Accessory  muscle usage .no wheezing or crackles ABDOMEN: Soft, flat, nontender, nondistended. Has good bowel sounds. No hepatosplenomegaly appreciated.  EXTREMITIES: No evidence of any cyanosis, clubbing, or peripheral edema.  +2 pedal and radial pulses bilaterally.  NEUROLOGIC: The patient is alert, awake, and oriented x3 with no focal motor or sensory deficits appreciated bilaterally.  SKIN: Moist and warm with no rashes appreciated.  Psych: Not anxious, depressed LN: No inguinal LN enlargement    Antibiotics   Anti-infectives    Start     Dose/Rate Route Frequency Ordered Stop   08/19/16 0000  azithromycin (ZITHROMAX) 250 MG tablet        08/19/16 0749     08/19/16 0000  amoxicillin-clavulanate (AUGMENTIN) 875-125 MG tablet     1 tablet Oral 2 times daily 08/19/16 0749 09/02/16 2359   08/18/16 1000  azithromycin (ZITHROMAX) tablet 250 mg     250 mg Oral Daily 08/17/16 1535 08/22/16 0959   08/16/16 1800  cefTRIAXone (ROCEPHIN) 1 g in dextrose 5 % 50 mL IVPB     1 g 100 mL/hr over 30 Minutes Intravenous Every 24 hours 08/15/16 1754     08/16/16 1000  azithromycin (ZITHROMAX) tablet 250 mg  Status:  Discontinued     250 mg Oral Daily 08/15/16 1754 08/17/16 1535   08/15/16 1515  cefTRIAXone (ROCEPHIN) 1 g in dextrose 5 % 50 mL IVPB - Premix     1 g 100 mL/hr over 30 Minutes Intravenous  Once 08/15/16 1505 08/15/16 1547   08/15/16 1515   azithromycin (ZITHROMAX) 500 mg in dextrose 5 % 250 mL IVPB     500 mg 250 mL/hr over 60 Minutes Intravenous  Once 08/15/16 1505 08/15/16 1900      Medications   Scheduled Meds: . aspirin EC  81 mg Oral Daily  . azithromycin  250 mg Oral Daily  . budesonide (PULMICORT) nebulizer solution  0.5 mg Nebulization BID  . carvedilol  6.25 mg Oral BID WC  . ciprofloxacin  2 drop Left Eye Q4H while awake  . enoxaparin (LOVENOX) injection  40 mg Subcutaneous Q24H  . finasteride  5 mg Oral Daily  . fluticasone  1 spray Each Nare Daily  . guaiFENesin  600 mg Oral BID  . lidocaine  1 application Urethral Once  . mouth rinse  15 mL Mouth Rinse BID  . methylPREDNISolone (SOLU-MEDROL) injection  40 mg Intravenous Q12H  . mirtazapine  15 mg Oral QHS  . pantoprazole  40 mg Oral Daily  . Pirfenidone  801 mg Oral TID  . rosuvastatin  40 mg Oral Daily  . tamsulosin  0.4 mg Oral Daily   Continuous Infusions: . cefTRIAXone (ROCEPHIN)  IV Stopped (08/18/16 1836)   PRN Meds:.acetaminophen **OR** acetaminophen, ipratropium-albuterol, menthol-cetylpyridinium, ondansetron **OR** ondansetron (ZOFRAN) IV, zolpidem   Data Review:   Micro Results Recent Results (from the past 240 hour(s))  Culture, blood (routine x 2)     Status: None (Preliminary result)   Collection Time: 08/15/16  3:02 PM  Result Value Ref Range Status   Specimen Description BLOOD LEFT AC  Final   Special Requests BOTTLES DRAWN AEROBIC AND ANAEROBIC BCHV  Final   Culture NO GROWTH 4 DAYS  Final   Report Status PENDING  Incomplete  Culture, blood (routine x 2)  Status: None (Preliminary result)   Collection Time: 08/15/16  3:11 PM  Result Value Ref Range Status   Specimen Description BLOOD RIGHT AC  Final   Special Requests BOTTLES DRAWN AEROBIC AND ANAEROBIC BCHV  Final   Culture NO GROWTH 4 DAYS  Final   Report Status PENDING  Incomplete    Radiology Reports Dg Chest 2 View  Result Date: 08/10/2016 CLINICAL DATA:   Productive cough. History of COPD. Pulmonary fibrosis. EXAM: CHEST  2 VIEW COMPARISON:  CT 11/10/2015.  Chest x-ray 10/06/2015. FINDINGS: Mediastinum and hilar structures are stable. Stable mild cardiomegaly. No pulmonary venous congestion. Stable bilateral chronic interstitial changes consistent with interstitial fibrosis. As noted on prior studies active interstitial lung disease cannot excluded. No evidence of progression of interstitial changes. No pleural effusion or pneumothorax. Thoracic spine scoliosis. Diffuse osteopenia and degenerative change thoracic spine. Aortic atherosclerotic vascular calcification. IMPRESSION: 1. Chronic interstitial lung disease, stable in appearance from prior exam. No acute pulmonary infiltrate noted. 2.  Stable mild cardiomegaly.  No pulmonary venous congestion. 3.  Aortic atherosclerotic vascular disease. Electronically Signed   By: Maisie Fus  Register   On: 08/10/2016 16:02   Ct Angio Chest Pe W Or Wo Contrast  Result Date: 08/15/2016 CLINICAL DATA:  83 year old male with history of pulmonary fibrosis on oxygen, with increasing shortness of breath and cough. EXAM: CT ANGIOGRAPHY CHEST WITH CONTRAST TECHNIQUE: Multidetector CT imaging of the chest was performed using the standard protocol during bolus administration of intravenous contrast. Multiplanar CT image reconstructions and MIPs were obtained to evaluate the vascular anatomy. CONTRAST:  75 mL of Isovue 370. COMPARISON:  Chest CT 11/10/2015. FINDINGS: Cardiovascular: No filling defects within the pulmonary arterial tree to suggest underlying pulmonary embolism. Heart size is normal. There is no significant pericardial fluid, thickening or pericardial calcification. Dilatation of the pulmonic trunk (4.1 cm in diameter), indicative of pulmonary arterial hypertension. There is aortic atherosclerosis, as well as atherosclerosis of the great vessels of the mediastinum and the coronary arteries, including calcified  atherosclerotic plaque in the left main, left anterior descending and right coronary arteries. Mediastinum/Nodes: Numerous borderline enlarged and enlarged mediastinal and bilateral hilar lymph nodes measuring up to 15 mm in short axis in the low right paratracheal nodal station and 14 mm in short axis in the right hilar region some of these lymph nodes are densely calcified. Esophagus is unremarkable in appearance. No axillary lymphadenopathy. Lungs/Pleura: Again noted are extensive areas of septal thickening, subpleural reticulation, parenchymal banding, traction bronchiectasis and honeycombing, compatible with interstitial lung disease (likely UIP). However, today's study demonstrates new consolidative changes in the right lower lobe, concerning for superimposed bronchopneumonia. No pleural effusions. Upper Abdomen: Capsular calcifications in the spleen, likely sequela of remote trauma. Aortic atherosclerosis. Musculoskeletal: There are no aggressive appearing lytic or blastic lesions noted in the visualized portions of the skeleton. Review of the MIP images confirms the above findings. IMPRESSION: 1. Right lower lobe bronchopneumonia. 2. 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 3. Dilatation of the pulmonic trunk, indicative of associated pulmonary arterial hypertension. 4. Aortic atherosclerosis, in addition to left main and 2 vessel coronary artery disease. Electronically Signed   By: Trudie Reed M.D.   On: 08/15/2016 16:19     CBC  Recent Labs Lab 08/15/16 1455 08/16/16 0432 08/17/16 0354  WBC 14.1* 14.3* 17.7*  HGB 13.7 13.3 12.5*  HCT 40.6 39.5* 36.9*  PLT 243 224 241  MCV 89.1 90.0 88.8  MCH 30.1 30.3 30.1  MCHC 33.8 33.7 33.9  RDW 14.2 14.0 14.0  LYMPHSABS 1.3  --   --   MONOABS 1.4*  --   --   EOSABS 0.2  --   --   BASOSABS 0.0  --   --     Chemistries   Recent Labs Lab  08/15/16 1455 08/16/16 0432 08/17/16 0354  NA 138 138 138  K 3.5 3.8 4.0  CL 104 105 107  CO2 27 24 25   GLUCOSE 106* 144* 156*  BUN 11 15 19   CREATININE 0.81 0.84 0.71  CALCIUM 8.7* 8.7* 8.6*  AST 38  --   --   ALT 26  --   --   ALKPHOS 61  --   --   BILITOT 0.7  --   --    ------------------------------------------------------------------------------------------------------------------ estimated creatinine clearance is 68.9 mL/min (by C-G formula based on SCr of 0.71 mg/dL). ------------------------------------------------------------------------------------------------------------------ No results for input(s): HGBA1C in the last 72 hours. ------------------------------------------------------------------------------------------------------------------ No results for input(s): CHOL, HDL, LDLCALC, TRIG, CHOLHDL, LDLDIRECT in the last 72 hours. ------------------------------------------------------------------------------------------------------------------ No results for input(s): TSH, T4TOTAL, T3FREE, THYROIDAB in the last 72 hours.  Invalid input(s): FREET3 ------------------------------------------------------------------------------------------------------------------ No results for input(s): VITAMINB12, FOLATE, FERRITIN, TIBC, IRON, RETICCTPCT in the last 72 hours.  Coagulation profile No results for input(s): INR, PROTIME in the last 168 hours.  No results for input(s): DDIMER in the last 72 hours.  Cardiac Enzymes  Recent Labs Lab 08/15/16 1455  TROPONINI <0.03   ------------------------------------------------------------------------------------------------------------------ Invalid input(s): POCBNP    Assessment & Plan   83 year old male with past medical history of coronary artery disease, hypertension, hyperlipidemia, chronic respiratory failure, history of pulmonary fibrosis who presents to the hospital due to worsening shortness of breath and  cough.  1. Acute on chronic respiratory failure with hypoxia-secondary to pneumonia with underlying chronic lung disease from pulmonary fibrosis. Discharged home today with Augmentin, erythromycin, prednisone dose taper, Pulmicort nebs.  2. Pneumonia due to CAP ;mproved.   3. Essential hypertension-continue carvedilol.  4. GERD-continue Protonix.  5. Hyperlipidemia-continue Crestor.  #6/coronary artery disease: Previous stent in 2008. CT chest showed coronary calcifications in the left main. [facility] Dr[facility] Kirke CorinArida recommends outpatient cardiac catheter to evaluate for cardiac status, echocardiogram shows EF more than 55% with mild pulmonary hypertension., No wall motion abnormality ,discharge him today, follow-up appointment with Dr. Kirke CorinArida  as an outpatient.  #7 acute urinary retention in the contest of chronic urine troubles and also BPH; discharge home with Foley catheter, finasteride, Flomax, follow-up with Dr. Vanna ScotlandAshley Brandon for voiding trials.     Code Status Orders        Start     Ordered   08/15/16 1755  Full code  Continuous     08/15/16 1754    Code Status History    Date Active Date Inactive Code Status Order ID Comments User Context   03/20/2015  4:04 PM 03/22/2015  6:47 PM Full Code [id number]  Alford HighlandWieting, Richard, MD Inpatient           Consults   Cardiology, and pulmonary   DVT Prophylaxis  Lovenox   Lab Results  Component Value Date   PLT 241 08/17/2016     Time Spent in minutes 35min Greater than 50% of time spent in care coordination and counseling patient regarding the condition and plan of care.   Katha HammingKONIDENA,Minda Faas M.D on 08/19/2016 at 1:21 PM  Between 7am to 6pm - Pager -  [phone]  After 6pm go to [email or web address] - password [email or web address] [facility]   Office  [phone]

## 2016-08-19 NOTE — Telephone Encounter (Signed)
[facility].  Patient understands to follow up with provider Dr. Kirke CorinArida on 08/31/16 at 4:20PM [facility]. Patient understands discharge instructions? Yes Patient understands medications and regiment? Yes Patient understands to bring all medications to this visit? Yes  Spoke with patients wife and she confirmed scheduled appointment and had no further questions at this time.

## 2016-08-19 NOTE — Progress Notes (Signed)
Discharge paperwork reviewed with patient and wife at bedside who both verbalize understanding of medication changes and new prescriptions sent electronically to [facility]. Foley catheter remains in place going home- pt to follow up with Dr. Apolinar JunesBrandon Monday, June 4. Catheter education packet given to pt. Pt and wife able to perform catheter care, switch catheter from overnight to leg bag, hygiene, and how to empty by teach back. Patient's home medication stored in pharmacy returned.   Pt requesting lidocaine jelly to put around tip of penis where catheter is irritating. Dr. Luberta MutterKonidena notified and to send prescription.

## 2016-08-20 LAB — CULTURE, BLOOD (ROUTINE X 2)
CULTURE: NO GROWTH
CULTURE: NO GROWTH

## 2016-08-20 LAB — PNEUMOCYSTIS PCR: Result Pneumocystis PCR: NEGATIVE

## 2016-08-20 NOTE — Discharge Summary (Addendum)
Vincent Kennedy, is a 83 y.o. male  DOB 06-27-1932  MRN [id number].  Admission date:  08/15/2016  Admitting Physician  Houston Siren, MD  Discharge Date; 08/19/2016  Primary MD  Yevonne Pax, MD  Recommendations for primary care physician for things to follow:   Follow up with primary doctor in one week Follow up with Dr. Vanna Scotland from urology in 1 week.   Admission Diagnosis  Dyspnea [R06.00]   Discharge Diagnosis  Dyspnea [R06.00]    Active Problems:   Pneumonia   Urinary retention      Past Medical History:  Diagnosis Date  . Abnormal nuclear cardiac imaging test    Dr. [name]  . Cataract   . Coronary artery disease    Cardiac cath September 2008: 20% left main stenosis, 40% mid LAD stenosis, 99% ostial first diagonal stenosis in a large branch and 90% ostial disease in second diagonal but the vessel was very small, 20% mid RCA stenosis with normal ejection fraction. Successful angioplasty and drug-eluting stent placement to the ostial first diagonal with a 2.5 x 15 mm Xience stent  . Hyperlipidemia   . Hypertension    BP has never been high(per pt).  Metoprolol is to keep BP low because of stent (afterload)  . Oxygen deficiency   . Pulmonary fibrosis (HCC) 08/26/2015  . Shortness of breath dyspnea    with exertion  . Sleep apnea    has not used CPAP recently.  Feels condition has improved.  . Wears hearing aid    (sometimes)    Past Surgical History:  Procedure Laterality Date  . CARDIAC CATHETERIZATION     2010  . CATARACT EXTRACTION W/PHACO Right 01/29/2015   Procedure: CATARACT EXTRACTION PHACO AND INTRAOCULAR LENS PLACEMENT (IOC);  Surgeon: Lockie Mola, MD;  Location: [facility];  Service: Ophthalmology;  Laterality: Right;  RESTOR LENS CPAP  . CATARACT EXTRACTION W/PHACO  Left 03/12/2015   Procedure: CATARACT EXTRACTION PHACO AND INTRAOCULAR LENS PLACEMENT (IOC);  Surgeon: Lockie Mola, MD;  Location: [facility];  Service: Ophthalmology;  Laterality: Left;  RESTOR SHUGARCAINE  . COLONOSCOPY WITH ESOPHAGOGASTRODUODENOSCOPY (EGD)    . CORONARY ANGIOPLASTY  2010  . EYE SURGERY     cataract  . HERNIA REPAIR Bilateral    inguinal       History of present illness and  Hospital Course:     Kindly see H&P for history of present illness and admission details, please review complete Labs, Consult reports and Test reports for all details in brief  HPI  from the history and physical done on the day of admission  83 year old male with history of coronary heart disease status post stents, hypertension, hyperlipidemia, pulmonary fibrosis on 2 L of oxygen all the time comes in because of cough, yellow sputum and shortness of breath and low-grade temperature at home with 100.4 Fahrenheit. CT chest showed right lower lobe pneumonia. Admitted for pneumonia.  Hospital Course   #Acute on chronic respiratory failure secondary to bacterial pneumonia. Patient received IV steroids, nebulizers, IV Rocephin, Zithromax. Symptoms of shortness of breath improved, hypoxia improved and discharged home back to 2 L of oxygen that he already takes at home. Discharged with Augmentin, erythromycin.  #2 essential hypertension: Controlled, continue Coreg. #3 GERD: Continue PPIs #4 .hyper Lipidemia: Continue statins #5. Urinary retention had about 800 mL of urine by bladder scan, Foley catheter reinserted, seen by urologist, she recommended to discharge with Foley catheter and have follow-up with  urology for bladder training, postvoid residual, started on Flomax, Proscar this time.  #6 history of coronary artery disease, PCI, shortness of breath: Seen by Dr.Arida. He recommended echocardiogram, endocardium showed EF more than 55% with  Normal wall motion. Follow-up with  cardiology as an outpatient.    Discharge Condition: Stable  Follow UP  Follow-up Information    Yevonne Pax, MD. Go on 08/26/2016.   Specialty:  Internal Medicine Why:  at 10:00 a.m. Contact information: [address] [place] [address] [phone]        Iran Ouch, MD. Go on 08/31/2016.   Specialty:  Cardiology Why:  at 4:20 p.m. Contact information: [address] [place] [address] [phone]        Vanna Scotland, MD. Go on 08/23/2016.   Specialty:  Urology Why:  at 8:45 a.m. Contact information: [address] [place] [address] [phone]             Discharge Instructions  and  Discharge Medications      Allergies as of 08/19/2016   No Known Allergies     Medication List    STOP taking these medications   LEVAQUIN 500 MG tablet Generic drug:  levofloxacin   predniSONE 20 MG tablet Commonly known as:  DELTASONE Replaced by:  predniSONE 10 MG (21) Tbpk tablet     TAKE these medications   albuterol 108 (90 Base) MCG/ACT inhaler Commonly known as:  PROVENTIL HFA;VENTOLIN HFA Inhale 2 puffs into the lungs every 6 (six) hours as needed for wheezing or shortness of breath.   amoxicillin-clavulanate 875-125 MG tablet Commonly known as:  AUGMENTIN Take 1 tablet by mouth 2 (two) times daily.   aspirin EC 325 MG tablet Take 1 tablet (325 mg total) by mouth daily.   azithromycin 250 MG tablet Commonly known as:  ZITHROMAX 4 doses   budesonide 0.5 MG/2ML nebulizer solution Commonly known as:  PULMICORT Take 2 mLs (0.5 mg total) by nebulization 2 (two) times daily.   carvedilol 12.5 MG tablet Commonly known as:  COREG Take 1 tablet (12.5 mg total) by mouth daily at 2 am. What changed:  how much to take  when to take this   ESBRIET 267 MG Caps Generic drug:  Pirfenidone Take 3 capsules by mouth 3 (three) times daily.   finasteride 5 MG tablet Commonly known as:   PROSCAR Take 1 tablet (5 mg total) by mouth daily.   ipratropium-albuterol 0.5-2.5 (3) MG/3ML Soln Commonly known as:  DUONEB Take 3 mLs by nebulization every 6 (six) hours.   mirtazapine 15 MG tablet Commonly known as:  REMERON Take 1 tablet (15 mg total) by mouth at bedtime.   mometasone-formoterol 100-5 MCG/ACT Aero Commonly known as:  DULERA Inhale 2 puffs into the lungs 2 (two) times daily.   pantoprazole 40 MG tablet Commonly known as:  PROTONIX Take 1 tablet (40 mg total) by mouth daily as needed. Caution:prolonged use may increase risk of pneumonia, colitis, osteoporosis, anemia What changed:  when to take this  additional instructions   predniSONE 10 MG (21) Tbpk tablet Commonly known as:  STERAPRED UNI-PAK 21 TAB Taper by 10 mg daily Replaces:  predniSONE 20 MG tablet   rivastigmine 9.5 mg/24hr Commonly known as:  EXELON Place 1 patch (9.5 mg total) onto the skin daily.   rosuvastatin 40 MG tablet Commonly known as:  CRESTOR Take 1 tablet (40 mg total) by mouth daily.   tamsulosin 0.4  MG Caps capsule Commonly known as:  FLOMAX Take 1 capsule (0.4 mg total) by mouth daily.   zolpidem 5 MG tablet Commonly known as:  AMBIEN Take 5 mg by mouth at bedtime as needed.     ASK your doctor about these medications   lidocaine 2 % jelly Commonly known as:  XYLOCAINE Place 1 application into the urethra once. Ask about: Should I take this medication?         Diet and Activity recommendation: See Discharge Instructions above   Consults obtained -cardiology, urology   Major procedures and Radiology Reports - PLEASE review detailed and final reports for all details, in brief -      Dg Chest 2 View  Result Date: 08/10/2016 CLINICAL DATA:  Productive cough. History of COPD. Pulmonary fibrosis. EXAM: CHEST  2 VIEW COMPARISON:  CT 11/10/2015.  Chest x-ray 10/06/2015. FINDINGS: Mediastinum and hilar structures are stable. Stable mild cardiomegaly. No  pulmonary venous congestion. Stable bilateral chronic interstitial changes consistent with interstitial fibrosis. As noted on prior studies active interstitial lung disease cannot excluded. No evidence of progression of interstitial changes. No pleural effusion or pneumothorax. Thoracic spine scoliosis. Diffuse osteopenia and degenerative change thoracic spine. Aortic atherosclerotic vascular calcification. IMPRESSION: 1. Chronic interstitial lung disease, stable in appearance from prior exam. No acute pulmonary infiltrate noted. 2.  Stable mild cardiomegaly.  No pulmonary venous congestion. 3.  Aortic atherosclerotic vascular disease. Electronically Signed   By: Maisie Fus  Register   On: 08/10/2016 16:02   Ct Angio Chest Pe W Or Wo Contrast  Result Date: 08/15/2016 CLINICAL DATA:  83 year old male with history of pulmonary fibrosis on oxygen, with increasing shortness of breath and cough. EXAM: CT ANGIOGRAPHY CHEST WITH CONTRAST TECHNIQUE: Multidetector CT imaging of the chest was performed using the standard protocol during bolus administration of intravenous contrast. Multiplanar CT image reconstructions and MIPs were obtained to evaluate the vascular anatomy. CONTRAST:  75 mL of Isovue 370. COMPARISON:  Chest CT 11/10/2015. FINDINGS: Cardiovascular: No filling defects within the pulmonary arterial tree to suggest underlying pulmonary embolism. Heart size is normal. There is no significant pericardial fluid, thickening or pericardial calcification. Dilatation of the pulmonic trunk (4.1 cm in diameter), indicative of pulmonary arterial hypertension. There is aortic atherosclerosis, as well as atherosclerosis of the great vessels of the mediastinum and the coronary arteries, including calcified atherosclerotic plaque in the left main, left anterior descending and right coronary arteries. Mediastinum/Nodes: Numerous borderline enlarged and enlarged mediastinal and bilateral hilar lymph nodes measuring up to 15 mm  in short axis in the low right paratracheal nodal station and 14 mm in short axis in the right hilar region some of these lymph nodes are densely calcified. Esophagus is unremarkable in appearance. No axillary lymphadenopathy. Lungs/Pleura: Again noted are extensive areas of septal thickening, subpleural reticulation, parenchymal banding, traction bronchiectasis and honeycombing, compatible with interstitial lung disease (likely UIP). However, today's study demonstrates new consolidative changes in the right lower lobe, concerning for superimposed bronchopneumonia. No pleural effusions. Upper Abdomen: Capsular calcifications in the spleen, likely sequela of remote trauma. Aortic atherosclerosis. Musculoskeletal: There are no aggressive appearing lytic or blastic lesions noted in the visualized portions of the skeleton. Review of the MIP images confirms the above findings. IMPRESSION: 1. Right lower lobe bronchopneumonia. 2. 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 3. Dilatation of the pulmonic trunk, indicative of associated pulmonary arterial hypertension. 4. Aortic atherosclerosis, in addition  to left main and 2 vessel coronary artery disease. Electronically Signed   By: Trudie Reedaniel  Entrikin M.D.   On: 08/15/2016 16:19    Micro Results    Recent Results (from the past 240 hour(s))  Culture, blood (routine x 2)     Status: None   Collection Time: 08/15/16  3:02 PM  Result Value Ref Range Status   Specimen Description BLOOD LEFT AC  Final   Special Requests BOTTLES DRAWN AEROBIC AND ANAEROBIC BCHV  Final   Culture NO GROWTH 5 DAYS  Final   Report Status 08/20/2016 FINAL  Final  Culture, blood (routine x 2)     Status: None   Collection Time: 08/15/16  3:11 PM  Result Value Ref Range Status   Specimen Description BLOOD RIGHT AC  Final   Special Requests BOTTLES DRAWN AEROBIC AND ANAEROBIC BCHV  Final    Culture NO GROWTH 5 DAYS  Final   Report Status 08/20/2016 FINAL  Final       Today   Subjective:   Vincent Kennedy today has no Shortness of breath. Discharge home with family.  Objective:   Blood pressure (!) 141/64, pulse 60, temperature 97.9 F (36.6 C), temperature source Oral, resp. rate 20, height 5\' 8"  (1.727 m), weight 70.3 kg (154 lb 14.4 oz), SpO2 99 %.  No intake or output data in the 24 hours ending 08/20/16 1409  Exam Awake Alert, Oriented x 3, No new F.N deficits, Normal affect .AT,PERRAL Supple Neck,No JVD, No cervical lymphadenopathy appriciated.  Symmetrical Chest wall movement, Good air movement bilaterally, CTAB RRR,No Gallops,Rubs or new Murmurs, No Parasternal Heave +ve B.Sounds, Abd Soft, Non tender, No organomegaly appriciated, No rebound -guarding or rigidity. No Cyanosis, Clubbing or edema, No new Rash or bruise  Data Review   CBC w Diff:  Lab Results  Component Value Date   WBC 17.7 (H) 08/17/2016   HGB 12.5 (L) 08/17/2016   HCT 36.9 (L) 08/17/2016   HCT 44.6 08/26/2015   PLT 241 08/17/2016   PLT 191 08/26/2015   LYMPHOPCT 10 08/15/2016   MONOPCT 10 08/15/2016   EOSPCT 1 08/15/2016   BASOPCT 0 08/15/2016    CMP:  Lab Results  Component Value Date   NA 138 08/17/2016   NA 142 08/26/2015   K 4.0 08/17/2016   CL 107 08/17/2016   CO2 25 08/17/2016   BUN 19 08/17/2016   BUN 14 08/26/2015   CREATININE 0.71 08/17/2016   CREATININE 0.84 12/24/2015   PROT 8.7 (H) 08/15/2016   PROT 8.0 08/26/2015   ALBUMIN 3.6 08/15/2016   ALBUMIN 3.9 08/26/2015   BILITOT 0.7 08/15/2016   BILITOT 0.4 08/26/2015   ALKPHOS 61 08/15/2016   AST 38 08/15/2016   ALT 26 08/15/2016  .   Total Time in preparing paper work, data evaluation and todays exam - 35 minutes  Davida Falconi M.D on 08/19/2016 at 2:09 PM    Note: This dictation was prepared with Dragon dictation along with smaller phrase technology. Any transcriptional errors that result  from this process are unintentional.

## 2016-08-23 ENCOUNTER — Ambulatory Visit (INDEPENDENT_AMBULATORY_CARE_PROVIDER_SITE_OTHER): Payer: Medicare Other

## 2016-08-23 VITALS — BP 144/76 | HR 74 | Ht 68.0 in | Wt 152.0 lb

## 2016-08-23 DIAGNOSIS — R339 Retention of urine, unspecified: Secondary | ICD-10-CM | POA: Diagnosis not present

## 2016-08-23 LAB — BLADDER SCAN AMB NON-IMAGING: SCAN RESULT: 246

## 2016-08-23 NOTE — Progress Notes (Signed)
Fill and Pull Catheter Removal  Patient is present today for a catheter removal.  Patient was cleaned and prepped in a sterile fashion 260ml of sterile water/ saline was instilled into the bladder when the patient felt the urge to urinate. 20ml of water was then drained from the balloon.  A 16FR foley cath was removed from the bladder no complications were noted .  Patient as then given some time to void on their own.  Patient can void  120ml on their own after some time.  Patient tolerated well. Asked patient to come back at 3 pm for Bladder Scan to make sure continues to empty.   Performed by: C.Rana SnareLowe, CMA  Bladder Scan Patient can void: 246ml Performed By: C. Rana SnareLowe, CMA

## 2016-08-23 NOTE — Progress Notes (Signed)
Continuous Intermittent Catheterization  Due to incomplete bladder emptying after foley removal this morning per Dr. Apolinar JunesBrandon teach [facility] if patient is unable to void this evening. Patient was given detailed verbal and printed instructions of self catheterization. Patient was cleaned and prepped in a sterile fashion.  With instruction and assistance patient inserted a 14FR and urine return was noted 380 ml, urine was yellow in color. Patient tolerated well, no complications were noted Patient was given a sample bag with supplies to take home.  Instructions were given per Dr. Apolinar JunesBrandon for patient to cath as needed and to call in the morning to update on status. Patient is to follow up in 1-2wks w/MD  Preformed by: Eligha BridegroomSarah Francely Craw, CMA

## 2016-08-24 DIAGNOSIS — J84112 Idiopathic pulmonary fibrosis: Secondary | ICD-10-CM | POA: Diagnosis not present

## 2016-08-24 DIAGNOSIS — G4733 Obstructive sleep apnea (adult) (pediatric): Secondary | ICD-10-CM | POA: Diagnosis not present

## 2016-08-24 DIAGNOSIS — J181 Lobar pneumonia, unspecified organism: Secondary | ICD-10-CM | POA: Diagnosis not present

## 2016-08-25 ENCOUNTER — Ambulatory Visit (INDEPENDENT_AMBULATORY_CARE_PROVIDER_SITE_OTHER): Payer: Medicare Other | Admitting: Family Medicine

## 2016-08-25 VITALS — BP 97/58 | HR 84 | Ht 68.0 in | Wt 158.0 lb

## 2016-08-25 DIAGNOSIS — R339 Retention of urine, unspecified: Secondary | ICD-10-CM

## 2016-08-25 LAB — BLADDER SCAN AMB NON-IMAGING: SCAN RESULT: 138

## 2016-08-25 NOTE — Addendum Note (Signed)
Addended by: Honor LohGARRISON, CARRIE M on: 08/25/2016 12:08 PM   Modules accepted: Level of Service

## 2016-08-31 ENCOUNTER — Ambulatory Visit: Payer: Medicare Other | Admitting: Cardiovascular Disease

## 2016-09-03 ENCOUNTER — Ambulatory Visit: Payer: Medicare Other

## 2016-09-16 ENCOUNTER — Ambulatory Visit (INDEPENDENT_AMBULATORY_CARE_PROVIDER_SITE_OTHER): Payer: Medicare Other | Admitting: Cardiovascular Disease

## 2016-09-16 ENCOUNTER — Encounter: Payer: Self-pay | Admitting: Cardiovascular Disease

## 2016-09-16 VITALS — BP 108/60 | HR 80 | Ht 68.0 in | Wt 156.0 lb

## 2016-09-16 DIAGNOSIS — I10 Essential (primary) hypertension: Secondary | ICD-10-CM | POA: Diagnosis not present

## 2016-09-16 DIAGNOSIS — I2510 Atherosclerotic heart disease of native coronary artery without angina pectoris: Secondary | ICD-10-CM

## 2016-09-16 DIAGNOSIS — E7800 Pure hypercholesterolemia, unspecified: Secondary | ICD-10-CM | POA: Diagnosis not present

## 2016-09-16 MED ORDER — TAMSULOSIN HCL 0.4 MG PO CAPS: 0.4000 mg | ORAL_CAPSULE | Freq: Every day | ORAL | 1 refills | Status: DC

## 2016-09-16 MED ORDER — CARVEDILOL 12.5 MG PO TABS: 6.2500 mg | ORAL_TABLET | Freq: Two times a day (BID) | ORAL | 1 refills | Status: DC

## 2016-09-16 MED ORDER — FINASTERIDE 5 MG PO TABS: 5.0000 mg | ORAL_TABLET | Freq: Every day | ORAL | 1 refills | Status: DC

## 2016-09-16 NOTE — Progress Notes (Signed)
Cardiology Office Note   Date:  09/16/2016   ID:  Vincent Kennedy, DOB 1932-11-19, MRN [id number]  PCP:  Yevonne Pax, MD  Cardiologist:   [name], MD   Chief Complaint  Patient presents with  . other    F/u echo. Meds reviewed verbally with pt.      History of Present Illness: Vincent Kennedy is a 83 y.o. male who Is here today for follow-up visit regarding coronary artery disease. He is a retired Development worker, international aid . In 2008 when he presented with unstable angina. Cardiac catheterization showed 99% ostial first diagonal stenosis as well as mild left main and LAD disease. He underwent successful angioplasty and drug-eluting stent placement to the ostial first diagonal. He has not had any cardiac events since then. Most recent nuclear stress test in March 2016 was normal.  He was diagnosed with pulmonary fibrosis in 2016. He is on continuous oxygen therapy.  He was hospitalized recently at [facility] with symptoms of pneumonia. CT scan of the chest showed evidence of right sided pneumonia with coronary calcifications including left main and dilated pulmonary artery suggestive of pulmonary hypertension. He improved with antibiotics. He had urinary retention which required Foley catheter placement. He had an echocardiogram done while hospitalized which showed normal LV systolic function with an EF of 55-60%, grade 1 diastolic dysfunction and mild pulmonary hypertension with peak systolic pressure 42 mmHg.  He has been doing reasonably well and reports that she is almost 90% back to his baseline. No chest pain.  Past Medical History:  Diagnosis Date  . Abnormal nuclear cardiac imaging test    Dr. [name]  . Cataract   . Coronary artery disease    Cardiac cath September 2008: 20% left main stenosis, 40% mid LAD stenosis, 99% ostial first diagonal stenosis in a large branch and 90% ostial disease in second diagonal but the vessel was very small, 20% mid RCA stenosis with normal  ejection fraction. Successful angioplasty and drug-eluting stent placement to the ostial first diagonal with a 2.5 x 15 mm Xience stent  . Hyperlipidemia   . Hypertension    BP has never been high(per pt).  Metoprolol is to keep BP low because of stent (afterload)  . Oxygen deficiency   . Pulmonary fibrosis (HCC) 08/26/2015  . Shortness of breath dyspnea    with exertion  . Sleep apnea    has not used CPAP recently.  Feels condition has improved.  . Wears hearing aid    (sometimes)    Past Surgical History:  Procedure Laterality Date  . CARDIAC CATHETERIZATION     2010  . CATARACT EXTRACTION W/PHACO Right 01/29/2015   Procedure: CATARACT EXTRACTION PHACO AND INTRAOCULAR LENS PLACEMENT (IOC);  Surgeon: Lockie Mola, MD;  Location: [facility];  Service: Ophthalmology;  Laterality: Right;  RESTOR LENS CPAP  . CATARACT EXTRACTION W/PHACO Left 03/12/2015   Procedure: CATARACT EXTRACTION PHACO AND INTRAOCULAR LENS PLACEMENT (IOC);  Surgeon: Lockie Mola, MD;  Location: [facility];  Service: Ophthalmology;  Laterality: Left;  RESTOR SHUGARCAINE  . COLONOSCOPY WITH ESOPHAGOGASTRODUODENOSCOPY (EGD)    . CORONARY ANGIOPLASTY  2010  . EYE SURGERY     cataract  . HERNIA REPAIR Bilateral    inguinal     Current Outpatient Prescriptions  Medication Sig Dispense Refill  . albuterol (PROVENTIL HFA;VENTOLIN HFA) 108 (90 Base) MCG/ACT inhaler Inhale 2 puffs into the lungs every 6 (six) hours as needed for wheezing or shortness of breath.  1 Inhaler 2  . aspirin EC 325 MG tablet Take 1 tablet (325 mg total) by mouth daily. 30 tablet 0  . budesonide (PULMICORT) 0.5 MG/2ML nebulizer solution Take 2 mLs (0.5 mg total) by nebulization 2 (two) times daily. 2 mL 12  . finasteride (PROSCAR) 5 MG tablet Take 1 tablet (5 mg total) by mouth daily. 90 tablet 1  . ipratropium-albuterol (DUONEB) 0.5-2.5 (3) MG/3ML SOLN Take 3 mLs by nebulization every 6 (six) hours. 360 mL 2  .  mirtazapine (REMERON) 15 MG tablet Take 1 tablet (15 mg total) by mouth at bedtime. 30 tablet 11  . mometasone-formoterol (DULERA) 100-5 MCG/ACT AERO Inhale 2 puffs into the lungs 2 (two) times daily. 1 Inhaler 1  . pantoprazole (PROTONIX) 40 MG tablet Take 1 tablet (40 mg total) by mouth daily as needed. Caution:prolonged use may increase risk of pneumonia, colitis, osteoporosis, anemia (Patient taking differently: Take 40 mg by mouth daily. Caution:prolonged use may increase risk of pneumonia, colitis, osteoporosis, anemi) 30 tablet 1  . Pirfenidone (ESBRIET) 267 MG CAPS Take 3 capsules by mouth 3 (three) times daily.     . rivastigmine (EXELON) 9.5 mg/24hr Place 1 patch (9.5 mg total) onto the skin daily. 30 patch 2  . rosuvastatin (CRESTOR) 40 MG tablet Take 1 tablet (40 mg total) by mouth daily. 90 tablet 1  . tamsulosin (FLOMAX) 0.4 MG CAPS capsule Take 1 capsule (0.4 mg total) by mouth daily. 90 capsule 1  . zolpidem (AMBIEN) 5 MG tablet Take 5 mg by mouth at bedtime as needed.    . carvedilol (COREG) 12.5 MG tablet Take 0.5 tablets (6.25 mg total) by mouth 2 (two) times daily. 90 tablet 1   No current facility-administered medications for this visit.     Allergies:   Patient has no known allergies.    Social History:  The patient  reports that he has never smoked. He has never used smokeless tobacco. He reports that he does not drink alcohol or use drugs.   Family History:  The patient's family history includes CAD in his brother and father.    ROS:  Please see the history of present illness.   Otherwise, review of systems are positive for none.   All other systems are reviewed and negative.    PHYSICAL EXAM: VS:  BP 108/60 (BP Location: Left Arm, Patient Position: Sitting, Cuff Size: Normal)   Pulse 80   Ht 5\' 8"  (1.727 m)   Wt 156 lb (70.8 kg)   BMI 23.72 kg/m  , BMI Body mass index is 23.72 kg/m. GEN: Well nourished, well developed, in no acute distress  HEENT: normal    Neck: no JVD, carotid bruits, or masses Cardiac: RRR; no murmurs, rubs, or gallops,no edema  Respiratory:  Diminished breath sounds bilaterally with fine crackles, bronchial breath sounds at the right base, normal work of breathing GI: soft, nontender, nondistended, + BS MS: no deformity or atrophy  Skin: warm and dry, no rash Neuro:  Strength and sensation are intact Psych: euthymic mood, full affect   EKG:  EKG is ordered today. The ekg ordered today demonstrates sinus rhythm with sinus arrhythmia   Recent Labs: 08/15/2016: ALT 26; B Natriuretic Peptide 98.0 08/17/2016: BUN 19; Creatinine, Ser 0.71; Hemoglobin 12.5; Platelets 241; Potassium 4.0; Sodium 138    Lipid Panel    Component Value Date/Time   CHOL 149 12/24/2015 1105   CHOL 187 08/26/2015 0931   TRIG 105 12/24/2015 1105   HDL 27 (  L) 12/24/2015 1105   HDL 34 (L) 08/26/2015 0931   CHOLHDL 5.5 (H) 12/24/2015 1105   VLDL 21 12/24/2015 1105   LDLCALC 101 12/24/2015 1105   LDLCALC 131 (H) 08/26/2015 0931      Wt Readings from Last 3 Encounters:  09/16/16 156 lb (70.8 kg)  08/25/16 158 lb (71.7 kg)  08/23/16 152 lb (68.9 kg)     No flowsheet data found.    ASSESSMENT AND PLAN:  1.  Coronary artery disease involving native coronary arteries without angina: I discussed with him the recent findings of coronary calcifications on CT scan. He is known to have mild coronary calcifications based on his previous cardiac catheterization. Thus, the CT scan findings are not completely surprising but I cannot completely exclude  the possibility of CAD progression. He was also found to have mildly dilated pulmonary artery but recent echocardiogram showed only mild pulmonary hypertension. Left and right heart catheterization might clarify these issues and I did discuss this with him but he prefers to wait for now given the improvement in his symptoms.  2. Hyperlipidemia: Currently on rosuvastatin 40 mg once daily.  3.  Pulmonary fibrosis: Managed by [facility] and [name].   4. Essential hypertension: I switched carvedilol to 6.25 mg twice daily instead of 12.5 mg once daily.     Disposition:   FU with me in 6 months  Signed,  [name], MD  09/16/2016 4:33 PM    [facility]

## 2016-09-16 NOTE — Patient Instructions (Signed)
Medication Instructions:  Your physician has recommended you make the following change in your medication:  DECREASE coreg to 6.25mg  (1/2 tablet) twice daily    Labwork: none  Testing/Procedures: none  Follow-Up: Your physician wants you to follow-up in: 6 months with Dr. Kirke CorinArida.  You will receive a reminder letter in the mail two months in advance. If you don't receive a letter, please call our office to schedule the follow-up appointment.  Any Other Special Instructions Will Be Listed Below (If Applicable).     If you need a refill on your cardiac medications before your next appointment, please call your pharmacy.

## 2016-09-18 DIAGNOSIS — J9601 Acute respiratory failure with hypoxia: Secondary | ICD-10-CM | POA: Diagnosis not present

## 2016-09-18 DIAGNOSIS — G4733 Obstructive sleep apnea (adult) (pediatric): Secondary | ICD-10-CM | POA: Diagnosis not present

## 2016-09-20 ENCOUNTER — Ambulatory Visit: Payer: Medicare Other | Admitting: Urology

## 2016-09-20 ENCOUNTER — Encounter: Payer: Self-pay | Admitting: Urology

## 2016-10-19 DIAGNOSIS — J9601 Acute respiratory failure with hypoxia: Secondary | ICD-10-CM | POA: Diagnosis not present

## 2016-10-19 DIAGNOSIS — G4733 Obstructive sleep apnea (adult) (pediatric): Secondary | ICD-10-CM | POA: Diagnosis not present

## 2016-11-11 ENCOUNTER — Other Ambulatory Visit: Payer: Self-pay | Admitting: Cardiovascular Disease

## 2016-11-19 DIAGNOSIS — G4733 Obstructive sleep apnea (adult) (pediatric): Secondary | ICD-10-CM | POA: Diagnosis not present

## 2016-11-19 DIAGNOSIS — J9601 Acute respiratory failure with hypoxia: Secondary | ICD-10-CM | POA: Diagnosis not present

## 2016-11-23 DIAGNOSIS — J84112 Idiopathic pulmonary fibrosis: Secondary | ICD-10-CM | POA: Diagnosis not present

## 2016-11-23 DIAGNOSIS — J9611 Chronic respiratory failure with hypoxia: Secondary | ICD-10-CM | POA: Diagnosis not present

## 2016-11-24 ENCOUNTER — Other Ambulatory Visit: Payer: Self-pay | Admitting: Internal Medicine

## 2016-11-24 DIAGNOSIS — J849 Interstitial pulmonary disease, unspecified: Secondary | ICD-10-CM

## 2016-11-25 DIAGNOSIS — K219 Gastro-esophageal reflux disease without esophagitis: Secondary | ICD-10-CM | POA: Insufficient documentation

## 2016-11-25 DIAGNOSIS — G4733 Obstructive sleep apnea (adult) (pediatric): Secondary | ICD-10-CM | POA: Insufficient documentation

## 2016-11-25 DIAGNOSIS — Z9861 Coronary angioplasty status: Secondary | ICD-10-CM

## 2016-11-25 DIAGNOSIS — H269 Unspecified cataract: Secondary | ICD-10-CM | POA: Insufficient documentation

## 2016-11-25 DIAGNOSIS — Z9989 Dependence on other enabling machines and devices: Secondary | ICD-10-CM

## 2016-11-25 DIAGNOSIS — J9611 Chronic respiratory failure with hypoxia: Secondary | ICD-10-CM | POA: Insufficient documentation

## 2016-11-25 DIAGNOSIS — I2510 Atherosclerotic heart disease of native coronary artery without angina pectoris: Secondary | ICD-10-CM | POA: Insufficient documentation

## 2016-11-25 DIAGNOSIS — K4090 Unilateral inguinal hernia, without obstruction or gangrene, not specified as recurrent: Secondary | ICD-10-CM | POA: Insufficient documentation

## 2016-12-01 DIAGNOSIS — R0602 Shortness of breath: Secondary | ICD-10-CM | POA: Diagnosis not present

## 2016-12-02 ENCOUNTER — Ambulatory Visit
Admission: RE | Admit: 2016-12-02 | Discharge: 2016-12-02 | Disposition: A | Discharge status: Home / Self Care | Payer: Medicare Other | Source: Ambulatory Visit | Attending: Internal Medicine | Admitting: Internal Medicine

## 2016-12-02 DIAGNOSIS — I2510 Atherosclerotic heart disease of native coronary artery without angina pectoris: Secondary | ICD-10-CM | POA: Diagnosis not present

## 2016-12-02 DIAGNOSIS — J189 Pneumonia, unspecified organism: Secondary | ICD-10-CM | POA: Diagnosis not present

## 2016-12-02 DIAGNOSIS — I700 Atherosclerosis of aorta: Secondary | ICD-10-CM | POA: Diagnosis not present

## 2016-12-02 DIAGNOSIS — J849 Interstitial pulmonary disease, unspecified: Secondary | ICD-10-CM | POA: Diagnosis not present

## 2016-12-02 DIAGNOSIS — J479 Bronchiectasis, uncomplicated: Secondary | ICD-10-CM | POA: Diagnosis not present

## 2016-12-03 ENCOUNTER — Ambulatory Visit
Admission: RE | Admit: 2016-12-03 | Discharge: 2016-12-03 | Disposition: A | Discharge status: Home / Self Care | Payer: Medicare Other | Source: Ambulatory Visit | Attending: Internal Medicine | Admitting: Internal Medicine

## 2016-12-07 DIAGNOSIS — I2510 Atherosclerotic heart disease of native coronary artery without angina pectoris: Secondary | ICD-10-CM | POA: Diagnosis not present

## 2016-12-07 DIAGNOSIS — J849 Interstitial pulmonary disease, unspecified: Secondary | ICD-10-CM | POA: Diagnosis not present

## 2016-12-07 DIAGNOSIS — K219 Gastro-esophageal reflux disease without esophagitis: Secondary | ICD-10-CM | POA: Diagnosis not present

## 2016-12-07 DIAGNOSIS — R0609 Other forms of dyspnea: Secondary | ICD-10-CM | POA: Diagnosis not present

## 2016-12-07 DIAGNOSIS — J84112 Idiopathic pulmonary fibrosis: Secondary | ICD-10-CM | POA: Diagnosis not present

## 2016-12-19 DIAGNOSIS — J9601 Acute respiratory failure with hypoxia: Secondary | ICD-10-CM | POA: Diagnosis not present

## 2016-12-20 ENCOUNTER — Encounter: Payer: Medicare Other | Attending: Internal Medicine

## 2016-12-20 VITALS — Ht 66.5 in | Wt 156.4 lb

## 2016-12-20 DIAGNOSIS — E785 Hyperlipidemia, unspecified: Secondary | ICD-10-CM | POA: Diagnosis not present

## 2016-12-20 DIAGNOSIS — J8410 Pulmonary fibrosis, unspecified: Secondary | ICD-10-CM | POA: Diagnosis not present

## 2016-12-20 DIAGNOSIS — I10 Essential (primary) hypertension: Secondary | ICD-10-CM | POA: Diagnosis not present

## 2016-12-20 DIAGNOSIS — Z7982 Long term (current) use of aspirin: Secondary | ICD-10-CM | POA: Insufficient documentation

## 2016-12-20 DIAGNOSIS — Z7951 Long term (current) use of inhaled steroids: Secondary | ICD-10-CM | POA: Diagnosis not present

## 2016-12-20 DIAGNOSIS — Z955 Presence of coronary angioplasty implant and graft: Secondary | ICD-10-CM | POA: Insufficient documentation

## 2016-12-20 DIAGNOSIS — I2510 Atherosclerotic heart disease of native coronary artery without angina pectoris: Secondary | ICD-10-CM | POA: Diagnosis not present

## 2016-12-20 DIAGNOSIS — G4730 Sleep apnea, unspecified: Secondary | ICD-10-CM | POA: Diagnosis not present

## 2016-12-20 DIAGNOSIS — Z79899 Other long term (current) drug therapy: Secondary | ICD-10-CM | POA: Insufficient documentation

## 2016-12-20 NOTE — Patient Instructions (Signed)
Patient Instructions  Patient Details  Name: Vincent Kennedy MRN: [id number] Date of Birth: 12/10/1932 Referring Provider:  Yevonne Pax, MD  Below are the personal goals you chose as well as exercise and nutrition goals. Our goal is to help you keep on track towards obtaining and maintaining your goals. We will be discussing your progress on these goals with you throughout the program.  Initial Exercise Prescription:     Initial Exercise Prescription - 12/20/16 1600      Date of Initial Exercise RX and Referring Provider   Date 12/20/16   Referring Provider Freda Munro MD     Treadmill   MPH 1.5   Grade 0.5   Minutes 15   METs 2.25     NuStep   Level 1   SPM 80   Minutes 15   METs 2     REL-XR   Level 1   Speed 50   Minutes 15   METs 2     Prescription Details   Frequency (times per week) 3   Duration Progress to 45 minutes of aerobic exercise without signs/symptoms of physical distress     Intensity   THRR 40-80% of Max Heartrate 99-125   Ratings of Perceived Exertion 11-15   Perceived Dyspnea 0-4     Progression   Progression Continue to progress workloads to maintain intensity without signs/symptoms of physical distress.     Resistance Training   Training Prescription Yes   Weight 3 lbs   Reps 10-15      Exercise Goals: Frequency: Be able to perform aerobic exercise three times per week working toward 3-5 days per week.  Intensity: Work with a perceived exertion of 11 (fairly light) - 15 (hard) as tolerated. Follow your new exercise prescription and watch for changes in prescription as you progress with the program. Changes will be reviewed with you when they are made.  Duration: You should be able to do 30 minutes of continuous aerobic exercise in addition to a 5 minute warm-up and a 5 minute cool-down routine.  Nutrition Goals: Your personal nutrition goals will be established when you do your nutrition analysis with the dietician.  The  following are nutrition guidelines to follow: Cholesterol < /day Sodium < /day Fiber: Men over 50 yrs - 30 grams per day  Personal Goals:     Personal Goals and Risk Factors at Admission - 12/20/16 1550      Core Components/Risk Factors/Patient Goals on Admission    Weight Management Yes;Weight Maintenance   Intervention Weight Management: Develop a combined nutrition and exercise program designed to reach desired caloric intake, while maintaining appropriate intake of nutrient and fiber, sodium and fats, and appropriate energy expenditure required for the weight goal.;Weight Management: Provide education and appropriate resources to help participant work on and attain dietary goals.;Weight Management/Obesity: Establish reasonable short term and long term weight goals.   Admit Weight 156 lb (70.8 kg)   Goal Weight: Short Term 151 lb (68.5 kg)   Goal Weight: Long Term 151 lb (68.5 kg)   Expected Outcomes Short Term: Continue to assess and modify interventions until short term weight is achieved;Long Term: Adherence to nutrition and physical activity/exercise program aimed toward attainment of established weight goal;Weight Maintenance: Understanding of the daily nutrition guidelines, which includes 25-35% calories from fat, 7% or less cal from saturated fats, less than  cholesterol, less than 1.5gm of sodium, & 5 or more servings of fruits and vegetables daily;Understanding recommendations for meals  to include 15-35% energy as protein, 25-35% energy from fat, 35-60% energy from carbohydrates, less than  of dietary cholesterol, 20-35 gm of total fiber daily;Understanding of distribution of calorie intake throughout the day with the consumption of 4-5 meals/snacks   Lipids Yes   Intervention Provide education and support for participant on nutrition & aerobic/resistive exercise along with prescribed medications to achieve LDL 70mg , HDL >40mg .   Expected Outcomes Short Term:  Participant states understanding of desired cholesterol values and is compliant with medications prescribed. Participant is following exercise prescription and nutrition guidelines.;Long Term: Cholesterol controlled with medications as prescribed, with individualized exercise RX and with personalized nutrition plan. Value goals: LDL < , HDL > 40 mg.   Stress Yes   Intervention Offer individual and/or small group education and counseling on adjustment to heart disease, stress management and health-related lifestyle change. Teach and support self-help strategies.;Refer participants experiencing significant psychosocial distress to appropriate mental health specialists for further evaluation and treatment. When possible, include family members and significant others in education/counseling sessions.   Expected Outcomes Short Term: Participant demonstrates changes in health-related behavior, relaxation and other stress management skills, ability to obtain effective social support, and compliance with psychotropic medications if prescribed.;Long Term: Emotional wellbeing is indicated by absence of clinically significant psychosocial distress or social isolation.      Tobacco Use Initial Evaluation: History  Smoking Status  . Never Smoker  Smokeless Tobacco  . Never Used    Exercise Goals and Review:     Exercise Goals    Row Name 12/20/16 1654             Exercise Goals   Increase Physical Activity Yes       Intervention Provide advice, education, support and counseling about physical activity/exercise needs.;Develop an individualized exercise prescription for aerobic and resistive training based on initial evaluation findings, risk stratification, comorbidities and participant's personal goals.       Expected Outcomes Achievement of increased cardiorespiratory fitness and enhanced flexibility, muscular endurance and strength shown through measurements of functional capacity and personal  statement of participant.       Increase Strength and Stamina Yes       Intervention Provide advice, education, support and counseling about physical activity/exercise needs.;Develop an individualized exercise prescription for aerobic and resistive training based on initial evaluation findings, risk stratification, comorbidities and participant's personal goals.       Expected Outcomes Achievement of increased cardiorespiratory fitness and enhanced flexibility, muscular endurance and strength shown through measurements of functional capacity and personal statement of participant.       Able to understand and use rate of perceived exertion (RPE) scale Yes       Intervention Provide education and explanation on how to use RPE scale       Expected Outcomes Short Term: Able to use RPE daily in rehab to express subjective intensity level;Long Term:  Able to use RPE to guide intensity level when exercising independently       Able to understand and use Dyspnea scale Yes       Intervention Provide education and explanation on how to use Dyspnea scale       Expected Outcomes Short Term: Able to use Dyspnea scale daily in rehab to express subjective sense of shortness of breath during exertion;Long Term: Able to use Dyspnea scale to guide intensity level when exercising independently       Knowledge and understanding of Target Heart Rate Range (THRR) Yes  Intervention Provide education and explanation of THRR including how the numbers were predicted and where they are located for reference       Expected Outcomes Short Term: Able to state/look up THRR;Long Term: Able to use THRR to govern intensity when exercising independently;Short Term: Able to use daily as guideline for intensity in rehab       Able to check pulse independently Yes       Intervention Provide education and demonstration on how to check pulse in carotid and radial arteries.;Review the importance of being able to check your own pulse for  safety during independent exercise       Expected Outcomes Long Term: Able to check pulse independently and accurately;Short Term: Able to explain why pulse checking is important during independent exercise       Understanding of Exercise Prescription Yes       Intervention Provide education, explanation, and written materials on patient's individual exercise prescription       Expected Outcomes Short Term: Able to explain program exercise prescription;Long Term: Able to explain home exercise prescription to exercise independently          Copy of goals given to participant.

## 2016-12-20 NOTE — Progress Notes (Signed)
Pulmonary Individual Treatment Plan  Patient Details  Name: Vincent Kennedy MRN: [id number] Date of Birth: Feb 08, 1933 Referring Provider:     Pulmonary Rehab from 12/20/2016 in [facility] Cardiac and Pulmonary Rehab  Referring Provider  Freda Munro MD      Initial Encounter Date:    Pulmonary Rehab from 12/20/2016 in [facility] Cardiac and Pulmonary Rehab  Date  12/20/16  Referring Provider  Freda Munro MD      Visit Diagnosis: Pulmonary fibrosis (HCC)  Patient's Home Medications on Admission:  Current Outpatient Prescriptions:  .  albuterol (PROVENTIL HFA;VENTOLIN HFA) 108 (90 Base) MCG/ACT inhaler, Inhale 2 puffs into the lungs every 6 (six) hours as needed for wheezing or shortness of breath., Disp: 1 Inhaler, Rfl: 2 .  aspirin EC 325 MG tablet, Take 1 tablet (325 mg total) by mouth daily., Disp: 30 tablet, Rfl: 0 .  budesonide (PULMICORT) 0.5 MG/2ML nebulizer solution, Take 2 mLs (0.5 mg total) by nebulization 2 (two) times daily., Disp: 2 mL, Rfl: 12 .  carvedilol (COREG) 12.5 MG tablet, Take 0.5 tablets (6.25 mg total) by mouth 2 (two) times daily., Disp: 90 tablet, Rfl: 1 .  finasteride (PROSCAR) 5 MG tablet, Take 1 tablet (5 mg total) by mouth daily., Disp: 90 tablet, Rfl: 1 .  ipratropium-albuterol (DUONEB) 0.5-2.5 (3) MG/3ML SOLN, Take 3 mLs by nebulization every 6 (six) hours., Disp: 360 mL, Rfl: 2 .  mirtazapine (REMERON) 15 MG tablet, Take 1 tablet (15 mg total) by mouth at bedtime., Disp: 30 tablet, Rfl: 11 .  mometasone-formoterol (DULERA) 100-5 MCG/ACT AERO, Inhale 2 puffs into the lungs 2 (two) times daily., Disp: 1 Inhaler, Rfl: 1 .  pantoprazole (PROTONIX) 40 MG tablet, Take 1 tablet (40 mg total) by mouth daily as needed. Caution:prolonged use may increase risk of pneumonia, colitis, osteoporosis, anemia (Patient taking differently: Take 40 mg by mouth daily. Caution:prolonged use may increase risk of pneumonia, colitis, osteoporosis, anemi), Disp: 30 tablet, Rfl: 1 .   Pirfenidone (ESBRIET) 267 MG CAPS, Take 3 capsules by mouth 3 (three) times daily. , Disp: , Rfl:  .  rivastigmine (EXELON) 9.5 mg/24hr, Place 1 patch (9.5 mg total) onto the skin daily., Disp: 30 patch, Rfl: 2 .  rosuvastatin (CRESTOR) 40 MG tablet, TAKE ONE TABLET EVERY DAY, Disp: 90 tablet, Rfl: 2 .  tamsulosin (FLOMAX) 0.4 MG CAPS capsule, Take 1 capsule (0.4 mg total) by mouth daily., Disp: 90 capsule, Rfl: 1 .  zolpidem (AMBIEN) 5 MG tablet, Take 5 mg by mouth at bedtime as needed., Disp: , Rfl:   Past Medical History: Past Medical History:  Diagnosis Date  . Abnormal nuclear cardiac imaging test    Dr. [name]  . Cataract   . Coronary artery disease    Cardiac cath September 2008: 20% left main stenosis, 40% mid LAD stenosis, 99% ostial first diagonal stenosis in a large branch and 90% ostial disease in second diagonal but the vessel was very small, 20% mid RCA stenosis with normal ejection fraction. Successful angioplasty and drug-eluting stent placement to the ostial first diagonal with a 2.5 x 15 mm Xience stent  . Hyperlipidemia   . Hypertension    BP has never been high(per pt).  Metoprolol is to keep BP low because of stent (afterload)  . Oxygen deficiency   . Pulmonary fibrosis (HCC) 08/26/2015  . Shortness of breath dyspnea    with exertion  . Sleep apnea    has not used CPAP recently.  Feels condition has improved.  [name]  Wears hearing aid    (sometimes)    Tobacco Use: History  Smoking Status  . Never Smoker  Smokeless Tobacco  . Never Used    Labs: Recent Review Flowsheet Data    Labs for ITP Cardiac and Pulmonary Rehab Latest Ref Rng & Units 05/12/2012 07/23/2013 03/20/2015 08/26/2015 12/24/2015   Cholestrol 125 - 200 mg/dL 811 914 - 782 956   LDLCALC <130 mg/dL 84 72 - 213(Y) 865   HDL >=40 mg/dL 78(I) 69(G) - 29(B) 28(U)   Trlycerides <150 mg/dL 62 55 - 132 440   PHART 7.350 - 7.450 - - 7.44 - -   PCO2ART 32.0 - 48.0 mmHg - - 32 - -   HCO3 21.0 - 28.0 mEq/L - - 21.7  - -   ACIDBASEDEF 0.0 - 2.0 mmol/L - - 1.6 - -   O2SAT % - - 93.7 - -       Pulmonary Assessment Scores:     Pulmonary Assessment Scores    Row Name 12/20/16 1538         ADL UCSD   ADL Phase Entry     SOB Score total 48     Rest 1     Walk 2     Stairs 3     Bath 1     Dress 0     Shop 2       CAT Score   CAT Score 22        Pulmonary Function Assessment:   Exercise Target Goals: Date: 12/20/16  Exercise Program Goal: Individual exercise prescription set with THRR, safety & activity barriers. Participant demonstrates ability to understand and report RPE using BORG scale, to self-measure pulse accurately, and to acknowledge the importance of the exercise prescription.  Exercise Prescription Goal: Starting with aerobic activity 30 plus minutes a day, 3 days per week for initial exercise prescription. Provide home exercise prescription and guidelines that participant acknowledges understanding prior to discharge.  Activity Barriers & Risk Stratification:     Activity Barriers & Cardiac Risk Stratification - 12/20/16 1647      Activity Barriers & Cardiac Risk Stratification   Activity Barriers Deconditioning;Muscular Weakness;Shortness of Breath;Balance Concerns;History of Falls;Assistive Device      6 Minute Walk:     6 Minute Walk    Row Name 12/20/16 1634         6 Minute Walk   Phase Initial     Distance 760 feet     Walk Time 4.4 minutes  stopped test due to desaturations     # of Rest Breaks 1  at end of test     MPH 1.96     METS 1.93     RPE 14     Perceived Dyspnea  3     VO2 Peak 6.76     Symptoms Yes (comment)     Comments SOB     Resting HR 73 bpm     Resting BP 126/64     Resting Oxygen Saturation  94 %     Exercise Oxygen Saturation  during 6 min walk 79 %     Max Ex. HR 119 bpm     Max Ex. BP 154/74     2 Minute Post BP 156/64  recheck 146/72       Interval HR   1 Minute HR 73     2 Minute HR 116     3 Minute HR 104  4 Minute HR 116     5 Minute HR 119  stopped at 4:24 due desaturation to 79%     2 Minute Post HR 80     Interval Heart Rate? Yes       Interval Oxygen   Interval Oxygen? Yes     Baseline Oxygen Saturation % 94 %     1 Minute Oxygen Saturation % 92 %     1 Minute Liters of Oxygen 4 L  pulsed     2 Minute Oxygen Saturation % 83 %     2 Minute Liters of Oxygen 4 L     3 Minute Oxygen Saturation % 81 %     3 Minute Liters of Oxygen 4 L     4 Minute Oxygen Saturation % 80 %  79% at 4:24     4 Minute Liters of Oxygen 4 L     2 Minute Post Oxygen Saturation % 92 %     2 Minute Post Liters of Oxygen 4 L       Oxygen Initial Assessment:     Oxygen Initial Assessment - 12/20/16 1540      Home Oxygen   Home Oxygen Device Portable Concentrator;Home Concentrator;E-Tanks   Sleep Oxygen Prescription CPAP   Liters per minute 2.5   Home Exercise Oxygen Prescription Continuous   Liters per minute 4   Home at Rest Exercise Oxygen Prescription Continuous   Liters per minute 2.5   Compliance with Home Oxygen Use Yes     Initial 6 min Walk   Oxygen Used Continuous   Liters per minute 4     Program Oxygen Prescription   Program Oxygen Prescription Continuous   Liters per minute 4     Intervention   Short Term Goals To learn and exhibit compliance with exercise, home and travel O2 prescription;To learn and understand importance of maintaining oxygen saturations>88%;To learn and demonstrate proper use of respiratory medications;To learn and demonstrate proper pursed lip breathing techniques or other breathing techniques.;To learn and understand importance of monitoring SPO2 with pulse oximeter and demonstrate accurate use of the pulse oximeter.   Long  Term Goals Verbalizes importance of monitoring SPO2 with pulse oximeter and return demonstration;Exhibits proper breathing techniques, such as pursed lip breathing or other method taught during program session;Demonstrates proper use of  MDI's;Compliance with respiratory medication;Maintenance of O2 saturations>88%;Exhibits compliance with exercise, home and travel O2 prescription      Oxygen Re-Evaluation:   Oxygen Discharge (Final Oxygen Re-Evaluation):   Initial Exercise Prescription:     Initial Exercise Prescription - 12/20/16 1600      Date of Initial Exercise RX and Referring Provider   Date 12/20/16   Referring Provider Freda Munro MD     Treadmill   MPH 1.5   Grade 0.5   Minutes 15   METs 2.25     NuStep   Level 1   SPM 80   Minutes 15   METs 2     REL-XR   Level 1   Speed 50   Minutes 15   METs 2     Prescription Details   Frequency (times per week) 3   Duration Progress to 45 minutes of aerobic exercise without signs/symptoms of physical distress     Intensity   THRR 40-80% of Max Heartrate 99-125   Ratings of Perceived Exertion 11-15   Perceived Dyspnea 0-4     Progression   Progression Continue to progress workloads to maintain  intensity without signs/symptoms of physical distress.     Resistance Training   Training Prescription Yes   Weight 3 lbs   Reps 10-15      Perform Capillary Blood Glucose checks as needed.  Exercise Prescription Changes:     Exercise Prescription Changes    Row Name 12/20/16 1600             Response to Exercise   Blood Pressure (Admit) 126/64       Blood Pressure (Exercise) 154/74       Blood Pressure (Exit) 156/64  recheck 146/72       Heart Rate (Admit) 73 bpm       Heart Rate (Exercise) 120 bpm       Heart Rate (Exit) 80 bpm       Oxygen Saturation (Admit) 94 %       Oxygen Saturation (Exercise) 79 %       Oxygen Saturation (Exit) 92 %       Rating of Perceived Exertion (Exercise) 14       Perceived Dyspnea (Exercise) 3       Symptoms SOB       Comments walk test results          Exercise Comments:   Exercise Goals and Review:     Exercise Goals    Row Name 12/20/16 1654             Exercise Goals   Increase  Physical Activity Yes       Intervention Provide advice, education, support and counseling about physical activity/exercise needs.;Develop an individualized exercise prescription for aerobic and resistive training based on initial evaluation findings, risk stratification, comorbidities and participant's personal goals.       Expected Outcomes Achievement of increased cardiorespiratory fitness and enhanced flexibility, muscular endurance and strength shown through measurements of functional capacity and personal statement of participant.       Increase Strength and Stamina Yes       Intervention Provide advice, education, support and counseling about physical activity/exercise needs.;Develop an individualized exercise prescription for aerobic and resistive training based on initial evaluation findings, risk stratification, comorbidities and participant's personal goals.       Expected Outcomes Achievement of increased cardiorespiratory fitness and enhanced flexibility, muscular endurance and strength shown through measurements of functional capacity and personal statement of participant.       Able to understand and use rate of perceived exertion (RPE) scale Yes       Intervention Provide education and explanation on how to use RPE scale       Expected Outcomes Short Term: Able to use RPE daily in rehab to express subjective intensity level;Long Term:  Able to use RPE to guide intensity level when exercising independently       Able to understand and use Dyspnea scale Yes       Intervention Provide education and explanation on how to use Dyspnea scale       Expected Outcomes Short Term: Able to use Dyspnea scale daily in rehab to express subjective sense of shortness of breath during exertion;Long Term: Able to use Dyspnea scale to guide intensity level when exercising independently       Knowledge and understanding of Target Heart Rate Range (THRR) Yes       Intervention Provide education and  explanation of THRR including how the numbers were predicted and where they are located for reference       Expected Outcomes Short  Term: Able to state/look up THRR;Long Term: Able to use THRR to govern intensity when exercising independently;Short Term: Able to use daily as guideline for intensity in rehab       Able to check pulse independently Yes       Intervention Provide education and demonstration on how to check pulse in carotid and radial arteries.;Review the importance of being able to check your own pulse for safety during independent exercise       Expected Outcomes Long Term: Able to check pulse independently and accurately;Short Term: Able to explain why pulse checking is important during independent exercise       Understanding of Exercise Prescription Yes       Intervention Provide education, explanation, and written materials on patient's individual exercise prescription       Expected Outcomes Short Term: Able to explain program exercise prescription;Long Term: Able to explain home exercise prescription to exercise independently          Exercise Goals Re-Evaluation :   Discharge Exercise Prescription (Final Exercise Prescription Changes):     Exercise Prescription Changes - 12/20/16 1600      Response to Exercise   Blood Pressure (Admit) 126/64   Blood Pressure (Exercise) 154/74   Blood Pressure (Exit) 156/64  recheck 146/72   Heart Rate (Admit) 73 bpm   Heart Rate (Exercise) 120 bpm   Heart Rate (Exit) 80 bpm   Oxygen Saturation (Admit) 94 %   Oxygen Saturation (Exercise) 79 %   Oxygen Saturation (Exit) 92 %   Rating of Perceived Exertion (Exercise) 14   Perceived Dyspnea (Exercise) 3   Symptoms SOB   Comments walk test results      Nutrition:  Target Goals: Understanding of nutrition guidelines, daily intake of sodium 1500mg , cholesterol 200mg , calories 30% from fat and 7% or less from saturated fats, daily to have 5 or more servings of fruits and  vegetables.  Biometrics:     Pre Biometrics - 12/20/16 1657      Pre Biometrics   Height 5' 6.5" (1.689 m)   Weight 156 lb 6.4 oz (70.9 kg)   Waist Circumference 36.5 inches   Hip Circumference 37 inches   Waist to Hip Ratio 0.99 %   BMI (Calculated) 24.87       Nutrition Therapy Plan and Nutrition Goals:     Nutrition Therapy & Goals - 12/20/16 1533      Nutrition Therapy   RD appointment defered Yes     Intervention Plan   Intervention Prescribe, educate and counsel regarding individualized specific dietary modifications aiming towards targeted core components such as weight, hypertension, lipid management, diabetes, heart failure and other comorbidities.;Nutrition handout(s) given to patient.   Expected Outcomes Short Term Goal: Understand basic principles of dietary content, such as calories, fat, sodium, cholesterol and nutrients.;Short Term Goal: A plan has been developed with personal nutrition goals set during dietitian appointment.;Long Term Goal: Adherence to prescribed nutrition plan.      Nutrition Discharge: Rate Your Plate Scores:     Nutrition Assessments - 12/20/16 1534      MEDFICTS Scores   Pre Score 18      Nutrition Goals Re-Evaluation:   Nutrition Goals Discharge (Final Nutrition Goals Re-Evaluation):   Psychosocial: Target Goals: Acknowledge presence or absence of significant depression and/or stress, maximize coping skills, provide positive support system. Participant is able to verbalize types and ability to use techniques and skills needed for reducing stress and depression.   Initial Review &  Psychosocial Screening:     Initial Psych Review & Screening - 12/20/16 1530      Initial Review   Current issues with Current Depression;Current Stress Concerns   Source of Stress Concerns Chronic Illness;Family   Comments Feels like he cant take of the family as he once did.     Family Dynamics   Good Support System? Yes     Barriers    Psychosocial barriers to participate in program The patient should benefit from training in stress management and relaxation.     Screening Interventions   Interventions Program counselor consult;Encouraged to exercise;Yes;Provide feedback about the scores to participant   Expected Outcomes Short Term goal: Utilizing psychosocial counselor, staff and physician to assist with identification of specific Stressors or current issues interfering with healing process. Setting desired goal for each stressor or current issue identified.;Long Term Goal: Stressors or current issues are controlled or eliminated.;Short Term goal: Identification and review with participant of any Quality of Life or Depression concerns found by scoring the questionnaire.;Long Term goal: The participant improves quality of Life and PHQ9 Scores as seen by post scores and/or verbalization of changes      Quality of Life Scores:   PHQ-9: Recent Review Flowsheet Data    Depression screen [facility] 2/9 12/20/2016 04/26/2016 12/24/2015 08/26/2015 04/22/2015   Decreased Interest 2 0 0 0 0   Down, Depressed, Hopeless 1 0 0 0 0   PHQ - 2 Score 3 0 0 0 0   Altered sleeping 0 - - - 0   Tired, decreased energy 1 - - - -   Change in appetite 1 - - - 0   Feeling bad or failure about yourself  1 - - - 0   Trouble concentrating 0 - - - 0   Moving slowly or fidgety/restless 0 - - - 3   Suicidal thoughts 0 - - - 0   PHQ-9 Score 6 - - - 3   Difficult doing work/chores Somewhat difficult - - - Somewhat difficult     Interpretation of Total Score  Total Score Depression Severity:  1-4 = Minimal depression, 5-9 = Mild depression, 10-14 = Moderate depression, 15-19 = Moderately severe depression, 20-27 = Severe depression   Psychosocial Evaluation and Intervention:   Psychosocial Re-Evaluation:   Psychosocial Discharge (Final Psychosocial Re-Evaluation):   Education: Education Goals: Education classes will be provided on a weekly basis,  covering required topics. Participant will state understanding/return demonstration of topics presented.  Learning Barriers/Preferences:     Learning Barriers/Preferences - 12/20/16 1554      Learning Barriers/Preferences   Learning Barriers None   Learning Preferences None      Education Topics: Initial Evaluation Education: - Verbal, written and demonstration of respiratory meds, RPE/PD scales, oximetry and breathing techniques. Instruction on use of nebulizers and MDIs: cleaning and proper use, rinsing mouth with steroid doses and importance of monitoring MDI activations.   Pulmonary Rehab from 12/20/2016 in [facility] Cardiac and Pulmonary Rehab  Date  12/20/16  Educator  [facility]  Instruction Review Code  1- Verbalizes Understanding      General Nutrition Guidelines/Fats and Fiber: -Group instruction provided by verbal, written material, models and posters to present the general guidelines for heart healthy nutrition. Gives an explanation and review of dietary fats and fiber.   Pulmonary Rehab from 12/20/2016 in [facility] Cardiac and Pulmonary Rehab  Date  12/20/16  Educator  [facility]  Instruction Review Code  1- Verbalizes Understanding  Controlling Sodium/Reading Food Labels: -Group verbal and written material supporting the discussion of sodium use in heart healthy nutrition. Review and explanation with models, verbal and written materials for utilization of the food label.   Pulmonary Rehab from 07/28/2015 in [facility] Cardiac and Pulmonary Rehab  Date  05/12/15  Educator  CR  Instruction Review Code (retired)  2- meets goals/outcomes      Exercise Physiology & Risk Factors: - Group verbal and written instruction with models to review the exercise physiology of the cardiovascular system and associated critical values. Details cardiovascular disease risk factors and the goals associated with each risk factor.   Aerobic Exercise & Resistance Training: - Gives group verbal and written  discussion on the health impact of inactivity. On the components of aerobic and resistive training programs and the benefits of this training and how to safely progress through these programs.   Flexibility, Balance, General Exercise Guidelines: - Provides group verbal and written instruction on the benefits of flexibility and balance training programs. Provides general exercise guidelines with specific guidelines to those with heart or lung disease. Demonstration and skill practice provided.   Pulmonary Rehab from 07/28/2015 in [facility] Cardiac and Pulmonary Rehab  Date  05/07/15  Educator  RM  Instruction Review Code (retired)  2- meets goals/outcomes      Stress Management: - Provides group verbal and written instruction about the health risks of elevated stress, cause of high stress, and healthy ways to reduce stress.   Pulmonary Rehab from 07/28/2015 in [facility] Cardiac and Pulmonary Rehab  Date  05/14/15  Educator  [facility]  Instruction Review Code (retired)  2- meets goals/outcomes      Depression: - Provides group verbal and written instruction on the correlation between heart/lung disease and depressed mood, treatment options, and the stigmas associated with seeking treatment.   Exercise & Equipment Safety: - Individual verbal instruction and demonstration of equipment use and safety with use of the equipment.   Pulmonary Rehab from 12/20/2016 in [facility] Cardiac and Pulmonary Rehab  Date  12/20/16  Educator  [facility]  Instruction Review Code  1- Verbalizes Understanding      Infection Prevention: - Provides verbal and written material to individual with discussion of infection control including proper hand washing and proper equipment cleaning during exercise session.   Pulmonary Rehab from 12/20/2016 in [facility] Cardiac and Pulmonary Rehab  Date  12/20/16  Educator  [facility]  Instruction Review Code  1- Verbalizes Understanding      Falls Prevention: - Provides verbal and written material to  individual with discussion of falls prevention and safety.   Pulmonary Rehab from 12/20/2016 in [facility] Cardiac and Pulmonary Rehab  Date  12/20/16  Educator  [facility]  Instruction Review Code  1- Verbalizes Understanding      Diabetes: - Individual verbal and written instruction to review signs/symptoms of diabetes, desired ranges of glucose level fasting, after meals and with exercise. Advice that pre and post exercise glucose checks will be done for 3 sessions at entry of program.   Chronic Lung Diseases: - Group verbal and written instruction to review new updates, new respiratory medications, new advancements in procedures and treatments. Provide informative websites and "800" numbers of self-education.   Pulmonary Rehab from 07/28/2015 in [facility] Cardiac and Pulmonary Rehab  Date  07/28/15  Educator  [facility]  Instruction Review Code (retired)  2- meets goals/outcomes      Lung Procedures: - Group verbal and written instruction to describe testing methods done to diagnose  lung disease. Review the outcome of test results. Describe the treatment choices: Pulmonary Function Tests, ABGs and oximetry.   Pulmonary Rehab from 07/28/2015 in [facility] Cardiac and Pulmonary Rehab  Date  05/02/15  Educator  [facility]  Instruction Review Code (retired)  2- meets Engineer, maintenance: - Provide group verbal and written instruction for methods to conserve energy, plan and organize activities. Instruct on pacing techniques, use of adaptive equipment and posture/positioning to relieve shortness of breath.   Triggers: - Group verbal and written instruction to review types of environmental controls: home humidity, furnaces, filters, dust mite/pet prevention, HEPA vacuums. To discuss weather changes, air quality and the benefits of nasal washing.   Exacerbations: - Group verbal and written instruction to provide: warning signs, infection symptoms, calling MD promptly, preventive modes, and value of  vaccinations. Review: effective airway clearance, coughing and/or vibration techniques. Create an Sport and exercise psychologist.   Oxygen: - Individual and group verbal and written instruction on oxygen therapy. Includes supplement oxygen, available portable oxygen systems, continuous and intermittent flow rates, oxygen safety, concentrators, and Medicare reimbursement for oxygen.   Pulmonary Rehab from 12/20/2016 in [facility] Cardiac and Pulmonary Rehab  Date  12/20/16  Educator  [facility]  Instruction Review Code  1- Verbalizes Understanding      Respiratory Medications: - Group verbal and written instruction to review medications for lung disease. Drug class, frequency, complications, importance of spacers, rinsing mouth after steroid MDI's, and proper cleaning methods for nebulizers.   Pulmonary Rehab from 12/20/2016 in [facility] Cardiac and Pulmonary Rehab  Date  12/20/16  Educator  [facility]  Instruction Review Code  1- Verbalizes Understanding      AED/CPR: - Group verbal and written instruction with the use of models to demonstrate the basic use of the AED with the basic ABC's of resuscitation.   Pulmonary Rehab from 07/28/2015 in [facility] Cardiac and Pulmonary Rehab  Date  05/23/15  Educator  CE  Instruction Review Code (retired)  2- meets goals/outcomes      Breathing Retraining: - Provides individuals verbal and written instruction on purpose, frequency, and proper technique of diaphragmatic breathing and pursed-lipped breathing. Applies individual practice skills.   Pulmonary Rehab from 12/20/2016 in [facility] Cardiac and Pulmonary Rehab  Date  12/20/16  Educator  [facility]  Instruction Review Code  1- Verbalizes Understanding      Anatomy and Physiology of the Lungs: - Group verbal and written instruction with the use of models to provide basic lung anatomy and physiology related to function, structure and complications of lung disease.   Anatomy & Physiology of the Heart: - Group verbal and written instruction and models  provide basic cardiac anatomy and physiology, with the coronary electrical and arterial systems. Review of: AMI, Angina, Valve disease, Heart Failure, Cardiac Arrhythmia, Pacemakers, and the ICD.   Heart Failure: - Group verbal and written instruction on the basics of heart failure: signs/symptoms, treatments, explanation of ejection fraction, enlarged heart and cardiomyopathy.   Sleep Apnea: - Individual verbal and written instruction to review Obstructive Sleep Apnea. Review of risk factors, methods for diagnosing and types of masks and machines for OSA.   Pulmonary Rehab from 12/20/2016 in [facility] Cardiac and Pulmonary Rehab  Date  12/20/16  Educator  [facility]  Instruction Review Code  1- Verbalizes Understanding      Anxiety: - Provides group, verbal and written instruction on the correlation between heart/lung disease and anxiety, treatment options, and management of anxiety.   Relaxation: -  Provides group, verbal and written instruction about the benefits of relaxation for patients with heart/lung disease. Also provides patients with examples of relaxation techniques.   Cardiac Medications: - Group verbal and written instruction to review commonly prescribed medications for heart disease. Reviews the medication, class of the drug, and side effects.   Pulmonary Rehab from 07/28/2015 in [facility] Cardiac and Pulmonary Rehab  Date  04/25/15  Educator  CE  Instruction Review Code (retired)  2- meets goals/outcomes      Know Your Numbers: -Group verbal and written instruction about important numbers in your health.  Review of Cholesterol, Blood Pressure, Diabetes, and BMI and the role they play in your overall health.   Other: -Provides group and verbal instruction on various topics (see comments)    Knowledge Questionnaire Score:     Knowledge Questionnaire Score - 12/20/16 1538      Knowledge Questionnaire Score   Pre Score 9/10       Core Components/Risk Factors/Patient Goals  at Admission:     Personal Goals and Risk Factors at Admission - 12/20/16 1550      Core Components/Risk Factors/Patient Goals on Admission    Weight Management Yes;Weight Maintenance   Intervention Weight Management: Develop a combined nutrition and exercise program designed to reach desired caloric intake, while maintaining appropriate intake of nutrient and fiber, sodium and fats, and appropriate energy expenditure required for the weight goal.;Weight Management: Provide education and appropriate resources to help participant work on and attain dietary goals.;Weight Management/Obesity: Establish reasonable short term and long term weight goals.   Admit Weight 156 lb (70.8 kg)   Goal Weight: Short Term 151 lb (68.5 kg)   Goal Weight: Long Term 151 lb (68.5 kg)   Expected Outcomes Short Term: Continue to assess and modify interventions until short term weight is achieved;Long Term: Adherence to nutrition and physical activity/exercise program aimed toward attainment of established weight goal;Weight Maintenance: Understanding of the daily nutrition guidelines, which includes 25-35% calories from fat, 7% or less cal from saturated fats, less than  cholesterol, less than 1.5gm of sodium, & 5 or more servings of fruits and vegetables daily;Understanding recommendations for meals to include 15-35% energy as protein, 25-35% energy from fat, 35-60% energy from carbohydrates, less than  of dietary cholesterol, 20-35 gm of total fiber daily;Understanding of distribution of calorie intake throughout the day with the consumption of 4-5 meals/snacks   Lipids Yes   Intervention Provide education and support for participant on nutrition & aerobic/resistive exercise along with prescribed medications to achieve LDL 70mg , HDL >40mg .   Expected Outcomes Short Term: Participant states understanding of desired cholesterol values and is compliant with medications prescribed. Participant is following exercise  prescription and nutrition guidelines.;Long Term: Cholesterol controlled with medications as prescribed, with individualized exercise RX and with personalized nutrition plan. Value goals: LDL < , HDL > 40 mg.   Stress Yes   Intervention Offer individual and/or small group education and counseling on adjustment to heart disease, stress management and health-related lifestyle change. Teach and support self-help strategies.;Refer participants experiencing significant psychosocial distress to appropriate mental health specialists for further evaluation and treatment. When possible, include family members and significant others in education/counseling sessions.   Expected Outcomes Short Term: Participant demonstrates changes in health-related behavior, relaxation and other stress management skills, ability to obtain effective social support, and compliance with psychotropic medications if prescribed.;Long Term: Emotional wellbeing is indicated by absence of clinically significant psychosocial distress or social isolation.  Core Components/Risk Factors/Patient Goals Review:    Core Components/Risk Factors/Patient Goals at Discharge (Final Review):    ITP Comments:     ITP Comments    Row Name 12/20/16 1638           ITP Comments Medical Evaluation completed. Chart sent to Dr. Bethann Punches Director of [facility]. Medical Diagnosis can be found in [facility] encounter 12/20/16          Comments: Initial ITP

## 2016-12-21 DIAGNOSIS — Z79899 Other long term (current) drug therapy: Secondary | ICD-10-CM | POA: Diagnosis not present

## 2016-12-23 DIAGNOSIS — G4733 Obstructive sleep apnea (adult) (pediatric): Secondary | ICD-10-CM | POA: Diagnosis not present

## 2016-12-23 DIAGNOSIS — R0602 Shortness of breath: Secondary | ICD-10-CM | POA: Diagnosis not present

## 2016-12-23 DIAGNOSIS — J84112 Idiopathic pulmonary fibrosis: Secondary | ICD-10-CM | POA: Diagnosis not present

## 2016-12-24 ENCOUNTER — Encounter: Payer: Medicare Other | Admitting: *Deleted

## 2016-12-24 DIAGNOSIS — Z955 Presence of coronary angioplasty implant and graft: Secondary | ICD-10-CM | POA: Diagnosis not present

## 2016-12-24 DIAGNOSIS — I2510 Atherosclerotic heart disease of native coronary artery without angina pectoris: Secondary | ICD-10-CM | POA: Diagnosis not present

## 2016-12-24 DIAGNOSIS — Z7982 Long term (current) use of aspirin: Secondary | ICD-10-CM | POA: Diagnosis not present

## 2016-12-24 DIAGNOSIS — G4730 Sleep apnea, unspecified: Secondary | ICD-10-CM | POA: Diagnosis not present

## 2016-12-24 DIAGNOSIS — J8410 Pulmonary fibrosis, unspecified: Secondary | ICD-10-CM | POA: Diagnosis not present

## 2016-12-24 DIAGNOSIS — E785 Hyperlipidemia, unspecified: Secondary | ICD-10-CM | POA: Diagnosis not present

## 2016-12-24 DIAGNOSIS — Z79899 Other long term (current) drug therapy: Secondary | ICD-10-CM | POA: Diagnosis not present

## 2016-12-24 DIAGNOSIS — Z7951 Long term (current) use of inhaled steroids: Secondary | ICD-10-CM | POA: Diagnosis not present

## 2016-12-24 DIAGNOSIS — I10 Essential (primary) hypertension: Secondary | ICD-10-CM | POA: Diagnosis not present

## 2016-12-24 NOTE — Progress Notes (Signed)
Daily Session Note  Patient Details  Name: Vincent Kennedy MRN: [id number] Date of Birth: 01-23-1933 Referring Provider:     Pulmonary Rehab from 12/20/2016 in [facility] Cardiac and Pulmonary Rehab  Referring Provider  Devona Konig MD      Encounter Date: 12/24/2016  Check In:     Session Check In - 12/24/16 1009      Check-In   Location [facility]-Cardiac & Pulmonary Rehab   Staff Present Nada Maclachlan, BA, ACSM CEP, Exercise Physiologist;Crystallynn Noorani, RN, BSN;Joseph Goldman [employer] physician immediately available to respond to emergencies [name] immediately available ER MD   Physician(s) Dr. Corky Downs and Dr. Reita Cliche   Medication changes reported     No   Fall or balance concerns reported    No   Tobacco Cessation No Change   Warm-up and Cool-down Performed on first and last piece of equipment   Resistance Training Performed Yes   VAD Patient? No     Pain Assessment   Currently in Pain? No/denies         History  Smoking Status  . Never Smoker  Smokeless Tobacco  . Never Used    Goals Met:  Proper associated with RPD/PD & O2 Sat Independence with exercise equipment Exercise tolerated well  Goals Unmet:  Not Applicable  Comments: First full day of exercise!  Patient was oriented to gym and equipment including functions, settings, policies, and procedures.  Patient's individual exercise prescription and treatment plan were reviewed.  All starting workloads were established based on the results of the 6 minute walk test done at initial orientation visit.  The plan for exercise progression was also introduced and progression will be customized based on patient's performance and goals.     Dr. Emily Filbert is Medical Director for [place] and [name].

## 2016-12-27 DIAGNOSIS — J8410 Pulmonary fibrosis, unspecified: Secondary | ICD-10-CM | POA: Diagnosis not present

## 2016-12-27 DIAGNOSIS — Z79899 Other long term (current) drug therapy: Secondary | ICD-10-CM | POA: Diagnosis not present

## 2016-12-27 DIAGNOSIS — E785 Hyperlipidemia, unspecified: Secondary | ICD-10-CM | POA: Diagnosis not present

## 2016-12-27 DIAGNOSIS — I2510 Atherosclerotic heart disease of native coronary artery without angina pectoris: Secondary | ICD-10-CM | POA: Diagnosis not present

## 2016-12-27 DIAGNOSIS — Z7951 Long term (current) use of inhaled steroids: Secondary | ICD-10-CM | POA: Diagnosis not present

## 2016-12-27 DIAGNOSIS — G4730 Sleep apnea, unspecified: Secondary | ICD-10-CM | POA: Diagnosis not present

## 2016-12-27 DIAGNOSIS — Z955 Presence of coronary angioplasty implant and graft: Secondary | ICD-10-CM | POA: Diagnosis not present

## 2016-12-27 DIAGNOSIS — I10 Essential (primary) hypertension: Secondary | ICD-10-CM | POA: Diagnosis not present

## 2016-12-27 DIAGNOSIS — Z7982 Long term (current) use of aspirin: Secondary | ICD-10-CM | POA: Diagnosis not present

## 2016-12-27 NOTE — Progress Notes (Signed)
Daily Session Note  Patient Details  Name: Vincent Kennedy MRN: [id number] Date of Birth: 1933/01/26 Referring Provider:     Pulmonary Rehab from 12/20/2016 in [facility] Cardiac and Pulmonary Rehab  Referring Provider  Devona Konig MD      Encounter Date: 12/27/2016  Check In:     Session Check In - 12/27/16 1157      Check-In   Location [facility]-Cardiac & Pulmonary Rehab   Staff Present Nada Maclachlan, BA, ACSM CEP, Exercise Physiologist;Kelly Amedeo Plenty, BS, ACSM CEP, Exercise Physiologist;Nijee Heatwole Flavia Shipper   Supervising physician immediately available to respond to emergencies [name] immediately available ER MD   Physician(s) Dr. Jimmye Norman and [facility]   Medication changes reported     No   Fall or balance concerns reported    No   Warm-up and Cool-down Performed as group-led instruction   Resistance Training Performed Yes   VAD Patient? No     Pain Assessment   Currently in Pain? No/denies   Multiple Pain Sites No         History  Smoking Status  . Never Smoker  Smokeless Tobacco  . Never Used    Goals Met:  Independence with exercise equipment Exercise tolerated well No report of cardiac concerns or symptoms Strength training completed today  Goals Unmet:  Not Applicable  Comments: Pt able to follow exercise prescription today without complaint.  Will continue to monitor for progression.   Dr. Emily Filbert is Medical Director for [place] and [name].

## 2016-12-28 ENCOUNTER — Telehealth: Payer: Self-pay | Admitting: Cardiovascular Disease

## 2016-12-28 ENCOUNTER — Other Ambulatory Visit: Payer: Self-pay

## 2016-12-28 DIAGNOSIS — R262 Difficulty in walking, not elsewhere classified: Secondary | ICD-10-CM | POA: Diagnosis not present

## 2016-12-28 DIAGNOSIS — Z01812 Encounter for preprocedural laboratory examination: Secondary | ICD-10-CM

## 2016-12-28 NOTE — Progress Notes (Unsigned)
bme 

## 2016-12-28 NOTE — Telephone Encounter (Signed)
Per Dr. [name], "The patient needs to be scheduled for a right and left cardiac catheterization for chest pain and shortness of breath. Schedule for next week on Thursday or Friday. They are expecting a phone call from you. No need for chest x-ray but he will need precath labs."  S/w pt who would prefer to have cath at [facility]. No availability on October 17; pt agreeable to October 24, 8:30am arrival. He understands to have pre-cath labs this week at [facility]. Instructions reviewed with patient and put on his MyChart

## 2016-12-29 ENCOUNTER — Other Ambulatory Visit
Admission: RE | Admit: 2016-12-29 | Discharge: 2016-12-29 | Disposition: A | Discharge status: Home / Self Care | Payer: Medicare Other | Source: Ambulatory Visit | Attending: Cardiovascular Disease | Admitting: Cardiovascular Disease

## 2016-12-29 DIAGNOSIS — Z01812 Encounter for preprocedural laboratory examination: Secondary | ICD-10-CM | POA: Diagnosis not present

## 2016-12-29 DIAGNOSIS — Z955 Presence of coronary angioplasty implant and graft: Secondary | ICD-10-CM | POA: Diagnosis not present

## 2016-12-29 DIAGNOSIS — Z79899 Other long term (current) drug therapy: Secondary | ICD-10-CM | POA: Diagnosis not present

## 2016-12-29 DIAGNOSIS — G4730 Sleep apnea, unspecified: Secondary | ICD-10-CM | POA: Diagnosis not present

## 2016-12-29 DIAGNOSIS — J8410 Pulmonary fibrosis, unspecified: Secondary | ICD-10-CM | POA: Diagnosis not present

## 2016-12-29 DIAGNOSIS — I10 Essential (primary) hypertension: Secondary | ICD-10-CM | POA: Diagnosis not present

## 2016-12-29 DIAGNOSIS — Z7951 Long term (current) use of inhaled steroids: Secondary | ICD-10-CM | POA: Diagnosis not present

## 2016-12-29 DIAGNOSIS — E785 Hyperlipidemia, unspecified: Secondary | ICD-10-CM | POA: Diagnosis not present

## 2016-12-29 DIAGNOSIS — I2510 Atherosclerotic heart disease of native coronary artery without angina pectoris: Secondary | ICD-10-CM | POA: Diagnosis not present

## 2016-12-29 DIAGNOSIS — Z7982 Long term (current) use of aspirin: Secondary | ICD-10-CM | POA: Diagnosis not present

## 2016-12-29 LAB — BASIC METABOLIC PANEL
ANION GAP: 6 (ref 5–15)
BUN: 13 mg/dL (ref 6–20)
CALCIUM: 8.8 mg/dL — AB (ref 8.9–10.3)
CHLORIDE: 105 mmol/L (ref 101–111)
CO2: 28 mmol/L (ref 22–32)
Creatinine, Ser: 0.84 mg/dL (ref 0.61–1.24)
GFR calc non Af Amer: >60 mL/min (ref >60)
GLUCOSE: 72 mg/dL (ref 65–99)
POTASSIUM: 3.8 mmol/L (ref 3.5–5.1)
Sodium: 139 mmol/L (ref 135–145)

## 2016-12-29 LAB — PROTIME-INR
INR: 1.09
PROTHROMBIN TIME: 14 s (ref 11.4–15.2)

## 2016-12-29 LAB — CBC
HEMATOCRIT: 43 % (ref 40.0–52.0)
HEMOGLOBIN: 14.3 g/dL (ref 13.0–18.0)
MCH: 29.3 pg (ref 26.0–34.0)
MCHC: 33.3 g/dL (ref 32.0–36.0)
MCV: 87.8 fL (ref 80.0–100.0)
Platelets: 206 10*3/uL (ref 150–440)
RBC: 4.9 MIL/uL (ref 4.40–5.90)
RDW: 14.6 % — ABNORMAL HIGH (ref 11.5–14.5)
WBC: 8 10*3/uL (ref 3.8–10.6)

## 2016-12-29 NOTE — Progress Notes (Signed)
Daily Session Note  Patient Details  Name: Vincent Kennedy MRN: [id number] Date of Birth: Mar 01, 1933 Referring Provider:     Pulmonary Rehab from 12/20/2016 in [facility] Cardiac and Pulmonary Rehab  Referring Provider  Devona Konig MD      Encounter Date: 12/29/2016  Check In:     Session Check In - 12/29/16 1137      Check-In   Staff Present Justin Mend RCP,RRT,BSRT;Meredith Sherryll Burger, RN BSN;Jessica Luan Pulling, MA, ACSM RCEP, Exercise Physiologist   Supervising physician immediately available to respond to emergencies [name] immediately available ER MD   Physician(s) Dr. Cinda Quest and Jimmye Norman   Medication changes reported     No   Fall or balance concerns reported    No   Warm-up and Cool-down Performed as group-led instruction   Resistance Training Performed Yes   VAD Patient? No     Pain Assessment   Currently in Pain? No/denies   Multiple Pain Sites No           Exercise Prescription Changes - 12/28/16 1600      Response to Exercise   Blood Pressure (Admit) 128/54   Blood Pressure (Exercise) 130/80   Blood Pressure (Exit) 122/64   Heart Rate (Admit) 74 bpm   Heart Rate (Exercise) 92 bpm   Heart Rate (Exit) 72 bpm   Oxygen Saturation (Admit) 96 %   Oxygen Saturation (Exercise) 87 %   Oxygen Saturation (Exit) 99 %   Rating of Perceived Exertion (Exercise) 11   Perceived Dyspnea (Exercise) 1   Symptoms none   Comments second full day of exercise   Duration Progress to 45 minutes of aerobic exercise without signs/symptoms of physical distress   Intensity THRR unchanged     Progression   Progression Continue to progress workloads to maintain intensity without signs/symptoms of physical distress.   Average METs 2.25     Resistance Training   Training Prescription Yes   Weight 3 lbs   Reps 10-15     Interval Training   Interval Training No     Oxygen   Oxygen Continuous   Liters 3     Treadmill   MPH 1.5   Grade 0.5   Minutes 1   METs 2.25     NuStep    Level 2   SPM 100   Minutes 15   METs 1.9     REL-XR   Level 1   Speed 44   Minutes 15   METs 2.6      History  Smoking Status  . Never Smoker  Smokeless Tobacco  . Never Used    Goals Met:  Exercise tolerated well No report of cardiac concerns or symptoms Strength training completed today  Goals Unmet:  Not Applicable  Comments: Pt able to follow exercise prescription today without complaint.  Will continue to monitor for progression.   Dr. Emily Filbert is Medical Director for [place] and [name].

## 2016-12-31 DIAGNOSIS — G4730 Sleep apnea, unspecified: Secondary | ICD-10-CM | POA: Diagnosis not present

## 2016-12-31 DIAGNOSIS — Z7982 Long term (current) use of aspirin: Secondary | ICD-10-CM | POA: Diagnosis not present

## 2016-12-31 DIAGNOSIS — I2510 Atherosclerotic heart disease of native coronary artery without angina pectoris: Secondary | ICD-10-CM | POA: Diagnosis not present

## 2016-12-31 DIAGNOSIS — Z79899 Other long term (current) drug therapy: Secondary | ICD-10-CM | POA: Diagnosis not present

## 2016-12-31 DIAGNOSIS — Z7951 Long term (current) use of inhaled steroids: Secondary | ICD-10-CM | POA: Diagnosis not present

## 2016-12-31 DIAGNOSIS — J8410 Pulmonary fibrosis, unspecified: Secondary | ICD-10-CM

## 2016-12-31 DIAGNOSIS — Z955 Presence of coronary angioplasty implant and graft: Secondary | ICD-10-CM | POA: Diagnosis not present

## 2016-12-31 DIAGNOSIS — E785 Hyperlipidemia, unspecified: Secondary | ICD-10-CM | POA: Diagnosis not present

## 2016-12-31 DIAGNOSIS — I10 Essential (primary) hypertension: Secondary | ICD-10-CM | POA: Diagnosis not present

## 2016-12-31 NOTE — Progress Notes (Signed)
Daily Session Note  Patient Details  Name: Vincent Kennedy MRN: [id number] Date of Birth: June 28, 1932 Referring Provider:     Pulmonary Rehab from 12/20/2016 in [facility] Cardiac and Pulmonary Rehab  Referring Provider  Devona Konig MD      Encounter Date: 12/31/2016  Check In:     Session Check In - 12/31/16 1000      Check-In   Location [facility]-Cardiac & Pulmonary Rehab   Staff Present Nada Maclachlan, BA, ACSM CEP, Exercise Physiologist;Meredith Sherryll Burger, RN BSN;Tatjana Turcott Flavia Shipper   Supervising physician immediately available to respond to emergencies [name] immediately available ER MD   Physician(s) Dr. Cinda Quest and Clearnce Hasten   Medication changes reported     No   Fall or balance concerns reported    No   Warm-up and Cool-down Performed as group-led instruction   Resistance Training Performed Yes   VAD Patient? No     Pain Assessment   Currently in Pain? No/denies   Multiple Pain Sites No         History  Smoking Status  . Never Smoker  Smokeless Tobacco  . Never Used    Goals Met:  Exercise tolerated well No report of cardiac concerns or symptoms Strength training completed today  Goals Unmet:  Not Applicable  Comments: Pt able to follow exercise prescription today without complaint.  Will continue to monitor for progression.   Dr. Emily Filbert is Medical Director for [place] and [name].

## 2017-01-02 IMAGING — CT CT CHEST HIGH RESOLUTION W/O CM
1 of 3 series · 14 of 31 positions shown, 18 images · non-contrast
Comparison: 03/26/2015 high-resolution chest CT.

CLINICAL DATA: 83-year-old male with a reported history of
pulmonary fibrosis, presenting for follow-up.

EXAM:
CT CHEST WITHOUT CONTRAST
TECHNIQUE: Multidetector CT imaging of the chest was performed following the
standard protocol without intravenous contrast. High resolution
imaging of the lungs, as well as inspiratory and expiratory imaging,
was performed.

[Series 2: thorax · axial · 0.64mm/px · z∈[-732,-492]mm · 14 of 132 slices shown, 18 images]
[im 6/132  mediastinal]
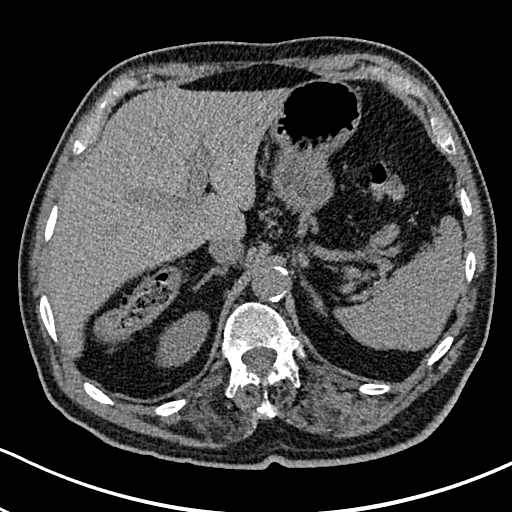
[im 6/132  lung]
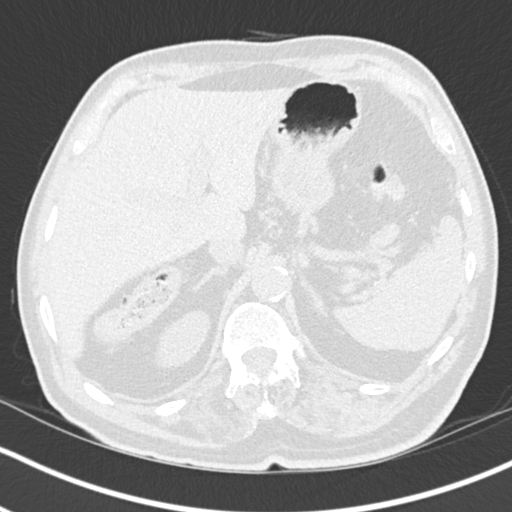
[im 18/132  lung]
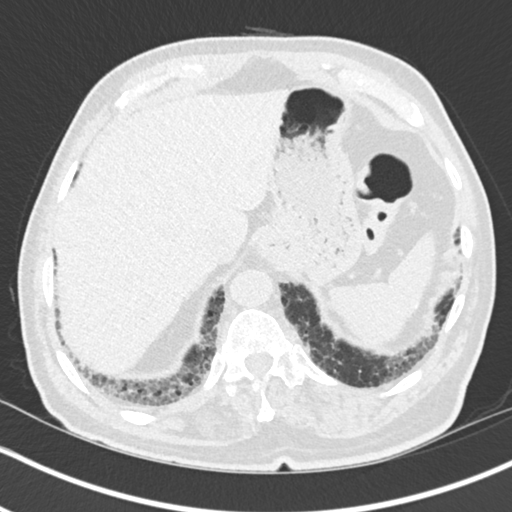
[im 29/132  lung]
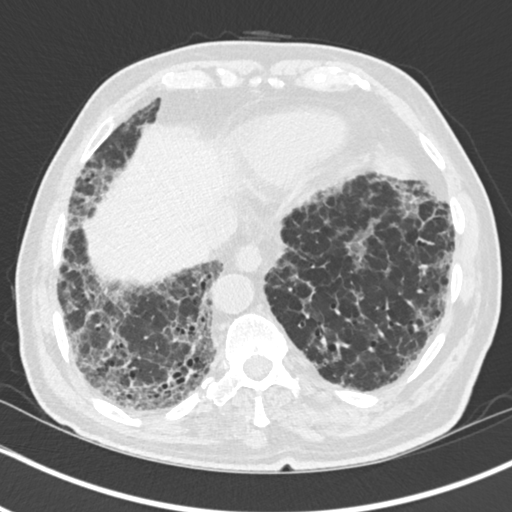
[im 40/132  lung]
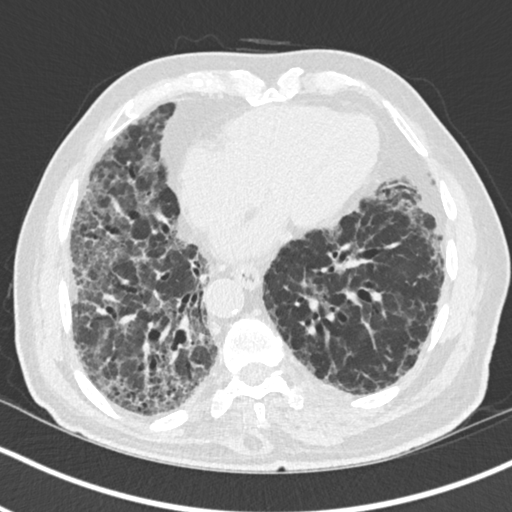
[im 46/132  mediastinal]
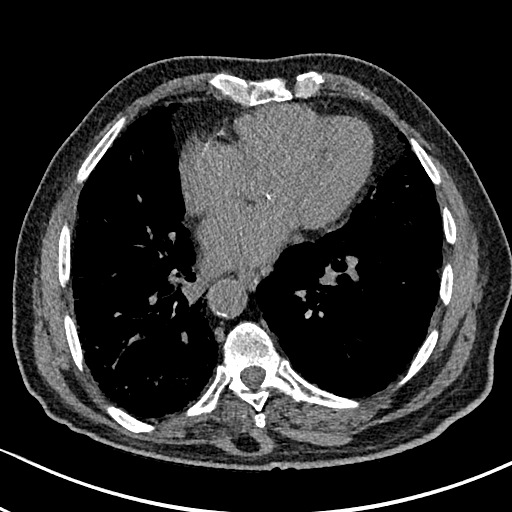
[im 46/132  lung]
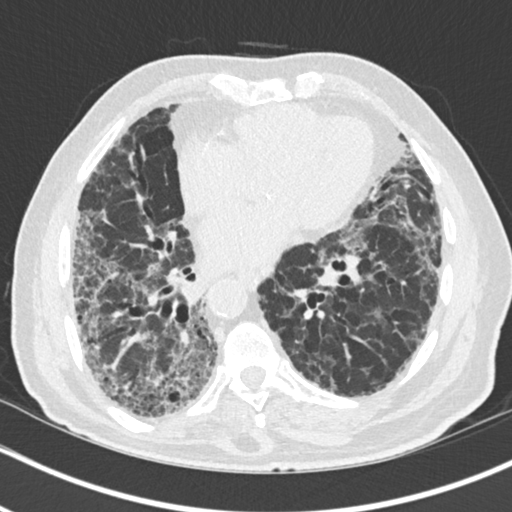
[im 52/132  lung]
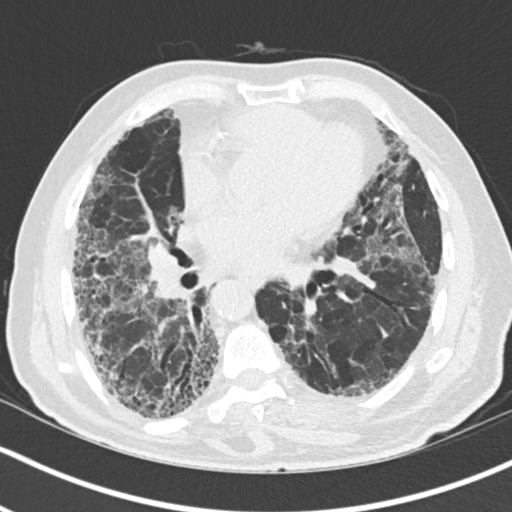
[im 62/132  lung]
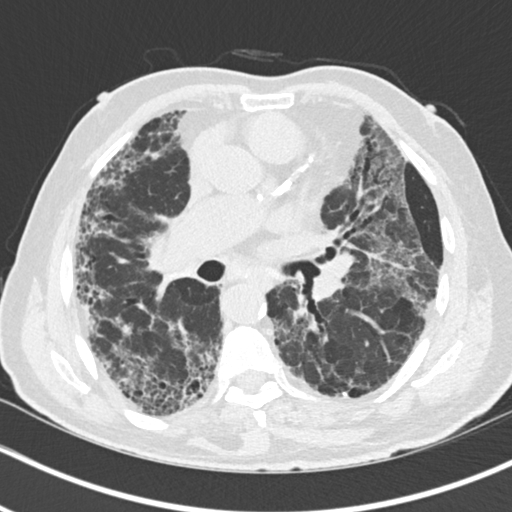
[im 69/132  lung]
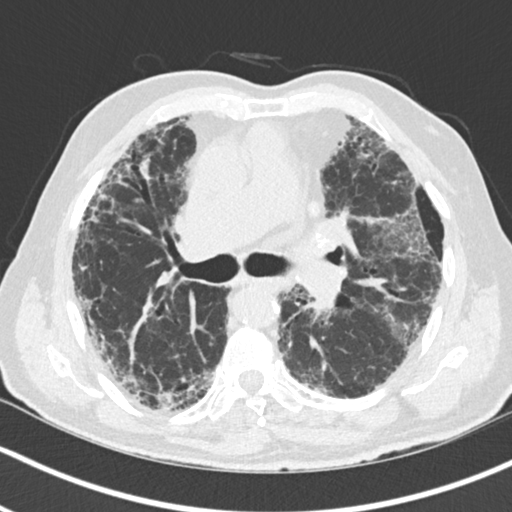
[im 80/132  mediastinal]
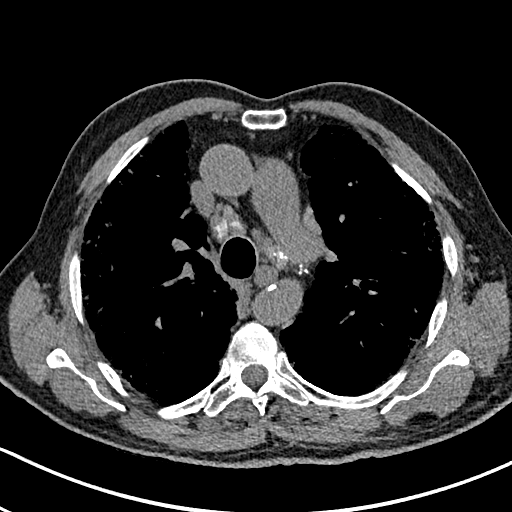
[im 80/132  lung]
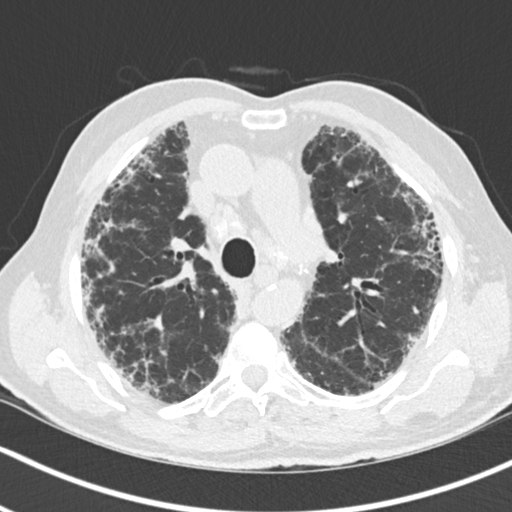
[im 86/132  lung]
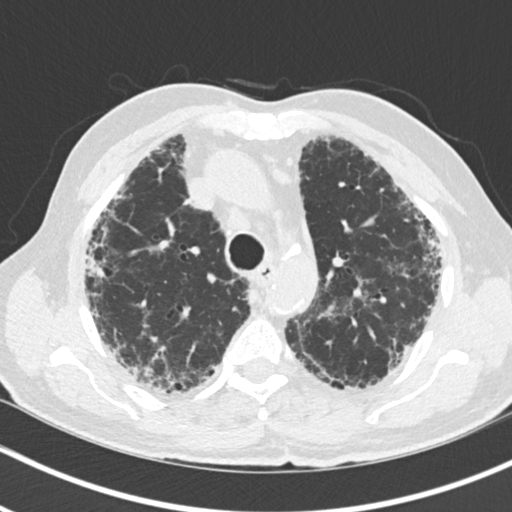
[im 92/132  lung]
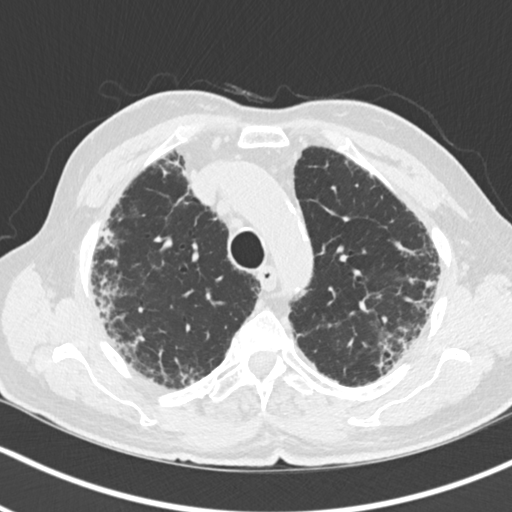
[im 103/132  lung]
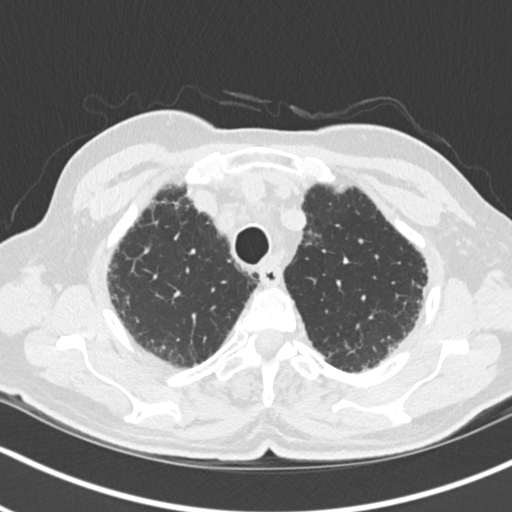
[im 114/132  mediastinal]
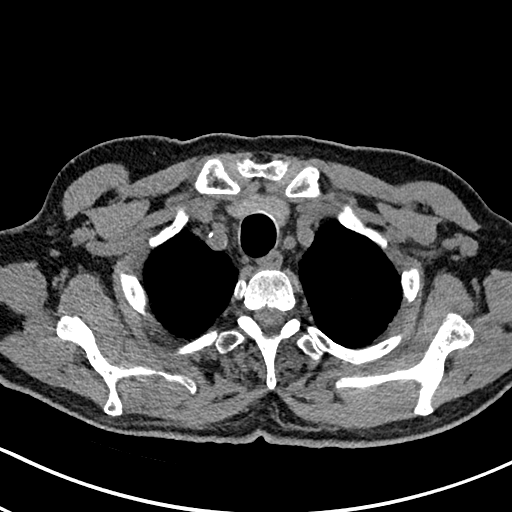
[im 114/132  lung]
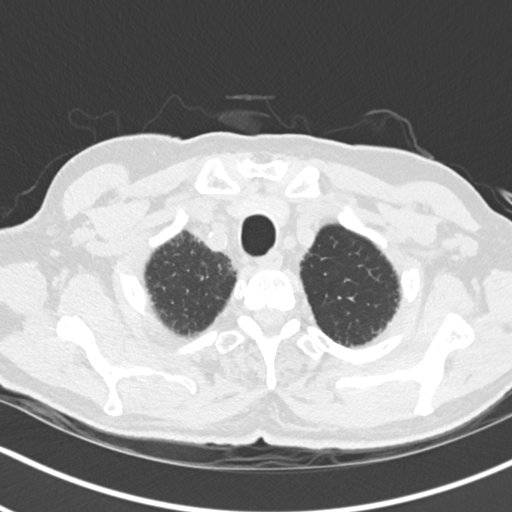
[im 126/132  lung]
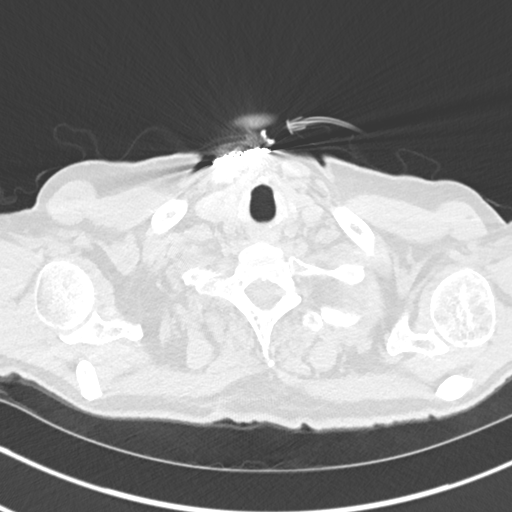

[14 of 31 positions shown; findings below may reference images not displayed]

FINDINGS: Mediastinum/Nodes: Normal heart size. No significant pericardial
fluid/thickening. Left main, left anterior descending, left
circumflex and right coronary atherosclerosis. Atherosclerotic
nonaneurysmal thoracic aorta. Stable dilated main pulmonary artery
(3.7 cm diameter). No discrete thyroid nodules. Unremarkable
esophagus. Mildly enlarged left supraclavicular nodes measuring up
to the 1.2 cm (series 2/image 1), previously 1.0 cm, slightly
increased. No axillary adenopathy. Mildly enlarged right
paratracheal nodes up to 1.4 cm (series 2/ image 52) with a
amorphous internal calcification, previously 1.1 cm, mildly
increased. Mildly enlarged 1.3 cm subcarinal node with an amorphous
peripheral calcifications (series 2/ image 70), previously 1.0 cm,
mildly increased. Mildly enlarged 1.0 cm AP window node with
amorphous internal calcifications, newly enlarged. No additional
pathologically enlarged mediastinal or gross hilar nodes on this
noncontrast study. Coarsely calcified left hilar nodes and faintly
calcified bilateral hilar nodes are again noted.

Lungs/Pleura: No pneumothorax. No pleural effusion. No acute
consolidative airspace disease, lung masses or significant pulmonary
nodules. There is extensive patchy subpleural reticulation and
traction bronchiectasis in both lungs with a basilar predominance,
with clear interval worsening. There are patchy mild regions of
honeycombing in both lungs, most prominent in the peripheral left
upper lobe and posterior right lower lobe, mildly worsened. There is
a stable large focus of air trapping in the left upper lobe. No
significant air trapping in the remaining lungs.

Upper abdomen: Stable linear calcification at the periphery of the
spleen, consistent with the sequela of a remote insult.

Musculoskeletal: No aggressive appearing focal osseous lesions. Mild
thoracic spondylosis.
IMPRESSION: 1. Interval progression of basilar predominant fibrotic interstitial
lung disease with honeycombing, considered diagnostic of usual
interstitial pneumonia (UIP).
2. Stable large asymmetric focus of air trapping in the left upper
lobe, favored to represent the sequela of remote infectious or
inflammatory insult to a left upper lobe segmental bronchus.
3. Mild to moderate left supraclavicular and mediastinal
lymphadenopathy, mildly increased, nonspecific, probably reactive.
Recommend attention on follow-up chest CT. If there is clinical
concern for a lymphoproliferative condition, the left
supraclavicular nodes should be amenable to ultrasound-guided
biopsy.
4. Additional findings include aortic atherosclerosis, coronary
atherosclerosis and stable dilated main pulmonary artery suggestive
of chronic pulmonary arterial hypertension .

## 2017-01-03 DIAGNOSIS — Z955 Presence of coronary angioplasty implant and graft: Secondary | ICD-10-CM | POA: Diagnosis not present

## 2017-01-03 DIAGNOSIS — Z79899 Other long term (current) drug therapy: Secondary | ICD-10-CM | POA: Diagnosis not present

## 2017-01-03 DIAGNOSIS — E785 Hyperlipidemia, unspecified: Secondary | ICD-10-CM | POA: Diagnosis not present

## 2017-01-03 DIAGNOSIS — J8410 Pulmonary fibrosis, unspecified: Secondary | ICD-10-CM | POA: Diagnosis not present

## 2017-01-03 DIAGNOSIS — I10 Essential (primary) hypertension: Secondary | ICD-10-CM | POA: Diagnosis not present

## 2017-01-03 DIAGNOSIS — Z7982 Long term (current) use of aspirin: Secondary | ICD-10-CM | POA: Diagnosis not present

## 2017-01-03 DIAGNOSIS — I2510 Atherosclerotic heart disease of native coronary artery without angina pectoris: Secondary | ICD-10-CM | POA: Diagnosis not present

## 2017-01-03 DIAGNOSIS — G4730 Sleep apnea, unspecified: Secondary | ICD-10-CM | POA: Diagnosis not present

## 2017-01-03 DIAGNOSIS — Z7951 Long term (current) use of inhaled steroids: Secondary | ICD-10-CM | POA: Diagnosis not present

## 2017-01-03 NOTE — Progress Notes (Signed)
Daily Session Note  Patient Details  Name: Vincent Kennedy MRN: [id number] Date of Birth: 03-Oct-1932 Referring Provider:     Pulmonary Rehab from 12/20/2016 in [facility] Cardiac and Pulmonary Rehab  Referring Provider  Devona Konig MD      Encounter Date: 01/03/2017  Check In:     Session Check In - 01/03/17 1122      Check-In   Location [facility]-Cardiac & Pulmonary Rehab   Staff Present Earlean Shawl, BS, ACSM CEP, Exercise Physiologist;Laureen Owens Shark, BS, RRT, Respiratory Therapist;Kandie Keiper Flavia Shipper   Supervising physician immediately available to respond to emergencies [name] immediately available ER MD   Physician(s) Dr. Cherylann Banas and Corky Downs   Medication changes reported     No   Fall or balance concerns reported    No   Warm-up and Cool-down Performed as group-led instruction   Resistance Training Performed Yes   VAD Patient? No     Pain Assessment   Currently in Pain? No/denies   Multiple Pain Sites No         History  Smoking Status  . Never Smoker  Smokeless Tobacco  . Never Used    Goals Met:  Proper associated with RPD/PD & O2 Sat Exercise tolerated well No report of cardiac concerns or symptoms Strength training completed today  Goals Unmet:  Not Applicable  Comments: Pt able to follow exercise prescription today without complaint.  Will continue to monitor for progression.   Dr. Emily Filbert is Medical Director for [place] and [name].

## 2017-01-04 DIAGNOSIS — R262 Difficulty in walking, not elsewhere classified: Secondary | ICD-10-CM | POA: Diagnosis not present

## 2017-01-05 DIAGNOSIS — J8410 Pulmonary fibrosis, unspecified: Secondary | ICD-10-CM

## 2017-01-05 DIAGNOSIS — Z7982 Long term (current) use of aspirin: Secondary | ICD-10-CM | POA: Diagnosis not present

## 2017-01-05 DIAGNOSIS — I2510 Atherosclerotic heart disease of native coronary artery without angina pectoris: Secondary | ICD-10-CM | POA: Diagnosis not present

## 2017-01-05 DIAGNOSIS — I10 Essential (primary) hypertension: Secondary | ICD-10-CM | POA: Diagnosis not present

## 2017-01-05 DIAGNOSIS — Z7951 Long term (current) use of inhaled steroids: Secondary | ICD-10-CM | POA: Diagnosis not present

## 2017-01-05 DIAGNOSIS — Z79899 Other long term (current) drug therapy: Secondary | ICD-10-CM | POA: Diagnosis not present

## 2017-01-05 DIAGNOSIS — Z955 Presence of coronary angioplasty implant and graft: Secondary | ICD-10-CM | POA: Diagnosis not present

## 2017-01-05 DIAGNOSIS — E785 Hyperlipidemia, unspecified: Secondary | ICD-10-CM | POA: Diagnosis not present

## 2017-01-05 DIAGNOSIS — G4730 Sleep apnea, unspecified: Secondary | ICD-10-CM | POA: Diagnosis not present

## 2017-01-05 NOTE — Progress Notes (Signed)
Daily Session Note  Patient Details  Name: Vincent Kennedy MRN: [id number] Date of Birth: [birth year] Referring Provider:     Pulmonary Rehab from 12/20/2016 in [facility] Cardiac and Pulmonary Rehab  Referring Provider  Devona Konig MD      Encounter Date: 01/05/2017  Check In:     Session Check In - 01/05/17 1144      Check-In   Staff Present Gerlene Burdock, RN, Geralyn Corwin, RN BSN;Joseph Flavia Shipper   Supervising physician immediately available to respond to emergencies [name] immediately available ER MD   Physician(s) Dr. Mariea Clonts and Joni Fears   Medication changes reported     No   Fall or balance concerns reported    No   Warm-up and Cool-down Performed as group-led instruction   Resistance Training Performed Yes   VAD Patient? No     Pain Assessment   Currently in Pain? No/denies   Multiple Pain Sites No         History  Smoking Status  . Never Smoker  Smokeless Tobacco  . Never Used    Goals Met:  Exercise tolerated well No report of cardiac concerns or symptoms Strength training completed today  Goals Unmet:  Not Applicable  Comments: Pt able to follow exercise prescription today without complaint.  Will continue to monitor for progression.   Dr. Emily Filbert is Medical Director for [place] and [name].

## 2017-01-10 DIAGNOSIS — Z7982 Long term (current) use of aspirin: Secondary | ICD-10-CM | POA: Diagnosis not present

## 2017-01-10 DIAGNOSIS — J8410 Pulmonary fibrosis, unspecified: Secondary | ICD-10-CM | POA: Diagnosis not present

## 2017-01-10 DIAGNOSIS — I10 Essential (primary) hypertension: Secondary | ICD-10-CM | POA: Diagnosis not present

## 2017-01-10 DIAGNOSIS — G4730 Sleep apnea, unspecified: Secondary | ICD-10-CM | POA: Diagnosis not present

## 2017-01-10 DIAGNOSIS — Z79899 Other long term (current) drug therapy: Secondary | ICD-10-CM | POA: Diagnosis not present

## 2017-01-10 DIAGNOSIS — Z955 Presence of coronary angioplasty implant and graft: Secondary | ICD-10-CM | POA: Diagnosis not present

## 2017-01-10 DIAGNOSIS — Z7951 Long term (current) use of inhaled steroids: Secondary | ICD-10-CM | POA: Diagnosis not present

## 2017-01-10 DIAGNOSIS — I2510 Atherosclerotic heart disease of native coronary artery without angina pectoris: Secondary | ICD-10-CM | POA: Diagnosis not present

## 2017-01-10 DIAGNOSIS — E785 Hyperlipidemia, unspecified: Secondary | ICD-10-CM | POA: Diagnosis not present

## 2017-01-10 NOTE — Progress Notes (Signed)
Pulmonary Individual Treatment Plan  Patient Details  Name: Vincent Kennedy MRN: [id number] Date of Birth: May 18, 1932 Referring Provider:     Pulmonary Rehab from 12/20/2016 in [facility] Cardiac and Pulmonary Rehab  Referring Provider  Freda Munro MD      Initial Encounter Date:    Pulmonary Rehab from 12/20/2016 in [facility] Cardiac and Pulmonary Rehab  Date  12/20/16  Referring Provider  Freda Munro MD      Visit Diagnosis: Pulmonary fibrosis (HCC)  Patient's Home Medications on Admission:  Current Outpatient Prescriptions:  .  albuterol (PROVENTIL HFA;VENTOLIN HFA) 108 (90 Base) MCG/ACT inhaler, Inhale 2 puffs into the lungs every 6 (six) hours as needed for wheezing or shortness of breath., Disp: 1 Inhaler, Rfl: 2 .  aspirin EC 325 MG tablet, Take 1 tablet (325 mg total) by mouth daily., Disp: 30 tablet, Rfl: 0 .  budesonide (PULMICORT) 0.5 MG/2ML nebulizer solution, Take 2 mLs (0.5 mg total) by nebulization 2 (two) times daily., Disp: 2 mL, Rfl: 12 .  carvedilol (COREG) 12.5 MG tablet, Take 0.5 tablets (6.25 mg total) by mouth 2 (two) times daily., Disp: 90 tablet, Rfl: 1 .  finasteride (PROSCAR) 5 MG tablet, Take 1 tablet (5 mg total) by mouth daily., Disp: 90 tablet, Rfl: 1 .  ipratropium-albuterol (DUONEB) 0.5-2.5 (3) MG/3ML SOLN, Take 3 mLs by nebulization every 6 (six) hours. (Patient not taking: Reported on 01/04/2017), Disp: 360 mL, Rfl: 2 .  mirtazapine (REMERON) 15 MG tablet, Take 1 tablet (15 mg total) by mouth at bedtime., Disp: 30 tablet, Rfl: 11 .  mometasone-formoterol (DULERA) 100-5 MCG/ACT AERO, Inhale 2 puffs into the lungs 2 (two) times daily., Disp: 1 Inhaler, Rfl: 1 .  omeprazole (PRILOSEC OTC) 20 MG tablet, Take 20 mg by mouth daily as needed (for acid reflux)., Disp: , Rfl:  .  pantoprazole (PROTONIX) 40 MG tablet, Take 1 tablet (40 mg total) by mouth daily as needed. Caution:prolonged use may increase risk of pneumonia, colitis, osteoporosis, anemia (Patient not  taking: Reported on 01/04/2017), Disp: 30 tablet, Rfl: 1 .  Pirfenidone (ESBRIET) 267 MG CAPS, Take 801 mg by mouth 3 (three) times daily. , Disp: , Rfl:  .  rivastigmine (EXELON) 9.5 mg/24hr, Place 1 patch (9.5 mg total) onto the skin daily. (Patient taking differently: Place 9.5 mg onto the skin once a week. ), Disp: 30 patch, Rfl: 2 .  rosuvastatin (CRESTOR) 40 MG tablet, TAKE ONE TABLET EVERY DAY (Patient taking differently: Take 40 mg by mouth every day), Disp: 90 tablet, Rfl: 2 .  tamsulosin (FLOMAX) 0.4 MG CAPS capsule, Take 1 capsule (0.4 mg total) by mouth daily. (Patient not taking: Reported on 01/04/2017), Disp: 90 capsule, Rfl: 1 .  zolpidem (AMBIEN) 5 MG tablet, Take 5 mg by mouth at bedtime as needed for sleep. , Disp: , Rfl:   Past Medical History: Past Medical History:  Diagnosis Date  . Abnormal nuclear cardiac imaging test    Dr. [name]  . Cataract   . Coronary artery disease    Cardiac cath September 2008: 20% left main stenosis, 40% mid LAD stenosis, 99% ostial first diagonal stenosis in a large branch and 90% ostial disease in second diagonal but the vessel was very small, 20% mid RCA stenosis with normal ejection fraction. Successful angioplasty and drug-eluting stent placement to the ostial first diagonal with a 2.5 x 15 mm Xience stent  . Hyperlipidemia   . Hypertension    BP has never been high(per pt).  Metoprolol  is to keep BP low because of stent (afterload)  . Oxygen deficiency   . Pulmonary fibrosis (HCC) 08/26/2015  . Shortness of breath dyspnea    with exertion  . Sleep apnea    has not used CPAP recently.  Feels condition has improved.  . Wears hearing aid    (sometimes)    Tobacco Use: History  Smoking Status  . Never Smoker  Smokeless Tobacco  . Never Used    Labs: Recent Review Flowsheet Data    Labs for ITP Cardiac and Pulmonary Rehab Latest Ref Rng & Units 05/12/2012 07/23/2013 03/20/2015 08/26/2015 12/24/2015   Cholestrol 125 - 200 mg/dL 161 096 -  045 409   LDLCALC <130 mg/dL 84 72 - 811(B) 147   HDL >=40 mg/dL 82(N) 56(O) - 13(Y) 86(V)   Trlycerides <150 mg/dL 62 55 - 784 696   PHART 7.350 - 7.450 - - 7.44 - -   PCO2ART 32.0 - 48.0 mmHg - - 32 - -   HCO3 21.0 - 28.0 mEq/L - - 21.7 - -   ACIDBASEDEF 0.0 - 2.0 mmol/L - - 1.6 - -   O2SAT % - - 93.7 - -       Pulmonary Assessment Scores:     Pulmonary Assessment Scores    Row Name 12/20/16 1538         ADL UCSD   ADL Phase Entry     SOB Score total 48     Rest 1     Walk 2     Stairs 3     Bath 1     Dress 0     Shop 2       CAT Score   CAT Score 22        Pulmonary Function Assessment:   Exercise Target Goals:    Exercise Program Goal: Individual exercise prescription set with THRR, safety & activity barriers. Participant demonstrates ability to understand and report RPE using BORG scale, to self-measure pulse accurately, and to acknowledge the importance of the exercise prescription.  Exercise Prescription Goal: Starting with aerobic activity 30 plus minutes a day, 3 days per week for initial exercise prescription. Provide home exercise prescription and guidelines that participant acknowledges understanding prior to discharge.  Activity Barriers & Risk Stratification:     Activity Barriers & Cardiac Risk Stratification - 12/20/16 1647      Activity Barriers & Cardiac Risk Stratification   Activity Barriers Deconditioning;Muscular Weakness;Shortness of Breath;Balance Concerns;History of Falls;Assistive Device      6 Minute Walk:     6 Minute Walk    Row Name 12/20/16 1634         6 Minute Walk   Phase Initial     Distance 760 feet     Walk Time 4.4 minutes  stopped test due to desaturations     # of Rest Breaks 1  at end of test     MPH 1.96     METS 1.93     RPE 14     Perceived Dyspnea  3     VO2 Peak 6.76     Symptoms Yes (comment)     Comments SOB     Resting HR 73 bpm     Resting BP 126/64     Resting Oxygen Saturation  94 %      Exercise Oxygen Saturation  during 6 min walk 79 %     Max Ex. HR 119 bpm  Max Ex. BP 154/74     2 Minute Post BP 156/64  recheck 146/72       Interval HR   1 Minute HR 73     2 Minute HR 116     3 Minute HR 104     4 Minute HR 116     5 Minute HR 119  stopped at 4:24 due desaturation to 79%     2 Minute Post HR 80     Interval Heart Rate? Yes       Interval Oxygen   Interval Oxygen? Yes     Baseline Oxygen Saturation % 94 %     1 Minute Oxygen Saturation % 92 %     1 Minute Liters of Oxygen 4 L  pulsed     2 Minute Oxygen Saturation % 83 %     2 Minute Liters of Oxygen 4 L     3 Minute Oxygen Saturation % 81 %     3 Minute Liters of Oxygen 4 L     4 Minute Oxygen Saturation % 80 %  79% at 4:24     4 Minute Liters of Oxygen 4 L     2 Minute Post Oxygen Saturation % 92 %     2 Minute Post Liters of Oxygen 4 L       Oxygen Initial Assessment:     Oxygen Initial Assessment - 12/20/16 1540      Home Oxygen   Home Oxygen Device Portable Concentrator;Home Concentrator;E-Tanks   Sleep Oxygen Prescription CPAP   Liters per minute 2.5   Home Exercise Oxygen Prescription Continuous   Liters per minute 4   Home at Rest Exercise Oxygen Prescription Continuous   Liters per minute 2.5   Compliance with Home Oxygen Use Yes     Initial 6 min Walk   Oxygen Used Continuous   Liters per minute 4     Program Oxygen Prescription   Program Oxygen Prescription Continuous   Liters per minute 4     Intervention   Short Term Goals To learn and exhibit compliance with exercise, home and travel O2 prescription;To learn and understand importance of maintaining oxygen saturations>88%;To learn and demonstrate proper use of respiratory medications;To learn and demonstrate proper pursed lip breathing techniques or other breathing techniques.;To learn and understand importance of monitoring SPO2 with pulse oximeter and demonstrate accurate use of the pulse oximeter.   Long  Term Goals  Verbalizes importance of monitoring SPO2 with pulse oximeter and return demonstration;Exhibits proper breathing techniques, such as pursed lip breathing or other method taught during program session;Demonstrates proper use of MDI's;Compliance with respiratory medication;Maintenance of O2 saturations>88%;Exhibits compliance with exercise, home and travel O2 prescription      Oxygen Re-Evaluation:   Oxygen Discharge (Final Oxygen Re-Evaluation):   Initial Exercise Prescription:     Initial Exercise Prescription - 12/20/16 1600      Date of Initial Exercise RX and Referring Provider   Date 12/20/16   Referring Provider Freda Munro MD     Treadmill   MPH 1.5   Grade 0.5   Minutes 15   METs 2.25     NuStep   Level 1   SPM 80   Minutes 15   METs 2     REL-XR   Level 1   Speed 50   Minutes 15   METs 2     Prescription Details   Frequency (times per week) 3   Duration Progress  to 45 minutes of aerobic exercise without signs/symptoms of physical distress     Intensity   THRR 40-80% of Max Heartrate 99-125   Ratings of Perceived Exertion 11-15   Perceived Dyspnea 0-4     Progression   Progression Continue to progress workloads to maintain intensity without signs/symptoms of physical distress.     Resistance Training   Training Prescription Yes   Weight 3 lbs   Reps 10-15      Perform Capillary Blood Glucose checks as needed.  Exercise Prescription Changes:     Exercise Prescription Changes    Row Name 12/20/16 1600 12/28/16 1600           Response to Exercise   Blood Pressure (Admit) 126/64 128/54      Blood Pressure (Exercise) 154/74 130/80      Blood Pressure (Exit) 156/64  recheck 146/72 122/64      Heart Rate (Admit) 73 bpm 74 bpm      Heart Rate (Exercise) 120 bpm 92 bpm      Heart Rate (Exit) 80 bpm 72 bpm      Oxygen Saturation (Admit) 94 % 96 %      Oxygen Saturation (Exercise) 79 % 87 %      Oxygen Saturation (Exit) 92 % 99 %      Rating  of Perceived Exertion (Exercise) 14 11      Perceived Dyspnea (Exercise) 3 1      Symptoms SOB none      Comments walk test results second full day of exercise      Duration  - Progress to 45 minutes of aerobic exercise without signs/symptoms of physical distress      Intensity  - THRR unchanged        Progression   Progression  - Continue to progress workloads to maintain intensity without signs/symptoms of physical distress.      Average METs  - 2.25        Resistance Training   Training Prescription  - Yes      Weight  - 3 lbs      Reps  - 10-15        Interval Training   Interval Training  - No        Oxygen   Oxygen  - Continuous      Liters  - 3        Treadmill   MPH  - 1.5      Grade  - 0.5      Minutes  - 1      METs  - 2.25        NuStep   Level  - 2      SPM  - 100      Minutes  - 15      METs  - 1.9        REL-XR   Level  - 1      Speed  - 44      Minutes  - 15      METs  - 2.6         Exercise Comments:     Exercise Comments    Row Name 12/24/16 1135           Exercise Comments First full day of exercise!  Patient was oriented to gym and equipment including functions, settings, policies, and procedures.  Patient's individual exercise prescription and treatment plan were reviewed.  All starting workloads were established  based on the results of the 6 minute walk test done at initial orientation visit.  The plan for exercise progression was also introduced and progression will be customized based on patient's performance and goals          Exercise Goals and Review:     Exercise Goals    Row Name 12/20/16 1654             Exercise Goals   Increase Physical Activity Yes       Intervention Provide advice, education, support and counseling about physical activity/exercise needs.;Develop an individualized exercise prescription for aerobic and resistive training based on initial evaluation findings, risk stratification, comorbidities and  participant's personal goals.       Expected Outcomes Achievement of increased cardiorespiratory fitness and enhanced flexibility, muscular endurance and strength shown through measurements of functional capacity and personal statement of participant.       Increase Strength and Stamina Yes       Intervention Provide advice, education, support and counseling about physical activity/exercise needs.;Develop an individualized exercise prescription for aerobic and resistive training based on initial evaluation findings, risk stratification, comorbidities and participant's personal goals.       Expected Outcomes Achievement of increased cardiorespiratory fitness and enhanced flexibility, muscular endurance and strength shown through measurements of functional capacity and personal statement of participant.       Able to understand and use rate of perceived exertion (RPE) scale Yes       Intervention Provide education and explanation on how to use RPE scale       Expected Outcomes Short Term: Able to use RPE daily in rehab to express subjective intensity level;Long Term:  Able to use RPE to guide intensity level when exercising independently       Able to understand and use Dyspnea scale Yes       Intervention Provide education and explanation on how to use Dyspnea scale       Expected Outcomes Short Term: Able to use Dyspnea scale daily in rehab to express subjective sense of shortness of breath during exertion;Long Term: Able to use Dyspnea scale to guide intensity level when exercising independently       Knowledge and understanding of Target Heart Rate Range (THRR) Yes       Intervention Provide education and explanation of THRR including how the numbers were predicted and where they are located for reference       Expected Outcomes Short Term: Able to state/look up THRR;Long Term: Able to use THRR to govern intensity when exercising independently;Short Term: Able to use daily as guideline for intensity  in rehab       Able to check pulse independently Yes       Intervention Provide education and demonstration on how to check pulse in carotid and radial arteries.;Review the importance of being able to check your own pulse for safety during independent exercise       Expected Outcomes Long Term: Able to check pulse independently and accurately;Short Term: Able to explain why pulse checking is important during independent exercise       Understanding of Exercise Prescription Yes       Intervention Provide education, explanation, and written materials on patient's individual exercise prescription       Expected Outcomes Short Term: Able to explain program exercise prescription;Long Term: Able to explain home exercise prescription to exercise independently          Exercise Goals Re-Evaluation :  Exercise Goals Re-Evaluation    Row Name 12/28/16 1611             Exercise Goal Re-Evaluation   Exercise Goals Review Increase Strength and Stamina;Increase Physical Activity       Comments Amjad has completed two full days of exercise in rehab.  He is off to a good start.  We will continue to monitor his progression.        Expected Outcomes Short: Attend rehab classes regularly.  Long: Complete rehab in full this time.           Discharge Exercise Prescription (Final Exercise Prescription Changes):     Exercise Prescription Changes - 12/28/16 1600      Response to Exercise   Blood Pressure (Admit) 128/54   Blood Pressure (Exercise) 130/80   Blood Pressure (Exit) 122/64   Heart Rate (Admit) 74 bpm   Heart Rate (Exercise) 92 bpm   Heart Rate (Exit) 72 bpm   Oxygen Saturation (Admit) 96 %   Oxygen Saturation (Exercise) 87 %   Oxygen Saturation (Exit) 99 %   Rating of Perceived Exertion (Exercise) 11   Perceived Dyspnea (Exercise) 1   Symptoms none   Comments second full day of exercise   Duration Progress to 45 minutes of aerobic exercise without signs/symptoms of physical  distress   Intensity THRR unchanged     Progression   Progression Continue to progress workloads to maintain intensity without signs/symptoms of physical distress.   Average METs 2.25     Resistance Training   Training Prescription Yes   Weight 3 lbs   Reps 10-15     Interval Training   Interval Training No     Oxygen   Oxygen Continuous   Liters 3     Treadmill   MPH 1.5   Grade 0.5   Minutes 1   METs 2.25     NuStep   Level 2   SPM 100   Minutes 15   METs 1.9     REL-XR   Level 1   Speed 44   Minutes 15   METs 2.6      Nutrition:  Target Goals: Understanding of nutrition guidelines, daily intake of sodium 1500mg , cholesterol 200mg , calories 30% from fat and 7% or less from saturated fats, daily to have 5 or more servings of fruits and vegetables.  Biometrics:     Pre Biometrics - 12/20/16 1657      Pre Biometrics   Height 5' 6.5" (1.689 m)   Weight 156 lb 6.4 oz (70.9 kg)   Waist Circumference 36.5 inches   Hip Circumference 37 inches   Waist to Hip Ratio 0.99 %   BMI (Calculated) 24.87       Nutrition Therapy Plan and Nutrition Goals:     Nutrition Therapy & Goals - 12/20/16 1533      Nutrition Therapy   RD appointment defered Yes     Intervention Plan   Intervention Prescribe, educate and counsel regarding individualized specific dietary modifications aiming towards targeted core components such as weight, hypertension, lipid management, diabetes, heart failure and other comorbidities.;Nutrition handout(s) given to patient.   Expected Outcomes Short Term Goal: Understand basic principles of dietary content, such as calories, fat, sodium, cholesterol and nutrients.;Short Term Goal: A plan has been developed with personal nutrition goals set during dietitian appointment.;Long Term Goal: Adherence to prescribed nutrition plan.      Nutrition Discharge: Rate Your Plate Scores:     Nutrition  Assessments - 12/20/16 1534      MEDFICTS Scores    Pre Score 18      Nutrition Goals Re-Evaluation:   Nutrition Goals Discharge (Final Nutrition Goals Re-Evaluation):   Psychosocial: Target Goals: Acknowledge presence or absence of significant depression and/or stress, maximize coping skills, provide positive support system. Participant is able to verbalize types and ability to use techniques and skills needed for reducing stress and depression.   Initial Review & Psychosocial Screening:     Initial Psych Review & Screening - 12/20/16 1530      Initial Review   Current issues with Current Depression;Current Stress Concerns   Source of Stress Concerns Chronic Illness;Family   Comments Feels like he cant take of the family as he once did.     Family Dynamics   Good Support System? Yes     Barriers   Psychosocial barriers to participate in program The patient should benefit from training in stress management and relaxation.     Screening Interventions   Interventions Program counselor consult;Encouraged to exercise;Yes;Provide feedback about the scores to participant   Expected Outcomes Short Term goal: Utilizing psychosocial counselor, staff and physician to assist with identification of specific Stressors or current issues interfering with healing process. Setting desired goal for each stressor or current issue identified.;Long Term Goal: Stressors or current issues are controlled or eliminated.;Short Term goal: Identification and review with participant of any Quality of Life or Depression concerns found by scoring the questionnaire.;Long Term goal: The participant improves quality of Life and PHQ9 Scores as seen by post scores and/or verbalization of changes      Quality of Life Scores:   PHQ-9: Recent Review Flowsheet Data    Depression screen [facility] 2/9 12/20/2016 04/26/2016 12/24/2015 08/26/2015 04/22/2015   Decreased Interest 2 0 0 0 0   Down, Depressed, Hopeless 1 0 0 0 0   PHQ - 2 Score 3 0 0 0 0   Altered sleeping 0 - - -  0   Tired, decreased energy 1 - - - -   Change in appetite 1 - - - 0   Feeling bad or failure about yourself  1 - - - 0   Trouble concentrating 0 - - - 0   Moving slowly or fidgety/restless 0 - - - 3   Suicidal thoughts 0 - - - 0   PHQ-9 Score 6 - - - 3   Difficult doing work/chores Somewhat difficult - - - Somewhat difficult     Interpretation of Total Score  Total Score Depression Severity:  1-4 = Minimal depression, 5-9 = Mild depression, 10-14 = Moderate depression, 15-19 = Moderately severe depression, 20-27 = Severe depression   Psychosocial Evaluation and Intervention:   Psychosocial Re-Evaluation:   Psychosocial Discharge (Final Psychosocial Re-Evaluation):   Education: Education Goals: Education classes will be provided on a weekly basis, covering required topics. Participant will state understanding/return demonstration of topics presented.  Learning Barriers/Preferences:     Learning Barriers/Preferences - 12/20/16 1554      Learning Barriers/Preferences   Learning Barriers None   Learning Preferences None      Education Topics: Initial Evaluation Education: - Verbal, written and demonstration of respiratory meds, RPE/PD scales, oximetry and breathing techniques. Instruction on use of nebulizers and MDIs: cleaning and proper use, rinsing mouth with steroid doses and importance of monitoring MDI activations.   Pulmonary Rehab from 01/05/2017 in [facility] Cardiac and Pulmonary Rehab  Date  12/20/16  Educator  [facility]  Instruction Review Code  1- Verbalizes Understanding      General Nutrition Guidelines/Fats and Fiber: -Group instruction provided by verbal, written material, models and posters to present the general guidelines for heart healthy nutrition. Gives an explanation and review of dietary fats and fiber.   Pulmonary Rehab from 01/05/2017 in [facility] Cardiac and Pulmonary Rehab  Date  01/03/17  Educator  CR  Instruction Review Code  1- Verbalizes Understanding       Controlling Sodium/Reading Food Labels: -Group verbal and written material supporting the discussion of sodium use in heart healthy nutrition. Review and explanation with models, verbal and written materials for utilization of the food label.   Pulmonary Rehab from 07/28/2015 in [facility] Cardiac and Pulmonary Rehab  Date  05/12/15  Educator  CR  Instruction Review Code (retired)  2- meets goals/outcomes      Exercise Physiology & Risk Factors: - Group verbal and written instruction with models to review the exercise physiology of the cardiovascular system and associated critical values. Details cardiovascular disease risk factors and the goals associated with each risk factor.   Pulmonary Rehab from 01/05/2017 in [facility] Cardiac and Pulmonary Rehab  Date  12/24/16  Educator  Roney Jaffe, EP  Instruction Review Code  2- Demonstrated Understanding      Aerobic Exercise & Resistance Training: - Gives group verbal and written discussion on the health impact of inactivity. On the components of aerobic and resistive training programs and the benefits of this training and how to safely progress through these programs.   Flexibility, Balance, General Exercise Guidelines: - Provides group verbal and written instruction on the benefits of flexibility and balance training programs. Provides general exercise guidelines with specific guidelines to those with heart or lung disease. Demonstration and skill practice provided.   Pulmonary Rehab from 07/28/2015 in [facility] Cardiac and Pulmonary Rehab  Date  05/07/15  Educator  RM  Instruction Review Code (retired)  2- meets goals/outcomes      Stress Management: - Provides group verbal and written instruction about the health risks of elevated stress, cause of high stress, and healthy ways to reduce stress.   Pulmonary Rehab from 07/28/2015 in [facility] Cardiac and Pulmonary Rehab  Date  05/14/15  Educator  [facility]  Instruction Review Code (retired)  2- meets  goals/outcomes      Depression: - Provides group verbal and written instruction on the correlation between heart/lung disease and depressed mood, treatment options, and the stigmas associated with seeking treatment.   Exercise & Equipment Safety: - Individual verbal instruction and demonstration of equipment use and safety with use of the equipment.   Pulmonary Rehab from 01/05/2017 in [facility] Cardiac and Pulmonary Rehab  Date  12/20/16  Educator  [facility]  Instruction Review Code  1- Verbalizes Understanding      Infection Prevention: - Provides verbal and written material to individual with discussion of infection control including proper hand washing and proper equipment cleaning during exercise session.   Pulmonary Rehab from 01/05/2017 in [facility] Cardiac and Pulmonary Rehab  Date  12/20/16  Educator  [facility]  Instruction Review Code  1- Verbalizes Understanding      Falls Prevention: - Provides verbal and written material to individual with discussion of falls prevention and safety.   Pulmonary Rehab from 01/05/2017 in [facility] Cardiac and Pulmonary Rehab  Date  12/20/16  Educator  [facility]  Instruction Review Code  1- Verbalizes Understanding      Diabetes: - Individual verbal and written instruction to review signs/symptoms  of diabetes, desired ranges of glucose level fasting, after meals and with exercise. Advice that pre and post exercise glucose checks will be done for 3 sessions at entry of program.   Chronic Lung Diseases: - Group verbal and written instruction to review new updates, new respiratory medications, new advancements in procedures and treatments. Provide informative websites and "800" numbers of self-education.   Pulmonary Rehab from 07/28/2015 in [facility] Cardiac and Pulmonary Rehab  Date  07/28/15  Educator  [facility]  Instruction Review Code (retired)  2- meets goals/outcomes      Lung Procedures: - Group verbal and written instruction to describe testing methods done to diagnose  lung disease. Review the outcome of test results. Describe the treatment choices: Pulmonary Function Tests, ABGs and oximetry.   Pulmonary Rehab from 07/28/2015 in [facility] Cardiac and Pulmonary Rehab  Date  05/02/15  Educator  [facility]  Instruction Review Code (retired)  2- meets Engineer, maintenance: - Provide group verbal and written instruction for methods to conserve energy, plan and organize activities. Instruct on pacing techniques, use of adaptive equipment and posture/positioning to relieve shortness of breath.   Triggers: - Group verbal and written instruction to review types of environmental controls: home humidity, furnaces, filters, dust mite/pet prevention, HEPA vacuums. To discuss weather changes, air quality and the benefits of nasal washing.   Exacerbations: - Group verbal and written instruction to provide: warning signs, infection symptoms, calling MD promptly, preventive modes, and value of vaccinations. Review: effective airway clearance, coughing and/or vibration techniques. Create an Sport and exercise psychologist.   Oxygen: - Individual and group verbal and written instruction on oxygen therapy. Includes supplement oxygen, available portable oxygen systems, continuous and intermittent flow rates, oxygen safety, concentrators, and Medicare reimbursement for oxygen.   Pulmonary Rehab from 01/05/2017 in [facility] Cardiac and Pulmonary Rehab  Date  12/20/16  Educator  [facility]  Instruction Review Code  1- Verbalizes Understanding      Respiratory Medications: - Group verbal and written instruction to review medications for lung disease. Drug class, frequency, complications, importance of spacers, rinsing mouth after steroid MDI's, and proper cleaning methods for nebulizers.   Pulmonary Rehab from 01/05/2017 in [facility] Cardiac and Pulmonary Rehab  Date  12/20/16  Educator  [facility]  Instruction Review Code  1- Verbalizes Understanding      AED/CPR: - Group verbal and written instruction  with the use of models to demonstrate the basic use of the AED with the basic ABC's of resuscitation.   Pulmonary Rehab from 07/28/2015 in [facility] Cardiac and Pulmonary Rehab  Date  05/23/15  Educator  CE  Instruction Review Code (retired)  2- meets goals/outcomes      Breathing Retraining: - Provides individuals verbal and written instruction on purpose, frequency, and proper technique of diaphragmatic breathing and pursed-lipped breathing. Applies individual practice skills.   Pulmonary Rehab from 01/05/2017 in [facility] Cardiac and Pulmonary Rehab  Date  12/20/16  Educator  [facility]  Instruction Review Code  1- Verbalizes Understanding      Anatomy and Physiology of the Lungs: - Group verbal and written instruction with the use of models to provide basic lung anatomy and physiology related to function, structure and complications of lung disease.   Anatomy & Physiology of the Heart: - Group verbal and written instruction and models provide basic cardiac anatomy and physiology, with the coronary electrical and arterial systems. Review of: AMI, Angina, Valve disease, Heart Failure, Cardiac Arrhythmia, Pacemakers, and the ICD.   Heart  Failure: - Group verbal and written instruction on the basics of heart failure: signs/symptoms, treatments, explanation of ejection fraction, enlarged heart and cardiomyopathy.   Sleep Apnea: - Individual verbal and written instruction to review Obstructive Sleep Apnea. Review of risk factors, methods for diagnosing and types of masks and machines for OSA.   Pulmonary Rehab from 01/05/2017 in [facility] Cardiac and Pulmonary Rehab  Date  12/20/16  Educator  [facility]  Instruction Review Code  1- Verbalizes Understanding      Anxiety: - Provides group, verbal and written instruction on the correlation between heart/lung disease and anxiety, treatment options, and management of anxiety.   Relaxation: - Provides group, verbal and written instruction about the benefits of  relaxation for patients with heart/lung disease. Also provides patients with examples of relaxation techniques.   Pulmonary Rehab from 01/05/2017 in [facility] Cardiac and Pulmonary Rehab  Date  01/05/17  Educator  [facility]  Instruction Review Code  1- Verbalizes Understanding      Cardiac Medications: - Group verbal and written instruction to review commonly prescribed medications for heart disease. Reviews the medication, class of the drug, and side effects.   Pulmonary Rehab from 07/28/2015 in [facility] Cardiac and Pulmonary Rehab  Date  04/25/15  Educator  CE  Instruction Review Code (retired)  2- meets goals/outcomes      Know Your Numbers: -Group verbal and written instruction about important numbers in your health.  Review of Cholesterol, Blood Pressure, Diabetes, and BMI and the role they play in your overall health.   Pulmonary Rehab from 01/05/2017 in [facility] Cardiac and Pulmonary Rehab  Date  12/31/16  Educator  [facility]  Instruction Review Code  1- Verbalizes Understanding      Other: -Provides group and verbal instruction on various topics (see comments)    Knowledge Questionnaire Score:     Knowledge Questionnaire Score - 12/20/16 1538      Knowledge Questionnaire Score   Pre Score 9/10       Core Components/Risk Factors/Patient Goals at Admission:     Personal Goals and Risk Factors at Admission - 12/20/16 1550      Core Components/Risk Factors/Patient Goals on Admission    Weight Management Yes;Weight Maintenance   Intervention Weight Management: Develop a combined nutrition and exercise program designed to reach desired caloric intake, while maintaining appropriate intake of nutrient and fiber, sodium and fats, and appropriate energy expenditure required for the weight goal.;Weight Management: Provide education and appropriate resources to help participant work on and attain dietary goals.;Weight Management/Obesity: Establish reasonable short term and long term weight goals.    Admit Weight 156 lb (70.8 kg)   Goal Weight: Short Term 151 lb (68.5 kg)   Goal Weight: Long Term 151 lb (68.5 kg)   Expected Outcomes Short Term: Continue to assess and modify interventions until short term weight is achieved;Long Term: Adherence to nutrition and physical activity/exercise program aimed toward attainment of established weight goal;Weight Maintenance: Understanding of the daily nutrition guidelines, which includes 25-35% calories from fat, 7% or less cal from saturated fats, less than 200mg  cholesterol, less than 1.5gm of sodium, & 5 or more servings of fruits and vegetables daily;Understanding recommendations for meals to include 15-35% energy as protein, 25-35% energy from fat, 35-60% energy from carbohydrates, less than 200mg  of dietary cholesterol, 20-35 gm of total fiber daily;Understanding of distribution of calorie intake throughout the day with the consumption of 4-5 meals/snacks   Lipids Yes   Intervention Provide education and support for participant on  nutrition & aerobic/resistive exercise along with prescribed medications to achieve LDL 70mg , HDL >40mg .   Expected Outcomes Short Term: Participant states understanding of desired cholesterol values and is compliant with medications prescribed. Participant is following exercise prescription and nutrition guidelines.;Long Term: Cholesterol controlled with medications as prescribed, with individualized exercise RX and with personalized nutrition plan. Value goals: LDL < 70mg , HDL > 40 mg.   Stress Yes   Intervention Offer individual and/or small group education and counseling on adjustment to heart disease, stress management and health-related lifestyle change. Teach and support self-help strategies.;Refer participants experiencing significant psychosocial distress to appropriate mental health specialists for further evaluation and treatment. When possible, include family members and significant others in education/counseling  sessions.   Expected Outcomes Short Term: Participant demonstrates changes in health-related behavior, relaxation and other stress management skills, ability to obtain effective social support, and compliance with psychotropic medications if prescribed.;Long Term: Emotional wellbeing is indicated by absence of clinically significant psychosocial distress or social isolation.      Core Components/Risk Factors/Patient Goals Review:    Core Components/Risk Factors/Patient Goals at Discharge (Final Review):    ITP Comments:     ITP Comments    Row Name 12/20/16 1638 01/10/17 0830         ITP Comments Medical Evaluation completed. Chart sent to Dr. Bethann PunchesMark Miller Director of [facility]. Medical Diagnosis can be found in [facility] encounter 12/20/16 30 day review completed. ITP sent to Dr. Bethann PunchesMark Miller Director of [name]. Continue with ITP unless changes are made by physician.           Comments: 30 day review

## 2017-01-10 NOTE — Progress Notes (Signed)
Daily Session Note  Patient Details  Name: Vincent Kennedy MRN: [id number] Date of Birth: [birth year] Referring Provider:     Pulmonary Rehab from 12/20/2016 in [facility] Cardiac and Pulmonary Rehab  Referring Provider  Devona Konig MD      Encounter Date: 01/10/2017  Check In:     Session Check In - 01/10/17 1140      Check-In   Location [facility]-Cardiac & Pulmonary Rehab   Staff Present Nada Maclachlan, BA, ACSM CEP, Exercise Physiologist;Kelly Amedeo Plenty, BS, ACSM CEP, Exercise Physiologist;Courvoisier Hamblen Flavia Shipper   Supervising physician immediately available to respond to emergencies [name] immediately available ER MD   Physician(s) Dr. Kerman Passey and Mariea Clonts   Medication changes reported     No   Fall or balance concerns reported    No   Warm-up and Cool-down Performed as group-led instruction   Resistance Training Performed Yes   VAD Patient? No     Pain Assessment   Currently in Pain? No/denies   Multiple Pain Sites No         History  Smoking Status  . Never Smoker  Smokeless Tobacco  . Never Used    Goals Met:  Exercise tolerated well No report of cardiac concerns or symptoms Strength training completed today  Goals Unmet:  Not Applicable  Comments: Pt able to follow exercise prescription today without complaint.  Will continue to monitor for progression.   Dr. Emily Filbert is Medical Director for [facility] and [name].

## 2017-01-12 ENCOUNTER — Encounter (HOSPITAL_COMMUNITY): Payer: Self-pay | Admitting: Cardiovascular Disease

## 2017-01-12 ENCOUNTER — Encounter (HOSPITAL_COMMUNITY)
Admission: RE | Disposition: A | Discharge status: Home / Self Care | Payer: Self-pay | Source: Ambulatory Visit | Attending: Cardiovascular Disease

## 2017-01-12 ENCOUNTER — Ambulatory Visit (HOSPITAL_COMMUNITY)
Admission: RE | Admit: 2017-01-12 | Discharge: 2017-01-12 | Disposition: A | Discharge status: Home / Self Care | Payer: Medicare Other | Source: Ambulatory Visit | Attending: Cardiovascular Disease | Admitting: Cardiovascular Disease

## 2017-01-12 DIAGNOSIS — Z955 Presence of coronary angioplasty implant and graft: Secondary | ICD-10-CM | POA: Diagnosis not present

## 2017-01-12 DIAGNOSIS — Z9981 Dependence on supplemental oxygen: Secondary | ICD-10-CM | POA: Insufficient documentation

## 2017-01-12 DIAGNOSIS — I272 Pulmonary hypertension, unspecified: Secondary | ICD-10-CM | POA: Diagnosis not present

## 2017-01-12 DIAGNOSIS — R0609 Other forms of dyspnea: Secondary | ICD-10-CM

## 2017-01-12 DIAGNOSIS — I25118 Atherosclerotic heart disease of native coronary artery with other forms of angina pectoris: Secondary | ICD-10-CM | POA: Diagnosis not present

## 2017-01-12 DIAGNOSIS — Z8249 Family history of ischemic heart disease and other diseases of the circulatory system: Secondary | ICD-10-CM | POA: Insufficient documentation

## 2017-01-12 DIAGNOSIS — I10 Essential (primary) hypertension: Secondary | ICD-10-CM | POA: Insufficient documentation

## 2017-01-12 DIAGNOSIS — Z7951 Long term (current) use of inhaled steroids: Secondary | ICD-10-CM | POA: Diagnosis not present

## 2017-01-12 DIAGNOSIS — Z7982 Long term (current) use of aspirin: Secondary | ICD-10-CM | POA: Insufficient documentation

## 2017-01-12 DIAGNOSIS — I2510 Atherosclerotic heart disease of native coronary artery without angina pectoris: Secondary | ICD-10-CM

## 2017-01-12 DIAGNOSIS — G4730 Sleep apnea, unspecified: Secondary | ICD-10-CM | POA: Diagnosis not present

## 2017-01-12 DIAGNOSIS — J8410 Pulmonary fibrosis, unspecified: Secondary | ICD-10-CM | POA: Diagnosis not present

## 2017-01-12 DIAGNOSIS — E785 Hyperlipidemia, unspecified: Secondary | ICD-10-CM | POA: Diagnosis not present

## 2017-01-12 DIAGNOSIS — I2584 Coronary atherosclerosis due to calcified coronary lesion: Secondary | ICD-10-CM | POA: Insufficient documentation

## 2017-01-12 HISTORY — PX: RIGHT/LEFT HEART CATH AND CORONARY ANGIOGRAPHY: CATH118266

## 2017-01-12 LAB — POCT I-STAT 3, VENOUS BLOOD GAS (G3P V)
ACID-BASE DEFICIT: 1 mmol/L (ref 0.0–2.0)
ACID-BASE DEFICIT: 2 mmol/L (ref 0.0–2.0)
Acid-base deficit: 1 mmol/L (ref 0.0–2.0)
Acid-base deficit: 2 mmol/L (ref 0.0–2.0)
BICARBONATE: 23.9 mmol/L (ref 20.0–28.0)
Bicarbonate: 24.1 mmol/L (ref 20.0–28.0)
Bicarbonate: 23.8 mmol/L (ref 20.0–28.0)
Bicarbonate: 25.3 mmol/L (ref 20.0–28.0)
O2 SAT: 64 %
O2 SAT: 76 %
O2 Saturation: 77 %
O2 Saturation: 78 %
PCO2 VEN: 42.2 mmHg — AB (ref 44.0–60.0)
PCO2 VEN: 46.4 mmHg (ref 44.0–60.0)
PH VEN: 7.342 (ref 7.250–7.430)
PH VEN: 7.359 (ref 7.250–7.430)
PO2 VEN: 43 mmHg (ref 32.0–45.0)
PO2 VEN: 45 mmHg (ref 32.0–45.0)
TCO2: 25 mmol/L (ref 22–32)
TCO2: 27 mmol/L (ref 22–32)
TCO2: 25 mmol/L (ref 22–32)
TCO2: 25 mmol/L (ref 22–32)
pCO2, Ven: 43 mmHg — ABNORMAL LOW (ref 44.0–60.0)
pCO2, Ven: 44.2 mmHg (ref 44.0–60.0)
pH, Ven: 7.357 (ref 7.250–7.430)
pH, Ven: 7.344 (ref 7.250–7.430)
pO2, Ven: 42 mmHg (ref 32.0–45.0)
pO2, Ven: 35 mmHg (ref 32.0–45.0)

## 2017-01-12 LAB — POCT I-STAT 3, ART BLOOD GAS (G3+)
ACID-BASE DEFICIT: 1 mmol/L (ref 0.0–2.0)
BICARBONATE: 24.3 mmol/L (ref 20.0–28.0)
O2 Saturation: 98 %
PH ART: 7.378 (ref 7.350–7.450)
TCO2: 26 mmol/L (ref 22–32)
pCO2 arterial: 41.3 mmHg (ref 32.0–48.0)
pO2, Arterial: 102 mmHg (ref 83.0–108.0)

## 2017-01-12 SURGERY — RIGHT/LEFT HEART CATH AND CORONARY ANGIOGRAPHY
Anesthesia: LOCAL

## 2017-01-12 MED ORDER — NITROGLYCERIN 1 MG/10 ML FOR IR/CATH LAB
INTRA_ARTERIAL | Status: AC
  Filled 2017-01-12: qty 10

## 2017-01-12 MED ORDER — NITROGLYCERIN 1 MG/10 ML FOR IR/CATH LAB
INTRA_ARTERIAL | Status: DC | PRN
  Administered 2017-01-12: 200 ug via INTRACORONARY

## 2017-01-12 MED ORDER — HEPARIN SODIUM (PORCINE) 1000 UNIT/ML IJ SOLN
INTRAMUSCULAR | Status: AC
  Filled 2017-01-12: qty 1

## 2017-01-12 MED ORDER — LIDOCAINE HCL 2 % IJ SOLN
INTRAMUSCULAR | Status: AC
  Filled 2017-01-12: qty 20

## 2017-01-12 MED ORDER — ASPIRIN EC 81 MG PO TBEC: 81.0000 mg | DELAYED_RELEASE_TABLET | Freq: Every day | ORAL | 6 refills | Status: AC

## 2017-01-12 MED ORDER — CLOPIDOGREL BISULFATE 75 MG PO TABS: 75.0000 mg | ORAL_TABLET | Freq: Every day | ORAL | 6 refills | Status: DC

## 2017-01-12 MED ORDER — HEPARIN (PORCINE) IN NACL 2-0.9 UNIT/ML-% IJ SOLN
INTRAMUSCULAR | Status: AC | PRN
  Administered 2017-01-12: 1000 mL

## 2017-01-12 MED ORDER — IOPAMIDOL (ISOVUE-370) INJECTION 76%
INTRAVENOUS | Status: AC
  Filled 2017-01-12: qty 100

## 2017-01-12 MED ORDER — LIDOCAINE HCL 2 % IJ SOLN
INTRAMUSCULAR | Status: DC | PRN
  Administered 2017-01-12: 5 mL

## 2017-01-12 MED ORDER — FENTANYL CITRATE (PF) 100 MCG/2ML IJ SOLN
INTRAMUSCULAR | Status: DC | PRN
  Administered 2017-01-12: 25 ug via INTRAVENOUS

## 2017-01-12 MED ORDER — VERAPAMIL HCL 2.5 MG/ML IV SOLN
INTRAVENOUS | Status: AC
  Filled 2017-01-12: qty 2

## 2017-01-12 MED ORDER — SODIUM CHLORIDE 0.9 % IV SOLN: INTRAVENOUS | Status: DC

## 2017-01-12 MED ORDER — SODIUM CHLORIDE 0.9% FLUSH: 3.0000 mL | Freq: Two times a day (BID) | INTRAVENOUS | Status: DC

## 2017-01-12 MED ORDER — SODIUM CHLORIDE 0.9 % IV SOLN: 250.0000 mL | INTRAVENOUS | Status: DC | PRN

## 2017-01-12 MED ORDER — MIDAZOLAM HCL 2 MG/2ML IJ SOLN
INTRAMUSCULAR | Status: DC | PRN
  Administered 2017-01-12: 1 mg via INTRAVENOUS

## 2017-01-12 MED ORDER — SODIUM CHLORIDE 0.9% FLUSH: 3.0000 mL | INTRAVENOUS | Status: DC | PRN

## 2017-01-12 MED ORDER — FENTANYL CITRATE (PF) 100 MCG/2ML IJ SOLN
INTRAMUSCULAR | Status: AC
  Filled 2017-01-12: qty 2

## 2017-01-12 MED ORDER — ASPIRIN 81 MG PO CHEW
CHEWABLE_TABLET | ORAL | Status: AC
  Administered 2017-01-12: 324 mg via ORAL
  Filled 2017-01-12: qty 4

## 2017-01-12 MED ORDER — HEPARIN (PORCINE) IN NACL 2-0.9 UNIT/ML-% IJ SOLN
INTRAMUSCULAR | Status: DC | PRN
  Administered 2017-01-12: 10 mL via INTRA_ARTERIAL

## 2017-01-12 MED ORDER — HEPARIN (PORCINE) IN NACL 2-0.9 UNIT/ML-% IJ SOLN
INTRAMUSCULAR | Status: AC
  Filled 2017-01-12: qty 1000

## 2017-01-12 MED ORDER — MIDAZOLAM HCL 2 MG/2ML IJ SOLN
INTRAMUSCULAR | Status: AC
  Filled 2017-01-12: qty 2

## 2017-01-12 MED ORDER — ASPIRIN 81 MG PO CHEW
324.0000 mg | CHEWABLE_TABLET | ORAL | Status: AC
  Administered 2017-01-12: 324 mg via ORAL

## 2017-01-12 MED ORDER — HEPARIN SODIUM (PORCINE) 1000 UNIT/ML IJ SOLN
INTRAMUSCULAR | Status: DC | PRN
  Administered 2017-01-12: 3500 [IU] via INTRAVENOUS

## 2017-01-12 MED ORDER — SODIUM CHLORIDE 0.9 % WEIGHT BASED INFUSION
3.0000 mL/kg/h | INTRAVENOUS | Status: AC
  Administered 2017-01-12: 3 mL/kg/h via INTRAVENOUS

## 2017-01-12 MED ORDER — IOPAMIDOL (ISOVUE-370) INJECTION 76%
INTRAVENOUS | Status: DC | PRN
  Administered 2017-01-12: 75 mL via INTRA_ARTERIAL

## 2017-01-12 MED ORDER — SODIUM CHLORIDE 0.9 % WEIGHT BASED INFUSION: 1.0000 mL/kg/h | INTRAVENOUS | Status: DC

## 2017-01-12 SURGICAL SUPPLY — 16 items
CATH BALLN WEDGE 5F 110CM (CATHETERS) ×2 IMPLANT
CATH INFINITI 5 FR JL3.5 (CATHETERS) ×2 IMPLANT
CATH INFINITI 5FR ANG PIGTAIL (CATHETERS) ×2 IMPLANT
CATH INFINITI JR4 5F (CATHETERS) ×2 IMPLANT
CATH OPTITORQUE JACKY 4.0 5F (CATHETERS) ×2 IMPLANT
DEVICE RAD COMP TR BAND LRG (VASCULAR PRODUCTS) ×2 IMPLANT
GLIDESHEATH SLEND SS 6F .021 (SHEATH) ×2 IMPLANT
GUIDEWIRE INQWIRE 1.5J.035X260 (WIRE) ×1 IMPLANT
INQWIRE 1.5J .035X260CM (WIRE) ×2
KIT HEART LEFT (KITS) ×2 IMPLANT
PACK CARDIAC CATHETERIZATION (CUSTOM PROCEDURE TRAY) ×2 IMPLANT
SHEATH GLIDE SLENDER 4/5FR (SHEATH) ×2 IMPLANT
SYR MEDRAD MARK V 150ML (SYRINGE) ×2 IMPLANT
TRANSDUCER W/STOPCOCK (MISCELLANEOUS) ×2 IMPLANT
TUBING CIL FLEX 10 FLL-RA (TUBING) ×2 IMPLANT
WIRE EMERALD 3MM-J .025X260CM (WIRE) ×2 IMPLANT

## 2017-01-12 NOTE — H&P (Signed)
Cardiology Office Note   Date:  09/16/2016   ID:  Hitoshi A Erway, DOB 06/30/1932, MRN [id number]  PCP:  Khan, Saadat A, MD     Cardiologist:   Kaylinn Dedic, MD       Chief Complaint  Patient presents with  . other    F/u echo. Meds reviewed verbally with pt.      History of Present Illness: Vincent Kennedy is a 84 y.o. male who Is here today for follow-up visit regarding coronary artery disease. He is a retired general surgeon . In 2008 when he presented with unstable angina. Cardiac catheterization showed 99% ostial first diagonal stenosis as well as mild left main and LAD disease. He underwent successful angioplasty and drug-eluting stent placement to the ostial first diagonal. He has not had any cardiac events since then. Most recent nuclear stress test in March 2016 was normal.  He was diagnosed with pulmonary fibrosis in 2016. He is on continuous oxygen therapy.  He was hospitalized recently at [facility] with symptoms of pneumonia. CT scan of the chest showed evidence of right sided pneumonia with coronary calcifications including left main and dilated pulmonary artery suggestive of pulmonary hypertension. He improved with antibiotics. He had urinary retention which required Foley catheter placement. He had an echocardiogram done while hospitalized which showed normal LV systolic function with an EF of 55-60%, grade 1 diastolic dysfunction and mild pulmonary hypertension with peak systolic pressure 42 mmHg.  He has been doing reasonably well and reports that she is almost 90% back to his baseline. No chest pain.      Past Medical History:  Diagnosis Date  . Abnormal nuclear cardiac imaging test    Dr. Lateshia Schmoker  . Cataract   . Coronary artery disease    Cardiac cath September 2008: 20% left main stenosis, 40% mid LAD stenosis, 99% ostial first diagonal stenosis in a large branch and 90% ostial disease in second diagonal but the vessel was very small, 20% mid RCA  stenosis with normal ejection fraction. Successful angioplasty and drug-eluting stent placement to the ostial first diagonal with a 2.5 x 15 mm Xience stent  . Hyperlipidemia   . Hypertension    BP has never been high(per pt).  Metoprolol is to keep BP low because of stent (afterload)  . Oxygen deficiency   . Pulmonary fibrosis (HCC) 08/26/2015  . Shortness of breath dyspnea    with exertion  . Sleep apnea    has not used CPAP recently.  Feels condition has improved.  . Wears hearing aid    (sometimes)         Past Surgical History:  Procedure Laterality Date  . CARDIAC CATHETERIZATION     2010  . CATARACT EXTRACTION W/PHACO Right 01/29/2015   Procedure: CATARACT EXTRACTION PHACO AND INTRAOCULAR LENS PLACEMENT (IOC);  Surgeon: Chadwick Brasington, MD;  Location: [facility];  Service: Ophthalmology;  Laterality: Right;  RESTOR LENS CPAP  . CATARACT EXTRACTION W/PHACO Left 03/12/2015   Procedure: CATARACT EXTRACTION PHACO AND INTRAOCULAR LENS PLACEMENT (IOC);  Surgeon: Chadwick Brasington, MD;  Location: [facility];  Service: Ophthalmology;  Laterality: Left;  RESTOR SHUGARCAINE  . COLONOSCOPY WITH ESOPHAGOGASTRODUODENOSCOPY (EGD)    . CORONARY ANGIOPLASTY  2010  . EYE SURGERY     cataract  . HERNIA REPAIR Bilateral    inguinal           Current Outpatient Prescriptions  Medication Sig Dispense Refill  . albuterol (PROVENTIL HFA;VENTOLIN HFA) 108 (90 Base)   MCG/ACT inhaler Inhale 2 puffs into the lungs every 6 (six) hours as needed for wheezing or shortness of breath. 1 Inhaler 2  . aspirin EC 325 MG tablet Take 1 tablet (325 mg total) by mouth daily. 30 tablet 0  . budesonide (PULMICORT) 0.5 MG/2ML nebulizer solution Take 2 mLs (0.5 mg total) by nebulization 2 (two) times daily. 2 mL 12  . finasteride (PROSCAR) 5 MG tablet Take 1 tablet (5 mg total) by mouth daily. 90 tablet 1  . ipratropium-albuterol (DUONEB) 0.5-2.5 (3) MG/3ML SOLN  Take 3 mLs by nebulization every 6 (six) hours. 360 mL 2  . mirtazapine (REMERON) 15 MG tablet Take 1 tablet (15 mg total) by mouth at bedtime. 30 tablet 11  . mometasone-formoterol (DULERA) 100-5 MCG/ACT AERO Inhale 2 puffs into the lungs 2 (two) times daily. 1 Inhaler 1  . pantoprazole (PROTONIX) 40 MG tablet Take 1 tablet (40 mg total) by mouth daily as needed. Caution:prolonged use may increase risk of pneumonia, colitis, osteoporosis, anemia (Patient taking differently: Take 40 mg by mouth daily. Caution:prolonged use may increase risk of pneumonia, colitis, osteoporosis, anemi) 30 tablet 1  . Pirfenidone (ESBRIET) 267 MG CAPS Take 3 capsules by mouth 3 (three) times daily.     . rivastigmine (EXELON) 9.5 mg/24hr Place 1 patch (9.5 mg total) onto the skin daily. 30 patch 2  . rosuvastatin (CRESTOR) 40 MG tablet Take 1 tablet (40 mg total) by mouth daily. 90 tablet 1  . tamsulosin (FLOMAX) 0.4 MG CAPS capsule Take 1 capsule (0.4 mg total) by mouth daily. 90 capsule 1  . zolpidem (AMBIEN) 5 MG tablet Take 5 mg by mouth at bedtime as needed.    . carvedilol (COREG) 12.5 MG tablet Take 0.5 tablets (6.25 mg total) by mouth 2 (two) times daily. 90 tablet 1   No current facility-administered medications for this visit.     Allergies:   Patient has no known allergies.    Social History:  The patient  reports that he has never smoked. He has never used smokeless tobacco. He reports that he does not drink alcohol or use drugs.   Family History:  The patient's family history includes CAD in his brother and father.    ROS:  Please see the history of present illness.   Otherwise, review of systems are positive for none.   All other systems are reviewed and negative.    PHYSICAL EXAM: VS:  BP 108/60 (BP Location: Left Arm, Patient Position: Sitting, Cuff Size: Normal)   Pulse 80   Ht 5' 8" (1.727 m)   Wt 156 lb (70.8 kg)   BMI 23.72 kg/m  , BMI Body mass index is 23.72  kg/m. GEN: Well nourished, well developed, in no acute distress  HEENT: normal  Neck: no JVD, carotid bruits, or masses Cardiac: RRR; no murmurs, rubs, or gallops,no edema  Respiratory:  Diminished breath sounds bilaterally with fine crackles, bronchial breath sounds at the right base, normal work of breathing GI: soft, nontender, nondistended, + BS MS: no deformity or atrophy  Skin: warm and dry, no rash Neuro:  Strength and sensation are intact Psych: euthymic mood, full affect   EKG:  EKG is ordered today. The ekg ordered today demonstrates sinus rhythm with sinus arrhythmia   Recent Labs: 08/15/2016: ALT 26; B Natriuretic Peptide 98.0 08/17/2016: BUN 19; Creatinine, Ser 0.71; Hemoglobin 12.5; Platelets 241; Potassium 4.0; Sodium 138    Lipid Panel Labs(Brief)            Component Value Date/Time   CHOL 149 12/24/2015 1105   CHOL 187 08/26/2015 0931   TRIG 105 12/24/2015 1105   HDL 27 (L) 12/24/2015 1105   HDL 34 (L) 08/26/2015 0931   CHOLHDL 5.5 (H) 12/24/2015 1105   VLDL 21 12/24/2015 1105   LDLCALC 101 12/24/2015 1105   LDLCALC 131 (H) 08/26/2015 0931           Wt Readings from Last 3 Encounters:  09/16/16 156 lb (70.8 kg)  08/25/16 158 lb (71.7 kg)  08/23/16 152 lb (68.9 kg)     No flowsheet data found.    ASSESSMENT AND PLAN:  1.  Coronary artery disease involving native coronary arteries without angina: I discussed with him the recent findings of coronary calcifications on CT scan. He is known to have mild coronary calcifications based on his previous cardiac catheterization. Thus, the CT scan findings are not completely surprising but I cannot completely exclude  the possibility of CAD progression. He was also found to have mildly dilated pulmonary artery but recent echocardiogram showed only mild pulmonary hypertension. Left and right heart catheterization might clarify these issues and I did discuss this with him but he prefers to  wait for now given the improvement in his symptoms.  2. Hyperlipidemia: Currently on rosuvastatin 40 mg once daily.  3. Pulmonary fibrosis: Managed by [facility] and Dr. Khan.   4. Essential hypertension: I switched carvedilol to 6.25 mg twice daily instead of 12.5 mg once daily.     Disposition:   FU with me in 6 months  Signed,  Malachai Schalk, MD  09/16/2016 4:33 PM    [facility]      Addendum on January 12, 2017: The patient continues to have worsening exertional dyspnea.  He was evaluated by pulmonary and was noted to have stable pulmonary fibrosis.  I was contacted by Dr.Khan regarding a possible alternative etiology for his worsening status.  There is a concern about possible progression of coronary artery disease as well as possible pulmonary hypertension.  Due to all of that, we felt that the best option is to proceed with a right and left cardiac catheterization for a definitive diagnosis.  I discussed the procedure in details.   By physical exam today, heart is regular with no murmurs.  Lung exam reveals dry crackles and diminished breath sounds.  No edema is noted. Right radial pulse is normal.  All questions were answered and ready to proceed. 

## 2017-01-12 NOTE — Discharge Instructions (Signed)

## 2017-01-14 ENCOUNTER — Telehealth: Payer: Self-pay | Admitting: Cardiovascular Disease

## 2017-01-14 NOTE — Telephone Encounter (Signed)
Patient's wife called back. She verbalized understanding of plan of care.

## 2017-01-14 NOTE — Telephone Encounter (Signed)
S/w patient and patient's wife. Patient feels shortness of breath is slightly worse than previous. He experiences the shortness of breath at rest. They would like to know if the procedure can be moved to a sooner date. Denies chest pain, dizziness, palpitations, nausea vomiting or arm pain.  Advised that I would make Dr Kirke CorinArida aware for determination if necessary to perform procedure at a different time. Otherwise, patient and wife verbalized understanding to proceed to ER if develops new or worsening symptoms.

## 2017-01-14 NOTE — Telephone Encounter (Signed)
I do not have any cath days at [facility] before Wednesday. I suggest that they monitor symptoms and if there is worsening symptoms, they should go [facility] for admission and possibly the procedure can be done on Monday by one of my partners.

## 2017-01-14 NOTE — Telephone Encounter (Signed)
Patient wife calling and wants to know if he can move up procedure from 10/31 if not please call to discuss  Patient has increased sob and she doesn't think he needs to wait.

## 2017-01-14 NOTE — Telephone Encounter (Signed)
No answer. Left message to call back.   

## 2017-01-18 DIAGNOSIS — G4733 Obstructive sleep apnea (adult) (pediatric): Secondary | ICD-10-CM | POA: Diagnosis not present

## 2017-01-19 ENCOUNTER — Other Ambulatory Visit: Payer: Self-pay

## 2017-01-19 ENCOUNTER — Encounter (HOSPITAL_COMMUNITY)
Admission: RE | Disposition: A | Discharge status: Home / Self Care | Payer: Self-pay | Source: Ambulatory Visit | Attending: Cardiovascular Disease

## 2017-01-19 ENCOUNTER — Encounter (HOSPITAL_COMMUNITY): Payer: Self-pay | Admitting: Cardiovascular Disease

## 2017-01-19 ENCOUNTER — Ambulatory Visit (HOSPITAL_COMMUNITY)
Admission: RE | Admit: 2017-01-19 | Discharge: 2017-01-20 | Disposition: A | Discharge status: Home / Self Care | Payer: Medicare Other | Source: Ambulatory Visit | Attending: Cardiovascular Disease | Admitting: Cardiovascular Disease

## 2017-01-19 DIAGNOSIS — I272 Pulmonary hypertension, unspecified: Secondary | ICD-10-CM | POA: Insufficient documentation

## 2017-01-19 DIAGNOSIS — Z9841 Cataract extraction status, right eye: Secondary | ICD-10-CM | POA: Insufficient documentation

## 2017-01-19 DIAGNOSIS — G4730 Sleep apnea, unspecified: Secondary | ICD-10-CM | POA: Insufficient documentation

## 2017-01-19 DIAGNOSIS — Z7902 Long term (current) use of antithrombotics/antiplatelets: Secondary | ICD-10-CM | POA: Insufficient documentation

## 2017-01-19 DIAGNOSIS — D62 Acute posthemorrhagic anemia: Secondary | ICD-10-CM | POA: Insufficient documentation

## 2017-01-19 DIAGNOSIS — I10 Essential (primary) hypertension: Secondary | ICD-10-CM | POA: Diagnosis not present

## 2017-01-19 DIAGNOSIS — Z9842 Cataract extraction status, left eye: Secondary | ICD-10-CM | POA: Diagnosis not present

## 2017-01-19 DIAGNOSIS — E785 Hyperlipidemia, unspecified: Secondary | ICD-10-CM | POA: Diagnosis not present

## 2017-01-19 DIAGNOSIS — J9601 Acute respiratory failure with hypoxia: Secondary | ICD-10-CM | POA: Diagnosis not present

## 2017-01-19 DIAGNOSIS — Z955 Presence of coronary angioplasty implant and graft: Secondary | ICD-10-CM | POA: Diagnosis not present

## 2017-01-19 DIAGNOSIS — I208 Other forms of angina pectoris: Secondary | ICD-10-CM | POA: Diagnosis present

## 2017-01-19 DIAGNOSIS — Z7951 Long term (current) use of inhaled steroids: Secondary | ICD-10-CM | POA: Insufficient documentation

## 2017-01-19 DIAGNOSIS — Z79899 Other long term (current) drug therapy: Secondary | ICD-10-CM | POA: Insufficient documentation

## 2017-01-19 DIAGNOSIS — I2089 Other forms of angina pectoris: Secondary | ICD-10-CM | POA: Diagnosis present

## 2017-01-19 DIAGNOSIS — Z8249 Family history of ischemic heart disease and other diseases of the circulatory system: Secondary | ICD-10-CM | POA: Diagnosis not present

## 2017-01-19 DIAGNOSIS — I2510 Atherosclerotic heart disease of native coronary artery without angina pectoris: Secondary | ICD-10-CM | POA: Diagnosis not present

## 2017-01-19 DIAGNOSIS — H9190 Unspecified hearing loss, unspecified ear: Secondary | ICD-10-CM | POA: Diagnosis not present

## 2017-01-19 DIAGNOSIS — I25118 Atherosclerotic heart disease of native coronary artery with other forms of angina pectoris: Secondary | ICD-10-CM

## 2017-01-19 DIAGNOSIS — J8410 Pulmonary fibrosis, unspecified: Secondary | ICD-10-CM | POA: Diagnosis not present

## 2017-01-19 DIAGNOSIS — Z7982 Long term (current) use of aspirin: Secondary | ICD-10-CM | POA: Insufficient documentation

## 2017-01-19 DIAGNOSIS — Z9981 Dependence on supplemental oxygen: Secondary | ICD-10-CM | POA: Insufficient documentation

## 2017-01-19 HISTORY — PX: CORONARY ATHERECTOMY: CATH118238

## 2017-01-19 HISTORY — DX: Pneumonia, unspecified organism: J18.9

## 2017-01-19 HISTORY — DX: Benign prostatic hyperplasia without lower urinary tract symptoms: N40.0

## 2017-01-19 HISTORY — PX: CORONARY ANGIOPLASTY WITH STENT PLACEMENT: SHX49

## 2017-01-19 HISTORY — DX: Dependence on supplemental oxygen: Z99.81

## 2017-01-19 HISTORY — DX: Gastro-esophageal reflux disease without esophagitis: K21.9

## 2017-01-19 HISTORY — DX: Dependence on other enabling machines and devices: Z99.89

## 2017-01-19 HISTORY — PX: CORONARY STENT INTERVENTION: CATH118234

## 2017-01-19 HISTORY — DX: Obstructive sleep apnea (adult) (pediatric): G47.33

## 2017-01-19 LAB — CBC
HEMATOCRIT: 42.9 % (ref 39.0–52.0)
HEMOGLOBIN: 13.9 g/dL (ref 13.0–17.0)
MCH: 29.4 pg (ref 26.0–34.0)
MCHC: 32.4 g/dL (ref 30.0–36.0)
MCV: 90.9 fL (ref 78.0–100.0)
Platelets: 160 10*3/uL (ref 150–400)
RBC: 4.72 MIL/uL (ref 4.22–5.81)
RDW: 14.5 % (ref 11.5–15.5)
WBC: 6.7 10*3/uL (ref 4.0–10.5)

## 2017-01-19 LAB — BASIC METABOLIC PANEL
Anion gap: 5 (ref 5–15)
BUN: 15 mg/dL (ref 6–20)
CHLORIDE: 107 mmol/L (ref 101–111)
CO2: 28 mmol/L (ref 22–32)
Calcium: 9 mg/dL (ref 8.9–10.3)
Creatinine, Ser: 0.86 mg/dL (ref 0.61–1.24)
GFR calc Af Amer: >60 mL/min (ref >60)
GFR calc non Af Amer: >60 mL/min (ref >60)
GLUCOSE: 97 mg/dL (ref 65–99)
POTASSIUM: 4.2 mmol/L (ref 3.5–5.1)
Sodium: 140 mmol/L (ref 135–145)

## 2017-01-19 LAB — PROTIME-INR
INR: 1.1
Prothrombin Time: 14.1 seconds (ref 11.4–15.2)

## 2017-01-19 LAB — POCT ACTIVATED CLOTTING TIME: ACTIVATED CLOTTING TIME: 428 s

## 2017-01-19 SURGERY — CORONARY ATHERECTOMY
Anesthesia: LOCAL

## 2017-01-19 MED ORDER — FINASTERIDE 5 MG PO TABS
5.0000 mg | ORAL_TABLET | Freq: Every day | ORAL | Status: DC
  Administered 2017-01-19: 22:00:00 5 mg via ORAL
  Filled 2017-01-19 (×2): qty 1

## 2017-01-19 MED ORDER — ASPIRIN EC 81 MG PO TBEC
81.0000 mg | DELAYED_RELEASE_TABLET | Freq: Every day | ORAL | Status: DC
  Filled 2017-01-19: qty 1

## 2017-01-19 MED ORDER — FAMOTIDINE IN NACL 20-0.9 MG/50ML-% IV SOLN
INTRAVENOUS | Status: AC
  Filled 2017-01-19: qty 50

## 2017-01-19 MED ORDER — CLOPIDOGREL BISULFATE 75 MG PO TABS: 75.0000 mg | ORAL_TABLET | ORAL | Status: DC

## 2017-01-19 MED ORDER — LIDOCAINE HCL 2 % IJ SOLN
INTRAMUSCULAR | Status: AC
  Filled 2017-01-19: qty 20

## 2017-01-19 MED ORDER — BUDESONIDE 0.5 MG/2ML IN SUSP
0.5000 mg | Freq: Two times a day (BID) | RESPIRATORY_TRACT | Status: DC
  Administered 2017-01-19 – 2017-01-20 (×2): 0.5 mg via RESPIRATORY_TRACT
  Filled 2017-01-19 (×2): qty 2

## 2017-01-19 MED ORDER — MIDAZOLAM HCL 2 MG/2ML IJ SOLN
INTRAMUSCULAR | Status: AC
  Filled 2017-01-19: qty 2

## 2017-01-19 MED ORDER — MIDAZOLAM HCL 2 MG/2ML IJ SOLN
INTRAMUSCULAR | Status: DC | PRN
  Administered 2017-01-19 (×2): 1 mg via INTRAVENOUS

## 2017-01-19 MED ORDER — ACETAMINOPHEN 325 MG PO TABS: 650.0000 mg | ORAL_TABLET | ORAL | Status: DC | PRN

## 2017-01-19 MED ORDER — FAMOTIDINE IN NACL 20-0.9 MG/50ML-% IV SOLN
INTRAVENOUS | Status: DC | PRN
  Administered 2017-01-19: 20 mg via INTRAVENOUS

## 2017-01-19 MED ORDER — BIVALIRUDIN TRIFLUOROACETATE 250 MG IV SOLR
INTRAVENOUS | Status: AC
Start: 2017-01-19 — End: 2017-01-19
  Filled 2017-01-19: qty 250

## 2017-01-19 MED ORDER — IOPAMIDOL (ISOVUE-370) INJECTION 76%
INTRAVENOUS | Status: AC
Start: 2017-01-19 — End: 2017-01-19
  Filled 2017-01-19: qty 100

## 2017-01-19 MED ORDER — IOPAMIDOL (ISOVUE-370) INJECTION 76%
INTRAVENOUS | Status: AC
  Filled 2017-01-19: qty 100

## 2017-01-19 MED ORDER — CLOPIDOGREL BISULFATE 300 MG PO TABS
ORAL_TABLET | ORAL | Status: AC
  Filled 2017-01-19: qty 1

## 2017-01-19 MED ORDER — FENTANYL CITRATE (PF) 100 MCG/2ML IJ SOLN
INTRAMUSCULAR | Status: AC
  Filled 2017-01-19: qty 2

## 2017-01-19 MED ORDER — SODIUM CHLORIDE 0.9% FLUSH: 3.0000 mL | INTRAVENOUS | Status: DC | PRN

## 2017-01-19 MED ORDER — ZOLPIDEM TARTRATE 5 MG PO TABS
5.0000 mg | ORAL_TABLET | Freq: Every evening | ORAL | Status: DC | PRN
  Administered 2017-01-19: 5 mg via ORAL
  Filled 2017-01-19: qty 1

## 2017-01-19 MED ORDER — CLOPIDOGREL BISULFATE 300 MG PO TABS
ORAL_TABLET | ORAL | Status: DC | PRN
  Administered 2017-01-19: 300 mg via ORAL

## 2017-01-19 MED ORDER — SODIUM CHLORIDE 0.9 % WEIGHT BASED INFUSION
3.0000 mL/kg/h | INTRAVENOUS | Status: DC
  Administered 2017-01-19: 3 mL/kg/h via INTRAVENOUS

## 2017-01-19 MED ORDER — PIRFENIDONE 267 MG PO CAPS
801.0000 mg | ORAL_CAPSULE | Freq: Three times a day (TID) | ORAL | Status: DC
  Administered 2017-01-19 – 2017-01-20 (×2): 801 mg via ORAL
  Filled 2017-01-19 (×3): qty 3

## 2017-01-19 MED ORDER — NITROGLYCERIN 1 MG/10 ML FOR IR/CATH LAB
INTRA_ARTERIAL | Status: AC
  Filled 2017-01-19: qty 10

## 2017-01-19 MED ORDER — RIVASTIGMINE 9.5 MG/24HR TD PT24: 9.5000 mg | MEDICATED_PATCH | TRANSDERMAL | Status: DC

## 2017-01-19 MED ORDER — LABETALOL HCL 5 MG/ML IV SOLN
INTRAVENOUS | Status: AC
  Filled 2017-01-19: qty 4

## 2017-01-19 MED ORDER — LIDOCAINE HCL (PF) 1 % IJ SOLN
INTRAMUSCULAR | Status: DC | PRN
  Administered 2017-01-19: 18 mL

## 2017-01-19 MED ORDER — LABETALOL HCL 5 MG/ML IV SOLN
INTRAVENOUS | Status: DC | PRN
  Administered 2017-01-19: 10 mg via INTRAVENOUS

## 2017-01-19 MED ORDER — ONDANSETRON HCL 4 MG/2ML IJ SOLN: 4.0000 mg | Freq: Four times a day (QID) | INTRAMUSCULAR | Status: DC | PRN

## 2017-01-19 MED ORDER — MIRTAZAPINE 15 MG PO TABS
15.0000 mg | ORAL_TABLET | Freq: Every day | ORAL | Status: DC
  Administered 2017-01-19: 22:00:00 15 mg via ORAL
  Filled 2017-01-19: qty 1

## 2017-01-19 MED ORDER — FENTANYL CITRATE (PF) 100 MCG/2ML IJ SOLN
INTRAMUSCULAR | Status: DC | PRN
  Administered 2017-01-19 (×2): 25 ug via INTRAVENOUS

## 2017-01-19 MED ORDER — HEPARIN (PORCINE) IN NACL 2-0.9 UNIT/ML-% IJ SOLN
INTRAMUSCULAR | Status: DC | PRN
  Administered 2017-01-19: 500 mL
  Administered 2017-01-19: 1000 mL

## 2017-01-19 MED ORDER — MOMETASONE FURO-FORMOTEROL FUM 100-5 MCG/ACT IN AERO
2.0000 | INHALATION_SPRAY | Freq: Two times a day (BID) | RESPIRATORY_TRACT | Status: DC
  Administered 2017-01-19 – 2017-01-20 (×2): 2 via RESPIRATORY_TRACT
  Filled 2017-01-19: qty 8.8

## 2017-01-19 MED ORDER — SODIUM CHLORIDE 0.9% FLUSH: 3.0000 mL | Freq: Two times a day (BID) | INTRAVENOUS | Status: DC

## 2017-01-19 MED ORDER — SODIUM CHLORIDE 0.9 % IV SOLN
INTRAVENOUS | Status: DC | PRN
  Administered 2017-01-19 (×2): 1.75 mg/kg/h via INTRAVENOUS

## 2017-01-19 MED ORDER — ALBUTEROL SULFATE (2.5 MG/3ML) 0.083% IN NEBU: 2.5000 mg | INHALATION_SOLUTION | Freq: Four times a day (QID) | RESPIRATORY_TRACT | Status: DC | PRN

## 2017-01-19 MED ORDER — HEPARIN (PORCINE) IN NACL 2-0.9 UNIT/ML-% IJ SOLN
INTRAMUSCULAR | Status: AC
Start: 2017-01-19 — End: 2017-01-19
  Filled 2017-01-19: qty 500

## 2017-01-19 MED ORDER — SODIUM CHLORIDE 0.9 % WEIGHT BASED INFUSION: 1.0000 mL/kg/h | INTRAVENOUS | Status: DC

## 2017-01-19 MED ORDER — SODIUM CHLORIDE 0.9% FLUSH
3.0000 mL | Freq: Two times a day (BID) | INTRAVENOUS | Status: DC
  Administered 2017-01-19: 3 mL via INTRAVENOUS

## 2017-01-19 MED ORDER — BIVALIRUDIN TRIFLUOROACETATE 250 MG IV SOLR
INTRAVENOUS | Status: AC
  Filled 2017-01-19: qty 250

## 2017-01-19 MED ORDER — IOPAMIDOL (ISOVUE-370) INJECTION 76%
INTRAVENOUS | Status: DC | PRN
  Administered 2017-01-19: 295 mL via INTRA_ARTERIAL

## 2017-01-19 MED ORDER — IOPAMIDOL (ISOVUE-370) INJECTION 76%
INTRAVENOUS | Status: AC
  Filled 2017-01-19: qty 50

## 2017-01-19 MED ORDER — CLOPIDOGREL BISULFATE 75 MG PO TABS
75.0000 mg | ORAL_TABLET | Freq: Every day | ORAL | Status: DC
  Filled 2017-01-19: qty 1

## 2017-01-19 MED ORDER — LABETALOL HCL 5 MG/ML IV SOLN
10.0000 mg | INTRAVENOUS | Status: AC | PRN
Start: 2017-01-19 — End: 2017-01-19

## 2017-01-19 MED ORDER — SODIUM CHLORIDE 0.9 % IV SOLN: 250.0000 mL | INTRAVENOUS | Status: DC | PRN

## 2017-01-19 MED ORDER — SODIUM CHLORIDE 0.9 % IV SOLN
INTRAVENOUS | Status: AC
  Administered 2017-01-19: 12:00:00 via INTRAVENOUS

## 2017-01-19 MED ORDER — HYDRALAZINE HCL 20 MG/ML IJ SOLN
5.0000 mg | Freq: Once | INTRAMUSCULAR | Status: DC
  Filled 2017-01-19: qty 1

## 2017-01-19 MED ORDER — ROSUVASTATIN CALCIUM 40 MG PO TABS
40.0000 mg | ORAL_TABLET | Freq: Every day | ORAL | Status: DC
  Administered 2017-01-19: 17:00:00 40 mg via ORAL
  Filled 2017-01-19: qty 2
  Filled 2017-01-19: qty 1
  Filled 2017-01-19: qty 2
  Filled 2017-01-19: qty 1

## 2017-01-19 MED ORDER — CARVEDILOL 3.125 MG PO TABS
6.2500 mg | ORAL_TABLET | Freq: Two times a day (BID) | ORAL | Status: DC
  Administered 2017-01-19 – 2017-01-20 (×3): 6.25 mg via ORAL
  Filled 2017-01-19 (×3): qty 2

## 2017-01-19 MED ORDER — BIVALIRUDIN BOLUS VIA INFUSION - CUPID
INTRAVENOUS | Status: DC | PRN
  Administered 2017-01-19: 52.725 mg via INTRAVENOUS

## 2017-01-19 MED ORDER — NITROGLYCERIN 1 MG/10 ML FOR IR/CATH LAB
INTRA_ARTERIAL | Status: DC | PRN
  Administered 2017-01-19: 200 ug via INTRACORONARY

## 2017-01-19 MED ORDER — SODIUM CHLORIDE 0.9% FLUSH
3.0000 mL | INTRAVENOUS | Status: DC | PRN
Start: 2017-01-19 — End: 2017-01-19

## 2017-01-19 MED ORDER — ASPIRIN 81 MG PO CHEW: 81.0000 mg | CHEWABLE_TABLET | ORAL | Status: DC

## 2017-01-19 MED ORDER — HEPARIN (PORCINE) IN NACL 2-0.9 UNIT/ML-% IJ SOLN
INTRAMUSCULAR | Status: AC
  Filled 2017-01-19: qty 500

## 2017-01-19 SURGICAL SUPPLY — 31 items
BALLN MAVERICK OTW 15X2.5 (BALLOONS) ×2
BALLN MINITREK OTW 1.5X12 (BALLOONS) ×2
BALLN SAPPHIRE ~~LOC~~ 3.0X18 (BALLOONS) ×2 IMPLANT
BALLN SAPPHIRE ~~LOC~~ 3.5X15 (BALLOONS) ×2 IMPLANT
BALLOON MAVERICK OTW 15X2.5 (BALLOONS) ×1 IMPLANT
BALLOON MINITREK OTW 1.5X12 (BALLOONS) ×1 IMPLANT
CATH VISTA GUIDE 6FR JL3.5 (CATHETERS) ×2 IMPLANT
CATH VISTA GUIDE 6FR XBLAD3.0 (CATHETERS) ×2 IMPLANT
CATH VISTA GUIDE 6FR XBLAD3.5 (CATHETERS) ×2 IMPLANT
CATH VISTA GUIDE 6FR XBLAD4 (CATHETERS) ×2 IMPLANT
CATH VISTA GUIDE 7FR AL2 (CATHETERS) ×2 IMPLANT
CATH VISTA GUIDE 7FR JL3 (CATHETERS) ×2 IMPLANT
CATH VISTA GUIDE 7FR JL3.5 (CATHETERS) ×2 IMPLANT
CROWN DIAMONDBACK CLASSIC 1.25 (BURR) ×2 IMPLANT
GUIDE CATH MACH 1 7F AL.75 (CATHETERS) ×2 IMPLANT
GUIDELINER 6F (CATHETERS) ×2 IMPLANT
KIT ENCORE 26 ADVANTAGE (KITS) ×4 IMPLANT
KIT HEART LEFT (KITS) ×2 IMPLANT
LUBRICANT VIPERSLIDE CORONARY (MISCELLANEOUS) ×2 IMPLANT
PACK CARDIAC CATHETERIZATION (CUSTOM PROCEDURE TRAY) ×2 IMPLANT
SHEATH PINNACLE 6F 10CM (SHEATH) ×2 IMPLANT
SHEATH PINNACLE 7F 10CM (SHEATH) ×2 IMPLANT
SHEATH PINNACLE ST 7F 65CM (SHEATH) ×2 IMPLANT
STENT SIERRA 3.00 X 33 MM (Permanent Stent) ×2 IMPLANT
STENT SIERRA 3.25 X 08 MM (Permanent Stent) ×2 IMPLANT
TRANSDUCER W/STOPCOCK (MISCELLANEOUS) ×2 IMPLANT
TUBING CIL FLEX 10 FLL-RA (TUBING) ×2 IMPLANT
WIRE MAILMAN 300CM (WIRE) ×2 IMPLANT
WIRE RUNTHROUGH .014X180CM (WIRE) ×2 IMPLANT
WIRE RUNTHROUGH .014X300CM (WIRE) ×2 IMPLANT
WIRE VIPER ADVANCE COR .012TIP (WIRE) ×2 IMPLANT

## 2017-01-19 NOTE — H&P (View-Only) (Signed)
Cardiology Office Note   Date:  09/16/2016   ID:  Vincent CoderMohammad A Gaza, DOB 12-Jun-1932, MRN [id number]  PCP:  Yevonne PaxKhan, Saadat A, MD     Cardiologist:   Lorine BearsMuhammad Naheim Burgen, MD       Chief Complaint  Patient presents with  . other    F/u echo. Meds reviewed verbally with pt.      History of Present Illness: Vincent Kennedy is a 83 y.o. male who Is here today for follow-up visit regarding coronary artery disease. 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 In 2008 when he presented with unstable angina. Cardiac catheterization showed 99% ostial first diagonal stenosis as well as mild left main and LAD disease. He underwent successful angioplasty and drug-eluting stent placement to the ostial first diagonal. He has not had any cardiac events since then. Most recent nuclear stress test in March 2016 was normal.  He was diagnosed with pulmonary fibrosis in 2016. He is on continuous oxygen therapy.  He was hospitalized recently at [facility] with symptoms of pneumonia. CT scan of the chest showed evidence of right sided pneumonia with coronary calcifications including left main and dilated pulmonary artery suggestive of pulmonary hypertension. He improved with antibiotics. He had urinary retention which required Foley catheter placement. He had an echocardiogram done while hospitalized which showed normal LV systolic function with an EF of 55-60%, grade 1 diastolic dysfunction and mild pulmonary hypertension with peak systolic pressure 42 mmHg.  He has been doing reasonably well and reports that she is almost 90% back to his baseline. No chest pain.      Past Medical History:  Diagnosis Date  . Abnormal nuclear cardiac imaging test    Dr. Kirke CorinArida  . Cataract   . Coronary artery disease    Cardiac cath September 2008: 20% left main stenosis, 40% mid LAD stenosis, 99% ostial first diagonal stenosis in a large branch and 90% ostial disease in second diagonal but the vessel was very small, 20% mid RCA  stenosis with normal ejection fraction. Successful angioplasty and drug-eluting stent placement to the ostial first diagonal with a 2.5 x 15 mm Xience stent  . Hyperlipidemia   . Hypertension    BP has never been high(per pt).  Metoprolol is to keep BP low because of stent (afterload)  . Oxygen deficiency   . Pulmonary fibrosis (HCC) 08/26/2015  . Shortness of breath dyspnea    with exertion  . Sleep apnea    has not used CPAP recently.  Feels condition has improved.  . Wears hearing aid    (sometimes)         Past Surgical History:  Procedure Laterality Date  . CARDIAC CATHETERIZATION     2010  . CATARACT EXTRACTION W/PHACO Right 01/29/2015   Procedure: CATARACT EXTRACTION PHACO AND INTRAOCULAR LENS PLACEMENT (IOC);  Surgeon: [name], MD;  [facility];  Service: Ophthalmology;  Laterality: Right;  RESTOR LENS CPAP  . CATARACT EXTRACTION W/PHACO Left 03/12/2015   Procedure: CATARACT EXTRACTION PHACO AND INTRAOCULAR LENS PLACEMENT (IOC);  Surgeon: [name], MD;  [facility];  Service: Ophthalmology;  Laterality: Left;  RESTOR SHUGARCAINE  . COLONOSCOPY WITH ESOPHAGOGASTRODUODENOSCOPY (EGD)    . CORONARY ANGIOPLASTY  2010  . EYE SURGERY     cataract  . HERNIA REPAIR Bilateral    inguinal           Current Outpatient Prescriptions  Medication Sig Dispense Refill  . albuterol (PROVENTIL HFA;VENTOLIN HFA) 108 (90 Base)  MCG/ACT inhaler Inhale 2 puffs into the lungs every 6 (six) hours as needed for wheezing or shortness of breath. 1 Inhaler 2  . aspirin EC 325 MG tablet Take 1 tablet (325 mg total) by mouth daily. 30 tablet 0  . budesonide (PULMICORT) 0.5 MG/2ML nebulizer solution Take 2 mLs (0.5 mg total) by nebulization 2 (two) times daily. 2 mL 12  . finasteride (PROSCAR) 5 MG tablet Take 1 tablet (5 mg total) by mouth daily. 90 tablet 1  . ipratropium-albuterol (DUONEB) 0.5-2.5 (3) MG/3ML SOLN  Take 3 mLs by nebulization every 6 (six) hours. 360 mL 2  . mirtazapine (REMERON) 15 MG tablet Take 1 tablet (15 mg total) by mouth at bedtime. 30 tablet 11  . mometasone-formoterol (DULERA) 100-5 MCG/ACT AERO Inhale 2 puffs into the lungs 2 (two) times daily. 1 Inhaler 1  . pantoprazole (PROTONIX) 40 MG tablet Take 1 tablet (40 mg total) by mouth daily as needed. Caution:prolonged use may increase risk of pneumonia, colitis, osteoporosis, anemia (Patient taking differently: Take 40 mg by mouth daily. Caution:prolonged use may increase risk of pneumonia, colitis, osteoporosis, anemi) 30 tablet 1  . Pirfenidone (ESBRIET) 267 MG CAPS Take 3 capsules by mouth 3 (three) times daily.     . rivastigmine (EXELON) 9.5 mg/24hr Place 1 patch (9.5 mg total) onto the skin daily. 30 patch 2  . rosuvastatin (CRESTOR) 40 MG tablet Take 1 tablet (40 mg total) by mouth daily. 90 tablet 1  . tamsulosin (FLOMAX) 0.4 MG CAPS capsule Take 1 capsule (0.4 mg total) by mouth daily. 90 capsule 1  . zolpidem (AMBIEN) 5 MG tablet Take 5 mg by mouth at bedtime as needed.    . carvedilol (COREG) 12.5 MG tablet Take 0.5 tablets (6.25 mg total) by mouth 2 (two) times daily. 90 tablet 1   No current facility-administered medications for this visit.     Allergies:   Patient has no known allergies.    Social History:  The patient  reports that he has never smoked. He has never used smokeless tobacco. He reports that he does not drink alcohol or use drugs.   Family History:  The patient's family history includes CAD in his brother and father.    ROS:  Please see the history of present illness.   Otherwise, review of systems are positive for none.   All other systems are reviewed and negative.    PHYSICAL EXAM: VS:  BP 108/60 (BP Location: Left Arm, Patient Position: Sitting, Cuff Size: Normal)   Pulse 80   Ht 5\' 8"  (1.727 m)   Wt 156 lb (70.8 kg)   BMI 23.72 kg/m  , BMI Body mass index is 23.72  kg/m. GEN: Well nourished, well developed, in no acute distress  HEENT: normal  Neck: no JVD, carotid bruits, or masses Cardiac: RRR; no murmurs, rubs, or gallops,no edema  Respiratory:  Diminished breath sounds bilaterally with fine crackles, bronchial breath sounds at the right base, normal work of breathing GI: soft, nontender, nondistended, + BS MS: no deformity or atrophy  Skin: warm and dry, no rash Neuro:  Strength and sensation are intact Psych: euthymic mood, full affect   EKG:  EKG is ordered today. The ekg ordered today demonstrates sinus rhythm with sinus arrhythmia   Recent Labs: 08/15/2016: ALT 26; B Natriuretic Peptide 98.0 08/17/2016: BUN 19; Creatinine, Ser 0.71; Hemoglobin 12.5; Platelets 241; Potassium 4.0; Sodium 138    Lipid Panel Labs(Brief)  Component Value Date/Time   CHOL 149 12/24/2015 1105   CHOL 187 08/26/2015 0931   TRIG 105 12/24/2015 1105   HDL 27 (L) 12/24/2015 1105   HDL 34 (L) 08/26/2015 0931   CHOLHDL 5.5 (H) 12/24/2015 1105   VLDL 21 12/24/2015 1105   LDLCALC 101 12/24/2015 1105   LDLCALC 131 (H) 08/26/2015 0931           Wt Readings from Last 3 Encounters:  09/16/16 156 lb (70.8 kg)  08/25/16 158 lb (71.7 kg)  08/23/16 152 lb (68.9 kg)     No flowsheet data found.    ASSESSMENT AND PLAN:  1.  Coronary artery disease involving native coronary arteries without angina: I discussed with him the recent findings of coronary calcifications on CT scan. He is known to have mild coronary calcifications based on his previous cardiac catheterization. Thus, the CT scan findings are not completely surprising but I cannot completely exclude  the possibility of CAD progression. He was also found to have mildly dilated pulmonary artery but recent echocardiogram showed only mild pulmonary hypertension. Left and right heart catheterization might clarify these issues and I did discuss this with him but he prefers to  wait for now given the improvement in his symptoms.  2. Hyperlipidemia: Currently on rosuvastatin 40 mg once daily.  3. Pulmonary fibrosis: Managed by [facility] and [name].   4. Essential hypertension: I switched carvedilol to 6.25 mg twice daily instead of 12.5 mg once daily.     Disposition:   FU with me in 6 months  Signed,  [name], MD  09/16/2016 4:33 PM    [facility]      Addendum on January 12, 2017: The patient continues to have worsening exertional dyspnea.  He was evaluated by pulmonary and was noted to have stable pulmonary fibrosis.  I was contacted by Dr.Khan regarding a possible alternative etiology for his worsening status.  There is a concern about possible progression of coronary artery disease as well as possible pulmonary hypertension.  Due to all of that, we felt that the best option is to proceed with a right and left cardiac catheterization for a definitive diagnosis.  I discussed the procedure in details.   By physical exam today, heart is regular with no murmurs.  Lung exam reveals dry crackles and diminished breath sounds.  No edema is noted. Right radial pulse is normal.  All questions were answered and ready to proceed.

## 2017-01-19 NOTE — Care Management Note (Signed)
Case Management Note  Patient Details  Name: Vincent Kennedy MRN: [id number] Date of Birth: [birth year]  Subjective/Objective:   From home, s/p coronary stent intervention, will be on plavix per cath report.                   Action/Plan: NCM will follow for dc needs.   Expected Discharge Date:                  Expected Discharge Plan:  Home w Home Health Services  In-House Referral:     Discharge planning Services  CM Consult  Post Acute Care Choice:    Choice offered to:     DME Arranged:    DME Agency:     HH Arranged:    HH Agency:     Status of Service:  In process, will continue to follow  If discussed at Long Length of Stay Meetings, dates discussed:    Additional Comments:  Leone Havenaylor, Saulo Anthis Clinton, RN 01/19/2017, 2:30 PM

## 2017-01-19 NOTE — Interval H&P Note (Signed)
Cath Lab Visit (complete for each Cath Lab visit)  Clinical Evaluation Leading to the Procedure:   ACS: No.  Non-ACS:    Anginal Classification: CCS III  Anti-ischemic medical therapy: Minimal Therapy (1 class of medications)  Non-Invasive Test Results: No non-invasive testing performed  Prior CABG: No previous CABG      History and Physical Interval Note:  01/19/2017 8:39 AM  Vincent Kennedy  has presented today for surgery, with the diagnosis of cad  The various methods of treatment have been discussed with the patient and family. After consideration of risks, benefits and other options for treatment, the patient has consented to  Procedure(s): CORONARY ATHERECTOMY - CSI (N/A) as a surgical intervention .  The patient's history has been reviewed, patient examined, no change in status, stable for surgery.  I have reviewed the patient's chart and labs.  Questions were answered to the patient's satisfaction.     Lorine BearsMuhammad Javonn Gauger

## 2017-01-19 NOTE — Progress Notes (Signed)
Site area: right groin  Site Prior to Removal:  Level 0  Pressure Applied For 25 MINUTES    Minutes Beginning at 1350  Manual:   Yes.    Patient Status During Pull:  Patient remained A&O by 4, no complaints of pain, no bleeding, and no hematoma noted during pull.  Post Pull Groin Site:  Level 0  Post Pull Instructions Given:  Yes.    Post Pull Pulses Present:  Yes.    Dressing Applied:  Yes.    Comments:  Patient remains on bedrest. Tolerated well. Dressing in place C/D/I. Soft to touch. No hematoma. No bleeding. Call bell is in reach.

## 2017-01-19 NOTE — Progress Notes (Signed)
Patient used urinal and calls out to state "I am bleeding." Upon entering room, blood noted to sheets, right groin site bleeding with hematoma noted. Pressure held for additional ten minutes. Bleeding stops and hematoma resolved. Bruise noted. New dressing applied. Post removal instruction provided to patient and patient's wife again, teach back effective. Call bell is in reach. Bedrest until 2045 at this time. HOB remains below 30 degrees.

## 2017-01-19 NOTE — Progress Notes (Signed)
Do not give Coreg at this time per Su Hiltoberts NP, can give at later time if HR is greater than 60. Will order Hydralazine for elevated BP for sheath pull.

## 2017-01-20 ENCOUNTER — Other Ambulatory Visit: Payer: Self-pay

## 2017-01-20 ENCOUNTER — Ambulatory Visit (INDEPENDENT_AMBULATORY_CARE_PROVIDER_SITE_OTHER): Payer: Medicare Other

## 2017-01-20 ENCOUNTER — Telehealth: Payer: Self-pay | Admitting: Cardiovascular Disease

## 2017-01-20 VITALS — BP 112/60 | HR 73 | Resp 16

## 2017-01-20 DIAGNOSIS — Z79899 Other long term (current) drug therapy: Secondary | ICD-10-CM | POA: Diagnosis not present

## 2017-01-20 DIAGNOSIS — I25118 Atherosclerotic heart disease of native coronary artery with other forms of angina pectoris: Secondary | ICD-10-CM | POA: Diagnosis not present

## 2017-01-20 DIAGNOSIS — E785 Hyperlipidemia, unspecified: Secondary | ICD-10-CM | POA: Diagnosis not present

## 2017-01-20 DIAGNOSIS — Z9981 Dependence on supplemental oxygen: Secondary | ICD-10-CM | POA: Diagnosis not present

## 2017-01-20 DIAGNOSIS — Z7982 Long term (current) use of aspirin: Secondary | ICD-10-CM | POA: Diagnosis not present

## 2017-01-20 DIAGNOSIS — I10 Essential (primary) hypertension: Secondary | ICD-10-CM | POA: Diagnosis not present

## 2017-01-20 DIAGNOSIS — Z955 Presence of coronary angioplasty implant and graft: Secondary | ICD-10-CM | POA: Diagnosis not present

## 2017-01-20 DIAGNOSIS — H9190 Unspecified hearing loss, unspecified ear: Secondary | ICD-10-CM | POA: Diagnosis not present

## 2017-01-20 DIAGNOSIS — Z9841 Cataract extraction status, right eye: Secondary | ICD-10-CM | POA: Diagnosis not present

## 2017-01-20 DIAGNOSIS — R0602 Shortness of breath: Secondary | ICD-10-CM

## 2017-01-20 DIAGNOSIS — Z7902 Long term (current) use of antithrombotics/antiplatelets: Secondary | ICD-10-CM | POA: Diagnosis not present

## 2017-01-20 DIAGNOSIS — Z7951 Long term (current) use of inhaled steroids: Secondary | ICD-10-CM | POA: Diagnosis not present

## 2017-01-20 DIAGNOSIS — I272 Pulmonary hypertension, unspecified: Secondary | ICD-10-CM | POA: Diagnosis not present

## 2017-01-20 DIAGNOSIS — R0789 Other chest pain: Secondary | ICD-10-CM

## 2017-01-20 DIAGNOSIS — I208 Other forms of angina pectoris: Secondary | ICD-10-CM | POA: Diagnosis not present

## 2017-01-20 DIAGNOSIS — Z9842 Cataract extraction status, left eye: Secondary | ICD-10-CM | POA: Diagnosis not present

## 2017-01-20 DIAGNOSIS — J8410 Pulmonary fibrosis, unspecified: Secondary | ICD-10-CM | POA: Diagnosis not present

## 2017-01-20 DIAGNOSIS — G4730 Sleep apnea, unspecified: Secondary | ICD-10-CM | POA: Diagnosis not present

## 2017-01-20 DIAGNOSIS — D62 Acute posthemorrhagic anemia: Secondary | ICD-10-CM | POA: Diagnosis not present

## 2017-01-20 DIAGNOSIS — Z8249 Family history of ischemic heart disease and other diseases of the circulatory system: Secondary | ICD-10-CM | POA: Diagnosis not present

## 2017-01-20 LAB — CBC
HEMATOCRIT: 35.5 % — AB (ref 39.0–52.0)
HEMOGLOBIN: 11.6 g/dL — AB (ref 13.0–17.0)
MCH: 29.2 pg (ref 26.0–34.0)
MCHC: 32.7 g/dL (ref 30.0–36.0)
MCV: 89.4 fL (ref 78.0–100.0)
Platelets: 137 10*3/uL — ABNORMAL LOW (ref 150–400)
RBC: 3.97 MIL/uL — ABNORMAL LOW (ref 4.22–5.81)
RDW: 14.3 % (ref 11.5–15.5)
WBC: 10.4 10*3/uL (ref 4.0–10.5)

## 2017-01-20 LAB — BASIC METABOLIC PANEL
ANION GAP: 6 (ref 5–15)
BUN: 10 mg/dL (ref 6–20)
CHLORIDE: 106 mmol/L (ref 101–111)
CO2: 25 mmol/L (ref 22–32)
Calcium: 8.2 mg/dL — ABNORMAL LOW (ref 8.9–10.3)
Creatinine, Ser: 0.76 mg/dL (ref 0.61–1.24)
GFR calc Af Amer: >60 mL/min (ref >60)
GFR calc non Af Amer: >60 mL/min (ref >60)
Glucose, Bld: 83 mg/dL (ref 65–99)
POTASSIUM: 4.2 mmol/L (ref 3.5–5.1)
SODIUM: 137 mmol/L (ref 135–145)

## 2017-01-20 MED ORDER — CARVEDILOL 6.25 MG PO TABS: 6.2500 mg | ORAL_TABLET | Freq: Two times a day (BID) | ORAL | 6 refills | Status: DC

## 2017-01-20 MED ORDER — PANTOPRAZOLE SODIUM 40 MG PO TBEC: 40.0000 mg | DELAYED_RELEASE_TABLET | Freq: Every day | ORAL | 6 refills | Status: DC

## 2017-01-20 MED ORDER — NITROGLYCERIN 0.4 MG SL SUBL: 0.4000 mg | SUBLINGUAL_TABLET | SUBLINGUAL | 12 refills | Status: DC | PRN

## 2017-01-20 NOTE — Patient Instructions (Signed)
1.) Reason for visit: EKG  2.) Name of MD requesting visit: Eula Listenyan Dunn, PA-C  3.) H&P: [facility] on 10/[facility]  4.) ROS related to problem: Pt reports oxygen dropped into the 80s at home. He is currently on 3L which is his normal amount. Oxygen in office: 93-95% on 3L  5.) Assessment and plan per MD: EKG reviewed by Dr. Kirke CorinArida who requested a limited echo. Pt added to echo schedule. Dr. Kirke CorinArida in the office and will review results.

## 2017-01-20 NOTE — Care Management Note (Signed)
Case Management Note  Patient Details  Name: Vincent Kennedy MRN: [id number] Date of Birth: 10-13-1932  Subjective/Objective:   From home, s/p coronary stent intervention, will be on plavix per cath report.  No NCM referral, no dc needs , for dc today.                                  Action/Plan:   Expected Discharge Date:  01/20/17               Expected Discharge Plan:  Home w Home Health Services  In-House Referral:     Discharge planning Services  CM Consult  Post Acute Care Choice:    Choice offered to:     DME Arranged:    DME Agency:     HH Arranged:    HH Agency:     Status of Service:  Completed, signed off  If discussed at [employer] Meetings, dates discussed:    Additional Comments:  Leone Havenaylor, Jeremy Mclamb Clinton, RN 01/20/2017, 9:07 AM

## 2017-01-20 NOTE — Telephone Encounter (Signed)
Met with pt and his wife in the lobby. Pt reports being discharged from [facility] this morning after PCI yesterday with Dr. Fletcher Anon. Once home, he took a shower, felt SOB and found his oxygen level to be in the 80s.  He then came to the office requesting to see Dr. Fletcher Anon.  Discussed with Christell Faith, PA-C, who asked for [place] to obtain an EKG. Contacted Dr. Fletcher Anon who reviewed EKG and vital signs and ordered limited echo. Nurse visit documented.

## 2017-01-20 NOTE — Discharge Summary (Signed)
Discharge Summary    Patient ID: Vincent Kennedy,  MRN: [id number], DOB/AGE: [birth year] 83 y.o.  Admit date: 01/19/2017 Discharge date: 01/20/2017  Primary Care Provider: [facility], [place] Primary Cardiologist: Dr. Excell Seltzer  Discharge Diagnoses    Active Problems:   Effort angina (HCC)  HTN   HLD   CAD  Allergies No Known Allergies  Diagnostic Studies/Procedures    CORONARY ATHERECTOMY - CSI  Conclusion     Ost 2nd Diag to 2nd Diag lesion, 30 %stenosed.  Ost 1st Diag to 1st Diag lesion, 0 %stenosed.  Mid LAD lesion, 95 %stenosed.  Post intervention, there is a 0% residual stenosis.  A stent was successfully placed.  A stent was successfully placed, and overlaps previously placed stent.  Prox LAD lesion, 60 %stenosed.  Post intervention, there is a 0% residual stenosis.  Successful complex angioplasty and 2 overlapped drug-eluting stent placement to the mid and proximal LAD.  Very difficult procedure due to unusual takeoff of the left main coronary artery as well as heavy calcifications with inability to perform atherectomy due to presence of ostial first diagonal stent protruding into the LAD by 1 mm.  Recommendations: Dual antiplatelet therapy for at least one year. Hydration overnight and recheck renal function in the morning given excessive contrast use. will need to give precautions for excessive radiation exposure. Fluoroscopy time was 45 minutes.   Diagnostic Diagram       Post-Intervention Diagram           History of Present Illness     83 y.o. retired Development worker, international aid with hx of CAD, pulmonary fibrosis, HLD, HTN and sleep apnea who recently evaluated by Dr. [name] for worsening exertional dyspnea and underwent  for right and left cardiac catheterization for a definitive diagnosis as described below 01/12/17:  1. Severe one-vessel coronary artery disease with 95% heavily calcified stenosis in the mid LAD at the  origin of second diagonal. Patent stent in the first diagonal with no significant restenosis. There is also moderate proximal LAD stenosis. 2. Normal LV systolic function. 3. Right heart catheterization showed normal filling pressures, minimal pulmonary hypertension and normal cardiac output. 4. Saturation none showed evidence of a step up at the atrial level suggestive of ASD. PA sat was 78%, RV sat was 77%, RA sat was 76% and SVC sat was 64%.  Recommendations: Recommend LAD PCI with atherectomy via the femoral approach given significant difficulty engaging the coronary vessels via the right radial artery due to right subclavian tortuosity. Start the patient on Plavix. Consider a transesophageal echocardiogram to evaluate for possible intracardiac shunting.  Hospital Course     Consultants: None  He presented for schedule PCI. S/p Successful complex angioplasty and 2 overlapped drug-eluting stent placement to the mid and proximal LAD. Fluoroscopy time was 45 minutes. Continue ASA, Plavix, statin, BB. He will continue cardiac rehab. Scr stable post cath. Hgb mildly reduce to 11.6 post cath. Ambulated well.   Some studies suggest Prilosec/Omeprazole interacts with Plavix. We changed your Prilosec/Omeprazole to Protonix for less chance of interaction.   The patient has been seen by Dr. Excell Seltzer today and deemed ready for discharge home. All follow-up appointments have been scheduled. Discharge medications are listed below.    Discharge Vitals Blood pressure (!) 118/45, pulse 75, temperature 98.8 F (37.1 C), temperature source Oral, resp. rate (!) 21, height 5' 7.5" (1.715 m), weight 155 lb (70.3 kg), SpO2 94 %.  Filed Weights   01/19/17 0647  Weight:  155 lb (70.3 kg)    Labs & Radiologic Studies     CBC  Recent Labs  01/19/17 0741 01/20/17 0444  WBC 6.7 10.4  HGB 13.9 11.6*  HCT 42.9 35.5*  MCV 90.9 89.4  PLT 160 137*   Basic Metabolic Panel  Recent Labs   16/10/96 0741 01/20/17 0444  NA 140 137  K 4.2 4.2  CL 107 106  CO2 28 25  GLUCOSE 97 83  BUN 15 10  CREATININE 0.86 0.76  CALCIUM 9.0 8.2*    No results found.  Disposition   Pt is being discharged home today in good condition.  Follow-up Plans & Appointments    Follow-up Information    Iran Ouch, MD. Go on 02/04/2017.   Specialty:  Cardiology Why:  @3 :20pm for post cath  Contact information: [address] [place] [address] [phone]          Discharge Instructions    AMB Referral to Cardiac Rehabilitation - Phase II    Complete by:  As directed    Diagnosis:  Coronary Stents   Diet - low sodium heart healthy    Complete by:  As directed    Discharge instructions    Complete by:  As directed    No driving for 48 hours. No lifting over 5 lbs for 1 week. No sexual activity for 1 week.  Keep procedure site clean & dry. If you notice increased pain, swelling, bleeding or pus, call/return!  You may shower, but no soaking baths/hot tubs/pools for 1 week.   Some studies suggest Prilosec/Omeprazole interacts with Plavix. We changed your Prilosec/Omeprazole to Protonix for less chance of interaction.   Increase activity slowly    Complete by:  As directed       Discharge Medications   Current Discharge Medication List    START taking these medications   Details  nitroGLYCERIN (NITROSTAT) 0.4 MG SL tablet Place 1 tablet (0.4 mg total) under the tongue every 5 (five) minutes as needed for chest pain. Qty: 25 tablet, Refills: 12    pantoprazole (PROTONIX) 40 MG tablet Take 1 tablet (40 mg total) by mouth daily. Qty: 30 tablet, Refills: 6      CONTINUE these medications which have CHANGED   Details  carvedilol (COREG) 6.25 MG tablet Take 1 tablet (6.25 mg total) by mouth 2 (two) times daily. Qty: 60 tablet, Refills: 6      CONTINUE these medications which have NOT CHANGED   Details  aspirin EC 81 MG tablet Take 1 tablet (81  mg total) by mouth daily. Qty: 30 tablet, Refills: 6    budesonide (PULMICORT) 0.5 MG/2ML nebulizer solution Take 2 mLs (0.5 mg total) by nebulization 2 (two) times daily. Qty: 2 mL, Refills: 12    clopidogrel (PLAVIX) 75 MG tablet Take 1 tablet (75 mg total) by mouth daily. Qty: 30 tablet, Refills: 6    finasteride (PROSCAR) 5 MG tablet Take 1 tablet (5 mg total) by mouth daily. Qty: 90 tablet, Refills: 1    mirtazapine (REMERON) 15 MG tablet Take 1 tablet (15 mg total) by mouth at bedtime. Qty: 30 tablet, Refills: 11    mometasone-formoterol (DULERA) 100-5 MCG/ACT AERO Inhale 2 puffs into the lungs 2 (two) times daily. Qty: 1 Inhaler, Refills: 1    Pirfenidone (ESBRIET) 267 MG CAPS Take 801 mg by mouth 3 (three) times daily.     rivastigmine (EXELON) 9.5 mg/24hr Place 1 patch (9.5 mg total) onto  the skin daily. Qty: 30 patch, Refills: 2    rosuvastatin (CRESTOR) 40 MG tablet TAKE ONE TABLET EVERY DAY Qty: 90 tablet, Refills: 2    zolpidem (AMBIEN) 5 MG tablet Take 5 mg by mouth at bedtime as needed for sleep.     albuterol (PROVENTIL HFA;VENTOLIN HFA) 108 (90 Base) MCG/ACT inhaler Inhale 2 puffs into the lungs every 6 (six) hours as needed for wheezing or shortness of breath. Qty: 1 Inhaler, Refills: 2      STOP taking these medications     omeprazole (PRILOSEC OTC) 20 MG tablet          Outstanding Labs/Studies  None  Duration of Discharge Encounter   Greater than 30 minutes including physician time.  Signed, Therma Lasure PA-C 01/20/2017, 8:39 AM

## 2017-01-20 NOTE — Progress Notes (Signed)
Progress Note  Patient Name: Vincent Kennedy Date of Encounter: 01/20/2017  Primary Cardiologist:Dr. Kirke CorinArida   Subjective   Feeling well. No chest pain, sob or palpitations.   Inpatient Medications    Scheduled Meds: . aspirin EC  81 mg Oral Daily  . budesonide  0.5 mg Nebulization BID  . carvedilol  6.25 mg Oral BID  . clopidogrel  75 mg Oral Daily  . finasteride  5 mg Oral Daily  . hydrALAZINE  5 mg Intravenous Once  . mirtazapine  15 mg Oral QHS  . mometasone-formoterol  2 puff Inhalation BID  . Pirfenidone  801 mg Oral TID  . rivastigmine  9.5 mg Transdermal Weekly  . rosuvastatin  40 mg Oral Daily  . sodium chloride flush  3 mL Intravenous Q12H   Continuous Infusions: . sodium chloride     PRN Meds: sodium chloride, acetaminophen, albuterol, ondansetron (ZOFRAN) IV, sodium chloride flush, zolpidem   Vital Signs    Vitals:   01/19/17 1800 01/19/17 1915 01/19/17 2103 01/20/17 0130  BP: (!) 127/54 (!) 122/54 (!) 88/53 (!) 115/51  Pulse: 68 71 77 75  Resp: (!) 23 19 (!) 21   Temp:  98.6 F (37 C)    TempSrc:  Oral    SpO2: 93% 95% 96%   Weight:      Height:        Intake/Output Summary (Last 24 hours) at 01/20/17 0757 Last data filed at 01/19/17 2152  Gross per 24 hour  Intake             1203 ml  Output              800 ml  Net              403 ml   Filed Weights   01/19/17 0647  Weight: 155 lb (70.3 kg)    Telemetry    SR with PACs- Personally Reviewed  ECG    SR - Personally Reviewed  Physical Exam   GEN: No acute distress.   Neck: No JVD Cardiac: RRR, no murmurs, rubs, or gallops. R groin cath site with mild bruise.  Respiratory: Clear to auscultation bilaterally. GI: Soft, nontender, non-distended  MS: No edema; No deformity. Neuro:  Nonfocal  Psych: Normal affect   Labs    Chemistry Recent Labs Lab 01/19/17 0741 01/20/17 0444  NA 140 137  K 4.2 4.2  CL 107 106  CO2 28 25  GLUCOSE 97 83  BUN 15 10  CREATININE 0.86  0.76  CALCIUM 9.0 8.2*  GFRNONAA >60 >60  GFRAA >60 >60  ANIONGAP 5 6     Hematology Recent Labs Lab 01/19/17 0741 01/20/17 0444  WBC 6.7 10.4  RBC 4.72 3.97*  HGB 13.9 11.6*  HCT 42.9 35.5*  MCV 90.9 89.4  MCH 29.4 29.2  MCHC 32.4 32.7  RDW 14.5 14.3  PLT 160 137*   Radiology    No results found.  Cardiac Studies   CORONARY ATHERECTOMY - CSI  Conclusion     Ost 2nd Diag to 2nd Diag lesion, 30 %stenosed.  Ost 1st Diag to 1st Diag lesion, 0 %stenosed.  Mid LAD lesion, 95 %stenosed.  Post intervention, there is a 0% residual stenosis.  A stent was successfully placed.  A stent was successfully placed, and overlaps previously placed stent.  Prox LAD lesion, 60 %stenosed.  Post intervention, there is a 0% residual stenosis.   Successful complex angioplasty and 2 overlapped drug-eluting  stent placement to the mid and proximal LAD.  Very difficult procedure due to unusual takeoff of the left main coronary artery as well as heavy calcifications with inability to perform atherectomy due to presence of ostial first diagonal stent protruding into the LAD by 1 mm.  Recommendations: Dual antiplatelet therapy for at least one year. Hydration overnight and recheck renal function in the morning given excessive contrast use. will need to give precautions for excessive radiation exposure.  Fluoroscopy time was 45 minutes.   Diagnostic Diagram       Post-Intervention Diagram         Patient Profile     83 y.o. retired Development worker, [employer] with hx of CAD, pulmonary fibrosis, HLD, HTN and sleep apnea who recently evaluated by Dr. [name] for worsening exertional dyspnea and underwent  for right and left cardiac catheterization for a definitive diagnosis as described below:  1.  Severe one-vessel coronary artery disease with 95% heavily calcified stenosis in the mid LAD at the origin of second diagonal.  Patent stent in the first diagonal with no significant restenosis.   There is also moderate proximal LAD stenosis. 2.  Normal LV systolic function. 3.  Right heart catheterization showed normal filling pressures, minimal pulmonary hypertension and normal cardiac output. 4.  Saturation none showed evidence of a step up at the atrial level suggestive of ASD.  PA sat was 78%, RV sat was 77%, RA sat was 76% and SVC sat was 64%.  Recommendations: Recommend LAD PCI with atherectomy via the femoral approach given significant difficulty engaging the coronary vessels via the right radial artery due to right subclavian tortuosity. Start the patient on Plavix. Consider a transesophageal echocardiogram to evaluate for possible intracardiac shunting.  Assessment & Plan    1. CAD - S/p  Successful complex angioplasty and 2 overlapped drug-eluting stent placement to the mid and proximal LAD.  Fluoroscopy time was 45 minutes. - Continue ASA, Plavix, statin, BB. He is interested in cardiac rehab.   2. HTN - Stable on current medications  3. HLD - No results found for requested labs within last 8760 hours.  - Continue Crestor  For questions or updates, please contact [facility] Please consult [email or web address] for contact info under Cardiology/STEMI.      Signed, Manson Passey, PA  01/20/2017, 7:57 AM    Patient seen, examined. Available data reviewed. Agree with findings, assessment, and plan as outlined by Chelsea Aus, PA.  The patient is independently interviewed and examined.  Heart is regular rate and rhythm.  Lungs are clear.  Abdomen is soft and nontender.  The right groin site has no tenderness, but there is diffuse ecchymosis.  There is no firm hematoma.  There is no pretibial edema bilaterally.  The patient's cardiac cath study is reviewed.  His labs are within range except for very mild acute blood loss anemia with hemoglobin 11.6 mg/dL.  He appears stable for discharge.  His medications are reviewed as outlined above.  He will continue in cardiac rehab.   The patient will follow up with Dr. [name].  Tonny Bollman, M.D. 01/20/2017 8:24 AM

## 2017-01-20 NOTE — Telephone Encounter (Signed)
Patient in office c/o sob and wants to see Dr. Kirke CorinArida just had angio procedure yesterday   Laurita QuintSharon Y aware

## 2017-01-20 NOTE — Progress Notes (Signed)
CARDIAC REHAB PHASE I   PRE:  Rate/Rhythm: 72 SR  BP:  Sitting: 127/56      SaO2: 95 Supplemental O2 3L   MODE:  Ambulation: 500 ft   POST:  Rate/Rhythm: 87 SR  BP:  Sitting: 151/62      SaO2: 93 Supplemental O2 3L  Pt ambulated 500 feet at a steady gait. Pt used supplemental oxygen while walking on 3L. Pt denied complaints of CP. Pt became short of breath towards the end of the walk and was encouraged to do his pursed lip breathing. Pt was returned to recliner with call bell within reach. Ed was completed with wife at beside. Reviewed stent card, PTCA/Stents, anti-platelet therapy, restrictions, heart healthy diet, risk factors, exercise guidelines and CRPII. Pt is currently in pulmonary rehab. Pt and wife was open to CRPII at [facility]. Both verbalized understanding of education and that his rehab may change to cardiac. [facility].   [id number]  York Ceriseyara R Deryl Giroux MS, ACSM CEP  9:09 AM 01/20/2017

## 2017-01-21 ENCOUNTER — Encounter: Payer: Medicare Other | Attending: Internal Medicine

## 2017-01-21 DIAGNOSIS — Z955 Presence of coronary angioplasty implant and graft: Secondary | ICD-10-CM | POA: Insufficient documentation

## 2017-01-21 DIAGNOSIS — J8410 Pulmonary fibrosis, unspecified: Secondary | ICD-10-CM | POA: Insufficient documentation

## 2017-01-21 DIAGNOSIS — I10 Essential (primary) hypertension: Secondary | ICD-10-CM | POA: Insufficient documentation

## 2017-01-21 DIAGNOSIS — Z7982 Long term (current) use of aspirin: Secondary | ICD-10-CM | POA: Insufficient documentation

## 2017-01-21 DIAGNOSIS — G4730 Sleep apnea, unspecified: Secondary | ICD-10-CM | POA: Insufficient documentation

## 2017-01-21 DIAGNOSIS — E785 Hyperlipidemia, unspecified: Secondary | ICD-10-CM | POA: Insufficient documentation

## 2017-01-21 DIAGNOSIS — Z79899 Other long term (current) drug therapy: Secondary | ICD-10-CM | POA: Insufficient documentation

## 2017-01-21 DIAGNOSIS — I2510 Atherosclerotic heart disease of native coronary artery without angina pectoris: Secondary | ICD-10-CM | POA: Insufficient documentation

## 2017-01-21 DIAGNOSIS — Z7951 Long term (current) use of inhaled steroids: Secondary | ICD-10-CM | POA: Insufficient documentation

## 2017-01-21 LAB — ECHOCARDIOGRAM LIMITED

## 2017-01-24 ENCOUNTER — Encounter: Payer: Medicare Other | Admitting: *Deleted

## 2017-01-24 ENCOUNTER — Encounter: Payer: Self-pay | Admitting: *Deleted

## 2017-01-24 ENCOUNTER — Ambulatory Visit
Admission: RE | Admit: 2017-01-24 | Discharge: 2017-01-24 | Disposition: A | Discharge status: Home / Self Care | Payer: Medicare Other | Source: Ambulatory Visit | Attending: Internal Medicine | Admitting: Internal Medicine

## 2017-01-24 VITALS — Ht 66.1 in | Wt 155.6 lb

## 2017-01-24 DIAGNOSIS — Z7951 Long term (current) use of inhaled steroids: Secondary | ICD-10-CM | POA: Diagnosis not present

## 2017-01-24 DIAGNOSIS — I700 Atherosclerosis of aorta: Secondary | ICD-10-CM | POA: Insufficient documentation

## 2017-01-24 DIAGNOSIS — Z955 Presence of coronary angioplasty implant and graft: Secondary | ICD-10-CM | POA: Diagnosis not present

## 2017-01-24 DIAGNOSIS — R0602 Shortness of breath: Secondary | ICD-10-CM | POA: Diagnosis not present

## 2017-01-24 DIAGNOSIS — R059 Cough, unspecified: Secondary | ICD-10-CM

## 2017-01-24 DIAGNOSIS — D649 Anemia, unspecified: Secondary | ICD-10-CM | POA: Diagnosis not present

## 2017-01-24 DIAGNOSIS — E785 Hyperlipidemia, unspecified: Secondary | ICD-10-CM | POA: Diagnosis not present

## 2017-01-24 DIAGNOSIS — J8410 Pulmonary fibrosis, unspecified: Secondary | ICD-10-CM | POA: Diagnosis not present

## 2017-01-24 DIAGNOSIS — I2510 Atherosclerotic heart disease of native coronary artery without angina pectoris: Secondary | ICD-10-CM | POA: Diagnosis not present

## 2017-01-24 DIAGNOSIS — Z79899 Other long term (current) drug therapy: Secondary | ICD-10-CM | POA: Diagnosis not present

## 2017-01-24 DIAGNOSIS — R05 Cough: Secondary | ICD-10-CM

## 2017-01-24 DIAGNOSIS — G4730 Sleep apnea, unspecified: Secondary | ICD-10-CM | POA: Diagnosis not present

## 2017-01-24 DIAGNOSIS — I10 Essential (primary) hypertension: Secondary | ICD-10-CM | POA: Diagnosis not present

## 2017-01-24 DIAGNOSIS — Z7982 Long term (current) use of aspirin: Secondary | ICD-10-CM | POA: Diagnosis not present

## 2017-01-24 NOTE — Progress Notes (Signed)
Discharge Progress Report  Patient Details  Name: Vincent Kennedy MRN: [id number] Date of Birth: 10-01-1932 Referring Provider:     Pulmonary Re[facility]  Referring Provider  Freda MunroKhan, Saadat MD       Number of Visits: 8/36   Reason for Discharge:  Early Exit:  Personal and switched to Cardiac Rehab per doctors orders.  Smoking History:  Social History   Tobacco Use  Smoking Status Never Smoker  Smokeless Tobacco Never Used    Diagnosis:  Pulmonary fibrosis (HCC)  ADL UCSD: Pulmonary Assessment Scores    Row Name 12/20/16 1538         ADL UCSD   ADL Phase  Entry     SOB Score total  48     Rest  1     Walk  2     Stairs  3     Bath  1     Dress  0     Shop  2       CAT Score   CAT Score  22        Initial Exercise Prescription: Initial Exercise Prescription - 12/20/16 1600      Date of Initial Exercise RX and Referring Provider   Date  12/20/16    Referring Provider  Freda MunroKhan, Saadat MD      Treadmill   MPH  1.5    Grade  0.5    Minutes  15    METs  2.25      NuStep   Level  1    SPM  80    Minutes  15    METs  2      REL-XR   Level  1    Speed  50    Minutes  15    METs  2      Prescription Details   Frequency (times per week)  3    Duration  Progress to 45 minutes of aerobic exercise without signs/symptoms of physical distress      Intensity   THRR 40-80% of Max Heartrate  99-125    Ratings of Perceived Exertion  11-15    Perceived Dyspnea  0-4      Progression   Progression  Continue to progress workloads to maintain intensity without signs/symptoms of physical distress.      Resistance Training   Training Prescription  Yes    Weight  3 lbs    Reps  10-15       Discharge Exercise Prescription (Final Exercise Prescription Changes): Exercise Prescription Changes - 01/12/17 1500      Response to Exercise   Blood Pressure (Admit)  124/64    Blood Pressure (Exit)  124/64    Heart  Rate (Admit)  71 bpm    Heart Rate (Exercise)  94 bpm    Heart Rate (Exit)  63 bpm    Oxygen Saturation (Admit)  90 %    Oxygen Saturation (Exercise)  87 %    Oxygen Saturation (Exit)  98 %    Rating of Perceived Exertion (Exercise)  12    Perceived Dyspnea (Exercise)  1.5    Symptoms  none    Duration  Progress to 45 minutes of aerobic exercise without signs/symptoms of physical distress    Intensity  THRR unchanged      Progression   Progression  Continue to progress workloads to maintain intensity without signs/symptoms of physical distress.  Average METs  2.42      Resistance Training   Training Prescription  Yes    Weight  3 lbs    Reps  10-15      Interval Training   Interval Training  No      Oxygen   Oxygen  Continuous    Liters  3      Treadmill   MPH  2    Grade  0.5    Minutes  15    METs  2.67      NuStep   Level  4    SPM  114    Minutes  15    METs  2.3      REL-XR   Level  1    Speed  46    Minutes  15    METs  2.3       Functional Capacity: 6 Minute Walk    Row Name 12/20/16 1634         6 Minute Walk   Phase  Initial     Distance  760 feet     Walk Time  4.4 minutes stopped test due to desaturations     # of Rest Breaks  1 at end of test     MPH  1.96     METS  1.93     RPE  14     Perceived Dyspnea   3     VO2 Peak  6.76     Symptoms  Yes (comment)     Comments  SOB     Resting HR  73 bpm     Resting BP  126/64     Resting Oxygen Saturation   94 %     Exercise Oxygen Saturation  during 6 min walk  79 %     Max Ex. HR  119 bpm     Max Ex. BP  154/74     2 Minute Post BP  156/64 recheck 146/72       Interval HR   1 Minute HR  73     2 Minute HR  116     3 Minute HR  104     4 Minute HR  116     5 Minute HR  119 stopped at 4:24 due desaturation to 79%     2 Minute Post HR  80     Interval Heart Rate?  Yes       Interval Oxygen   Interval Oxygen?  Yes     Baseline Oxygen Saturation %  94 %     1 Minute Oxygen  Saturation %  92 %     1 Minute Liters of Oxygen  4 L pulsed     2 Minute Oxygen Saturation %  83 %     2 Minute Liters of Oxygen  4 L     3 Minute Oxygen Saturation %  81 %     3 Minute Liters of Oxygen  4 L     4 Minute Oxygen Saturation %  80 % 79% at 4:24     4 Minute Liters of Oxygen  4 L     2 Minute Post Oxygen Saturation %  92 %     2 Minute Post Liters of Oxygen  4 L        Psychological, QOL, Others - Outcomes: PHQ 2/9: Depression screen [facility] 2/9 12/20/2016 04/26/2016 12/24/2015 08/26/2015 04/22/2015  Decreased Interest 2  0 0 0 0  Down, Depressed, Hopeless 1 0 0 0 0  PHQ - 2 Score 3 0 0 0 0  Altered sleeping 0 - - - 0  Tired, decreased energy 1 - - - -  Change in appetite 1 - - - 0  Feeling bad or failure about yourself  1 - - - 0  Trouble concentrating 0 - - - 0  Moving slowly or fidgety/restless 0 - - - 3  Suicidal thoughts 0 - - - 0  PHQ-9 Score 6 - - - 3  Difficult doing work/chores Somewhat difficult - - - Somewhat difficult    Quality of Life:   Personal Goals: Goals established at orientation with interventions provided to work toward goal. Personal Goals and Risk Factors at Admission - 12/20/16 1550      Core Components/Risk Factors/Patient Goals on Admission    Weight Management  Yes;Weight Maintenance    Intervention  Weight Management: Develop a combined nutrition and exercise program designed to reach desired caloric intake, while maintaining appropriate intake of nutrient and fiber, sodium and fats, and appropriate energy expenditure required for the weight goal.;Weight Management: Provide education and appropriate resources to help participant work on and attain dietary goals.;Weight Management/Obesity: Establish reasonable short term and long term weight goals.    Admit Weight  156 lb (70.8 kg)    Goal Weight: Short Term  151 lb (68.5 kg)    Goal Weight: Long Term  151 lb (68.5 kg)    Expected Outcomes  Short Term: Continue to assess and modify  interventions until short term weight is achieved;Long Term: Adherence to nutrition and physical activity/exercise program aimed toward attainment of established weight goal;Weight Maintenance: Understanding of the daily nutrition guidelines, which includes 25-35% calories from fat, 7% or less cal from saturated fats, less than 200mg  cholesterol, less than 1.5gm of sodium, & 5 or more servings of fruits and vegetables daily;Understanding recommendations for meals to include 15-35% energy as protein, 25-35% energy from fat, 35-60% energy from carbohydrates, less than 200mg  of dietary cholesterol, 20-35 gm of total fiber daily;Understanding of distribution of calorie intake throughout the day with the consumption of 4-5 meals/snacks    Lipids  Yes    Intervention  Provide education and support for participant on nutrition & aerobic/resistive exercise along with prescribed medications to achieve LDL 70mg , HDL >40mg .    Expected Outcomes  Short Term: Participant states understanding of desired cholesterol values and is compliant with medications prescribed. Participant is following exercise prescription and nutrition guidelines.;Long Term: Cholesterol controlled with medications as prescribed, with individualized exercise RX and with personalized nutrition plan. Value goals: LDL < 70mg , HDL > 40 mg.    Stress  Yes    Intervention  Offer individual and/or small group education and counseling on adjustment to heart disease, stress management and health-related lifestyle change. Teach and support self-help strategies.;Refer participants experiencing significant psychosocial distress to appropriate mental health specialists for further evaluation and treatment. When possible, include family members and significant others in education/counseling sessions.    Expected Outcomes  Short Term: Participant demonstrates changes in health-related behavior, relaxation and other stress management skills, ability to obtain  effective social support, and compliance with psychotropic medications if prescribed.;Long Term: Emotional wellbeing is indicated by absence of clinically significant psychosocial distress or social isolation.        Personal Goals Discharge:   Exercise Goals and Review: Exercise Goals    Row Name 12/20/16 1654  Exercise Goals   Increase Physical Activity  Yes       Intervention  Provide advice, education, support and counseling about physical activity/exercise needs.;Develop an individualized exercise prescription for aerobic and resistive training based on initial evaluation findings, risk stratification, comorbidities and participant's personal goals.       Expected Outcomes  Achievement of increased cardiorespiratory fitness and enhanced flexibility, muscular endurance and strength shown through measurements of functional capacity and personal statement of participant.       Increase Strength and Stamina  Yes       Intervention  Provide advice, education, support and counseling about physical activity/exercise needs.;Develop an individualized exercise prescription for aerobic and resistive training based on initial evaluation findings, risk stratification, comorbidities and participant's personal goals.       Expected Outcomes  Achievement of increased cardiorespiratory fitness and enhanced flexibility, muscular endurance and strength shown through measurements of functional capacity and personal statement of participant.       Able to understand and use rate of perceived exertion (RPE) scale  Yes       Intervention  Provide education and explanation on how to use RPE scale       Expected Outcomes  Short Term: Able to use RPE daily in rehab to express subjective intensity level;Long Term:  Able to use RPE to guide intensity level when exercising independently       Able to understand and use Dyspnea scale  Yes       Intervention  Provide education and explanation on how to  use Dyspnea scale       Expected Outcomes  Short Term: Able to use Dyspnea scale daily in rehab to express subjective sense of shortness of breath during exertion;Long Term: Able to use Dyspnea scale to guide intensity level when exercising independently       Knowledge and understanding of Target Heart Rate Range (THRR)  Yes       Intervention  Provide education and explanation of THRR including how the numbers were predicted and where they are located for reference       Expected Outcomes  Short Term: Able to state/look up THRR;Long Term: Able to use THRR to govern intensity when exercising independently;Short Term: Able to use daily as guideline for intensity in rehab       Able to check pulse independently  Yes       Intervention  Provide education and demonstration on how to check pulse in carotid and radial arteries.;Review the importance of being able to check your own pulse for safety during independent exercise       Expected Outcomes  Long Term: Able to check pulse independently and accurately;Short Term: Able to explain why pulse checking is important during independent exercise       Understanding of Exercise Prescription  Yes       Intervention  Provide education, explanation, and written materials on patient's individual exercise prescription       Expected Outcomes  Short Term: Able to explain program exercise prescription;Long Term: Able to explain home exercise prescription to exercise independently          Nutrition & Weight - Outcomes: Pre Biometrics - 12/20/16 1657      Pre Biometrics   Height  5' 6.5" (1.689 m)    Weight  156 lb 6.4 oz (70.9 kg)    Waist Circumference  36.5 inches    Hip Circumference  37 inches    Waist to Hip Ratio  0.99 %    BMI (Calculated)  24.87        Nutrition: Nutrition Therapy & Goals - 12/20/16 1533      Nutrition Therapy   RD appointment defered  Yes      Intervention Plan   Intervention  Prescribe, educate and counsel regarding  individualized specific dietary modifications aiming towards targeted core components such as weight, hypertension, lipid management, diabetes, heart failure and other comorbidities.;Nutrition handout(s) given to patient.    Expected Outcomes  Short Term Goal: Understand basic principles of dietary content, such as calories, fat, sodium, cholesterol and nutrients.;Short Term Goal: A plan has been developed with personal nutrition goals set during dietitian appointment.;Long Term Goal: Adherence to prescribed nutrition plan.       Nutrition Discharge: Nutrition Assessments - 12/20/16 1534      MEDFICTS Scores   Pre Score  18       Education Questionnaire Score: Knowledge Questionnaire Score - 12/20/16 1538      Knowledge Questionnaire Score   Pre Score  9/10       Goals reviewed with patient; copy given to patient.

## 2017-01-24 NOTE — Patient Instructions (Signed)
Patient Instructions  Patient Details  Name: Vincent Kennedy MRN: [id number] Date of Birth: [birth year] Referring Provider:  Iran Ouch, MD  Below are the personal goals you chose as well as exercise and nutrition goals. Our goal is to help you keep on track towards obtaining and maintaining your goals. We will be discussing your progress on these goals with you throughout the program.  Initial Exercise Prescription: Initial Exercise Prescription - 01/24/17 1400      Date of Initial Exercise RX and Referring Provider   Date  01/24/17    Referring Provider  [name] MD      Oxygen   Oxygen  Continuous    Liters  4      Treadmill   MPH  1.5    Grade  0    Minutes  15    METs  2.15      NuStep   Level  1    SPM  80    Minutes  15    METs  1.7      Arm Ergometer   Level  1    RPM  25    Minutes  15    METs  1.7      Prescription Details   Frequency (times per week)  3    Duration  Progress to 45 minutes of aerobic exercise without signs/symptoms of physical distress      Intensity   THRR 40-80% of Max Heartrate  101-126    Ratings of Perceived Exertion  11-13    Perceived Dyspnea  0-4      Progression   Progression  Continue to progress workloads to maintain intensity without signs/symptoms of physical distress.      Resistance Training   Training Prescription  Yes    Weight  3 lbs    Reps  10-15       Exercise Goals: Frequency: Be able to perform aerobic exercise three times per week working toward 3-5 days per week.  Intensity: Work with a perceived exertion of 11 (fairly light) - 15 (hard) as tolerated. Follow your new exercise prescription and watch for changes in prescription as you progress with the program. Changes will be reviewed with you when they are made.  Duration: You should be able to do 30 minutes of continuous aerobic exercise in addition to a 5 minute warm-up and a 5 minute cool-down routine.  Nutrition Goals: Your personal  nutrition goals will be established when you do your nutrition analysis with the dietician.  The following are nutrition guidelines to follow: Cholesterol < 200mg /day Sodium < 1500mg /day Fiber: Men over 50 yrs - 30 grams per day  Personal Goals: Personal Goals and Risk Factors at Admission - 01/24/17 1335      Core Components/Risk Factors/Patient Goals on Admission    Weight Management  Yes;Weight Maintenance    Intervention  Weight Management: Develop a combined nutrition and exercise program designed to reach desired caloric intake, while maintaining appropriate intake of nutrient and fiber, sodium and fats, and appropriate energy expenditure required for the weight goal.;Weight Management: Provide education and appropriate resources to help participant work on and attain dietary goals.;Weight Management/Obesity: Establish reasonable short term and long term weight goals.    Admit Weight  155 lb 9.6 oz (70.6 kg)    Goal Weight: Short Term  150 lb (68 kg)    Goal Weight: Long Term  150 lb (68 kg)    Expected Outcomes  Weight Maintenance:  Understanding of the daily nutrition guidelines, which includes 25-35% calories from fat, 7% or less cal from saturated fats, less than 200mg  cholesterol, less than 1.5gm of sodium, & 5 or more servings of fruits and vegetables daily;Long Term: Adherence to nutrition and physical activity/exercise program aimed toward attainment of established weight goal;Short Term: Continue to assess and modify interventions until short term weight is achieved;Understanding recommendations for meals to include 15-35% energy as protein, 25-35% energy from fat, 35-60% energy from carbohydrates, less than 200mg  of dietary cholesterol, 20-35 gm of total fiber daily;Understanding of distribution of calorie intake throughout the day with the consumption of 4-5 meals/snacks    Improve shortness of breath with ADL's  Yes Patient would like to be able to walk and do things around the  house without getting so short of breath   Patient would like to be able to walk and do things around the house without getting so short of breath   Intervention  Provide education, individualized exercise plan and daily activity instruction to help decrease symptoms of SOB with activities of daily living.    Expected Outcomes  Short Term: Achieves a reduction of symptoms when performing activities of daily living.    Develop more efficient breathing techniques such as purse lipped breathing and diaphragmatic breathing; and practicing self-pacing with activity  Yes    Intervention  Provide education, demonstration and support about specific breathing techniuqes utilized for more efficient breathing. Include techniques such as pursed lipped breathing, diaphragmatic breathing and self-pacing activity.    Expected Outcomes  Short Term: Participant will be able to demonstrate and use breathing techniques as needed throughout daily activities.    Increase knowledge of respiratory medications and ability to use respiratory devices properly   Yes    Intervention  Provide education and demonstration as needed of appropriate use of medications, inhalers, and oxygen therapy.    Expected Outcomes  Short Term: Achieves understanding of medications use. Understands that oxygen is a medication prescribed by physician. Demonstrates appropriate use of inhaler and oxygen therapy.    Hypertension  Yes    Intervention  Provide education on lifestyle modifcations including regular physical activity/exercise, weight management, moderate sodium restriction and increased consumption of fresh fruit, vegetables, and low fat dairy, alcohol moderation, and smoking cessation.;Monitor prescription use compliance.    Expected Outcomes  Short Term: Continued assessment and intervention until BP is < 140/96mm HG in hypertensive participants. < 130/51mm HG in hypertensive participants with diabetes, heart failure or chronic kidney  disease.;Long Term: Maintenance of blood pressure at goal levels.    Lipids  Yes    Intervention  Provide education and support for participant on nutrition & aerobic/resistive exercise along with prescribed medications to achieve LDL 70mg , HDL >40mg .    Expected Outcomes  Short Term: Participant states understanding of desired cholesterol values and is compliant with medications prescribed. Participant is following exercise prescription and nutrition guidelines.;Long Term: Cholesterol controlled with medications as prescribed, with individualized exercise RX and with personalized nutrition plan. Value goals: LDL < 70mg , HDL > 40 mg.    Stress  Yes    Intervention  Offer individual and/or small group education and counseling on adjustment to heart disease, stress management and health-related lifestyle change. Teach and support self-help strategies.;Refer participants experiencing significant psychosocial distress to appropriate mental health specialists for further evaluation and treatment. When possible, include family members and significant others in education/counseling sessions.    Expected Outcomes  Short Term: Participant demonstrates changes in health-related behavior,  relaxation and other stress management skills, ability to obtain effective social support, and compliance with psychotropic medications if prescribed.;Long Term: Emotional wellbeing is indicated by absence of clinically significant psychosocial distress or social isolation.       Tobacco Use Initial Evaluation: Social History   Tobacco Use  Smoking Status Never Smoker  Smokeless Tobacco Never Used    Exercise Goals and Review: Exercise Goals    Row Name 12/20/16 1654 01/24/17 1402           Exercise Goals   Increase Physical Activity  Yes  Yes      Intervention  Provide advice, education, support and counseling about physical activity/exercise needs.;Develop an individualized exercise prescription for aerobic and  resistive training based on initial evaluation findings, risk stratification, comorbidities and participant's personal goals.  Provide advice, education, support and counseling about physical activity/exercise needs.;Develop an individualized exercise prescription for aerobic and resistive training based on initial evaluation findings, risk stratification, comorbidities and participant's personal goals.      Expected Outcomes  Achievement of increased cardiorespiratory fitness and enhanced flexibility, muscular endurance and strength shown through measurements of functional capacity and personal statement of participant.  Achievement of increased cardiorespiratory fitness and enhanced flexibility, muscular endurance and strength shown through measurements of functional capacity and personal statement of participant.      Increase Strength and Stamina  Yes  Yes      Intervention  Provide advice, education, support and counseling about physical activity/exercise needs.;Develop an individualized exercise prescription for aerobic and resistive training based on initial evaluation findings, risk stratification, comorbidities and participant's personal goals.  Provide advice, education, support and counseling about physical activity/exercise needs.;Develop an individualized exercise prescription for aerobic and resistive training based on initial evaluation findings, risk stratification, comorbidities and participant's personal goals.      Expected Outcomes  Achievement of increased cardiorespiratory fitness and enhanced flexibility, muscular endurance and strength shown through measurements of functional capacity and personal statement of participant.  Achievement of increased cardiorespiratory fitness and enhanced flexibility, muscular endurance and strength shown through measurements of functional capacity and personal statement of participant.      Able to understand and use rate of perceived exertion (RPE) scale   Yes  Yes      Intervention  Provide education and explanation on how to use RPE scale  Provide education and explanation on how to use RPE scale      Expected Outcomes  Short Term: Able to use RPE daily in rehab to express subjective intensity level;Long Term:  Able to use RPE to guide intensity level when exercising independently  Short Term: Able to use RPE daily in rehab to express subjective intensity level;Long Term:  Able to use RPE to guide intensity level when exercising independently      Able to understand and use Dyspnea scale  Yes  Yes      Intervention  Provide education and explanation on how to use Dyspnea scale  Provide education and explanation on how to use Dyspnea scale      Expected Outcomes  Short Term: Able to use Dyspnea scale daily in rehab to express subjective sense of shortness of breath during exertion;Long Term: Able to use Dyspnea scale to guide intensity level when exercising independently  Short Term: Able to use Dyspnea scale daily in rehab to express subjective sense of shortness of breath during exertion;Long Term: Able to use Dyspnea scale to guide intensity level when exercising independently      Knowledge  and understanding of Target Heart Rate Range (THRR)  Yes  Yes      Intervention  Provide education and explanation of THRR including how the numbers were predicted and where they are located for reference  Provide education and explanation of THRR including how the numbers were predicted and where they are located for reference      Expected Outcomes  Short Term: Able to state/look up THRR;Long Term: Able to use THRR to govern intensity when exercising independently;Short Term: Able to use daily as guideline for intensity in rehab  Short Term: Able to state/look up THRR;Long Term: Able to use THRR to govern intensity when exercising independently;Short Term: Able to use daily as guideline for intensity in rehab      Able to check pulse independently  Yes  Yes       Intervention  Provide education and demonstration on how to check pulse in carotid and radial arteries.;Review the importance of being able to check your own pulse for safety during independent exercise  Provide education and demonstration on how to check pulse in carotid and radial arteries.;Review the importance of being able to check your own pulse for safety during independent exercise      Expected Outcomes  Long Term: Able to check pulse independently and accurately;Short Term: Able to explain why pulse checking is important during independent exercise  Long Term: Able to check pulse independently and accurately;Short Term: Able to explain why pulse checking is important during independent exercise      Understanding of Exercise Prescription  Yes  Yes      Intervention  Provide education, explanation, and written materials on patient's individual exercise prescription  Provide education, explanation, and written materials on patient's individual exercise prescription      Expected Outcomes  Short Term: Able to explain program exercise prescription;Long Term: Able to explain home exercise prescription to exercise independently  Short Term: Able to explain program exercise prescription;Long Term: Able to explain home exercise prescription to exercise independently         Copy of goals given to participant.

## 2017-01-24 NOTE — Progress Notes (Signed)
Daily Session Note  Patient Details  Name: Vincent Kennedy MRN: [id number] Date of Birth: May 11, 1932 Referring Provider:     Cardiac Rehab from 01/24/2017 in [facility]  Referring Provider  Kathlyn Sacramento MD      Encounter Date: 01/24/2017  Check In: Session Check In - 01/24/17 1322      Check-In   Location  [facility]    Staff Present  Darel Hong, RN BSN;Jessica Luan Pulling, MA, ACSM RCEP, Exercise Physiologist    Supervising physician immediately available to respond to emergencies  See telemetry face sheet for immediately available ER MD    Medication changes reported      No    Fall or balance concerns reported     No    Tobacco Cessation  No Change    Warm-up and Cool-down  Performed as group-led instruction    Resistance Training Performed  Yes    VAD Patient?  No      Pain Assessment   Currently in Pain?  No/denies    Pain Score  0-No pain    Multiple Pain Sites  No        Exercise Prescription Changes - 01/24/17 1400      Response to Exercise   Blood Pressure (Admit)  148/84    Blood Pressure (Exercise)  152/64    Blood Pressure (Exit)  144/70    Heart Rate (Admit)  77 bpm    Heart Rate (Exercise)  122 bpm    Heart Rate (Exit)  66 bpm    Oxygen Saturation (Admit)  97 %    Oxygen Saturation (Exercise)  73 %    Oxygen Saturation (Exit)  90 %    Rating of Perceived Exertion (Exercise)  13    Perceived Dyspnea (Exercise)  2    Symptoms  none    Comments  walk test results       Social History   Tobacco Use  Smoking Status Never Smoker  Smokeless Tobacco Never Used    Goals Met:  Independence with exercise equipment Exercise tolerated well No report of cardiac concerns or symptoms Strength training completed today  Goals Unmet:  Not Applicable  Comments: Initial Med Review and 6MW test completed.   Dr. Emily Filbert is Medical Director for [name].

## 2017-01-24 NOTE — Progress Notes (Signed)
Pulmonary Individual Treatment Plan  Patient Details  Name: Vincent Kennedy MRN: [id number] Date of Birth: 1932/06/02 Referring Provider:     Pulmonary Rehab from 12/20/2016 in [facility] Cardiac and Pulmonary Rehab  Referring Provider  Freda Munro MD      Initial Encounter Date:    Pulmonary Rehab from 12/20/2016 in [facility] Cardiac and Pulmonary Rehab  Date  12/20/16  Referring Provider  Freda Munro MD      Visit Diagnosis: Pulmonary fibrosis (HCC)  Patient's Home Medications on Admission:  Current Outpatient Medications:  .  albuterol (PROVENTIL HFA;VENTOLIN HFA) 108 (90 Base) MCG/ACT inhaler, Inhale 2 puffs into the lungs every 6 (six) hours as needed for wheezing or shortness of breath., Disp: 1 Inhaler, Rfl: 2 .  aspirin EC 81 MG tablet, Take 1 tablet (81 mg total) by mouth daily., Disp: 30 tablet, Rfl: 6 .  budesonide (PULMICORT) 0.5 MG/2ML nebulizer solution, Take 2 mLs (0.5 mg total) by nebulization 2 (two) times daily., Disp: 2 mL, Rfl: 12 .  carvedilol (COREG) 6.25 MG tablet, Take 1 tablet (6.25 mg total) by mouth 2 (two) times daily., Disp: 60 tablet, Rfl: 6 .  clopidogrel (PLAVIX) 75 MG tablet, Take 1 tablet (75 mg total) by mouth daily., Disp: 30 tablet, Rfl: 6 .  finasteride (PROSCAR) 5 MG tablet, Take 1 tablet (5 mg total) by mouth daily., Disp: 90 tablet, Rfl: 1 .  mirtazapine (REMERON) 15 MG tablet, Take 1 tablet (15 mg total) by mouth at bedtime., Disp: 30 tablet, Rfl: 11 .  mometasone-formoterol (DULERA) 100-5 MCG/ACT AERO, Inhale 2 puffs into the lungs 2 (two) times daily., Disp: 1 Inhaler, Rfl: 1 .  nitroGLYCERIN (NITROSTAT) 0.4 MG SL tablet, Place 1 tablet (0.4 mg total) under the tongue every 5 (five) minutes as needed for chest pain., Disp: 25 tablet, Rfl: 12 .  pantoprazole (PROTONIX) 40 MG tablet, Take 1 tablet (40 mg total) by mouth daily., Disp: 30 tablet, Rfl: 6 .  Pirfenidone (ESBRIET) 267 MG CAPS, Take 801 mg by mouth 3 (three) times daily. , Disp: , Rfl:  .   rivastigmine (EXELON) 9.5 mg/24hr, Place 1 patch (9.5 mg total) onto the skin daily. (Patient taking differently: Place 9.5 mg onto the skin once a week. ), Disp: 30 patch, Rfl: 2 .  rosuvastatin (CRESTOR) 40 MG tablet, TAKE ONE TABLET EVERY DAY (Patient taking differently: Take 40 mg by mouth every day), Disp: 90 tablet, Rfl: 2 .  zolpidem (AMBIEN) 5 MG tablet, Take 5 mg by mouth at bedtime as needed for sleep. , Disp: , Rfl:   Past Medical History: Past Medical History:  Diagnosis Date  . Abnormal nuclear cardiac imaging test    Dr. [name]  . BPH (benign prostatic hyperplasia)   . Coronary artery disease    Cardiac cath September 2008: 20% left main stenosis, 40% mid LAD stenosis, 99% ostial first diagonal stenosis in a large branch and 90% ostial disease in second diagonal but the vessel was very small, 20% mid RCA stenosis with normal ejection fraction. Successful angioplasty and drug-eluting stent placement to the ostial first diagonal with a 2.5 x 15 mm Xience stent  . GERD (gastroesophageal reflux disease)   . Hyperlipidemia   . Hypertension    BP has never been high(per pt).  Metoprolol is to keep BP low because of stent (afterload)  . On home oxygen therapy    "2-3L; 24/7" (01/19/2017)  . OSA on CPAP   . Oxygen deficiency   . Pneumonia  07/2016  . Pulmonary fibrosis (HCC) 08/26/2015  . Shortness of breath dyspnea    with exertion  . Wears hearing aid    (sometimes)    Tobacco Use: Social History   Tobacco Use  Smoking Status Never Smoker  Smokeless Tobacco Never Used    Labs: Recent Review Flowsheet Data    Labs for ITP Cardiac and Pulmonary Rehab Latest Ref Rng & Units 01/12/2017 01/12/2017 01/12/2017 01/12/2017 01/12/2017   Cholestrol 125 - 200 mg/dL - - - - -   LDLCALC <284 mg/dL - - - - -   HDL >=13 mg/dL - - - - -   Trlycerides <150 mg/dL - - - - -   PHART 2.440 - 7.450 - 7.378 - - -   PCO2ART 32.0 - 48.0 mmHg - 41.3 - - -   HCO3 20.0 - 28.0 mmol/L 23.9 24.3  23.8 24.1 25.3   TCO2 22 - 32 mmol/L 25 26 25 25 27    ACIDBASEDEF 0.0 - 2.0 mmol/L 2.0 1.0 2.0 1.0 1.0   O2SAT % 78.0 98.0 77.0 76.0 64.0       Pulmonary Assessment Scores: Pulmonary Assessment Scores    Row Name 12/20/16 1538         ADL UCSD   ADL Phase  Entry     SOB Score total  48     Rest  1     Walk  2     Stairs  3     Bath  1     Dress  0     Shop  2       CAT Score   CAT Score  22        Pulmonary Function Assessment:   Exercise Target Goals:    Exercise Program Goal: Individual exercise prescription set with THRR, safety & activity barriers. Participant demonstrates ability to understand and report RPE using BORG scale, to self-measure pulse accurately, and to acknowledge the importance of the exercise prescription.  Exercise Prescription Goal: Starting with aerobic activity 30 plus minutes a day, 3 days per week for initial exercise prescription. Provide home exercise prescription and guidelines that participant acknowledges understanding prior to discharge.  Activity Barriers & Risk Stratification: Activity Barriers & Cardiac Risk Stratification - 12/20/16 1647      Activity Barriers & Cardiac Risk Stratification   Activity Barriers  Deconditioning;Muscular Weakness;Shortness of Breath;Balance Concerns;History of Falls;Assistive Device       6 Minute Walk: 6 Minute Walk    Row Name 12/20/16 1634         6 Minute Walk   Phase  Initial     Distance  760 feet     Walk Time  4.4 minutes stopped test due to desaturations     # of Rest Breaks  1 at end of test     MPH  1.96     METS  1.93     RPE  14     Perceived Dyspnea   3     VO2 Peak  6.76     Symptoms  Yes (comment)     Comments  SOB     Resting HR  73 bpm     Resting BP  126/64     Resting Oxygen Saturation   94 %     Exercise Oxygen Saturation  during 6 min walk  79 %     Max Ex. HR  119 bpm     Max Ex. BP  154/74     2 Minute Post BP  156/64 recheck 146/72       Interval HR    1 Minute HR  73     2 Minute HR  116     3 Minute HR  104     4 Minute HR  116     5 Minute HR  119 stopped at 4:24 due desaturation to 79%     2 Minute Post HR  80     Interval Heart Rate?  Yes       Interval Oxygen   Interval Oxygen?  Yes     Baseline Oxygen Saturation %  94 %     1 Minute Oxygen Saturation %  92 %     1 Minute Liters of Oxygen  4 L pulsed     2 Minute Oxygen Saturation %  83 %     2 Minute Liters of Oxygen  4 L     3 Minute Oxygen Saturation %  81 %     3 Minute Liters of Oxygen  4 L     4 Minute Oxygen Saturation %  80 % 79% at 4:24     4 Minute Liters of Oxygen  4 L     2 Minute Post Oxygen Saturation %  92 %     2 Minute Post Liters of Oxygen  4 L       Oxygen Initial Assessment: Oxygen Initial Assessment - 12/20/16 1540      Home Oxygen   Home Oxygen Device  Portable Concentrator;Home Concentrator;E-Tanks    Sleep Oxygen Prescription  CPAP    Liters per minute  2.5    Home Exercise Oxygen Prescription  Continuous    Liters per minute  4    Home at Rest Exercise Oxygen Prescription  Continuous    Liters per minute  2.5    Compliance with Home Oxygen Use  Yes      Initial 6 min Walk   Oxygen Used  Continuous    Liters per minute  4      Program Oxygen Prescription   Program Oxygen Prescription  Continuous    Liters per minute  4      Intervention   Short Term Goals  To learn and exhibit compliance with exercise, home and travel O2 prescription;To learn and understand importance of maintaining oxygen saturations>88%;To learn and demonstrate proper use of respiratory medications;To learn and demonstrate proper pursed lip breathing techniques or other breathing techniques.;To learn and understand importance of monitoring SPO2 with pulse oximeter and demonstrate accurate use of the pulse oximeter.    Long  Term Goals  Verbalizes importance of monitoring SPO2 with pulse oximeter and return demonstration;Exhibits proper breathing techniques, such as  pursed lip breathing or other method taught during program session;Demonstrates proper use of MDI's;Compliance with respiratory medication;Maintenance of O2 saturations>88%;Exhibits compliance with exercise, home and travel O2 prescription       Oxygen Re-Evaluation:   Oxygen Discharge (Final Oxygen Re-Evaluation):   Initial Exercise Prescription: Initial Exercise Prescription - 12/20/16 1600      Date of Initial Exercise RX and Referring Provider   Date  12/20/16    Referring Provider  Freda Munro MD      Treadmill   MPH  1.5    Grade  0.5    Minutes  15    METs  2.25      NuStep   Level  1  SPM  80    Minutes  15    METs  2      REL-XR   Level  1    Speed  50    Minutes  15    METs  2      Prescription Details   Frequency (times per week)  3    Duration  Progress to 45 minutes of aerobic exercise without signs/symptoms of physical distress      Intensity   THRR 40-80% of Max Heartrate  99-125    Ratings of Perceived Exertion  11-15    Perceived Dyspnea  0-4      Progression   Progression  Continue to progress workloads to maintain intensity without signs/symptoms of physical distress.      Resistance Training   Training Prescription  Yes    Weight  3 lbs    Reps  10-15       Perform Capillary Blood Glucose checks as needed.  Exercise Prescription Changes: Exercise Prescription Changes    Row Name 12/20/16 1600 12/28/16 1600 01/12/17 1500         Response to Exercise   Blood Pressure (Admit)  126/64  128/54  124/64     Blood Pressure (Exercise)  154/74  130/80  -     Blood Pressure (Exit)  156/64 recheck 146/72  122/64  124/64     Heart Rate (Admit)  73 bpm  74 bpm  71 bpm     Heart Rate (Exercise)  120 bpm  92 bpm  94 bpm     Heart Rate (Exit)  80 bpm  72 bpm  63 bpm     Oxygen Saturation (Admit)  94 %  96 %  90 %     Oxygen Saturation (Exercise)  79 %  87 %  87 %     Oxygen Saturation (Exit)  92 %  99 %  98 %     Rating of Perceived  Exertion (Exercise)  14  11  12      Perceived Dyspnea (Exercise)  3  1  1.5     Symptoms  SOB  none  none     Comments  walk test results  second full day of exercise  -     Duration  -  Progress to 45 minutes of aerobic exercise without signs/symptoms of physical distress  Progress to 45 minutes of aerobic exercise without signs/symptoms of physical distress     Intensity  -  THRR unchanged  THRR unchanged       Progression   Progression  -  Continue to progress workloads to maintain intensity without signs/symptoms of physical distress.  Continue to progress workloads to maintain intensity without signs/symptoms of physical distress.     Average METs  -  2.25  2.42       Resistance Training   Training Prescription  -  Yes  Yes     Weight  -  3 lbs  3 lbs     Reps  -  10-15  10-15       Interval Training   Interval Training  -  No  No       Oxygen   Oxygen  -  Continuous  Continuous     Liters  -  3  3       Treadmill   MPH  -  1.5  2     Grade  -  0.5  0.5  Minutes  -  1  15     METs  -  2.25  2.67       NuStep   Level  -  2  4     SPM  -  100  114     Minutes  -  15  15     METs  -  1.9  2.3       REL-XR   Level  -  1  1     Speed  -  44  46     Minutes  -  15  15     METs  -  2.6  2.3        Exercise Comments: Exercise Comments    Row Name 12/24/16 1135           Exercise Comments  First full day of exercise!  Patient was oriented to gym and equipment including functions, settings, policies, and procedures.  Patient's individual exercise prescription and treatment plan were reviewed.  All starting workloads were established based on the results of the 6 minute walk test done at initial orientation visit.  The plan for exercise progression was also introduced and progression will be customized based on patient's performance and goals          Exercise Goals and Review: Exercise Goals    Row Name 12/20/16 1654             Exercise Goals   Increase  Physical Activity  Yes       Intervention  Provide advice, education, support and counseling about physical activity/exercise needs.;Develop an individualized exercise prescription for aerobic and resistive training based on initial evaluation findings, risk stratification, comorbidities and participant's personal goals.       Expected Outcomes  Achievement of increased cardiorespiratory fitness and enhanced flexibility, muscular endurance and strength shown through measurements of functional capacity and personal statement of participant.       Increase Strength and Stamina  Yes       Intervention  Provide advice, education, support and counseling about physical activity/exercise needs.;Develop an individualized exercise prescription for aerobic and resistive training based on initial evaluation findings, risk stratification, comorbidities and participant's personal goals.       Expected Outcomes  Achievement of increased cardiorespiratory fitness and enhanced flexibility, muscular endurance and strength shown through measurements of functional capacity and personal statement of participant.       Able to understand and use rate of perceived exertion (RPE) scale  Yes       Intervention  Provide education and explanation on how to use RPE scale       Expected Outcomes  Short Term: Able to use RPE daily in rehab to express subjective intensity level;Long Term:  Able to use RPE to guide intensity level when exercising independently       Able to understand and use Dyspnea scale  Yes       Intervention  Provide education and explanation on how to use Dyspnea scale       Expected Outcomes  Short Term: Able to use Dyspnea scale daily in rehab to express subjective sense of shortness of breath during exertion;Long Term: Able to use Dyspnea scale to guide intensity level when exercising independently       Knowledge and understanding of Target Heart Rate Range (THRR)  Yes       Intervention  Provide education  and explanation of THRR including how the numbers were  predicted and where they are located for reference       Expected Outcomes  Short Term: Able to state/look up THRR;Long Term: Able to use THRR to govern intensity when exercising independently;Short Term: Able to use daily as guideline for intensity in rehab       Able to check pulse independently  Yes       Intervention  Provide education and demonstration on how to check pulse in carotid and radial arteries.;Review the importance of being able to check your own pulse for safety during independent exercise       Expected Outcomes  Long Term: Able to check pulse independently and accurately;Short Term: Able to explain why pulse checking is important during independent exercise       Understanding of Exercise Prescription  Yes       Intervention  Provide education, explanation, and written materials on patient's individual exercise prescription       Expected Outcomes  Short Term: Able to explain program exercise prescription;Long Term: Able to explain home exercise prescription to exercise independently          Exercise Goals Re-Evaluation : Exercise Goals Re-Evaluation    Row Name 12/28/16 1611 01/12/17 1534           Exercise Goal Re-Evaluation   Exercise Goals Review  Increase Strength and Stamina;Increase Physical Activity  Increase Strength and Stamina;Increase Physical Activity      Comments  Vincent Kennedy has completed two full days of exercise in rehab.  He is off to a good start.  We will continue to monitor his progression.   Vincent Kennedy has continued to do well in rehab.  He has been averaging about two days a week.  He would get more out of it if his attendance was better.  He is up to level 4 on the NuStep.  We will continue to monitor his progress.      Expected Outcomes  Short: Attend rehab classes regularly.  Long: Complete rehab in full this time.   Short: Attend class more regularly.  Long: Exercise at home independently.           Discharge Exercise Prescription (Final Exercise Prescription Changes): Exercise Prescription Changes - 01/12/17 1500      Response to Exercise   Blood Pressure (Admit)  124/64    Blood Pressure (Exit)  124/64    Heart Rate (Admit)  71 bpm    Heart Rate (Exercise)  94 bpm    Heart Rate (Exit)  63 bpm    Oxygen Saturation (Admit)  90 %    Oxygen Saturation (Exercise)  87 %    Oxygen Saturation (Exit)  98 %    Rating of Perceived Exertion (Exercise)  12    Perceived Dyspnea (Exercise)  1.5    Symptoms  none    Duration  Progress to 45 minutes of aerobic exercise without signs/symptoms of physical distress    Intensity  THRR unchanged      Progression   Progression  Continue to progress workloads to maintain intensity without signs/symptoms of physical distress.    Average METs  2.42      Resistance Training   Training Prescription  Yes    Weight  3 lbs    Reps  10-15      Interval Training   Interval Training  No      Oxygen   Oxygen  Continuous    Liters  3      Treadmill  MPH  2    Grade  0.5    Minutes  15    METs  2.67      NuStep   Level  4    SPM  114    Minutes  15    METs  2.3      REL-XR   Level  1    Speed  46    Minutes  15    METs  2.3       Nutrition:  Target Goals: Understanding of nutrition guidelines, daily intake of sodium 1500mg , cholesterol 200mg , calories 30% from fat and 7% or less from saturated fats, daily to have 5 or more servings of fruits and vegetables.  Biometrics: Pre Biometrics - 12/20/16 1657      Pre Biometrics   Height  5' 6.5" (1.689 m)    Weight  156 lb 6.4 oz (70.9 kg)    Waist Circumference  36.5 inches    Hip Circumference  37 inches    Waist to Hip Ratio  0.99 %    BMI (Calculated)  24.87        Nutrition Therapy Plan and Nutrition Goals: Nutrition Therapy & Goals - 12/20/16 1533      Nutrition Therapy   RD appointment defered  Yes      Intervention Plan   Intervention  Prescribe, educate and  counsel regarding individualized specific dietary modifications aiming towards targeted core components such as weight, hypertension, lipid management, diabetes, heart failure and other comorbidities.;Nutrition handout(s) given to patient.    Expected Outcomes  Short Term Goal: Understand basic principles of dietary content, such as calories, fat, sodium, cholesterol and nutrients.;Short Term Goal: A plan has been developed with personal nutrition goals set during dietitian appointment.;Long Term Goal: Adherence to prescribed nutrition plan.       Nutrition Discharge: Rate Your Plate Scores: Nutrition Assessments - 12/20/16 1534      MEDFICTS Scores   Pre Score  18       Nutrition Goals Re-Evaluation:   Nutrition Goals Discharge (Final Nutrition Goals Re-Evaluation):   Psychosocial: Target Goals: Acknowledge presence or absence of significant depression and/or stress, maximize coping skills, provide positive support system. Participant is able to verbalize types and ability to use techniques and skills needed for reducing stress and depression.   Initial Review & Psychosocial Screening: Initial Psych Review & Screening - 12/20/16 1530      Initial Review   Current issues with  Current Depression;Current Stress Concerns    Source of Stress Concerns  Chronic Illness;Family    Comments  Feels like he cant take of the family as he once did.      Family Dynamics   Good Support System?  Yes      Barriers   Psychosocial barriers to participate in program  The patient should benefit from training in stress management and relaxation.      Screening Interventions   Interventions  Program counselor consult;Encouraged to exercise;Yes;Provide feedback about the scores to participant    Expected Outcomes  Short Term goal: Utilizing psychosocial counselor, staff and physician to assist with identification of specific Stressors or current issues interfering with healing process. Setting desired  goal for each stressor or current issue identified.;Long Term Goal: Stressors or current issues are controlled or eliminated.;Short Term goal: Identification and review with participant of any Quality of Life or Depression concerns found by scoring the questionnaire.;Long Term goal: The participant improves quality of Life and PHQ9 Scores as  seen by post scores and/or verbalization of changes       Quality of Life Scores:   PHQ-9: Recent Review Flowsheet Data    Depression screen [facility] 2/9 12/20/2016 04/26/2016 12/24/2015 08/26/2015 04/22/2015   Decreased Interest 2 0 0 0 0   Down, Depressed, Hopeless 1 0 0 0 0   PHQ - 2 Score 3 0 0 0 0   Altered sleeping 0 - - - 0   Tired, decreased energy 1 - - - -   Change in appetite 1 - - - 0   Feeling bad or failure about yourself  1 - - - 0   Trouble concentrating 0 - - - 0   Moving slowly or fidgety/restless 0 - - - 3   Suicidal thoughts 0 - - - 0   PHQ-9 Score 6 - - - 3   Difficult doing work/chores Somewhat difficult - - - Somewhat difficult     Interpretation of Total Score  Total Score Depression Severity:  1-4 = Minimal depression, 5-9 = Mild depression, 10-14 = Moderate depression, 15-19 = Moderately severe depression, 20-27 = Severe depression   Psychosocial Evaluation and Intervention:   Psychosocial Re-Evaluation:   Psychosocial Discharge (Final Psychosocial Re-Evaluation):   Education: Education Goals: Education classes will be provided on a weekly basis, covering required topics. Participant will state understanding/return demonstration of topics presented.  Learning Barriers/Preferences: Learning Barriers/Preferences - 12/20/16 1554      Learning Barriers/Preferences   Learning Barriers  None    Learning Preferences  None       Education Topics: Initial Evaluation Education: - Verbal, written and demonstration of respiratory meds, RPE/PD scales, oximetry and breathing techniques. Instruction on use of nebulizers and MDIs:  cleaning and proper use, rinsing mouth with steroid doses and importance of monitoring MDI activations.   Pulmonary Rehab from 01/10/2017 in [facility] Cardiac and Pulmonary Rehab  Date  12/20/16  Educator  [facility]  Instruction Review Code  1- Verbalizes Understanding      General Nutrition Guidelines/Fats and Fiber: -Group instruction provided by verbal, written material, models and posters to present the general guidelines for heart healthy nutrition. Gives an explanation and review of dietary fats and fiber.   Pulmonary Rehab from 01/10/2017 in [facility] Cardiac and Pulmonary Rehab  Date  01/03/17  Educator  CR  Instruction Review Code  1- Verbalizes Understanding      Controlling Sodium/Reading Food Labels: -Group verbal and written material supporting the discussion of sodium use in heart healthy nutrition. Review and explanation with models, verbal and written materials for utilization of the food label.   Pulmonary Rehab from 01/10/2017 in [facility] Cardiac and Pulmonary Rehab  Date  01/10/17  Educator  CR  Instruction Review Code  1- Verbalizes Understanding      Exercise Physiology & Risk Factors: - Group verbal and written instruction with models to review the exercise physiology of the cardiovascular system and associated critical values. Details cardiovascular disease risk factors and the goals associated with each risk factor.   Pulmonary Rehab from 01/10/2017 in [facility] Cardiac and Pulmonary Rehab  Date  12/24/16  Educator  Roney Jaffe, EP  Instruction Review Code  2- Demonstrated Understanding      Aerobic Exercise & Resistance Training: - Gives group verbal and written discussion on the health impact of inactivity. On the components of aerobic and resistive training programs and the benefits of this training and how to safely progress through these programs.   Flexibility, Balance, General  Exercise Guidelines: - Provides group verbal and written instruction on the benefits of  flexibility and balance training programs. Provides general exercise guidelines with specific guidelines to those with heart or lung disease. Demonstration and skill practice provided.   Pulmonary Rehab from 07/28/2015 in [facility] Cardiac and Pulmonary Rehab  Date  05/07/15  Educator  RM  Instruction Review Code (retired)  2- meets goals/outcomes      Stress Management: - Provides group verbal and written instruction about the health risks of elevated stress, cause of high stress, and healthy ways to reduce stress.   Pulmonary Rehab from 07/28/2015 in [facility] Cardiac and Pulmonary Rehab  Date  05/14/15  Educator  [facility]  Instruction Review Code (retired)  2- meets goals/outcomes      Depression: - Provides group verbal and written instruction on the correlation between heart/lung disease and depressed mood, treatment options, and the stigmas associated with seeking treatment.   Exercise & Equipment Safety: - Individual verbal instruction and demonstration of equipment use and safety with use of the equipment.   Pulmonary Rehab from 01/10/2017 in [facility] Cardiac and Pulmonary Rehab  Date  12/20/16  Educator  [facility]  Instruction Review Code  1- Verbalizes Understanding      Infection Prevention: - Provides verbal and written material to individual with discussion of infection control including proper hand washing and proper equipment cleaning during exercise session.   Pulmonary Rehab from 01/10/2017 in [facility]  Cardiac and Pulmonary Rehab  Date  12/20/16  Educator  [facility]  Instruction Review Code  1- Verbalizes Understanding      Falls Prevention: - Provides verbal and written material to individual with discussion of falls prevention and safety.   Pulmonary Rehab from 01/10/2017 in [facility] Cardiac and Pulmonary Rehab  Date  12/20/16  Educator  [facility]  Instruction Review Code  1- Verbalizes Understanding      Diabetes: - Individual verbal and written instruction to review signs/symptoms of diabetes,  desired ranges of glucose level fasting, after meals and with exercise. Advice that pre and post exercise glucose checks will be done for 3 sessions at entry of program.   Chronic Lung Diseases: - Group verbal and written instruction to review new updates, new respiratory medications, new advancements in procedures and treatments. Provide informative websites and "800" numbers of self-education.   Pulmonary Rehab from 07/28/2015 in [facility] Cardiac and Pulmonary Rehab  Date  07/28/15  Educator  [facility]  Instruction Review Code (retired)  2- meets goals/outcomes      Lung Procedures: - Group verbal and written instruction to describe testing methods done to diagnose lung disease. Review the outcome of test results. Describe the treatment choices: Pulmonary Function Tests, ABGs and oximetry.   Pulmonary Rehab from 07/28/2015 in [facility] Cardiac and Pulmonary Rehab  Date  05/02/15  Educator  [facility]  Instruction Review Code (retired)  2- meets Engineer, maintenance: - Provide group verbal and written instruction for methods to conserve energy, plan and organize activities. Instruct on pacing techniques, use of adaptive equipment and posture/positioning to relieve shortness of breath.   Triggers: - Group verbal and written instruction to review types of environmental controls: home humidity, furnaces, filters, dust mite/pet prevention, HEPA vacuums. To discuss weather changes, air quality and the benefits of nasal washing.   Exacerbations: - Group verbal and written instruction to provide: warning signs, infection symptoms, calling MD promptly, preventive modes, and value of vaccinations. Review: effective airway clearance, coughing and/or vibration techniques. Create an  Action Plan.   Oxygen: - Individual and group verbal and written instruction on oxygen therapy. Includes supplement oxygen, available portable oxygen systems, continuous and intermittent flow rates, oxygen safety,  concentrators, and Medicare reimbursement for oxygen.   Pulmonary Rehab from 01/10/2017 in [facility] Cardiac and Pulmonary Rehab  Date  12/20/16  Educator  [facility]  Instruction Review Code  1- Verbalizes Understanding      Respiratory Medications: - Group verbal and written instruction to review medications for lung disease. Drug class, frequency, complications, importance of spacers, rinsing mouth after steroid MDI's, and proper cleaning methods for nebulizers.   Pulmonary Rehab from 01/10/2017 in [facility] Cardiac and Pulmonary Rehab  Date  12/20/16  Educator  [facility]  Instruction Review Code  1- Verbalizes Understanding      AED/CPR: - Group verbal and written instruction with the use of models to demonstrate the basic use of the AED with the basic ABC's of resuscitation.   Pulmonary Rehab from 07/28/2015 in [facility] Cardiac and Pulmonary Rehab  Date  05/23/15  Educator  [facility]  Instruction Review Code (retired)  2- meets goals/outcomes      Breathing Retraining: - Provides individuals verbal and written instruction on purpose, frequency, and proper technique of diaphragmatic breathing and pursed-lipped breathing. Applies individual practice skills.   Pulmonary Rehab from 01/10/2017 in [facility] Cardiac and Pulmonary Rehab  Date  12/20/16  Educator  [facility]  Instruction Review Code  1- Verbalizes Understanding      Anatomy and Physiology of the Lungs: - Group verbal and written instruction with the use of models to provide basic lung anatomy and physiology related to function, structure and complications of lung disease.   Anatomy & Physiology of the Heart: - Group verbal and written instruction and models provide basic cardiac anatomy and physiology, with the coronary electrical and arterial systems. Review of: AMI, Angina, Valve disease, Heart Failure, Cardiac Arrhythmia, Pacemakers, and the ICD.   Heart Failure: - Group verbal and written instruction on the basics of heart failure: signs/symptoms,  treatments, explanation of ejection fraction, enlarged heart and cardiomyopathy.   Sleep Apnea: - Individual verbal and written instruction to review Obstructive Sleep Apnea. Review of risk factors, methods for diagnosing and types of masks and machines for OSA.   Pulmonary Rehab from 01/10/2017 in [facility] Cardiac and Pulmonary Rehab  Date  12/20/16  Educator  [facility]  Instruction Review Code  1- Verbalizes Understanding      Anxiety: - Provides group, verbal and written instruction on the correlation between heart/lung disease and anxiety, treatment options, and management of anxiety.   Relaxation: - Provides group, verbal and written instruction about the benefits of relaxation for patients with heart/lung disease. Also provides patients with examples of relaxation techniques.   Pulmonary Rehab from 01/10/2017 in [facility] Cardiac and Pulmonary Rehab  Date  01/05/17  Educator  [facility]  Instruction Review Code  1- Verbalizes Understanding      Cardiac Medications: - Group verbal and written instruction to review commonly prescribed medications for heart disease. Reviews the medication, class of the drug, and side effects.   Pulmonary Rehab from 07/28/2015 in [facility] Cardiac and Pulmonary Rehab  Date  04/25/15  Educator  [facility]  Instruction Review Code (retired)  2- meets goals/outcomes      Know Your Numbers: -Group verbal and written instruction about important numbers in your health.  Review of Cholesterol, Blood Pressure, Diabetes, and BMI and the role they play in your overall health.   Pulmonary Rehab from 01/10/2017  in [facility] Cardiac and Pulmonary Rehab  Date  12/31/16  Educator  [facility]  Instruction Review Code  1- Verbalizes Understanding      Other: -Provides group and verbal instruction on various topics (see comments)    Knowledge Questionnaire Score: Knowledge Questionnaire Score - 12/20/16 1538      Knowledge Questionnaire Score   Pre Score  9/10        Core Components/Risk  Factors/Patient Goals at Admission: Personal Goals and Risk Factors at Admission - 12/20/16 1550      Core Components/Risk Factors/Patient Goals on Admission    Weight Management  Yes;Weight Maintenance    Intervention  Weight Management: Develop a combined nutrition and exercise program designed to reach desired caloric intake, while maintaining appropriate intake of nutrient and fiber, sodium and fats, and appropriate energy expenditure required for the weight goal.;Weight Management: Provide education and appropriate resources to help participant work on and attain dietary goals.;Weight Management/Obesity: Establish reasonable short term and long term weight goals.    Admit Weight  156 lb (70.8 kg)    Goal Weight: Short Term  151 lb (68.5 kg)    Goal Weight: Long Term  151 lb (68.5 kg)    Expected Outcomes  Short Term: Continue to assess and modify interventions until short term weight is achieved;Long Term: Adherence to nutrition and physical activity/exercise program aimed toward attainment of established weight goal;Weight Maintenance: Understanding of the daily nutrition guidelines, which includes 25-35% calories from fat, 7% or less cal from saturated fats, less than 200mg  cholesterol, less than 1.5gm of sodium, & 5 or more servings of fruits and vegetables daily;Understanding recommendations for meals to include 15-35% energy as protein, 25-35% energy from fat, 35-60% energy from carbohydrates, less than 200mg  of dietary cholesterol, 20-35 gm of total fiber daily;Understanding of distribution of calorie intake throughout the day with the consumption of 4-5 meals/snacks    Lipids  Yes    Intervention  Provide education and support for participant on nutrition & aerobic/resistive exercise along with prescribed medications to achieve LDL 70mg , HDL >40mg .    Expected Outcomes  Short Term: Participant states understanding of desired cholesterol values and is compliant with medications prescribed.  Participant is following exercise prescription and nutrition guidelines.;Long Term: Cholesterol controlled with medications as prescribed, with individualized exercise RX and with personalized nutrition plan. Value goals: LDL < 70mg , HDL > 40 mg.    Stress  Yes    Intervention  Offer individual and/or small group education and counseling on adjustment to heart disease, stress management and health-related lifestyle change. Teach and support self-help strategies.;Refer participants experiencing significant psychosocial distress to appropriate mental health specialists for further evaluation and treatment. When possible, include family members and significant others in education/counseling sessions.    Expected Outcomes  Short Term: Participant demonstrates changes in health-related behavior, relaxation and other stress management skills, ability to obtain effective social support, and compliance with psychotropic medications if prescribed.;Long Term: Emotional wellbeing is indicated by absence of clinically significant psychosocial distress or social isolation.       Core Components/Risk Factors/Patient Goals Review:    Core Components/Risk Factors/Patient Goals at Discharge (Final Review):    ITP Comments: ITP Comments    Row Name 12/20/16 1638 01/10/17 0830         ITP Comments  Medical Evaluation completed. Chart sent to Dr. Bethann Punches Director of [facility]. Medical Diagnosis can be found in [facility] encounter 12/20/16  30 day review completed. ITP sent to Dr. Loraine Leriche  Product manager of [name]. Continue with ITP unless changes are made by physician.           Comments: discharge ITP

## 2017-01-24 NOTE — Progress Notes (Signed)
Cardiac Individual Treatment Plan  Patient Details  Name: Vincent Kennedy MRN: [id number] Date of Birth: [birth year] Referring Provider:     Cardiac Rehab from 01/24/2017 in [facility] Cardiac [facility] Pulmonary Re[facility]  Referring Provider  [name] MD      Initial Encounter Date:    Cardiac Rehab from 01/24/2017 in [facility] Cardiac and Pulmonary Rehab  Date  01/24/17  Referring Provider  [name] MD      Visit Diagnosis: Status post coronary artery stent placement  Patient's Home Medications on Admission:  Current Outpatient Medications:  .  albuterol (PROVENTIL HFA;VENTOLIN HFA) 108 (90 Base) MCG/ACT inhaler, Inhale 2 puffs into the lungs every 6 (six) hours as needed for wheezing or shortness of breath., Disp: 1 Inhaler, Rfl: 2 .  aspirin EC 81 MG tablet, Take 1 tablet (81 mg total) by mouth daily., Disp: 30 tablet, Rfl: 6 .  budesonide (PULMICORT) 0.5 MG/2ML nebulizer solution, Take 2 mLs (0.5 mg total) by nebulization 2 (two) times daily., Disp: 2 mL, Rfl: 12 .  clopidogrel (PLAVIX) 75 MG tablet, Take 1 tablet (75 mg total) by mouth daily., Disp: 30 tablet, Rfl: 6 .  finasteride (PROSCAR) 5 MG tablet, Take 1 tablet (5 mg total) by mouth daily., Disp: 90 tablet, Rfl: 1 .  mirtazapine (REMERON) 15 MG tablet, Take 1 tablet (15 mg total) by mouth at bedtime., Disp: 30 tablet, Rfl: 11 .  mometasone-formoterol (DULERA) 100-5 MCG/ACT AERO, Inhale 2 puffs into the lungs 2 (two) times daily., Disp: 1 Inhaler, Rfl: 1 .  nitroGLYCERIN (NITROSTAT) 0.4 MG SL tablet, Place 1 tablet (0.4 mg total) under the tongue every 5 (five) minutes as needed for chest pain., Disp: 25 tablet, Rfl: 12 .  pantoprazole (PROTONIX) 40 MG tablet, Take 1 tablet (40 mg total) by mouth daily., Disp: 30 tablet, Rfl: 6 .  Pirfenidone (ESBRIET) 267 MG CAPS, Take 801 mg by mouth 3 (three) times daily. , Disp: , Rfl:  .  rivastigmine (EXELON) 9.5 mg/24hr, Place 1 patch (9.5 mg total) onto the skin daily. (Patient taking  differently: Place 9.5 mg onto the skin once a week. ), Disp: 30 patch, Rfl: 2 .  rosuvastatin (CRESTOR) 40 MG tablet, TAKE ONE TABLET EVERY DAY (Patient taking differently: Take 40 mg by mouth every day), Disp: 90 tablet, Rfl: 2 .  zolpidem (AMBIEN) 5 MG tablet, Take 5 mg by mouth at bedtime as needed for sleep. , Disp: , Rfl:  .  carvedilol (COREG) 6.25 MG tablet, Take 1 tablet (6.25 mg total) by mouth 2 (two) times daily. (Patient not taking: Reported on 01/24/2017), Disp: 60 tablet, Rfl: 6  Past Medical History: Past Medical History:  Diagnosis Date  . Abnormal nuclear cardiac imaging test    Dr. [name]  . BPH (benign prostatic hyperplasia)   . Coronary artery disease    Cardiac cath September 2008: 20% left main stenosis, 40% mid LAD stenosis, 99% ostial first diagonal stenosis in a large branch and 90% ostial disease in second diagonal but the vessel was very small, 20% mid RCA stenosis with normal ejection fraction. Successful angioplasty and drug-eluting stent placement to the ostial first diagonal with a 2.5 x 15 mm Xience stent  . GERD (gastroesophageal reflux disease)   . Hyperlipidemia   . Hypertension    BP has never been high(per pt).  Metoprolol is to keep BP low because of stent (afterload)  . On home oxygen therapy    "2-3L; 24/7" (01/19/2017)  . OSA on CPAP   .  Oxygen deficiency   . Pneumonia 07/2016  . Pulmonary fibrosis (HCC) 08/26/2015  . Shortness of breath dyspnea    with exertion  . Wears hearing aid    (sometimes)    Tobacco Use: Social History   Tobacco Use  Smoking Status Never Smoker  Smokeless Tobacco Never Used    Labs: Recent Review Flowsheet Data    Labs for ITP Cardiac and Pulmonary Rehab Latest Ref Rng & Units 01/12/2017 01/12/2017 01/12/2017 01/12/2017 01/12/2017   Cholestrol 125 - 200 mg/dL - - - - -   LDLCALC <161 mg/dL - - - - -   HDL >=09 mg/dL - - - - -   Trlycerides <150 mg/dL - - - - -   PHART 6.045 - 7.450 - 7.378 - - -   PCO2ART  32.0 - 48.0 mmHg - 41.3 - - -   HCO3 20.0 - 28.0 mmol/L 23.9 24.3 23.8 24.1 25.3   TCO2 22 - 32 mmol/L 25 26 25 25 27    ACIDBASEDEF 0.0 - 2.0 mmol/L 2.0 1.0 2.0 1.0 1.0   O2SAT % 78.0 98.0 77.0 76.0 64.0       Exercise Target Goals: Date: 01/24/17  Exercise Program Goal: Individual exercise prescription set with THRR, safety & activity barriers. Participant demonstrates ability to understand and report RPE using BORG scale, to self-measure pulse accurately, and to acknowledge the importance of the exercise prescription.  Exercise Prescription Goal: Starting with aerobic activity 30 plus minutes a day, 3 days per week for initial exercise prescription. Provide home exercise prescription and guidelines that participant acknowledges understanding prior to discharge.  Activity Barriers & Risk Stratification: Activity Barriers & Cardiac Risk Stratification - 01/24/17 1341      Activity Barriers & Cardiac Risk Stratification   Activity Barriers  Deconditioning;Shortness of Breath    Cardiac Risk Stratification  High       6 Minute Walk: 6 Minute Walk    Row Name 12/20/16 1634 01/24/17 1305       6 Minute Walk   Phase  Initial  Initial    Distance  760 feet  250 feet    Walk Time  4.4 minutes stopped test due to desaturations  1.63 minutes    # of Rest Breaks  1 at end of test  1 test stopped at 1:38 for desaturation    MPH  1.96  1.74    METS  1.93  0.94    RPE  14  13    Perceived Dyspnea   3  2    VO2 Peak  6.76  3.29    Symptoms  Yes (comment)  Yes (comment) wearing concentrator on back    Comments  SOB  SOB    Resting HR  73 bpm  77 bpm    Resting BP  126/64  148/84    Resting Oxygen Saturation   94 %  97 %    Exercise Oxygen Saturation  during 6 min walk  79 %  73 %    Max Ex. HR  119 bpm  122 bpm    Max Ex. BP  154/74  152/64    2 Minute Post BP  156/64 recheck 146/72  144/70      Interval HR   1 Minute HR  73  80    2 Minute HR  116  122 test stopped at 1:38     3 Minute HR  104  -    4 Minute HR  116  66    5 Minute HR  119 stopped at 4:24 due desaturation to 79%  -    2 Minute Post HR  80  -    Interval Heart Rate?  Yes  Yes      Interval Oxygen   Interval Oxygen?  Yes  Yes    Baseline Oxygen Saturation %  94 %  97 %    1 Minute Oxygen Saturation %  92 %  80 %    1 Minute Liters of Oxygen  4 L pulsed  4 L pulsed    2 Minute Oxygen Saturation %  83 %  73 % test stopped at 1:38 desaturation at 75%    2 Minute Liters of Oxygen  4 L  4 L    3 Minute Oxygen Saturation %  81 %  90 %    3 Minute Liters of Oxygen  4 L  4 L    4 Minute Oxygen Saturation %  80 % 79% at 4:24  94 %    4 Minute Liters of Oxygen  4 L  4 L    2 Minute Post Oxygen Saturation %  92 %  -    2 Minute Post Liters of Oxygen  4 L  -       Oxygen Initial Assessment: Oxygen Initial Assessment - 01/24/17 1304      Home Oxygen   Home Oxygen Device  Portable Concentrator;Home Concentrator;E-Tanks    Sleep Oxygen Prescription  CPAP    Liters per minute  2.5    Home Exercise Oxygen Prescription  Continuous    Liters per minute  4    Home at Rest Exercise Oxygen Prescription  Continuous    Liters per minute  3    Compliance with Home Oxygen Use  Yes      Initial 6 min Walk   Oxygen Used  Portable Concentrator;Pulsed    Liters per minute  4      Program Oxygen Prescription   Program Oxygen Prescription  Continuous    Liters per minute  4      Intervention   Short Term Goals  To learn and exhibit compliance with exercise, home and travel O2 prescription;To learn and understand importance of maintaining oxygen saturations>88%;To learn and understand importance of monitoring SPO2 with pulse oximeter and demonstrate accurate use of the pulse oximeter.;To learn and demonstrate proper pursed lip breathing techniques or other breathing techniques.    Long  Term Goals  Exhibits compliance with exercise, home and travel O2 prescription;Verbalizes importance of monitoring SPO2 with  pulse oximeter and return demonstration;Maintenance of O2 saturations>88%;Exhibits proper breathing techniques, such as pursed lip breathing or other method taught during program session;Compliance with respiratory medication       Oxygen Re-Evaluation:   Oxygen Discharge (Final Oxygen Re-Evaluation):   Initial Exercise Prescription: Initial Exercise Prescription - 01/24/17 1400      Date of Initial Exercise RX and Referring Provider   Date  01/24/17    Referring Provider  [name] MD      Oxygen   Oxygen  Continuous    Liters  4      Treadmill   MPH  1.5    Grade  0    Minutes  15    METs  2.15      NuStep   Level  1    SPM  80    Minutes  15    METs  1.7  Arm Ergometer   Level  1    RPM  25    Minutes  15    METs  1.7      Prescription Details   Frequency (times per week)  3    Duration  Progress to 45 minutes of aerobic exercise without signs/symptoms of physical distress      Intensity   THRR 40-80% of Max Heartrate  101-126    Ratings of Perceived Exertion  11-13    Perceived Dyspnea  0-4      Progression   Progression  Continue to progress workloads to maintain intensity without signs/symptoms of physical distress.      Resistance Training   Training Prescription  Yes    Weight  3 lbs    Reps  10-15       Perform Capillary Blood Glucose checks as needed.  Exercise Prescription Changes: Exercise Prescription Changes    Row Name 12/20/16 1600 12/28/16 1600 01/12/17 1500 01/24/17 1400       Response to Exercise   Blood Pressure (Admit)  126/64  128/54  124/64  148/84    Blood Pressure (Exercise)  154/74  130/80  -  152/64    Blood Pressure (Exit)  156/64 recheck 146/72  122/64  124/64  144/70    Heart Rate (Admit)  73 bpm  74 bpm  71 bpm  77 bpm    Heart Rate (Exercise)  120 bpm  92 bpm  94 bpm  122 bpm    Heart Rate (Exit)  80 bpm  72 bpm  63 bpm  66 bpm    Oxygen Saturation (Admit)  94 %  96 %  90 %  97 %    Oxygen Saturation  (Exercise)  79 %  87 %  87 %  73 %    Oxygen Saturation (Exit)  92 %  99 %  98 %  90 %    Rating of Perceived Exertion (Exercise)  14  11  12  13     Perceived Dyspnea (Exercise)  3  1  1.5  2    Symptoms  SOB  none  none  none    Comments  walk test results  second full day of exercise  -  walk test results    Duration  -  Progress to 45 minutes of aerobic exercise without signs/symptoms of physical distress  Progress to 45 minutes of aerobic exercise without signs/symptoms of physical distress  -    Intensity  -  THRR unchanged  THRR unchanged  -      Progression   Progression  -  Continue to progress workloads to maintain intensity without signs/symptoms of physical distress.  Continue to progress workloads to maintain intensity without signs/symptoms of physical distress.  -    Average METs  -  2.25  2.42  -      Resistance Training   Training Prescription  -  Yes  Yes  -    Weight  -  3 lbs  3 lbs  -    Reps  -  10-15  10-15  -      Interval Training   Interval Training  -  No  No  -      Oxygen   Oxygen  -  Continuous  Continuous  -    Liters  -  3  3  -      Treadmill   MPH  -  1.5  2  -  Grade  -  0.5  0.5  -    Minutes  -  1  15  -    METs  -  2.25  2.67  -      NuStep   Level  -  2  4  -    SPM  -  100  114  -    Minutes  -  15  15  -    METs  -  1.9  2.3  -      REL-XR   Level  -  1  1  -    Speed  -  44  46  -    Minutes  -  15  15  -    METs  -  2.6  2.3  -       Exercise Comments: Exercise Comments    Row Name 12/24/16 1135           Exercise Comments  First full day of exercise!  Patient was oriented to gym and equipment including functions, settings, policies, and procedures.  Patient's individual exercise prescription and treatment plan were reviewed.  All starting workloads were established based on the results of the 6 minute walk test done at initial orientation visit.  The plan for exercise progression was also introduced and progression will  be customized based on patient's performance and goals          Exercise Goals and Review: Exercise Goals    Row Name 12/20/16 1654 01/24/17 1402           Exercise Goals   Increase Physical Activity  Yes  Yes      Intervention  Provide advice, education, support and counseling about physical activity/exercise needs.;Develop an individualized exercise prescription for aerobic and resistive training based on initial evaluation findings, risk stratification, comorbidities and participant's personal goals.  Provide advice, education, support and counseling about physical activity/exercise needs.;Develop an individualized exercise prescription for aerobic and resistive training based on initial evaluation findings, risk stratification, comorbidities and participant's personal goals.      Expected Outcomes  Achievement of increased cardiorespiratory fitness and enhanced flexibility, muscular endurance and strength shown through measurements of functional capacity and personal statement of participant.  Achievement of increased cardiorespiratory fitness and enhanced flexibility, muscular endurance and strength shown through measurements of functional capacity and personal statement of participant.      Increase Strength and Stamina  Yes  Yes      Intervention  Provide advice, education, support and counseling about physical activity/exercise needs.;Develop an individualized exercise prescription for aerobic and resistive training based on initial evaluation findings, risk stratification, comorbidities and participant's personal goals.  Provide advice, education, support and counseling about physical activity/exercise needs.;Develop an individualized exercise prescription for aerobic and resistive training based on initial evaluation findings, risk stratification, comorbidities and participant's personal goals.      Expected Outcomes  Achievement of increased cardiorespiratory fitness and enhanced  flexibility, muscular endurance and strength shown through measurements of functional capacity and personal statement of participant.  Achievement of increased cardiorespiratory fitness and enhanced flexibility, muscular endurance and strength shown through measurements of functional capacity and personal statement of participant.      Able to understand and use rate of perceived exertion (RPE) scale  Yes  Yes      Intervention  Provide education and explanation on how to use RPE scale  Provide education and explanation on how to use RPE scale  Expected Outcomes  Short Term: Able to use RPE daily in rehab to express subjective intensity level;Long Term:  Able to use RPE to guide intensity level when exercising independently  Short Term: Able to use RPE daily in rehab to express subjective intensity level;Long Term:  Able to use RPE to guide intensity level when exercising independently      Able to understand and use Dyspnea scale  Yes  Yes      Intervention  Provide education and explanation on how to use Dyspnea scale  Provide education and explanation on how to use Dyspnea scale      Expected Outcomes  Short Term: Able to use Dyspnea scale daily in rehab to express subjective sense of shortness of breath during exertion;Long Term: Able to use Dyspnea scale to guide intensity level when exercising independently  Short Term: Able to use Dyspnea scale daily in rehab to express subjective sense of shortness of breath during exertion;Long Term: Able to use Dyspnea scale to guide intensity level when exercising independently      Knowledge and understanding of Target Heart Rate Range (THRR)  Yes  Yes      Intervention  Provide education and explanation of THRR including how the numbers were predicted and where they are located for reference  Provide education and explanation of THRR including how the numbers were predicted and where they are located for reference      Expected Outcomes  Short Term: Able to  state/look up THRR;Long Term: Able to use THRR to govern intensity when exercising independently;Short Term: Able to use daily as guideline for intensity in rehab  Short Term: Able to state/look up THRR;Long Term: Able to use THRR to govern intensity when exercising independently;Short Term: Able to use daily as guideline for intensity in rehab      Able to check pulse independently  Yes  Yes      Intervention  Provide education and demonstration on how to check pulse in carotid and radial arteries.;Review the importance of being able to check your own pulse for safety during independent exercise  Provide education and demonstration on how to check pulse in carotid and radial arteries.;Review the importance of being able to check your own pulse for safety during independent exercise      Expected Outcomes  Long Term: Able to check pulse independently and accurately;Short Term: Able to explain why pulse checking is important during independent exercise  Long Term: Able to check pulse independently and accurately;Short Term: Able to explain why pulse checking is important during independent exercise      Understanding of Exercise Prescription  Yes  Yes      Intervention  Provide education, explanation, and written materials on patient's individual exercise prescription  Provide education, explanation, and written materials on patient's individual exercise prescription      Expected Outcomes  Short Term: Able to explain program exercise prescription;Long Term: Able to explain home exercise prescription to exercise independently  Short Term: Able to explain program exercise prescription;Long Term: Able to explain home exercise prescription to exercise independently         Exercise Goals Re-Evaluation : Exercise Goals Re-Evaluation    Row Name 12/28/16 1611 01/12/17 1534           Exercise Goal Re-Evaluation   Exercise Goals Review  Increase Strength and Stamina;Increase Physical Activity  Increase  Strength and Stamina;Increase Physical Activity      Comments  Amjad has completed two full days of exercise  in rehab.  He is off to a good start.  We will continue to monitor his progression.   Amjad has continued to do well in rehab.  He has been averaging about two days a week.  He would get more out of it if his attendance was better.  He is up to level 4 on the NuStep.  We will continue to monitor his progress.      Expected Outcomes  Short: Attend rehab classes regularly.  Long: Complete rehab in full this time.   Short: Attend class more regularly.  Long: Exercise at home independently.          Discharge Exercise Prescription (Final Exercise Prescription Changes): Exercise Prescription Changes - 01/24/17 1400      Response to Exercise   Blood Pressure (Admit)  148/84    Blood Pressure (Exercise)  152/64    Blood Pressure (Exit)  144/70    Heart Rate (Admit)  77 bpm    Heart Rate (Exercise)  122 bpm    Heart Rate (Exit)  66 bpm    Oxygen Saturation (Admit)  97 %    Oxygen Saturation (Exercise)  73 %    Oxygen Saturation (Exit)  90 %    Rating of Perceived Exertion (Exercise)  13    Perceived Dyspnea (Exercise)  2    Symptoms  none    Comments  walk test results       Nutrition:  Target Goals: Understanding of nutrition guidelines, daily intake of sodium 1500mg , cholesterol 200mg , calories 30% from fat and 7% or less from saturated fats, daily to have 5 or more servings of fruits and vegetables.  Biometrics: Pre Biometrics - 01/24/17 1403      Pre Biometrics   Height  5' 6.1" (1.679 m)    Weight  155 lb 9.6 oz (70.6 kg)    Waist Circumference  37.5 inches    Hip Circumference  36 inches    Waist to Hip Ratio  1.04 %    BMI (Calculated)  25.04    Single Leg Stand  18.09 seconds        Nutrition Therapy Plan and Nutrition Goals: Nutrition Therapy & Goals - 01/24/17 1337      Intervention Plan   Intervention  Prescribe, educate and counsel regarding individualized  specific dietary modifications aiming towards targeted core components such as weight, hypertension, lipid management, diabetes, heart failure and other comorbidities.;Nutrition handout(s) given to patient.    Expected Outcomes  Short Term Goal: Understand basic principles of dietary content, such as calories, fat, sodium, cholesterol and nutrients.;Short Term Goal: A plan has been developed with personal nutrition goals set during dietitian appointment.;Long Term Goal: Adherence to prescribed nutrition plan.       Nutrition Discharge: Rate Your Plate Scores: Nutrition Assessments - 01/24/17 1338      MEDFICTS Scores   Pre Score  -- will bring in to first session   will bring in to first session      Nutrition Goals Re-Evaluation:   Nutrition Goals Discharge (Final Nutrition Goals Re-Evaluation):   Psychosocial: Target Goals: Acknowledge presence or absence of significant depression and/or stress, maximize coping skills, provide positive support system. Participant is able to verbalize types and ability to use techniques and skills needed for reducing stress and depression.   Initial Review & Psychosocial Screening: Initial Psych Review & Screening - 01/24/17 1338      Initial Review   Current issues with  Current Depression;Current Stress Concerns  Source of Stress Concerns  Chronic Illness;Unable to perform yard/household activities    Comments  He is depressed due to his recent health decline first with pulmonary issues and now with having the blockage in his heart and stent placement. He feels he can not do simple things without being SOB or tired.       Family Dynamics   Good Support System?  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 His wife was with him today at his appointment and is supportive of him beginning the cardiac rehab program.       Barriers   Psychosocial barriers to participate in program  The patient should benefit from  training in stress management and relaxation.      Screening Interventions   Interventions  Yes;Encouraged to exercise;Program counselor consult;To provide support and resources with identified psychosocial needs;Provide feedback about the scores to participant    Expected Outcomes  Short Term goal: Utilizing psychosocial counselor, staff and physician to assist with identification of specific Stressors or current issues interfering with healing process. Setting desired goal for each stressor or current issue identified.;Long Term Goal: Stressors or current issues are controlled or eliminated.;Short Term goal: Identification and review with participant of any Quality of Life or Depression concerns found by scoring the questionnaire.;Long Term goal: The participant improves quality of Life and PHQ9 Scores as seen by post scores and/or verbalization of changes       Quality of Life Scores:  Quality of Life - 01/24/17 1340      Quality of Life Scores   Health/Function Pre  17.11 %    Socioeconomic Pre  26 %    Psych/Spiritual Pre  18.57 %    Family Pre  29 %    GLOBAL Pre  21.11 %       PHQ-9: Recent Review Flowsheet Data    Depression screen [facility] 2/9 01/24/2017 12/20/2016 04/26/2016 12/24/2015 08/26/2015   Decreased Interest 1 2 0 0 0   Down, Depressed, Hopeless 1 1 0 0 0   PHQ - 2 Score 2 3 0 0 0   Altered sleeping 3 0 - - -   Tired, decreased energy 1 1 - - -   Change in appetite 3 1 - - -   Feeling bad or failure about yourself  3 1 - - -   Trouble concentrating 0 0 - - -   Moving slowly or fidgety/restless 0 0 - - -   Suicidal thoughts 0 0 - - -   PHQ-9 Score 12 6 - - -   Difficult doing work/chores Somewhat difficult Somewhat difficult - - -     Interpretation of Total Score  Total Score Depression Severity:  1-4 = Minimal depression, 5-9 = Mild depression, 10-14 = Moderate depression, 15-19 = Moderately severe depression, 20-27 = Severe depression   Psychosocial Evaluation and  Intervention:   Psychosocial Re-Evaluation:   Psychosocial Discharge (Final Psychosocial Re-Evaluation):   Vocational Rehabilitation: Provide vocational rehab assistance to qualifying candidates.   Vocational Rehab Evaluation & Intervention: Vocational Rehab - 01/24/17 1303      Initial Vocational Rehab Evaluation & Intervention   Assessment shows need for Vocational Rehabilitation  No       Education: Education Goals: Education classes will be provided on a variety of topics geared toward better understanding of heart health and risk factor modification. Participant will state understanding/return demonstration of topics presented as noted by  education test scores.  Learning Barriers/Preferences: Learning Barriers/Preferences - 01/24/17 1302      Learning Barriers/Preferences   Learning Barriers  Hearing has hearing aid but does not wear them often   has hearing aid but does not wear them often   Learning Preferences  Audio;Written Material       Education Topics: General Nutrition Guidelines/Fats and Fiber: -Group instruction provided by verbal, written material, models and posters to present the general guidelines for heart healthy nutrition. Gives an explanation and review of dietary fats and fiber.   Pulmonary Rehab from 01/10/2017 in [facility] Cardiac and Pulmonary Rehab  Date  01/03/17  Educator  CR  Instruction Review Code  1- Verbalizes Understanding      Controlling Sodium/Reading Food Labels: -Group verbal and written material supporting the discussion of sodium use in heart healthy nutrition. Review and explanation with models, verbal and written materials for utilization of the food label.   Pulmonary Rehab from 01/10/2017 in [facility] Cardiac and Pulmonary Rehab  Date  01/10/17  Educator  CR  Instruction Review Code  1- Verbalizes Understanding      Exercise Physiology & Risk Factors: - Group verbal and written instruction with models to review the exercise  physiology of the cardiovascular system and associated critical values. Details cardiovascular disease risk factors and the goals associated with each risk factor.   Pulmonary Rehab from 01/10/2017 in [facility] Cardiac and Pulmonary Rehab  Date  12/24/16  Educator  Roney Jaffe, EP  Instruction Review Code  2- Demonstrated Understanding      Aerobic Exercise & Resistance Training: - Gives group verbal and written discussion on the health impact of inactivity. On the components of aerobic and resistive training programs and the benefits of this training and how to safely progress through these programs.   Flexibility, Balance, General Exercise Guidelines: - Provides group verbal and written instruction on the benefits of flexibility and balance training programs. Provides general exercise guidelines with specific guidelines to those with heart or lung disease. Demonstration and skill practice provided.   Pulmonary Rehab from 07/28/2015 in [facility] Cardiac and Pulmonary Rehab  Date  05/07/15  Educator  RM  Instruction Review Code (retired)  2- meets goals/outcomes      Stress Management: - Provides group verbal and written instruction about the health risks of elevated stress, cause of high stress, and healthy ways to reduce stress.   Pulmonary Rehab from 07/28/2015 in [facility] Cardiac and Pulmonary Rehab  Date  05/14/15  Educator  [facility]  Instruction Review Code (retired)  2- meets goals/outcomes      Depression: - Provides group verbal and written instruction on the correlation between heart/lung disease and depressed mood, treatment options, and the stigmas associated with seeking treatment.   Anatomy & Physiology of the Heart: - Group verbal and written instruction and models provide basic cardiac anatomy and physiology, with the coronary electrical and arterial systems. Review of: AMI, Angina, Valve disease, Heart Failure, Cardiac Arrhythmia, Pacemakers, and the ICD.   Cardiac Procedures: -  Group verbal and written instruction to review commonly prescribed medications for heart disease. Reviews the medication, class of the drug, and side effects. Includes the steps to properly store meds and maintain the prescription regimen. (beta blockers and nitrates)   Cardiac Medications I: - Group verbal and written instruction to review commonly prescribed medications for heart disease. Reviews the medication, class of the drug, and side effects. Includes the steps to properly store meds and maintain the prescription regimen.  [facility]  Date  04/25/15  Educator  CE  Instruction Review Code (retired)  2- meets goals/outcomes      Cardiac Medications II: -Group verbal and written instruction to review commonly prescribed medications for heart disease. Reviews the medication, class of the drug, and side effects. (all other drug classes)   [facility]  Date  12/31/16  Educator  [facility]  Instruction Review Code  1- Verbalizes Understanding       Go Sex-Intimacy & Heart Disease, Get SMART - Goal Setting: - Group verbal and written instruction through game format to discuss heart disease and the return to sexual intimacy. Provides group verbal and written material to discuss and apply goal setting through the application of the S.M.A.R.T. Method.   Other Matters of the Heart: - Provides group verbal, written materials and models to describe Heart Failure, Angina, Valve Disease, Peripheral Artery Disease, and Diabetes in the realm of heart disease. Includes description of the disease process and treatment options available to the cardiac patient.   Exercise & Equipment Safety: - Individual verbal instruction and demonstration of equipment use and safety with use of the equipment.   [facility]  Date  01/24/17  Educator  KS   Instruction Review Code  1- Verbalizes Understanding      Infection Prevention: - Provides verbal and written material to individual with discussion of infection control including proper hand washing and proper equipment cleaning during exercise session.   [facility]  Date  01/24/17  Educator  KS  Instruction Review Code  1- Verbalizes Understanding      Falls Prevention: - Provides verbal and written material to individual with discussion of falls prevention and safety.   [facility]  Date  01/24/17  Educator  KS  Instruction Review Code  1- Verbalizes Understanding      Diabetes: - Individual verbal and written instruction to review signs/symptoms of diabetes, desired ranges of glucose level fasting, after meals and with exercise. Acknowledge that pre and post exercise glucose checks will be done for 3 sessions at entry of program.   Other: -Provides group and verbal instruction on various topics (see comments)    Knowledge Questionnaire Score: Knowledge Questionnaire Score - 01/24/17 1353      Knowledge Questionnaire Score   Pre Score  24/28 Reviewed correct answeres with patient who verbalied understanding.   Reviewed correct answeres with patient who verbalied understanding.      Core Components/Risk Factors/Patient Goals at Admission: Personal Goals and Risk Factors at Admission - 01/24/17 1335      Core Components/Risk Factors/Patient Goals on Admission    Weight Management  Yes;Weight Maintenance    Intervention  Weight Management: Develop a combined nutrition and exercise program designed to reach desired caloric intake, while maintaining appropriate intake of nutrient and fiber, sodium and fats, and appropriate energy expenditure required for the weight goal.;Weight Management: Provide education and appropriate resources to help participant work on and  attain dietary goals.;Weight Management/Obesity: Establish reasonable short term and long term weight goals.    Admit Weight  155 lb 9.6 oz (70.6 kg)    Goal Weight: Short Term  150 lb (68 kg)    Goal Weight: Long Term  150 lb (68 kg)    Expected  Outcomes  Weight Maintenance: Understanding of the daily nutrition guidelines, which includes 25-35% calories from fat, 7% or less cal from saturated fats, less than 200mg  cholesterol, less than 1.5gm of sodium, & 5 or more servings of fruits and vegetables daily;Long Term: Adherence to nutrition and physical activity/exercise program aimed toward attainment of established weight goal;Short Term: Continue to assess and modify interventions until short term weight is achieved;Understanding recommendations for meals to include 15-35% energy as protein, 25-35% energy from fat, 35-60% energy from carbohydrates, less than 200mg  of dietary cholesterol, 20-35 gm of total fiber daily;Understanding of distribution of calorie intake throughout the day with the consumption of 4-5 meals/snacks    Improve shortness of breath with ADL's  Yes Patient would like to be able to walk and do things around the house without getting so short of breath   Patient would like to be able to walk and do things around the house without getting so short of breath   Intervention  Provide education, individualized exercise plan and daily activity instruction to help decrease symptoms of SOB with activities of daily living.    Expected Outcomes  Short Term: Achieves a reduction of symptoms when performing activities of daily living.    Develop more efficient breathing techniques such as purse lipped breathing and diaphragmatic breathing; and practicing self-pacing with activity  Yes    Intervention  Provide education, demonstration and support about specific breathing techniuqes utilized for more efficient breathing. Include techniques such as pursed lipped breathing, diaphragmatic breathing and  self-pacing activity.    Expected Outcomes  Short Term: Participant will be able to demonstrate and use breathing techniques as needed throughout daily activities.    Increase knowledge of respiratory medications and ability to use respiratory devices properly   Yes    Intervention  Provide education and demonstration as needed of appropriate use of medications, inhalers, and oxygen therapy.    Expected Outcomes  Short Term: Achieves understanding of medications use. Understands that oxygen is a medication prescribed by physician. Demonstrates appropriate use of inhaler and oxygen therapy.    Hypertension  Yes    Intervention  Provide education on lifestyle modifcations including regular physical activity/exercise, weight management, moderate sodium restriction and increased consumption of fresh fruit, vegetables, and low fat dairy, alcohol moderation, and smoking cessation.;Monitor prescription use compliance.    Expected Outcomes  Short Term: Continued assessment and intervention until BP is < 140/79mm HG in hypertensive participants. < 130/49mm HG in hypertensive participants with diabetes, heart failure or chronic kidney disease.;Long Term: Maintenance of blood pressure at goal levels.    Lipids  Yes    Intervention  Provide education and support for participant on nutrition & aerobic/resistive exercise along with prescribed medications to achieve LDL 70mg , HDL >40mg .    Expected Outcomes  Short Term: Participant states understanding of desired cholesterol values and is compliant with medications prescribed. Participant is following exercise prescription and nutrition guidelines.;Long Term: Cholesterol controlled with medications as prescribed, with individualized exercise RX and with personalized nutrition plan. Value goals: LDL < 70mg , HDL > 40 mg.    Stress  Yes    Intervention  Offer individual and/or small group education and counseling on adjustment to heart disease, stress management and  health-related lifestyle change. Teach and support self-help strategies.;Refer participants experiencing significant psychosocial distress to appropriate mental health specialists for further evaluation and treatment. When possible, include family members and significant others in education/counseling sessions.    Expected Outcomes  Short Term: Participant demonstrates  changes in health-related behavior, relaxation and other stress management skills, ability to obtain effective social support, and compliance with psychotropic medications if prescribed.;Long Term: Emotional wellbeing is indicated by absence of clinically significant psychosocial distress or social isolation.       Core Components/Risk Factors/Patient Goals Review:    Core Components/Risk Factors/Patient Goals at Discharge (Final Review):    ITP Comments: ITP Comments    Row Name 12/20/16 1638 01/10/17 0830 01/24/17 1324       ITP Comments  Medical Evaluation completed. Chart sent to Dr. Bethann Punches Director of [facility]. Medical Diagnosis can be found in [facility] encounter 12/20/16  30 day review completed. ITP sent to Dr. Bethann Punches Director of [name]. Continue with ITP unless changes are made by physician.    Medical Review Completed; initial ITP created. Diagnosis Documentation can be found in [facility] note dated 01/20/2017.        Comments: Initial Med Review

## 2017-01-25 MED FILL — Lidocaine HCl Local Inj 2%: INTRAMUSCULAR | Qty: 20 | Status: AC

## 2017-01-26 ENCOUNTER — Encounter: Payer: Self-pay | Admitting: *Deleted

## 2017-01-26 DIAGNOSIS — Z7982 Long term (current) use of aspirin: Secondary | ICD-10-CM | POA: Diagnosis not present

## 2017-01-26 DIAGNOSIS — Z955 Presence of coronary angioplasty implant and graft: Secondary | ICD-10-CM

## 2017-01-26 DIAGNOSIS — Z79899 Other long term (current) drug therapy: Secondary | ICD-10-CM | POA: Diagnosis not present

## 2017-01-26 DIAGNOSIS — G4730 Sleep apnea, unspecified: Secondary | ICD-10-CM | POA: Diagnosis not present

## 2017-01-26 DIAGNOSIS — J8410 Pulmonary fibrosis, unspecified: Secondary | ICD-10-CM | POA: Diagnosis not present

## 2017-01-26 DIAGNOSIS — E785 Hyperlipidemia, unspecified: Secondary | ICD-10-CM | POA: Diagnosis not present

## 2017-01-26 DIAGNOSIS — Z7951 Long term (current) use of inhaled steroids: Secondary | ICD-10-CM | POA: Diagnosis not present

## 2017-01-26 DIAGNOSIS — I2510 Atherosclerotic heart disease of native coronary artery without angina pectoris: Secondary | ICD-10-CM | POA: Diagnosis not present

## 2017-01-26 DIAGNOSIS — I10 Essential (primary) hypertension: Secondary | ICD-10-CM | POA: Diagnosis not present

## 2017-01-26 NOTE — Progress Notes (Signed)
Cardiac Individual Treatment Plan  Patient Details  Name: Vincent Kennedy MRN: [id number] Date of Birth: Aug 20, 1932 Referring Provider:     Cardiac Rehab from 01/24/2017 in [facility] Cardiac and Pulmonary Rehab  Referring Provider  [name] MD      Initial Encounter Date:    Cardiac Rehab from 01/24/2017 in [facility] Cardiac and Pulmonary Rehab  Date  01/24/17  Referring Provider  [name] MD      Visit Diagnosis: Status post coronary artery stent placement  Patient's Home Medications on Admission:  Current Outpatient Medications:  .  albuterol (PROVENTIL HFA;VENTOLIN HFA) 108 (90 Base) MCG/ACT inhaler, Inhale 2 puffs into the lungs every 6 (six) hours as needed for wheezing or shortness of breath., Disp: 1 Inhaler, Rfl: 2 .  aspirin EC 81 MG tablet, Take 1 tablet (81 mg total) by mouth daily., Disp: 30 tablet, Rfl: 6 .  budesonide (PULMICORT) 0.5 MG/2ML nebulizer solution, Take 2 mLs (0.5 mg total) by nebulization 2 (two) times daily., Disp: 2 mL, Rfl: 12 .  carvedilol (COREG) 6.25 MG tablet, Take 1 tablet (6.25 mg total) by mouth 2 (two) times daily. (Patient not taking: Reported on 01/24/2017), Disp: 60 tablet, Rfl: 6 .  clopidogrel (PLAVIX) 75 MG tablet, Take 1 tablet (75 mg total) by mouth daily., Disp: 30 tablet, Rfl: 6 .  finasteride (PROSCAR) 5 MG tablet, Take 1 tablet (5 mg total) by mouth daily., Disp: 90 tablet, Rfl: 1 .  mirtazapine (REMERON) 15 MG tablet, Take 1 tablet (15 mg total) by mouth at bedtime., Disp: 30 tablet, Rfl: 11 .  mometasone-formoterol (DULERA) 100-5 MCG/ACT AERO, Inhale 2 puffs into the lungs 2 (two) times daily., Disp: 1 Inhaler, Rfl: 1 .  nitroGLYCERIN (NITROSTAT) 0.4 MG SL tablet, Place 1 tablet (0.4 mg total) under the tongue every 5 (five) minutes as needed for chest pain., Disp: 25 tablet, Rfl: 12 .  pantoprazole (PROTONIX) 40 MG tablet, Take 1 tablet (40 mg total) by mouth daily., Disp: 30 tablet, Rfl: 6 .  Pirfenidone (ESBRIET) 267 MG CAPS,  Take 801 mg by mouth 3 (three) times daily. , Disp: , Rfl:  .  rivastigmine (EXELON) 9.5 mg/24hr, Place 1 patch (9.5 mg total) onto the skin daily. (Patient taking differently: Place 9.5 mg onto the skin once a week. ), Disp: 30 patch, Rfl: 2 .  rosuvastatin (CRESTOR) 40 MG tablet, TAKE ONE TABLET EVERY DAY (Patient taking differently: Take 40 mg by mouth every day), Disp: 90 tablet, Rfl: 2 .  zolpidem (AMBIEN) 5 MG tablet, Take 5 mg by mouth at bedtime as needed for sleep. , Disp: , Rfl:   Past Medical History: Past Medical History:  Diagnosis Date  . Abnormal nuclear cardiac imaging test    Dr. [name]  . BPH (benign prostatic hyperplasia)   . Coronary artery disease    Cardiac cath September 2008: 20% left main stenosis, 40% mid LAD stenosis, 99% ostial first diagonal stenosis in a large branch and 90% ostial disease in second diagonal but the vessel was very small, 20% mid RCA stenosis with normal ejection fraction. Successful angioplasty and drug-eluting stent placement to the ostial first diagonal with a 2.5 x 15 mm Xience stent  . GERD (gastroesophageal reflux disease)   . Hyperlipidemia   . Hypertension    BP has never been high(per pt).  Metoprolol is to keep BP low because of stent (afterload)  . On home oxygen therapy    "2-3L; 24/7" (01/19/2017)  . OSA on CPAP   .  Oxygen deficiency   . Pneumonia 07/2016  . Pulmonary fibrosis (HCC) 08/26/2015  . Shortness of breath dyspnea    with exertion  . Wears hearing aid    (sometimes)    Tobacco Use: Social History   Tobacco Use  Smoking Status Never Smoker  Smokeless Tobacco Never Used    Labs: Recent Review Flowsheet Data    Labs for ITP Cardiac and Pulmonary Rehab Latest Ref Rng & Units 01/12/2017 01/12/2017 01/12/2017 01/12/2017 01/12/2017   Cholestrol 125 - 200 mg/dL - - - - -   LDLCALC <696 mg/dL - - - - -   HDL >=29 mg/dL - - - - -   Trlycerides <150 mg/dL - - - - -   PHART 5.284 - 7.450 - 7.378 - - -   PCO2ART 32.0 -  48.0 mmHg - 41.3 - - -   HCO3 20.0 - 28.0 mmol/L 23.9 24.3 23.8 24.1 25.3   TCO2 22 - 32 mmol/L 25 26 25 25 27    ACIDBASEDEF 0.0 - 2.0 mmol/L 2.0 1.0 2.0 1.0 1.0   O2SAT % 78.0 98.0 77.0 76.0 64.0       Exercise Target Goals:    Exercise Program Goal: Individual exercise prescription set with THRR, safety & activity barriers. Participant demonstrates ability to understand and report RPE using BORG scale, to self-measure pulse accurately, and to acknowledge the importance of the exercise prescription.  Exercise Prescription Goal: Starting with aerobic activity 30 plus minutes a day, 3 days per week for initial exercise prescription. Provide home exercise prescription and guidelines that participant acknowledges understanding prior to discharge.  Activity Barriers & Risk Stratification: Activity Barriers & Cardiac Risk Stratification - 01/24/17 1341      Activity Barriers & Cardiac Risk Stratification   Activity Barriers  Deconditioning;Shortness of Breath    Cardiac Risk Stratification  High       6 Minute Walk: 6 Minute Walk    Row Name 12/20/16 1634 01/24/17 1305       6 Minute Walk   Phase  Initial  Initial    Distance  760 feet  250 feet    Walk Time  4.4 minutes stopped test due to desaturations  1.63 minutes    # of Rest Breaks  1 at end of test  1 test stopped at 1:38 for desaturation    MPH  1.96  1.74    METS  1.93  0.94    RPE  14  13    Perceived Dyspnea   3  2    VO2 Peak  6.76  3.29    Symptoms  Yes (comment)  Yes (comment) wearing concentrator on back    Comments  SOB  SOB    Resting HR  73 bpm  77 bpm    Resting BP  126/64  148/84    Resting Oxygen Saturation   94 %  97 %    Exercise Oxygen Saturation  during 6 min walk  79 %  73 %    Max Ex. HR  119 bpm  122 bpm    Max Ex. BP  154/74  152/64    2 Minute Post BP  156/64 recheck 146/72  144/70      Interval HR   1 Minute HR  73  80    2 Minute HR  116  122 test stopped at 1:38    3 Minute HR  104  -     4 Minute HR  116  66    5 Minute HR  119 stopped at 4:24 due desaturation to 79%  -    2 Minute Post HR  80  -    Interval Heart Rate?  Yes  Yes      Interval Oxygen   Interval Oxygen?  Yes  Yes    Baseline Oxygen Saturation %  94 %  97 %    1 Minute Oxygen Saturation %  92 %  80 %    1 Minute Liters of Oxygen  4 L pulsed  4 L pulsed    2 Minute Oxygen Saturation %  83 %  73 % test stopped at 1:38 desaturation at 75%    2 Minute Liters of Oxygen  4 L  4 L    3 Minute Oxygen Saturation %  81 %  90 %    3 Minute Liters of Oxygen  4 L  4 L    4 Minute Oxygen Saturation %  80 % 79% at 4:24  94 %    4 Minute Liters of Oxygen  4 L  4 L    2 Minute Post Oxygen Saturation %  92 %  -    2 Minute Post Liters of Oxygen  4 L  -       Oxygen Initial Assessment: Oxygen Initial Assessment - 01/24/17 1304      Home Oxygen   Home Oxygen Device  Portable Concentrator;Home Concentrator;E-Tanks    Sleep Oxygen Prescription  CPAP    Liters per minute  2.5    Home Exercise Oxygen Prescription  Continuous    Liters per minute  4    Home at Rest Exercise Oxygen Prescription  Continuous    Liters per minute  3    Compliance with Home Oxygen Use  Yes      Initial 6 min Walk   Oxygen Used  Portable Concentrator;Pulsed    Liters per minute  4      Program Oxygen Prescription   Program Oxygen Prescription  Continuous    Liters per minute  4      Intervention   Short Term Goals  To learn and exhibit compliance with exercise, home and travel O2 prescription;To learn and understand importance of maintaining oxygen saturations>88%;To learn and understand importance of monitoring SPO2 with pulse oximeter and demonstrate accurate use of the pulse oximeter.;To learn and demonstrate proper pursed lip breathing techniques or other breathing techniques.    Long  Term Goals  Exhibits compliance with exercise, home and travel O2 prescription;Verbalizes importance of monitoring SPO2 with pulse oximeter and  return demonstration;Maintenance of O2 saturations>88%;Exhibits proper breathing techniques, such as pursed lip breathing or other method taught during program session;Compliance with respiratory medication       Oxygen Re-Evaluation:   Oxygen Discharge (Final Oxygen Re-Evaluation):   Initial Exercise Prescription: Initial Exercise Prescription - 01/24/17 1400      Date of Initial Exercise RX and Referring Provider   Date  01/24/17    Referring Provider  [name] MD      Oxygen   Oxygen  Continuous    Liters  4      Treadmill   MPH  1.5    Grade  0    Minutes  15    METs  2.15      NuStep   Level  1    SPM  80    Minutes  15    METs  1.7  Arm Ergometer   Level  1    RPM  25    Minutes  15    METs  1.7      Prescription Details   Frequency (times per week)  3    Duration  Progress to 45 minutes of aerobic exercise without signs/symptoms of physical distress      Intensity   THRR 40-80% of Max Heartrate  101-126    Ratings of Perceived Exertion  11-13    Perceived Dyspnea  0-4      Progression   Progression  Continue to progress workloads to maintain intensity without signs/symptoms of physical distress.      Resistance Training   Training Prescription  Yes    Weight  3 lbs    Reps  10-15       Perform Capillary Blood Glucose checks as needed.  Exercise Prescription Changes: Exercise Prescription Changes    Row Name 12/20/16 1600 12/28/16 1600 01/12/17 1500 01/24/17 1400       Response to Exercise   Blood Pressure (Admit)  126/64  128/54  124/64  148/84    Blood Pressure (Exercise)  154/74  130/80  -  152/64    Blood Pressure (Exit)  156/64 recheck 146/72  122/64  124/64  144/70    Heart Rate (Admit)  73 bpm  74 bpm  71 bpm  77 bpm    Heart Rate (Exercise)  120 bpm  92 bpm  94 bpm  122 bpm    Heart Rate (Exit)  80 bpm  72 bpm  63 bpm  66 bpm    Oxygen Saturation (Admit)  94 %  96 %  90 %  97 %    Oxygen Saturation (Exercise)  79 %  87  %  87 %  73 %    Oxygen Saturation (Exit)  92 %  99 %  98 %  90 %    Rating of Perceived Exertion (Exercise)  14  11  12  13     Perceived Dyspnea (Exercise)  3  1  1.5  2    Symptoms  SOB  none  none  none    Comments  walk test results  second full day of exercise  -  walk test results    Duration  -  Progress to 45 minutes of aerobic exercise without signs/symptoms of physical distress  Progress to 45 minutes of aerobic exercise without signs/symptoms of physical distress  -    Intensity  -  THRR unchanged  THRR unchanged  -      Progression   Progression  -  Continue to progress workloads to maintain intensity without signs/symptoms of physical distress.  Continue to progress workloads to maintain intensity without signs/symptoms of physical distress.  -    Average METs  -  2.25  2.42  -      Resistance Training   Training Prescription  -  Yes  Yes  -    Weight  -  3 lbs  3 lbs  -    Reps  -  10-15  10-15  -      Interval Training   Interval Training  -  No  No  -      Oxygen   Oxygen  -  Continuous  Continuous  -    Liters  -  3  3  -      Treadmill   MPH  -  1.5  2  -  Grade  -  0.5  0.5  -    Minutes  -  1  15  -    METs  -  2.25  2.67  -      NuStep   Level  -  2  4  -    SPM  -  100  114  -    Minutes  -  15  15  -    METs  -  1.9  2.3  -      REL-XR   Level  -  1  1  -    Speed  -  44  46  -    Minutes  -  15  15  -    METs  -  2.6  2.3  -       Exercise Comments: Exercise Comments    Row Name 12/24/16 1135           Exercise Comments  First full day of exercise!  Patient was oriented to gym and equipment including functions, settings, policies, and procedures.  Patient's individual exercise prescription and treatment plan were reviewed.  All starting workloads were established based on the results of the 6 minute walk test done at initial orientation visit.  The plan for exercise progression was also introduced and progression will be customized based  on patient's performance and goals          Exercise Goals and Review: Exercise Goals    Row Name 12/20/16 1654 01/24/17 1402           Exercise Goals   Increase Physical Activity  Yes  Yes      Intervention  Provide advice, education, support and counseling about physical activity/exercise needs.;Develop an individualized exercise prescription for aerobic and resistive training based on initial evaluation findings, risk stratification, comorbidities and participant's personal goals.  Provide advice, education, support and counseling about physical activity/exercise needs.;Develop an individualized exercise prescription for aerobic and resistive training based on initial evaluation findings, risk stratification, comorbidities and participant's personal goals.      Expected Outcomes  Achievement of increased cardiorespiratory fitness and enhanced flexibility, muscular endurance and strength shown through measurements of functional capacity and personal statement of participant.  Achievement of increased cardiorespiratory fitness and enhanced flexibility, muscular endurance and strength shown through measurements of functional capacity and personal statement of participant.      Increase Strength and Stamina  Yes  Yes      Intervention  Provide advice, education, support and counseling about physical activity/exercise needs.;Develop an individualized exercise prescription for aerobic and resistive training based on initial evaluation findings, risk stratification, comorbidities and participant's personal goals.  Provide advice, education, support and counseling about physical activity/exercise needs.;Develop an individualized exercise prescription for aerobic and resistive training based on initial evaluation findings, risk stratification, comorbidities and participant's personal goals.      Expected Outcomes  Achievement of increased cardiorespiratory fitness and enhanced flexibility, muscular  endurance and strength shown through measurements of functional capacity and personal statement of participant.  Achievement of increased cardiorespiratory fitness and enhanced flexibility, muscular endurance and strength shown through measurements of functional capacity and personal statement of participant.      Able to understand and use rate of perceived exertion (RPE) scale  Yes  Yes      Intervention  Provide education and explanation on how to use RPE scale  Provide education and explanation on how to use RPE scale  Expected Outcomes  Short Term: Able to use RPE daily in rehab to express subjective intensity level;Long Term:  Able to use RPE to guide intensity level when exercising independently  Short Term: Able to use RPE daily in rehab to express subjective intensity level;Long Term:  Able to use RPE to guide intensity level when exercising independently      Able to understand and use Dyspnea scale  Yes  Yes      Intervention  Provide education and explanation on how to use Dyspnea scale  Provide education and explanation on how to use Dyspnea scale      Expected Outcomes  Short Term: Able to use Dyspnea scale daily in rehab to express subjective sense of shortness of breath during exertion;Long Term: Able to use Dyspnea scale to guide intensity level when exercising independently  Short Term: Able to use Dyspnea scale daily in rehab to express subjective sense of shortness of breath during exertion;Long Term: Able to use Dyspnea scale to guide intensity level when exercising independently      Knowledge and understanding of Target Heart Rate Range (THRR)  Yes  Yes      Intervention  Provide education and explanation of THRR including how the numbers were predicted and where they are located for reference  Provide education and explanation of THRR including how the numbers were predicted and where they are located for reference      Expected Outcomes  Short Term: Able to state/look up  THRR;Long Term: Able to use THRR to govern intensity when exercising independently;Short Term: Able to use daily as guideline for intensity in rehab  Short Term: Able to state/look up THRR;Long Term: Able to use THRR to govern intensity when exercising independently;Short Term: Able to use daily as guideline for intensity in rehab      Able to check pulse independently  Yes  Yes      Intervention  Provide education and demonstration on how to check pulse in carotid and radial arteries.;Review the importance of being able to check your own pulse for safety during independent exercise  Provide education and demonstration on how to check pulse in carotid and radial arteries.;Review the importance of being able to check your own pulse for safety during independent exercise      Expected Outcomes  Long Term: Able to check pulse independently and accurately;Short Term: Able to explain why pulse checking is important during independent exercise  Long Term: Able to check pulse independently and accurately;Short Term: Able to explain why pulse checking is important during independent exercise      Understanding of Exercise Prescription  Yes  Yes      Intervention  Provide education, explanation, and written materials on patient's individual exercise prescription  Provide education, explanation, and written materials on patient's individual exercise prescription      Expected Outcomes  Short Term: Able to explain program exercise prescription;Long Term: Able to explain home exercise prescription to exercise independently  Short Term: Able to explain program exercise prescription;Long Term: Able to explain home exercise prescription to exercise independently         Exercise Goals Re-Evaluation : Exercise Goals Re-Evaluation    Row Name 12/28/16 1611 01/12/17 1534           Exercise Goal Re-Evaluation   Exercise Goals Review  Increase Strength and Stamina;Increase Physical Activity  Increase Strength and  Stamina;Increase Physical Activity      Comments  Amjad has completed two full days of exercise  in rehab.  He is off to a good start.  We will continue to monitor his progression.   Amjad has continued to do well in rehab.  He has been averaging about two days a week.  He would get more out of it if his attendance was better.  He is up to level 4 on the NuStep.  We will continue to monitor his progress.      Expected Outcomes  Short: Attend rehab classes regularly.  Long: Complete rehab in full this time.   Short: Attend class more regularly.  Long: Exercise at home independently.          Discharge Exercise Prescription (Final Exercise Prescription Changes): Exercise Prescription Changes - 01/24/17 1400      Response to Exercise   Blood Pressure (Admit)  148/84    Blood Pressure (Exercise)  152/64    Blood Pressure (Exit)  144/70    Heart Rate (Admit)  77 bpm    Heart Rate (Exercise)  122 bpm    Heart Rate (Exit)  66 bpm    Oxygen Saturation (Admit)  97 %    Oxygen Saturation (Exercise)  73 %    Oxygen Saturation (Exit)  90 %    Rating of Perceived Exertion (Exercise)  13    Perceived Dyspnea (Exercise)  2    Symptoms  none    Comments  walk test results       Nutrition:  Target Goals: Understanding of nutrition guidelines, daily intake of sodium 1500mg , cholesterol 200mg , calories 30% from fat and 7% or less from saturated fats, daily to have 5 or more servings of fruits and vegetables.  Biometrics: Pre Biometrics - 01/24/17 1403      Pre Biometrics   Height  5' 6.1" (1.679 m)    Weight  155 lb 9.6 oz (70.6 kg)    Waist Circumference  37.5 inches    Hip Circumference  36 inches    Waist to Hip Ratio  1.04 %    BMI (Calculated)  25.04    Single Leg Stand  18.09 seconds        Nutrition Therapy Plan and Nutrition Goals: Nutrition Therapy & Goals - 01/24/17 1337      Intervention Plan   Intervention  Prescribe, educate and counsel regarding individualized specific  dietary modifications aiming towards targeted core components such as weight, hypertension, lipid management, diabetes, heart failure and other comorbidities.;Nutrition handout(s) given to patient.    Expected Outcomes  Short Term Goal: Understand basic principles of dietary content, such as calories, fat, sodium, cholesterol and nutrients.;Short Term Goal: A plan has been developed with personal nutrition goals set during dietitian appointment.;Long Term Goal: Adherence to prescribed nutrition plan.       Nutrition Discharge: Rate Your Plate Scores: Nutrition Assessments - 01/24/17 1338      MEDFICTS Scores   Pre Score  -- will bring in to first session   will bring in to first session      Nutrition Goals Re-Evaluation:   Nutrition Goals Discharge (Final Nutrition Goals Re-Evaluation):   Psychosocial: Target Goals: Acknowledge presence or absence of significant depression and/or stress, maximize coping skills, provide positive support system. Participant is able to verbalize types and ability to use techniques and skills needed for reducing stress and depression.   Initial Review & Psychosocial Screening: Initial Psych Review & Screening - 01/24/17 1338      Initial Review   Current issues with  Current Depression;Current Stress Concerns  Source of Stress Concerns  Chronic Illness;Unable to perform yard/household activities    Comments  He is depressed due to his recent health decline first with pulmonary issues and now with having the blockage in his heart and stent placement. He feels he can not do simple things without being SOB or tired.       Family Dynamics   Good Support System?  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 His wife was with him today at his appointment and is supportive of him beginning the cardiac rehab program.       Barriers   Psychosocial barriers to participate in program  The patient should benefit from training in  stress management and relaxation.      Screening Interventions   Interventions  Yes;Encouraged to exercise;Program counselor consult;To provide support and resources with identified psychosocial needs;Provide feedback about the scores to participant    Expected Outcomes  Short Term goal: Utilizing psychosocial counselor, staff and physician to assist with identification of specific Stressors or current issues interfering with healing process. Setting desired goal for each stressor or current issue identified.;Long Term Goal: Stressors or current issues are controlled or eliminated.;Short Term goal: Identification and review with participant of any Quality of Life or Depression concerns found by scoring the questionnaire.;Long Term goal: The participant improves quality of Life and PHQ9 Scores as seen by post scores and/or verbalization of changes       Quality of Life Scores:  Quality of Life - 01/24/17 1340      Quality of Life Scores   Health/Function Pre  17.11 %    Socioeconomic Pre  26 %    Psych/Spiritual Pre  18.57 %    Family Pre  29 %    GLOBAL Pre  21.11 %       PHQ-9: Recent Review Flowsheet Data    Depression screen [facility] 2/9 01/24/2017 12/20/2016 04/26/2016 12/24/2015 08/26/2015   Decreased Interest 1 2 0 0 0   Down, Depressed, Hopeless 1 1 0 0 0   PHQ - 2 Score 2 3 0 0 0   Altered sleeping 3 0 - - -   Tired, decreased energy 1 1 - - -   Change in appetite 3 1 - - -   Feeling bad or failure about yourself  3 1 - - -   Trouble concentrating 0 0 - - -   Moving slowly or fidgety/restless 0 0 - - -   Suicidal thoughts 0 0 - - -   PHQ-9 Score 12 6 - - -   Difficult doing work/chores Somewhat difficult Somewhat difficult - - -     Interpretation of Total Score  Total Score Depression Severity:  1-4 = Minimal depression, 5-9 = Mild depression, 10-14 = Moderate depression, 15-19 = Moderately severe depression, 20-27 = Severe depression   Psychosocial Evaluation and  Intervention:   Psychosocial Re-Evaluation:   Psychosocial Discharge (Final Psychosocial Re-Evaluation):   Vocational Rehabilitation: Provide vocational rehab assistance to qualifying candidates.   Vocational Rehab Evaluation & Intervention: Vocational Rehab - 01/24/17 1303      Initial Vocational Rehab Evaluation & Intervention   Assessment shows need for Vocational Rehabilitation  No       Education: Education Goals: Education classes will be provided on a variety of topics geared toward better understanding of heart health and risk factor modification. Participant will state understanding/return demonstration of topics presented as noted by  education test scores.  Learning Barriers/Preferences: Learning Barriers/Preferences - 01/24/17 1302      Learning Barriers/Preferences   Learning Barriers  Hearing has hearing aid but does not wear them often   has hearing aid but does not wear them often   Learning Preferences  Audio;Written Material       Education Topics: General Nutrition Guidelines/Fats and Fiber: -Group instruction provided by verbal, written material, models and posters to present the general guidelines for heart healthy nutrition. Gives an explanation and review of dietary fats and fiber.   Pulmonary Rehab from 01/10/2017 in [facility] Cardiac and Pulmonary Rehab  Date  01/03/17  Educator  CR  Instruction Review Code  1- Verbalizes Understanding      Controlling Sodium/Reading Food Labels: -Group verbal and written material supporting the discussion of sodium use in heart healthy nutrition. Review and explanation with models, verbal and written materials for utilization of the food label.   Pulmonary Rehab from 01/10/2017 in [facility] Cardiac and Pulmonary Rehab  Date  01/10/17  Educator  CR  Instruction Review Code  1- Verbalizes Understanding      Exercise Physiology & Risk Factors: - Group verbal and written instruction with models to review the exercise  physiology of the cardiovascular system and associated critical values. Details cardiovascular disease risk factors and the goals associated with each risk factor.   Pulmonary Rehab from 01/10/2017 in [facility] Cardiac and Pulmonary Rehab  Date  12/24/16  Educator  Roney Jaffe, EP  Instruction Review Code  2- Demonstrated Understanding      Aerobic Exercise & Resistance Training: - Gives group verbal and written discussion on the health impact of inactivity. On the components of aerobic and resistive training programs and the benefits of this training and how to safely progress through these programs.   Flexibility, Balance, General Exercise Guidelines: - Provides group verbal and written instruction on the benefits of flexibility and balance training programs. Provides general exercise guidelines with specific guidelines to those with heart or lung disease. Demonstration and skill practice provided.   Pulmonary Rehab from 07/28/2015 in [facility] Cardiac and Pulmonary Rehab  Date  05/07/15  Educator  RM  Instruction Review Code (retired)  2- meets goals/outcomes      Stress Management: - Provides group verbal and written instruction about the health risks of elevated stress, cause of high stress, and healthy ways to reduce stress.   Pulmonary Rehab from 07/28/2015 in [facility] Cardiac and Pulmonary Rehab  Date  05/14/15  Educator  [facility]  Instruction Review Code (retired)  2- meets goals/outcomes      Depression: - Provides group verbal and written instruction on the correlation between heart/lung disease and depressed mood, treatment options, and the stigmas associated with seeking treatment.   Anatomy & Physiology of the Heart: - Group verbal and written instruction and models provide basic cardiac anatomy and physiology, with the coronary electrical and arterial systems. Review of: AMI, Angina, Valve disease, Heart Failure, Cardiac Arrhythmia, Pacemakers, and the ICD.   Cardiac Procedures: -  Group verbal and written instruction to review commonly prescribed medications for heart disease. Reviews the medication, class of the drug, and side effects. Includes the steps to properly store meds and maintain the prescription regimen. (beta blockers and nitrates)   Cardiac Medications I: - Group verbal and written instruction to review commonly prescribed medications for heart disease. Reviews the medication, class of the drug, and side effects. Includes the steps to properly store meds and maintain the prescription regimen.  [facility]  Date  04/25/15  Educator  CE  Instruction Review Code (retired)  2- meets goals/outcomes      Cardiac Medications II: -Group verbal and written instruction to review commonly prescribed medications for heart disease. Reviews the medication, class of the drug, and side effects. (all other drug classes)   [facility]  Date  12/31/16  Educator  [facility]  Instruction Review Code  1- Verbalizes Understanding       Go Sex-Intimacy & Heart Disease, Get SMART - Goal Setting: - Group verbal and written instruction through game format to discuss heart disease and the return to sexual intimacy. Provides group verbal and written material to discuss and apply goal setting through the application of the S.M.A.R.T. Method.   Other Matters of the Heart: - Provides group verbal, written materials and models to describe Heart Failure, Angina, Valve Disease, Peripheral Artery Disease, and Diabetes in the realm of heart disease. Includes description of the disease process and treatment options available to the cardiac patient.   Exercise & Equipment Safety: - Individual verbal instruction and demonstration of equipment use and safety with use of the equipment.   [facility]  Date  01/24/17  Educator  KS   Instruction Review Code  1- Verbalizes Understanding      Infection Prevention: - Provides verbal and written material to individual with discussion of infection control including proper hand washing and proper equipment cleaning during exercise session.   [facility]  Date  01/24/17  Educator  KS  Instruction Review Code  1- Verbalizes Understanding      Falls Prevention: - Provides verbal and written material to individual with discussion of falls prevention and safety.   [facility]  Date  01/24/17  Educator  KS  Instruction Review Code  1- Verbalizes Understanding      Diabetes: - Individual verbal and written instruction to review signs/symptoms of diabetes, desired ranges of glucose level fasting, after meals and with exercise. Acknowledge that pre and post exercise glucose checks will be done for 3 sessions at entry of program.   Other: -Provides group and verbal instruction on various topics (see comments)    Knowledge Questionnaire Score: Knowledge Questionnaire Score - 01/24/17 1353      Knowledge Questionnaire Score   Pre Score  24/28 Reviewed correct answeres with patient who verbalied understanding.   Reviewed correct answeres with patient who verbalied understanding.      Core Components/Risk Factors/Patient Goals at Admission: Personal Goals and Risk Factors at Admission - 01/24/17 1335      Core Components/Risk Factors/Patient Goals on Admission    Weight Management  Yes;Weight Maintenance    Intervention  Weight Management: Develop a combined nutrition and exercise program designed to reach desired caloric intake, while maintaining appropriate intake of nutrient and fiber, sodium and fats, and appropriate energy expenditure required for the weight goal.;Weight Management: Provide education and appropriate resources to help participant work on and  attain dietary goals.;Weight Management/Obesity: Establish reasonable short term and long term weight goals.    Admit Weight  155 lb 9.6 oz (70.6 kg)    Goal Weight: Short Term  150 lb (68 kg)    Goal Weight: Long Term  150 lb (68 kg)    Expected  Outcomes  Weight Maintenance: Understanding of the daily nutrition guidelines, which includes 25-35% calories from fat, 7% or less cal from saturated fats, less than 200mg  cholesterol, less than 1.5gm of sodium, & 5 or more servings of fruits and vegetables daily;Long Term: Adherence to nutrition and physical activity/exercise program aimed toward attainment of established weight goal;Short Term: Continue to assess and modify interventions until short term weight is achieved;Understanding recommendations for meals to include 15-35% energy as protein, 25-35% energy from fat, 35-60% energy from carbohydrates, less than 200mg  of dietary cholesterol, 20-35 gm of total fiber daily;Understanding of distribution of calorie intake throughout the day with the consumption of 4-5 meals/snacks    Improve shortness of breath with ADL's  Yes Patient would like to be able to walk and do things around the house without getting so short of breath   Patient would like to be able to walk and do things around the house without getting so short of breath   Intervention  Provide education, individualized exercise plan and daily activity instruction to help decrease symptoms of SOB with activities of daily living.    Expected Outcomes  Short Term: Achieves a reduction of symptoms when performing activities of daily living.    Develop more efficient breathing techniques such as purse lipped breathing and diaphragmatic breathing; and practicing self-pacing with activity  Yes    Intervention  Provide education, demonstration and support about specific breathing techniuqes utilized for more efficient breathing. Include techniques such as pursed lipped breathing, diaphragmatic breathing and  self-pacing activity.    Expected Outcomes  Short Term: Participant will be able to demonstrate and use breathing techniques as needed throughout daily activities.    Increase knowledge of respiratory medications and ability to use respiratory devices properly   Yes    Intervention  Provide education and demonstration as needed of appropriate use of medications, inhalers, and oxygen therapy.    Expected Outcomes  Short Term: Achieves understanding of medications use. Understands that oxygen is a medication prescribed by physician. Demonstrates appropriate use of inhaler and oxygen therapy.    Hypertension  Yes    Intervention  Provide education on lifestyle modifcations including regular physical activity/exercise, weight management, moderate sodium restriction and increased consumption of fresh fruit, vegetables, and low fat dairy, alcohol moderation, and smoking cessation.;Monitor prescription use compliance.    Expected Outcomes  Short Term: Continued assessment and intervention until BP is < 140/11mm HG in hypertensive participants. < 130/38mm HG in hypertensive participants with diabetes, heart failure or chronic kidney disease.;Long Term: Maintenance of blood pressure at goal levels.    Lipids  Yes    Intervention  Provide education and support for participant on nutrition & aerobic/resistive exercise along with prescribed medications to achieve LDL 70mg , HDL >40mg .    Expected Outcomes  Short Term: Participant states understanding of desired cholesterol values and is compliant with medications prescribed. Participant is following exercise prescription and nutrition guidelines.;Long Term: Cholesterol controlled with medications as prescribed, with individualized exercise RX and with personalized nutrition plan. Value goals: LDL < 70mg , HDL > 40 mg.    Stress  Yes    Intervention  Offer individual and/or small group education and counseling on adjustment to heart disease, stress management and  health-related lifestyle change. Teach and support self-help strategies.;Refer participants experiencing significant psychosocial distress to appropriate mental health specialists for further evaluation and treatment. When possible, include family members and significant others in education/counseling sessions.    Expected Outcomes  Short Term: Participant demonstrates  changes in health-related behavior, relaxation and other stress management skills, ability to obtain effective social support, and compliance with psychotropic medications if prescribed.;Long Term: Emotional wellbeing is indicated by absence of clinically significant psychosocial distress or social isolation.       Core Components/Risk Factors/Patient Goals Review:    Core Components/Risk Factors/Patient Goals at Discharge (Final Review):    ITP Comments: ITP Comments    Row Name 12/20/16 1638 01/10/17 0830 01/24/17 1324 01/26/17 0639     ITP Comments  Medical Evaluation completed. Chart sent to Dr. Bethann PunchesMark Miller Director of [facility]. Medical Diagnosis can be found in [facility] encounter 12/20/16  30 day review completed. ITP sent to Dr. Bethann PunchesMark Miller Director of [name]. Continue with ITP unless changes are made by physician.    Medical Review Completed; initial ITP created. Diagnosis Documentation can be found in [facility] note dated 01/20/2017.  30 day review. Continue with ITP unless directed changes per Medical Director review.  New to program       Comments:

## 2017-01-26 NOTE — Progress Notes (Signed)
Daily Session Note  Patient Details  Name: Vincent Kennedy MRN: [id number] Date of Birth: [birth year] Referring Provider:     Cardiac Rehab from 01/24/2017 in [facility] Cardiac and Pulmonary Rehab  Referring Provider  Vincent Sacramento MD      Encounter Date: 01/26/2017  Check In: Session Check In - 01/26/17 0733      Check-In   Location  [facility]-Cardiac & Pulmonary Rehab    Staff Present  Justin Mend RCP,RRT,BSRT;Heath Lark, RN, BSN, CCRP;Jessica Luan Pulling, MA, ACSM RCEP, Exercise Physiologist    Supervising physician immediately available to respond to emergencies  See telemetry face sheet for immediately available ER MD    Medication changes reported      No    Fall or balance concerns reported     No    Warm-up and Cool-down  Performed on first and last piece of equipment    Resistance Training Performed  Yes    VAD Patient?  No      Pain Assessment   Currently in Pain?  No/denies          Social History   Tobacco Use  Smoking Status Never Smoker  Smokeless Tobacco Never Used    Goals Met:  Exercise tolerated well No report of cardiac concerns or symptoms Strength training completed today  Goals Unmet:  Not Applicable  Comments: First full day of exercise!  Patient was oriented to gym and equipment including functions, settings, policies, and procedures.  Patient's individual exercise prescription and treatment plan were reviewed.  All starting workloads were established based on the results of the 6 minute walk test done at initial orientation visit.  The plan for exercise progression was also introduced and progression will be customized based on patient's performance and goals. [name] oxygen saturation was 83% on the treadmill on 4 liters. He needed to be on 6 liters to get his oxygen above 88%.   Dr. Emily Filbert is Medical Director for [place] and [name].

## 2017-01-28 ENCOUNTER — Encounter: Payer: Medicare Other | Admitting: *Deleted

## 2017-01-28 DIAGNOSIS — Z7982 Long term (current) use of aspirin: Secondary | ICD-10-CM | POA: Diagnosis not present

## 2017-01-28 DIAGNOSIS — Z955 Presence of coronary angioplasty implant and graft: Secondary | ICD-10-CM

## 2017-01-28 DIAGNOSIS — G4730 Sleep apnea, unspecified: Secondary | ICD-10-CM | POA: Diagnosis not present

## 2017-01-28 DIAGNOSIS — J8410 Pulmonary fibrosis, unspecified: Secondary | ICD-10-CM | POA: Diagnosis not present

## 2017-01-28 DIAGNOSIS — E785 Hyperlipidemia, unspecified: Secondary | ICD-10-CM | POA: Diagnosis not present

## 2017-01-28 DIAGNOSIS — Z7951 Long term (current) use of inhaled steroids: Secondary | ICD-10-CM | POA: Diagnosis not present

## 2017-01-28 DIAGNOSIS — I2510 Atherosclerotic heart disease of native coronary artery without angina pectoris: Secondary | ICD-10-CM | POA: Diagnosis not present

## 2017-01-28 DIAGNOSIS — I10 Essential (primary) hypertension: Secondary | ICD-10-CM | POA: Diagnosis not present

## 2017-01-28 DIAGNOSIS — Z79899 Other long term (current) drug therapy: Secondary | ICD-10-CM | POA: Diagnosis not present

## 2017-01-28 NOTE — Progress Notes (Signed)
Daily Session Note  Patient Details  Name: Vincent Kennedy MRN: [id number] Date of Birth: 10/15/1932 Referring Provider:     Cardiac Rehab from 01/24/2017 in [facility] Cardiac and Pulmonary Rehab  Referring Provider  Arida, Muhammad MD      Encounter Date: 01/28/2017  Check In: Session Check In - 01/28/17 0756      Check-In   Location  [facility]-Cardiac & Pulmonary Rehab    Staff Present  Carroll Enterkin, RN, BSN;Amanda Sommer, BA, ACSM CEP, Exercise Physiologist;Jessica Hawkins, MA, ACSM RCEP, Exercise Physiologist    Supervising physician immediately available to respond to emergencies  See telemetry face sheet for immediately available ER MD    Medication changes reported      No    Fall or balance concerns reported     No    Warm-up and Cool-down  Performed on first and last piece of equipment    Resistance Training Performed  Yes    VAD Patient?  No      Pain Assessment   Currently in Pain?  No/denies          Social History   Tobacco Use  Smoking Status Never Smoker  Smokeless Tobacco Never Used    Goals Met:  Proper associated with RPD/PD & O2 Sat Independence with exercise equipment Using PLB without cueing & demonstrates good technique Exercise tolerated well No report of cardiac concerns or symptoms Strength training completed today  Goals Unmet:  Not Applicable  Comments: Pt able to follow exercise prescription today without complaint.  Will continue to monitor for progression. Reviewed home exercise with pt today.  Pt plans to walk on treadmill at home for exercise.  Reviewed THR, pulse, RPE, sign and symptoms, NTG use, and when to call 911 or MD.  Also discussed weather considerations and indoor options.  Pt voiced understanding.    Dr. Mark Miller is Medical Director for [facility] and [name]. 

## 2017-01-31 ENCOUNTER — Encounter: Payer: Medicare Other | Admitting: *Deleted

## 2017-01-31 ENCOUNTER — Telehealth: Payer: Self-pay

## 2017-01-31 DIAGNOSIS — Z955 Presence of coronary angioplasty implant and graft: Secondary | ICD-10-CM | POA: Diagnosis not present

## 2017-01-31 DIAGNOSIS — J8410 Pulmonary fibrosis, unspecified: Secondary | ICD-10-CM | POA: Diagnosis not present

## 2017-01-31 DIAGNOSIS — Z7982 Long term (current) use of aspirin: Secondary | ICD-10-CM | POA: Diagnosis not present

## 2017-01-31 DIAGNOSIS — I2510 Atherosclerotic heart disease of native coronary artery without angina pectoris: Secondary | ICD-10-CM | POA: Diagnosis not present

## 2017-01-31 DIAGNOSIS — Z7951 Long term (current) use of inhaled steroids: Secondary | ICD-10-CM | POA: Diagnosis not present

## 2017-01-31 DIAGNOSIS — I10 Essential (primary) hypertension: Secondary | ICD-10-CM | POA: Diagnosis not present

## 2017-01-31 DIAGNOSIS — E785 Hyperlipidemia, unspecified: Secondary | ICD-10-CM | POA: Diagnosis not present

## 2017-01-31 DIAGNOSIS — G4730 Sleep apnea, unspecified: Secondary | ICD-10-CM | POA: Diagnosis not present

## 2017-01-31 DIAGNOSIS — Z79899 Other long term (current) drug therapy: Secondary | ICD-10-CM | POA: Diagnosis not present

## 2017-01-31 NOTE — Telephone Encounter (Signed)
-----   Message from [place] OuchMuhammad A Arida, MD sent at 01/30/2017  7:10 AM EST ----- Ok that's fine.    ----- Message ----- From: Gaynell FaceHood, Joseph A, RRT Sent: 01/26/2017   8:55 AM To: [place] OuchMuhammad A Arida, MD  Dr. Leretha DykesAArida and Dr. Welton FlakesKhan ,  Vincent Kennedy's oxygen saturation was 83% on the treadmill on 4 liters. He needed to be on 6 liters to get his oxygen above 88%. I was following up with you to get an order for 6 liters while exercising.    Thank you   Elige KoJoseph Hood RRT

## 2017-01-31 NOTE — Progress Notes (Signed)
Daily Session Note  Patient Details  Name: Vincent Kennedy MRN: [id number] Date of Birth: Nov 02, 1932 Referring Provider:     Cardiac Rehab from 01/24/2017 in [facility] Cardiac and Pulmonary Rehab  Referring Provider  Kathlyn Sacramento MD      Encounter Date: 01/31/2017  Check In: Session Check In - 01/31/17 0749      Check-In   Location  [facility]-Cardiac & Pulmonary Rehab    Staff Present  Gerlene Burdock, RN, Moises Blood, BS, ACSM CEP, Exercise Physiologist;Kirill Chatterjee Luan Pulling, [place], ACSM RCEP, Exercise Physiologist    Supervising physician immediately available to respond to emergencies  See telemetry face sheet for immediately available ER MD    Medication changes reported      No    Fall or balance concerns reported     No    Warm-up and Cool-down  Performed on first and last piece of equipment    Resistance Training Performed  Yes    VAD Patient?  No      Pain Assessment   Currently in Pain?  No/denies          Social History   Tobacco Use  Smoking Status Never Smoker  Smokeless Tobacco Never Used    Goals Met:  Independence with exercise equipment Exercise tolerated well Queuing for purse lip breathing No report of cardiac concerns or symptoms Strength training completed today  Goals Unmet:  Not Applicable  Comments: Pt able to follow exercise prescription today without complaint.  Will continue to monitor for progression.    Dr. Emily Filbert is Medical Director for [place] and [name].

## 2017-02-02 DIAGNOSIS — Z79899 Other long term (current) drug therapy: Secondary | ICD-10-CM | POA: Diagnosis not present

## 2017-02-02 DIAGNOSIS — J8410 Pulmonary fibrosis, unspecified: Secondary | ICD-10-CM | POA: Diagnosis not present

## 2017-02-02 DIAGNOSIS — I10 Essential (primary) hypertension: Secondary | ICD-10-CM | POA: Diagnosis not present

## 2017-02-02 DIAGNOSIS — Z7982 Long term (current) use of aspirin: Secondary | ICD-10-CM | POA: Diagnosis not present

## 2017-02-02 DIAGNOSIS — G4730 Sleep apnea, unspecified: Secondary | ICD-10-CM | POA: Diagnosis not present

## 2017-02-02 DIAGNOSIS — E785 Hyperlipidemia, unspecified: Secondary | ICD-10-CM | POA: Diagnosis not present

## 2017-02-02 DIAGNOSIS — Z955 Presence of coronary angioplasty implant and graft: Secondary | ICD-10-CM

## 2017-02-02 DIAGNOSIS — Z7951 Long term (current) use of inhaled steroids: Secondary | ICD-10-CM | POA: Diagnosis not present

## 2017-02-02 DIAGNOSIS — I2510 Atherosclerotic heart disease of native coronary artery without angina pectoris: Secondary | ICD-10-CM | POA: Diagnosis not present

## 2017-02-02 NOTE — Progress Notes (Signed)
Daily Session Note  Patient Details  Name: Vincent Kennedy MRN: [id number] Date of Birth: Jan 31, 1933 Referring Provider:     Cardiac Rehab from 01/24/2017 in [facility] Cardiac and Pulmonary Rehab  Referring Provider  Kathlyn Sacramento MD      Encounter Date: 02/02/2017  Check In: Session Check In - 02/02/17 0730      Check-In   Location  [facility]-Cardiac & Pulmonary Rehab    Staff Present  Justin Mend RCP,RRT,BSRT;Krista Frederico Hamman, RN BSN;Jessica Luan Pulling, MA, ACSM RCEP, Exercise Physiologist    Supervising physician immediately available to respond to emergencies  See telemetry face sheet for immediately available ER MD    Medication changes reported      No    Fall or balance concerns reported     No    Warm-up and Cool-down  Performed on first and last piece of equipment    Resistance Training Performed  Yes    VAD Patient?  No      Pain Assessment   Currently in Pain?  No/denies        Exercise Prescription Changes - 02/01/17 1300      Response to Exercise   Blood Pressure (Admit)  134/72    Blood Pressure (Exercise)  134/72    Blood Pressure (Exit)  124/50    Heart Rate (Admit)  63 bpm    Heart Rate (Exercise)  103 bpm    Heart Rate (Exit)  60 bpm    Oxygen Saturation (Exercise)  84 %    Rating of Perceived Exertion (Exercise)  12    Perceived Dyspnea (Exercise)  2    Symptoms  none    Duration  Continue with 45 min of aerobic exercise without signs/symptoms of physical distress.    Intensity  THRR unchanged      Progression   Progression  Continue to progress workloads to maintain intensity without signs/symptoms of physical distress.    Average METs  2.32      Resistance Training   Training Prescription  Yes    Weight  3 lbs    Reps  10-15      Interval Training   Interval Training  No      Oxygen   Oxygen  Continuous    Liters  6      Treadmill   MPH  1.5    Grade  0    Minutes  15    METs  2.15      NuStep   Level  4    Minutes  15    METs  2.9       Arm Ergometer   Level  1    Minutes  15    METs  1.9      Home Exercise Plan   Plans to continue exercise at  Home (comment) walking on treadmill    Frequency  Add 1 additional day to program exercise sessions.    Initial Home Exercises Provided  01/28/17       Social History   Tobacco Use  Smoking Status Never Smoker  Smokeless Tobacco Never Used    Goals Met:  Exercise tolerated well No report of cardiac concerns or symptoms Strength training completed today  Goals Unmet:  Not Applicable  Comments: Pt able to follow exercise prescription today without complaint.  Will continue to monitor for progression.   Dr. Emily Filbert is Medical Director for [place] and [name].

## 2017-02-03 ENCOUNTER — Other Ambulatory Visit: Payer: Self-pay | Admitting: Cardiovascular Disease

## 2017-02-04 ENCOUNTER — Ambulatory Visit: Payer: Medicare Other | Admitting: Cardiovascular Disease

## 2017-02-04 ENCOUNTER — Encounter: Payer: Self-pay | Admitting: Cardiovascular Disease

## 2017-02-04 ENCOUNTER — Ambulatory Visit (INDEPENDENT_AMBULATORY_CARE_PROVIDER_SITE_OTHER): Payer: Medicare Other | Admitting: Cardiovascular Disease

## 2017-02-04 VITALS — BP 104/60 | HR 74 | Ht 68.0 in | Wt 155.0 lb

## 2017-02-04 DIAGNOSIS — I10 Essential (primary) hypertension: Secondary | ICD-10-CM

## 2017-02-04 DIAGNOSIS — Z955 Presence of coronary angioplasty implant and graft: Secondary | ICD-10-CM | POA: Diagnosis not present

## 2017-02-04 DIAGNOSIS — E785 Hyperlipidemia, unspecified: Secondary | ICD-10-CM | POA: Diagnosis not present

## 2017-02-04 DIAGNOSIS — G4730 Sleep apnea, unspecified: Secondary | ICD-10-CM | POA: Diagnosis not present

## 2017-02-04 DIAGNOSIS — Z7951 Long term (current) use of inhaled steroids: Secondary | ICD-10-CM | POA: Diagnosis not present

## 2017-02-04 DIAGNOSIS — I2510 Atherosclerotic heart disease of native coronary artery without angina pectoris: Secondary | ICD-10-CM | POA: Diagnosis not present

## 2017-02-04 DIAGNOSIS — Z7982 Long term (current) use of aspirin: Secondary | ICD-10-CM | POA: Diagnosis not present

## 2017-02-04 DIAGNOSIS — E7800 Pure hypercholesterolemia, unspecified: Secondary | ICD-10-CM | POA: Diagnosis not present

## 2017-02-04 DIAGNOSIS — J8410 Pulmonary fibrosis, unspecified: Secondary | ICD-10-CM

## 2017-02-04 DIAGNOSIS — Z79899 Other long term (current) drug therapy: Secondary | ICD-10-CM | POA: Diagnosis not present

## 2017-02-04 NOTE — Progress Notes (Signed)
Daily Session Note  Patient Details  Name: Vincent Kennedy MRN: [id number] Date of Birth: Jul 03, 1932 Referring Provider:     Cardiac Rehab from 01/24/2017 in [facility] Cardiac and Pulmonary Rehab  Referring Provider  Kathlyn Sacramento MD      Encounter Date: 02/04/2017  Check In: Session Check In - 02/04/17 0842      Check-In   Location  [facility]-Cardiac & Pulmonary Rehab    Staff Present  Nada Maclachlan, BA, ACSM CEP, Exercise Physiologist;Carroll Enterkin, RN, Levie Heritage, MA, ACSM RCEP, Exercise Physiologist    Supervising physician immediately available to respond to emergencies  See telemetry face sheet for immediately available ER MD    Medication changes reported      No    Fall or balance concerns reported     No    Warm-up and Cool-down  Performed on first and last piece of equipment    Resistance Training Performed  Yes    VAD Patient?  No      Pain Assessment   Currently in Pain?  No/denies    Multiple Pain Sites  No          Social History   Tobacco Use  Smoking Status Never Smoker  Smokeless Tobacco Never Used    Goals Met:  Independence with exercise equipment Exercise tolerated well No report of cardiac concerns or symptoms Strength training completed today  Goals Unmet:  Not Applicable  Comments: Pt able to follow exercise prescription today without complaint.  Will continue to monitor for progression.    Dr. Emily Filbert is Medical Director for [place] and [name].

## 2017-02-04 NOTE — Patient Instructions (Signed)
Medication Instructions: Continue same medications.   Labwork: None.   Procedures/Testing: None.   Follow-Up: 1 month with Dr. Carleen Rhue.   Any Additional Special Instructions Will Be Listed Below (If Applicable).     If you need a refill on your cardiac medications before your next appointment, please call your pharmacy.   

## 2017-02-04 NOTE — Progress Notes (Signed)
Cardiology Office Note   Date:  02/04/2017   ID:  Vincent Kennedy, DOB 1932-12-14, MRN [id number]  PCP:  [facility]  Cardiologist:   [name], MD   Chief Complaint  Patient presents with  . OTHER    Post cardiac cath. Meds reviewed verbally with pt.      History of Present Illness: Vincent Kennedy is a 83 y.o. male who Is here today for follow-up visit regarding coronary artery disease. He is a retired Development worker, international [employer].  He had previous first diagonal ostial drug-eluting stent placement in 2008. He was diagnosed with pulmonary fibrosis in 2016. He is on continuous oxygen therapy.  He recently had worsening exertional dyspnea in spite of stable pulmonary function testing findings.  I proceeded with a right and left cardiac catheterization which showed severe one-vessel coronary artery disease with patent diagonal stent, 95% heavily calcified stenosis in the mid LAD at the origin of the second diagonal and moderate proximal LAD stenosis.  Ejection fraction was normal.  Right heart catheterization showed normal filling pressures, mild pulmonary hypertension and normal cardiac output.  Saturation on showed evidence of a step up at the atrial level suggestive of a possible ASD.  However, IVC sat was not obtained which could have affected the results.  The patient return for a staged LAD PCI with planned atherectomy.  However, I could not advance the atherectomy device beyond the proximal segment of the LAD as it was catching on the struts of the old first diagonal stent.  Thus, I proceeded with angioplasty and drug-eluting stent placement.  The proximal LAD area had to be treated with a drug-eluting stent placement in order to be able to advance equipment beyond that area. He had a delayed bleeding from the right groin which resulted in drop in hemoglobin but did not require transfusion.  He immediately felt better after PCI but started having shortness of breath  again next day.  We performed an EKG in the office which was unremarkable.  An echocardiogram showed normal LV systolic function with no evidence of pericardial effusion or other complications. He started going to cardiac rehab and he is gradually feeling better.  Past Medical History:  Diagnosis Date  . Abnormal nuclear cardiac imaging test    Dr. [name]  . BPH (benign prostatic hyperplasia)   . Coronary artery disease    Cardiac cath September 2008: 20% left main stenosis, 40% mid LAD stenosis, 99% ostial first diagonal stenosis in a large branch and 90% ostial disease in second diagonal but the vessel was very small, 20% mid RCA stenosis with normal ejection fraction. Successful angioplasty and drug-eluting stent placement to the ostial first diagonal with a 2.5 x 15 mm Xience stent  . GERD (gastroesophageal reflux disease)   . Hyperlipidemia   . Hypertension    BP has never been high(per pt).  Metoprolol is to keep BP low because of stent (afterload)  . On home oxygen therapy    "2-3L; 24/7" (01/19/2017)  . OSA on CPAP   . Oxygen deficiency   . Pneumonia 07/2016  . Pulmonary fibrosis (HCC) 08/26/2015  . Shortness of breath dyspnea    with exertion  . Wears hearing aid    (sometimes)    Past Surgical History:  Procedure Laterality Date  . CATARACT EXTRACTION PHACO AND INTRAOCULAR LENS PLACEMENT (IOC) Left 03/12/2015   Performed by Lockie Mola, MD at [facility]  . CATARACT EXTRACTION PHACO AND INTRAOCULAR LENS  PLACEMENT (IOC) Right 01/29/2015   Performed by Lockie MolaBrasington, Chadwick, MD at [facility]  . COLONOSCOPY WITH ESOPHAGOGASTRODUODENOSCOPY (EGD)    . CORONARY ANGIOPLASTY WITH STENT PLACEMENT  2010   "1 stent"  . CORONARY ANGIOPLASTY WITH STENT PLACEMENT  01/19/2017  . CORONARY ATHERECTOMY - CSI N/A 01/19/2017   Performed by [name], Muhammad A, MD [facility]  . CORONARY STENT INTERVENTION N/A 01/19/2017   Performed by [name], Muhammad A, [facility]  . INGUINAL HERNIA REPAIR Bilateral   . RIGHT/LEFT HEART CATH AND CORONARY ANGIOGRAPHY N/A 01/12/2017   Performed by [name], Muhammad A, [facility]     Current Outpatient Medications  Medication Sig Dispense Refill  . aspirin EC 81 MG tablet Take 1 tablet (81 mg total) by mouth daily. 30 tablet 6  . budesonide (PULMICORT) 0.5 MG/2ML nebulizer solution Take 2 mLs (0.5 mg total) by nebulization 2 (two) times daily. 2 mL 12  . carvedilol (COREG) 6.25 MG tablet Take 1 tablet (6.25 mg total) by mouth 2 (two) times daily. 60 tablet 6  . clopidogrel (PLAVIX) 75 MG tablet Take 1 tablet (75 mg total) by mouth daily. 30 tablet 6  . finasteride (PROSCAR) 5 MG tablet Take 1 tablet (5 mg total) by mouth daily. 90 tablet 1  . mometasone-formoterol (DULERA) 100-5 MCG/ACT AERO Inhale 2 puffs into the lungs 2 (two) times daily. 1 Inhaler 1  . nitroGLYCERIN (NITROSTAT) 0.4 MG SL tablet Place 1 tablet (0.4 mg total) under the tongue every 5 (five) minutes as needed for chest pain. 25 tablet 12  . pantoprazole (PROTONIX) 40 MG tablet Take 1 tablet (40 mg total) by mouth daily. 30 tablet 6  . Pirfenidone (ESBRIET) 267 MG CAPS Take 801 mg by mouth 3 (three) times daily.     . rivastigmine (EXELON) 9.5 mg/24hr Place 1 patch (9.5 mg total) onto the skin daily. (Patient taking differently: Place 9.5 mg onto the skin once a week. ) 30 patch 2  . rosuvastatin (CRESTOR) 40 MG tablet TAKE ONE TABLET EVERY DAY (Patient taking differently: Take 40 mg by mouth every day) 90 tablet 2  . zolpidem (AMBIEN) 5 MG tablet Take 5 mg by mouth at bedtime as needed for sleep.      No current facility-administered medications for this visit.     Allergies:   Patient has no known allergies.    Social History:  The patient  reports that  has never smoked. he has never used smokeless tobacco. He reports that he does not drink alcohol or use drugs.   Family History:  The patient's family history  includes CAD in his brother and father.    ROS:  Please see the history of present illness.   Otherwise, review of systems are positive for none.   All other systems are reviewed and negative.    PHYSICAL EXAM: VS:  BP 104/60 (BP Location: Left Arm, Patient Position: Sitting, Cuff Size: Normal)   Pulse 74   Ht 5\' 8"  (1.727 m)   Wt 155 lb (70.3 kg)   BMI 23.57 kg/m  , BMI Body mass index is 23.57 kg/m. GEN: Well nourished, well developed, in no acute distress  HEENT: normal  Neck: no JVD, carotid bruits, or masses Cardiac: RRR; no murmurs, rubs, or gallops,no edema  Respiratory: Diminished breath sounds with dry crackles. GI: soft, nontender, nondistended, + BS MS: no deformity or atrophy  Skin: warm and dry,  no rash Neuro:  Strength and sensation are intact Psych: euthymic mood, full affect Right groin is intact with no hematoma.  Distal pulses are normal.  EKG:  EKG is ordered today. The ekg ordered today demonstrates normal sinus rhythm with no significant ST or T wave.  There is sinus arrhythmia.   Recent Labs: 08/15/2016: ALT 26; B Natriuretic Peptide 98.0 01/20/2017: BUN 10; Creatinine, Ser 0.76; Hemoglobin 11.6; Platelets 137; Potassium 4.2; Sodium 137    Lipid Panel    Component Value Date/Time   CHOL 149 12/24/2015 1105   CHOL 187 08/26/2015 0931   TRIG 105 12/24/2015 1105   HDL 27 (L) 12/24/2015 1105   HDL 34 (L) 08/26/2015 0931   CHOLHDL 5.5 (H) 12/24/2015 1105   VLDL 21 12/24/2015 1105   LDLCALC 101 12/24/2015 1105   LDLCALC 131 (H) 08/26/2015 0931      Wt Readings from Last 3 Encounters:  02/04/17 155 lb (70.3 kg)  01/24/17 155 lb 9.6 oz (70.6 kg)  01/19/17 155 lb (70.3 kg)     No flowsheet data found.    ASSESSMENT AND PLAN:  1.  Coronary artery disease involving native coronary arteries without angina: He is progressing reasonably well after recent difficult PCI and 2 overlapped drug-eluting stent placement to the proximal and mid LAD.  I  suspect that his postprocedure anemia contributed to worsening dyspnea but he is gradually improving.  I recommend dual antiplatelet therapy for at least one year.  Continue cardiac rehab.  2.  Possible intra-atrial shunt: Right heart catheterization showed saturation step up at the atrial level but IVC saturation was not obtained which might have influenced the results.  I am going to reevaluate his symptoms in about 1 month and consider proceeding with a transesophageal echocardiogram for evaluation.  3. Hyperlipidemia: Currently on rosuvastatin 40 mg once daily.  Recommend target LDL of less than 70.  4. Pulmonary fibrosis: Managed by [facility] and Dr. Welton FlakesKhan.   5. Essential hypertension: Blood pressure is well controlled on carvedilol.     Disposition:   FU with me in 1 months  Signed,  Lorine BearsMuhammad Arida, MD  02/04/2017 9:56 AM    [facility]

## 2017-02-04 NOTE — Telephone Encounter (Signed)
Please advise if ok to refill. 

## 2017-02-09 DIAGNOSIS — Z955 Presence of coronary angioplasty implant and graft: Secondary | ICD-10-CM

## 2017-02-09 DIAGNOSIS — I10 Essential (primary) hypertension: Secondary | ICD-10-CM | POA: Diagnosis not present

## 2017-02-09 DIAGNOSIS — Z79899 Other long term (current) drug therapy: Secondary | ICD-10-CM | POA: Diagnosis not present

## 2017-02-09 DIAGNOSIS — G4730 Sleep apnea, unspecified: Secondary | ICD-10-CM | POA: Diagnosis not present

## 2017-02-09 DIAGNOSIS — E785 Hyperlipidemia, unspecified: Secondary | ICD-10-CM | POA: Diagnosis not present

## 2017-02-09 DIAGNOSIS — Z7951 Long term (current) use of inhaled steroids: Secondary | ICD-10-CM | POA: Diagnosis not present

## 2017-02-09 DIAGNOSIS — I2510 Atherosclerotic heart disease of native coronary artery without angina pectoris: Secondary | ICD-10-CM | POA: Diagnosis not present

## 2017-02-09 DIAGNOSIS — J8410 Pulmonary fibrosis, unspecified: Secondary | ICD-10-CM | POA: Diagnosis not present

## 2017-02-09 DIAGNOSIS — Z7982 Long term (current) use of aspirin: Secondary | ICD-10-CM | POA: Diagnosis not present

## 2017-02-09 NOTE — Progress Notes (Signed)
Daily Session Note  Patient Details  Name: Vincent Kennedy MRN: [id number] Date of Birth: October 08, 1932 Referring Provider:     Cardiac Rehab from 01/24/2017 in [facility] Cardiac and Pulmonary Rehab  Referring Provider  Kathlyn Sacramento MD      Encounter Date: 02/09/2017  Check In: Session Check In - 02/09/17 0732      Check-In   Location  [facility]-Cardiac & Pulmonary Rehab    Staff Present  Heath Lark, RN, BSN, CCRP;Ulices Maack Darrin Nipper, [place], ACSM RCEP, Exercise Physiologist    Supervising physician immediately available to respond to emergencies  See telemetry face sheet for immediately available ER MD    Medication changes reported      No    Fall or balance concerns reported     No    Warm-up and Cool-down  Performed on first and last piece of equipment    Resistance Training Performed  Yes    VAD Patient?  No      Pain Assessment   Currently in Pain?  No/denies          Social History   Tobacco Use  Smoking Status Never Smoker  Smokeless Tobacco Never Used    Goals Met:  Independence with exercise equipment Exercise tolerated well No report of cardiac concerns or symptoms Strength training completed today  Goals Unmet:  Not Applicable  Comments: Pt able to follow exercise prescription today without complaint.  Will continue to monitor for progression.   Dr. Emily Filbert is Medical Director for [place] and [name].

## 2017-02-14 ENCOUNTER — Encounter: Payer: Medicare Other | Admitting: *Deleted

## 2017-02-14 DIAGNOSIS — Z7982 Long term (current) use of aspirin: Secondary | ICD-10-CM | POA: Diagnosis not present

## 2017-02-14 DIAGNOSIS — I10 Essential (primary) hypertension: Secondary | ICD-10-CM | POA: Diagnosis not present

## 2017-02-14 DIAGNOSIS — G4730 Sleep apnea, unspecified: Secondary | ICD-10-CM | POA: Diagnosis not present

## 2017-02-14 DIAGNOSIS — Z7951 Long term (current) use of inhaled steroids: Secondary | ICD-10-CM | POA: Diagnosis not present

## 2017-02-14 DIAGNOSIS — I2510 Atherosclerotic heart disease of native coronary artery without angina pectoris: Secondary | ICD-10-CM | POA: Diagnosis not present

## 2017-02-14 DIAGNOSIS — E785 Hyperlipidemia, unspecified: Secondary | ICD-10-CM | POA: Diagnosis not present

## 2017-02-14 DIAGNOSIS — J8410 Pulmonary fibrosis, unspecified: Secondary | ICD-10-CM

## 2017-02-14 DIAGNOSIS — Z955 Presence of coronary angioplasty implant and graft: Secondary | ICD-10-CM | POA: Diagnosis not present

## 2017-02-14 DIAGNOSIS — Z79899 Other long term (current) drug therapy: Secondary | ICD-10-CM | POA: Diagnosis not present

## 2017-02-14 NOTE — Progress Notes (Signed)
Daily Session Note  Patient Details  Name: Vincent Kennedy MRN: [id number] Date of Birth: Jan 18, 1933 Referring Provider:     Cardiac Rehab from 01/24/2017 in [facility] Cardiac and Pulmonary Rehab  Referring Provider  Kathlyn Sacramento MD      Encounter Date: 02/14/2017  Check In: Session Check In - 02/14/17 0747      Check-In   Location  [facility]-Cardiac & Pulmonary Rehab    Staff Present  Earlean Shawl, BS, ACSM CEP, Exercise Physiologist;Joseph Hood RCP,RRT,BSRT;Carroll Enterkin, RN, Levie Heritage, [place], ACSM RCEP, Exercise Physiologist    Supervising physician immediately available to respond to emergencies  See telemetry face sheet for immediately available ER MD    Medication changes reported      No    Fall or balance concerns reported     No    Warm-up and Cool-down  Performed on first and last piece of equipment    Resistance Training Performed  Yes    VAD Patient?  No      Pain Assessment   Currently in Pain?  No/denies    Multiple Pain Sites  No          Social History   Tobacco Use  Smoking Status Never Smoker  Smokeless Tobacco Never Used    Goals Met:  Independence with exercise equipment Exercise tolerated well No report of cardiac concerns or symptoms Strength training completed today  Goals Unmet:  Not Applicable  Comments: Pt able to follow exercise prescription today without complaint.  Will continue to monitor for progression.    Dr. Emily Filbert is Medical Director for [place] and [name].

## 2017-02-15 ENCOUNTER — Ambulatory Visit: Payer: Medicare Other | Admitting: Cardiovascular Disease

## 2017-02-18 DIAGNOSIS — Z955 Presence of coronary angioplasty implant and graft: Secondary | ICD-10-CM

## 2017-02-18 DIAGNOSIS — E785 Hyperlipidemia, unspecified: Secondary | ICD-10-CM | POA: Diagnosis not present

## 2017-02-18 DIAGNOSIS — Z7951 Long term (current) use of inhaled steroids: Secondary | ICD-10-CM | POA: Diagnosis not present

## 2017-02-18 DIAGNOSIS — J9601 Acute respiratory failure with hypoxia: Secondary | ICD-10-CM | POA: Diagnosis not present

## 2017-02-18 DIAGNOSIS — Z79899 Other long term (current) drug therapy: Secondary | ICD-10-CM | POA: Diagnosis not present

## 2017-02-18 DIAGNOSIS — I2510 Atherosclerotic heart disease of native coronary artery without angina pectoris: Secondary | ICD-10-CM | POA: Diagnosis not present

## 2017-02-18 DIAGNOSIS — I10 Essential (primary) hypertension: Secondary | ICD-10-CM | POA: Diagnosis not present

## 2017-02-18 DIAGNOSIS — G4730 Sleep apnea, unspecified: Secondary | ICD-10-CM | POA: Diagnosis not present

## 2017-02-18 DIAGNOSIS — J8410 Pulmonary fibrosis, unspecified: Secondary | ICD-10-CM

## 2017-02-18 DIAGNOSIS — Z7982 Long term (current) use of aspirin: Secondary | ICD-10-CM | POA: Diagnosis not present

## 2017-02-18 NOTE — Progress Notes (Signed)
Daily Session Note  Patient Details  Name: Vincent Kennedy MRN: [id number] Date of Birth: 08/23/1932 Referring Provider:     Cardiac Rehab from 01/24/2017 in [facility] Cardiac and Pulmonary Rehab  Referring Provider  Kathlyn Sacramento MD      Encounter Date: 02/18/2017  Check In: Session Check In - 02/18/17 0839      Check-In   Location  [facility]-Cardiac & Pulmonary Rehab    Staff Present  Nada Maclachlan, BA, ACSM CEP, Exercise Physiologist;Carroll Enterkin, RN, Levie Heritage, MA, ACSM RCEP, Exercise Physiologist;Other [place] physician immediately available to respond to emergencies  See telemetry face sheet for immediately available ER MD    Medication changes reported      No    Fall or balance concerns reported     No    Warm-up and Cool-down  Performed on first and last piece of equipment    Resistance Training Performed  Yes    VAD Patient?  No      Pain Assessment   Currently in Pain?  No/denies    Multiple Pain Sites  No          Social History   Tobacco Use  Smoking Status Never Smoker  Smokeless Tobacco Never Used    Goals Met:  Independence with exercise equipment Exercise tolerated well No report of cardiac concerns or symptoms Strength training completed today  Goals Unmet:  Not Applicable  Comments: Pt able to follow exercise prescription today without complaint.  Will continue to monitor for progression.    Dr. Emily Filbert is Medical Director for [facility] [name].

## 2017-02-21 ENCOUNTER — Ambulatory Visit: Payer: Medicare Other | Admitting: Cardiovascular Disease

## 2017-02-21 ENCOUNTER — Encounter: Payer: Medicare Other | Attending: Cardiovascular Disease | Admitting: *Deleted

## 2017-02-21 DIAGNOSIS — Z7951 Long term (current) use of inhaled steroids: Secondary | ICD-10-CM | POA: Diagnosis not present

## 2017-02-21 DIAGNOSIS — J8410 Pulmonary fibrosis, unspecified: Secondary | ICD-10-CM | POA: Insufficient documentation

## 2017-02-21 DIAGNOSIS — Z79899 Other long term (current) drug therapy: Secondary | ICD-10-CM | POA: Insufficient documentation

## 2017-02-21 DIAGNOSIS — I10 Essential (primary) hypertension: Secondary | ICD-10-CM | POA: Insufficient documentation

## 2017-02-21 DIAGNOSIS — I2510 Atherosclerotic heart disease of native coronary artery without angina pectoris: Secondary | ICD-10-CM | POA: Diagnosis not present

## 2017-02-21 DIAGNOSIS — E785 Hyperlipidemia, unspecified: Secondary | ICD-10-CM | POA: Insufficient documentation

## 2017-02-21 DIAGNOSIS — R0602 Shortness of breath: Secondary | ICD-10-CM | POA: Diagnosis not present

## 2017-02-21 DIAGNOSIS — Z955 Presence of coronary angioplasty implant and graft: Secondary | ICD-10-CM

## 2017-02-21 DIAGNOSIS — J84112 Idiopathic pulmonary fibrosis: Secondary | ICD-10-CM | POA: Diagnosis not present

## 2017-02-21 DIAGNOSIS — Z7982 Long term (current) use of aspirin: Secondary | ICD-10-CM | POA: Insufficient documentation

## 2017-02-21 DIAGNOSIS — G4730 Sleep apnea, unspecified: Secondary | ICD-10-CM | POA: Diagnosis not present

## 2017-02-21 DIAGNOSIS — G4733 Obstructive sleep apnea (adult) (pediatric): Secondary | ICD-10-CM | POA: Diagnosis not present

## 2017-02-21 NOTE — Progress Notes (Signed)
Daily Session Note  Patient Details  Name: Vincent Kennedy MRN: [id number] Date of Birth: 27-Feb-1933 Referring Provider:     Cardiac Rehab from 01/24/2017 in [facility] Cardiac and Pulmonary Rehab  Referring Provider  Kathlyn Sacramento MD      Encounter Date: 02/21/2017  Check In: Session Check In - 02/21/17 0751      Check-In   Location  [facility]-Cardiac & Pulmonary Rehab    Staff Present  Earlean Shawl, BS, ACSM CEP, Exercise Physiologist;Susanne Bice, RN, BSN, CCRP;Laurice Iglesia Luan Pulling, MA, ACSM RCEP, Exercise Physiologist    Supervising physician immediately available to respond to emergencies  See telemetry face sheet for immediately available ER MD    Medication changes reported      No    Fall or balance concerns reported     No    Warm-up and Cool-down  Performed on first and last piece of equipment    Resistance Training Performed  Yes    VAD Patient?  No      Pain Assessment   Currently in Pain?  No/denies          Social History   Tobacco Use  Smoking Status Never Smoker  Smokeless Tobacco Never Used    Goals Met:  Independence with exercise equipment Using PLB without cueing & demonstrates good technique Exercise tolerated well No report of cardiac concerns or symptoms Strength training completed today  Goals Unmet:  Not Applicable  Comments: Pt able to follow exercise prescription today without complaint.  Will continue to monitor for progression.    Dr. Emily Filbert is Medical Director for [place] and [name].

## 2017-02-23 ENCOUNTER — Encounter: Payer: Self-pay | Admitting: *Deleted

## 2017-02-23 DIAGNOSIS — Z7951 Long term (current) use of inhaled steroids: Secondary | ICD-10-CM | POA: Diagnosis not present

## 2017-02-23 DIAGNOSIS — E785 Hyperlipidemia, unspecified: Secondary | ICD-10-CM | POA: Diagnosis not present

## 2017-02-23 DIAGNOSIS — I2510 Atherosclerotic heart disease of native coronary artery without angina pectoris: Secondary | ICD-10-CM | POA: Diagnosis not present

## 2017-02-23 DIAGNOSIS — Z955 Presence of coronary angioplasty implant and graft: Secondary | ICD-10-CM

## 2017-02-23 DIAGNOSIS — Z7982 Long term (current) use of aspirin: Secondary | ICD-10-CM | POA: Diagnosis not present

## 2017-02-23 DIAGNOSIS — G4730 Sleep apnea, unspecified: Secondary | ICD-10-CM | POA: Diagnosis not present

## 2017-02-23 DIAGNOSIS — J8410 Pulmonary fibrosis, unspecified: Secondary | ICD-10-CM | POA: Diagnosis not present

## 2017-02-23 DIAGNOSIS — Z79899 Other long term (current) drug therapy: Secondary | ICD-10-CM | POA: Diagnosis not present

## 2017-02-23 DIAGNOSIS — I10 Essential (primary) hypertension: Secondary | ICD-10-CM | POA: Diagnosis not present

## 2017-02-23 NOTE — Progress Notes (Signed)
Cardiac Individual Treatment Plan  Patient Details  Name: REFUJIO HAYMER MRN: [id number] Date of Birth: 1977/06/13 Referring Provider:     Cardiac Rehab from 01/24/2017 in [facility] Cardiac and Pulmonary Rehab  Referring Provider  Kathlyn Sacramento MD      Initial Encounter Date:    Cardiac Rehab from 01/24/2017 in [facility] Cardiac and Pulmonary Rehab  Date  01/24/17  Referring Provider  Kathlyn Sacramento MD      Visit Diagnosis: Status post coronary artery stent placement  Patient's Home Medications on Admission:  Current Outpatient Medications:  .  aspirin EC 81 MG tablet, Take 1 tablet (81 mg total) by mouth daily., Disp: 30 tablet, Rfl: 6 .  budesonide (PULMICORT) 0.5 MG/2ML nebulizer solution, Take 2 mLs (0.5 mg total) by nebulization 2 (two) times daily., Disp: 2 mL, Rfl: 12 .  carvedilol (COREG) 6.25 MG tablet, Take 1 tablet (6.25 mg total) by mouth 2 (two) times daily., Disp: 60 tablet, Rfl: 6 .  clopidogrel (PLAVIX) 75 MG tablet, Take 1 tablet (75 mg total) by mouth daily., Disp: 30 tablet, Rfl: 6 .  finasteride (PROSCAR) 5 MG tablet, TAKE ONE TABLET BY MOUTH EVERY DAY, Disp: 90 tablet, Rfl: 1 .  mometasone-formoterol (DULERA) 100-5 MCG/ACT AERO, Inhale 2 puffs into the lungs 2 (two) times daily., Disp: 1 Inhaler, Rfl: 1 .  nitroGLYCERIN (NITROSTAT) 0.4 MG SL tablet, Place 1 tablet (0.4 mg total) under the tongue every 5 (five) minutes as needed for chest pain., Disp: 25 tablet, Rfl: 12 .  pantoprazole (PROTONIX) 40 MG tablet, Take 1 tablet (40 mg total) by mouth daily., Disp: 30 tablet, Rfl: 6 .  Pirfenidone (ESBRIET) 267 MG CAPS, Take 801 mg by mouth 3 (three) times daily. , Disp: , Rfl:  .  rivastigmine (EXELON) 9.5 mg/24hr, Place 1 patch (9.5 mg total) onto the skin daily. (Patient taking differently: Place 9.5 mg onto the skin once a week. ), Disp: 30 patch, Rfl: 2 .  rosuvastatin (CRESTOR) 40 MG tablet, TAKE ONE TABLET EVERY DAY (Patient taking differently: Take 40 mg by mouth  every day), Disp: 90 tablet, Rfl: 2 .  zolpidem (AMBIEN) 5 MG tablet, Take 5 mg by mouth at bedtime as needed for sleep. , Disp: , Rfl:   Past Medical History: Past Medical History:  Diagnosis Date  . Abnormal nuclear cardiac imaging test    Dr. Fletcher Anon  . BPH (benign prostatic hyperplasia)   . Coronary artery disease    Cardiac cath September 2008: 20% left main stenosis, 40% mid LAD stenosis, 99% ostial first diagonal stenosis in a large branch and 90% ostial disease in second diagonal but the vessel was very small, 20% mid RCA stenosis with normal ejection fraction. Successful angioplasty and drug-eluting stent placement to the ostial first diagonal with a 2.5 x 15 mm Xience stent  . GERD (gastroesophageal reflux disease)   . Hyperlipidemia   . Hypertension    BP has never been high(per pt).  Metoprolol is to keep BP low because of stent (afterload)  . On home oxygen therapy    "2-3L; 24/7" (01/19/2017)  . OSA on CPAP   . Oxygen deficiency   . Pneumonia 07/2016  . Pulmonary fibrosis ([place]) 08/26/2015  . Shortness of breath dyspnea    with exertion  . Wears hearing aid    (sometimes)    Tobacco Use: Social History   Tobacco Use  Smoking Status Never Smoker  Smokeless Tobacco Never Used    Labs: Recent Review Flowsheet  Data    Labs for ITP Cardiac and Pulmonary Rehab Latest Ref Rng & Units 01/12/2017 01/12/2017 01/12/2017 01/12/2017 01/12/2017   Cholestrol 125 - 200 mg/dL - - - - -   LDLCALC <130 mg/dL - - - - -   HDL >=40 mg/dL - - - - -   Trlycerides <150 mg/dL - - - - -   PHART 7.350 - 7.450 - 7.378 - - -   PCO2ART 32.0 - 48.0 mmHg - 41.3 - - -   HCO3 20.0 - 28.0 mmol/L 23.9 24.3 23.8 24.1 25.3   TCO2 22 - 32 mmol/L _0 ACIDBASEDEF 0.0 - 2.0 mmol/L 2.0 1.0 2.0 1.0 1.0   O2SAT % 78.0 98.0 77.0 76.0 64.0       Exercise Target Goals:    Exercise Program Goal: Individual exercise prescription set with THRR, safety & activity barriers. Participant  demonstrates ability to understand and report RPE using BORG scale, to self-measure pulse accurately, and to acknowledge the importance of the exercise prescription.  Exercise Prescription Goal: Starting with aerobic activity 30 plus minutes a day, 3 days per week for initial exercise prescription. Provide home exercise prescription and guidelines that participant acknowledges understanding prior to discharge.  Activity Barriers & Risk Stratification: Activity Barriers & Cardiac Risk Stratification - 01/24/17 1341      Activity Barriers & Cardiac Risk Stratification   Activity Barriers  Deconditioning;Shortness of Breath    Cardiac Risk Stratification  High       6 Minute Walk: 6 Minute Walk    Row Name 01/24/17 1305         6 Minute Walk   Phase  Initial     Distance  250 feet     Walk Time  1.63 minutes     # of Rest Breaks  1 test stopped at 1:38 for desaturation     MPH  1.74     METS  0.94     RPE  13     Perceived Dyspnea   2     VO2 Peak  3.29     Symptoms  Yes (comment) wearing concentrator on back     Comments  SOB     Resting HR  77 bpm     Resting BP  148/84     Resting Oxygen Saturation   97 %     Exercise Oxygen Saturation  during 6 min walk  73 %     Max Ex. HR  122 bpm     Max Ex. BP  152/64     2 Minute Post BP  144/70       Interval HR   1 Minute HR  80     2 Minute HR  122 test stopped at 1:38     4 Minute HR  66     Interval Heart Rate?  Yes       Interval Oxygen   Interval Oxygen?  Yes     Baseline Oxygen Saturation %  97 %     1 Minute Oxygen Saturation %  80 %     1 Minute Liters of Oxygen  4 L pulsed     2 Minute Oxygen Saturation %  73 % test stopped at 1:38 desaturation at 75%     2 Minute Liters of Oxygen  4 L     3 Minute Oxygen Saturation %  90 %     3 Minute Liters  of Oxygen  4 L     4 Minute Oxygen Saturation %  94 %     4 Minute Liters of Oxygen  4 L        Oxygen Initial Assessment: Oxygen Initial Assessment - 01/24/17 1304       Home Oxygen   Home Oxygen Device  Portable Concentrator;Home Concentrator;E-Tanks    Sleep Oxygen Prescription  CPAP    Liters per minute  2.5    Home Exercise Oxygen Prescription  Continuous    Liters per minute  4    Home at Rest Exercise Oxygen Prescription  Continuous    Liters per minute  3    Compliance with Home Oxygen Use  Yes      Initial 6 min Walk   Oxygen Used  Portable Concentrator;Pulsed    Liters per minute  4      Program Oxygen Prescription   Program Oxygen Prescription  Continuous    Liters per minute  4      Intervention   Short Term Goals  To learn and exhibit compliance with exercise, home and travel O2 prescription;To learn and understand importance of maintaining oxygen saturations>88%;To learn and understand importance of monitoring SPO2 with pulse oximeter and demonstrate accurate use of the pulse oximeter.;To learn and demonstrate proper pursed lip breathing techniques or other breathing techniques.    Long  Term Goals  Exhibits compliance with exercise, home and travel O2 prescription;Verbalizes importance of monitoring SPO2 with pulse oximeter and return demonstration;Maintenance of O2 saturations>88%;Exhibits proper breathing techniques, such as pursed lip breathing or other method taught during program session;Compliance with respiratory medication       Oxygen Re-Evaluation: Oxygen Re-Evaluation    Row Name 02/14/17 0905             Program Oxygen Prescription   Program Oxygen Prescription  Continuous       Liters per minute  4       Comments  6 Liters on the treadmill         Home Oxygen   Home Oxygen Device  Portable Concentrator;Home Concentrator;E-Tanks       Sleep Oxygen Prescription  CPAP       Liters per minute  2.5       Home Exercise Oxygen Prescription  Continuous       Liters per minute  4       Home at Rest Exercise Oxygen Prescription  Continuous       Liters per minute  3       Compliance with Home Oxygen Use  Yes          Goals/Expected Outcomes   Short Term Goals  To learn and demonstrate proper use of respiratory medications;To learn and understand importance of maintaining oxygen saturations>88%;To learn and exhibit compliance with exercise, home and travel O2 prescription;To learn and understand importance of monitoring SPO2 with pulse oximeter and demonstrate accurate use of the pulse oximeter.;To learn and demonstrate proper pursed lip breathing techniques or other breathing techniques.       Long  Term Goals  Compliance with respiratory medication;Demonstrates proper use of MDI's;Exhibits proper breathing techniques, such as pursed lip breathing or other method taught during program session;Maintenance of O2 saturations>88%;Verbalizes importance of monitoring SPO2 with pulse oximeter and return demonstration;Exhibits compliance with exercise, home and travel O2 prescription       Comments  Amjad uses his home oxygen as prescribed. He states he uses a CPAP everynight but it  has been getting too hot he says. Informed him that if the water is evaporating before he awakes the machine will get hot. Also told him that the humidifier setting is one of the settings that he can change himself. He states someone comes to his house every so often to check on his machine. Informed him to talk to his home care provider to make sure that his machine does not get too hot. Patient verbalizes understanding. He takes his nebulizer twice a day and uses Dulera as prescribed.       Goals/Expected Outcomes  Short: make sure home CPAP is used correctly. Long: uses CPAP independently.          Oxygen Discharge (Final Oxygen Re-Evaluation): Oxygen Re-Evaluation - 02/14/17 0905      Program Oxygen Prescription   Program Oxygen Prescription  Continuous    Liters per minute  4    Comments  6 Liters on the treadmill      Home Oxygen   Home Oxygen Device  Portable Concentrator;Home Concentrator;E-Tanks    Sleep Oxygen Prescription   CPAP    Liters per minute  2.5    Home Exercise Oxygen Prescription  Continuous    Liters per minute  4    Home at Rest Exercise Oxygen Prescription  Continuous    Liters per minute  3    Compliance with Home Oxygen Use  Yes      Goals/Expected Outcomes   Short Term Goals  To learn and demonstrate proper use of respiratory medications;To learn and understand importance of maintaining oxygen saturations>88%;To learn and exhibit compliance with exercise, home and travel O2 prescription;To learn and understand importance of monitoring SPO2 with pulse oximeter and demonstrate accurate use of the pulse oximeter.;To learn and demonstrate proper pursed lip breathing techniques or other breathing techniques.    Long  Term Goals  Compliance with respiratory medication;Demonstrates proper use of MDI's;Exhibits proper breathing techniques, such as pursed lip breathing or other method taught during program session;Maintenance of O2 saturations>88%;Verbalizes importance of monitoring SPO2 with pulse oximeter and return demonstration;Exhibits compliance with exercise, home and travel O2 prescription    Comments  Amjad uses his home oxygen as prescribed. He states he uses a CPAP everynight but it has been getting too hot he says. Informed him that if the water is evaporating before he awakes the machine will get hot. Also told him that the humidifier setting is one of the settings that he can change himself. He states someone comes to his house every so often to check on his machine. Informed him to talk to his home care provider to make sure that his machine does not get too hot. Patient verbalizes understanding. He takes his nebulizer twice a day and uses Dulera as prescribed.    Goals/Expected Outcomes  Short: make sure home CPAP is used correctly. Long: uses CPAP independently.       Initial Exercise Prescription: Initial Exercise Prescription - 01/24/17 1400      Date of Initial Exercise RX and Referring  Provider   Date  01/24/17    Referring Provider  Kathlyn Sacramento MD      Oxygen   Oxygen  Continuous    Liters  4      Treadmill   MPH  1.5    Grade  0    Minutes  15    METs  2.15      NuStep   Level  1    SPM  80  Minutes  15    METs  1.7      Arm Ergometer   Level  1    RPM  25    Minutes  15    METs  1.7      Prescription Details   Frequency (times per week)  3    Duration  Progress to 45 minutes of aerobic exercise without signs/symptoms of physical distress      Intensity   THRR 40-80% of Max Heartrate  101-126    Ratings of Perceived Exertion  11-13    Perceived Dyspnea  0-4      Progression   Progression  Continue to progress workloads to maintain intensity without signs/symptoms of physical distress.      Resistance Training   Training Prescription  Yes    Weight  3 lbs    Reps  10-15       Perform Capillary Blood Glucose checks as needed.  Exercise Prescription Changes: Exercise Prescription Changes    Row Name 12/28/16 1600 01/12/17 1500 01/24/17 1400 01/28/17 0700 02/01/17 1300     Response to Exercise   Blood Pressure (Admit)  128/54  124/64  148/84  -  134/72   Blood Pressure (Exercise)  130/80  -  152/64  -  134/72   Blood Pressure (Exit)  122/64  124/64  144/70  -  124/50   Heart Rate (Admit)  74 bpm  71 bpm  77 bpm  -  63 bpm   Heart Rate (Exercise)  92 bpm  94 bpm  122 bpm  -  103 bpm   Heart Rate (Exit)  72 bpm  63 bpm  66 bpm  -  60 bpm   Oxygen Saturation (Admit)  96 %  90 %  97 %  -  -   Oxygen Saturation (Exercise)  87 %  87 %  73 %  -  84 %   Oxygen Saturation (Exit)  99 %  98 %  90 %  -  -   Rating of Perceived Exertion (Exercise)  _0 -  12   Perceived Dyspnea (Exercise)  1  1.5  2  -  2   Symptoms  none  none  none  -  none   Comments  second full day of exercise  -  walk test results  -  -   Duration  Progress to 45 minutes of aerobic exercise without signs/symptoms of physical distress  Progress to 45 minutes of  aerobic exercise without signs/symptoms of physical distress  -  -  Continue with 45 min of aerobic exercise without signs/symptoms of physical distress.   Intensity  THRR unchanged  THRR unchanged  -  -  THRR unchanged     Progression   Progression  Continue to progress workloads to maintain intensity without signs/symptoms of physical distress.  Continue to progress workloads to maintain intensity without signs/symptoms of physical distress.  -  -  Continue to progress workloads to maintain intensity without signs/symptoms of physical distress.   Average METs  2.25  2.42  -  -  2.32     Resistance Training   Training Prescription  Yes  Yes  -  -  Yes   Weight  3 lbs  3 lbs  -  -  3 lbs   Reps  10-15  10-15  -  -  10-15     Interval Training   Interval Training  No  No  -  -  No     Oxygen   Oxygen  Continuous  Continuous  -  -  Continuous   Liters  3  3  -  -  6     Treadmill   MPH  1.5  2  -  -  1.5   Grade  0.5  0.5  -  -  0   Minutes  1  15  -  -  15   METs  2.25  2.67  -  -  2.15     NuStep   Level  2  4  -  -  4   SPM  100  114  -  -  -   Minutes  15  15  -  -  15   METs  1.9  2.3  -  -  2.9     Arm Ergometer   Level  -  -  -  -  1   Minutes  -  -  -  -  15   METs  -  -  -  -  1.9     REL-XR   Level  1  1  -  -  -   Speed  44  46  -  -  -   Minutes  15  15  -  -  -   METs  2.6  2.3  -  -  -     Home Exercise Plan   Plans to continue exercise at  -  -  -  Home (comment) walking on treadmill  Home (comment) walking on treadmill   Frequency  -  -  -  Add 1 additional day to program exercise sessions.  Add 1 additional day to program exercise sessions.   Initial Home Exercises Provided  -  -  -  01/28/17  01/28/17   Row Name 02/16/17 1500             Response to Exercise   Blood Pressure (Admit)  132/70       Blood Pressure (Exercise)  130/82       Blood Pressure (Exit)  108/66       Heart Rate (Admit)  67 bpm       Heart Rate (Exercise)  109 bpm        Heart Rate (Exit)  65 bpm       Oxygen Saturation (Exercise)  93 %       Rating of Perceived Exertion (Exercise)  11       Symptoms  none       Duration  Continue with 45 min of aerobic exercise without signs/symptoms of physical distress.       Intensity  THRR unchanged         Progression   Progression  Continue to progress workloads to maintain intensity without signs/symptoms of physical distress.       Average METs  2.25         Resistance Training   Training Prescription  Yes       Weight  3 lbs       Reps  10-15         Interval Training   Interval Training  No         Oxygen   Oxygen  Continuous       Liters  4-6         Treadmill  MPH  1.5       Grade  0       Minutes  15       METs  2.15         NuStep   Level  4       Minutes  15       METs  2.6         Arm Ergometer   Level  1       Minutes  15       METs  2         Home Exercise Plan   Plans to continue exercise at  Home (comment) walking on treadmill       Frequency  Add 1 additional day to program exercise sessions.       Initial Home Exercises Provided  01/28/17          Exercise Comments: Exercise Comments    Row Name 01/26/17 0734           Exercise Comments  First full day of exercise!  Patient was oriented to gym and equipment including functions, settings, policies, and procedures.  Patient's individual exercise prescription and treatment plan were reviewed.  All starting workloads were established based on the results of the 6 minute walk test done at initial orientation visit.  The plan for exercise progression was also introduced and progression will be customized based on patient's performance and goals.          Exercise Goals and Review: Exercise Goals    Row Name 01/24/17 1402             Exercise Goals   Increase Physical Activity  Yes       Intervention  Provide advice, education, support and counseling about physical activity/exercise needs.;Develop an individualized  exercise prescription for aerobic and resistive training based on initial evaluation findings, risk stratification, comorbidities and participant's personal goals.       Expected Outcomes  Achievement of increased cardiorespiratory fitness and enhanced flexibility, muscular endurance and strength shown through measurements of functional capacity and personal statement of participant.       Increase Strength and Stamina  Yes       Intervention  Provide advice, education, support and counseling about physical activity/exercise needs.;Develop an individualized exercise prescription for aerobic and resistive training based on initial evaluation findings, risk stratification, comorbidities and participant's personal goals.       Expected Outcomes  Achievement of increased cardiorespiratory fitness and enhanced flexibility, muscular endurance and strength shown through measurements of functional capacity and personal statement of participant.       Able to understand and use rate of perceived exertion (RPE) scale  Yes       Intervention  Provide education and explanation on how to use RPE scale       Expected Outcomes  Short Term: Able to use RPE daily in rehab to express subjective intensity level;Long Term:  Able to use RPE to guide intensity level when exercising independently       Able to understand and use Dyspnea scale  Yes       Intervention  Provide education and explanation on how to use Dyspnea scale       Expected Outcomes  Short Term: Able to use Dyspnea scale daily in rehab to express subjective sense of shortness of breath during exertion;Long Term: Able to use Dyspnea scale to guide intensity level when exercising independently  Knowledge and understanding of Target Heart Rate Range (THRR)  Yes       Intervention  Provide education and explanation of THRR including how the numbers were predicted and where they are located for reference       Expected Outcomes  Short Term: Able to  state/look up THRR;Long Term: Able to use THRR to govern intensity when exercising independently;Short Term: Able to use daily as guideline for intensity in rehab       Able to check pulse independently  Yes       Intervention  Provide education and demonstration on how to check pulse in carotid and radial arteries.;Review the importance of being able to check your own pulse for safety during independent exercise       Expected Outcomes  Long Term: Able to check pulse independently and accurately;Short Term: Able to explain why pulse checking is important during independent exercise       Understanding of Exercise Prescription  Yes       Intervention  Provide education, explanation, and written materials on patient's individual exercise prescription       Expected Outcomes  Short Term: Able to explain program exercise prescription;Long Term: Able to explain home exercise prescription to exercise independently          Exercise Goals Re-Evaluation : Exercise Goals Re-Evaluation    Row Name 12/28/16 1611 01/12/17 1534 01/26/17 0734 01/28/17 0757 02/01/17 1324     Exercise Goal Re-Evaluation   Exercise Goals Review  Increase Strength and Stamina;Increase Physical Activity  Increase Strength and Stamina;Increase Physical Activity  Understanding of Exercise Prescription;Knowledge and understanding of Target Heart Rate Range (THRR);Able to understand and use rate of perceived exertion (RPE) scale  Increase Physical Activity;Able to understand and use Dyspnea scale;Understanding of Exercise Prescription;Increase Strength and Stamina;Knowledge and understanding of Target Heart Rate Range (THRR);Able to understand and use rate of perceived exertion (RPE) scale;Able to check pulse independently  Increase Physical Activity;Increase Strength and Stamina;Understanding of Exercise Prescription   Comments  Amjad has completed two full days of exercise in rehab.  He is off to a good start.  We will continue to  monitor his progression.   Amjad has continued to do well in rehab.  He has been averaging about two days a week.  He would get more out of it if his attendance was better.  He is up to level 4 on the NuStep.  We will continue to monitor his progress.  Reviewed RPE scale, THR and program prescription with pt today.  Pt voiced understanding and was given a copy of goals to take home.   Reviewed home exercise with pt today.  Pt plans to walk on treadmill at home for exercise.  Reviewed THR, pulse, RPE, sign and symptoms, NTG use, and when to call 911 or MD.  Also discussed weather considerations and indoor options.  Pt voiced understanding.  Amjad has made a smooth transition from Pulmonary to Cardiac Rehab.  He continues to desaturate on the treadmill, but we did get clearance to increase his oxygen to 6L on the treadmill and for exercise at home.  We will continue to monitor his progression.    Expected Outcomes  Short: Attend rehab classes regularly.  Long: Complete rehab in full this time.   Short: Attend class more regularly.  Long: Exercise at home independently.   Short: Use RPE daily to regulate intensity.  Long: Follow program prescription in THR.  Short: Add in one to two  days of walking on treadmill at home.  Long: Make exercise part of regular routine  Short: Try 6L of oxygen on the treadmill and monitor saturations.  Long: Continue to try to exercise more.    Davis Name 02/16/17 1510             Exercise Goal Re-Evaluation   Exercise Goals Review  Increase Physical Activity;Increase Strength and Stamina;Understanding of Exercise Prescription       Comments  Amjad has continud to do well in Cardiac Rehab.  He has been doing better on the treadmill at 6L versus 4L.  He is now up to 2.6 METs on the NuStep. We will continue to monitor his progression.        Expected Outcomes  Short: Continue to work on increasing workloads.  Long: Try to increase physical activity overall.           Discharge  Exercise Prescription (Final Exercise Prescription Changes): Exercise Prescription Changes - 02/16/17 1500      Response to Exercise   Blood Pressure (Admit)  132/70    Blood Pressure (Exercise)  130/82    Blood Pressure (Exit)  108/66    Heart Rate (Admit)  67 bpm    Heart Rate (Exercise)  109 bpm    Heart Rate (Exit)  65 bpm    Oxygen Saturation (Exercise)  93 %    Rating of Perceived Exertion (Exercise)  11    Symptoms  none    Duration  Continue with 45 min of aerobic exercise without signs/symptoms of physical distress.    Intensity  THRR unchanged      Progression   Progression  Continue to progress workloads to maintain intensity without signs/symptoms of physical distress.    Average METs  2.25      Resistance Training   Training Prescription  Yes    Weight  3 lbs    Reps  10-15      Interval Training   Interval Training  No      Oxygen   Oxygen  Continuous    Liters  4-6      Treadmill   MPH  1.5    Grade  0    Minutes  15    METs  2.15      NuStep   Level  4    Minutes  15    METs  2.6      Arm Ergometer   Level  1    Minutes  15    METs  2      Home Exercise Plan   Plans to continue exercise at  Home (comment) walking on treadmill    Frequency  Add 1 additional day to program exercise sessions.    Initial Home Exercises Provided  01/28/17       Nutrition:  Target Goals: Understanding of nutrition guidelines, daily intake of sodium <1564m, cholesterol <2019m calories 30% from fat and 7% or less from saturated fats, daily to have 5 or more servings of fruits and vegetables.  Biometrics: Pre Biometrics - 01/24/17 1403      Pre Biometrics   Height  5' 6.1" (1.679 m)    Weight  155 lb 9.6 oz (70.6 kg)    Waist Circumference  37.5 inches    Hip Circumference  36 inches    Waist to Hip Ratio  1.04 %    BMI (Calculated)  25.04    Single Leg Stand  18.09 seconds  Nutrition Therapy Plan and Nutrition Goals: Nutrition Therapy & Goals -  01/24/17 1337      Intervention Plan   Intervention  Prescribe, educate and counsel regarding individualized specific dietary modifications aiming towards targeted core components such as weight, hypertension, lipid management, diabetes, heart failure and other comorbidities.;Nutrition handout(s) given to patient.    Expected Outcomes  Short Term Goal: Understand basic principles of dietary content, such as calories, fat, sodium, cholesterol and nutrients.;Short Term Goal: A plan has been developed with personal nutrition goals set during dietitian appointment.;Long Term Goal: Adherence to prescribed nutrition plan.       Nutrition Discharge: Rate Your Plate Scores: Nutrition Assessments - 01/24/17 1338      MEDFICTS Scores   Pre Score  -- will bring in to first session       Nutrition Goals Re-Evaluation: Nutrition Goals Re-Evaluation    Arion Name 02/14/17 0914             Goals   Current Weight  155 lb 11.2 oz (70.6 kg)       Nutrition Goal  Maintain weight and intake more calories to improve strength.       Comment  Amjad shows an interest in meeting with the dietician. Dietician Lattie Haw spoke to him about meeting next week. He states he is not as hungry as he used to be. He says he eats 2 meals a day. At dinner he eats alot of fruit. He does not get short of breath while eating.       Expected Outcome  Short: meet with the dietician. Long: Adhere to a diet plan to increase calorie intake.          Nutrition Goals Discharge (Final Nutrition Goals Re-Evaluation): Nutrition Goals Re-Evaluation - 02/14/17 0914      Goals   Current Weight  155 lb 11.2 oz (70.6 kg)    Nutrition Goal  Maintain weight and intake more calories to improve strength.    Comment  Amjad shows an interest in meeting with the dietician. Dietician Lattie Haw spoke to him about meeting next week. He states he is not as hungry as he used to be. He says he eats 2 meals a day. At dinner he eats alot of fruit. He does not  get short of breath while eating.    Expected Outcome  Short: meet with the dietician. Long: Adhere to a diet plan to increase calorie intake.       Psychosocial: Target Goals: Acknowledge presence or absence of significant depression and/or stress, maximize coping skills, provide positive support system. Participant is able to verbalize types and ability to use techniques and skills needed for reducing stress and depression.   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 He feels he can not do simple things without being SOB or tired.       Family Dynamics   Good Support System?  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 His wife was with him today at his appointment and is supportive of him beginning the cardiac rehab  program.       Barriers   Psychosocial barriers to participate in program  The patient should benefit from training in stress management and relaxation.      Screening Interventions   Interventions  Yes;Encouraged to exercise;Program counselor consult;To provide support and resources with identified psychosocial needs;Provide feedback about the scores to participant    Expected Outcomes  Short Term goal: Utilizing psychosocial counselor, staff and physician to assist with identification of specific Stressors or current issues interfering with healing process. Setting desired goal for each stressor or current issue identified.;Long Term Goal: Stressors or current issues are controlled or eliminated.;Short Term goal: Identification and review with participant of any Quality  of Life or Depression concerns found by scoring the questionnaire.;Long Term goal: The participant improves quality of Life and PHQ9 Scores as seen by post scores and/or verbalization of changes       Quality of Life Scores:  Quality of Life - 01/24/17 1340      Quality of Life Scores   Health/Function Pre  17.11 %    Socioeconomic Pre  26 %    Psych/Spiritual Pre  18.57 %    Family Pre  29 %    GLOBAL Pre  21.11 %       PHQ-9: Recent Review Flowsheet Data    Depression screen [facility] 2/9 01/26/2017 01/24/2017 12/20/2016 04/26/2016 12/24/2015   Decreased Interest _0 0 0   Down, Depressed, Hopeless _1 0 0   PHQ - 2 Score _2 0 0   Altered sleeping 0 3 0 - -   Tired, decreased energy _3 - -   Change in appetite _4 - -   Feeling bad or failure about yourself  0 3 1 - -   Trouble concentrating 0 0 0 - -   Moving slowly or fidgety/restless 0 0 0 - -   Suicidal thoughts 0 0 0 - -   PHQ-9 Score _5 - -   Difficult doing work/chores Somewhat difficult Somewhat difficult Somewhat difficult - -     Interpretation of Total Score  Total Score Depression Severity:  1-4 = Minimal depression, 5-9 = Mild depression, 10-14 = Moderate depression, 15-19 = Moderately severe depression, 20-27 = Severe depression   Psychosocial Evaluation and Intervention: Psychosocial Evaluation - 01/26/17 0958      Psychosocial Evaluation & Interventions   Interventions  Encouraged to exercise with the program and follow exercise prescription    Comments  Counselor met with Mr. Rogue Jury today for initial psychosocial evaluation.  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  He has a strong support system with a spouse of 34 years and close friends.   He is also actively involved in his local faith community.  Dr. B reports that he sleeps well typically and has a good appetite.  He is generally in a positive mood and denies a history or current  symptoms of depression or anxiety.  His PHQ-9 score was a "12" indicating moderate depression.  However, this counselor reviewed the individual items with him reporting "misunderstanding the values of the questions."  His revised score is a "5" indicating some mild symptoms are present - mostly relating to his energy level and limitations.  Dr. B states that his health is his primary stressor currently.  He has goals to get off of his Oxygen as much and increase  his stamina and strength while in this program.  He plans to purchase some home equipment to help exercise more consistently.  Counselor will continue to follow with Mr. Rogue Jury.     Expected Outcomes  Dr. B will benefit from consistent exercise to achieve his stated goals.  The educational and psychoeducational components will be helpful in understanding and coping with his health condition.      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 He says other than his rental properties his stress levels are low. His wife is very supportive of his exercise program.       Expected Outcomes  Short: continue to attend [facility]. Long: Maintain exercise to decrease stress.       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 He says other than his rental properties his stress levels are low. His wife is very  supportive of his exercise program.    Expected Outcomes  Short: continue to attend [facility]. Long: Maintain exercise to decrease stress.    Interventions  Encouraged to attend Cardiac Rehabilitation for the exercise    Continue Psychosocial Services   Follow up required by staff       Vocational Rehabilitation: Provide vocational rehab assistance to qualifying candidates.   Vocational Rehab Evaluation & Intervention: Vocational Rehab - 01/24/17 1303      Initial Vocational Rehab Evaluation & Intervention   Assessment shows need for Vocational Rehabilitation  No       Education: Education Goals: Education classes will be provided on a variety of topics geared toward better understanding of heart health and risk factor modification. Participant will state understanding/return demonstration of topics presented as noted by education test scores.  Learning Barriers/Preferences: Learning Barriers/Preferences - 01/24/17 1302      Learning Barriers/Preferences   Learning Barriers  Hearing has hearing aid but does not wear them often    Learning Preferences  Audio;Written Material       Education Topics: General Nutrition Guidelines/Fats and Fiber: -Group instruction provided by verbal, written material, models and posters to present the general guidelines for heart healthy nutrition. Gives an explanation and review of dietary fats and fiber.   Pulmonary Rehab from 01/10/2017 in [facility] Cardiac and Pulmonary Rehab  Date  01/03/17  Educator  CR  Instruction Review Code  1- Verbalizes Understanding      Controlling Sodium/Reading Food Labels: -Group verbal and written material supporting the discussion of sodium use in heart healthy nutrition. Review and explanation with models, verbal and written materials for utilization of the food label.   Pulmonary Rehab from 01/10/2017 in [facility] Cardiac and Pulmonary Rehab  Date  01/10/17  Educator  CR  Instruction Review Code  1- Verbalizes  Understanding  Exercise Physiology & Risk Factors: - Group verbal and written instruction with models to review the exercise physiology of the cardiovascular system and associated critical values. Details cardiovascular disease risk factors and the goals associated with each risk factor.   Cardiac Rehab from 02/21/2017 in [facility] Cardiac and Pulmonary Rehab  Date  01/26/17  Educator  [facility]  Instruction Review Code  1- Verbalizes Understanding      Aerobic Exercise & Resistance Training: - Gives group verbal and written discussion on the health impact of inactivity. On the components of aerobic and resistive training programs and the benefits of this training and how to safely progress through these programs.   Flexibility, Balance, General Exercise Guidelines: - Provides group verbal and written instruction on the benefits of flexibility and balance training programs. Provides general exercise guidelines with specific guidelines to those with heart or lung disease. Demonstration and skill practice provided.   Pulmonary Rehab from 07/28/2015 in [facility] Cardiac and Pulmonary Rehab  Date  05/07/15  Educator  RM  Instruction Review Code (retired)  2- meets goals/outcomes      Stress Management: - Provides group verbal and written instruction about the health risks of elevated stress, cause of high stress, and healthy ways to reduce stress.   Cardiac Rehab from 02/21/2017 in [facility] Cardiac and Pulmonary Rehab  Date  02/02/17  Educator  [facility]  Instruction Review Code  5- Refused Teaching      Depression: - Provides group verbal and written instruction on the correlation between heart/lung disease and depressed mood, treatment options, and the stigmas associated with seeking treatment.   Anatomy & Physiology of the Heart: - Group verbal and written instruction and models provide basic cardiac anatomy and physiology, with the coronary electrical and arterial systems. Review of: AMI, Angina, Valve  disease, Heart Failure, Cardiac Arrhythmia, Pacemakers, and the ICD.   Cardiac Rehab from 02/21/2017 in [facility] Cardiac and Pulmonary Rehab  Date  01/31/17  Educator  CE  Instruction Review Code  1- Verbalizes Understanding      Cardiac Procedures: - Group verbal and written instruction to review commonly prescribed medications for heart disease. Reviews the medication, class of the drug, and side effects. Includes the steps to properly store meds and maintain the prescription regimen. (beta blockers and nitrates)   Cardiac Medications I: - Group verbal and written instruction to review commonly prescribed medications for heart disease. Reviews the medication, class of the drug, and side effects. Includes the steps to properly store meds and maintain the prescription regimen.   Cardiac Rehab from 02/21/2017 in [facility] Cardiac and Pulmonary Rehab  Date  02/14/17  Educator  CE  Instruction Review Code  1- Verbalizes Understanding      Cardiac Medications II: -Group verbal and written instruction to review commonly prescribed medications for heart disease. Reviews the medication, class of the drug, and side effects. (all other drug classes)   Pulmonary Rehab from 01/10/2017 in [facility] Cardiac and Pulmonary Rehab  Date  12/31/16  Educator  [facility]  Instruction Review Code  1- Verbalizes Understanding       Go Sex-Intimacy & Heart Disease, Get SMART - Goal Setting: - Group verbal and written instruction through game format to discuss heart disease and the return to sexual intimacy. Provides group verbal and written material to discuss and apply goal setting through the application of the S.M.A.R.T. Method.   Other Matters of the Heart: - Provides group verbal, written materials and models to describe Heart Failure, Angina, Valve  Disease, Peripheral Artery Disease, and Diabetes in the realm of heart disease. Includes description of the disease process and treatment options available to the cardiac  patient.   Cardiac Rehab from 02/21/2017 in [facility] Cardiac and Pulmonary Rehab  Date  01/31/17  Educator  CE  Instruction Review Code  1- Verbalizes Understanding      Exercise & Equipment Safety: - Individual verbal instruction and demonstration of equipment use and safety with use of the equipment.   Cardiac Rehab from 02/21/2017 in [facility] Cardiac and Pulmonary Rehab  Date  01/24/17  Educator  [place]  Instruction Review Code  1- Verbalizes Understanding      Infection Prevention: - Provides verbal and written material to individual with discussion of infection control including proper hand washing and proper equipment cleaning during exercise session.   Cardiac Rehab from 02/21/2017 in [facility] Cardiac and Pulmonary Rehab  Date  01/24/17  Educator  [place]  Instruction Review Code  1- Verbalizes Understanding      Falls Prevention: - Provides verbal and written material to individual with discussion of falls prevention and safety.   Cardiac Rehab from 02/21/2017 in [facility] Cardiac and Pulmonary Rehab  Date  01/24/17  Educator  [place]  Instruction Review Code  1- Verbalizes Understanding      Diabetes: - Individual verbal and written instruction to review signs/symptoms of diabetes, desired ranges of glucose level fasting, after meals and with exercise. Acknowledge that pre and post exercise glucose checks will be done for 3 sessions at entry of program.   Other: -Provides group and verbal instruction on various topics (see comments)    Knowledge Questionnaire Score: Knowledge Questionnaire Score - 01/24/17 1353      Knowledge Questionnaire Score   Pre Score  24/28 Reviewed correct answeres with patient who verbalied understanding.       Core Components/Risk Factors/Patient Goals at Admission: Personal Goals and Risk Factors at Admission - 01/24/17 1335      Core Components/Risk Factors/Patient Goals on Admission    Weight Management  Yes;Weight Maintenance    Intervention  Weight  Management: Develop a combined nutrition and exercise program designed to reach desired caloric intake, while maintaining appropriate intake of nutrient and fiber, sodium and fats, and appropriate energy expenditure required for the weight goal.;Weight Management: Provide education and appropriate resources to help participant work on and attain dietary goals.;Weight Management/Obesity: Establish reasonable short term and long term weight goals.    Admit Weight  155 lb 9.6 oz (70.6 kg)    Goal Weight: Short Term  150 lb (68 kg)    Goal Weight: Long Term  150 lb (68 kg)    Expected Outcomes  Weight Maintenance: Understanding of the daily nutrition guidelines, which includes 25-35% calories from fat, 7% or less cal from saturated fats, less than 247m cholesterol, less than 1.5gm of sodium, & 5 or more servings of fruits and vegetables daily;Long Term: Adherence to nutrition and physical activity/exercise program aimed toward attainment of established weight goal;Short Term: Continue to assess and modify interventions until short term weight is achieved;Understanding recommendations for meals to include 15-35% energy as protein, 25-35% energy from fat, 35-60% energy from carbohydrates, less than 2017mof dietary cholesterol, 20-35 gm of total fiber daily;Understanding of distribution of calorie intake throughout the day with the consumption of 4-5 meals/snacks    Improve shortness of breath with ADL's  Yes Patient would like to be able to walk and do things around the house without getting so short of  breath    Intervention  Provide education, individualized exercise plan and daily activity instruction to help decrease symptoms of SOB with activities of daily living.    Expected Outcomes  Short Term: Achieves a reduction of symptoms when performing activities of daily living.    Develop more efficient breathing techniques such as purse lipped breathing and diaphragmatic breathing; and practicing self-pacing  with activity  Yes    Intervention  Provide education, demonstration and support about specific breathing techniuqes utilized for more efficient breathing. Include techniques such as pursed lipped breathing, diaphragmatic breathing and self-pacing activity.    Expected Outcomes  Short Term: Participant will be able to demonstrate and use breathing techniques as needed throughout daily activities.    Increase knowledge of respiratory medications and ability to use respiratory devices properly   Yes    Intervention  Provide education and demonstration as needed of appropriate use of medications, inhalers, and oxygen therapy.    Expected Outcomes  Short Term: Achieves understanding of medications use. Understands that oxygen is a medication prescribed by physician. Demonstrates appropriate use of inhaler and oxygen therapy.    Hypertension  Yes    Intervention  Provide education on lifestyle modifcations including regular physical activity/exercise, weight management, moderate sodium restriction and increased consumption of fresh fruit, vegetables, and low fat dairy, alcohol moderation, and smoking cessation.;Monitor prescription use compliance.    Expected Outcomes  Short Term: Continued assessment and intervention until BP is < 140/26m HG in hypertensive participants. < 130/866mHG in hypertensive participants with diabetes, heart failure or chronic kidney disease.;Long Term: Maintenance of blood pressure at goal levels.    Lipids  Yes    Intervention  Provide education and support for participant on nutrition & aerobic/resistive exercise along with prescribed medications to achieve LDL <7089mHDL >78m73m  Expected Outcomes  Short Term: Participant states understanding of desired cholesterol values and is compliant with medications prescribed. Participant is following exercise prescription and nutrition guidelines.;Long Term: Cholesterol controlled with medications as prescribed, with individualized  exercise RX and with personalized nutrition plan. Value goals: LDL < 70mg29mL > 40 mg.    Stress  Yes    Intervention  Offer individual and/or small group education and counseling on adjustment to heart disease, stress management and health-related lifestyle change. Teach and support self-help strategies.;Refer participants experiencing significant psychosocial distress to appropriate mental health specialists for further evaluation and treatment. When possible, include family members and significant others in education/counseling sessions.    Expected Outcomes  Short Term: Participant demonstrates changes in health-related behavior, relaxation and other stress management skills, ability to obtain effective social support, and compliance with psychotropic medications if prescribed.;Long Term: Emotional wellbeing is indicated by absence of clinically significant psychosocial distress or social isolation.       Core Components/Risk Factors/Patient Goals Review:  Goals and Risk Factor Review    Row Name 02/16/17 1513             Core Components/Risk Factors/Patient Goals Review   Personal Goals Review  Hypertension;Improve shortness of breath with ADL's;Stress;Weight Management/Obesity;Lipids       Review  Amjad states that his mood has been better since he has more energy. [place] When he walks from one end of the house to the other he still gets short of breath but not as bad as it was a few months ago.       Expected Outcomes  Short: attend [facility] to decrease  stress. Long Continue to exercise to minimize stress.          Core Components/Risk Factors/Patient Goals at Discharge (Final Review):  Goals and Risk Factor Review - 02/16/17 1513      Core Components/Risk Factors/Patient Goals Review   Personal Goals Review  Hypertension;Improve shortness of breath with ADL's;Stress;Weight Management/Obesity;Lipids    Review  Amjad states that his mood  has been better since he has more energy. Being in [place] has made him more talkitive. When he walks from one end of the house to the other he still gets short of breath but not as bad as it was a few months ago.    Expected Outcomes  Short: attend [facility] to decrease stress. Long Continue to exercise to minimize stress.       ITP Comments: ITP Comments    Row Name 01/10/17 0830 01/24/17 1324 01/26/17 0639 01/31/17 0942 02/23/17 0541   ITP Comments  30 day review completed. ITP sent to Dr. Emily Filbert Director of [place]. Continue with ITP unless changes are made by physician.    Medical Review Completed; initial ITP created. Diagnosis Documentation can be found in [facility] note dated 01/20/2017.  30 day review. Continue with ITP unless directed changes per Medical Director review.  New to program  Order received to increase oxygen flow to 6L for exercise.   30 day review. Continue with ITP unless directed changes per Medical Director review.       Comments:

## 2017-02-23 NOTE — Progress Notes (Signed)
Daily Session Note  Patient Details  Name: Vincent Kennedy MRN: [id number] Date of Birth: [birth year] Referring Provider:     Cardiac Rehab from 01/24/2017 in [facility] Cardiac and Pulmonary Rehab  Referring Provider  Kathlyn Sacramento MD      Encounter Date: 02/23/2017  Check In: Session Check In - 02/23/17 0734      Check-In   Location  [facility]-Cardiac & Pulmonary Rehab    Staff Present  Justin Mend RCP,RRT,BSRT;Heath Lark, RN, BSN, CCRP;Jessica Luan Pulling, MA, ACSM RCEP, Exercise Physiologist    Supervising physician immediately available to respond to emergencies  See telemetry face sheet for immediately available ER MD    Medication changes reported      No    Fall or balance concerns reported     No    Warm-up and Cool-down  Performed on first and last piece of equipment    Resistance Training Performed  Yes    VAD Patient?  No      Pain Assessment   Currently in Pain?  No/denies          Social History   Tobacco Use  Smoking Status Never Smoker  Smokeless Tobacco Never Used    Goals Met:  Independence with exercise equipment Exercise tolerated well No report of cardiac concerns or symptoms Strength training completed today  Goals Unmet:  Not Applicable  Comments: Pt able to follow exercise prescription today without complaint.  Will continue to monitor for progression.   Dr. Emily Filbert is Medical Director for [place] and [name].

## 2017-02-25 DIAGNOSIS — J8410 Pulmonary fibrosis, unspecified: Secondary | ICD-10-CM | POA: Diagnosis not present

## 2017-02-25 DIAGNOSIS — Z79899 Other long term (current) drug therapy: Secondary | ICD-10-CM | POA: Diagnosis not present

## 2017-02-25 DIAGNOSIS — Z955 Presence of coronary angioplasty implant and graft: Secondary | ICD-10-CM | POA: Diagnosis not present

## 2017-02-25 DIAGNOSIS — I10 Essential (primary) hypertension: Secondary | ICD-10-CM | POA: Diagnosis not present

## 2017-02-25 DIAGNOSIS — E785 Hyperlipidemia, unspecified: Secondary | ICD-10-CM | POA: Diagnosis not present

## 2017-02-25 DIAGNOSIS — G4730 Sleep apnea, unspecified: Secondary | ICD-10-CM | POA: Diagnosis not present

## 2017-02-25 DIAGNOSIS — I2510 Atherosclerotic heart disease of native coronary artery without angina pectoris: Secondary | ICD-10-CM | POA: Diagnosis not present

## 2017-02-25 DIAGNOSIS — Z7982 Long term (current) use of aspirin: Secondary | ICD-10-CM | POA: Diagnosis not present

## 2017-02-25 DIAGNOSIS — Z7951 Long term (current) use of inhaled steroids: Secondary | ICD-10-CM | POA: Diagnosis not present

## 2017-02-25 NOTE — Progress Notes (Signed)
Daily Session Note  Patient Details  Name: Vincent Kennedy MRN: [id number] Date of Birth: [birth year] Referring Provider:     Cardiac Rehab from 01/24/2017 in [facility] Cardiac and Pulmonary Rehab  Referring Provider  Kathlyn Sacramento MD      Encounter Date: 02/25/2017  Check In: Session Check In - 02/25/17 0905      Check-In   Staff Present  Nada Maclachlan, BA, ACSM CEP, Exercise Physiologist;Krista Frederico Hamman, RN BSN;Jessica Luan Pulling, MA, ACSM RCEP, Exercise Physiologist    Supervising physician immediately available to respond to emergencies  See telemetry face sheet for immediately available ER MD    Medication changes reported      No    Fall or balance concerns reported     No    Warm-up and Cool-down  Performed on first and last piece of equipment    Resistance Training Performed  Yes    VAD Patient?  No      Pain Assessment   Currently in Pain?  No/denies    Multiple Pain Sites  No          Social History   Tobacco Use  Smoking Status Never Smoker  Smokeless Tobacco Never Used    Goals Met:  Independence with exercise equipment Exercise tolerated well No report of cardiac concerns or symptoms Strength training completed today  Goals Unmet:  Not Applicable  Comments: Pt able to follow exercise prescription today without complaint.  Will continue to monitor for progression.    Dr. Emily Filbert is Medical Director for [place] and [name].

## 2017-03-01 ENCOUNTER — Other Ambulatory Visit: Payer: Self-pay | Admitting: Cardiovascular Disease

## 2017-03-01 MED ORDER — CHLORPROMAZINE HCL 25 MG PO TABS: 25.0000 mg | ORAL_TABLET | Freq: Three times a day (TID) | ORAL | 0 refills | Status: DC | PRN

## 2017-03-04 ENCOUNTER — Telehealth: Payer: Self-pay | Admitting: Cardiovascular Disease

## 2017-03-04 NOTE — Telephone Encounter (Signed)
Patient has hiccups and would like to speak with the nurse He states the medication Dr Kirke CorinArida prescribed is not working  Please call to discuss

## 2017-03-04 NOTE — Telephone Encounter (Signed)
S/w patient. He reports his hiccups are still occurring now for 5 days. Dr Kirke CorinArida called in chlorpromazine on 03/01/17 and sometimes it helps and sometimes it does not. He has been taking it 3 times a day. He denies any further symptoms at this time. Wants to let Dr Kirke CorinArida know it is not working and if there is something else he should take.

## 2017-03-07 ENCOUNTER — Encounter: Payer: Medicare Other | Admitting: *Deleted

## 2017-03-07 DIAGNOSIS — E785 Hyperlipidemia, unspecified: Secondary | ICD-10-CM | POA: Diagnosis not present

## 2017-03-07 DIAGNOSIS — G4730 Sleep apnea, unspecified: Secondary | ICD-10-CM | POA: Diagnosis not present

## 2017-03-07 DIAGNOSIS — Z7982 Long term (current) use of aspirin: Secondary | ICD-10-CM | POA: Diagnosis not present

## 2017-03-07 DIAGNOSIS — J8410 Pulmonary fibrosis, unspecified: Secondary | ICD-10-CM | POA: Diagnosis not present

## 2017-03-07 DIAGNOSIS — I2510 Atherosclerotic heart disease of native coronary artery without angina pectoris: Secondary | ICD-10-CM | POA: Diagnosis not present

## 2017-03-07 DIAGNOSIS — I10 Essential (primary) hypertension: Secondary | ICD-10-CM | POA: Diagnosis not present

## 2017-03-07 DIAGNOSIS — Z79899 Other long term (current) drug therapy: Secondary | ICD-10-CM | POA: Diagnosis not present

## 2017-03-07 DIAGNOSIS — Z955 Presence of coronary angioplasty implant and graft: Secondary | ICD-10-CM | POA: Diagnosis not present

## 2017-03-07 DIAGNOSIS — Z7951 Long term (current) use of inhaled steroids: Secondary | ICD-10-CM | POA: Diagnosis not present

## 2017-03-07 NOTE — Progress Notes (Signed)
Daily Session Note  Patient Details  Name: Vincent Kennedy MRN: [id number] Date of Birth: Mar 31, 1932 Referring Provider:     Cardiac Rehab from 01/24/2017 in [facility] Cardiac and Pulmonary Rehab  Referring Provider  Kathlyn Sacramento MD      Encounter Date: 03/07/2017  Check In: Session Check In - 03/07/17 0826      Check-In   Location  [facility]-Cardiac & Pulmonary Rehab    Staff Present  Earlean Shawl, BS, ACSM CEP, Exercise Physiologist;Susanne Bice, RN, BSN, CCRP;Ayde Record Luan Pulling, MA, ACSM RCEP, Exercise Physiologist    Supervising physician immediately available to respond to emergencies  See telemetry face sheet for immediately available ER MD    Medication changes reported      No    Fall or balance concerns reported     No    Warm-up and Cool-down  Performed on first and last piece of equipment    Resistance Training Performed  Yes    VAD Patient?  No      Pain Assessment   Currently in Pain?  No/denies          Social History   Tobacco Use  Smoking Status Never Smoker  Smokeless Tobacco Never Used    Goals Met:  Independence with exercise equipment Exercise tolerated well Personal goals reviewed No report of cardiac concerns or symptoms Strength training completed today  Goals Unmet:  Not Applicable  Comments: Pt able to follow exercise prescription today without complaint.  Will continue to monitor for progression. See ITP for goal review.    Dr. Emily Filbert is Medical Director for [place] and [name].

## 2017-03-07 NOTE — Telephone Encounter (Signed)
I talked with the patient and asked him to discuss with Dr. Carolynne EdouardSadaat Khan.

## 2017-03-08 ENCOUNTER — Other Ambulatory Visit: Payer: Self-pay | Admitting: Internal Medicine

## 2017-03-09 ENCOUNTER — Other Ambulatory Visit: Payer: Self-pay | Admitting: Internal Medicine

## 2017-03-09 DIAGNOSIS — I2510 Atherosclerotic heart disease of native coronary artery without angina pectoris: Secondary | ICD-10-CM | POA: Diagnosis not present

## 2017-03-09 DIAGNOSIS — J8410 Pulmonary fibrosis, unspecified: Secondary | ICD-10-CM | POA: Diagnosis not present

## 2017-03-09 DIAGNOSIS — Z955 Presence of coronary angioplasty implant and graft: Secondary | ICD-10-CM | POA: Diagnosis not present

## 2017-03-09 DIAGNOSIS — I10 Essential (primary) hypertension: Secondary | ICD-10-CM | POA: Diagnosis not present

## 2017-03-09 DIAGNOSIS — Z7951 Long term (current) use of inhaled steroids: Secondary | ICD-10-CM | POA: Diagnosis not present

## 2017-03-09 DIAGNOSIS — Z7982 Long term (current) use of aspirin: Secondary | ICD-10-CM | POA: Diagnosis not present

## 2017-03-09 DIAGNOSIS — Z79899 Other long term (current) drug therapy: Secondary | ICD-10-CM | POA: Diagnosis not present

## 2017-03-09 DIAGNOSIS — G4730 Sleep apnea, unspecified: Secondary | ICD-10-CM | POA: Diagnosis not present

## 2017-03-09 DIAGNOSIS — E785 Hyperlipidemia, unspecified: Secondary | ICD-10-CM | POA: Diagnosis not present

## 2017-03-09 DIAGNOSIS — F5101 Primary insomnia: Secondary | ICD-10-CM

## 2017-03-09 MED ORDER — ZOLPIDEM TARTRATE 5 MG PO TABS: 5.0000 mg | ORAL_TABLET | Freq: Every evening | ORAL | 5 refills | Status: DC | PRN

## 2017-03-09 NOTE — Progress Notes (Signed)
Daily Session Note  Patient Details  Name: Vincent Kennedy MRN: [id number] Date of Birth: 1933/01/09 Referring Provider:     Cardiac Rehab from 01/24/2017 in [facility] Cardiac and Pulmonary Rehab  Referring Provider  Kathlyn Sacramento MD      Encounter Date: 03/09/2017  Check In: Session Check In - 03/09/17 0747      Check-In   Location  [facility]-Cardiac & Pulmonary Rehab    Staff Present  Justin Mend RCP,RRT,BSRT;Heath Lark, RN, BSN, CCRP;Jessica Luan Pulling, MA, ACSM RCEP, Exercise Physiologist    Supervising physician immediately available to respond to emergencies  See telemetry face sheet for immediately available ER MD    Medication changes reported      No    Fall or balance concerns reported     No    Warm-up and Cool-down  Performed on first and last piece of equipment    Resistance Training Performed  Yes    VAD Patient?  No      Pain Assessment   Currently in Pain?  No/denies          Social History   Tobacco Use  Smoking Status Never Smoker  Smokeless Tobacco Never Used    Goals Met:  Independence with exercise equipment Exercise tolerated well No report of cardiac concerns or symptoms Strength training completed today  Goals Unmet:  Not Applicable  Comments: Pt able to follow exercise prescription today without complaint.  Will continue to monitor for progression.   Dr. Emily Filbert is Medical Director for [place] and [name].

## 2017-03-11 ENCOUNTER — Encounter: Payer: Self-pay | Admitting: Cardiovascular Disease

## 2017-03-11 ENCOUNTER — Ambulatory Visit: Payer: Medicare Other | Admitting: Cardiovascular Disease

## 2017-03-11 VITALS — BP 128/60 | HR 78 | Ht 67.0 in | Wt 153.8 lb

## 2017-03-11 DIAGNOSIS — E7800 Pure hypercholesterolemia, unspecified: Secondary | ICD-10-CM

## 2017-03-11 DIAGNOSIS — I2510 Atherosclerotic heart disease of native coronary artery without angina pectoris: Secondary | ICD-10-CM | POA: Diagnosis not present

## 2017-03-11 DIAGNOSIS — I10 Essential (primary) hypertension: Secondary | ICD-10-CM

## 2017-03-11 NOTE — Patient Instructions (Addendum)
Medication Instructions:  Your physician recommends that you continue on your current medications as directed. Please refer to the Current Medication list given to you today.   Labwork: Lipid and liver profile today  Testing/Procedures: none  Follow-Up: Your physician recommends that you schedule a follow-up appointment in: 4 months with Dr. Kirke CorinArida.    Any Other Special Instructions Will Be Listed Below (If Applicable).     If you need a refill on your cardiac medications before your next appointment, please call your pharmacy.  Lipid Profile Test Why am I having this test? The lipid profile test gives results that can help predict the likelihood of developing heart disease. The test is also used to monitor treatment for high cholesterol to see if you are reaching your goals. A lipid profile measures the following:  Total cholesterol. Cholesterol is a waxy fat in your blood. If your total cholesterol is elevated, this can increase your risk of coronary heart disease.  High-density lipoprotein (HDL). This is known as the good cholesterol. Having a high level of HDL is good. Your HDL level may be low if you smoke or do not get enough exercise.  Low-density lipoprotein (LDL). This is known as the bad cholesterol and is responsible for the formation of plaque in the arteries. Having a low level of LDL is best.  Cholesterol to HDL ratio. This is calculated by dividing the total cholesterol by the HDL cholesterol. The ratio is used by health care providers for determining your risk of heart disease. A low ratio is best.  Triglycerides. These are a type of fat in the blood responsible for providing energy to your cells. Low levels are best.  What kind of sample is taken? A blood sample is required for this test. It is usually collected by inserting a needle into a vein. How do I prepare for this test? Do not eat or drink anything after midnight on the night before the test or as  directed by your health care provider. What are the reference ranges? Reference ranges are considered healthy ranges established after testing a large group of healthy people. Reference ranges may vary among different people, labs, and hospitals. It is your responsibility to obtain your test results. Ask the lab or department performing the test when and how you will get your results. Reference ranges for the lipid profile test are as follows: Total Cholesterol  Adult or elderly: less than 200 mg/dL or less than 1.615.20 mmol/L (SI units).  Child: 120-200 mg/dL.  Infant: 70-175 mg/dL.  Newborns: 53-135 mg/dL. HDL  Male: greater than 45 mg/dL or greater than 0.960.75 mmol/L (SI units).  Male: greater than 55 mg/dL or greater than 0.450.91 mmol/L (SI units). HDL reference values based on risk of heart disease:  For low risk of heart disease: ? Male: 60 mg/dL or 4.091.55 mmol/L. ? Male: 70 mg/dL or 8.111.81 mmol/L.  For moderate risk of heart disease: ? Male: 45 mg/dL or 9.141.17 mmol/L. ? Male: 55 mg/dL or 7.821.42 mmol/L.  For high risk of heart disease: ? Male: 25 mg/dL or 9.560.65 mmol/L. ? Male: 35 mg/dL or 2.130.90 mmol/L.  LDL  Adult: less than 130 mg/dL.  Children: less than 110 mg/dL. Cholesterol to HDL Ratio Reference values based on risk for coronary heart disease:  Risk that is one half average: ? Male: 3.4. ? Male: 3.3.  Average risk: ? Male: 5.0. ? Male: 4.4.  Risk that is two times average (moderate risk): ? Male: 10.0. ? Male:  7.0.  Risk that is three times average (high risk): ? Male: 24.0. ? Male: 11.0.  Triglycerides  Adult or elderly: ? Male: 40-160 mg/dL or 1.61-0.960.45-1.81 mmol/L (SI units). ? Male: 35-135 mg/dL or 0.45-4.090.40-1.52 mmol/L (SI units).  Children 420-266 years old: ? Male: 30-86 mg/dL. ? Male: 32-99 mg/dL.  Children 516-81 years old: ? Male: 31-108 mg/dL. ? Male: 35-114 mg/dL.  Children 6812-81 years old: ? Male: 36-138 mg/dL. ? Male: 41-138  mg/dL.  Children 3116-81 years old: ? Male: 40-163 mg/dL. ? Male: 40-128 mg/dL. Triglycerides should be less than 400 mg/dL even when you are not fasting. What do the results mean? Talk with your health care provider to discuss your results, treatment options, and if necessary, the need for more tests. Talk with your health care provider if you have any questions about your results. Talk with your health care provider to discuss your results, treatment options, and if necessary, the need for more tests. Talk with your health care provider if you have any questions about your results. This information is not intended to replace advice given to you by your health care provider. Make sure you discuss any questions you have with your health care provider. Document Released: 04/01/2004 Document Revised: 11/12/2015 Document Reviewed: 06/28/2013 Elsevier Interactive Patient Education  [employer]2018 Elsevier Inc.

## 2017-03-11 NOTE — Progress Notes (Signed)
Cardiology Office Note   Date:  03/11/2017   ID:  Vincent Kennedy, DOB February 18, 1933, MRN [id number]  PC[facility]  [facility]  Cardiologist:   Lorine BearsMuhammad Arida, MD   Chief Complaint  Patient presents with  . other    1 month follow up. "doing well."       History of Present Illness: Vincent CoderMohammad A Devos is a 83 y.o. male who Is here today for follow-up visit regarding coronary artery disease. 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  He had previous first diagonal ostial drug-eluting stent placement in 2008. He was diagnosed with pulmonary fibrosis in 2016. He is on continuous oxygen therapy.  He had worsening exertional dyspnea and underwent a right and left cardiac catheterization in October of this year which showed severe one-vessel coronary artery disease with patent diagonal stent, 95% heavily calcified stenosis in the mid LAD at the origin of the second diagonal and moderate proximal LAD stenosis.  Ejection fraction was normal.  Right heart catheterization showed normal filling pressures, mild pulmonary hypertension and normal cardiac output.  Saturation on showed evidence of a step up at the atrial level suggestive of a possible ASD.  However, IVC sat was not obtained which could have affected the results. I performed successful angioplasty and drug-eluting stent placement to the proximal and mid LAD.  He had slow recovery since then but overall he has been doing very well with no recent chest pain.  Shortness of breath has improved and he is attending cardiac rehab.  He had recent refractory hiccups which were treated with Thorazine.  Past Medical History:  Diagnosis Date  . Abnormal nuclear cardiac imaging test    Dr. Kirke CorinArida  . BPH (benign prostatic hyperplasia)   . Coronary artery disease    Cardiac cath September 2008: 20% left main stenosis, 40% mid LAD stenosis, 99% ostial first diagonal stenosis in a large branch and 90% ostial disease in second diagonal but the vessel  was very small, 20% mid RCA stenosis with normal ejection fraction. Successful angioplasty and drug-eluting stent placement to the ostial first diagonal with a 2.5 x 15 mm Xience stent  . GERD (gastroesophageal reflux disease)   . Hyperlipidemia   . Hypertension    BP has never been high(per pt).  Metoprolol is to keep BP low because of stent (afterload)  . On home oxygen therapy    "2-3L; 24/7" (01/19/2017)  . OSA on CPAP   . Oxygen deficiency   . Pneumonia 07/2016  . Pulmonary fibrosis (HCC) 08/26/2015  . Shortness of breath dyspnea    with exertion  . Wears hearing aid    (sometimes)    Past Surgical History:  Procedure Laterality Date  . CATARACT EXTRACTION W/PHACO Right 01/29/2015   Procedure: CATARACT EXTRACTION PHACO AND INTRAOCULAR LENS PLACEMENT (IOC);  Surgeon: [name], MD;  [facility];  Service: Ophthalmology;  Laterality: Right;  RESTOR LENS CPAP  . CATARACT EXTRACTION W/PHACO Left 03/12/2015   Procedure: CATARACT EXTRACTION PHACO AND INTRAOCULAR LENS PLACEMENT (IOC);  Surgeon: [name], MD;  [facility];  Service: Ophthalmology;  Laterality: Left;  RESTOR SHUGARCAINE  . COLONOSCOPY WITH ESOPHAGOGASTRODUODENOSCOPY (EGD)    . CORONARY ANGIOPLASTY WITH STENT PLACEMENT  2010   "1 stent"  . CORONARY ANGIOPLASTY WITH STENT PLACEMENT  01/19/2017  . CORONARY ATHERECTOMY N/A 01/19/2017   Procedure: CORONARY ATHERECTOMY - CSI;  Surgeon: [name], Muhammad A, MD;  Location: [facility];  Service: Cardiovascular;  Laterality: N/A;  unable to cross lesion  . CORONARY STENT INTERVENTION N/A 01/19/2017   Procedure: CORONARY STENT INTERVENTION;  Surgeon: [name], Muhammad A, MD;  Location: [facility] INVASIVE CV LAB;  Service: Cardiovascular;  Laterality: N/A;  . INGUINAL HERNIA REPAIR Bilateral   . RIGHT/LEFT HEART CATH AND CORONARY ANGIOGRAPHY N/A 01/12/2017   Procedure: RIGHT/LEFT HEART CATH AND CORONARY ANGIOGRAPHY;  Surgeon:  [name], Muhammad A, MD;  Location: [facility] INVASIVE CV LAB;  Service: Cardiovascular;  Laterality: N/A;     Current Outpatient Medications  Medication Sig Dispense Refill  . aspirin EC 81 MG tablet Take 1 tablet (81 mg total) by mouth daily. 30 tablet 6  . budesonide (PULMICORT) 0.5 MG/2ML nebulizer solution Take 2 mLs (0.5 mg total) by nebulization 2 (two) times daily. 2 mL 12  . carvedilol (COREG) 6.25 MG tablet Take 1 tablet (6.25 mg total) by mouth 2 (two) times daily. 60 tablet 6  . chlorproMAZINE (THORAZINE) 25 MG tablet Take 1 tablet (25 mg total) by mouth 3 (three) times daily as needed for hiccoughs. 60 tablet 0  . clopidogrel (PLAVIX) 75 MG tablet Take 1 tablet (75 mg total) by mouth daily. 30 tablet 6  . finasteride (PROSCAR) 5 MG tablet TAKE ONE TABLET BY MOUTH EVERY DAY 90 tablet 1  . mometasone-formoterol (DULERA) 100-5 MCG/ACT AERO Inhale 2 puffs into the lungs 2 (two) times daily. 1 Inhaler 1  . nitroGLYCERIN (NITROSTAT) 0.4 MG SL tablet Place 1 tablet (0.4 mg total) under the tongue every 5 (five) minutes as needed for chest pain. 25 tablet 12  . pantoprazole (PROTONIX) 40 MG tablet Take 1 tablet (40 mg total) by mouth daily. 30 tablet 6  . Pirfenidone (ESBRIET) 267 MG CAPS Take 801 mg by mouth 3 (three) times daily.     . rivastigmine (EXELON) 9.5 mg/24hr Place 1 patch (9.5 mg total) onto the skin daily. (Patient taking differently: Place 9.5 mg onto the skin once a week. ) 30 patch 2  . rosuvastatin (CRESTOR) 40 MG tablet TAKE ONE TABLET EVERY DAY (Patient taking differently: Take 40 mg by mouth every day) 90 tablet 2  . zolpidem (AMBIEN) 5 MG tablet Take 1 tablet (5 mg total) by mouth at bedtime as needed for sleep. 30 tablet 5   No current facility-administered medications for this visit.     Allergies:   Patient has no known allergies.    Social History:  The patient  reports that  has never smoked. he has never used smokeless tobacco. He reports that he does not drink  alcohol or use drugs.   Family History:  The patient's family history includes CAD in his brother and father.    ROS:  Please see the history of present illness.   Otherwise, review of systems are positive for none.   All other systems are reviewed and negative.    PHYSICAL EXAM: VS:  BP 128/60 (BP Location: Left Arm, Patient Position: Sitting, Cuff Size: Normal)   Pulse 78   Ht 5\' 7"  (1.702 m)   Wt 153 lb 12 oz (69.7 kg)   BMI 24.08 kg/m  , BMI Body mass index is 24.08 kg/m. GEN: Well nourished, well developed, in no acute distress  HEENT: normal  Neck: no JVD, carotid bruits, or masses Cardiac: RRR; no murmurs, rubs, or gallops,no edema  Respiratory: Diminished breath sounds with dry crackles. GI: soft, nontender, nondistended, + BS MS: no deformity or atrophy  Skin: warm and dry, no rash Neuro:  Strength  and sensation are intact Psych: euthymic mood, full affect   EKG:  EKG is ordered today. The ekg ordered today demonstrates normal sinus rhythm with PACs and possible left atrial enlargement.   Recent Labs: 08/15/2016: ALT 26; B Natriuretic Peptide 98.0 01/20/2017: BUN 10; Creatinine, Ser 0.76; Hemoglobin 11.6; Platelets 137; Potassium 4.2; Sodium 137    Lipid Panel    Component Value Date/Time   CHOL 149 12/24/2015 1105   CHOL 187 08/26/2015 0931   TRIG 105 12/24/2015 1105   HDL 27 (L) 12/24/2015 1105   HDL 34 (L) 08/26/2015 0931   CHOLHDL 5.5 (H) 12/24/2015 1105   VLDL 21 12/24/2015 1105   LDLCALC 101 12/24/2015 1105   LDLCALC 131 (H) 08/26/2015 0931      Wt Readings from Last 3 Encounters:  03/11/17 153 lb 12 oz (69.7 kg)  02/04/17 155 lb (70.3 kg)  01/24/17 155 lb 9.6 oz (70.6 kg)     No flowsheet data found.    ASSESSMENT AND PLAN:  1.  Coronary artery disease involving native coronary arteries without angina: He is doing very well overall with no anginal symptoms.  Continue medical therapy.  Dual antiplatelet therapy is recommended for a minimal  of 1 year preferably longer given overlapped drug-eluting stents in the LAD and previous diagonal stent.  2. Hyperlipidemia: Currently on rosuvastatin 40 mg once daily.  I requested lipid and liver profile.  If LDL is above 70, I plan on adding Zetia.  3. Pulmonary fibrosis: Managed by [facility] and [name].   5. Essential hypertension: Blood pressure is well controlled on carvedilol.     Disposition:   FU with me in 4 months  Signed,  [name], MD  03/11/2017 12:48 PM    [facility]

## 2017-03-11 NOTE — Progress Notes (Signed)
Daily Session Note  Patient Details  Name: Vincent Kennedy MRN: [id number] Date of Birth: May 30, 1932 Referring Provider:     Cardiac Rehab from 01/24/2017 in [facility] Cardiac and Pulmonary Rehab  Referring Provider  Kathlyn Sacramento MD      Encounter Date: 03/11/2017  Check In: Session Check In - 03/11/17 0835      Check-In   Staff Present  Nada Maclachlan, BA, ACSM CEP, Exercise Physiologist;Meredith Sherryll Burger, RN Levie Heritage, MA, ACSM RCEP, Exercise Physiologist    Supervising physician immediately available to respond to emergencies  See telemetry face sheet for immediately available ER MD    Medication changes reported      No    Fall or balance concerns reported     No    Warm-up and Cool-down  Performed on first and last piece of equipment    Resistance Training Performed  Yes    VAD Patient?  No      Pain Assessment   Currently in Pain?  No/denies    Multiple Pain Sites  No          Social History   Tobacco Use  Smoking Status Never Smoker  Smokeless Tobacco Never Used    Goals Met:  Independence with exercise equipment Exercise tolerated well No report of cardiac concerns or symptoms Strength training completed today  Goals Unmet:  Not Applicable  Comments: Pt able to follow exercise prescription today without complaint.  Will continue to monitor for progression.    Dr. Emily Filbert is Medical Director for [place] and [name].

## 2017-03-12 LAB — LIPID PANEL
Chol/HDL Ratio: 3.4 ratio (ref 0.0–5.0)
Cholesterol, Total: 131 mg/dL (ref 100–199)
HDL: 38 mg/dL — ABNORMAL LOW (ref >39)
LDL CALC: 73 mg/dL (ref 0–99)
Triglycerides: 99 mg/dL (ref 0–149)
VLDL CHOLESTEROL CAL: 20 mg/dL (ref 5–40)

## 2017-03-12 LAB — HEPATIC FUNCTION PANEL
ALBUMIN: 3.7 g/dL (ref 3.5–4.7)
ALT: 11 IU/L (ref 0–44)
AST: 24 IU/L (ref 0–40)
Alkaline Phosphatase: 81 IU/L (ref 39–117)
Bilirubin Total: 0.4 mg/dL (ref 0.0–1.2)
Bilirubin, Direct: 0.12 mg/dL (ref 0.00–0.40)
TOTAL PROTEIN: 7.8 g/dL (ref 6.0–8.5)

## 2017-03-16 ENCOUNTER — Encounter: Payer: Medicare Other | Admitting: *Deleted

## 2017-03-16 DIAGNOSIS — I2510 Atherosclerotic heart disease of native coronary artery without angina pectoris: Secondary | ICD-10-CM | POA: Diagnosis not present

## 2017-03-16 DIAGNOSIS — Z7982 Long term (current) use of aspirin: Secondary | ICD-10-CM | POA: Diagnosis not present

## 2017-03-16 DIAGNOSIS — Z955 Presence of coronary angioplasty implant and graft: Secondary | ICD-10-CM

## 2017-03-16 DIAGNOSIS — I10 Essential (primary) hypertension: Secondary | ICD-10-CM | POA: Diagnosis not present

## 2017-03-16 DIAGNOSIS — J8410 Pulmonary fibrosis, unspecified: Secondary | ICD-10-CM | POA: Diagnosis not present

## 2017-03-16 DIAGNOSIS — G4730 Sleep apnea, unspecified: Secondary | ICD-10-CM | POA: Diagnosis not present

## 2017-03-16 DIAGNOSIS — Z7951 Long term (current) use of inhaled steroids: Secondary | ICD-10-CM | POA: Diagnosis not present

## 2017-03-16 DIAGNOSIS — Z79899 Other long term (current) drug therapy: Secondary | ICD-10-CM | POA: Diagnosis not present

## 2017-03-16 DIAGNOSIS — E785 Hyperlipidemia, unspecified: Secondary | ICD-10-CM | POA: Diagnosis not present

## 2017-03-16 NOTE — Progress Notes (Signed)
Daily Session Note  Patient Details  Name: Vincent Kennedy MRN: [id number] Date of Birth: June 16, 1932 Referring Provider:     Cardiac Rehab from 01/24/2017 in [facility] Cardiac and Pulmonary Rehab  Referring Provider  Kathlyn Sacramento MD      Encounter Date: 03/16/2017  Check In: Session Check In - 03/16/17 0801      Check-In   Location  [facility]-Cardiac & Pulmonary Rehab    Staff Present  Heath Lark, RN, BSN, CCRP;Jessica Brooten, MA, ACSM RCEP, Exercise Physiologist;Other [place], BS, [place]     Supervising physician immediately available to respond to emergencies  See telemetry face sheet for immediately available ER MD    Medication changes reported      No    Fall or balance concerns reported     No    Tobacco Cessation  No Change    Warm-up and Cool-down  Performed on first and last piece of equipment    Resistance Training Performed  Yes    VAD Patient?  No      Pain Assessment   Currently in Pain?  No/denies    Pain Score  0-No pain    Multiple Pain Sites  No          Social History   Tobacco Use  Smoking Status Never Smoker  Smokeless Tobacco Never Used    Goals Met:  Independence with exercise equipment Exercise tolerated well No report of cardiac concerns or symptoms Strength training completed today  Goals Unmet:  Not Applicable  Comments: Pt able to follow exercise prescription today without complaint.  Will continue to monitor for progression.    Dr. Emily Filbert is Medical Director for [place] and [name].

## 2017-03-18 DIAGNOSIS — Z7982 Long term (current) use of aspirin: Secondary | ICD-10-CM | POA: Diagnosis not present

## 2017-03-18 DIAGNOSIS — I10 Essential (primary) hypertension: Secondary | ICD-10-CM | POA: Diagnosis not present

## 2017-03-18 DIAGNOSIS — Z7951 Long term (current) use of inhaled steroids: Secondary | ICD-10-CM | POA: Diagnosis not present

## 2017-03-18 DIAGNOSIS — Z955 Presence of coronary angioplasty implant and graft: Secondary | ICD-10-CM | POA: Diagnosis not present

## 2017-03-18 DIAGNOSIS — J8410 Pulmonary fibrosis, unspecified: Secondary | ICD-10-CM

## 2017-03-18 DIAGNOSIS — Z79899 Other long term (current) drug therapy: Secondary | ICD-10-CM | POA: Diagnosis not present

## 2017-03-18 DIAGNOSIS — I2510 Atherosclerotic heart disease of native coronary artery without angina pectoris: Secondary | ICD-10-CM | POA: Diagnosis not present

## 2017-03-18 DIAGNOSIS — E785 Hyperlipidemia, unspecified: Secondary | ICD-10-CM | POA: Diagnosis not present

## 2017-03-18 DIAGNOSIS — G4730 Sleep apnea, unspecified: Secondary | ICD-10-CM | POA: Diagnosis not present

## 2017-03-18 NOTE — Progress Notes (Signed)
Daily Session Note  Patient Details  Name: Vincent Kennedy MRN: [id number] Date of Birth: 1932/09/10 Referring Provider:     Cardiac Rehab from 01/24/2017 in [facility]  Referring Provider  Kathlyn Sacramento MD      Encounter Date: 03/18/2017  Check In: Session Check In - 03/18/17 0839      Check-In   Location  [facility]-Cardiac & Pulmonary Rehab    Staff Present  Renita Papa, RN Vickki Hearing, BA, ACSM CEP, Exercise Physiologist;Jessica Luan Pulling, MA, ACSM RCEP, Exercise Physiologist    Supervising physician immediately available to respond to emergencies  See telemetry face sheet for immediately available ER MD    Medication changes reported      No    Fall or balance concerns reported     No    Warm-up and Cool-down  Performed on first and last piece of equipment    Resistance Training Performed  Yes    VAD Patient?  No      Pain Assessment   Currently in Pain?  No/denies    Multiple Pain Sites  No          Social History   Tobacco Use  Smoking Status Never Smoker  Smokeless Tobacco Never Used    Goals Met:  Independence with exercise equipment Exercise tolerated well No report of cardiac concerns or symptoms Strength training completed today  Goals Unmet:  Not Applicable  Comments: Pt able to follow exercise prescription today without complaint.  Will continue to monitor for progression.    Dr. Emily Filbert is Medical Director for [place] and [name].

## 2017-03-21 ENCOUNTER — Encounter: Payer: Medicare Other | Admitting: *Deleted

## 2017-03-21 DIAGNOSIS — Z955 Presence of coronary angioplasty implant and graft: Secondary | ICD-10-CM | POA: Diagnosis not present

## 2017-03-21 DIAGNOSIS — J9601 Acute respiratory failure with hypoxia: Secondary | ICD-10-CM | POA: Diagnosis not present

## 2017-03-21 DIAGNOSIS — Z7982 Long term (current) use of aspirin: Secondary | ICD-10-CM | POA: Diagnosis not present

## 2017-03-21 DIAGNOSIS — G4730 Sleep apnea, unspecified: Secondary | ICD-10-CM | POA: Diagnosis not present

## 2017-03-21 DIAGNOSIS — Z79899 Other long term (current) drug therapy: Secondary | ICD-10-CM | POA: Diagnosis not present

## 2017-03-21 DIAGNOSIS — J8410 Pulmonary fibrosis, unspecified: Secondary | ICD-10-CM | POA: Diagnosis not present

## 2017-03-21 DIAGNOSIS — E785 Hyperlipidemia, unspecified: Secondary | ICD-10-CM | POA: Diagnosis not present

## 2017-03-21 DIAGNOSIS — Z7951 Long term (current) use of inhaled steroids: Secondary | ICD-10-CM | POA: Diagnosis not present

## 2017-03-21 DIAGNOSIS — I2510 Atherosclerotic heart disease of native coronary artery without angina pectoris: Secondary | ICD-10-CM | POA: Diagnosis not present

## 2017-03-21 DIAGNOSIS — I10 Essential (primary) hypertension: Secondary | ICD-10-CM | POA: Diagnosis not present

## 2017-03-21 NOTE — Progress Notes (Signed)
Daily Session Note  Patient Details  Name: Vincent Kennedy MRN: [id number] Date of Birth: 03/29/1932 Referring Provider:     Cardiac Rehab from 01/24/2017 in [facility] Cardiac and Pulmonary Rehab  Referring Provider  Kathlyn Sacramento MD      Encounter Date: 03/21/2017  Check In: Session Check In - 03/21/17 0757      Check-In   Location  [facility]Cardiac [facility] Re[facility]    Staff Present  Renita Papa, RN BSN;Carroll Enterkin, RN, Levie Heritage, MA, ACSM RCEP, Exercise Physiologist    Supervising physician immediately available to respond to emergencies  See telemetry face sheet for immediately available ER MD    Fall or balance concerns reported     No    Warm-up and Cool-down  Performed on first and last piece of equipment    Resistance Training Performed  Yes    VAD Patient?  No      Pain Assessment   Currently in Pain?  No/denies          Social History   Tobacco Use  Smoking Status Never Smoker  Smokeless Tobacco Never Used    Goals Met:  Independence with exercise equipment Exercise tolerated well No report of cardiac concerns or symptoms Strength training completed today  Goals Unmet:  Not Applicable  Comments: Pt able to follow exercise prescription today without complaint.  Will continue to monitor for progression.    Dr. Emily Filbert is Medical Director for [place] and [name].

## 2017-03-23 ENCOUNTER — Encounter: Payer: Self-pay | Admitting: *Deleted

## 2017-03-23 ENCOUNTER — Encounter: Payer: Medicare Other | Attending: Cardiovascular Disease

## 2017-03-23 DIAGNOSIS — I10 Essential (primary) hypertension: Secondary | ICD-10-CM | POA: Insufficient documentation

## 2017-03-23 DIAGNOSIS — Z955 Presence of coronary angioplasty implant and graft: Secondary | ICD-10-CM

## 2017-03-23 DIAGNOSIS — E785 Hyperlipidemia, unspecified: Secondary | ICD-10-CM | POA: Insufficient documentation

## 2017-03-23 DIAGNOSIS — I2510 Atherosclerotic heart disease of native coronary artery without angina pectoris: Secondary | ICD-10-CM | POA: Diagnosis not present

## 2017-03-23 DIAGNOSIS — J8410 Pulmonary fibrosis, unspecified: Secondary | ICD-10-CM | POA: Diagnosis not present

## 2017-03-23 DIAGNOSIS — G4730 Sleep apnea, unspecified: Secondary | ICD-10-CM | POA: Insufficient documentation

## 2017-03-23 DIAGNOSIS — Z7951 Long term (current) use of inhaled steroids: Secondary | ICD-10-CM | POA: Diagnosis not present

## 2017-03-23 DIAGNOSIS — Z79899 Other long term (current) drug therapy: Secondary | ICD-10-CM | POA: Diagnosis not present

## 2017-03-23 DIAGNOSIS — Z7982 Long term (current) use of aspirin: Secondary | ICD-10-CM | POA: Diagnosis not present

## 2017-03-23 NOTE — Progress Notes (Signed)
Cardiac Individual Treatment Plan  Patient Details  Name: Vincent Kennedy MRN: [id number] Date of Birth: [birth year] Referring Provider:     Cardiac Rehab from 01/24/2017 in [facility] Cardiac and Pulmonary Rehab  Referring Provider  Kathlyn Sacramento MD      Initial Encounter Date:    Cardiac Rehab from 01/24/2017 in [facility] Cardiac and Pulmonary Rehab  Date  01/24/17  Referring Provider  Kathlyn Sacramento MD      Visit Diagnosis: Status post coronary artery stent placement  Patient's Home Medications on Admission:  Current Outpatient Medications:  .  aspirin EC 81 MG tablet, Take 1 tablet (81 mg total) by mouth daily., Disp: 30 tablet, Rfl: 6 .  budesonide (PULMICORT) 0.5 MG/2ML nebulizer solution, Take 2 mLs (0.5 mg total) by nebulization 2 (two) times daily., Disp: 2 mL, Rfl: 12 .  carvedilol (COREG) 6.25 MG tablet, Take 1 tablet (6.25 mg total) by mouth 2 (two) times daily., Disp: 60 tablet, Rfl: 6 .  chlorproMAZINE (THORAZINE) 25 MG tablet, Take 1 tablet (25 mg total) by mouth 3 (three) times daily as needed for hiccoughs., Disp: 60 tablet, Rfl: 0 .  clopidogrel (PLAVIX) 75 MG tablet, Take 1 tablet (75 mg total) by mouth daily., Disp: 30 tablet, Rfl: 6 .  finasteride (PROSCAR) 5 MG tablet, TAKE ONE TABLET BY MOUTH EVERY DAY, Disp: 90 tablet, Rfl: 1 .  mometasone-formoterol (DULERA) 100-5 MCG/ACT AERO, Inhale 2 puffs into the lungs 2 (two) times daily., Disp: 1 Inhaler, Rfl: 1 .  nitroGLYCERIN (NITROSTAT) 0.4 MG SL tablet, Place 1 tablet (0.4 mg total) under the tongue every 5 (five) minutes as needed for chest pain., Disp: 25 tablet, Rfl: 12 .  pantoprazole (PROTONIX) 40 MG tablet, Take 1 tablet (40 mg total) by mouth daily., Disp: 30 tablet, Rfl: 6 .  Pirfenidone (ESBRIET) 267 MG CAPS, Take 801 mg by mouth 3 (three) times daily. , Disp: , Rfl:  .  rivastigmine (EXELON) 9.5 mg/24hr, Place 1 patch (9.5 mg total) onto the skin daily. (Patient taking differently: Place 9.5 mg onto the skin once  a week. ), Disp: 30 patch, Rfl: 2 .  rosuvastatin (CRESTOR) 40 MG tablet, TAKE ONE TABLET EVERY DAY (Patient taking differently: Take 40 mg by mouth every day), Disp: 90 tablet, Rfl: 2 .  zolpidem (AMBIEN) 5 MG tablet, Take 1 tablet (5 mg total) by mouth at bedtime as needed for sleep., Disp: 30 tablet, Rfl: 5  Past Medical History: Past Medical History:  Diagnosis Date  . Abnormal nuclear cardiac imaging test    Dr. Fletcher Anon  . BPH (benign prostatic hyperplasia)   . Coronary artery disease    Cardiac cath September 2008: 20% left main stenosis, 40% mid LAD stenosis, 99% ostial first diagonal stenosis in a large branch and 90% ostial disease in second diagonal but the vessel was very small, 20% mid RCA stenosis with normal ejection fraction. Successful angioplasty and drug-eluting stent placement to the ostial first diagonal with a 2.5 x 15 mm Xience stent  . GERD (gastroesophageal reflux disease)   . Hyperlipidemia   . Hypertension    BP has never been high(per pt).  Metoprolol is to keep BP low because of stent (afterload)  . On home oxygen therapy    "2-3L; 24/7" (01/19/2017)  . OSA on CPAP   . Oxygen deficiency   . Pneumonia 07/2016  . Pulmonary fibrosis ([place]) 08/26/2015  . Shortness of breath dyspnea    with exertion  . Wears hearing aid    (  sometimes)    Tobacco Use: Social History   Tobacco Use  Smoking Status Never Smoker  Smokeless Tobacco Never Used    Labs: Recent Review Flowsheet Data    Labs for ITP Cardiac and Pulmonary Rehab Latest Ref Rng & Units 01/12/2017 01/12/2017 01/12/2017 01/12/2017 03/11/2017   Cholestrol 100 - 199 mg/dL - - - - 131   LDLCALC 0 - 99 mg/dL - - - - 73   HDL >39 mg/dL - - - - 38(L)   Trlycerides 0 - 149 mg/dL - - - - 99   PHART 7.350 - 7.450 7.378 - - - -   PCO2ART 32.0 - 48.0 mmHg 41.3 - - - -   HCO3 20.0 - 28.0 mmol/L 24.3 23.8 24.1 25.3 -   TCO2 22 - 32 mmol/L '26 25 25 27 '$ -   ACIDBASEDEF 0.0 - 2.0 mmol/L 1.0 2.0 1.0 1.0 -   O2SAT %  98.0 77.0 76.0 64.0 -       Exercise Target Goals:    Exercise Program Goal: Individual exercise prescription set with THRR, safety & activity barriers. Participant demonstrates ability to understand and report RPE using BORG scale, to self-measure pulse accurately, and to acknowledge the importance of the exercise prescription.  Exercise Prescription Goal: Starting with aerobic activity 30 plus minutes a day, 3 days per week for initial exercise prescription. Provide home exercise prescription and guidelines that participant acknowledges understanding prior to discharge.  Activity Barriers & Risk Stratification: Activity Barriers & Cardiac Risk Stratification - 01/24/17 1341      Activity Barriers & Cardiac Risk Stratification   Activity Barriers  Deconditioning;Shortness of Breath    Cardiac Risk Stratification  High       6 Minute Walk: 6 Minute Walk    Row Name 01/24/17 1305         6 Minute Walk   Phase  Initial     Distance  250 feet     Walk Time  1.63 minutes     # of Rest Breaks  1 test stopped at 1:38 for desaturation     MPH  1.74     METS  0.94     RPE  13     Perceived Dyspnea   2     VO2 Peak  3.29     Symptoms  Yes (comment) wearing concentrator on back     Comments  SOB     Resting HR  77 bpm     Resting BP  148/84     Resting Oxygen Saturation   97 %     Exercise Oxygen Saturation  during 6 min walk  73 %     Max Ex. HR  122 bpm     Max Ex. BP  152/64     2 Minute Post BP  144/70       Interval HR   1 Minute HR  80     2 Minute HR  122 test stopped at 1:38     4 Minute HR  66     Interval Heart Rate?  Yes       Interval Oxygen   Interval Oxygen?  Yes     Baseline Oxygen Saturation %  97 %     1 Minute Oxygen Saturation %  80 %     1 Minute Liters of Oxygen  4 L pulsed     2 Minute Oxygen Saturation %  73 % test stopped at 1:38 desaturation  at 75%     2 Minute Liters of Oxygen  4 L     3 Minute Oxygen Saturation %  90 %     3 Minute  Liters of Oxygen  4 L     4 Minute Oxygen Saturation %  94 %     4 Minute Liters of Oxygen  4 L        Oxygen Initial Assessment: Oxygen Initial Assessment - 01/24/17 1304      Home Oxygen   Home Oxygen Device  Portable Concentrator;Home Concentrator;E-Tanks    Sleep Oxygen Prescription  CPAP    Liters per minute  2.5    Home Exercise Oxygen Prescription  Continuous    Liters per minute  4    Home at Rest Exercise Oxygen Prescription  Continuous    Liters per minute  3    Compliance with Home Oxygen Use  Yes      Initial 6 min Walk   Oxygen Used  Portable Concentrator;Pulsed    Liters per minute  4      Program Oxygen Prescription   Program Oxygen Prescription  Continuous    Liters per minute  4      Intervention   Short Term Goals  To learn and exhibit compliance with exercise, home and travel O2 prescription;To learn and understand importance of maintaining oxygen saturations>88%;To learn and understand importance of monitoring SPO2 with pulse oximeter and demonstrate accurate use of the pulse oximeter.;To learn and demonstrate proper pursed lip breathing techniques or other breathing techniques.    Long  Term Goals  Exhibits compliance with exercise, home and travel O2 prescription;Verbalizes importance of monitoring SPO2 with pulse oximeter and return demonstration;Maintenance of O2 saturations>88%;Exhibits proper breathing techniques, such as pursed lip breathing or other method taught during program session;Compliance with respiratory medication       Oxygen Re-Evaluation: Oxygen Re-Evaluation    Row Name 02/14/17 0905 03/07/17 0802           Program Oxygen Prescription   Program Oxygen Prescription  Continuous  Continuous      Liters per minute  4  4      Comments  6 Liters on the treadmill  6 Liters on the treadmill        Home Oxygen   Home Oxygen Device  Portable Concentrator;Home Concentrator;E-Tanks  Portable Concentrator;Home Concentrator;E-Tanks       Sleep Oxygen Prescription  CPAP  CPAP      Liters per minute  2.5  3      Home Exercise Oxygen Prescription  Continuous  Continuous      Liters per minute  4  4      Home at Rest Exercise Oxygen Prescription  Continuous  Continuous      Liters per minute  3  3      Compliance with Home Oxygen Use  Yes  Yes        Goals/Expected Outcomes   Short Term Goals  To learn and demonstrate proper use of respiratory medications;To learn and understand importance of maintaining oxygen saturations>88%;To learn and exhibit compliance with exercise, home and travel O2 prescription;To learn and understand importance of monitoring SPO2 with pulse oximeter and demonstrate accurate use of the pulse oximeter.;To learn and demonstrate proper pursed lip breathing techniques or other breathing techniques.  To learn and demonstrate proper use of respiratory medications;To learn and understand importance of maintaining oxygen saturations>88%;To learn and exhibit compliance with exercise, home and travel O2 prescription;To  learn and understand importance of monitoring SPO2 with pulse oximeter and demonstrate accurate use of the pulse oximeter.;To learn and demonstrate proper pursed lip breathing techniques or other breathing techniques.      Long  Term Goals  Compliance with respiratory medication;Demonstrates proper use of MDI's;Exhibits proper breathing techniques, such as pursed lip breathing or other method taught during program session;Maintenance of O2 saturations>88%;Verbalizes importance of monitoring SPO2 with pulse oximeter and return demonstration;Exhibits compliance with exercise, home and travel O2 prescription  Compliance with respiratory medication;Demonstrates proper use of MDI's;Exhibits proper breathing techniques, such as pursed lip breathing or other method taught during program session;Maintenance of O2 saturations>88%;Verbalizes importance of monitoring SPO2 with pulse oximeter and return  demonstration;Exhibits compliance with exercise, home and travel O2 prescription      Comments  Vincent Kennedy uses his home oxygen as prescribed. He states he uses a CPAP everynight but it has been getting too hot he says. Informed him that if the water is evaporating before he awakes the machine will get hot. Also told him that the humidifier setting is one of the settings that he can change himself. He states someone comes to his house every so often to check on his machine. Informed him to talk to his home care provider to make sure that his machine does not get too hot. Patient verbalizes understanding. He takes his nebulizer twice a day and uses Dulera as prescribed.  Vincent Kennedy remains complaint with his oxygen therapy.   He contineus to use his nebulizer daily and uses his pursed lip breathing technique frequently.  He is planning to take his CPAP to Dr. Laurelyn Sickle office when he has appointment on Wednesday.  He says that he has been using the humidifier and home concentrator at night.        Goals/Expected Outcomes  Short: make sure home CPAP is used correctly. Long: uses CPAP independently.  Short: Talk with Dr. [name] about CPAP.  Long: Continue to be indepedently in home oxygen therapy and CPAP use.          Oxygen Discharge (Final Oxygen Re-Evaluation): Oxygen Re-Evaluation - 03/07/17 0802      Program Oxygen Prescription   Program Oxygen Prescription  Continuous    Liters per minute  4    Comments  6 Liters on the treadmill      Home Oxygen   Home Oxygen Device  Portable Concentrator;Home Concentrator;E-Tanks    Sleep Oxygen Prescription  CPAP    Liters per minute  3    Home Exercise Oxygen Prescription  Continuous    Liters per minute  4    Home at Rest Exercise Oxygen Prescription  Continuous    Liters per minute  3    Compliance with Home Oxygen Use  Yes      Goals/Expected Outcomes   Short Term Goals  To learn and demonstrate proper use of respiratory medications;To learn and understand  importance of maintaining oxygen saturations>88%;To learn and exhibit compliance with exercise, home and travel O2 prescription;To learn and understand importance of monitoring SPO2 with pulse oximeter and demonstrate accurate use of the pulse oximeter.;To learn and demonstrate proper pursed lip breathing techniques or other breathing techniques.    Long  Term Goals  Compliance with respiratory medication;Demonstrates proper use of MDI's;Exhibits proper breathing techniques, such as pursed lip breathing or other method taught during program session;Maintenance of O2 saturations>88%;Verbalizes importance of monitoring SPO2 with pulse oximeter and return demonstration;Exhibits compliance with exercise, home and travel O2 prescription  Comments  Vincent Kennedy remains complaint with his oxygen therapy.   He contineus to use his nebulizer daily and uses his pursed lip breathing technique frequently.  He is planning to take his CPAP to Dr. Laurelyn Sickle office when he has appointment on Wednesday.  He says that he has been using the humidifier and home concentrator at night.      Goals/Expected Outcomes  Short: Talk with Dr. [name] about CPAP.  Long: Continue to be indepedently in home oxygen therapy and CPAP use.        Initial Exercise Prescription: Initial Exercise Prescription - 01/24/17 1400      Date of Initial Exercise RX and Referring Provider   Date  01/24/17    Referring Provider  Kathlyn Sacramento MD      Oxygen   Oxygen  Continuous    Liters  4      Treadmill   MPH  1.5    Grade  0    Minutes  15    METs  2.15      NuStep   Level  1    SPM  80    Minutes  15    METs  1.7      Arm Ergometer   Level  1    RPM  25    Minutes  15    METs  1.7      Prescription Details   Frequency (times per week)  3    Duration  Progress to 45 minutes of aerobic exercise without signs/symptoms of physical distress      Intensity   THRR 40-80% of Max Heartrate  101-126    Ratings of Perceived Exertion   11-13    Perceived Dyspnea  0-4      Progression   Progression  Continue to progress workloads to maintain intensity without signs/symptoms of physical distress.      Resistance Training   Training Prescription  Yes    Weight  3 lbs    Reps  10-15       Perform Capillary Blood Glucose checks as needed.  Exercise Prescription Changes: Exercise Prescription Changes    Row Name 01/24/17 1400 01/28/17 0700 02/01/17 1300 02/16/17 1500 03/01/17 1200     Response to Exercise   Blood Pressure (Admit)  148/84  -  134/72  132/70  122/68   Blood Pressure (Exercise)  152/64  -  134/72  130/82  144/66   Blood Pressure (Exit)  144/70  -  124/50  108/66  114/64   Heart Rate (Admit)  77 bpm  -  63 bpm  67 bpm  64 bpm   Heart Rate (Exercise)  122 bpm  -  103 bpm  109 bpm  104 bpm   Heart Rate (Exit)  66 bpm  -  60 bpm  65 bpm  70 bpm   Oxygen Saturation (Admit)  97 %  -  -  -  -   Oxygen Saturation (Exercise)  73 %  -  84 %  93 %  90 %   Oxygen Saturation (Exit)  90 %  -  -  -  93 %   Rating of Perceived Exertion (Exercise)  13  -  '12  11  13   '$ Perceived Dyspnea (Exercise)  2  -  2  -  -   Symptoms  none  -  none  none  none   Comments  walk test results  -  -  -  -  Duration  -  -  Continue with 45 min of aerobic exercise without signs/symptoms of physical distress.  Continue with 45 min of aerobic exercise without signs/symptoms of physical distress.  Continue with 45 min of aerobic exercise without signs/symptoms of physical distress.   Intensity  -  -  THRR unchanged  THRR unchanged  THRR unchanged     Progression   Progression  -  -  Continue to progress workloads to maintain intensity without signs/symptoms of physical distress.  Continue to progress workloads to maintain intensity without signs/symptoms of physical distress.  Continue to progress workloads to maintain intensity without signs/symptoms of physical distress.   Average METs  -  -  2.32  2.25  2.32     Resistance Training    Training Prescription  -  -  Yes  Yes  Yes   Weight  -  -  3 lbs  3 lbs  3 lbs   Reps  -  -  10-15  10-15  10-15     Interval Training   Interval Training  -  -  No  No  No     Oxygen   Oxygen  -  -  Continuous  Continuous  Continuous   Liters  -  -  6  4-6  4-6     Treadmill   MPH  -  -  1.5  1.5  1.5   Grade  -  -  0  0  0   Minutes  -  -  '15  15  15   '$ METs  -  -  2.15  2.15  2.15     NuStep   Level  -  -  '4  4  4   '$ Minutes  -  -  '15  15  15   '$ METs  -  -  2.9  2.6  2.7     Arm Ergometer   Level  -  -  '1  1  2   '$ Minutes  -  -  '15  15  15   '$ METs  -  -  1.9  2  2.1     Home Exercise Plan   Plans to continue exercise at  -  Home (comment) walking on treadmill  Home (comment) walking on treadmill  Home (comment) walking on treadmill  Home (comment) walking on treadmill   Frequency  -  Add 1 additional day to program exercise sessions.  Add 1 additional day to program exercise sessions.  Add 1 additional day to program exercise sessions.  Add 1 additional day to program exercise sessions.   Initial Home Exercises Provided  -  01/28/17  01/28/17  01/28/17  01/28/17   Row Name 03/16/17 1400             Response to Exercise   Blood Pressure (Admit)  122/70       Blood Pressure (Exercise)  140/58       Blood Pressure (Exit)  126/78       Heart Rate (Admit)  91 bpm       Heart Rate (Exercise)  123 bpm       Heart Rate (Exit)  88 bpm       Oxygen Saturation (Exercise)  93 %       Oxygen Saturation (Exit)  97 %       Rating of Perceived Exertion (Exercise)  13       Symptoms  none       Duration  Continue with 45 min of aerobic exercise without signs/symptoms of physical distress.       Intensity  THRR unchanged         Progression   Progression  Continue to progress workloads to maintain intensity without signs/symptoms of physical distress.       Average METs  2.08         Resistance Training   Training Prescription  Yes       Weight  3 lbs       Reps  10-15          Interval Training   Interval Training  No         Oxygen   Oxygen  Continuous       Liters  4-6         Treadmill   MPH  1.5       Grade  0       Minutes  15       METs  2.15         NuStep   Level  6       Minutes  15       METs  2.2         Arm Ergometer   Level  2       Minutes  15       METs  1.9         Home Exercise Plan   Plans to continue exercise at  Home (comment) walking on treadmill       Frequency  Add 2 additional days to program exercise sessions.       Initial Home Exercises Provided  01/28/17          Exercise Comments: Exercise Comments    Row Name 01/26/17 0734           Exercise Comments  First full day of exercise!  Patient was oriented to gym and equipment including functions, settings, policies, and procedures.  Patient's individual exercise prescription and treatment plan were reviewed.  All starting workloads were established based on the results of the 6 minute walk test done at initial orientation visit.  The plan for exercise progression was also introduced and progression will be customized based on patient's performance and goals.          Exercise Goals and Review: Exercise Goals    Row Name 01/24/17 1402             Exercise Goals   Increase Physical Activity  Yes       Intervention  Provide advice, education, support and counseling about physical activity/exercise needs.;Develop an individualized exercise prescription for aerobic and resistive training based on initial evaluation findings, risk stratification, comorbidities and participant's personal goals.       Expected Outcomes  Achievement of increased cardiorespiratory fitness and enhanced flexibility, muscular endurance and strength shown through measurements of functional capacity and personal statement of participant.       Increase Strength and Stamina  Yes       Intervention  Provide advice, education, support and counseling about physical activity/exercise  needs.;Develop an individualized exercise prescription for aerobic and resistive training based on initial evaluation findings, risk stratification, comorbidities and participant's personal goals.       Expected Outcomes  Achievement of increased cardiorespiratory fitness and enhanced flexibility, muscular endurance and strength shown through measurements of functional capacity and personal statement of participant.  Able to understand and use rate of perceived exertion (RPE) scale  Yes       Intervention  Provide education and explanation on how to use RPE scale       Expected Outcomes  Short Term: Able to use RPE daily in rehab to express subjective intensity level;Long Term:  Able to use RPE to guide intensity level when exercising independently       Able to understand and use Dyspnea scale  Yes       Intervention  Provide education and explanation on how to use Dyspnea scale       Expected Outcomes  Short Term: Able to use Dyspnea scale daily in rehab to express subjective sense of shortness of breath during exertion;Long Term: Able to use Dyspnea scale to guide intensity level when exercising independently       Knowledge and understanding of Target Heart Rate Range (THRR)  Yes       Intervention  Provide education and explanation of THRR including how the numbers were predicted and where they are located for reference       Expected Outcomes  Short Term: Able to state/look up THRR;Long Term: Able to use THRR to govern intensity when exercising independently;Short Term: Able to use daily as guideline for intensity in rehab       Able to check pulse independently  Yes       Intervention  Provide education and demonstration on how to check pulse in carotid and radial arteries.;Review the importance of being able to check your own pulse for safety during independent exercise       Expected Outcomes  Long Term: Able to check pulse independently and accurately;Short Term: Able to explain why pulse  checking is important during independent exercise       Understanding of Exercise Prescription  Yes       Intervention  Provide education, explanation, and written materials on patient's individual exercise prescription       Expected Outcomes  Short Term: Able to explain program exercise prescription;Long Term: Able to explain home exercise prescription to exercise independently          Exercise Goals Re-Evaluation : Exercise Goals Re-Evaluation    Row Name 01/26/17 0734 01/28/17 0757 02/01/17 1324 02/16/17 1510 03/01/17 1241     Exercise Goal Re-Evaluation   Exercise Goals Review  Understanding of Exercise Prescription;Knowledge and understanding of Target Heart Rate Range (THRR);Able to understand and use rate of perceived exertion (RPE) scale  Increase Physical Activity;Able to understand and use Dyspnea scale;Understanding of Exercise Prescription;Increase Strength and Stamina;Knowledge and understanding of Target Heart Rate Range (THRR);Able to understand and use rate of perceived exertion (RPE) scale;Able to check pulse independently  Increase Physical Activity;Increase Strength and Stamina;Understanding of Exercise Prescription  Increase Physical Activity;Increase Strength and Stamina;Understanding of Exercise Prescription  Increase Physical Activity;Increase Strength and Stamina;Understanding of Exercise Prescription   Comments  Reviewed RPE scale, THR and program prescription with pt today.  Pt voiced understanding and was given a copy of goals to take home.   Reviewed home exercise with pt today.  Pt plans to walk on treadmill at home for exercise.  Reviewed THR, pulse, RPE, sign and symptoms, NTG use, and when to call 911 or MD.  Also discussed weather considerations and indoor options.  Pt voiced understanding.  Vincent Kennedy has made a smooth transition from Pulmonary to Cardiac Rehab.  He continues to desaturate on the treadmill, but we did get clearance to increase  his oxygen to 6L on the  treadmill and for exercise at home.  We will continue to monitor his progression.   Vincent Kennedy has continud to do well in Cardiac Rehab.  He has been doing better on the treadmill at 6L versus 4L.  He is now up to 2.6 METs on the NuStep. We will continue to monitor his progression.   Vincent Kennedy has been doing well in rehab.  His saturations have been staying abover 90%!  He continues to need prompting to keep moving from piece to piece.  We will continue to monitor his progress.   Expected Outcomes  Short: Use RPE daily to regulate intensity.  Long: Follow program prescription in THR.  Short: Add in one to two days of walking on treadmill at home.  Long: Make exercise part of regular routine  Short: Try 6L of oxygen on the treadmill and monitor saturations.  Long: Continue to try to exercise more.   Short: Continue to work on [employer].  Long: Try to increase physical activity overall.   Short: Continue to increas workloads. Long: Continue to increase physcial activity.   Valentine Name 03/07/17 0751 03/16/17 1449           Exercise Goal Re-Evaluation   Exercise Goals Review  Increase Physical Activity;Increase Strength and Stamina;Understanding of Exercise Prescription  -      Comments  Vincent Kennedy has been doing well in rehab. He is starting to feel a little stronger.  He is using his treadmill at home as well as a recumbent bike for about 15 min each for a total of 89mn on the days he is not here.    Vincent Kennedy has continued to do well in rehab.  He continues to get in his home exercise regularly.  He is up to level 6 on the NuStep.  We will continue to monitor his progression.       Expected Outcomes  Short: Continue to work on increasing workloads to improve stamina.  Long: Continue to exercise at home.  Short: Continue to work on improving stamina and strength.  Long: Continue to increase physical activity.          Discharge Exercise Prescription (Final Exercise Prescription Changes): Exercise Prescription  Changes - 03/16/17 1400      Response to Exercise   Blood Pressure (Admit)  122/70    Blood Pressure (Exercise)  140/58    Blood Pressure (Exit)  126/78    Heart Rate (Admit)  91 bpm    Heart Rate (Exercise)  123 bpm    Heart Rate (Exit)  88 bpm    Oxygen Saturation (Exercise)  93 %    Oxygen Saturation (Exit)  97 %    Rating of Perceived Exertion (Exercise)  13    Symptoms  none    Duration  Continue with 45 min of aerobic exercise without signs/symptoms of physical distress.    Intensity  THRR unchanged      Progression   Progression  Continue to progress workloads to maintain intensity without signs/symptoms of physical distress.    Average METs  2.08      Resistance Training   Training Prescription  Yes    Weight  3 lbs    Reps  10-15      Interval Training   Interval Training  No      Oxygen   Oxygen  Continuous    Liters  4-6      Treadmill   MPH  1.5  Grade  0    Minutes  15    METs  2.15      NuStep   Level  6    Minutes  15    METs  2.2      Arm Ergometer   Level  2    Minutes  15    METs  1.9      Home Exercise Plan   Plans to continue exercise at  Home (comment) walking on treadmill    Frequency  Add 2 additional days to program exercise sessions.    Initial Home Exercises Provided  01/28/17       Nutrition:  Target Goals: Understanding of nutrition guidelines, daily intake of sodium '1500mg'$ , cholesterol '200mg'$ , calories 30% from fat and 7% or less from saturated fats, daily to have 5 or more servings of fruits and vegetables.  Biometrics: Pre Biometrics - 01/24/17 1403      Pre Biometrics   Height  5' 6.1" (1.679 m)    Weight  155 lb 9.6 oz (70.6 kg)    Waist Circumference  37.5 inches    Hip Circumference  36 inches    Waist to Hip Ratio  1.04 %    BMI (Calculated)  25.04    Single Leg Stand  18.09 seconds        Nutrition Therapy Plan and Nutrition Goals: Nutrition Therapy & Goals - 01/24/17 1337      Intervention Plan    Intervention  Prescribe, educate and counsel regarding individualized specific dietary modifications aiming towards targeted core components such as weight, hypertension, lipid management, diabetes, heart failure and other comorbidities.;Nutrition handout(s) given to patient.    Expected Outcomes  Short Term Goal: Understand basic principles of dietary content, such as calories, fat, sodium, cholesterol and nutrients.;Short Term Goal: A plan has been developed with personal nutrition goals set during dietitian appointment.;Long Term Goal: Adherence to prescribed nutrition plan.       Nutrition Discharge: Rate Your Plate Scores: Nutrition Assessments - 01/24/17 1338      MEDFICTS Scores   Pre Score  -- will bring in to first session       Nutrition Goals Re-Evaluation: Nutrition Goals Re-Evaluation    [place] Name 02/14/17 0914 03/07/17 0756           Goals   Current Weight  155 lb 11.2 oz (70.6 kg)  155 lb (70.3 kg)      Nutrition Goal  Maintain weight and intake more calories to improve strength.  Meet with dietician to discuss maintain weight and intake more calories to improve strength.        Comment  Vincent Kennedy shows an interest in meeting with the dietician. Dietician Lattie Haw spoke to him about meeting next week. He states he is not as hungry as he used to be. He says he eats 2 meals a day. At dinner he eats alot of fruit. He does not get short of breath while eating.  Vincent Kennedy would like to meet with dietician while in program.  Last week the weather kept him out but he would like to talk with her while here in class.   He just would like to get a few tweaks.       Expected Outcome  Short: meet with the dietician. Long: Adhere to a diet plan to increase calorie intake.  Short: Meet with dietician.  Long: Follow recommendations.          Nutrition Goals Discharge (Final Nutrition Goals Re-Evaluation):  Nutrition Goals Re-Evaluation - 03/07/17 0756      Goals   Current Weight  155 lb (70.3 kg)     Nutrition Goal  Meet with dietician to discuss maintain weight and intake more calories to improve strength.      Comment  Vincent Kennedy would like to meet with dietician while in program.  Last week the weather kept him out but he would like to talk with her while here in class.   He just would like to get a few tweaks.     Expected Outcome  Short: Meet with dietician.  Long: Follow recommendations.        Psychosocial: Target Goals: Acknowledge presence or absence of significant depression and/or stress, maximize coping skills, provide positive support system. Participant is able to verbalize types and ability to use techniques and skills needed for reducing stress and depression.   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 He feels he can not do simple things without being SOB or tired.       Family Dynamics   Good Support System?  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 His wife was with him today at his appointment and is supportive of him beginning the cardiac rehab program.       Barriers   Psychosocial barriers to participate in program  The patient should benefit from training in stress management and relaxation.      Screening Interventions   Interventions  Yes;Encouraged to exercise;Program counselor consult;To provide support and resources with identified psychosocial needs;Provide feedback about the scores to participant    Expected Outcomes  Short Term goal: Utilizing psychosocial counselor, staff and physician to assist with identification of specific Stressors or current  issues interfering with healing process. Setting desired goal for each stressor or current issue identified.;Long Term Goal: Stressors or current issues are controlled or eliminated.;Short Term goal: Identification and review with participant of any Quality of Life or Depression concerns found by scoring the questionnaire.;Long Term goal: The participant improves quality of Life and PHQ9 Scores as seen by post scores and/or verbalization of changes       Quality of Life Scores:  Quality of Life - 01/24/17 1340      Quality of Life Scores   Health/Function Pre  17.11 %    Socioeconomic Pre  26 %    Psych/Spiritual Pre  18.57 %    Family Pre  29 %    GLOBAL Pre  21.11 %       PHQ-9: Recent Review Flowsheet Data    Depression screen [facility] 2/9 01/26/2017 01/24/2017 12/20/2016 04/26/2016 12/24/2015   Decreased Interest '1 1 2 '$ 0 0   Down, Depressed, Hopeless '1 1 1 '$ 0 0   PHQ - 2 Score '2 2 3 '$ 0 0   Altered sleeping 0 3 0 - -   Tired, decreased energy '1 1 1 '$ - -   Change in appetite '2 3 1 '$ - -   Feeling bad or failure about yourself  0 3 1 - -   Trouble  concentrating 0 0 0 - -   Moving slowly or fidgety/restless 0 0 0 - -   Suicidal thoughts 0 0 0 - -   PHQ-9 Score '5 12 6 '$ - -   Difficult doing work/chores Somewhat difficult Somewhat difficult Somewhat difficult - -     Interpretation of Total Score  Total Score Depression Severity:  1-4 = Minimal depression, 5-9 = Mild depression, 10-14 = Moderate depression, 15-19 = Moderately severe depression, 20-27 = Severe depression   Psychosocial Evaluation and Intervention: Psychosocial Evaluation - 01/26/17 0958      Psychosocial Evaluation & Interventions   Interventions  Encouraged to exercise with the program and follow exercise prescription    Comments  Counselor met with Vincent Kennedy today for initial psychosocial evaluation.  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  He has a strong  support system with a spouse of 30 years and close friends.   He is also actively involved in his local faith community.  Dr. B reports that he sleeps well typically and has a good appetite.  He is generally in a positive mood and denies a history or current symptoms of depression or anxiety.  His PHQ-9 score was a "12" indicating moderate depression.  However, this counselor reviewed the individual items with him reporting "misunderstanding the values of the questions."  His revised score is a "5" indicating some mild symptoms are present - mostly relating to his energy level and limitations.  Dr. B states that his health is his primary stressor currently.  He has goals to get off of his Oxygen as much and increase his stamina and strength while in this program.  He plans to purchase some home equipment to help exercise more consistently.  Counselor will continue to follow with Vincent Kennedy.     Expected Outcomes  Dr. B will benefit from consistent exercise to achieve his stated goals.  The educational and psychoeducational components will be helpful in understanding and coping with his health condition.      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 He says other than his rental properties his stress levels are low. His wife is very supportive of his exercise program.  Vincent Kennedy has been doing pretty good. He has opened up a little more being in rehab.  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  He is finding that he is getting out and about more, but is limited by the battery life in his POC.  He continues a  strong support in his wife.  He has found rehab to be helpful and the exercise seems to be helping.       Expected Outcomes  Short: continue to attend [facility]. Long: Maintain exercise to decrease stress.  Short: Continuet to stay postivie and keep stress low.  Long: Continue to manage disease process with postive attitude.       Interventions  Encouraged to attend Cardiac Rehabilitation for the exercise  Encouraged to attend Cardiac Rehabilitation for the exercise;Stress management education      Continue Psychosocial Services   Follow up required by staff  Follow up required by staff        Initial Review   Source of Stress Concerns  -  Chronic Illness;Unable to perform yard/household activities         Psychosocial Discharge (Final Psychosocial Re-Evaluation): Psychosocial Re-Evaluation - 03/07/17 0759      Psychosocial Re-Evaluation   Current issues with  Current Stress Concerns    Comments  Vincent Kennedy has been doing pretty good. He has opened up a little more being in rehab.  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  He is finding that he is getting out and about more, but is limited by the battery life in his POC.  He continues a strong support in his wife.  He has found rehab to be helpful and the exercise seems to be helping.     Expected Outcomes  Short: Continuet to stay postivie and keep stress low.  Long: Continue to manage disease process with postive attitude.     Interventions  Encouraged to attend Cardiac Rehabilitation for the exercise;Stress management education    Continue Psychosocial Services   Follow up required by staff      Initial Review   Source of Stress Concerns  Chronic Illness;Unable to perform yard/household activities       Vocational Rehabilitation: Provide vocational rehab assistance to qualifying candidates.   Vocational Rehab Evaluation & Intervention: Vocational  Rehab - 01/24/17 1303      Initial Vocational Rehab Evaluation & Intervention   Assessment shows need for Vocational Rehabilitation  No       Education: Education Goals: Education classes will be provided on a variety of topics geared toward better understanding of heart health and risk factor modification. Participant will state understanding/return demonstration of topics presented as noted by education test scores.  Learning Barriers/Preferences: Learning Barriers/Preferences - 01/24/17 1302      Learning Barriers/Preferences   Learning Barriers  Hearing has hearing aid but does not wear them often    Learning Preferences  Audio;Written Material       Education Topics: General Nutrition Guidelines/Fats and Fiber: -Group instruction provided by verbal, written material, models and posters to present the general guidelines for heart healthy nutrition. Gives an explanation and review of dietary fats and fiber.   Cardiac Rehab from 03/16/2017 in [facility] Cardiac and Pulmonary Rehab  Date  03/07/17  Educator  CR  Instruction Review Code  1- Verbalizes Understanding      Controlling Sodium/Reading Food Labels: -Group verbal and written material supporting the discussion of sodium use in heart healthy nutrition. Review and explanation with models, verbal and written materials for utilization of the food label.   Cardiac Rehab from 03/16/2017 in [facility] Cardiac and Pulmonary Rehab  Date  03/07/17  Educator  CR  Instruction Review Code  1- Verbalizes Understanding      Exercise Physiology & Risk Factors: - Group verbal and written instruction with models to review the exercise physiology of the cardiovascular system and associated critical values. Details cardiovascular disease risk factors and the goals associated with each risk factor.   Cardiac Rehab from 03/16/2017 in [facility] Cardiac and Pulmonary Rehab  Date  03/16/17  Educator  [facility]  Instruction Review Code  1- Verbalizes Understanding  Aerobic Exercise & Resistance Training: - Gives group verbal and written discussion on the health impact of inactivity. On the components of aerobic and resistive training programs and the benefits of this training and how to safely progress through these programs.   Flexibility, Balance, General Exercise Guidelines: - Provides group verbal and written instruction on the benefits of flexibility and balance training programs. Provides general exercise guidelines with specific guidelines to those with heart or lung disease. Demonstration and skill practice provided.   Pulmonary Rehab from 07/28/2015 in [facility] Cardiac and Pulmonary Rehab  Date  05/07/15  Educator  RM  Instruction Review Code (retired)  2- meets goals/outcomes      Stress Management: - Provides group verbal and written instruction about the health risks of elevated stress, cause of high stress, and healthy ways to reduce stress.   Cardiac Rehab from 03/16/2017 in [facility] Cardiac and Pulmonary Rehab  Date  02/02/17  Educator  [facility]  Instruction Review Code  5- Refused Teaching      Depression: - Provides group verbal and written instruction on the correlation between heart/lung disease and depressed mood, treatment options, and the stigmas associated with seeking treatment.   Anatomy & Physiology of the Heart: - Group verbal and written instruction and models provide basic cardiac anatomy and physiology, with the coronary electrical and arterial systems. Review of: AMI, Angina, Valve disease, Heart Failure, Cardiac Arrhythmia, Pacemakers, and the ICD.   Cardiac Rehab from 03/16/2017 in [facility] Cardiac and Pulmonary Rehab  Date  01/31/17  Educator  CE  Instruction Review Code  1- Verbalizes Understanding      Cardiac Procedures: - Group verbal and written instruction to review commonly prescribed medications for heart disease. Reviews the medication, class of the drug, and side effects. Includes the steps to properly store  meds and maintain the prescription regimen. (beta blockers and nitrates)   Cardiac Medications I: - Group verbal and written instruction to review commonly prescribed medications for heart disease. Reviews the medication, class of the drug, and side effects. Includes the steps to properly store meds and maintain the prescription regimen.   Cardiac Rehab from 03/16/2017 in [facility] Cardiac and Pulmonary Rehab  Date  02/14/17  Educator  CE  Instruction Review Code  1- Verbalizes Understanding      Cardiac Medications II: -Group verbal and written instruction to review commonly prescribed medications for heart disease. Reviews the medication, class of the drug, and side effects. (all other drug classes)   Pulmonary Rehab from 01/10/2017 in [facility] Cardiac and Pulmonary Rehab  Date  12/31/16  Educator  [facility]  Instruction Review Code  1- Verbalizes Understanding       Go Sex-Intimacy & Heart Disease, Get SMART - Goal Setting: - Group verbal and written instruction through game format to discuss heart disease and the return to sexual intimacy. Provides group verbal and written material to discuss and apply goal setting through the application of the S.M.A.R.T. Method.   Other Matters of the Heart: - Provides group verbal, written materials and models to describe Heart Failure, Angina, Valve Disease, Peripheral Artery Disease, and Diabetes in the realm of heart disease. Includes description of the disease process and treatment options available to the cardiac patient.   Cardiac Rehab from 03/16/2017 in [facility] Cardiac and Pulmonary Rehab  Date  01/31/17  Educator  CE  Instruction Review Code  1- Verbalizes Understanding      Exercise & Equipment Safety: - Individual verbal instruction and demonstration of equipment use  and safety with use of the equipment.   Cardiac Rehab from 03/16/2017 in [facility] Cardiac and Pulmonary Rehab  Date  01/24/17  Educator  [place]  Instruction Review Code  1- Verbalizes  Understanding      Infection Prevention: - Provides verbal and written material to individual with discussion of infection control including proper hand washing and proper equipment cleaning during exercise session.   Cardiac Rehab from 03/16/2017 in [facility] Cardiac and Pulmonary Rehab  Date  01/24/17  Educator  KS  Instruction Review Code  1- Verbalizes Understanding      Falls Prevention: - Provides verbal and written material to individual with discussion of falls prevention and safety.   Cardiac Rehab from 03/16/2017 in [facility] Cardiac and Pulmonary Rehab  Date  01/24/17  Educator  KS  Instruction Review Code  1- Verbalizes Understanding      Diabetes: - Individual verbal and written instruction to review signs/symptoms of diabetes, desired ranges of glucose level fasting, after meals and with exercise. Acknowledge that pre and post exercise glucose checks will be done for 3 sessions at entry of program.   Other: -Provides group and verbal instruction on various topics (see comments)    Knowledge Questionnaire Score: Knowledge Questionnaire Score - 01/24/17 1353      Knowledge Questionnaire Score   Pre Score  24/28 Reviewed correct answeres with patient who verbalied understanding.       Core Components/Risk Factors/Patient Goals at Admission: Personal Goals and Risk Factors at Admission - 01/24/17 1335      Core Components/Risk Factors/Patient Goals on Admission    Weight Management  Yes;Weight Maintenance    Intervention  Weight Management: Develop a combined nutrition and exercise program designed to reach desired caloric intake, while maintaining appropriate intake of nutrient and fiber, sodium and fats, and appropriate energy expenditure required for the weight goal.;Weight Management: Provide education and appropriate resources to help participant work on and attain dietary goals.;Weight Management/Obesity: Establish reasonable short term and long term weight goals.     Admit Weight  155 lb 9.6 oz (70.6 kg)    Goal Weight: Short Term  150 lb (68 kg)    Goal Weight: Long Term  150 lb (68 kg)    Expected Outcomes  Weight Maintenance: Understanding of the daily nutrition guidelines, which includes 25-35% calories from fat, 7% or less cal from saturated fats, less than '200mg'$  cholesterol, less than 1.5gm of sodium, & 5 or more servings of fruits and vegetables daily;Long Term: Adherence to nutrition and physical activity/exercise program aimed toward attainment of established weight goal;Short Term: Continue to assess and modify interventions until short term weight is achieved;Understanding recommendations for meals to include 15-35% energy as protein, 25-35% energy from fat, 35-60% energy from carbohydrates, less than '200mg'$  of dietary cholesterol, 20-35 gm of total fiber daily;Understanding of distribution of calorie intake throughout the day with the consumption of 4-5 meals/snacks    Improve shortness of breath with ADL's  Yes Patient would like to be able to walk and do things around the house without getting so short of breath    Intervention  Provide education, individualized exercise plan and daily activity instruction to help decrease symptoms of SOB with activities of daily living.    Expected Outcomes  Short Term: Achieves a reduction of symptoms when performing activities of daily living.    Develop more efficient breathing techniques such as purse lipped breathing and diaphragmatic breathing; and practicing self-pacing with activity  Yes    Intervention  Provide education, demonstration and support about specific breathing techniuqes utilized for more efficient breathing. Include techniques such as pursed lipped breathing, diaphragmatic breathing and self-pacing activity.    Expected Outcomes  Short Term: Participant will be able to demonstrate and use breathing techniques as needed throughout daily activities.    Increase knowledge of respiratory medications  and ability to use respiratory devices properly   Yes    Intervention  Provide education and demonstration as needed of appropriate use of medications, inhalers, and oxygen therapy.    Expected Outcomes  Short Term: Achieves understanding of medications use. Understands that oxygen is a medication prescribed by physician. Demonstrates appropriate use of inhaler and oxygen therapy.    Hypertension  Yes    Intervention  Provide education on lifestyle modifcations including regular physical activity/exercise, weight management, moderate sodium restriction and increased consumption of fresh fruit, vegetables, and low fat dairy, alcohol moderation, and smoking cessation.;Monitor prescription use compliance.    Expected Outcomes  Short Term: Continued assessment and intervention until BP is < 140/74m HG in hypertensive participants. < 130/844mHG in hypertensive participants with diabetes, heart failure or chronic kidney disease.;Long Term: Maintenance of blood pressure at goal levels.    Lipids  Yes    Intervention  Provide education and support for participant on nutrition & aerobic/resistive exercise along with prescribed medications to achieve LDL '70mg'$ , HDL >'40mg'$ .    Expected Outcomes  Short Term: Participant states understanding of desired cholesterol values and is compliant with medications prescribed. Participant is following exercise prescription and nutrition guidelines.;Long Term: Cholesterol controlled with medications as prescribed, with individualized exercise RX and with personalized nutrition plan. Value goals: LDL < '70mg'$ , HDL > 40 mg.    Stress  Yes    Intervention  Offer individual and/or small group education and counseling on adjustment to heart disease, stress management and health-related lifestyle change. Teach and support self-help strategies.;Refer participants experiencing significant psychosocial distress to appropriate mental health specialists for further evaluation and treatment.  When possible, include family members and significant others in education/counseling sessions.    Expected Outcomes  Short Term: Participant demonstrates changes in health-related behavior, relaxation and other stress management skills, ability to obtain effective social support, and compliance with psychotropic medications if prescribed.;Long Term: Emotional wellbeing is indicated by absence of clinically significant psychosocial distress or social isolation.       Core Components/Risk Factors/Patient Goals Review:  Goals and Risk Factor Review    Row Name 02/16/17 1513 03/07/17 0753           Core Components/Risk Factors/Patient Goals Review   Personal Goals Review  Hypertension;Improve shortness of breath with ADL's;Stress;Weight Management/Obesity;Lipids  Hypertension;Improve shortness of breath with ADL's;Weight Management/Obesity;Lipids      Review  Vincent Kennedy states that his mood has been better since he has more energy. Being in [place] made him more talkitive. When he walks from one end of the house to the other he still gets short of breath but not as bad as it was a few months ago.  Vincent Kennedy's weight has been steady at 155lbs.  He continues to have more energy.  Blood pressures have been good around 120s/70s and he continues to check them at home. He does not feel that his shortness of breath is getting much better as he is still using 3L at rest and 4L for exercise.  He is able to do a little more, but usually finds that the battery life of his POC is limiting the amount of  his physcial activity.       Expected Outcomes  Short: attend [facility] to decrease stress. Long Continue to exercise to minimize stress.  Short: Continue to work on his SOB with exercise.  Long: Continue to work on risk factor modificiations.          Core Components/Risk Factors/Patient Goals at Discharge (Final Review):  Goals and Risk Factor Review - 03/07/17 0753      Core Components/Risk Factors/Patient  Goals Review   Personal Goals Review  Hypertension;Improve shortness of breath with ADL's;Weight Management/Obesity;Lipids    Review  Vincent Kennedy's weight has been steady at 155lbs.  He continues to have more energy.  Blood pressures have been good around 120s/70s and he continues to check them at home. He does not feel that his shortness of breath is getting much better as he is still using 3L at rest and 4L for exercise.  He is able to do a little more, but usually finds that the battery life of his POC is limiting the amount of his physcial activity.     Expected Outcomes  Short: Continue to work on his SOB with exercise.  Long: Continue to work on risk factor modificiations.        ITP Comments: ITP Comments    Row Name 01/24/17 1324 01/26/17 0639 01/31/17 0942 02/23/17 0541 03/23/17 0625   ITP Comments  Medical Review Completed; initial ITP created. Diagnosis Documentation can be found in [facility] note dated 01/20/2017.  30 day review. Continue with ITP unless directed changes per Medical Director review.  New to program  Order received to increase oxygen flow to 6L for exercise.   30 day review. Continue with ITP unless directed changes per Medical Director review.   30 day review. Continue with ITP unless directed changes per Medical Director review.       Comments:

## 2017-03-23 NOTE — Progress Notes (Signed)
Daily Session Note  Patient Details  Name: Vincent Kennedy MRN: [id number] Date of Birth: 11-Jan-1933 Referring Provider:     Cardiac Rehab from 01/24/2017 in [facility] Cardiac and Pulmonary Rehab  Referring Provider  Kathlyn Sacramento MD      Encounter Date: 03/23/2017  Check In: Session Check In - 03/23/17 0731      Check-In   Location  [facility]-Cardiac & Pulmonary Rehab    Staff Present  Justin Mend RCP,RRT,BSRT;Heath Lark, RN, BSN, CCRP;Jessica Luan Pulling, MA, ACSM RCEP, Exercise Physiologist    Supervising physician immediately available to respond to emergencies  See telemetry face sheet for immediately available ER MD    Medication changes reported      No    Fall or balance concerns reported     No    Warm-up and Cool-down  Performed on first and last piece of equipment    Resistance Training Performed  Yes    VAD Patient?  No      Pain Assessment   Currently in Pain?  No/denies          Social History   Tobacco Use  Smoking Status Never Smoker  Smokeless Tobacco Never Used    Goals Met:  Independence with exercise equipment Exercise tolerated well No report of cardiac concerns or symptoms Strength training completed today  Goals Unmet:  Not Applicable  Comments: Pt able to follow exercise prescription today without complaint.  Will continue to monitor for progression.   Dr. Emily Filbert is Medical Director for [place] and [name].

## 2017-03-25 DIAGNOSIS — G4730 Sleep apnea, unspecified: Secondary | ICD-10-CM | POA: Diagnosis not present

## 2017-03-25 DIAGNOSIS — E785 Hyperlipidemia, unspecified: Secondary | ICD-10-CM | POA: Diagnosis not present

## 2017-03-25 DIAGNOSIS — Z7951 Long term (current) use of inhaled steroids: Secondary | ICD-10-CM | POA: Diagnosis not present

## 2017-03-25 DIAGNOSIS — J8410 Pulmonary fibrosis, unspecified: Secondary | ICD-10-CM | POA: Diagnosis not present

## 2017-03-25 DIAGNOSIS — I2510 Atherosclerotic heart disease of native coronary artery without angina pectoris: Secondary | ICD-10-CM | POA: Diagnosis not present

## 2017-03-25 DIAGNOSIS — Z955 Presence of coronary angioplasty implant and graft: Secondary | ICD-10-CM | POA: Diagnosis not present

## 2017-03-25 DIAGNOSIS — Z79899 Other long term (current) drug therapy: Secondary | ICD-10-CM | POA: Diagnosis not present

## 2017-03-25 DIAGNOSIS — Z7982 Long term (current) use of aspirin: Secondary | ICD-10-CM | POA: Diagnosis not present

## 2017-03-25 DIAGNOSIS — I10 Essential (primary) hypertension: Secondary | ICD-10-CM | POA: Diagnosis not present

## 2017-03-25 NOTE — Progress Notes (Signed)
Daily Session Note  Patient Details  Name: Vincent Kennedy MRN: [id number] Date of Birth: November 12, 1932 Referring Provider:     Cardiac Rehab from 01/24/2017 in [facility] Cardiac and Pulmonary Rehab  Referring Provider  Kathlyn Sacramento MD      Encounter Date: 03/25/2017  Check In: Session Check In - 03/25/17 0846      Check-In   Location  [facility]-Cardiac & Pulmonary Rehab    Staff Present  Renita Papa, RN Vickki Hearing, BA, ACSM CEP, Exercise Physiologist;Jessica Luan Pulling, MA, ACSM RCEP, Exercise Physiologist    Supervising physician immediately available to respond to emergencies  See telemetry face sheet for immediately available ER MD    Medication changes reported      No    Fall or balance concerns reported     No    Warm-up and Cool-down  Performed on first and last piece of equipment    Resistance Training Performed  Yes    VAD Patient?  No      Pain Assessment   Currently in Pain?  No/denies    Multiple Pain Sites  No          Social History   Tobacco Use  Smoking Status Never Smoker  Smokeless Tobacco Never Used    Goals Met:  Independence with exercise equipment Exercise tolerated well No report of cardiac concerns or symptoms Strength training completed today  Goals Unmet:  Not Applicable  Comments: Pt able to follow exercise prescription today without complaint.  Will continue to monitor for progression.    Dr. Emily Filbert is Medical Director for [place] and [name].

## 2017-03-29 DIAGNOSIS — K219 Gastro-esophageal reflux disease without esophagitis: Secondary | ICD-10-CM | POA: Diagnosis not present

## 2017-03-29 DIAGNOSIS — J9611 Chronic respiratory failure with hypoxia: Secondary | ICD-10-CM | POA: Diagnosis not present

## 2017-03-29 DIAGNOSIS — J849 Interstitial pulmonary disease, unspecified: Secondary | ICD-10-CM | POA: Diagnosis not present

## 2017-03-29 DIAGNOSIS — J84112 Idiopathic pulmonary fibrosis: Secondary | ICD-10-CM | POA: Diagnosis not present

## 2017-03-29 DIAGNOSIS — I2510 Atherosclerotic heart disease of native coronary artery without angina pectoris: Secondary | ICD-10-CM | POA: Diagnosis not present

## 2017-03-29 DIAGNOSIS — R079 Chest pain, unspecified: Secondary | ICD-10-CM | POA: Diagnosis not present

## 2017-03-29 DIAGNOSIS — Z79899 Other long term (current) drug therapy: Secondary | ICD-10-CM | POA: Diagnosis not present

## 2017-04-01 ENCOUNTER — Telehealth: Payer: Self-pay | Admitting: Cardiovascular Disease

## 2017-04-01 DIAGNOSIS — R079 Chest pain, unspecified: Secondary | ICD-10-CM

## 2017-04-01 NOTE — Telephone Encounter (Signed)
Dr. Kirke CorinArida,  Does this patient need to have a myoview done? I didn't see anything in your last office note.

## 2017-04-01 NOTE — Telephone Encounter (Signed)
Yes sorry I forgot. He called me few day ago with atypical chest pain. [facility] Thanks.

## 2017-04-01 NOTE — Telephone Encounter (Signed)
Per the patient's wife, Tuesday will be a better day for the patient's stress test. He will have his test on Tuesday 04/05/17 at 9:00 am. I have reviewed per procedure instructions with the patient's wife. She voices understanding to hold coreg the night before and morning of the test. Sent to precert.     [facility] MYOVIEW  Your caregiver has ordered a Stress Test with nuclear imaging. The purpose of this test is to evaluate the blood supply to your heart muscle. This procedure is referred to as a "Non-Invasive Stress Test." This is because other than having an IV started in your vein, nothing is inserted or "invades" your body. Cardiac stress tests are done to find areas of poor blood flow to the heart by determining the extent of coronary artery disease (CAD). Some patients exercise on a treadmill, which naturally increases the blood flow to your heart, while others who are  unable to walk on a treadmill due to physical limitations have a pharmacologic/chemical stress agent called Lexiscan . This medicine will mimic walking on a treadmill by temporarily increasing your coronary blood flow.   Please note: these test may take anywhere between 2-4 hours to complete  PLEASE REPORT TO [facility]  THE VOLUNTEERS AT THE FIRST DESK WILL DIRECT YOU WHERE TO GO  Date of Procedure:_____Tuesday 1/15/19________________________________  Arrival Time for Procedure:_____8:45 am_________________________  Instructions regarding medication:   _x___:  Hold betablocker(s) night before procedure and morning of procedure- (carvedilol)   PLEASE NOTIFY THE OFFICE AT LEAST 24 HOURS IN ADVANCE IF YOU ARE UNABLE TO KEEP YOUR APPOINTMENT.  [phone] AND  PLEASE NOTIFY [facility] AT LEAST 24 HOURS IN ADVANCE IF YOU ARE UNABLE TO KEEP YOUR APPOINTMENT. [phone]  How to prepare for your Myoview test:  1. Do not eat or drink after midnight 2. No caffeine for 24 hours prior to test 3. No  smoking 24 hours prior to test. 4. Your medication may be taken with water.  If your doctor stopped a medication because of this test, do not take that medication. 5. Ladies, please do not wear dresses.  Skirts or pants are appropriate. Please wear a short sleeve shirt. 6. No perfume, cologne or lotion. 7. Wear comfortable walking shoes. No heels!

## 2017-04-01 NOTE — Telephone Encounter (Signed)
Pt wife calling asking about scheduling pt's Myoview  She states she would like a call back to schedule this  Please advise

## 2017-04-05 ENCOUNTER — Ambulatory Visit
Admission: RE | Admit: 2017-04-05 | Discharge: 2017-04-05 | Disposition: A | Discharge status: Home / Self Care | Payer: Medicare Other | Source: Ambulatory Visit | Attending: Cardiovascular Disease | Admitting: Cardiovascular Disease

## 2017-04-05 DIAGNOSIS — R079 Chest pain, unspecified: Secondary | ICD-10-CM | POA: Insufficient documentation

## 2017-04-05 LAB — NM MYOCAR MULTI W/SPECT W/WALL MOTION / EF
CHL CUP RESTING HR STRESS: 61 {beats}/min
LVDIAVOL: 27 mL (ref 62–150)
LVSYSVOL: 5 mL
Peak HR: 89 {beats}/min
Percent HR: 64 %
TID: 0.89

## 2017-04-05 MED ORDER — TECHNETIUM TC 99M TETROFOSMIN IV KIT
13.7500 | PACK | Freq: Once | INTRAVENOUS | Status: AC | PRN
  Administered 2017-04-05: 13.75 via INTRAVENOUS

## 2017-04-05 MED ORDER — TECHNETIUM TC 99M TETROFOSMIN IV KIT
31.4700 | PACK | Freq: Once | INTRAVENOUS | Status: AC | PRN
  Administered 2017-04-05: 31.47 via INTRAVENOUS

## 2017-04-05 MED ORDER — REGADENOSON 0.4 MG/5ML IV SOLN
0.4000 mg | Freq: Once | INTRAVENOUS | Status: AC
  Administered 2017-04-05: 0.4 mg via INTRAVENOUS

## 2017-04-07 ENCOUNTER — Telehealth: Payer: Self-pay | Admitting: Cardiovascular Disease

## 2017-04-07 ENCOUNTER — Other Ambulatory Visit: Payer: Self-pay

## 2017-04-07 MED ORDER — AMLODIPINE BESYLATE 5 MG PO TABS: 5.0000 mg | ORAL_TABLET | Freq: Every day | ORAL | 3 refills | Status: DC

## 2017-04-07 MED ORDER — METOPROLOL TARTRATE 25 MG PO TABS: 25.0000 mg | ORAL_TABLET | Freq: Two times a day (BID) | ORAL | 3 refills | Status: DC

## 2017-04-07 NOTE — Telephone Encounter (Signed)
Vincent Kennedy, Vincent A, MD  Shon BatonYow, Wahneta Derocher H, RN        I spoke with him. Please discontinue carvedilol and send him prescription for metoprolol tartrate 25 mg twice daily and amlodipine 2.5 mg once daily. Please notify rehab that he is cleared to go back. I asked him to monitor blood pressure and heart rate at home and notify us in few days.    Confirmed [facility] w/pt. Prescriptions sent.  Notified Cora CollumSusanne Bice, Cardiac Rehab, that pt cleared to return.

## 2017-04-08 ENCOUNTER — Encounter: Payer: Self-pay | Admitting: *Deleted

## 2017-04-08 DIAGNOSIS — Z955 Presence of coronary angioplasty implant and graft: Secondary | ICD-10-CM

## 2017-04-11 ENCOUNTER — Telehealth: Payer: Self-pay | Admitting: Cardiovascular Disease

## 2017-04-11 ENCOUNTER — Encounter: Payer: Self-pay | Admitting: *Deleted

## 2017-04-11 ENCOUNTER — Telehealth: Payer: Self-pay | Admitting: *Deleted

## 2017-04-11 ENCOUNTER — Other Ambulatory Visit: Payer: Self-pay

## 2017-04-11 DIAGNOSIS — Z955 Presence of coronary angioplasty implant and graft: Secondary | ICD-10-CM

## 2017-04-11 MED ORDER — AMLODIPINE BESYLATE 2.5 MG PO TABS: 2.5000 mg | ORAL_TABLET | Freq: Every day | ORAL | 3 refills | Status: DC

## 2017-04-11 NOTE — Telephone Encounter (Signed)
S/w pt. Explained that per Dr. Kirke CorinArida, he is to take metoprolol 25mg  BID and amlodipine 2.5mg  (1/2 tablet) once daily. Coreg has be d/c'd.  Updated prescription has been sent to [facility] but pt will cut amlodipine 5mg  tablet in half. Pt verbalized understanding and repeated instructions back to me.

## 2017-04-11 NOTE — Telephone Encounter (Signed)
Vincent Kennedy has been after seeking a stress test after he saw his saturations dropping one day.  His stress test was negative and he was cleared to return to rehab. He waited to hear back from office about straightening out his medications.  He hopes to return on Wednesday.

## 2017-04-13 DIAGNOSIS — Z955 Presence of coronary angioplasty implant and graft: Secondary | ICD-10-CM

## 2017-04-13 DIAGNOSIS — E785 Hyperlipidemia, unspecified: Secondary | ICD-10-CM | POA: Diagnosis not present

## 2017-04-13 DIAGNOSIS — J8410 Pulmonary fibrosis, unspecified: Secondary | ICD-10-CM | POA: Diagnosis not present

## 2017-04-13 DIAGNOSIS — Z7951 Long term (current) use of inhaled steroids: Secondary | ICD-10-CM | POA: Diagnosis not present

## 2017-04-13 DIAGNOSIS — Z7982 Long term (current) use of aspirin: Secondary | ICD-10-CM | POA: Diagnosis not present

## 2017-04-13 DIAGNOSIS — I2510 Atherosclerotic heart disease of native coronary artery without angina pectoris: Secondary | ICD-10-CM | POA: Diagnosis not present

## 2017-04-13 DIAGNOSIS — I10 Essential (primary) hypertension: Secondary | ICD-10-CM | POA: Diagnosis not present

## 2017-04-13 DIAGNOSIS — Z79899 Other long term (current) drug therapy: Secondary | ICD-10-CM | POA: Diagnosis not present

## 2017-04-13 DIAGNOSIS — G4730 Sleep apnea, unspecified: Secondary | ICD-10-CM | POA: Diagnosis not present

## 2017-04-13 NOTE — Progress Notes (Signed)
Daily Session Note  Patient Details  Name: Vincent Kennedy MRN: [id number] Date of Birth: 11/27/1932 Referring Provider:     Cardiac Rehab from 01/24/2017 in [facility] Cardiac and Pulmonary Rehab  Referring Provider  Arida, Muhammad MD      Encounter Date: 04/13/2017  Check In: Session Check In - 04/13/17 0741      Check-In   Location  [facility]-Cardiac & Pulmonary Rehab    Staff Present    RCP,RRT,BSRT;Carroll Enterkin, RN, BSN;Jessica Hawkins, MA, RCEP, CCRP, Exercise Physiologist    Supervising physician immediately available to respond to emergencies  See telemetry face sheet for immediately available ER MD    Medication changes reported      No    Fall or balance concerns reported     No    Warm-up and Cool-down  Performed on first and last piece of equipment    Resistance Training Performed  Yes    VAD Patient?  No      Pain Assessment   Currently in Pain?  No/denies          Social History   Tobacco Use  Smoking Status Never Smoker  Smokeless Tobacco Never Used    Goals Met:  Independence with exercise equipment Exercise tolerated well Personal goals reviewed No report of cardiac concerns or symptoms Strength training completed today  Goals Unmet:  Not Applicable  Comments: Pt able to follow exercise prescription today without complaint.  Will continue to monitor for progression.   Dr. Mark Miller is Medical Director for [facility] and [name]. 

## 2017-04-15 ENCOUNTER — Other Ambulatory Visit: Payer: Self-pay

## 2017-04-15 DIAGNOSIS — I2510 Atherosclerotic heart disease of native coronary artery without angina pectoris: Secondary | ICD-10-CM | POA: Diagnosis not present

## 2017-04-15 DIAGNOSIS — J8410 Pulmonary fibrosis, unspecified: Secondary | ICD-10-CM | POA: Diagnosis not present

## 2017-04-15 DIAGNOSIS — Z7951 Long term (current) use of inhaled steroids: Secondary | ICD-10-CM | POA: Diagnosis not present

## 2017-04-15 DIAGNOSIS — Z79899 Other long term (current) drug therapy: Secondary | ICD-10-CM | POA: Diagnosis not present

## 2017-04-15 DIAGNOSIS — Z955 Presence of coronary angioplasty implant and graft: Secondary | ICD-10-CM

## 2017-04-15 DIAGNOSIS — Z7982 Long term (current) use of aspirin: Secondary | ICD-10-CM | POA: Diagnosis not present

## 2017-04-15 DIAGNOSIS — I10 Essential (primary) hypertension: Secondary | ICD-10-CM | POA: Diagnosis not present

## 2017-04-15 DIAGNOSIS — E785 Hyperlipidemia, unspecified: Secondary | ICD-10-CM | POA: Diagnosis not present

## 2017-04-15 DIAGNOSIS — G4730 Sleep apnea, unspecified: Secondary | ICD-10-CM | POA: Diagnosis not present

## 2017-04-15 MED ORDER — FINASTERIDE 5 MG PO TABS: 5.0000 mg | ORAL_TABLET | Freq: Every day | ORAL | 3 refills | Status: DC

## 2017-04-15 MED ORDER — METOPROLOL TARTRATE 25 MG PO TABS: 25.0000 mg | ORAL_TABLET | Freq: Two times a day (BID) | ORAL | 3 refills | Status: DC

## 2017-04-15 MED ORDER — ROSUVASTATIN CALCIUM 40 MG PO TABS: ORAL_TABLET | ORAL | 3 refills | Status: DC

## 2017-04-15 NOTE — Progress Notes (Signed)
Daily Session Note  Patient Details  Name: Vincent Kennedy MRN: [id number] Date of Birth: 1933-01-24 Referring Provider:     Cardiac Rehab from 01/24/2017 in [facility] Cardiac and Pulmonary Rehab  Referring Provider  Kathlyn Sacramento MD      Encounter Date: 04/15/2017  Check In: Session Check In - 04/15/17 0850      Check-In   Location  [facility]-Cardiac & Pulmonary Rehab    Staff Present  [place], BS, [place];Nada Maclachlan, BA, ACSM CEP, Exercise Physiologist;Susanne Bice, RN, BSN, CCRP    Supervising physician immediately available to respond to emergencies  See telemetry face sheet for immediately available ER MD    Fall or balance concerns reported     No    Tobacco Cessation  No Change    Warm-up and Cool-down  Performed on first and last piece of equipment    Resistance Training Performed  Yes    VAD Patient?  No      Pain Assessment   Currently in Pain?  No/denies    Pain Score  0-No pain    Multiple Pain Sites  No        Exercise Prescription Changes - 04/14/17 1100      Response to Exercise   Blood Pressure (Admit)  124/62    Blood Pressure (Exercise)  134/64    Blood Pressure (Exit)  124/74    Heart Rate (Admit)  80 bpm    Heart Rate (Exercise)  101 bpm    Heart Rate (Exit)  78 bpm    Oxygen Saturation (Admit)  94 %    Oxygen Saturation (Exercise)  94 %    Oxygen Saturation (Exit)  95 %    Rating of Perceived Exertion (Exercise)  12    Symptoms  none    Duration  Continue with 45 min of aerobic exercise without signs/symptoms of physical distress.    Intensity  THRR unchanged      Progression   Progression  Continue to progress workloads to maintain intensity without signs/symptoms of physical distress.    Average METs  2.15      Resistance Training   Training Prescription  Yes    Weight  4 lbs    Reps  10-15      Interval Training   Interval Training  No      Oxygen   Oxygen  Continuous    Liters  6      NuStep   Level  6    Minutes  15    METs   2.1      Arm Ergometer   Level  2    Minutes  15    METs  2      Home Exercise Plan   Plans to continue exercise at  Home (comment) walking on treadmill    Frequency  Add 2 additional days to program exercise sessions.    Initial Home Exercises Provided  01/28/17       Social History   Tobacco Use  Smoking Status Never Smoker  Smokeless Tobacco Never Used    Goals Met:  Independence with exercise equipment Exercise tolerated well No report of cardiac concerns or symptoms Strength training completed today  Goals Unmet:  Not Applicable  Comments: Pt able to follow exercise prescription today without complaint.  Will continue to monitor for progression.    Dr. Emily Filbert is Medical Director for [place] and [name].

## 2017-04-18 ENCOUNTER — Encounter: Payer: Medicare Other | Admitting: *Deleted

## 2017-04-18 ENCOUNTER — Other Ambulatory Visit: Payer: Self-pay

## 2017-04-18 DIAGNOSIS — Z955 Presence of coronary angioplasty implant and graft: Secondary | ICD-10-CM

## 2017-04-18 DIAGNOSIS — I10 Essential (primary) hypertension: Secondary | ICD-10-CM | POA: Diagnosis not present

## 2017-04-18 DIAGNOSIS — G4730 Sleep apnea, unspecified: Secondary | ICD-10-CM | POA: Diagnosis not present

## 2017-04-18 DIAGNOSIS — Z7982 Long term (current) use of aspirin: Secondary | ICD-10-CM | POA: Diagnosis not present

## 2017-04-18 DIAGNOSIS — J8410 Pulmonary fibrosis, unspecified: Secondary | ICD-10-CM | POA: Diagnosis not present

## 2017-04-18 DIAGNOSIS — I2510 Atherosclerotic heart disease of native coronary artery without angina pectoris: Secondary | ICD-10-CM | POA: Diagnosis not present

## 2017-04-18 DIAGNOSIS — E785 Hyperlipidemia, unspecified: Secondary | ICD-10-CM | POA: Diagnosis not present

## 2017-04-18 DIAGNOSIS — Z79899 Other long term (current) drug therapy: Secondary | ICD-10-CM | POA: Diagnosis not present

## 2017-04-18 DIAGNOSIS — Z7951 Long term (current) use of inhaled steroids: Secondary | ICD-10-CM | POA: Diagnosis not present

## 2017-04-18 MED ORDER — ZOLPIDEM TARTRATE 10 MG PO TABS: 10.0000 mg | ORAL_TABLET | Freq: Every evening | ORAL | 1 refills | Status: DC | PRN

## 2017-04-18 MED ORDER — BUDESONIDE 0.5 MG/2ML IN SUSP: 0.5000 mg | Freq: Two times a day (BID) | RESPIRATORY_TRACT | 3 refills | Status: DC

## 2017-04-18 NOTE — Progress Notes (Signed)
Daily Session Note  Patient Details  Name: Vincent Kennedy MRN: [id number] Date of Birth: 04-03-1932 Referring Provider:     Cardiac Rehab from 01/24/2017 in [facility] Cardiac and Pulmonary Rehab  Referring Provider  Kathlyn Sacramento MD      Encounter Date: 04/18/2017  Check In: Session Check In - 04/18/17 0743      Check-In   Location  [facility]-Cardiac & Pulmonary Rehab    Staff Present  Alberteen Sam, MA, RCEP, CCRP, Exercise Physiologist;Kelly Amedeo Plenty, BS, ACSM CEP, Exercise Physiologist;Krista Frederico Hamman, RN BSN    Supervising physician immediately available to respond to emergencies  See telemetry face sheet for immediately available ER MD    Medication changes reported      No    Fall or balance concerns reported     No    Warm-up and Cool-down  Performed on first and last piece of equipment    Resistance Training Performed  Yes    VAD Patient?  No      Pain Assessment   Currently in Pain?  No/denies    Multiple Pain Sites  No          Social History   Tobacco Use  Smoking Status Never Smoker  Smokeless Tobacco Never Used    Goals Met:  Independence with exercise equipment Exercise tolerated well No report of cardiac concerns or symptoms Strength training completed today  Goals Unmet:  Not Applicable  Comments: Pt able to follow exercise prescription today without complaint.  Will continue to monitor for progression.  [place] Name 12/20/16 1634 01/24/17 1305 04/18/17 0847     6 Minute Walk   Phase  Initial  Initial  Discharge   Distance  760 feet  250 feet  430 feet   Distance % Change  -  -  72 %   Distance Feet Change  -  -  180 ft   Walk Time  4.4 minutes stopped test due to desaturations  1.63 minutes  2.68 minutes   # of Rest Breaks  1 at end of test  1 test stopped at 1:38 for desaturation  1 test stopped for fatigue and desaturation   MPH  1.96  1.74  1.79   METS  1.93  0.94  0.88   RPE  _0 Perceived Dyspnea   _1 VO2 Peak   6.76  3.29  3.07   Symptoms  Yes (comment)  Yes (comment) wearing concentrator on back  Yes (comment)   Comments  SOB  SOB  SOB, fatigue   Resting HR  73 bpm  77 bpm  85 bpm   Resting BP  126/64  148/84  144/64   Resting Oxygen Saturation   94 %  97 %  94 %   Exercise Oxygen Saturation  during 6 min walk  79 %  73 %  79 %   Max Ex. HR  119 bpm  122 bpm  95 bpm   Max Ex. BP  154/74  152/64  146/64   2 Minute Post BP  156/64 recheck 146/72  144/70  -     Interval HR   1 Minute HR  73  80  93   2 Minute HR  116  122 test stopped at 1:38  93   3 Minute HR  104  -  86   4 Minute HR  116  66  -  5 Minute HR  119 stopped at 4:24 due desaturation to 79%  -  -   2 Minute Post HR  80  -  -   Interval Heart Rate?  Yes  Yes  Yes     Interval Oxygen   Interval Oxygen?  Yes  Yes  Yes   Baseline Oxygen Saturation %  94 %  97 %  94 %   1 Minute Oxygen Saturation %  92 %  80 %  88 %   1 Minute Liters of Oxygen  4 L pulsed  4 L pulsed  4 L   2 Minute Oxygen Saturation %  83 %  73 % test stopped at 1:38 desaturation at 75%  80 % 2:41 seated 79%   2 Minute Liters of Oxygen  4 L  4 L  4 L   3 Minute Oxygen Saturation %  81 %  90 %  80 %   3 Minute Liters of Oxygen  4 L  4 L  6 L increased to 6L to recover   4 Minute Oxygen Saturation %  80 % 79% at 4:24  94 %  90 %   4 Minute Liters of Oxygen  4 L  4 L  6 L   2 Minute Post Oxygen Saturation %  92 %  -  -   2 Minute Post Liters of Oxygen  4 L  -  -       Dr. Emily Filbert is Medical Director for [place] and [name].

## 2017-04-20 ENCOUNTER — Encounter: Payer: Self-pay | Admitting: *Deleted

## 2017-04-20 DIAGNOSIS — Z955 Presence of coronary angioplasty implant and graft: Secondary | ICD-10-CM

## 2017-04-20 NOTE — Progress Notes (Signed)
Cardiac Individual Treatment Plan  Patient Details  Name: Vincent Kennedy MRN: [id number] Date of Birth: May 17, 1932 Referring Provider:     Cardiac Rehab from 01/24/2017 in [facility] Cardiac and Pulmonary Rehab  Referring Provider  Kathlyn Sacramento MD      Initial Encounter Date:    Cardiac Rehab from 01/24/2017 in [facility] Cardiac and Pulmonary Rehab  Date  01/24/17  Referring Provider  Kathlyn Sacramento MD      Visit Diagnosis: Status post coronary artery stent placement  Patient's Home Medications on Admission:  Current Outpatient Medications:  .  amLODipine (NORVASC) 2.5 MG tablet, Take 1 tablet (2.5 mg total) by mouth daily., Disp: 180 tablet, Rfl: 3 .  aspirin EC 81 MG tablet, Take 1 tablet (81 mg total) by mouth daily., Disp: 30 tablet, Rfl: 6 .  budesonide (PULMICORT) 0.5 MG/2ML nebulizer solution, Take 2 mLs (0.5 mg total) by nebulization 2 (two) times daily., Disp: 260 mL, Rfl: 3 .  chlorproMAZINE (THORAZINE) 25 MG tablet, Take 1 tablet (25 mg total) by mouth 3 (three) times daily as needed for hiccoughs., Disp: 60 tablet, Rfl: 0 .  clopidogrel (PLAVIX) 75 MG tablet, Take 1 tablet (75 mg total) by mouth daily., Disp: 30 tablet, Rfl: 6 .  finasteride (PROSCAR) 5 MG tablet, Take 1 tablet (5 mg total) by mouth daily., Disp: 90 tablet, Rfl: 3 .  metoprolol tartrate (LOPRESSOR) 25 MG tablet, Take 1 tablet (25 mg total) by mouth 2 (two) times daily., Disp: 180 tablet, Rfl: 3 .  mometasone-formoterol (DULERA) 100-5 MCG/ACT AERO, Inhale 2 puffs into the lungs 2 (two) times daily., Disp: 1 Inhaler, Rfl: 1 .  nitroGLYCERIN (NITROSTAT) 0.4 MG SL tablet, Place 1 tablet (0.4 mg total) under the tongue every 5 (five) minutes as needed for chest pain., Disp: 25 tablet, Rfl: 12 .  pantoprazole (PROTONIX) 40 MG tablet, Take 1 tablet (40 mg total) by mouth daily., Disp: 30 tablet, Rfl: 6 .  Pirfenidone (ESBRIET) 267 MG CAPS, Take 801 mg by mouth 3 (three) times daily. , Disp: , Rfl:  .  rivastigmine  (EXELON) 9.5 mg/24hr, Place 1 patch (9.5 mg total) onto the skin daily. (Patient taking differently: Place 9.5 mg onto the skin once a week. ), Disp: 30 patch, Rfl: 2 .  rosuvastatin (CRESTOR) 40 MG tablet, Take 40 mg by mouth every day, Disp: 90 tablet, Rfl: 3 .  zolpidem (AMBIEN) 10 MG tablet, Take 1 tablet (10 mg total) by mouth at bedtime as needed for sleep., Disp: 90 tablet, Rfl: 1  Past Medical History: Past Medical History:  Diagnosis Date  . Abnormal nuclear cardiac imaging test    Dr. Fletcher Anon  . BPH (benign prostatic hyperplasia)   . Coronary artery disease    Cardiac cath September 2008: 20% left main stenosis, 40% mid LAD stenosis, 99% ostial first diagonal stenosis in a large branch and 90% ostial disease in second diagonal but the vessel was very small, 20% mid RCA stenosis with normal ejection fraction. Successful angioplasty and drug-eluting stent placement to the ostial first diagonal with a 2.5 x 15 mm Xience stent  . GERD (gastroesophageal reflux disease)   . Hyperlipidemia   . Hypertension    BP has never been high(per pt).  Metoprolol is to keep BP low because of stent (afterload)  . On home oxygen therapy    "2-3L; 24/7" (01/19/2017)  . OSA on CPAP   . Oxygen deficiency   . Pneumonia 07/2016  . Pulmonary fibrosis ([place]) 08/26/2015  .  Shortness of breath dyspnea    with exertion  . Wears hearing aid    (sometimes)    Tobacco Use: Social History   Tobacco Use  Smoking Status Never Smoker  Smokeless Tobacco Never Used    Labs: Recent Review Flowsheet Data    Labs for ITP Cardiac and Pulmonary Rehab Latest Ref Rng & Units 01/12/2017 01/12/2017 01/12/2017 01/12/2017 03/11/2017   Cholestrol 100 - 199 mg/dL - - - - 131   LDLCALC 0 - 99 mg/dL - - - - 73   HDL >39 mg/dL - - - - 38(L)   Trlycerides 0 - 149 mg/dL - - - - 99   PHART 7.350 - 7.450 7.378 - - - -   PCO2ART 32.0 - 48.0 mmHg 41.3 - - - -   HCO3 20.0 - 28.0 mmol/L 24.3 23.8 24.1 25.3 -   TCO2 22 - 32  mmol/L '26 25 25 27 '$ -   ACIDBASEDEF 0.0 - 2.0 mmol/L 1.0 2.0 1.0 1.0 -   O2SAT % 98.0 77.0 76.0 64.0 -       Exercise Target Goals:    Exercise Program Goal: Individual exercise prescription set using results from initial 6 min walk test and THRR while considering  patient's activity barriers and safety.   Exercise Prescription Goal: Initial exercise prescription builds to 30-45 minutes a day of aerobic activity, 2-3 days per week.  Home exercise guidelines will be given to patient during program as part of exercise prescription that the participant will acknowledge.  Activity Barriers & Risk Stratification: Activity Barriers & Cardiac Risk Stratification - 01/24/17 1341      Activity Barriers & Cardiac Risk Stratification   Activity Barriers  Deconditioning;Shortness of Breath    Cardiac Risk Stratification  High       6 Minute Walk: 6 Minute Walk    Row Name 01/24/17 1305 04/18/17 0847       6 Minute Walk   Phase  Initial  Discharge    Distance  250 feet  430 feet    Distance % Change  -  72 %    Distance Feet Change  -  180 ft    Walk Time  1.63 minutes  2.68 minutes    # of Rest Breaks  1 test stopped at 1:38 for desaturation  1 test stopped for fatigue and desaturation    MPH  1.74  1.79    METS  0.94  0.88    RPE  13  15    Perceived Dyspnea   2  3    VO2 Peak  3.29  3.07    Symptoms  Yes (comment) wearing concentrator on back  Yes (comment)    Comments  SOB  SOB, fatigue    Resting HR  77 bpm  85 bpm    Resting BP  148/84  144/64    Resting Oxygen Saturation   97 %  94 %    Exercise Oxygen Saturation  during 6 min walk  73 %  79 %    Max Ex. HR  122 bpm  95 bpm    Max Ex. BP  152/64  146/64    2 Minute Post BP  144/70  -      Interval HR   1 Minute HR  80  93    2 Minute HR  122 test stopped at 1:38  93    3 Minute HR  -  86    4 Minute  HR  66  -    Interval Heart Rate?  Yes  Yes      Interval Oxygen   Interval Oxygen?  Yes  Yes    Baseline Oxygen  Saturation %  97 %  94 %    1 Minute Oxygen Saturation %  80 %  88 %    1 Minute Liters of Oxygen  4 L pulsed  4 L    2 Minute Oxygen Saturation %  73 % test stopped at 1:38 desaturation at 75%  80 % 2:41 seated 79%    2 Minute Liters of Oxygen  4 L  4 L    3 Minute Oxygen Saturation %  90 %  80 %    3 Minute Liters of Oxygen  4 L  6 L increased to 6L to recover    4 Minute Oxygen Saturation %  94 %  90 %    4 Minute Liters of Oxygen  4 L  6 L       Oxygen Initial Assessment: Oxygen Initial Assessment - 01/24/17 1304      Home Oxygen   Home Oxygen Device  Portable Concentrator;Home Concentrator;E-Tanks    Sleep Oxygen Prescription  CPAP    Liters per minute  2.5    Home Exercise Oxygen Prescription  Continuous    Liters per minute  4    Home at Rest Exercise Oxygen Prescription  Continuous    Liters per minute  3    Compliance with Home Oxygen Use  Yes      Initial 6 min Walk   Oxygen Used  Portable Concentrator;Pulsed    Liters per minute  4      Program Oxygen Prescription   Program Oxygen Prescription  Continuous    Liters per minute  4      Intervention   Short Term Goals  To learn and exhibit compliance with exercise, home and travel O2 prescription;To learn and understand importance of maintaining oxygen saturations>88%;To learn and understand importance of monitoring SPO2 with pulse oximeter and demonstrate accurate use of the pulse oximeter.;To learn and demonstrate proper pursed lip breathing techniques or other breathing techniques.    Long  Term Goals  Exhibits compliance with exercise, home and travel O2 prescription;Verbalizes importance of monitoring SPO2 with pulse oximeter and return demonstration;Maintenance of O2 saturations>88%;Exhibits proper breathing techniques, such as pursed lip breathing or other method taught during program session;Compliance with respiratory medication       Oxygen Re-Evaluation: Oxygen Re-Evaluation    Row Name 02/14/17 0905  03/07/17 0802 04/13/17 0753         Program Oxygen Prescription   Program Oxygen Prescription  Continuous  Continuous  Continuous     Liters per minute  '4  4  4     '$ Comments  6 Liters on the treadmill  6 Liters on the treadmill  6 Liters on the treadmill       Home Oxygen   Home Oxygen Device  Portable Concentrator;Home Concentrator;E-Tanks  Portable Concentrator;Home Concentrator;E-Tanks  Portable Concentrator;Home Concentrator;E-Tanks     Sleep Oxygen Prescription  CPAP  CPAP  CPAP     Liters per minute  2.'5  3  3     '$ Home Exercise Oxygen Prescription  Continuous  Continuous  Continuous     Liters per minute  '4  4  4     '$ Home at Rest Exercise Oxygen Prescription  Continuous  Continuous  Continuous  Liters per minute  '3  3  3     '$ Compliance with Home Oxygen Use  Yes  Yes  Yes       Goals/Expected Outcomes   Short Term Goals  To learn and demonstrate proper use of respiratory medications;To learn and understand importance of maintaining oxygen saturations>88%;To learn and exhibit compliance with exercise, home and travel O2 prescription;To learn and understand importance of monitoring SPO2 with pulse oximeter and demonstrate accurate use of the pulse oximeter.;To learn and demonstrate proper pursed lip breathing techniques or other breathing techniques.  To learn and demonstrate proper use of respiratory medications;To learn and understand importance of maintaining oxygen saturations>88%;To learn and exhibit compliance with exercise, home and travel O2 prescription;To learn and understand importance of monitoring SPO2 with pulse oximeter and demonstrate accurate use of the pulse oximeter.;To learn and demonstrate proper pursed lip breathing techniques or other breathing techniques.  To learn and demonstrate proper use of respiratory medications;To learn and understand importance of maintaining oxygen saturations>88%;To learn and exhibit compliance with exercise, home and travel O2  prescription;To learn and understand importance of monitoring SPO2 with pulse oximeter and demonstrate accurate use of the pulse oximeter.;To learn and demonstrate proper pursed lip breathing techniques or other breathing techniques.     Long  Term Goals  Compliance with respiratory medication;Demonstrates proper use of MDI's;Exhibits proper breathing techniques, such as pursed lip breathing or other method taught during program session;Maintenance of O2 saturations>88%;Verbalizes importance of monitoring SPO2 with pulse oximeter and return demonstration;Exhibits compliance with exercise, home and travel O2 prescription  Compliance with respiratory medication;Demonstrates proper use of MDI's;Exhibits proper breathing techniques, such as pursed lip breathing or other method taught during program session;Maintenance of O2 saturations>88%;Verbalizes importance of monitoring SPO2 with pulse oximeter and return demonstration;Exhibits compliance with exercise, home and travel O2 prescription  Compliance with respiratory medication;Demonstrates proper use of MDI's;Exhibits proper breathing techniques, such as pursed lip breathing or other method taught during program session;Maintenance of O2 saturations>88%;Verbalizes importance of monitoring SPO2 with pulse oximeter and return demonstration;Exhibits compliance with exercise, home and travel O2 prescription     Comments  Vincent Kennedy uses his home oxygen as prescribed. He states he uses a CPAP everynight but it has been getting too hot he says. Informed him that if the water is evaporating before he awakes the machine will get hot. Also told him that the humidifier setting is one of the settings that he can change himself. He states someone comes to his house every so often to check on his machine. Informed him to talk to his home care provider to make sure that his machine does not get too hot. Patient verbalizes understanding. He takes his nebulizer twice a day and uses  Dulera as prescribed.  Vincent Kennedy remains complaint with his oxygen therapy.   He contineus to use his nebulizer daily and uses his pursed lip breathing technique frequently.  He is planning to take his CPAP to Dr. Laurelyn Sickle office when he has appointment on Wednesday.  He says that he has been using the humidifier and home concentrator at night.    Vincent Kennedy states he has been using his CPAP and oxygen as prescribed. He is checking his oxygen at home, which runs about 93 percent at rest on 3 liters. His CPAP was making a noise in the past but has not had a chance to talk to Dr. [name] about getting his CPAP looked at..     Goals/Expected Outcomes  Short: make sure home CPAP is  used correctly. Long: uses CPAP independently.  Short: Talk with Dr. [name] about CPAP.  Long: Continue to be indepedently in home oxygen therapy and CPAP use.   Short: Talk with Dr. [name] about his CPAP making noise.  Long: Continue to be indepedently in home oxygen therapy and CPAP use.         Oxygen Discharge (Final Oxygen Re-Evaluation): Oxygen Re-Evaluation - 04/13/17 0753      Program Oxygen Prescription   Program Oxygen Prescription  Continuous    Liters per minute  4    Comments  6 Liters on the treadmill      Home Oxygen   Home Oxygen Device  Portable Concentrator;Home Concentrator;E-Tanks    Sleep Oxygen Prescription  CPAP    Liters per minute  3    Home Exercise Oxygen Prescription  Continuous    Liters per minute  4    Home at Rest Exercise Oxygen Prescription  Continuous    Liters per minute  3    Compliance with Home Oxygen Use  Yes      Goals/Expected Outcomes   Short Term Goals  To learn and demonstrate proper use of respiratory medications;To learn and understand importance of maintaining oxygen saturations>88%;To learn and exhibit compliance with exercise, home and travel O2 prescription;To learn and understand importance of monitoring SPO2 with pulse oximeter and demonstrate accurate use of the pulse oximeter.;To  learn and demonstrate proper pursed lip breathing techniques or other breathing techniques.    Long  Term Goals  Compliance with respiratory medication;Demonstrates proper use of MDI's;Exhibits proper breathing techniques, such as pursed lip breathing or other method taught during program session;Maintenance of O2 saturations>88%;Verbalizes importance of monitoring SPO2 with pulse oximeter and return demonstration;Exhibits compliance with exercise, home and travel O2 prescription    Comments  Vincent Kennedy states he has been using his CPAP and oxygen as prescribed. He is checking his oxygen at home, which runs about 93 percent at rest on 3 liters. His CPAP was making a noise in the past but has not had a chance to talk to Dr. [name] about getting his CPAP looked at..    Goals/Expected Outcomes  Short: Talk with Dr. [name] about his CPAP making noise.  Long: Continue to be indepedently in home oxygen therapy and CPAP use.        Initial Exercise Prescription: Initial Exercise Prescription - 01/24/17 1400      Date of Initial Exercise RX and Referring Provider   Date  01/24/17    Referring Provider  Kathlyn Sacramento MD      Oxygen   Oxygen  Continuous    Liters  4      Treadmill   MPH  1.5    Grade  0    Minutes  15    METs  2.15      NuStep   Level  1    SPM  80    Minutes  15    METs  1.7      Arm Ergometer   Level  1    RPM  25    Minutes  15    METs  1.7      Prescription Details   Frequency (times per week)  3    Duration  Progress to 45 minutes of aerobic exercise without signs/symptoms of physical distress      Intensity   THRR 40-80% of Max Heartrate  101-126    Ratings of Perceived Exertion  11-13  Perceived Dyspnea  0-4      Progression   Progression  Continue to progress workloads to maintain intensity without signs/symptoms of physical distress.      Resistance Training   Training Prescription  Yes    Weight  3 lbs    Reps  10-15       Perform Capillary Blood  Glucose checks as needed.  Exercise Prescription Changes: Exercise Prescription Changes    Row Name 01/24/17 1400 01/28/17 0700 02/01/17 1300 02/16/17 1500 03/01/17 1200     Response to Exercise   Blood Pressure (Admit)  148/84  -  134/72  132/70  122/68   Blood Pressure (Exercise)  152/64  -  134/72  130/82  144/66   Blood Pressure (Exit)  144/70  -  124/50  108/66  114/64   Heart Rate (Admit)  77 bpm  -  63 bpm  67 bpm  64 bpm   Heart Rate (Exercise)  122 bpm  -  103 bpm  109 bpm  104 bpm   Heart Rate (Exit)  66 bpm  -  60 bpm  65 bpm  70 bpm   Oxygen Saturation (Admit)  97 %  -  -  -  -   Oxygen Saturation (Exercise)  73 %  -  84 %  93 %  90 %   Oxygen Saturation (Exit)  90 %  -  -  -  93 %   Rating of Perceived Exertion (Exercise)  13  -  '12  11  13   '$ Perceived Dyspnea (Exercise)  2  -  2  -  -   Symptoms  none  -  none  none  none   Comments  walk test results  -  -  -  -   Duration  -  -  Continue with 45 min of aerobic exercise without signs/symptoms of physical distress.  Continue with 45 min of aerobic exercise without signs/symptoms of physical distress.  Continue with 45 min of aerobic exercise without signs/symptoms of physical distress.   Intensity  -  -  THRR unchanged  THRR unchanged  THRR unchanged     Progression   Progression  -  -  Continue to progress workloads to maintain intensity without signs/symptoms of physical distress.  Continue to progress workloads to maintain intensity without signs/symptoms of physical distress.  Continue to progress workloads to maintain intensity without signs/symptoms of physical distress.   Average METs  -  -  2.32  2.25  2.32     Resistance Training   Training Prescription  -  -  Yes  Yes  Yes   Weight  -  -  3 lbs  3 lbs  3 lbs   Reps  -  -  10-15  10-15  10-15     Interval Training   Interval Training  -  -  No  No  No     Oxygen   Oxygen  -  -  Continuous  Continuous  Continuous   Liters  -  -  6  4-6  4-6     Treadmill    MPH  -  -  1.5  1.5  1.5   Grade  -  -  0  0  0   Minutes  -  -  '15  15  15   '$ METs  -  -  2.15  2.15  2.15     NuStep  Level  -  -  '4  4  4   '$ Minutes  -  -  '15  15  15   '$ METs  -  -  2.9  2.6  2.7     Arm Ergometer   Level  -  -  '1  1  2   '$ Minutes  -  -  '15  15  15   '$ METs  -  -  1.9  2  2.1     Home Exercise Plan   Plans to continue exercise at  -  Home (comment) walking on treadmill  Home (comment) walking on treadmill  Home (comment) walking on treadmill  Home (comment) walking on treadmill   Frequency  -  Add 1 additional day to program exercise sessions.  Add 1 additional day to program exercise sessions.  Add 1 additional day to program exercise sessions.  Add 1 additional day to program exercise sessions.   Initial Home Exercises Provided  -  01/28/17  01/28/17  01/28/17  01/28/17   Row Name 03/16/17 1400 03/30/17 1500 04/14/17 1100         Response to Exercise   Blood Pressure (Admit)  122/70  132/64  124/62     Blood Pressure (Exercise)  140/58  146/82  134/64     Blood Pressure (Exit)  126/78  120/60  124/74     Heart Rate (Admit)  91 bpm  78 bpm  80 bpm     Heart Rate (Exercise)  123 bpm  120 bpm  101 bpm     Heart Rate (Exit)  88 bpm  84 bpm  78 bpm     Oxygen Saturation (Admit)  -  -  94 %     Oxygen Saturation (Exercise)  93 %  -  94 %     Oxygen Saturation (Exit)  97 %  -  95 %     Rating of Perceived Exertion (Exercise)  '13  12  12     '$ Symptoms  none  none  none     Duration  Continue with 45 min of aerobic exercise without signs/symptoms of physical distress.  Continue with 45 min of aerobic exercise without signs/symptoms of physical distress.  Continue with 45 min of aerobic exercise without signs/symptoms of physical distress.     Intensity  THRR unchanged  THRR unchanged  THRR unchanged       Progression   Progression  Continue to progress workloads to maintain intensity without signs/symptoms of physical distress.  Continue to progress workloads to  maintain intensity without signs/symptoms of physical distress.  Continue to progress workloads to maintain intensity without signs/symptoms of physical distress.     Average METs  2.08  2.35  2.15       Resistance Training   Training Prescription  Yes  Yes  Yes     Weight  3 lbs  4 lbs  4 lbs     Reps  10-15  10-15  10-15       Interval Training   Interval Training  No  No  No       Oxygen   Oxygen  Continuous  Continuous  Continuous     Liters  4-6  4-6  6       Treadmill   MPH  1.5  1.5  -     Grade  0  0  -     Minutes  15  1  -  METs  2.15  2.15  -       NuStep   Level  '6  6  6     '$ Minutes  '15  15  15     '$ METs  2.2  2.8  2.1       Arm Ergometer   Level  '2  2  2     '$ Minutes  '15  15  15     '$ METs  1.9  2.1  2       Home Exercise Plan   Plans to continue exercise at  Home (comment) walking on treadmill  Home (comment) walking on treadmill  Home (comment) walking on treadmill     Frequency  Add 2 additional days to program exercise sessions.  Add 2 additional days to program exercise sessions.  Add 2 additional days to program exercise sessions.     Initial Home Exercises Provided  01/28/17  01/28/17  01/28/17        Exercise Comments: Exercise Comments    Row Name 01/26/17 0734           Exercise Comments  First full day of exercise!  Patient was oriented to gym and equipment including functions, settings, policies, and procedures.  Patient's individual exercise prescription and treatment plan were reviewed.  All starting workloads were established based on the results of the 6 minute walk test done at initial orientation visit.  The plan for exercise progression was also introduced and progression will be customized based on patient's performance and goals.          Exercise Goals and Review: Exercise Goals    Row Name 01/24/17 1402             Exercise Goals   Increase Physical Activity  Yes       Intervention  Provide advice, education, support and  counseling about physical activity/exercise needs.;Develop an individualized exercise prescription for aerobic and resistive training based on initial evaluation findings, risk stratification, comorbidities and participant's personal goals.       Expected Outcomes  Achievement of increased cardiorespiratory fitness and enhanced flexibility, muscular endurance and strength shown through measurements of functional capacity and personal statement of participant.       Increase Strength and Stamina  Yes       Intervention  Provide advice, education, support and counseling about physical activity/exercise needs.;Develop an individualized exercise prescription for aerobic and resistive training based on initial evaluation findings, risk stratification, comorbidities and participant's personal goals.       Expected Outcomes  Achievement of increased cardiorespiratory fitness and enhanced flexibility, muscular endurance and strength shown through measurements of functional capacity and personal statement of participant.       Able to understand and use rate of perceived exertion (RPE) scale  Yes       Intervention  Provide education and explanation on how to use RPE scale       Expected Outcomes  Short Term: Able to use RPE daily in rehab to express subjective intensity level;Long Term:  Able to use RPE to guide intensity level when exercising independently       Able to understand and use Dyspnea scale  Yes       Intervention  Provide education and explanation on how to use Dyspnea scale       Expected Outcomes  Short Term: Able to use Dyspnea scale daily in rehab to express subjective sense of shortness of breath during exertion;Long Term: Able  to use Dyspnea scale to guide intensity level when exercising independently       Knowledge and understanding of Target Heart Rate Range (THRR)  Yes       Intervention  Provide education and explanation of THRR including how the numbers were predicted and where they are  located for reference       Expected Outcomes  Short Term: Able to state/look up THRR;Long Term: Able to use THRR to govern intensity when exercising independently;Short Term: Able to use daily as guideline for intensity in rehab       Able to check pulse independently  Yes       Intervention  Provide education and demonstration on how to check pulse in carotid and radial arteries.;Review the importance of being able to check your own pulse for safety during independent exercise       Expected Outcomes  Long Term: Able to check pulse independently and accurately;Short Term: Able to explain why pulse checking is important during independent exercise       Understanding of Exercise Prescription  Yes       Intervention  Provide education, explanation, and written materials on patient's individual exercise prescription       Expected Outcomes  Short Term: Able to explain program exercise prescription;Long Term: Able to explain home exercise prescription to exercise independently          Exercise Goals Re-Evaluation : Exercise Goals Re-Evaluation    Row Name 01/26/17 0734 01/28/17 0757 02/01/17 1324 02/16/17 1510 03/01/17 1241     Exercise Goal Re-Evaluation   Exercise Goals Review  Understanding of Exercise Prescription;Knowledge and understanding of Target Heart Rate Range (THRR);Able to understand and use rate of perceived exertion (RPE) scale  Increase Physical Activity;Able to understand and use Dyspnea scale;Understanding of Exercise Prescription;Increase Strength and Stamina;Knowledge and understanding of Target Heart Rate Range (THRR);Able to understand and use rate of perceived exertion (RPE) scale;Able to check pulse independently  Increase Physical Activity;Increase Strength and Stamina;Understanding of Exercise Prescription  Increase Physical Activity;Increase Strength and Stamina;Understanding of Exercise Prescription  Increase Physical Activity;Increase Strength and Stamina;Understanding of  Exercise Prescription   Comments  Reviewed RPE scale, THR and program prescription with pt today.  Pt voiced understanding and was given a copy of goals to take home.   Reviewed home exercise with pt today.  Pt plans to walk on treadmill at home for exercise.  Reviewed THR, pulse, RPE, sign and symptoms, NTG use, and when to call 911 or MD.  Also discussed weather considerations and indoor options.  Pt voiced understanding.  Vincent Kennedy has made a smooth transition from Pulmonary to Cardiac Rehab.  He continues to desaturate on the treadmill, but we did get clearance to increase his oxygen to 6L on the treadmill and for exercise at home.  We will continue to monitor his progression.   Vincent Kennedy has continud to do well in Cardiac Rehab.  He has been doing better on the treadmill at 6L versus 4L.  He is now up to 2.6 METs on the NuStep. We will continue to monitor his progression.   Vincent Kennedy has been doing well in rehab.  His saturations have been staying abover 90%!  He continues to need prompting to keep moving from piece to piece.  We will continue to monitor his progress.   Expected Outcomes  Short: Use RPE daily to regulate intensity.  Long: Follow program prescription in THR.  Short: Add in one to two days of walking on  treadmill at home.  Long: Make exercise part of regular routine  Short: Try 6L of oxygen on the treadmill and monitor saturations.  Long: Continue to try to exercise more.   Short: Continue to work on [place].  Long: Try to increase physical activity overall.   Short: Continue to increas workloads. Long: Continue to increase physcial activity.   [place]as Name 03/07/17 0751 03/16/17 1449 03/30/17 1516 04/14/17 1112 04/18/17 0851     Exercise Goal Re-Evaluation   Exercise Goals Review  Increase Physical Activity;Increase Strength and Stamina;Understanding of Exercise Prescription  -  Increase Physical Activity;Increase Strength and Stamina;Understanding of Exercise Prescription  Increase Physical  Activity;Increase Strength and Stamina;Understanding of Exercise Prescription  Increase Physical Activity;Increase Strength and Stamina;Understanding of Exercise Prescription   Comments  Vincent Kennedy has been doing well in rehab. He is starting to feel a little stronger.  He is using his treadmill at home as well as a recumbent bike for about 15 min each for a total of 23mn on the days he is not here.    Vincent Kennedy has continued to do well in rehab.  He continues to get in his home exercise regularly.  He is up to level 6 on the NuStep.  We will continue to monitor his progression.   Vincent Kennedy has been doing well in rehab.  He is now using 4 lbs weights and maintaining his step count on the NuStep.  We will continue to monitor his progress.   Vincent Kennedy returned to rehab yesterday after being out with his stress test.  He was able to return without any problems and his saturations were good. We will continue to monitor his progress. 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   Improved walk test by 72%!!   Expected Outcomes  Short: Continue to work on increasing workloads to improve stamina.  Long: Continue to exercise at home.  Short: Continue to work on improving stamina and strength.  Long: Continue to increase physical activity.   Short: Continue to try to increase treadmill if saturations can stay >90%.  Long: Continue to build strength and stamina.   Short: Improve 6MWT!  Long: Continue to exercise regularly.   -      Discharge Exercise Prescription (Final Exercise Prescription Changes): Exercise Prescription Changes - 04/14/17 1100      Response to Exercise   Blood Pressure (Admit)  124/62    Blood Pressure (Exercise)  134/64    Blood Pressure (Exit)  124/74    Heart Rate (Admit)  80 bpm    Heart Rate (Exercise)  101 bpm    Heart Rate (Exit)  78 bpm    Oxygen Saturation (Admit)  94 %    Oxygen Saturation (Exercise)  94 %    Oxygen Saturation (Exit)  95 %    Rating of Perceived Exertion (Exercise)   12    Symptoms  none    Duration  Continue with 45 min of aerobic exercise without signs/symptoms of physical distress.    Intensity  THRR unchanged      Progression   Progression  Continue to progress workloads to maintain intensity without signs/symptoms of physical distress.    Average METs  2.15      Resistance Training   Training Prescription  Yes    Weight  4 lbs    Reps  10-15      Interval Training   Interval Training  No      Oxygen  Oxygen  Continuous    Liters  6      NuStep   Level  6    Minutes  15    METs  2.1      Arm Ergometer   Level  2    Minutes  15    METs  2      Home Exercise Plan   Plans to continue exercise at  Home (comment) walking on treadmill    Frequency  Add 2 additional days to program exercise sessions.    Initial Home Exercises Provided  01/28/17       Nutrition:  Target Goals: Understanding of nutrition guidelines, daily intake of sodium '1500mg'$ , cholesterol '200mg'$ , calories 30% from fat and 7% or less from saturated fats, daily to have 5 or more servings of fruits and vegetables.  Biometrics: Pre Biometrics - 01/24/17 1403      Pre Biometrics   Height  5' 6.1" (1.679 m)    Weight  155 lb 9.6 oz (70.6 kg)    Waist Circumference  37.5 inches    Hip Circumference  36 inches    Waist to Hip Ratio  1.04 %    BMI (Calculated)  25.04    Single Leg Stand  18.09 seconds        Nutrition Therapy Plan and Nutrition Goals: Nutrition Therapy & Goals - 01/24/17 1337      Intervention Plan   Intervention  Prescribe, educate and counsel regarding individualized specific dietary modifications aiming towards targeted core components such as weight, hypertension, lipid management, diabetes, heart failure and other comorbidities.;Nutrition handout(s) given to patient.    Expected Outcomes  Short Term Goal: Understand basic principles of dietary content, such as calories, fat, sodium, cholesterol and nutrients.;Short Term Goal: A plan has  been developed with personal nutrition goals set during dietitian appointment.;Long Term Goal: Adherence to prescribed nutrition plan.       Nutrition Assessments: Nutrition Assessments - 01/24/17 1338      MEDFICTS Scores   Pre Score  -- will bring in to first session       Nutrition Goals Re-Evaluation: Nutrition Goals Re-Evaluation    [place] Name 02/14/17 0914 03/07/17 0756 04/13/17 0800         Goals   Current Weight  155 lb 11.2 oz (70.6 kg)  155 lb (70.3 kg)  154 lb (69.9 kg)     Nutrition Goal  Maintain weight and intake more calories to improve strength.  Meet with dietician to discuss maintain weight and intake more calories to improve strength.    He wants to meet with the dietician still since he was not able to meet with her.     Comment  Vincent Kennedy shows an interest in meeting with the dietician. Dietician Lattie Haw spoke to him about meeting next week. He states he is not as hungry as he used to be. He says he eats 2 meals a day. At dinner he eats alot of fruit. He does not get short of breath while eating.  Vincent Kennedy would like to meet with dietician while in program.  Last week the weather kept him out but he would like to talk with her while here in class.   He just would like to get a few tweaks.   He states his eating is fair. His appitite is ok and is eating a heart healthy diet. Dietician appointment made for 04/25/17     Expected Outcome  Short: meet with the dietician. Long: Adhere  to a diet plan to increase calorie intake.  Short: Meet with dietician.  Long: Follow recommendations.   Short: Meet with dietician.  Long: Follow recommendations for a diet plan.         Nutrition Goals Discharge (Final Nutrition Goals Re-Evaluation): Nutrition Goals Re-Evaluation - 04/13/17 0800      Goals   Current Weight  154 lb (69.9 kg)    Nutrition Goal  He wants to meet with the dietician still since he was not able to meet with her.    Comment  He states his eating is fair. His appitite is ok  and is eating a heart healthy diet. Dietician appointment made for 04/25/17    Expected Outcome  Short: Meet with dietician.  Long: Follow recommendations for a diet plan.        Psychosocial: Target Goals: Acknowledge presence or absence of significant depression and/or stress, maximize coping skills, provide positive support system. Participant is able to verbalize types and ability to use techniques and skills needed for reducing stress and depression.   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 He feels he can not do simple things without being SOB or tired.       Family Dynamics   Good Support System?  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 His wife was with him today at his appointment and is supportive of him beginning the cardiac rehab program.       Barriers   Psychosocial barriers to participate in program  The patient should benefit from training in stress management and relaxation.      Screening Interventions   Interventions  Yes;Encouraged to exercise;Program counselor consult;To provide support and resources with identified psychosocial needs;Provide feedback about the scores to participant    Expected Outcomes  Short Term goal: Utilizing psychosocial counselor, staff and physician to assist with identification of specific Stressors or current issues interfering with healing process. Setting desired goal for each stressor or current issue identified.;Long Term Goal: Stressors or current issues are controlled or eliminated.;Short Term goal: Identification and review with  participant of any Quality of Life or Depression concerns found by scoring the questionnaire.;Long Term goal: The participant improves quality of Life and PHQ9 Scores as seen by post scores and/or verbalization of changes       Quality of Life Scores:  Quality of Life - 01/24/17 1340      Quality of Life Scores   Health/Function Pre  17.11 %    Socioeconomic Pre  26 %    Psych/Spiritual Pre  18.57 %    Family Pre  29 %    GLOBAL Pre  21.11 %      Scores of 19 and below usually indicate a poorer quality of life in these areas.  A difference of  2-3 points is a clinically meaningful difference.  A difference of 2-3 points in the total score of the Quality of Life Index has been associated with significant improvement in overall quality of life, self-image, physical symptoms, and general health in studies assessing  change in quality of life.  PHQ-9: Recent Review Flowsheet Data    Depression screen [facility] 2/9 01/26/2017 01/24/2017 12/20/2016 04/26/2016 12/24/2015   Decreased Interest '1 1 2 '$ 0 0   Down, Depressed, Hopeless '1 1 1 '$ 0 0   PHQ - 2 Score '2 2 3 '$ 0 0   Altered sleeping 0 3 0 - -   Tired, decreased energy '1 1 1 '$ - -   Change in appetite '2 3 1 '$ - -   Feeling bad or failure about yourself  0 3 1 - -   Trouble concentrating 0 0 0 - -   Moving slowly or fidgety/restless 0 0 0 - -   Suicidal thoughts 0 0 0 - -   PHQ-9 Score '5 12 6 '$ - -   Difficult doing work/chores Somewhat difficult Somewhat difficult Somewhat difficult - -     Interpretation of Total Score  Total Score Depression Severity:  1-4 = Minimal depression, 5-9 = Mild depression, 10-14 = Moderate depression, 15-19 = Moderately severe depression, 20-27 = Severe depression   Psychosocial Evaluation and Intervention: Psychosocial Evaluation - 01/26/17 0958      Psychosocial Evaluation & Interventions   Interventions  Encouraged to exercise with the program and follow exercise prescription    Comments  Counselor met with Mr.  Rogue Jury today for initial psychosocial evaluation.  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  He has a strong support system with a spouse of 68 years and close friends.   He is also actively involved in his local faith community.  Dr. B reports that he sleeps well typically and has a good appetite.  He is generally in a positive mood and denies a history or current symptoms of depression or anxiety.  His PHQ-9 score was a "12" indicating moderate depression.  However, this counselor reviewed the individual items with him reporting "misunderstanding the values of the questions."  His revised score is a "5" indicating some mild symptoms are present - mostly relating to his energy level and limitations.  Dr. B states that his health is his primary stressor currently.  He has goals to get off of his Oxygen as much and increase his stamina and strength while in this program.  He plans to purchase some home equipment to help exercise more consistently.  Counselor will continue to follow with Mr. Rogue Jury.     Expected Outcomes  Dr. B will benefit from consistent exercise to achieve his stated goals.  The educational and psychoeducational components will be helpful in understanding and coping with his health condition.      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 He says other than his rental properties his stress levels are low. His wife is very supportive of his exercise program.  Vincent Kennedy has been doing pretty good. He has opened up a little more being in rehab.  His biggest stressor continues to be his  rental properties but he takes his time with them so they don't build up on him too much.  He is finding that he is getting out and about more, but is limited by the battery life in his POC.  He continues a strong support in his wife.  He has found rehab to be helpful and the exercise seems to be helping.   He went to [place] when he was out, did his taxes. He saw his son. He has not been doing his home exercise since he has been out. He has been feeling short of breath and was taking it easy due to the procedures he had  while he has been away from [facility]. He states that he does not have much stress at the moment. He does not want to stay for stress management education.      Expected Outcomes  Short: continue to attend [facility]. Long: Maintain exercise to decrease stress.  Short: Continuet to stay postivie and keep stress low.  Long: Continue to manage disease process with postive attitude.   Short: Continuet to attend [facility] to keep stress low.  Long: Continue to exercise to decrease stress.     Interventions  Encouraged to attend Cardiac Rehabilitation for the exercise  Encouraged to attend Cardiac Rehabilitation for the exercise;Stress management education  Encouraged to attend Cardiac Rehabilitation for the exercise     Continue Psychosocial Services   Follow up required by staff  Follow up required by staff  Follow up required by staff       Initial Review   Source of Stress Concerns  -  Chronic Illness;Unable to perform yard/household activities  -        Psychosocial Discharge (Final Psychosocial Re-Evaluation): Psychosocial Re-Evaluation - 04/13/17 0810      Psychosocial Re-Evaluation   Current issues with  Current Stress Concerns    Comments  He went to [facility] when he was out, did his taxes. He saw his son. He has not been doing his home exercise since he has been out. He has been feeling short of breath and was taking it easy due to the procedures he had  while he has been  away from [facility]. He states that he does not have much stress at the moment. He does not want to stay for stress management education.     Expected Outcomes  Short: Continuet to attend [facility] to keep stress low.  Long: Continue to exercise to decrease stress.    Interventions  Encouraged to attend Cardiac Rehabilitation for the exercise    Continue Psychosocial Services   Follow up required by staff       Vocational Rehabilitation: Provide vocational rehab assistance to qualifying candidates.   Vocational Rehab Evaluation & Intervention: Vocational Rehab - 01/24/17 1303      Initial Vocational Rehab Evaluation & Intervention   Assessment shows need for Vocational Rehabilitation  No       Education: Education Goals: Education classes will be provided on a variety of topics geared toward better understanding of heart health and risk factor modification. Participant will state understanding/return demonstration of topics presented as noted by education test scores.  Learning Barriers/Preferences: Learning Barriers/Preferences - 01/24/17 1302      Learning Barriers/Preferences   Learning Barriers  Hearing has hearing aid but does not wear them often    Learning Preferences  Audio;Written Material       Education Topics:  AED/CPR: - Group verbal and written instruction with the use of models to demonstrate the basic use of the AED with the basic ABC's of resuscitation.   Pulmonary Rehab  from 07/28/2015 in [facility] Cardiac and Pulmonary Rehab  Date  05/23/15  Educator  CE  Instruction Review Code (retired)  2- meets goals/outcomes      General Nutrition Guidelines/Fats and Fiber: -Group instruction provided by verbal, written material, models and posters to present the general guidelines for heart healthy nutrition. Gives an explanation and review of dietary fats and fiber.   Cardiac Rehab from 04/18/2017 in [facility] Cardiac and Pulmonary Rehab  Date  04/18/17  Educator  CR   Instruction Review Code  1- Verbalizes Understanding      Controlling Sodium/Reading Food Labels: -Group verbal and written material supporting the discussion of sodium use in heart healthy nutrition. Review and explanation with models, verbal and written materials for utilization of the food label.   Cardiac Rehab from 04/18/2017 in [facility] Cardiac and Pulmonary Rehab  Date  03/07/17  Educator  CR  Instruction Review Code  1- Verbalizes Understanding      Exercise Physiology & General Exercise Guidelines: - Group verbal and written instruction with models to review the exercise physiology of the cardiovascular system and associated critical values. Provides general exercise guidelines with specific guidelines to those with heart or lung disease.    Cardiac Rehab from 04/18/2017 in [facility] Cardiac and Pulmonary Rehab  Date  03/16/17  Educator  [facility]  Instruction Review Code  1- Verbalizes Understanding      Aerobic Exercise & Resistance Training: - Gives group verbal and written instruction on the various components of exercise. Focuses on aerobic and resistive training programs and the benefits of this training and how to safely progress through these programs..   Cardiac Rehab from 04/18/2017 in [facility] Cardiac and Pulmonary Rehab  Date  03/23/17  Educator  [facility]  Instruction Review Code  1- Verbalizes Understanding      Flexibility, Balance, Mind/Body Relaxation: Provides group verbal/written instruction on the benefits of flexibility and balance training, including mind/body exercise modes such as yoga, pilates and tai chi.  Demonstration and skill practice provided.   Pulmonary Rehab from 07/28/2015 in [facility] Cardiac and Pulmonary Rehab  Date  05/07/15  Educator  RM  Instruction Review Code (retired)  2- meets goals/outcomes      Stress and Anxiety: - Provides group verbal and written instruction about the health risks of elevated stress and causes of high stress.  Discuss the correlation  between heart/lung disease and anxiety and treatment options. Review healthy ways to manage with stress and anxiety.   Cardiac Rehab from 04/18/2017 in [facility] Cardiac and Pulmonary Rehab  Date  04/13/17  Educator  [facility]  Instruction Review Code  1- Verbalizes Understanding      Depression: - Provides group verbal and written instruction on the correlation between heart/lung disease and depressed mood, treatment options, and the stigmas associated with seeking treatment.   Anatomy & Physiology of the Heart: - Group verbal and written instruction and models provide basic cardiac anatomy and physiology, with the coronary electrical and arterial systems. Review of Valvular disease and Heart Failure   Cardiac Rehab from 04/18/2017 in [facility] Cardiac and Pulmonary Rehab  Date  01/31/17  Educator  CE  Instruction Review Code  1- Verbalizes Understanding      Cardiac Procedures: - Group verbal and written instruction to review commonly prescribed medications for heart disease. Reviews the medication, class of the drug, and side effects. Includes the steps to properly store meds and maintain the prescription regimen. (beta blockers and nitrates)   Cardiac Medications I: - Group  verbal and written instruction to review commonly prescribed medications for heart disease. Reviews the medication, class of the drug, and side effects. Includes the steps to properly store meds and maintain the prescription regimen.   Cardiac Rehab from 04/18/2017 in [facility] Cardiac and Pulmonary Rehab  Date  02/14/17  Educator  CE  Instruction Review Code  1- Verbalizes Understanding      Cardiac Medications II: -Group verbal and written instruction to review commonly prescribed medications for heart disease. Reviews the medication, class of the drug, and side effects. (all other drug classes)   Pulmonary Rehab from 01/10/2017 in [facility] Cardiac and Pulmonary Rehab  Date  12/31/16  Educator  [facility]  Instruction Review Code  1-  Verbalizes Understanding       Go Sex-Intimacy & Heart Disease, Get SMART - Goal Setting: - Group verbal and written instruction through game format to discuss heart disease and the return to sexual intimacy. Provides group verbal and written material to discuss and apply goal setting through the application of the S.M.A.R.T. Method.   Other Matters of the Heart: - Provides group verbal, written materials and models to describe Stable Angina and Peripheral Artery. Includes description of the disease process and treatment options available to the cardiac patient.   Cardiac Rehab from 04/18/2017 in [facility] Cardiac and Pulmonary Rehab  Date  01/31/17  Educator  CE  Instruction Review Code  1- Verbalizes Understanding      Exercise & Equipment Safety: - Individual verbal instruction and demonstration of equipment use and safety with use of the equipment.   Cardiac Rehab from 04/18/2017 in [facility] Cardiac and Pulmonary Rehab  Date  01/24/17  Educator  [place]  Instruction Review Code  1- Verbalizes Understanding      Infection Prevention: - Provides verbal and written material to individual with discussion of infection control including proper hand washing and proper equipment cleaning during exercise session.   Cardiac Rehab from 04/18/2017 in [facility] Cardiac and Pulmonary Rehab  Date  01/24/17  Educator  [place]  Instruction Review Code  1- Verbalizes Understanding      Falls Prevention: - Provides verbal and written material to individual with discussion of falls prevention and safety.   Cardiac Rehab from 04/18/2017 in [facility] Cardiac and Pulmonary Rehab  Date  01/24/17  Educator  KS  Instruction Review Code  1- Verbalizes Understanding      Diabetes: - Individual verbal and written instruction to review signs/symptoms of diabetes, desired ranges of glucose level fasting, after meals and with exercise. Acknowledge that pre and post exercise glucose checks will be done for 3 sessions at entry of  program.   Know Your Numbers and Risk Factors: -Group verbal and written instruction about important numbers in your health.  Discussion of what are risk factors and how they play a role in the disease process.  Review of Cholesterol, Blood Pressure, Diabetes, and BMI and the role they play in your overall health.   Pulmonary Rehab from 01/10/2017 in [facility] Cardiac and Pulmonary Rehab  Date  12/31/16  Educator  [facility]  Instruction Review Code  1- Verbalizes Understanding      Sleep Hygiene: -Provides group verbal and written instruction about how sleep can affect your health.  Define sleep hygiene, discuss sleep cycles and impact of sleep habits. Review good sleep hygiene tips.    Other: -Provides group and verbal instruction on various topics (see comments)   Knowledge Questionnaire Score: Knowledge Questionnaire Score - 01/24/17 1353  Knowledge Questionnaire Score   Pre Score  24/28 Reviewed correct answeres with patient who verbalied understanding.       Core Components/Risk Factors/Patient Goals at Admission: Personal Goals and Risk Factors at Admission - 01/24/17 1335      Core Components/Risk Factors/Patient Goals on Admission    Weight Management  Yes;Weight Maintenance    Intervention  Weight Management: Develop a combined nutrition and exercise program designed to reach desired caloric intake, while maintaining appropriate intake of nutrient and fiber, sodium and fats, and appropriate energy expenditure required for the weight goal.;Weight Management: Provide education and appropriate resources to help participant work on and attain dietary goals.;Weight Management/Obesity: Establish reasonable short term and long term weight goals.    Admit Weight  155 lb 9.6 oz (70.6 kg)    Goal Weight: Short Term  150 lb (68 kg)    Goal Weight: Long Term  150 lb (68 kg)    Expected Outcomes  Weight Maintenance: Understanding of the daily nutrition guidelines, which includes 25-35%  calories from fat, 7% or less cal from saturated fats, less than '200mg'$  cholesterol, less than 1.5gm of sodium, & 5 or more servings of fruits and vegetables daily;Long Term: Adherence to nutrition and physical activity/exercise program aimed toward attainment of established weight goal;Short Term: Continue to assess and modify interventions until short term weight is achieved;Understanding recommendations for meals to include 15-35% energy as protein, 25-35% energy from fat, 35-60% energy from carbohydrates, less than '200mg'$  of dietary cholesterol, 20-35 gm of total fiber daily;Understanding of distribution of calorie intake throughout the day with the consumption of 4-5 meals/snacks    Improve shortness of breath with ADL's  Yes Patient would like to be able to walk and do things around the house without getting so short of breath    Intervention  Provide education, individualized exercise plan and daily activity instruction to help decrease symptoms of SOB with activities of daily living.    Expected Outcomes  Short Term: Achieves a reduction of symptoms when performing activities of daily living.    Develop more efficient breathing techniques such as purse lipped breathing and diaphragmatic breathing; and practicing self-pacing with activity  Yes    Intervention  Provide education, demonstration and support about specific breathing techniuqes utilized for more efficient breathing. Include techniques such as pursed lipped breathing, diaphragmatic breathing and self-pacing activity.    Expected Outcomes  Short Term: Participant will be able to demonstrate and use breathing techniques as needed throughout daily activities.    Increase knowledge of respiratory medications and ability to use respiratory devices properly   Yes    Intervention  Provide education and demonstration as needed of appropriate use of medications, inhalers, and oxygen therapy.    Expected Outcomes  Short Term: Achieves understanding of  medications use. Understands that oxygen is a medication prescribed by physician. Demonstrates appropriate use of inhaler and oxygen therapy.    Hypertension  Yes    Intervention  Provide education on lifestyle modifcations including regular physical activity/exercise, weight management, moderate sodium restriction and increased consumption of fresh fruit, vegetables, and low fat dairy, alcohol moderation, and smoking cessation.;Monitor prescription use compliance.    Expected Outcomes  Short Term: Continued assessment and intervention until BP is < 140/51m HG in hypertensive participants. < 130/840mHG in hypertensive participants with diabetes, heart failure or chronic kidney disease.;Long Term: Maintenance of blood pressure at goal levels.    Lipids  Yes    Intervention  Provide education and support  for participant on nutrition & aerobic/resistive exercise along with prescribed medications to achieve LDL '70mg'$ , HDL >'40mg'$ .    Expected Outcomes  Short Term: Participant states understanding of desired cholesterol values and is compliant with medications prescribed. Participant is following exercise prescription and nutrition guidelines.;Long Term: Cholesterol controlled with medications as prescribed, with individualized exercise RX and with personalized nutrition plan. Value goals: LDL < '70mg'$ , HDL > 40 mg.    Stress  Yes    Intervention  Offer individual and/or small group education and counseling on adjustment to heart disease, stress management and health-related lifestyle change. Teach and support self-help strategies.;Refer participants experiencing significant psychosocial distress to appropriate mental health specialists for further evaluation and treatment. When possible, include family members and significant others in education/counseling sessions.    Expected Outcomes  Short Term: Participant demonstrates changes in health-related behavior, relaxation and other stress management skills, ability  to obtain effective social support, and compliance with psychotropic medications if prescribed.;Long Term: Emotional wellbeing is indicated by absence of clinically significant psychosocial distress or social isolation.       Core Components/Risk Factors/Patient Goals Review:  Goals and Risk Factor Review    Row Name 02/16/17 1513 03/07/17 0753 04/13/17 0747         Core Components/Risk Factors/Patient Goals Review   Personal Goals Review  Hypertension;Improve shortness of breath with ADL's;Stress;Weight Management/Obesity;Lipids  Hypertension;Improve shortness of breath with ADL's;Weight Management/Obesity;Lipids  Hypertension;Improve shortness of breath with ADL's;Weight Management/Obesity;Lipids     Review  Vincent Kennedy states that his mood has been better since he has more energy. Being in [place] has made him more talkitive. When he walks from one end of the house to the other he still gets short of breath but not as bad as it was a few months ago.  Vincent Kennedy's weight has been steady at 155lbs.  He continues to have more energy.  Blood pressures have been good around 120s/70s and he continues to check them at home. He does not feel that his shortness of breath is getting much better as he is still using 3L at rest and 4L for exercise.  He is able to do a little more, but usually finds that the battery life of his POC is limiting the amount of his physcial activity.   His shortness of breath has been feeling worse since he has been out for a little while. His blood pressure has been stable and has been taking his medications regularly.  His weight has been maintained. He weights 154 pounds. Vincent Kennedy states his cholesterol has been good. He is going to try to finihs out [facility] since he is feeling better. He states after [facility] that he wants to come back to the [facility] program.     Expected Outcomes  Short: attend [facility] to decrease stress. Long Continue to exercise to minimize stress.  Short:  Continue to work on his SOB with exercise.  Long: Continue to work on risk factor modificiations.   Short: graduate [place]. Long: come back to Pulmonary rehab.        Core Components/Risk Factors/Patient Goals at Discharge (Final Review):  Goals and Risk Factor Review - 04/13/17 0747      Core Components/Risk Factors/Patient Goals Review   Personal Goals Review  Hypertension;Improve shortness of breath with ADL's;Weight Management/Obesity;Lipids    Review  His shortness of breath has been feeling worse since he has been out for a little while. His blood pressure has been stable and has been taking his medications regularly.  His weight has been maintained. He weights 154 pounds. Vincent Kennedy states his cholesterol has been good. He is going to try to finihs out [facility] since he is feeling better. He states after [facility] that he wants to come back to the [facility] program.    Expected Outcomes  Short: graduate [place]. Long: come back to Pulmonary rehab.       ITP Comments: ITP Comments    Row Name 01/24/17 1324 01/26/17 0639 01/31/17 0942 02/23/17 0541 03/23/17 0625   ITP Comments  Medical Review Completed; initial ITP created. Diagnosis Documentation can be found in [facility] note dated 01/20/2017.  30 day review. Continue with ITP unless directed changes per Medical Director review.  New to program  Order received to increase oxygen flow to 6L for exercise.   30 day review. Continue with ITP unless directed changes per Medical Director review.   30 day review. Continue with ITP unless directed changes per Medical Director review.    Row Name 04/08/17 [phone] 04/11/17 0948 04/20/17 0551       ITP Comments  Vincent Kennedy has been out this week as he had a stress test scheduled.  His test was normal and he is now cleared to return to rehab.   Vincent Kennedy has been after seeking a stress test after he saw his saturations dropping one day.  His stress test was negative and he was cleared to return to rehab. He  waited to hear back from office about straightening out his medications.  He hopes to return on Wednesday.   30 Day review. Continue with ITP unless directed changes per Medical Director review.        Comments:

## 2017-04-21 DIAGNOSIS — J9601 Acute respiratory failure with hypoxia: Secondary | ICD-10-CM | POA: Diagnosis not present

## 2017-04-22 ENCOUNTER — Encounter: Payer: Medicare Other | Attending: Cardiovascular Disease | Admitting: *Deleted

## 2017-04-22 DIAGNOSIS — J8410 Pulmonary fibrosis, unspecified: Secondary | ICD-10-CM | POA: Insufficient documentation

## 2017-04-22 DIAGNOSIS — Z7982 Long term (current) use of aspirin: Secondary | ICD-10-CM | POA: Diagnosis not present

## 2017-04-22 DIAGNOSIS — Z7951 Long term (current) use of inhaled steroids: Secondary | ICD-10-CM | POA: Diagnosis not present

## 2017-04-22 DIAGNOSIS — G4730 Sleep apnea, unspecified: Secondary | ICD-10-CM | POA: Diagnosis not present

## 2017-04-22 DIAGNOSIS — I2510 Atherosclerotic heart disease of native coronary artery without angina pectoris: Secondary | ICD-10-CM | POA: Diagnosis not present

## 2017-04-22 DIAGNOSIS — Z955 Presence of coronary angioplasty implant and graft: Secondary | ICD-10-CM | POA: Diagnosis not present

## 2017-04-22 DIAGNOSIS — I10 Essential (primary) hypertension: Secondary | ICD-10-CM | POA: Insufficient documentation

## 2017-04-22 DIAGNOSIS — E785 Hyperlipidemia, unspecified: Secondary | ICD-10-CM | POA: Insufficient documentation

## 2017-04-22 DIAGNOSIS — Z79899 Other long term (current) drug therapy: Secondary | ICD-10-CM | POA: Insufficient documentation

## 2017-04-22 NOTE — Progress Notes (Signed)
Daily Session Note  Patient Details  Name: NEWT LEVINGSTON MRN: [id number] Date of Birth: 1932/12/03 Referring Provider:     Cardiac Rehab from 01/24/2017 in [facility] Cardiac and Pulmonary Rehab  Referring Provider  Kathlyn Sacramento MD      Encounter Date: 04/22/2017  Check In: Session Check In - 04/22/17 9163      Check-In   Location  [facility]-Cardiac & Pulmonary Rehab    Staff Present  Alberteen Sam, MA, RCEP, CCRP, Exercise Physiologist;Amanda Oletta Darter, BA, ACSM CEP, Exercise Physiologist;Meredith Sherryll Burger, RN BSN    Supervising physician immediately available to respond to emergencies  See telemetry face sheet for immediately available ER MD    Medication changes reported      No    Fall or balance concerns reported     No    Warm-up and Cool-down  Performed on first and last piece of equipment    Resistance Training Performed  Yes    VAD Patient?  No      Pain Assessment   Currently in Pain?  No/denies          Social History   Tobacco Use  Smoking Status Never Smoker  Smokeless Tobacco Never Used    Goals Met:  Independence with exercise equipment Exercise tolerated well No report of cardiac concerns or symptoms Strength training completed today  Goals Unmet:  Not Applicable  Comments: Pt able to follow exercise prescription today without complaint.  Will continue to monitor for progression.    Dr. Emily Filbert is Medical Director for [name].

## 2017-04-25 ENCOUNTER — Encounter: Payer: Medicare Other | Admitting: *Deleted

## 2017-04-25 DIAGNOSIS — J8410 Pulmonary fibrosis, unspecified: Secondary | ICD-10-CM

## 2017-04-25 DIAGNOSIS — Z955 Presence of coronary angioplasty implant and graft: Secondary | ICD-10-CM | POA: Diagnosis not present

## 2017-04-25 DIAGNOSIS — Z79899 Other long term (current) drug therapy: Secondary | ICD-10-CM | POA: Diagnosis not present

## 2017-04-25 DIAGNOSIS — I10 Essential (primary) hypertension: Secondary | ICD-10-CM | POA: Diagnosis not present

## 2017-04-25 DIAGNOSIS — Z7951 Long term (current) use of inhaled steroids: Secondary | ICD-10-CM | POA: Diagnosis not present

## 2017-04-25 DIAGNOSIS — I2510 Atherosclerotic heart disease of native coronary artery without angina pectoris: Secondary | ICD-10-CM | POA: Diagnosis not present

## 2017-04-25 DIAGNOSIS — E785 Hyperlipidemia, unspecified: Secondary | ICD-10-CM | POA: Diagnosis not present

## 2017-04-25 DIAGNOSIS — Z7982 Long term (current) use of aspirin: Secondary | ICD-10-CM | POA: Diagnosis not present

## 2017-04-25 DIAGNOSIS — G4730 Sleep apnea, unspecified: Secondary | ICD-10-CM | POA: Diagnosis not present

## 2017-04-25 NOTE — Progress Notes (Signed)
Daily Session Note  Patient Details  Name: Vincent Kennedy MRN: [id number] Date of Birth: Jan 21, 1933 Referring Provider:     Cardiac Rehab from 01/24/2017 in [facility] Cardiac and Pulmonary Rehab  Referring Provider  Kathlyn Sacramento MD      Encounter Date: 04/25/2017  Check In: Session Check In - 04/25/17 0806      Check-In   Location  [facility]-Cardiac & Pulmonary Rehab    Staff Present  Alberteen Sam, MA, RCEP, CCRP, Exercise Physiologist;Kelly Amedeo Plenty, BS, ACSM CEP, Exercise Physiologist;Susanne Bice, RN, BSN, CCRP    Supervising physician immediately available to respond to emergencies  See telemetry face sheet for immediately available ER MD    Medication changes reported      No    Fall or balance concerns reported     No    Warm-up and Cool-down  Performed on first and last piece of equipment    Resistance Training Performed  Yes    VAD Patient?  No      Pain Assessment   Currently in Pain?  No/denies    Multiple Pain Sites  No          Social History   Tobacco Use  Smoking Status Never Smoker  Smokeless Tobacco Never Used    Goals Met:  Independence with exercise equipment Exercise tolerated well No report of cardiac concerns or symptoms Strength training completed today  Goals Unmet:  Not Applicable  Comments: Pt able to follow exercise prescription today without complaint.  Will continue to monitor for progression.    Dr. Emily Filbert is Medical Director for [facility] and [name].

## 2017-04-27 DIAGNOSIS — G4730 Sleep apnea, unspecified: Secondary | ICD-10-CM | POA: Diagnosis not present

## 2017-04-27 DIAGNOSIS — Z79899 Other long term (current) drug therapy: Secondary | ICD-10-CM | POA: Diagnosis not present

## 2017-04-27 DIAGNOSIS — J8410 Pulmonary fibrosis, unspecified: Secondary | ICD-10-CM | POA: Diagnosis not present

## 2017-04-27 DIAGNOSIS — I2510 Atherosclerotic heart disease of native coronary artery without angina pectoris: Secondary | ICD-10-CM | POA: Diagnosis not present

## 2017-04-27 DIAGNOSIS — E785 Hyperlipidemia, unspecified: Secondary | ICD-10-CM | POA: Diagnosis not present

## 2017-04-27 DIAGNOSIS — Z955 Presence of coronary angioplasty implant and graft: Secondary | ICD-10-CM

## 2017-04-27 DIAGNOSIS — I10 Essential (primary) hypertension: Secondary | ICD-10-CM | POA: Diagnosis not present

## 2017-04-27 DIAGNOSIS — Z7951 Long term (current) use of inhaled steroids: Secondary | ICD-10-CM | POA: Diagnosis not present

## 2017-04-27 DIAGNOSIS — Z7982 Long term (current) use of aspirin: Secondary | ICD-10-CM | POA: Diagnosis not present

## 2017-04-27 NOTE — Patient Instructions (Signed)
Discharge Patient Instructions  Patient Details  Name: Vincent Kennedy MRN: [id number] Date of Birth: Aug 12, 1932 Referring Provider:  [place]*   Number of Visits: 36/36  Reason for Discharge:  Patient reached a stable level of exercise. Patient independent in their exercise. Patient has met program and personal goals.  Smoking History:  Social History   Tobacco Use  Smoking Status Never Smoker  Smokeless Tobacco Never Used    Diagnosis:  Status post coronary artery stent placement  Initial Exercise Prescription: Initial Exercise Prescription - 01/24/17 1400      Date of Initial Exercise RX and Referring Provider   Date  01/24/17    Referring Provider  Kathlyn Sacramento MD      Oxygen   Oxygen  Continuous    Liters  4      Treadmill   MPH  1.5    Grade  0    Minutes  15    METs  2.15      NuStep   Level  1    SPM  80    Minutes  15    METs  1.7      Arm Ergometer   Level  1    RPM  25    Minutes  15    METs  1.7      Prescription Details   Frequency (times per week)  3    Duration  Progress to 45 minutes of aerobic exercise without signs/symptoms of physical distress      Intensity   THRR 40-80% of Max Heartrate  101-126    Ratings of Perceived Exertion  11-13    Perceived Dyspnea  0-4      Progression   Progression  Continue to progress workloads to maintain intensity without signs/symptoms of physical distress.      Resistance Training   Training Prescription  Yes    Weight  3 lbs    Reps  10-15       Discharge Exercise Prescription (Final Exercise Prescription Changes): Exercise Prescription Changes - 04/26/17 1500      Response to Exercise   Blood Pressure (Admit)  132/70    Blood Pressure (Exercise)  130/64    Blood Pressure (Exit)  134/60    Heart Rate (Admit)  86 bpm    Heart Rate (Exercise)  111 bpm    Heart Rate (Exit)  68 bpm    Oxygen Saturation (Exercise)  94 %    Oxygen Saturation (Exit)  94 %    Rating  of Perceived Exertion (Exercise)  12    Symptoms  none    Duration  Continue with 45 min of aerobic exercise without signs/symptoms of physical distress.    Intensity  THRR unchanged      Progression   Progression  Continue to progress workloads to maintain intensity without signs/symptoms of physical distress.    Average METs  2.18      Resistance Training   Training Prescription  Yes    Weight  4 lbs    Reps  10-15      Interval Training   Interval Training  No      Oxygen   Oxygen  Continuous    Liters  6      Treadmill   MPH  1.5    Grade  0    Minutes  15    METs  2.15      NuStep   Level  6  Minutes  15    METs  2.3      Arm Ergometer   Level  2    Minutes  15    METs  2.1      Home Exercise Plan   Plans to continue exercise at  Home (comment) walking on treadmill    Frequency  Add 2 additional days to program exercise sessions.    Initial Home Exercises Provided  01/28/17       Functional Capacity: 6 Minute Walk    Row Name 12/20/16 1634 01/24/17 1305 04/18/17 0847     6 Minute Walk   Phase  Initial  Initial  Discharge   Distance  760 feet  250 feet  430 feet   Distance % Change  -  -  72 %   Distance Feet Change  -  -  180 ft   Walk Time  4.4 minutes stopped test due to desaturations  1.63 minutes  2.68 minutes   # of Rest Breaks  1 at end of test  1 test stopped at 1:38 for desaturation  1 test stopped for fatigue and desaturation   MPH  1.96  1.74  1.79   METS  1.93  0.94  0.88   RPE  '14  13  15   '$ Perceived Dyspnea   '3  2  3   '$ VO2 Peak  6.76  3.29  3.07   Symptoms  Yes (comment)  Yes (comment) wearing concentrator on back  Yes (comment)   Comments  SOB  SOB  SOB, fatigue   Resting HR  73 bpm  77 bpm  85 bpm   Resting BP  126/64  148/84  144/64   Resting Oxygen Saturation   94 %  97 %  94 %   Exercise Oxygen Saturation  during 6 min walk  79 %  73 %  79 %   Max Ex. HR  119 bpm  122 bpm  95 bpm   Max Ex. BP  154/74  152/64  146/64   2  Minute Post BP  156/64 recheck 146/72  144/70  -     Interval HR   1 Minute HR  73  80  93   2 Minute HR  116  122 test stopped at 1:38  93   3 Minute HR  104  -  86   4 Minute HR  116  66  -   5 Minute HR  119 stopped at 4:24 due desaturation to 79%  -  -   2 Minute Post HR  80  -  -   Interval Heart Rate?  Yes  Yes  Yes     Interval Oxygen   Interval Oxygen?  Yes  Yes  Yes   Baseline Oxygen Saturation %  94 %  97 %  94 %   1 Minute Oxygen Saturation %  92 %  80 %  88 %   1 Minute Liters of Oxygen  4 L pulsed  4 L pulsed  4 L   2 Minute Oxygen Saturation %  83 %  73 % test stopped at 1:38 desaturation at 75%  80 % 2:41 seated 79%   2 Minute Liters of Oxygen  4 L  4 L  4 L   3 Minute Oxygen Saturation %  81 %  90 %  80 %   3 Minute Liters of Oxygen  4 L  4 L  6  L increased to 6L to recover   4 Minute Oxygen Saturation %  80 % 79% at 4:24  94 %  90 %   4 Minute Liters of Oxygen  4 L  4 L  6 L   2 Minute Post Oxygen Saturation %  92 %  -  -   2 Minute Post Liters of Oxygen  4 L  -  -      Quality of Life: Quality of Life - 01/24/17 1340      Quality of Life Scores   Health/Function Pre  17.11 %    Socioeconomic Pre  26 %    Psych/Spiritual Pre  18.57 %    Family Pre  29 %    GLOBAL Pre  21.11 %       Personal Goals: Goals established at orientation with interventions provided to work toward goal. Personal Goals and Risk Factors at Admission - 01/24/17 1335      Core Components/Risk Factors/Patient Goals on Admission    Weight Management  Yes;Weight Maintenance    Intervention  Weight Management: Develop a combined nutrition and exercise program designed to reach desired caloric intake, while maintaining appropriate intake of nutrient and fiber, sodium and fats, and appropriate energy expenditure required for the weight goal.;Weight Management: Provide education and appropriate resources to help participant work on and attain dietary goals.;Weight Management/Obesity:  Establish reasonable short term and long term weight goals.    Admit Weight  155 lb 9.6 oz (70.6 kg)    Goal Weight: Short Term  150 lb (68 kg)    Goal Weight: Long Term  150 lb (68 kg)    Expected Outcomes  Weight Maintenance: Understanding of the daily nutrition guidelines, which includes 25-35% calories from fat, 7% or less cal from saturated fats, less than '200mg'$  cholesterol, less than 1.5gm of sodium, & 5 or more servings of fruits and vegetables daily;Long Term: Adherence to nutrition and physical activity/exercise program aimed toward attainment of established weight goal;Short Term: Continue to assess and modify interventions until short term weight is achieved;Understanding recommendations for meals to include 15-35% energy as protein, 25-35% energy from fat, 35-60% energy from carbohydrates, less than '200mg'$  of dietary cholesterol, 20-35 gm of total fiber daily;Understanding of distribution of calorie intake throughout the day with the consumption of 4-5 meals/snacks    Improve shortness of breath with ADL's  Yes Patient would like to be able to walk and do things around the house without getting so short of breath    Intervention  Provide education, individualized exercise plan and daily activity instruction to help decrease symptoms of SOB with activities of daily living.    Expected Outcomes  Short Term: Achieves a reduction of symptoms when performing activities of daily living.    Develop more efficient breathing techniques such as purse lipped breathing and diaphragmatic breathing; and practicing self-pacing with activity  Yes    Intervention  Provide education, demonstration and support about specific breathing techniuqes utilized for more efficient breathing. Include techniques such as pursed lipped breathing, diaphragmatic breathing and self-pacing activity.    Expected Outcomes  Short Term: Participant will be able to demonstrate and use breathing techniques as needed throughout daily  activities.    Increase knowledge of respiratory medications and ability to use respiratory devices properly   Yes    Intervention  Provide education and demonstration as needed of appropriate use of medications, inhalers, and oxygen therapy.    Expected Outcomes  Short Term: Achieves  understanding of medications use. Understands that oxygen is a medication prescribed by physician. Demonstrates appropriate use of inhaler and oxygen therapy.    Hypertension  Yes    Intervention  Provide education on lifestyle modifcations including regular physical activity/exercise, weight management, moderate sodium restriction and increased consumption of fresh fruit, vegetables, and low fat dairy, alcohol moderation, and smoking cessation.;Monitor prescription use compliance.    Expected Outcomes  Short Term: Continued assessment and intervention until BP is < 140/66m HG in hypertensive participants. < 130/872mHG in hypertensive participants with diabetes, heart failure or chronic kidney disease.;Long Term: Maintenance of blood pressure at goal levels.    Lipids  Yes    Intervention  Provide education and support for participant on nutrition & aerobic/resistive exercise along with prescribed medications to achieve LDL '70mg'$ , HDL >'40mg'$ .    Expected Outcomes  Short Term: Participant states understanding of desired cholesterol values and is compliant with medications prescribed. Participant is following exercise prescription and nutrition guidelines.;Long Term: Cholesterol controlled with medications as prescribed, with individualized exercise RX and with personalized nutrition plan. Value goals: LDL < '70mg'$ , HDL > 40 mg.    Stress  Yes    Intervention  Offer individual and/or small group education and counseling on adjustment to heart disease, stress management and health-related lifestyle change. Teach and support self-help strategies.;Refer participants experiencing significant psychosocial distress to appropriate  mental health specialists for further evaluation and treatment. When possible, include family members and significant others in education/counseling sessions.    Expected Outcomes  Short Term: Participant demonstrates changes in health-related behavior, relaxation and other stress management skills, ability to obtain effective social support, and compliance with psychotropic medications if prescribed.;Long Term: Emotional wellbeing is indicated by absence of clinically significant psychosocial distress or social isolation.        Personal Goals Discharge: Goals and Risk Factor Review - 04/13/17 0747      Core Components/Risk Factors/Patient Goals Review   Personal Goals Review  Hypertension;Improve shortness of breath with ADL's;Weight Management/Obesity;Lipids    Review  His shortness of breath has been feeling worse since he has been out for a little while. His blood pressure has been stable and has been taking his medications regularly.  His weight has been maintained. He weights 154 pounds. Vincent Kennedy states his cholesterol has been good. He is going to try to finihs out [facility] since he is feeling better. He states after [facility] that he wants to come back to [facility].    Expected Outcomes  [facility] [facility] come back to [facility] rehab.       Exercise Goals and Review: Exercise Goals    Row Name 12/20/16 1654 01/24/17 1402           Exercise Goals   Increase Physical Activity  Yes  Yes      Intervention  Provide advice, education, support and counseling about physical activity/exercise needs.;Develop an individualized exercise prescription for aerobic and resistive training based on initial evaluation findings, risk stratification, comorbidities and participant's personal goals.  Provide advice, education, support and counseling about physical activity/exercise needs.;Develop an individualized exercise prescription for aerobic and resistive training based on  initial evaluation findings, risk stratification, comorbidities and participant's personal goals.      Expected Outcomes  Achievement of increased cardiorespiratory fitness and enhanced flexibility, muscular endurance and strength shown through measurements of functional capacity and personal statement of participant.  Achievement of increased cardiorespiratory fitness and enhanced flexibility, muscular endurance and strength shown through measurements  of functional capacity and personal statement of participant.      Increase Strength and Stamina  Yes  Yes      Intervention  Provide advice, education, support and counseling about physical activity/exercise needs.;Develop an individualized exercise prescription for aerobic and resistive training based on initial evaluation findings, risk stratification, comorbidities and participant's personal goals.  Provide advice, education, support and counseling about physical activity/exercise needs.;Develop an individualized exercise prescription for aerobic and resistive training based on initial evaluation findings, risk stratification, comorbidities and participant's personal goals.      Expected Outcomes  Achievement of increased cardiorespiratory fitness and enhanced flexibility, muscular endurance and strength shown through measurements of functional capacity and personal statement of participant.  Achievement of increased cardiorespiratory fitness and enhanced flexibility, muscular endurance and strength shown through measurements of functional capacity and personal statement of participant.      Able to understand and use rate of perceived exertion (RPE) scale  Yes  Yes      Intervention  Provide education and explanation on how to use RPE scale  Provide education and explanation on how to use RPE scale      Expected Outcomes  Short Term: Able to use RPE daily in rehab to express subjective intensity level;Long Term:  Able to use RPE to guide intensity level  when exercising independently  Short Term: Able to use RPE daily in rehab to express subjective intensity level;Long Term:  Able to use RPE to guide intensity level when exercising independently      Able to understand and use Dyspnea scale  Yes  Yes      Intervention  Provide education and explanation on how to use Dyspnea scale  Provide education and explanation on how to use Dyspnea scale      Expected Outcomes  Short Term: Able to use Dyspnea scale daily in rehab to express subjective sense of shortness of breath during exertion;Long Term: Able to use Dyspnea scale to guide intensity level when exercising independently  Short Term: Able to use Dyspnea scale daily in rehab to express subjective sense of shortness of breath during exertion;Long Term: Able to use Dyspnea scale to guide intensity level when exercising independently      Knowledge and understanding of Target Heart Rate Range (THRR)  Yes  Yes      Intervention  Provide education and explanation of THRR including how the numbers were predicted and where they are located for reference  Provide education and explanation of THRR including how the numbers were predicted and where they are located for reference      Expected Outcomes  Short Term: Able to state/look up THRR;Long Term: Able to use THRR to govern intensity when exercising independently;Short Term: Able to use daily as guideline for intensity in rehab  Short Term: Able to state/look up THRR;Long Term: Able to use THRR to govern intensity when exercising independently;Short Term: Able to use daily as guideline for intensity in rehab      Able to check pulse independently  Yes  Yes      Intervention  Provide education and demonstration on how to check pulse in carotid and radial arteries.;Review the importance of being able to check your own pulse for safety during independent exercise  Provide education and demonstration on how to check pulse in carotid and radial arteries.;Review the  importance of being able to check your own pulse for safety during independent exercise      Expected Outcomes  Long Term: Able  to check pulse independently and accurately;Short Term: Able to explain why pulse checking is important during independent exercise  Long Term: Able to check pulse independently and accurately;Short Term: Able to explain why pulse checking is important during independent exercise      Understanding of Exercise Prescription  Yes  Yes      Intervention  Provide education, explanation, and written materials on patient's individual exercise prescription  Provide education, explanation, and written materials on patient's individual exercise prescription      Expected Outcomes  Short Term: Able to explain program exercise prescription;Long Term: Able to explain home exercise prescription to exercise independently  Short Term: Able to explain program exercise prescription;Long Term: Able to explain home exercise prescription to exercise independently         Nutrition & Weight - Outcomes: Pre Biometrics - 01/24/17 1403      Pre Biometrics   Height  5' 6.1" (1.679 m)    Weight  155 lb 9.6 oz (70.6 kg)    Waist Circumference  37.5 inches    Hip Circumference  36 inches    Waist to Hip Ratio  1.04 %    BMI (Calculated)  25.04    Single Leg Stand  18.09 seconds        Nutrition: Nutrition Therapy & Goals - 04/25/17 0833      Nutrition Therapy   Diet  TLC    Protein (specify units)  8oz    Fiber  30 grams    Saturated Fats  12 max. grams    Fruits and Vegetables  4 servings/day    Sodium  1500 grams      Personal Nutrition Goals   Nutrition Goal  Try Ensure supplement drinks, either by themselves or added to homemade shakes. 1-2/day to help restore weight.    Personal Goal #2  Add healthy fats to meals and snacks to provide additional calories    Personal Goal #3  Cook oatmeal with whole milk instead of water    Personal Goal #4  Eat 4-6 small meals/ snacks  throughout the day rather than 3 larger meals      Intervention Plan   Intervention  Prescribe, educate and counsel regarding individualized specific dietary modifications aiming towards targeted core components such as weight, hypertension, lipid management, diabetes, heart failure and other comorbidities.    Expected Outcomes  Short Term Goal: Understand basic principles of dietary content, such as calories, fat, sodium, cholesterol and nutrients.;Short Term Goal: A plan has been developed with personal nutrition goals set during dietitian appointment.;Long Term Goal: Adherence to prescribed nutrition plan.       Nutrition Discharge: Nutrition Assessments - 01/24/17 1338      MEDFICTS Scores   Pre Score  -- will bring in to first session       Education Questionnaire Score: Knowledge Questionnaire Score - 01/24/17 1353      Knowledge Questionnaire Score   Pre Score  24/28 Reviewed correct answeres with patient who verbalied understanding.       Goals reviewed with patient; copy given to patient.

## 2017-04-27 NOTE — Progress Notes (Signed)
Daily Session Note  Patient Details  Name: Vincent Kennedy MRN: [id number] Date of Birth: 12-06-1932 Referring Provider:     Cardiac Rehab from 01/24/2017 in [facility] Card[facility]  Referring Provider  Kathlyn Sacramento MD      Encounter Date: 04/27/2017  Check In: Session Check In - 04/27/17 0818      Check-In   Location  [facility]-Cardiac & Pulmonary Rehab    Staff Present  Justin Mend RCP,RRT,BSRT;Heath Lark, RN, BSN, CCRP;Jessica Luan Pulling, MA, RCEP, CCRP, Exercise Physiologist    Supervising physician immediately available to respond to emergencies  See telemetry face sheet for immediately available ER MD    Medication changes reported      No    Fall or balance concerns reported     No    Warm-up and Cool-down  Performed on first and last piece of equipment    Resistance Training Performed  Yes    VAD Patient?  No      Pain Assessment   Currently in Pain?  No/denies        Exercise Prescription Changes - 04/26/17 1500      Response to Exercise   Blood Pressure (Admit)  132/70    Blood Pressure (Exercise)  130/64    Blood Pressure (Exit)  134/60    Heart Rate (Admit)  86 bpm    Heart Rate (Exercise)  111 bpm    Heart Rate (Exit)  68 bpm    Oxygen Saturation (Exercise)  94 %    Oxygen Saturation (Exit)  94 %    Rating of Perceived Exertion (Exercise)  12    Symptoms  none    Duration  Continue with 45 min of aerobic exercise without signs/symptoms of physical distress.    Intensity  THRR unchanged      Progression   Progression  Continue to progress workloads to maintain intensity without signs/symptoms of physical distress.    Average METs  2.18      Resistance Training   Training Prescription  Yes    Weight  4 lbs    Reps  10-15      Interval Training   Interval Training  No      Oxygen   Oxygen  Continuous    Liters  6      Treadmill   MPH  1.5    Grade  0    Minutes  15    METs  2.15      NuStep   Level  6    Minutes  15    METs  2.3       Arm Ergometer   Level  2    Minutes  15    METs  2.1      Home Exercise Plan   Plans to continue exercise at  Home (comment) walking on treadmill    Frequency  Add 2 additional days to program exercise sessions.    Initial Home Exercises Provided  01/28/17       Social History   Tobacco Use  Smoking Status Never Smoker  Smokeless Tobacco Never Used    Goals Met:  Independence with exercise equipment Exercise tolerated well No report of cardiac concerns or symptoms Strength training completed today  Goals Unmet:  Not Applicable  Comments: Pt able to follow exercise prescription today without complaint.  Will continue to monitor for progression.   Dr. Emily Filbert is Medical Director for [name] and [name].

## 2017-05-04 DIAGNOSIS — Z7951 Long term (current) use of inhaled steroids: Secondary | ICD-10-CM | POA: Diagnosis not present

## 2017-05-04 DIAGNOSIS — Z7982 Long term (current) use of aspirin: Secondary | ICD-10-CM | POA: Diagnosis not present

## 2017-05-04 DIAGNOSIS — E785 Hyperlipidemia, unspecified: Secondary | ICD-10-CM | POA: Diagnosis not present

## 2017-05-04 DIAGNOSIS — I2510 Atherosclerotic heart disease of native coronary artery without angina pectoris: Secondary | ICD-10-CM | POA: Diagnosis not present

## 2017-05-04 DIAGNOSIS — J8410 Pulmonary fibrosis, unspecified: Secondary | ICD-10-CM | POA: Diagnosis not present

## 2017-05-04 DIAGNOSIS — Z79899 Other long term (current) drug therapy: Secondary | ICD-10-CM | POA: Diagnosis not present

## 2017-05-04 DIAGNOSIS — G4730 Sleep apnea, unspecified: Secondary | ICD-10-CM | POA: Diagnosis not present

## 2017-05-04 DIAGNOSIS — Z955 Presence of coronary angioplasty implant and graft: Secondary | ICD-10-CM | POA: Diagnosis not present

## 2017-05-04 DIAGNOSIS — I10 Essential (primary) hypertension: Secondary | ICD-10-CM | POA: Diagnosis not present

## 2017-05-04 NOTE — Progress Notes (Signed)
Cardiac Individual Treatment Plan  Patient Details  Name: ADELBERT GASPARD MRN: [id number] Date of Birth: 01/06/1933 Referring Provider:     Cardiac Rehab from 01/24/2017 in [facility] Cardiac and Pulmonary Rehab  Referring Provider  Kathlyn Sacramento MD      Initial Encounter Date:    Cardiac Rehab from 01/24/2017 in [facility] Cardiac and Pulmonary Rehab  Date  01/24/17  Referring Provider  Kathlyn Sacramento MD      Visit Diagnosis: Status post coronary artery stent placement  Patient's Home Medications on Admission:  Current Outpatient Medications:  .  amLODipine (NORVASC) 2.5 MG tablet, Take 1 tablet (2.5 mg total) by mouth daily., Disp: 180 tablet, Rfl: 3 .  aspirin EC 81 MG tablet, Take 1 tablet (81 mg total) by mouth daily., Disp: 30 tablet, Rfl: 6 .  budesonide (PULMICORT) 0.5 MG/2ML nebulizer solution, Take 2 mLs (0.5 mg total) by nebulization 2 (two) times daily., Disp: 260 mL, Rfl: 3 .  chlorproMAZINE (THORAZINE) 25 MG tablet, Take 1 tablet (25 mg total) by mouth 3 (three) times daily as needed for hiccoughs., Disp: 60 tablet, Rfl: 0 .  clopidogrel (PLAVIX) 75 MG tablet, Take 1 tablet (75 mg total) by mouth daily., Disp: 30 tablet, Rfl: 6 .  finasteride (PROSCAR) 5 MG tablet, Take 1 tablet (5 mg total) by mouth daily., Disp: 90 tablet, Rfl: 3 .  metoprolol tartrate (LOPRESSOR) 25 MG tablet, Take 1 tablet (25 mg total) by mouth 2 (two) times daily., Disp: 180 tablet, Rfl: 3 .  mometasone-formoterol (DULERA) 100-5 MCG/ACT AERO, Inhale 2 puffs into the lungs 2 (two) times daily., Disp: 1 Inhaler, Rfl: 1 .  nitroGLYCERIN (NITROSTAT) 0.4 MG SL tablet, Place 1 tablet (0.4 mg total) under the tongue every 5 (five) minutes as needed for chest pain., Disp: 25 tablet, Rfl: 12 .  pantoprazole (PROTONIX) 40 MG tablet, Take 1 tablet (40 mg total) by mouth daily., Disp: 30 tablet, Rfl: 6 .  Pirfenidone (ESBRIET) 267 MG CAPS, Take 801 mg by mouth 3 (three) times daily. , Disp: , Rfl:  .  rivastigmine  (EXELON) 9.5 mg/24hr, Place 1 patch (9.5 mg total) onto the skin daily. (Patient taking differently: Place 9.5 mg onto the skin once a week. ), Disp: 30 patch, Rfl: 2 .  rosuvastatin (CRESTOR) 40 MG tablet, Take 40 mg by mouth every day, Disp: 90 tablet, Rfl: 3 .  zolpidem (AMBIEN) 10 MG tablet, Take 1 tablet (10 mg total) by mouth at bedtime as needed for sleep., Disp: 90 tablet, Rfl: 1  Past Medical History: Past Medical History:  Diagnosis Date  . Abnormal nuclear cardiac imaging test    Dr. Fletcher Anon  . BPH (benign prostatic hyperplasia)   . Coronary artery disease    Cardiac cath September 2008: 20% left main stenosis, 40% mid LAD stenosis, 99% ostial first diagonal stenosis in a large branch and 90% ostial disease in second diagonal but the vessel was very small, 20% mid RCA stenosis with normal ejection fraction. Successful angioplasty and drug-eluting stent placement to the ostial first diagonal with a 2.5 x 15 mm Xience stent  . GERD (gastroesophageal reflux disease)   . Hyperlipidemia   . Hypertension    BP has never been high(per pt).  Metoprolol is to keep BP low because of stent (afterload)  . On home oxygen therapy    "2-3L; 24/7" (01/19/2017)  . OSA on CPAP   . Oxygen deficiency   . Pneumonia 07/2016  . Pulmonary fibrosis ([place]) 08/26/2015  .  Shortness of breath dyspnea    with exertion  . Wears hearing aid    (sometimes)    Tobacco Use: Social History   Tobacco Use  Smoking Status Never Smoker  Smokeless Tobacco Never Used    Labs: Recent Review Flowsheet Data    Labs for ITP Cardiac and Pulmonary Rehab Latest Ref Rng & Units 01/12/2017 01/12/2017 01/12/2017 01/12/2017 03/11/2017   Cholestrol 100 - 199 mg/dL - - - - 131   LDLCALC 0 - 99 mg/dL - - - - 73   HDL >39 mg/dL - - - - 38(L)   Trlycerides 0 - 149 mg/dL - - - - 99   PHART 7.350 - 7.450 7.378 - - - -   PCO2ART 32.0 - 48.0 mmHg 41.3 - - - -   HCO3 20.0 - 28.0 mmol/L 24.3 23.8 24.1 25.3 -   TCO2 22 - 32  mmol/L '26 25 25 27 '$ -   ACIDBASEDEF 0.0 - 2.0 mmol/L 1.0 2.0 1.0 1.0 -   O2SAT % 98.0 77.0 76.0 64.0 -       Exercise Target Goals:    Exercise Program Goal: Individual exercise prescription set using results from initial 6 min walk test and THRR while considering  patient's activity barriers and safety.   Exercise Prescription Goal: Initial exercise prescription builds to 30-45 minutes a day of aerobic activity, 2-3 days per week.  Home exercise guidelines will be given to patient during program as part of exercise prescription that the participant will acknowledge.  Activity Barriers & Risk Stratification: Activity Barriers & Cardiac Risk Stratification - 01/24/17 1341      Activity Barriers & Cardiac Risk Stratification   Activity Barriers  Deconditioning;Shortness of Breath    Cardiac Risk Stratification  High       6 Minute Walk: 6 Minute Walk    Row Name 12/20/16 1634 01/24/17 1305 04/18/17 0847     6 Minute Walk   Phase  Initial  Initial  Discharge   Distance  760 feet  250 feet  430 feet   Distance % Change  -  -  72 %   Distance Feet Change  -  -  180 ft   Walk Time  4.4 minutes stopped test due to desaturations  1.63 minutes  2.68 minutes   # of Rest Breaks  1 at end of test  1 test stopped at 1:38 for desaturation  1 test stopped for fatigue and desaturation   MPH  1.96  1.74  1.79   METS  1.93  0.94  0.88   RPE  '14  13  15   '$ Perceived Dyspnea   '3  2  3   '$ VO2 Peak  6.76  3.29  3.07   Symptoms  Yes (comment)  Yes (comment) wearing concentrator on back  Yes (comment)   Comments  SOB  SOB  SOB, fatigue   Resting HR  73 bpm  77 bpm  85 bpm   Resting BP  126/64  148/84  144/64   Resting Oxygen Saturation   94 %  97 %  94 %   Exercise Oxygen Saturation  during 6 min walk  79 %  73 %  79 %   Max Ex. HR  119 bpm  122 bpm  95 bpm   Max Ex. BP  154/74  152/64  146/64   2 Minute Post BP  156/64 recheck 146/72  144/70  -     Interval HR  1 Minute HR  73  80  93   2  Minute HR  116  122 test stopped at 1:38  93   3 Minute HR  104  -  86   4 Minute HR  116  66  -   5 Minute HR  119 stopped at 4:24 due desaturation to 79%  -  -   2 Minute Post HR  80  -  -   Interval Heart Rate?  Yes  Yes  Yes     Interval Oxygen   Interval Oxygen?  Yes  Yes  Yes   Baseline Oxygen Saturation %  94 %  97 %  94 %   1 Minute Oxygen Saturation %  92 %  80 %  88 %   1 Minute Liters of Oxygen  4 L pulsed  4 L pulsed  4 L   2 Minute Oxygen Saturation %  83 %  73 % test stopped at 1:38 desaturation at 75%  80 % 2:41 seated 79%   2 Minute Liters of Oxygen  4 L  4 L  4 L   3 Minute Oxygen Saturation %  81 %  90 %  80 %   3 Minute Liters of Oxygen  4 L  4 L  6 L increased to 6L to recover   4 Minute Oxygen Saturation %  80 % 79% at 4:24  94 %  90 %   4 Minute Liters of Oxygen  4 L  4 L  6 L   2 Minute Post Oxygen Saturation %  92 %  -  -   2 Minute Post Liters of Oxygen  4 L  -  -      Oxygen Initial Assessment: Oxygen Initial Assessment - 01/24/17 1304      Home Oxygen   Home Oxygen Device  Portable Concentrator;Home Concentrator;E-Tanks    Sleep Oxygen Prescription  CPAP    Liters per minute  2.5    Home Exercise Oxygen Prescription  Continuous    Liters per minute  4    Home at Rest Exercise Oxygen Prescription  Continuous    Liters per minute  3    Compliance with Home Oxygen Use  Yes      Initial 6 min Walk   Oxygen Used  Portable Concentrator;Pulsed    Liters per minute  4      Program Oxygen Prescription   Program Oxygen Prescription  Continuous    Liters per minute  4      Intervention   Short Term Goals  To learn and exhibit compliance with exercise, home and travel O2 prescription;To learn and understand importance of maintaining oxygen saturations>88%;To learn and understand importance of monitoring SPO2 with pulse oximeter and demonstrate accurate use of the pulse oximeter.;To learn and demonstrate proper pursed lip breathing techniques or other  breathing techniques.    Long  Term Goals  Exhibits compliance with exercise, home and travel O2 prescription;Verbalizes importance of monitoring SPO2 with pulse oximeter and return demonstration;Maintenance of O2 saturations>88%;Exhibits proper breathing techniques, such as pursed lip breathing or other method taught during program session;Compliance with respiratory medication       Oxygen Re-Evaluation: Oxygen Re-Evaluation    Row Name 02/14/17 0905 03/07/17 0802 04/13/17 0753         Program Oxygen Prescription   Program Oxygen Prescription  Continuous  Continuous  Continuous     Liters per minute  4  4  4     Comments  6 Liters on the treadmill  6 Liters on the treadmill  6 Liters on the treadmill       Home Oxygen   Home Oxygen Device  Portable Concentrator;Home Concentrator;E-Tanks  Portable Concentrator;Home Concentrator;E-Tanks  Portable Concentrator;Home Concentrator;E-Tanks     Sleep Oxygen Prescription  CPAP  CPAP  CPAP     Liters per minute  2.'5  3  3     '$ Home Exercise Oxygen Prescription  Continuous  Continuous  Continuous     Liters per minute  '4  4  4     '$ Home at Rest Exercise Oxygen Prescription  Continuous  Continuous  Continuous     Liters per minute  '3  3  3     '$ Compliance with Home Oxygen Use  Yes  Yes  Yes       Goals/Expected Outcomes   Short Term Goals  To learn and demonstrate proper use of respiratory medications;To learn and understand importance of maintaining oxygen saturations>88%;To learn and exhibit compliance with exercise, home and travel O2 prescription;To learn and understand importance of monitoring SPO2 with pulse oximeter and demonstrate accurate use of the pulse oximeter.;To learn and demonstrate proper pursed lip breathing techniques or other breathing techniques.  To learn and demonstrate proper use of respiratory medications;To learn and understand importance of maintaining oxygen saturations>88%;To learn and exhibit compliance with exercise,  home and travel O2 prescription;To learn and understand importance of monitoring SPO2 with pulse oximeter and demonstrate accurate use of the pulse oximeter.;To learn and demonstrate proper pursed lip breathing techniques or other breathing techniques.  To learn and demonstrate proper use of respiratory medications;To learn and understand importance of maintaining oxygen saturations>88%;To learn and exhibit compliance with exercise, home and travel O2 prescription;To learn and understand importance of monitoring SPO2 with pulse oximeter and demonstrate accurate use of the pulse oximeter.;To learn and demonstrate proper pursed lip breathing techniques or other breathing techniques.     Long  Term Goals  Compliance with respiratory medication;Demonstrates proper use of MDI's;Exhibits proper breathing techniques, such as pursed lip breathing or other method taught during program session;Maintenance of O2 saturations>88%;Verbalizes importance of monitoring SPO2 with pulse oximeter and return demonstration;Exhibits compliance with exercise, home and travel O2 prescription  Compliance with respiratory medication;Demonstrates proper use of MDI's;Exhibits proper breathing techniques, such as pursed lip breathing or other method taught during program session;Maintenance of O2 saturations>88%;Verbalizes importance of monitoring SPO2 with pulse oximeter and return demonstration;Exhibits compliance with exercise, home and travel O2 prescription  Compliance with respiratory medication;Demonstrates proper use of MDI's;Exhibits proper breathing techniques, such as pursed lip breathing or other method taught during program session;Maintenance of O2 saturations>88%;Verbalizes importance of monitoring SPO2 with pulse oximeter and return demonstration;Exhibits compliance with exercise, home and travel O2 prescription     Comments  Amjad uses his home oxygen as prescribed. He states he uses a CPAP everynight but it has been  getting too hot he says. Informed him that if the water is evaporating before he awakes the machine will get hot. Also told him that the humidifier setting is one of the settings that he can change himself. He states someone comes to his house every so often to check on his machine. Informed him to talk to his home care provider to make sure that his machine does not get too hot. Patient verbalizes understanding. He takes his nebulizer twice a day and uses Dulera as prescribed.  Amjad remains complaint with  his oxygen therapy.   He contineus to use his nebulizer daily and uses his pursed lip breathing technique frequently.  He is planning to take his CPAP to Dr. Laurelyn Sickle office when he has appointment on Wednesday.  He says that he has been using the humidifier and home concentrator at night.    Amjad states he has been using his CPAP and oxygen as prescribed. He is checking his oxygen at home, which runs about 93 percent at rest on 3 liters. His CPAP was making a noise in the past but has not had a chance to talk to Dr. [name] about getting his CPAP looked at..     Goals/Expected Outcomes  Short: make sure home CPAP is used correctly. Long: uses CPAP independently.  Short: Talk with Dr. [name] about CPAP.  Long: Continue to be indepedently in home oxygen therapy and CPAP use.   Short: Talk with Dr. [name] about his CPAP making noise.  Long: Continue to be indepedently in home oxygen therapy and CPAP use.         Oxygen Discharge (Final Oxygen Re-Evaluation): Oxygen Re-Evaluation - 04/13/17 0753      Program Oxygen Prescription   Program Oxygen Prescription  Continuous    Liters per minute  4    Comments  6 Liters on the treadmill      Home Oxygen   Home Oxygen Device  Portable Concentrator;Home Concentrator;E-Tanks    Sleep Oxygen Prescription  CPAP    Liters per minute  3    Home Exercise Oxygen Prescription  Continuous    Liters per minute  4    Home at Rest Exercise Oxygen Prescription  Continuous     Liters per minute  3    Compliance with Home Oxygen Use  Yes      Goals/Expected Outcomes   Short Term Goals  To learn and demonstrate proper use of respiratory medications;To learn and understand importance of maintaining oxygen saturations>88%;To learn and exhibit compliance with exercise, home and travel O2 prescription;To learn and understand importance of monitoring SPO2 with pulse oximeter and demonstrate accurate use of the pulse oximeter.;To learn and demonstrate proper pursed lip breathing techniques or other breathing techniques.    Long  Term Goals  Compliance with respiratory medication;Demonstrates proper use of MDI's;Exhibits proper breathing techniques, such as pursed lip breathing or other method taught during program session;Maintenance of O2 saturations>88%;Verbalizes importance of monitoring SPO2 with pulse oximeter and return demonstration;Exhibits compliance with exercise, home and travel O2 prescription    Comments  Amjad states he has been using his CPAP and oxygen as prescribed. He is checking his oxygen at home, which runs about 93 percent at rest on 3 liters. His CPAP was making a noise in the past but has not had a chance to talk to Dr. [name] about getting his CPAP looked at..    Goals/Expected Outcomes  Short: Talk with Dr. [name] about his CPAP making noise.  Long: Continue to be indepedently in home oxygen therapy and CPAP use.        Initial Exercise Prescription: Initial Exercise Prescription - 01/24/17 1400      Date of Initial Exercise RX and Referring Provider   Date  01/24/17    Referring Provider  Kathlyn Sacramento MD      Oxygen   Oxygen  Continuous    Liters  4      Treadmill   MPH  1.5    Grade  0    Minutes  15    METs  2.15      NuStep   Level  1    SPM  80    Minutes  15    METs  1.7      Arm Ergometer   Level  1    RPM  25    Minutes  15    METs  1.7      Prescription Details   Frequency (times per week)  3    Duration  Progress to 45  minutes of aerobic exercise without signs/symptoms of physical distress      Intensity   THRR 40-80% of Max Heartrate  101-126    Ratings of Perceived Exertion  11-13    Perceived Dyspnea  0-4      Progression   Progression  Continue to progress workloads to maintain intensity without signs/symptoms of physical distress.      Resistance Training   Training Prescription  Yes    Weight  3 lbs    Reps  10-15       Perform Capillary Blood Glucose checks as needed.  Exercise Prescription Changes: Exercise Prescription Changes    Row Name 12/20/16 1600 12/28/16 1600 01/12/17 1500 01/24/17 1400 01/28/17 0700     Response to Exercise   Blood Pressure (Admit)  126/64  128/54  124/64  148/84  -   Blood Pressure (Exercise)  154/74  130/80  -  152/64  -   Blood Pressure (Exit)  156/64 recheck 146/72  122/64  124/64  144/70  -   Heart Rate (Admit)  73 bpm  74 bpm  71 bpm  77 bpm  -   Heart Rate (Exercise)  120 bpm  92 bpm  94 bpm  122 bpm  -   Heart Rate (Exit)  80 bpm  72 bpm  63 bpm  66 bpm  -   Oxygen Saturation (Admit)  94 %  96 %  90 %  97 %  -   Oxygen Saturation (Exercise)  79 %  87 %  87 %  73 %  -   Oxygen Saturation (Exit)  92 %  99 %  98 %  90 %  -   Rating of Perceived Exertion (Exercise)  '14  11  12  13  '$ -   Perceived Dyspnea (Exercise)  3  1  1.5  2  -   Symptoms  SOB  none  none  none  -   Comments  walk test results  second full day of exercise  -  walk test results  -   Duration  -  Progress to 45 minutes of aerobic exercise without signs/symptoms of physical distress  Progress to 45 minutes of aerobic exercise without signs/symptoms of physical distress  -  -   Intensity  -  THRR unchanged  THRR unchanged  -  -     Progression   Progression  -  Continue to progress workloads to maintain intensity without signs/symptoms of physical distress.  Continue to progress workloads to maintain intensity without signs/symptoms of physical distress.  -  -   Average METs  -  2.25   2.42  -  -     Resistance Training   Training Prescription  -  Yes  Yes  -  -   Weight  -  3 lbs  3 lbs  -  -   Reps  -  10-15  10-15  -  -  Interval Training   Interval Training  -  No  No  -  -     Oxygen   Oxygen  -  Continuous  Continuous  -  -   Liters  -  3  3  -  -     Treadmill   MPH  -  1.5  2  -  -   Grade  -  0.5  0.5  -  -   Minutes  -  1  15  -  -   METs  -  2.25  2.67  -  -     NuStep   Level  -  2  4  -  -   SPM  -  100  114  -  -   Minutes  -  15  15  -  -   METs  -  1.9  2.3  -  -     REL-XR   Level  -  1  1  -  -   Speed  -  44  46  -  -   Minutes  -  15  15  -  -   METs  -  2.6  2.3  -  -     Home Exercise Plan   Plans to continue exercise at  -  -  -  -  Home (comment) walking on treadmill   Frequency  -  -  -  -  Add 1 additional day to program exercise sessions.   Initial Home Exercises Provided  -  -  -  -  01/28/17   Row Name 02/01/17 1300 02/16/17 1500 03/01/17 1200 03/16/17 1400 03/30/17 1500     Response to Exercise   Blood Pressure (Admit)  134/72  132/70  122/68  122/70  132/64   Blood Pressure (Exercise)  134/72  130/82  144/66  140/58  146/82   Blood Pressure (Exit)  124/50  108/66  114/64  126/78  120/60   Heart Rate (Admit)  63 bpm  67 bpm  64 bpm  91 bpm  78 bpm   Heart Rate (Exercise)  103 bpm  109 bpm  104 bpm  123 bpm  120 bpm   Heart Rate (Exit)  60 bpm  65 bpm  70 bpm  88 bpm  84 bpm   Oxygen Saturation (Exercise)  84 %  93 %  90 %  93 %  -   Oxygen Saturation (Exit)  -  -  93 %  97 %  -   Rating of Perceived Exertion (Exercise)  '12  11  13  13  12   '$ Perceived Dyspnea (Exercise)  2  -  -  -  -   Symptoms  none  none  none  none  none   Duration  Continue with 45 min of aerobic exercise without signs/symptoms of physical distress.  Continue with 45 min of aerobic exercise without signs/symptoms of physical distress.  Continue with 45 min of aerobic exercise without signs/symptoms of physical distress.  Continue with 45 min of  aerobic exercise without signs/symptoms of physical distress.  Continue with 45 min of aerobic exercise without signs/symptoms of physical distress.   Intensity  THRR unchanged  THRR unchanged  THRR unchanged  THRR unchanged  THRR unchanged     Progression   Progression  Continue to progress workloads to maintain intensity without signs/symptoms of physical distress.  Continue to  progress workloads to maintain intensity without signs/symptoms of physical distress.  Continue to progress workloads to maintain intensity without signs/symptoms of physical distress.  Continue to progress workloads to maintain intensity without signs/symptoms of physical distress.  Continue to progress workloads to maintain intensity without signs/symptoms of physical distress.   Average METs  2.32  2.25  2.32  2.08  2.35     Resistance Training   Training Prescription  Yes  Yes  Yes  Yes  Yes   Weight  3 lbs  3 lbs  3 lbs  3 lbs  4 lbs   Reps  10-15  10-15  10-15  10-15  10-15     Interval Training   Interval Training  No  No  No  No  No     Oxygen   Oxygen  Continuous  Continuous  Continuous  Continuous  Continuous   Liters  6  4-6  4-6  4-6  4-6     Treadmill   MPH  1.5  1.5  1.5  1.5  1.5   Grade  0  0  0  0  0   Minutes  '15  15  15  15  1   '$ METs  2.15  2.15  2.15  2.15  2.15     NuStep   Level  '4  4  4  6  6   '$ Minutes  '15  15  15  15  15   '$ METs  2.9  2.6  2.7  2.2  2.8     Arm Ergometer   Level  '1  1  2  2  2   '$ Minutes  '15  15  15  15  15   '$ METs  1.9  2  2.1  1.9  2.1     Home Exercise Plan   Plans to continue exercise at  Home (comment) walking on treadmill  Home (comment) walking on treadmill  Home (comment) walking on treadmill  Home (comment) walking on treadmill  Home (comment) walking on treadmill   Frequency  Add 1 additional day to program exercise sessions.  Add 1 additional day to program exercise sessions.  Add 1 additional day to program exercise sessions.  Add 2 additional days to  program exercise sessions.  Add 2 additional days to program exercise sessions.   Initial Home Exercises Provided  01/28/17  01/28/17  01/28/17  01/28/17  01/28/17   Row Name 04/14/17 1100 04/26/17 1500           Response to Exercise   Blood Pressure (Admit)  124/62  132/70      Blood Pressure (Exercise)  134/64  130/64      Blood Pressure (Exit)  124/74  134/60      Heart Rate (Admit)  80 bpm  86 bpm      Heart Rate (Exercise)  101 bpm  111 bpm      Heart Rate (Exit)  78 bpm  68 bpm      Oxygen Saturation (Admit)  94 %  -      Oxygen Saturation (Exercise)  94 %  94 %      Oxygen Saturation (Exit)  95 %  94 %      Rating of Perceived Exertion (Exercise)  12  12      Symptoms  none  none      Duration  Continue with 45 min of aerobic exercise without signs/symptoms of physical distress.  Continue with 45 min of aerobic exercise without signs/symptoms of physical distress.      Intensity  THRR unchanged  THRR unchanged        Progression   Progression  Continue to progress workloads to maintain intensity without signs/symptoms of physical distress.  Continue to progress workloads to maintain intensity without signs/symptoms of physical distress.      Average METs  2.15  2.18        Resistance Training   Training Prescription  Yes  Yes      Weight  4 lbs  4 lbs      Reps  10-15  10-15        Interval Training   Interval Training  No  No        Oxygen   Oxygen  Continuous  Continuous      Liters  6  6        Treadmill   MPH  -  1.5      Grade  -  0      Minutes  -  15      METs  -  2.15        NuStep   Level  6  6      Minutes  15  15      METs  2.1  2.3        Arm Ergometer   Level  2  2      Minutes  15  15      METs  2  2.1        Home Exercise Plan   Plans to continue exercise at  Home (comment) walking on treadmill  Home (comment) walking on treadmill      Frequency  Add 2 additional days to program exercise sessions.  Add 2 additional days to program exercise  sessions.      Initial Home Exercises Provided  01/28/17  01/28/17         Exercise Comments: Exercise Comments    Row Name 12/24/16 1135 01/26/17 0734         Exercise Comments  First full day of exercise!  Patient was oriented to gym and equipment including functions, settings, policies, and procedures.  Patient's individual exercise prescription and treatment plan were reviewed.  All starting workloads were established based on the results of the 6 minute walk test done at initial orientation visit.  The plan for exercise progression was also introduced and progression will be customized based on patient's performance and goals  First full day of exercise!  Patient was oriented to gym and equipment including functions, settings, policies, and procedures.  Patient's individual exercise prescription and treatment plan were reviewed.  All starting workloads were established based on the results of the 6 minute walk test done at initial orientation visit.  The plan for exercise progression was also introduced and progression will be customized based on patient's performance and goals.         Exercise Goals and Review: Exercise Goals    Row Name 12/20/16 1654 01/24/17 1402           Exercise Goals   Increase Physical Activity  Yes  Yes      Intervention  Provide advice, education, support and counseling about physical activity/exercise needs.;Develop an individualized exercise prescription for aerobic and resistive training based on initial evaluation findings, risk stratification, comorbidities and participant's personal goals.  Provide advice, education, support and counseling about physical  activity/exercise needs.;Develop an individualized exercise prescription for aerobic and resistive training based on initial evaluation findings, risk stratification, comorbidities and participant's personal goals.      Expected Outcomes  Achievement of increased cardiorespiratory fitness and enhanced  flexibility, muscular endurance and strength shown through measurements of functional capacity and personal statement of participant.  Achievement of increased cardiorespiratory fitness and enhanced flexibility, muscular endurance and strength shown through measurements of functional capacity and personal statement of participant.      Increase Strength and Stamina  Yes  Yes      Intervention  Provide advice, education, support and counseling about physical activity/exercise needs.;Develop an individualized exercise prescription for aerobic and resistive training based on initial evaluation findings, risk stratification, comorbidities and participant's personal goals.  Provide advice, education, support and counseling about physical activity/exercise needs.;Develop an individualized exercise prescription for aerobic and resistive training based on initial evaluation findings, risk stratification, comorbidities and participant's personal goals.      Expected Outcomes  Achievement of increased cardiorespiratory fitness and enhanced flexibility, muscular endurance and strength shown through measurements of functional capacity and personal statement of participant.  Achievement of increased cardiorespiratory fitness and enhanced flexibility, muscular endurance and strength shown through measurements of functional capacity and personal statement of participant.      Able to understand and use rate of perceived exertion (RPE) scale  Yes  Yes      Intervention  Provide education and explanation on how to use RPE scale  Provide education and explanation on how to use RPE scale      Expected Outcomes  Short Term: Able to use RPE daily in rehab to express subjective intensity level;Long Term:  Able to use RPE to guide intensity level when exercising independently  Short Term: Able to use RPE daily in rehab to express subjective intensity level;Long Term:  Able to use RPE to guide intensity level when exercising  independently      Able to understand and use Dyspnea scale  Yes  Yes      Intervention  Provide education and explanation on how to use Dyspnea scale  Provide education and explanation on how to use Dyspnea scale      Expected Outcomes  Short Term: Able to use Dyspnea scale daily in rehab to express subjective sense of shortness of breath during exertion;Long Term: Able to use Dyspnea scale to guide intensity level when exercising independently  Short Term: Able to use Dyspnea scale daily in rehab to express subjective sense of shortness of breath during exertion;Long Term: Able to use Dyspnea scale to guide intensity level when exercising independently      Knowledge and understanding of Target Heart Rate Range (THRR)  Yes  Yes      Intervention  Provide education and explanation of THRR including how the numbers were predicted and where they are located for reference  Provide education and explanation of THRR including how the numbers were predicted and where they are located for reference      Expected Outcomes  Short Term: Able to state/look up THRR;Long Term: Able to use THRR to govern intensity when exercising independently;Short Term: Able to use daily as guideline for intensity in rehab  Short Term: Able to state/look up THRR;Long Term: Able to use THRR to govern intensity when exercising independently;Short Term: Able to use daily as guideline for intensity in rehab      Able to check pulse independently  Yes  Yes      Intervention  Provide education and demonstration on how to check pulse in carotid and radial arteries.;Review the importance of being able to check your own pulse for safety during independent exercise  Provide education and demonstration on how to check pulse in carotid and radial arteries.;Review the importance of being able to check your own pulse for safety during independent exercise      Expected Outcomes  Long Term: Able to check pulse independently and accurately;Short  Term: Able to explain why pulse checking is important during independent exercise  Long Term: Able to check pulse independently and accurately;Short Term: Able to explain why pulse checking is important during independent exercise      Understanding of Exercise Prescription  Yes  Yes      Intervention  Provide education, explanation, and written materials on patient's individual exercise prescription  Provide education, explanation, and written materials on patient's individual exercise prescription      Expected Outcomes  Short Term: Able to explain program exercise prescription;Long Term: Able to explain home exercise prescription to exercise independently  Short Term: Able to explain program exercise prescription;Long Term: Able to explain home exercise prescription to exercise independently         Exercise Goals Re-Evaluation : Exercise Goals Re-Evaluation    Row Name 12/28/16 1611 01/12/17 1534 01/26/17 0734 01/28/17 0757 02/01/17 1324     Exercise Goal Re-Evaluation   Exercise Goals Review  Increase Strength and Stamina;Increase Physical Activity  Increase Strength and Stamina;Increase Physical Activity  Understanding of Exercise Prescription;Knowledge and understanding of Target Heart Rate Range (THRR);Able to understand and use rate of perceived exertion (RPE) scale  Increase Physical Activity;Able to understand and use Dyspnea scale;Understanding of Exercise Prescription;Increase Strength and Stamina;Knowledge and understanding of Target Heart Rate Range (THRR);Able to understand and use rate of perceived exertion (RPE) scale;Able to check pulse independently  Increase Physical Activity;Increase Strength and Stamina;Understanding of Exercise Prescription   Comments  Amjad has completed two full days of exercise in rehab.  He is off to a good start.  We will continue to monitor his progression.   Amjad has continued to do well in rehab.  He has been averaging about two days a week.  He would  get more out of it if his attendance was better.  He is up to level 4 on the NuStep.  We will continue to monitor his progress.  Reviewed RPE scale, THR and program prescription with pt today.  Pt voiced understanding and was given a copy of goals to take home.   Reviewed home exercise with pt today.  Pt plans to walk on treadmill at home for exercise.  Reviewed THR, pulse, RPE, sign and symptoms, NTG use, and when to call 911 or MD.  Also discussed weather considerations and indoor options.  Pt voiced understanding.  Amjad has made a smooth transition from Pulmonary to Cardiac Rehab.  He continues to desaturate on the treadmill, but we did get clearance to increase his oxygen to 6L on the treadmill and for exercise at home.  We will continue to monitor his progression.    Expected Outcomes  Short: Attend rehab classes regularly.  Long: Complete rehab in full this time.   Short: Attend class more regularly.  Long: Exercise at home independently.   Short: Use RPE daily to regulate intensity.  Long: Follow program prescription in THR.  Short: Add in one to two days of walking on treadmill at home.  Long: Make exercise part of regular routine  Short:  Try 6L of oxygen on the treadmill and monitor saturations.  Long: Continue to try to exercise more.    [place] Name 02/16/17 1510 03/01/17 1241 03/07/17 0751 03/16/17 1449 03/30/17 1516     Exercise Goal Re-Evaluation   Exercise Goals Review  Increase Physical Activity;Increase Strength and Stamina;Understanding of Exercise Prescription  Increase Physical Activity;Increase Strength and Stamina;Understanding of Exercise Prescription  Increase Physical Activity;Increase Strength and Stamina;Understanding of Exercise Prescription  -  Increase Physical Activity;Increase Strength and Stamina;Understanding of Exercise Prescription   Comments  Amjad has continud to do well in Cardiac Rehab.  He has been doing better on the treadmill at 6L versus 4L.  He is now up to 2.6 METs on  the NuStep. We will continue to monitor his progression.   Amjad has been doing well in rehab.  His saturations have been staying abover 90%!  He continues to need prompting to keep moving from piece to piece.  We will continue to monitor his progress.  Amjad has been doing well in rehab. He is starting to feel a little stronger.  He is using his treadmill at home as well as a recumbent bike for about 15 min each for a total of 5mn on the days he is not here.    Amjad has continued to do well in rehab.  He continues to get in his home exercise regularly.  He is up to level 6 on the NuStep.  We will continue to monitor his progression.   Amjad has been doing well in rehab.  He is now using 4 lbs weights and maintaining his step count on the NuStep.  We will continue to monitor his progress.    Expected Outcomes  Short: Continue to work on increasing workloads.  Long: Try to increase physical activity overall.   Short: Continue to increas workloads. Long: Continue to increase physcial activity.  Short: Continue to work on increasing workloads to improve stamina.  Long: Continue to exercise at home.  Short: Continue to work on improving stamina and strength.  Long: Continue to increase physical activity.   Short: Continue to try to increase treadmill if saturations can stay >90%.  Long: Continue to build strength and stamina.    RCurryName 04/14/17 1112 04/18/17 0851 04/26/17 1531         Exercise Goal Re-Evaluation   Exercise Goals Review  Increase Physical Activity;Increase Strength and Stamina;Understanding of Exercise Prescription  Increase Physical Activity;Increase Strength and Stamina;Understanding of Exercise Prescription  Increase Physical Activity;Increase Strength and Stamina;Understanding of Exercise Prescription     Comments  Amjad returned to rehab yesterday after being out with his stress test.  He was able to return without any problems and his saturations were good. We will continue to monitor  his progress. 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   Improved walk test by 72%!!  Amjad will be graduating next week!!  He is planning to continue to exercise by coming back to Pulmonary Rehab. He has done well in cardiac and improved his walk test by [id number]!       Expected Outcomes  Short: Improve 6MWT!  Long: Continue to exercise regularly.   -  Short: Graduate  Long: Continue to exericse in pulmonary rehab.         Discharge Exercise Prescription (Final Exercise Prescription Changes): Exercise Prescription Changes - 04/26/17 1500      Response to Exercise   Blood Pressure (Admit)  132/70  Blood Pressure (Exercise)  130/64    Blood Pressure (Exit)  134/60    Heart Rate (Admit)  86 bpm    Heart Rate (Exercise)  111 bpm    Heart Rate (Exit)  68 bpm    Oxygen Saturation (Exercise)  94 %    Oxygen Saturation (Exit)  94 %    Rating of Perceived Exertion (Exercise)  12    Symptoms  none    Duration  Continue with 45 min of aerobic exercise without signs/symptoms of physical distress.    Intensity  THRR unchanged      Progression   Progression  Continue to progress workloads to maintain intensity without signs/symptoms of physical distress.    Average METs  2.18      Resistance Training   Training Prescription  Yes    Weight  4 lbs    Reps  10-15      Interval Training   Interval Training  No      Oxygen   Oxygen  Continuous    Liters  6      Treadmill   MPH  1.5    Grade  0    Minutes  15    METs  2.15      NuStep   Level  6    Minutes  15    METs  2.3      Arm Ergometer   Level  2    Minutes  15    METs  2.1      Home Exercise Plan   Plans to continue exercise at  Home (comment) walking on treadmill    Frequency  Add 2 additional days to program exercise sessions.    Initial Home Exercises Provided  01/28/17       Nutrition:  Target Goals: Understanding of nutrition guidelines, daily intake of sodium '1500mg'$ , cholesterol '200mg'$ ,  calories 30% from fat and 7% or less from saturated fats, daily to have 5 or more servings of fruits and vegetables.  Biometrics: Pre Biometrics - 01/24/17 1403      Pre Biometrics   Height  5' 6.1" (1.679 m)    Weight  155 lb 9.6 oz (70.6 kg)    Waist Circumference  37.5 inches    Hip Circumference  36 inches    Waist to Hip Ratio  1.04 %    BMI (Calculated)  25.04    Single Leg Stand  18.09 seconds        Nutrition Therapy Plan and Nutrition Goals: Nutrition Therapy & Goals - 04/25/17 0833      Nutrition Therapy   Diet  TLC    Protein (specify units)  8oz    Fiber  30 grams    Saturated Fats  12 max. grams    Fruits and Vegetables  4 servings/day    Sodium  1500 grams      Personal Nutrition Goals   Nutrition Goal  Try Ensure supplement drinks, either by themselves or added to homemade shakes. 1-2/day to help restore weight.    Personal Goal #2  Add healthy fats to meals and snacks to provide additional calories    Personal Goal #3  Cook oatmeal with whole milk instead of water    Personal Goal #4  Eat 4-6 small meals/ snacks throughout the day rather than 3 larger meals      Intervention Plan   Intervention  Prescribe, educate and counsel regarding individualized specific dietary modifications aiming towards targeted core  components such as weight, hypertension, lipid management, diabetes, heart failure and other comorbidities.    Expected Outcomes  Short Term Goal: Understand basic principles of dietary content, such as calories, fat, sodium, cholesterol and nutrients.;Short Term Goal: A plan has been developed with personal nutrition goals set during dietitian appointment.;Long Term Goal: Adherence to prescribed nutrition plan.       Nutrition Assessments: Nutrition Assessments - 01/24/17 1338      MEDFICTS Scores   Pre Score  -- will bring in to first session       Nutrition Goals Re-Evaluation: Nutrition Goals Re-Evaluation    [place] Name 02/14/17 0914 03/07/17  0756 04/13/17 0800         Goals   Current Weight  155 lb 11.2 oz (70.6 kg)  155 lb (70.3 kg)  154 lb (69.9 kg)     Nutrition Goal  Maintain weight and intake more calories to improve strength.  Meet with dietician to discuss maintain weight and intake more calories to improve strength.    He wants to meet with the dietician still since he was not able to meet with her.     Comment  Amjad shows an interest in meeting with the dietician. Dietician Lattie Haw spoke to him about meeting next week. He states he is not as hungry as he used to be. He says he eats 2 meals a day. At dinner he eats alot of fruit. He does not get short of breath while eating.  Amjad would like to meet with dietician while in program.  Last week the weather kept him out but he would like to talk with her while here in class.   He just would like to get a few tweaks.   He states his eating is fair. His appitite is ok and is eating a heart healthy diet. Dietician appointment made for 04/25/17     Expected Outcome  Short: meet with the dietician. Long: Adhere to a diet plan to increase calorie intake.  Short: Meet with dietician.  Long: Follow recommendations.   Short: Meet with dietician.  Long: Follow recommendations for a diet plan.         Nutrition Goals Discharge (Final Nutrition Goals Re-Evaluation): Nutrition Goals Re-Evaluation - 04/13/17 0800      Goals   Current Weight  154 lb (69.9 kg)    Nutrition Goal  He wants to meet with the dietician still since he was not able to meet with her.    Comment  He states his eating is fair. His appitite is ok and is eating a heart healthy diet. Dietician appointment made for 04/25/17    Expected Outcome  Short: Meet with dietician.  Long: Follow recommendations for a diet plan.        Psychosocial: Target Goals: Acknowledge presence or absence of significant depression and/or stress, maximize coping skills, provide positive support system. Participant is able to verbalize types and  ability to use techniques and skills needed for reducing stress and depression.   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 He feels he can not do simple things  without being SOB or tired.       Family Dynamics   Good Support System?  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 His wife was with him today at his appointment and is supportive of him beginning the cardiac rehab program.       Barriers   Psychosocial barriers to participate in program  The patient should benefit from training in stress management and relaxation.      Screening Interventions   Interventions  Yes;Encouraged to exercise;Program counselor consult;To provide support and resources with identified psychosocial needs;Provide feedback about the scores to participant    Expected Outcomes  Short Term goal: Utilizing psychosocial counselor, staff and physician to assist with identification of specific Stressors or current issues interfering with healing process. Setting desired goal for each stressor or current issue identified.;Long Term Goal: Stressors or current issues are controlled or eliminated.;Short Term goal: Identification and review with participant of any Quality of Life or Depression concerns found by scoring the questionnaire.;Long Term goal: The participant improves quality of Life and PHQ9 Scores as seen by post scores and/or verbalization of changes       Quality of Life Scores:  Quality of Life - 01/24/17 1340      Quality of Life Scores   Health/Function Pre  17.11 %    Socioeconomic Pre  26 %     Psych/Spiritual Pre  18.57 %    Family Pre  29 %    GLOBAL Pre  21.11 %      Scores of 19 and below usually indicate a poorer quality of life in these areas.  A difference of  2-3 points is a clinically meaningful difference.  A difference of 2-3 points in the total score of the Quality of Life Index has been associated with significant improvement in overall quality of life, self-image, physical symptoms, and general health in studies assessing change in quality of life.  PHQ-9: Recent Review Flowsheet Data    Depression screen [facility] 2/9 01/26/2017 01/24/2017 12/20/2016 04/26/2016 12/24/2015   Decreased Interest '1 1 2 '$ 0 0   Down, Depressed, Hopeless '1 1 1 '$ 0 0   PHQ - 2 Score '2 2 3 '$ 0 0   Altered sleeping 0 3 0 - -   Tired, decreased energy '1 1 1 '$ - -   Change in appetite '2 3 1 '$ - -   Feeling bad or failure about yourself  0 3 1 - -   Trouble concentrating 0 0 0 - -   Moving slowly or fidgety/restless 0 0 0 - -   Suicidal thoughts 0 0 0 - -   PHQ-9 Score '5 12 6 '$ - -   Difficult doing work/chores Somewhat difficult Somewhat difficult Somewhat difficult - -     Interpretation of Total Score  Total Score Depression Severity:  1-4 = Minimal depression, 5-9 = Mild depression, 10-14 = Moderate depression, 15-19 = Moderately severe depression, 20-27 = Severe depression   Psychosocial Evaluation and Intervention: Psychosocial Evaluation - 01/26/17 0958      Psychosocial Evaluation & Interventions   Interventions  Encouraged to exercise with the program and follow exercise prescription    Comments  Counselor met with Mr. Rogue Jury today for initial psychosocial evaluation.  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  He has a strong support system with a spouse of 38 years and close  friends.   He is also actively involved in his local faith community.  Dr. B reports that he sleeps well typically and has a good appetite.  He is generally in a  positive mood and denies a history or current symptoms of depression or anxiety.  His PHQ-9 score was a "12" indicating moderate depression.  However, this counselor reviewed the individual items with him reporting "misunderstanding the values of the questions."  His revised score is a "5" indicating some mild symptoms are present - mostly relating to his energy level and limitations.  Dr. B states that his health is his primary stressor currently.  He has goals to get off of his Oxygen as much and increase his stamina and strength while in this program.  He plans to purchase some home equipment to help exercise more consistently.  Counselor will continue to follow with Mr. Rogue Jury.     Expected Outcomes  Dr. B will benefit from consistent exercise to achieve his stated goals.  The educational and psychoeducational components will be helpful in understanding and coping with his health condition.      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 He says other than his rental properties his stress levels are low. His wife is very supportive of his exercise program.  Amjad has been doing pretty good. He has opened up a little more being in rehab.  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  He is finding that he is getting out and about more, but is limited by the battery life in his POC.  He continues a strong support in his wife.  He has found rehab to be helpful and the exercise seems to be helping.   He went to [place] when he was out, did his taxes. He saw his son. He has  not been doing his home exercise since he has been out. He has been feeling short of breath and was taking it easy due to the procedures he had  while he has been away from [facility]. He states that he does not have much stress at the moment. He does not want to stay for stress management education.      Expected Outcomes  Short: continue to attend [facility]. Long: Maintain exercise to decrease stress.  Short: Continuet to stay postivie and keep stress low.  Long: Continue to manage disease process with postive attitude.   Short: Continuet to attend [facility] to keep stress low.  Long: Continue to exercise to decrease stress.     Interventions  Encouraged to attend Cardiac Rehabilitation for the exercise  Encouraged to attend Cardiac Rehabilitation for the exercise;Stress management education  Encouraged to attend Cardiac Rehabilitation for the exercise     Continue Psychosocial Services   Follow up required by staff  Follow up required by staff  Follow up required by staff       Initial Review   Source of Stress Concerns  -  Chronic Illness;Unable to perform yard/household activities  -  Psychosocial Discharge (Final Psychosocial Re-Evaluation): Psychosocial Re-Evaluation - 04/13/17 0810      Psychosocial Re-Evaluation   Current issues with  Current Stress Concerns    Comments  He went to [facility] when he was out, did his taxes. He saw his son. He has not been doing his home exercise since he has been out. He has been feeling short of breath and was taking it easy due to the procedures he had  while he has been away from [facility] [employer] He states that he does not have much stress at the moment. He does not want to stay for stress management education.     Expected Outcomes  Short: Continuet to attend [facility] to keep stress low.  Long: Continue to exercise to decrease stress.    Interventions  Encouraged to attend Cardiac Rehabilitation for the exercise    Continue Psychosocial Services    Follow up required by staff       Vocational Rehabilitation: Provide vocational rehab assistance to qualifying candidates.   Vocational Rehab Evaluation & Intervention: Vocational Rehab - 01/24/17 1303      Initial Vocational Rehab Evaluation & Intervention   Assessment shows need for Vocational Rehabilitation  No       Education: Education Goals: Education classes will be provided on a variety of topics geared toward better understanding of heart health and risk factor modification. Participant will state understanding/return demonstration of topics presented as noted by education test scores.  Learning Barriers/Preferences: Learning Barriers/Preferences - 01/24/17 1302      Learning Barriers/Preferences   Learning Barriers  Hearing has hearing aid but does not wear them often    Learning Preferences  Audio;Written Material       Education Topics:  AED/CPR: - Group verbal and written instruction with the use of models to demonstrate the basic use of the AED with the basic ABC's of resuscitation.   Pulmonary Rehab from 07/28/2015 in [facility] Cardiac and Pulmonary Rehab  Date  05/23/15  Educator  CE  Instruction Review Code (retired)  2- meets goals/outcomes      General Nutrition Guidelines/Fats and Fiber: -Group instruction provided by verbal, written material, models and posters to present the general guidelines for heart healthy nutrition. Gives an explanation and review of dietary fats and fiber.   Cardiac Rehab from 05/04/2017 in [facility] Cardiac and Pulmonary Rehab  Date  04/18/17  Educator  CR  Instruction Review Code  1- Verbalizes Understanding      Controlling Sodium/Reading Food Labels: -Group verbal and written material supporting the discussion of sodium use in heart healthy nutrition. Review and explanation with models, verbal and written materials for utilization of the food label.   Cardiac Rehab from 05/04/2017 in [facility] Cardiac and Pulmonary Rehab  Date  04/25/17   Educator  PI  Instruction Review Code  1- Verbalizes Understanding      Exercise Physiology & General Exercise Guidelines: - Group verbal and written instruction with models to review the exercise physiology of the cardiovascular system and associated critical values. Provides general exercise guidelines with specific guidelines to those with heart or lung disease.    Cardiac Rehab from 05/04/2017 in [facility] Cardiac and Pulmonary Rehab  Date  03/16/17  Educator  [facility]  Instruction Review Code  1- Verbalizes Understanding      Aerobic Exercise & Resistance Training: - Gives group verbal and written instruction on the various components of exercise. Focuses on aerobic and resistive training programs and the benefits of this training and how  to safely progress through these programs..   Cardiac Rehab from 05/04/2017 in [facility] Cardiac and Pulmonary Rehab  Date  03/23/17  Educator  [facility]  Instruction Review Code  1- Verbalizes Understanding      Flexibility, Balance, Mind/Body Relaxation: Provides group verbal/written instruction on the benefits of flexibility and balance training, including mind/body exercise modes such as yoga, pilates and tai chi.  Demonstration and skill practice provided.   Pulmonary Rehab from 07/28/2015 in [facility] Cardiac and Pulmonary Rehab  Date  05/07/15  Educator  RM  Instruction Review Code (retired)  2- meets goals/outcomes      Stress and Anxiety: - Provides group verbal and written instruction about the health risks of elevated stress and causes of high stress.  Discuss the correlation between heart/lung disease and anxiety and treatment options. Review healthy ways to manage with stress and anxiety.   Cardiac Rehab from 05/04/2017 in [facility] Cardiac and Pulmonary Rehab  Date  04/13/17  Educator  [facility]  Instruction Review Code  1- Verbalizes Understanding      Depression: - Provides group verbal and written instruction on the correlation between heart/lung disease and  depressed mood, treatment options, and the stigmas associated with seeking treatment.   Anatomy & Physiology of the Heart: - Group verbal and written instruction and models provide basic cardiac anatomy and physiology, with the coronary electrical and arterial systems. Review of Valvular disease and Heart Failure   Cardiac Rehab from 05/04/2017 in [facility] Cardiac and Pulmonary Rehab  Date  01/31/17  Educator  CE  Instruction Review Code  1- Verbalizes Understanding      Cardiac Procedures: - Group verbal and written instruction to review commonly prescribed medications for heart disease. Reviews the medication, class of the drug, and side effects. Includes the steps to properly store meds and maintain the prescription regimen. (beta blockers and nitrates)   Cardiac Medications I: - Group verbal and written instruction to review commonly prescribed medications for heart disease. Reviews the medication, class of the drug, and side effects. Includes the steps to properly store meds and maintain the prescription regimen.   Cardiac Rehab from 05/04/2017 in [facility] Cardiac and Pulmonary Rehab  Date  02/14/17  Educator  CE  Instruction Review Code  1- Verbalizes Understanding      Cardiac Medications II: -Group verbal and written instruction to review commonly prescribed medications for heart disease. Reviews the medication, class of the drug, and side effects. (all other drug classes)   Pulmonary Rehab from 01/10/2017 in [facility] Cardiac and Pulmonary Rehab  Date  12/31/16  Educator  [facility]  Instruction Review Code  1- Verbalizes Understanding       Go Sex-Intimacy & Heart Disease, Get SMART - Goal Setting: - Group verbal and written instruction through game format to discuss heart disease and the return to sexual intimacy. Provides group verbal and written material to discuss and apply goal setting through the application of the S.M.A.R.T. Method.   Other Matters of the Heart: - Provides group  verbal, written materials and models to describe Stable Angina and Peripheral Artery. Includes description of the disease process and treatment options available to the cardiac patient.   Cardiac Rehab from 05/04/2017 in [facility] Cardiac and Pulmonary Rehab  Date  01/31/17  Educator  CE  Instruction Review Code  1- Verbalizes Understanding      Exercise & Equipment Safety: - Individual verbal instruction and demonstration of equipment use and safety with use of the equipment.   Cardiac Rehab  from 05/04/2017 in [facility] Cardiac and Pulmonary Rehab  Date  01/24/17  Educator  KS  Instruction Review Code  1- Verbalizes Understanding      Infection Prevention: - Provides verbal and written material to individual with discussion of infection control including proper hand washing and proper equipment cleaning during exercise session.   Cardiac Rehab from 05/04/2017 in [facility] Cardiac and Pulmonary Rehab  Date  01/24/17  Educator  KS  Instruction Review Code  1- Verbalizes Understanding      Falls Prevention: - Provides verbal and written material to individual with discussion of falls prevention and safety.   Cardiac Rehab from 05/04/2017 in [facility] Cardiac and Pulmonary Rehab  Date  01/24/17  Educator  KS  Instruction Review Code  1- Verbalizes Understanding      Diabetes: - Individual verbal and written instruction to review signs/symptoms of diabetes, desired ranges of glucose level fasting, after meals and with exercise. Acknowledge that pre and post exercise glucose checks will be done for 3 sessions at entry of program.   Know Your Numbers and Risk Factors: -Group verbal and written instruction about important numbers in your health.  Discussion of what are risk factors and how they play a role in the disease process.  Review of Cholesterol, Blood Pressure, Diabetes, and BMI and the role they play in your overall health.   Pulmonary Rehab from 01/10/2017 in [facility] Cardiac and Pulmonary Rehab   Date  12/31/16  Educator  [facility]  Instruction Review Code  1- Verbalizes Understanding      Sleep Hygiene: -Provides group verbal and written instruction about how sleep can affect your health.  Define sleep hygiene, discuss sleep cycles and impact of sleep habits. Review good sleep hygiene tips.    Cardiac Rehab from 05/04/2017 in [facility] Cardiac and Pulmonary Rehab  Date  04/27/17  Educator  [facility]  Instruction Review Code  1- Verbalizes Understanding      Other: -Provides group and verbal instruction on various topics (see comments)   Knowledge Questionnaire Score: Knowledge Questionnaire Score - 01/24/17 1353      Knowledge Questionnaire Score   Pre Score  24/28 Reviewed correct answeres with patient who verbalied understanding.       Core Components/Risk Factors/Patient Goals at Admission: Personal Goals and Risk Factors at Admission - 01/24/17 1335      Core Components/Risk Factors/Patient Goals on Admission    Weight Management  Yes;Weight Maintenance    Intervention  Weight Management: Develop a combined nutrition and exercise program designed to reach desired caloric intake, while maintaining appropriate intake of nutrient and fiber, sodium and fats, and appropriate energy expenditure required for the weight goal.;Weight Management: Provide education and appropriate resources to help participant work on and attain dietary goals.;Weight Management/Obesity: Establish reasonable short term and long term weight goals.    Admit Weight  155 lb 9.6 oz (70.6 kg)    Goal Weight: Short Term  150 lb (68 kg)    Goal Weight: Long Term  150 lb (68 kg)    Expected Outcomes  Weight Maintenance: Understanding of the daily nutrition guidelines, which includes 25-35% calories from fat, 7% or less cal from saturated fats, less than '200mg'$  cholesterol, less than 1.5gm of sodium, & 5 or more servings of fruits and vegetables daily;Long Term: Adherence to nutrition and physical activity/exercise program  aimed toward attainment of established weight goal;Short Term: Continue to assess and modify interventions until short term weight is achieved;Understanding recommendations for meals to include 15-35%  energy as protein, 25-35% energy from fat, 35-60% energy from carbohydrates, less than '200mg'$  of dietary cholesterol, 20-35 gm of total fiber daily;Understanding of distribution of calorie intake throughout the day with the consumption of 4-5 meals/snacks    Improve shortness of breath with ADL's  Yes Patient would like to be able to walk and do things around the house without getting so short of breath    Intervention  Provide education, individualized exercise plan and daily activity instruction to help decrease symptoms of SOB with activities of daily living.    Expected Outcomes  Short Term: Achieves a reduction of symptoms when performing activities of daily living.    Develop more efficient breathing techniques such as purse lipped breathing and diaphragmatic breathing; and practicing self-pacing with activity  Yes    Intervention  Provide education, demonstration and support about specific breathing techniuqes utilized for more efficient breathing. Include techniques such as pursed lipped breathing, diaphragmatic breathing and self-pacing activity.    Expected Outcomes  Short Term: Participant will be able to demonstrate and use breathing techniques as needed throughout daily activities.    Increase knowledge of respiratory medications and ability to use respiratory devices properly   Yes    Intervention  Provide education and demonstration as needed of appropriate use of medications, inhalers, and oxygen therapy.    Expected Outcomes  Short Term: Achieves understanding of medications use. Understands that oxygen is a medication prescribed by physician. Demonstrates appropriate use of inhaler and oxygen therapy.    Hypertension  Yes    Intervention  Provide education on lifestyle modifcations including  regular physical activity/exercise, weight management, moderate sodium restriction and increased consumption of fresh fruit, vegetables, and low fat dairy, alcohol moderation, and smoking cessation.;Monitor prescription use compliance.    Expected Outcomes  Short Term: Continued assessment and intervention until BP is < 140/47m HG in hypertensive participants. < 130/893mHG in hypertensive participants with diabetes, heart failure or chronic kidney disease.;Long Term: Maintenance of blood pressure at goal levels.    Lipids  Yes    Intervention  Provide education and support for participant on nutrition & aerobic/resistive exercise along with prescribed medications to achieve LDL '70mg'$ , HDL >'40mg'$ .    Expected Outcomes  Short Term: Participant states understanding of desired cholesterol values and is compliant with medications prescribed. Participant is following exercise prescription and nutrition guidelines.;Long Term: Cholesterol controlled with medications as prescribed, with individualized exercise RX and with personalized nutrition plan. Value goals: LDL < '70mg'$ , HDL > 40 mg.    Stress  Yes    Intervention  Offer individual and/or small group education and counseling on adjustment to heart disease, stress management and health-related lifestyle change. Teach and support self-help strategies.;Refer participants experiencing significant psychosocial distress to appropriate mental health specialists for further evaluation and treatment. When possible, include family members and significant others in education/counseling sessions.    Expected Outcomes  Short Term: Participant demonstrates changes in health-related behavior, relaxation and other stress management skills, ability to obtain effective social support, and compliance with psychotropic medications if prescribed.;Long Term: Emotional wellbeing is indicated by absence of clinically significant psychosocial distress or social isolation.       Core  Components/Risk Factors/Patient Goals Review:  Goals and Risk Factor Review    Row Name 02/16/17 1513 03/07/17 0753 04/13/17 0747         Core Components/Risk Factors/Patient Goals Review   Personal Goals Review  Hypertension;Improve shortness of breath with ADL's;Stress;Weight Management/Obesity;Lipids  Hypertension;Improve shortness of breath  with ADL's;Weight Management/Obesity;Lipids  Hypertension;Improve shortness of breath with ADL's;Weight Management/Obesity;Lipids     Review  Amjad states that his mood has been better since he has more energy. Being in [place] has made him more talkitive. When he walks from one end of the house to the other he still gets short of breath but not as bad as it was a few months ago.  Amjad's weight has been steady at 155lbs.  He continues to have more energy.  Blood pressures have been good around 120s/70s and he continues to check them at home. He does not feel that his shortness of breath is getting much better as he is still using 3L at rest and 4L for exercise.  He is able to do a little more, but usually finds that the battery life of his POC is limiting the amount of his physcial activity.   His shortness of breath has been feeling worse since he has been out for a little while. His blood pressure has been stable and has been taking his medications regularly.  His weight has been maintained. He weights 154 pounds. Amjad states his cholesterol has been good. He is going to try to finihs out [facility] since he is feeling better. He states after [facility]Track that he wants to come back to the [place] program.     Expected Outcomes  Short: attend [facility] to decrease stress. Long Continue to exercise to minimize stress.  Short: Continue to work on his SOB with exercise.  Long: Continue to work on risk factor modificiations.   Short: graduate [place]. Long: come back to Pulmonary rehab.        Core Components/Risk Factors/Patient Goals at Discharge (Final  Review):  Goals and Risk Factor Review - 04/13/17 0747      Core Components/Risk Factors/Patient Goals Review   Personal Goals Review  Hypertension;Improve shortness of breath with ADL's;Weight Management/Obesity;Lipids    Review  His shortness of breath has been feeling worse since he has been out for a little while. His blood pressure has been stable and has been taking his medications regularly.  His weight has been maintained. He weights 154 pounds. Amjad states his cholesterol has been good. He is going to try to finihs out [facility] since he is feeling better. He states after [facility]Track that he wants to come back to the [place] program.    Expected Outcomes  Short: graduate [place]. Long: come back to Pulmonary rehab.       ITP Comments: ITP Comments    Row Name 12/20/16 1638 01/24/17 1324 01/26/17 0639 01/31/17 0942 02/23/17 0541   ITP Comments  Medical Evaluation completed. Chart sent to Dr. Emily Filbert Director of [facility]. Medical Diagnosis can be found in [facility] encounter 12/20/16  Medical Review Completed; initial ITP created. Diagnosis Documentation can be found in [facility] note dated 01/20/2017.  30 day review. Continue with ITP unless directed changes per Medical Director review.  New to program  Order received to increase oxygen flow to 6L for exercise.   30 day review. Continue with ITP unless directed changes per Medical Director review.    [place] Name 03/23/17 8841 04/08/17 0926 04/11/17 0948 04/20/17 0551     ITP Comments  30 day review. Continue with ITP unless directed changes per Medical Director review.   Amjad has been out this week as he had a stress test scheduled.  His test was normal and he is now cleared to return to rehab.   Amjad has been  after seeking a stress test after he saw his saturations dropping one day.  His stress test was negative and he was cleared to return to rehab. He waited to hear back from office about straightening out his medications.  He hopes to  return on Wednesday.   30 Day review. Continue with ITP unless directed changes per Medical Director review.       Comments: Discharge ITP

## 2017-05-04 NOTE — Progress Notes (Signed)
Discharge Progress Report  Patient Details  Name: Vincent Kennedy MRN: [id number] Date of Birth: 07-05-1932 Referring Provider:     Cardiac Rehab from 01/24/2017 in [facility] Cardiac and Pulmonary Rehab  Referring Provider  Kathlyn Sacramento MD       Number of Visits: 36/36  Reason for Discharge:  Patient reached a stable level of exercise. Patient independent in their exercise. Patient has met program and personal goals.  Smoking History:  Social History   Tobacco Use  Smoking Status Never Smoker  Smokeless Tobacco Never Used    Diagnosis:  Status post coronary artery stent placement  ADL UCSD: Pulmonary Assessment Scores    Row Name 12/20/16 1538         ADL UCSD   ADL Phase  Entry     SOB Score total  48     Rest  1     Walk  2     Stairs  3     Bath  1     Dress  0     Shop  2       CAT Score   CAT Score  22        Initial Exercise Prescription: Initial Exercise Prescription - 01/24/17 1400      Date of Initial Exercise RX and Referring Provider   Date  01/24/17    Referring Provider  Kathlyn Sacramento MD      Oxygen   Oxygen  Continuous    Liters  4      Treadmill   MPH  1.5    Grade  0    Minutes  15    METs  2.15      NuStep   Level  1    SPM  80    Minutes  15    METs  1.7      Arm Ergometer   Level  1    RPM  25    Minutes  15    METs  1.7      Prescription Details   Frequency (times per week)  3    Duration  Progress to 45 minutes of aerobic exercise without signs/symptoms of physical distress      Intensity   THRR 40-80% of Max Heartrate  101-126    Ratings of Perceived Exertion  11-13    Perceived Dyspnea  0-4      Progression   Progression  Continue to progress workloads to maintain intensity without signs/symptoms of physical distress.      Resistance Training   Training Prescription  Yes    Weight  3 lbs    Reps  10-15       Discharge Exercise Prescription (Final Exercise Prescription Changes): Exercise  Prescription Changes - 04/26/17 1500      Response to Exercise   Blood Pressure (Admit)  132/70    Blood Pressure (Exercise)  130/64    Blood Pressure (Exit)  134/60    Heart Rate (Admit)  86 bpm    Heart Rate (Exercise)  111 bpm    Heart Rate (Exit)  68 bpm    Oxygen Saturation (Exercise)  94 %    Oxygen Saturation (Exit)  94 %    Rating of Perceived Exertion (Exercise)  12    Symptoms  none    Duration  Continue with 45 min of aerobic exercise without signs/symptoms of physical distress.    Intensity  THRR unchanged      Progression  Progression  Continue to progress workloads to maintain intensity without signs/symptoms of physical distress.    Average METs  2.18      Resistance Training   Training Prescription  Yes    Weight  4 lbs    Reps  10-15      Interval Training   Interval Training  No      Oxygen   Oxygen  Continuous    Liters  6      Treadmill   MPH  1.5    Grade  0    Minutes  15    METs  2.15      NuStep   Level  6    Minutes  15    METs  2.3      Arm Ergometer   Level  2    Minutes  15    METs  2.1      Home Exercise Plan   Plans to continue exercise at  Home (comment) walking on treadmill    Frequency  Add 2 additional days to program exercise sessions.    Initial Home Exercises Provided  01/28/17       Functional Capacity: 6 Minute Walk    Row Name 12/20/16 1634 01/24/17 1305 04/18/17 0847     6 Minute Walk   Phase  Initial  Initial  Discharge   Distance  760 feet  250 feet  430 feet   Distance % Change  -  -  72 %   Distance Feet Change  -  -  180 ft   Walk Time  4.4 minutes stopped test due to desaturations  1.63 minutes  2.68 minutes   # of Rest Breaks  1 at end of test  1 test stopped at 1:38 for desaturation  1 test stopped for fatigue and desaturation   MPH  1.96  1.74  1.79   METS  1.93  0.94  0.88   RPE  '14  13  15   '$ Perceived Dyspnea   '3  2  3   '$ VO2 Peak  6.76  3.29  3.07   Symptoms  Yes (comment)  Yes (comment)  wearing concentrator on back  Yes (comment)   Comments  SOB  SOB  SOB, fatigue   Resting HR  73 bpm  77 bpm  85 bpm   Resting BP  126/64  148/84  144/64   Resting Oxygen Saturation   94 %  97 %  94 %   Exercise Oxygen Saturation  during 6 min walk  79 %  73 %  79 %   Max Ex. HR  119 bpm  122 bpm  95 bpm   Max Ex. BP  154/74  152/64  146/64   2 Minute Post BP  156/64 recheck 146/72  144/70  -     Interval HR   1 Minute HR  73  80  93   2 Minute HR  116  122 test stopped at 1:38  93   3 Minute HR  104  -  86   4 Minute HR  116  66  -   5 Minute HR  119 stopped at 4:24 due desaturation to 79%  -  -   2 Minute Post HR  80  -  -   Interval Heart Rate?  Yes  Yes  Yes     Interval Oxygen   Interval Oxygen?  Yes  Yes  Yes  Baseline Oxygen Saturation %  94 %  97 %  94 %   1 Minute Oxygen Saturation %  92 %  80 %  88 %   1 Minute Liters of Oxygen  4 L pulsed  4 L pulsed  4 L   2 Minute Oxygen Saturation %  83 %  73 % test stopped at 1:38 desaturation at 75%  80 % 2:41 seated 79%   2 Minute Liters of Oxygen  4 L  4 L  4 L   3 Minute Oxygen Saturation %  81 %  90 %  80 %   3 Minute Liters of Oxygen  4 L  4 L  6 L increased to 6L to recover   4 Minute Oxygen Saturation %  80 % 79% at 4:24  94 %  90 %   4 Minute Liters of Oxygen  4 L  4 L  6 L   2 Minute Post Oxygen Saturation %  92 %  -  -   2 Minute Post Liters of Oxygen  4 L  -  -      Psychological, QOL, Others - Outcomes: PHQ 2/9: Depression screen [facility] 2/9 01/26/2017 01/24/2017 12/20/2016 04/26/2016 12/24/2015  Decreased Interest '1 1 2 '$ 0 0  Down, Depressed, Hopeless '1 1 1 '$ 0 0  PHQ - 2 Score '2 2 3 '$ 0 0  Altered sleeping 0 3 0 - -  Tired, decreased energy '1 1 1 '$ - -  Change in appetite '2 3 1 '$ - -  Feeling bad or failure about yourself  0 3 1 - -  Trouble concentrating 0 0 0 - -  Moving slowly or fidgety/restless 0 0 0 - -  Suicidal thoughts 0 0 0 - -  PHQ-9 Score '5 12 6 '$ - -  Difficult doing work/chores Somewhat difficult Somewhat  difficult Somewhat difficult - -    Quality of Life: Quality of Life - 01/24/17 1340      Quality of Life Scores   Health/Function Pre  17.11 %    Socioeconomic Pre  26 %    Psych/Spiritual Pre  18.57 %    Family Pre  29 %    GLOBAL Pre  21.11 %       Personal Goals: Goals established at orientation with interventions provided to work toward goal. Personal Goals and Risk Factors at Admission - 01/24/17 1335      Core Components/Risk Factors/Patient Goals on Admission    Weight Management  Yes;Weight Maintenance    Intervention  Weight Management: Develop a combined nutrition and exercise program designed to reach desired caloric intake, while maintaining appropriate intake of nutrient and fiber, sodium and fats, and appropriate energy expenditure required for the weight goal.;Weight Management: Provide education and appropriate resources to help participant work on and attain dietary goals.;Weight Management/Obesity: Establish reasonable short term and long term weight goals.    Admit Weight  155 lb 9.6 oz (70.6 kg)    Goal Weight: Short Term  150 lb (68 kg)    Goal Weight: Long Term  150 lb (68 kg)    Expected Outcomes  Weight Maintenance: Understanding of the daily nutrition guidelines, which includes 25-35% calories from fat, 7% or less cal from saturated fats, less than '200mg'$  cholesterol, less than 1.5gm of sodium, & 5 or more servings of fruits and vegetables daily;Long Term: Adherence to nutrition and physical activity/exercise program aimed toward attainment of established weight goal;Short Term: Continue to assess and  modify interventions until short term weight is achieved;Understanding recommendations for meals to include 15-35% energy as protein, 25-35% energy from fat, 35-60% energy from carbohydrates, less than '200mg'$  of dietary cholesterol, 20-35 gm of total fiber daily;Understanding of distribution of calorie intake throughout the day with the consumption of 4-5 meals/snacks     Improve shortness of breath with ADL's  Yes Patient would like to be able to walk and do things around the house without getting so short of breath    Intervention  Provide education, individualized exercise plan and daily activity instruction to help decrease symptoms of SOB with activities of daily living.    Expected Outcomes  Short Term: Achieves a reduction of symptoms when performing activities of daily living.    Develop more efficient breathing techniques such as purse lipped breathing and diaphragmatic breathing; and practicing self-pacing with activity  Yes    Intervention  Provide education, demonstration and support about specific breathing techniuqes utilized for more efficient breathing. Include techniques such as pursed lipped breathing, diaphragmatic breathing and self-pacing activity.    Expected Outcomes  Short Term: Participant will be able to demonstrate and use breathing techniques as needed throughout daily activities.    Increase knowledge of respiratory medications and ability to use respiratory devices properly   Yes    Intervention  Provide education and demonstration as needed of appropriate use of medications, inhalers, and oxygen therapy.    Expected Outcomes  Short Term: Achieves understanding of medications use. Understands that oxygen is a medication prescribed by physician. Demonstrates appropriate use of inhaler and oxygen therapy.    Hypertension  Yes    Intervention  Provide education on lifestyle modifcations including regular physical activity/exercise, weight management, moderate sodium restriction and increased consumption of fresh fruit, vegetables, and low fat dairy, alcohol moderation, and smoking cessation.;Monitor prescription use compliance.    Expected Outcomes  Short Term: Continued assessment and intervention until BP is < 140/11m HG in hypertensive participants. < 130/828mHG in hypertensive participants with diabetes, heart failure or chronic kidney  disease.;Long Term: Maintenance of blood pressure at goal levels.    Lipids  Yes    Intervention  Provide education and support for participant on nutrition & aerobic/resistive exercise along with prescribed medications to achieve LDL '70mg'$ , HDL >'40mg'$ .    Expected Outcomes  Short Term: Participant states understanding of desired cholesterol values and is compliant with medications prescribed. Participant is following exercise prescription and nutrition guidelines.;Long Term: Cholesterol controlled with medications as prescribed, with individualized exercise RX and with personalized nutrition plan. Value goals: LDL < '70mg'$ , HDL > 40 mg.    Stress  Yes    Intervention  Offer individual and/or small group education and counseling on adjustment to heart disease, stress management and health-related lifestyle change. Teach and support self-help strategies.;Refer participants experiencing significant psychosocial distress to appropriate mental health specialists for further evaluation and treatment. When possible, include family members and significant others in education/counseling sessions.    Expected Outcomes  Short Term: Participant demonstrates changes in health-related behavior, relaxation and other stress management skills, ability to obtain effective social support, and compliance with psychotropic medications if prescribed.;Long Term: Emotional wellbeing is indicated by absence of clinically significant psychosocial distress or social isolation.        Personal Goals Discharge: Goals and Risk Factor Review    Row Name 02/16/17 1513 03/07/17 0753 04/13/17 0747         Core Components/Risk Factors/Patient Goals Review   Personal Goals Review  Hypertension;Improve shortness of breath with ADL's;Stress;Weight Management/Obesity;Lipids  Hypertension;Improve shortness of breath with ADL's;Weight Management/Obesity;Lipids  Hypertension;Improve shortness of breath with ADL's;Weight  Management/Obesity;Lipids     Review  Amjad states that his mood has been better since he has more energy. Being in [place] has made him more talkitive. When he walks from one end of the house to the other he still gets short of breath but not as bad as it was a few months ago.  Amjad's weight has been steady at 155lbs.  He continues to have more energy.  Blood pressures have been good around 120s/70s and he continues to check them at home. He does not feel that his shortness of breath is getting much better as he is still using 3L at rest and 4L for exercise.  He is able to do a little more, but usually finds that the battery life of his POC is limiting the amount of his physcial activity.   His shortness of breath has been feeling worse since he has been out for a little while. His blood pressure has been stable and has been taking his medications regularly.  His weight has been maintained. He weights 154 pounds. Amjad states his cholesterol has been good. He is going to try to finihs out [facility] since he is feeling better. He states after [facility] that he wants to come back to the [place] program.     Expected Outcomes  Short: attend [facility] to decrease stress. Long Continue to exercise to minimize stress.  Short: Continue to work on his SOB with exercise.  Long: Continue to work on risk factor modificiations.   Short: graduate [place]. Long: come back to Pulmonary rehab.        Exercise Goals and Review: Exercise Goals    Row Name 12/20/16 1654 01/24/17 1402           Exercise Goals   Increase Physical Activity  Yes  Yes      Intervention  Provide advice, education, support and counseling about physical activity/exercise needs.;Develop an individualized exercise prescription for aerobic and resistive training based on initial evaluation findings, risk stratification, comorbidities and participant's personal goals.  Provide advice, education, support and counseling about physical  activity/exercise needs.;Develop an individualized exercise prescription for aerobic and resistive training based on initial evaluation findings, risk stratification, comorbidities and participant's personal goals.      Expected Outcomes  Achievement of increased cardiorespiratory fitness and enhanced flexibility, muscular endurance and strength shown through measurements of functional capacity and personal statement of participant.  Achievement of increased cardiorespiratory fitness and enhanced flexibility, muscular endurance and strength shown through measurements of functional capacity and personal statement of participant.      Increase Strength and Stamina  Yes  Yes      Intervention  Provide advice, education, support and counseling about physical activity/exercise needs.;Develop an individualized exercise prescription for aerobic and resistive training based on initial evaluation findings, risk stratification, comorbidities and participant's personal goals.  Provide advice, education, support and counseling about physical activity/exercise needs.;Develop an individualized exercise prescription for aerobic and resistive training based on initial evaluation findings, risk stratification, comorbidities and participant's personal goals.      Expected Outcomes  Achievement of increased cardiorespiratory fitness and enhanced flexibility, muscular endurance and strength shown through measurements of functional capacity and personal statement of participant.  Achievement of increased cardiorespiratory fitness and enhanced flexibility, muscular endurance and strength shown through measurements of functional capacity and personal statement of participant.  Able to understand and use rate of perceived exertion (RPE) scale  Yes  Yes      Intervention  Provide education and explanation on how to use RPE scale  Provide education and explanation on how to use RPE scale      Expected Outcomes  Short Term: Able to  use RPE daily in rehab to express subjective intensity level;Long Term:  Able to use RPE to guide intensity level when exercising independently  Short Term: Able to use RPE daily in rehab to express subjective intensity level;Long Term:  Able to use RPE to guide intensity level when exercising independently      Able to understand and use Dyspnea scale  Yes  Yes      Intervention  Provide education and explanation on how to use Dyspnea scale  Provide education and explanation on how to use Dyspnea scale      Expected Outcomes  Short Term: Able to use Dyspnea scale daily in rehab to express subjective sense of shortness of breath during exertion;Long Term: Able to use Dyspnea scale to guide intensity level when exercising independently  Short Term: Able to use Dyspnea scale daily in rehab to express subjective sense of shortness of breath during exertion;Long Term: Able to use Dyspnea scale to guide intensity level when exercising independently      Knowledge and understanding of Target Heart Rate Range (THRR)  Yes  Yes      Intervention  Provide education and explanation of THRR including how the numbers were predicted and where they are located for reference  Provide education and explanation of THRR including how the numbers were predicted and where they are located for reference      Expected Outcomes  Short Term: Able to state/look up THRR;Long Term: Able to use THRR to govern intensity when exercising independently;Short Term: Able to use daily as guideline for intensity in rehab  Short Term: Able to state/look up THRR;Long Term: Able to use THRR to govern intensity when exercising independently;Short Term: Able to use daily as guideline for intensity in rehab      Able to check pulse independently  Yes  Yes      Intervention  Provide education and demonstration on how to check pulse in carotid and radial arteries.;Review the importance of being able to check your own pulse for safety during independent  exercise  Provide education and demonstration on how to check pulse in carotid and radial arteries.;Review the importance of being able to check your own pulse for safety during independent exercise      Expected Outcomes  Long Term: Able to check pulse independently and accurately;Short Term: Able to explain why pulse checking is important during independent exercise  Long Term: Able to check pulse independently and accurately;Short Term: Able to explain why pulse checking is important during independent exercise      Understanding of Exercise Prescription  Yes  Yes      Intervention  Provide education, explanation, and written materials on patient's individual exercise prescription  Provide education, explanation, and written materials on patient's individual exercise prescription      Expected Outcomes  Short Term: Able to explain program exercise prescription;Long Term: Able to explain home exercise prescription to exercise independently  Short Term: Able to explain program exercise prescription;Long Term: Able to explain home exercise prescription to exercise independently         Nutrition & Weight - Outcomes: Pre Biometrics - 01/24/17 1403  Pre Biometrics   Height  5' 6.1" (1.679 m)    Weight  155 lb 9.6 oz (70.6 kg)    Waist Circumference  37.5 inches    Hip Circumference  36 inches    Waist to Hip Ratio  1.04 %    BMI (Calculated)  25.04    Single Leg Stand  18.09 seconds        Nutrition: Nutrition Therapy & Goals - 04/25/17 0833      Nutrition Therapy   Diet  TLC    Protein (specify units)  8oz    Fiber  30 grams    Saturated Fats  12 max. grams    Fruits and Vegetables  4 servings/day    Sodium  1500 grams      Personal Nutrition Goals   Nutrition Goal  Try Ensure supplement drinks, either by themselves or added to homemade shakes. 1-2/day to help restore weight.    Personal Goal #2  Add healthy fats to meals and snacks to provide additional calories    Personal  Goal #3  Cook oatmeal with whole milk instead of water    Personal Goal #4  Eat 4-6 small meals/ snacks throughout the day rather than 3 larger meals      Intervention Plan   Intervention  Prescribe, educate and counsel regarding individualized specific dietary modifications aiming towards targeted core components such as weight, hypertension, lipid management, diabetes, heart failure and other comorbidities.    Expected Outcomes  Short Term Goal: Understand basic principles of dietary content, such as calories, fat, sodium, cholesterol and nutrients.;Short Term Goal: A plan has been developed with personal nutrition goals set during dietitian appointment.;Long Term Goal: Adherence to prescribed nutrition plan.       Nutrition Discharge: Nutrition Assessments - 01/24/17 1338      MEDFICTS Scores   Pre Score  -- will bring in to first session       Education Questionnaire Score: Knowledge Questionnaire Score - 01/24/17 1353      Knowledge Questionnaire Score   Pre Score  24/28 Reviewed correct answeres with patient who verbalied understanding.       Goals reviewed with patient; copy given to patient.

## 2017-05-04 NOTE — Progress Notes (Signed)
Daily Session Note  Patient Details  Name: Vincent Kennedy MRN: [id number] Date of Birth: 1933-02-10 Referring Provider:     Cardiac Rehab from 01/24/2017 in [facility] Cardiac and Pulmonary Rehab  Referring Provider  Kathlyn Sacramento MD      Encounter Date: 05/04/2017  Check In: Session Check In - 05/04/17 0745      Check-In   Location  [facility]-Cardiac & Pulmonary Rehab    Staff Present  Alberteen Sam, MA, RCEP, CCRP, Exercise Physiologist;Susanne Bice, RN, BSN, CCRP;Sorin Frimpong Flavia Shipper    Supervising physician immediately available to respond to emergencies  See telemetry face sheet for immediately available ER MD    Medication changes reported      No    Fall or balance concerns reported     No    Warm-up and Cool-down  Performed on first and last piece of equipment    Resistance Training Performed  Yes    VAD Patient?  No      Pain Assessment   Currently in Pain?  No/denies          Social History   Tobacco Use  Smoking Status Never Smoker  Smokeless Tobacco Never Used    Goals Met:  Independence with exercise equipment Exercise tolerated well No report of cardiac concerns or symptoms Strength training completed today  Goals Unmet:  Not Applicable  Comments:  Harinder graduated today from cardiac rehab with 36 sessions completed.  Details of the patient's exercise prescription and what He needs to do in order to continue the prescription and progress were discussed with patient.  Patient was given a copy of prescription and goals.  Patient verbalized understanding.  San plans to continue to exercise by joining [employer] or [employer].   Dr. Emily Filbert is Medical Director for [place] and [name].

## 2017-05-11 ENCOUNTER — Other Ambulatory Visit: Payer: Self-pay | Admitting: *Deleted

## 2017-05-11 MED ORDER — METOPROLOL TARTRATE 25 MG PO TABS: 25.0000 mg | ORAL_TABLET | Freq: Two times a day (BID) | ORAL | 3 refills | Status: DC

## 2017-05-11 MED ORDER — FINASTERIDE 5 MG PO TABS: 5.0000 mg | ORAL_TABLET | Freq: Every day | ORAL | 3 refills | Status: DC

## 2017-05-11 MED ORDER — CLOPIDOGREL BISULFATE 75 MG PO TABS: 75.0000 mg | ORAL_TABLET | Freq: Every day | ORAL | 3 refills | Status: DC

## 2017-05-11 MED ORDER — AMLODIPINE BESYLATE 2.5 MG PO TABS: 2.5000 mg | ORAL_TABLET | Freq: Every day | ORAL | 3 refills | Status: DC

## 2017-05-12 ENCOUNTER — Other Ambulatory Visit: Payer: Self-pay

## 2017-05-12 MED ORDER — ROSUVASTATIN CALCIUM 40 MG PO TABS: ORAL_TABLET | ORAL | 3 refills | Status: DC

## 2017-05-13 ENCOUNTER — Telehealth: Payer: Self-pay | Admitting: Cardiovascular Disease

## 2017-05-13 NOTE — Telephone Encounter (Signed)
Called patient to let him know these have been sent In and see if there was another pharmacy he would like me to send these to so he will not go without but there was no answer.

## 2017-05-13 NOTE — Telephone Encounter (Signed)
°*  STAT* If patient is at the pharmacy, call can be transferred to refill team.   1. Which medications need to be refilled? (please list name of each medication and dose if known)  Plavix  Crestor   2. Which pharmacy/location (including street and city if local pharmacy) is medication to be sent to? [facility]   3. Do they need a 30 day or 90 day supply? 90 day

## 2017-05-13 NOTE — Telephone Encounter (Signed)
Looks like both medications have already been sent in. Will call patient to see if there is a different pharmacy patietn would like me to send these prescriptions to so he will not go without.   Medication Detail    Disp Refills Start End   clopidogrel (PLAVIX) 75 MG tablet 90 tablet 3 05/11/2017    Sig - Route: Take 1 tablet (75 mg total) by mouth daily. - Oral   Sent to pharmacy as: clopidogrel (PLAVIX) 75 MG tablet   E-Prescribing Status: Receipt confirmed by pharmacy (05/11/2017  4:33 PM EST)   Pharm[facility]   [facility] [place] [id number]   rosuvastatin (CRESTOR) 40 MG tablet 90 tablet 3 05/12/2017    Sig: Take 40 mg by mouth every day   Sent to pharmacy as: rosuvastatin (CRESTOR) 40 MG tablet   E-Prescribing Status: Receipt confirmed by pharmacy (05/12/2017  3:50 PM EST)   [facility] [place] [phone]

## 2017-05-13 NOTE — Telephone Encounter (Signed)
°*  STAT* If patient is at the pharmacy, call can be transferred to refill team.   1. Which medications need to be refilled? (please list name of each medication and dose if known)      Crestor 40 mg po q d    plavix  75 mg po q d  2. Which pharmacy/location (including street and city if local pharmacy) is medication to be sent to? [facility] [id number]  3. Do they need a 30 day or 90 day supply? 90  Patient is out of both meds

## 2017-05-19 DIAGNOSIS — J9601 Acute respiratory failure with hypoxia: Secondary | ICD-10-CM | POA: Diagnosis not present

## 2017-05-19 DIAGNOSIS — G4733 Obstructive sleep apnea (adult) (pediatric): Secondary | ICD-10-CM | POA: Diagnosis not present

## 2017-06-01 ENCOUNTER — Other Ambulatory Visit: Payer: Self-pay

## 2017-06-01 ENCOUNTER — Ambulatory Visit: Payer: Self-pay | Admitting: Internal Medicine

## 2017-06-01 MED ORDER — BUDESONIDE 0.5 MG/2ML IN SUSP: 0.5000 mg | Freq: Two times a day (BID) | RESPIRATORY_TRACT | 3 refills | Status: DC

## 2017-06-02 ENCOUNTER — Ambulatory Visit: Payer: Self-pay | Admitting: Internal Medicine

## 2017-06-15 DIAGNOSIS — J9601 Acute respiratory failure with hypoxia: Secondary | ICD-10-CM | POA: Diagnosis not present

## 2017-06-17 ENCOUNTER — Telehealth: Payer: Self-pay

## 2017-06-17 NOTE — Telephone Encounter (Signed)
[facility] increase oxygen to 6 liters.  Pt needs new concentrator for home.  dbs

## 2017-06-18 DIAGNOSIS — G4733 Obstructive sleep apnea (adult) (pediatric): Secondary | ICD-10-CM | POA: Diagnosis not present

## 2017-06-27 ENCOUNTER — Ambulatory Visit (INDEPENDENT_AMBULATORY_CARE_PROVIDER_SITE_OTHER): Payer: Medicare Other | Admitting: Internal Medicine

## 2017-06-27 ENCOUNTER — Encounter: Payer: Self-pay | Admitting: Internal Medicine

## 2017-06-27 VITALS — BP 118/68 | HR 81 | Resp 16 | Ht 65.5 in | Wt 150.8 lb

## 2017-06-27 DIAGNOSIS — G4733 Obstructive sleep apnea (adult) (pediatric): Secondary | ICD-10-CM | POA: Diagnosis not present

## 2017-06-27 DIAGNOSIS — J9611 Chronic respiratory failure with hypoxia: Secondary | ICD-10-CM

## 2017-06-27 DIAGNOSIS — K219 Gastro-esophageal reflux disease without esophagitis: Secondary | ICD-10-CM

## 2017-06-27 DIAGNOSIS — J8410 Pulmonary fibrosis, unspecified: Secondary | ICD-10-CM

## 2017-06-27 DIAGNOSIS — Z9981 Dependence on supplemental oxygen: Secondary | ICD-10-CM | POA: Diagnosis not present

## 2017-06-27 NOTE — Patient Instructions (Signed)
Pulmonary Fibrosis °Pulmonary fibrosis is a type of lung disease that causes scarring. Over time, the scar tissue (fibrosis) builds up in the air sacs of your lungs. This makes it hard for you to breathe. Less oxygen can get into your blood. °Scarring from pulmonary fibrosis gets worse over time. This damage is permanent. Having damaged lungs may make it more likely that you will have heart problems as well. °What are the causes? °Usually, the cause of pulmonary fibrosis is not known (idiopathic pulmonary fibrosis). However, pulmonary fibrosis can be caused by: °· Exposure to occupational and environmental toxins. These include asbestos, silica, and metal dusts. °· Inhaling moldy hay. This can cause an allergic reaction in the lung (farmer's lung) that can lead to pulmonary fibrosis. °· Inhaling toxic fumes. °· Certain medicines. These include drugs used in radiation therapy or used to treat seizures, heart problems, and some infections. °· Autoimmune diseases, such as rheumatoid arthritis, systemic sclerosis, and connective tissue diseases. °· Sarcoidosis. In this disease, areas of inflammatory cells (granulomas) form and most often affect the lungs. °· Genes. Some cases of pulmonary fibrosis may be passed down through families. ° °What increases the risk? °You may be at a higher risk for developing pulmonary fibrosis if: °· You have a family history of the disease. °· You have an autoimmune disease or another condition linked to pulmonary fibrosis. °· You are exposed to certain substances or fumes found in agricultural, farm, construction, or factory work. °· You take certain medicines. ° °What are the signs or symptoms? °Symptoms may include: °· Difficulty breathing that gets worse with activity. °· Dry, hacking cough. °· Rapid, shallow breathing during exercise or while at rest. °· Shortness of breath that gets worse (dyspnea). °· Bluish skin and lips. °· Loss of appetite. °· Loss of strength. °· Weight loss and  fatigue. °· Rounded and enlarged fingertips (clubbing). ° °How is this diagnosed? °Your health care provider may suspect pulmonary fibrosis based on your symptoms and medical history. Diagnosis may include a physical exam. Your health care provider will check for signs that strongly suggest that you have pulmonary fibrosis, such as: °· Blue skin around your fingernails or mouth from reduced oxygen. °· Clubbing around the ends of your fingers. °· A crackling sound when you breathe. ° °Your health care provider may also do tests to confirm the diagnosis. These may include: °· Looking inside your lungs with an instrument (bronchoscopy). °· Imaging studies of your lungs and heart using: °? X-rays. °? CT scan. °? Sound waves (echocardiogram). °· Tests to measure how well you are breathing (pulmonary function tests). °· Exercise testing to see how well your lungs work while you are walking. °· Blood tests. °· A procedure to remove a small piece of lung tissue to examine in a lab (biopsy). ° °How is this treated? °There is no cure for pulmonary fibrosis. Treatments focus on managing symptoms and preventing scarring from getting worse. This can include: °· Medicines. °? You may take steroids to prevent permanent lung changes. Your health care provider may put you on a high dose at first, then on lower dosages for the long term. °? Medicines to suppress your body’s defense (immune) system. These can have serious side effects. °· You may be monitored with X-rays and laboratory work. °· Oxygen therapy may be helpful if oxygen in your blood is low. °· Surgery. In some cases, a lung transplant is an option. ° °Follow these instructions at home: °· Take medicines only as   directed by your health care provider. °· Keep your vaccinations up to date as recommended by your health care provider. °· Do not use any tobacco products, including cigarettes, chewing tobacco, or electronic cigarettes. If you need help quitting, ask your  health care provider. °· Get regular exercise, but do not overexert yourself. Ask your health care provider to suggest some activities that are safe for you to do. Walking and chair exercises can help if you have physical limitations. °· Consider joining a pulmonary rehabilitation program or a support group for people with pulmonary fibrosis. °· Eat small meals often so you do not get too full. Overeating can make breathing trouble worse. °· Maintain a healthy weight. Lose weight if you need to. °· Do breathing exercises as directed by your health care provider. °· Keep all follow-up visits as directed by your health care provider. This is important. °Contact a health care provider if: °· You are not able to be as active as usual. °· You have a long-lasting (chronic) cough. °· You are often short of breath. °· You have a fever or chills. °Get help right away if: °· You have chest pain. °· Your breathing is much worse. °· You cannot take a deep breath. °· You have blue skin around your mouth or fingers. °· You have clubbing of your fingers. °· You cough up mucus that is dark in color. °· You have a lot of headaches. °· You get very confused or sleepy. °This information is not intended to replace advice given to you by your health care provider. Make sure you discuss any questions you have with your health care provider. °Document Released: 05/29/2003 Document Revised: 08/14/2015 Document Reviewed: 08/16/2013 °Elsevier Interactive Patient Education © 2018 [employer] ° °

## 2017-06-27 NOTE — Progress Notes (Signed)
[facility] [address] [place], [place] [address]  Pulmonary Sleep Medicine   Office Visit Note  Patient Name: Vincent Kennedy DOB: 09-22-1932 MRN [id number]  Date of Service: 06/27/2017  Complaints/HPI: Patient is here for follow-up of pulmonary fibrosis.  He is doing fairly well has noted that he has been requiring increasing oxygen at nighttime when he sleeps.  He states he wakes up in turned his oxygen up to 4 L.  He has not actually had an overnight oximetry done and it is suggested that we get this.  As far as his breathing is concerned he is going to physical therapy has been in pulmonary rehab in this has actually helped him quite a bit.  In addition he is more active walking around his neighborhood with his wife which has shown that he is improving as far as his exercise tolerance is concerned.  ROS  General: (-) fever, (-) chills, (-) night sweats, (-) weakness Skin: (-) rashes, (-) itching,. Eyes: (-) visual changes, (-) redness, (-) itching. Nose and Sinuses: (-) nasal stuffiness or itchiness, (-) postnasal drip, (-) nosebleeds, (-) sinus trouble. Mouth and Throat: (-) sore throat, (-) hoarseness. Neck: (-) swollen glands, (-) enlarged thyroid, (-) neck pain. Respiratory: - cough, (-) bloody sputum, + shortness of breath, - wheezing. Cardiovascular: - ankle swelling, (-) chest pain. Lymphatic: (-) lymph node enlargement. Neurologic: (-) numbness, (-) tingling. Psychiatric: (-) anxiety, (-) depression   Current Medication: Outpatient Encounter Medications as of 06/27/2017  Medication Sig  . amLODipine (NORVASC) 2.5 MG tablet Take 1 tablet (2.5 mg total) by mouth daily.  [name]. aspirin EC 81 MG tablet Take 1 tablet (81 mg total) by mouth daily.  . budesonide (PULMICORT) 0.5 MG/2ML nebulizer solution Take 2 mLs (0.5 mg total) by nebulization 2 (two) times daily.  . chlorproMAZINE (THORAZINE) 25 MG tablet Take 1 tablet (25 mg total) by mouth 3 (three) times daily  as needed for hiccoughs.  . clopidogrel (PLAVIX) 75 MG tablet Take 1 tablet (75 mg total) by mouth daily.  . finasteride (PROSCAR) 5 MG tablet Take 1 tablet (5 mg total) by mouth daily.  . metoprolol tartrate (LOPRESSOR) 25 MG tablet Take 1 tablet (25 mg total) by mouth 2 (two) times daily.  . mometasone-formoterol (DULERA) 100-5 MCG/ACT AERO Inhale 2 puffs into the lungs 2 (two) times daily.  . nitroGLYCERIN (NITROSTAT) 0.4 MG SL tablet Place 1 tablet (0.4 mg total) under the tongue every 5 (five) minutes as needed for chest pain.  . pantoprazole (PROTONIX) 40 MG tablet Take 1 tablet (40 mg total) by mouth daily.  . Pirfenidone (ESBRIET) 267 MG CAPS Take 801 mg by mouth 3 (three) times daily.   . rivastigmine (EXELON) 9.5 mg/24hr Place 1 patch (9.5 mg total) onto the skin daily. (Patient taking differently: Place 9.5 mg onto the skin once a week. )  . rosuvastatin (CRESTOR) 40 MG tablet Take 40 mg by mouth every day  . zolpidem (AMBIEN) 10 MG tablet Take 1 tablet (10 mg total) by mouth at bedtime as needed for sleep.   No facility-administered encounter medications on file as of 06/27/2017.     Surgical History: Past Surgical History:  Procedure Laterality Date  . CATARACT EXTRACTION W/PHACO Right 01/29/2015   Procedure: CATARACT EXTRACTION PHACO AND INTRAOCULAR LENS PLACEMENT (IOC);  Surgeon: [name], MD;  [facility];  Service: Ophthalmology;  Laterality: Right;  RESTOR LENS CPAP  . CATARACT EXTRACTION W/PHACO Left 03/12/2015   Procedure:  CATARACT EXTRACTION PHACO AND INTRAOCULAR LENS PLACEMENT (IOC);  Surgeon: Lockie Mola, MD;  Location: [facility];  Service: Ophthalmology;  Laterality: Left;  RESTOR SHUGARCAINE  . COLONOSCOPY WITH ESOPHAGOGASTRODUODENOSCOPY (EGD)    . CORONARY ANGIOPLASTY WITH STENT PLACEMENT  2010   "1 stent"  . CORONARY ANGIOPLASTY WITH STENT PLACEMENT  01/19/2017  . CORONARY ATHERECTOMY N/A 01/19/2017   Procedure:  CORONARY ATHERECTOMY - CSI;  Surgeon: Iran Ouch, MD;  Location: [facility];  Service: Cardiovascular;  Laterality: N/A;  unable to cross lesion  . CORONARY STENT INTERVENTION N/A 01/19/2017   Procedure: CORONARY STENT INTERVENTION;  Surgeon: Iran Ouch, MD;  Location: [facility];  Service: Cardiovascular;  Laterality: N/A;  . INGUINAL HERNIA REPAIR Bilateral   . RIGHT/LEFT HEART CATH AND CORONARY ANGIOGRAPHY N/A 01/12/2017   Procedure: RIGHT/LEFT HEART CATH AND CORONARY ANGIOGRAPHY;  Surgeon: Iran Ouch, MD;  Location: [facility];  Service: Cardiovascular;  Laterality: N/A;    Medical History: Past Medical History:  Diagnosis Date  . Abnormal nuclear cardiac imaging test    Dr. [name]  . BPH (benign prostatic hyperplasia)   . Coronary artery disease    Cardiac cath September 2008: 20% left main stenosis, 40% mid LAD stenosis, 99% ostial first diagonal stenosis in a large branch and 90% ostial disease in second diagonal but the vessel was very small, 20% mid RCA stenosis with normal ejection fraction. Successful angioplasty and drug-eluting stent placement to the ostial first diagonal with a 2.5 x 15 mm Xience stent  . GERD (gastroesophageal reflux disease)   . Hyperlipidemia   . Hypertension    BP has never been high(per pt).  Metoprolol is to keep BP low because of stent (afterload)  . On home oxygen therapy    "2-3L; 24/7" (01/19/2017)  . OSA on CPAP   . Oxygen deficiency   . Pneumonia 07/2016  . Pulmonary fibrosis (HCC) 08/26/2015  . Shortness of breath dyspnea    with exertion  . Wears hearing aid    (sometimes)    Family History: Family History  Problem Relation Age of Onset  . CAD Father   . CAD Brother     Social History: Social History   Socioeconomic History  . Marital status: Married    Spouse name: Not on file  . Number of children: Not on file  . Years of education: Not on file  . Highest education level: Not on  file  Occupational History  . Occupation: retired  Engineer, production  . Financial resource strain: Not on file  . Food insecurity:    Worry: Not on file    Inability: Not on file  . Transportation needs:    Medical: Not on file    Non-medical: Not on file  Tobacco Use  . Smoking status: Never Smoker  . Smokeless tobacco: Never Used  Substance and Sexual Activity  . Alcohol use: No    Alcohol/week: 0.0 oz  . Drug use: No  . Sexual activity: Yes    Comment: patient states minor  Lifestyle  . Physical activity:    Days per week: Not on file    Minutes per session: Not on file  . Stress: Not on file  Relationships  . Social connections:    Talks on phone: Not on file    Gets together: Not on file    Attends religious service: Not on file    Active member of club or organization:  Not on file    Attends meetings of clubs or organizations: Not on file    Relationship status: Not on file  . Intimate partner violence:    Fear of current or ex partner: Not on file    Emotionally abused: Not on file    Physically abused: Not on file    Forced sexual activity: Not on file  Other Topics Concern  . Not on file  Social History Narrative  . Not on file    Vital Signs: Blood pressure 118/68, pulse 81, resp. rate 16, height 5' 5.5" (1.664 m), weight 150 lb 12.8 oz (68.4 kg), SpO2 95 %.  Examination: General Appearance: The patient is well-developed, well-nourished, and in no distress. Skin: Gross inspection of skin unremarkable. Head: normocephalic, no gross deformities. Eyes: no gross deformities noted. ENT: ears appear grossly normal no exudates. Neck: Supple. No thyromegaly. No LAD. Respiratory: no rhonchi noted. Cardiovascular: Normal S1 and S2 without murmur or rub. Extremities: No cyanosis. pulses are equal. Neurologic: Alert and oriented. No involuntary movements.  LABS: Recent Results (from the past 2160 hour(s))  NM Myocar Multi W/Spect W/Wall Motion / EF     Status:  None   Collection Time: 04/05/17 11:33 AM  Result Value Ref Range   Rest HR 61 bpm   Rest BP 150/90 mmHg   Percent HR 64 %   Peak HR 89 bpm   Peak BP 136/64 mmHg   TID 0.89    LV sys vol 5 mL   LV dias vol 27 62 - 150 mL    Radiology: Nm Myocar Multi W/spect W/wall Motion / Ef  Result Date: 04/05/2017  Normal pharmacologic myocardial perfusion stress test without ischemia or scar.  The left ventricular ejection fraction is hyperdynamic (>65%).  This is a low risk study.     No results found.  No results found.    Assessment and Plan: Patient Active Problem List   Diagnosis Date Noted  . Effort angina (HCC) 01/19/2017  . CAD S/P percutaneous coronary angioplasty 11/25/2016  . Cataracts, bilateral 11/25/2016  . Chronic hypoxemic respiratory failure (HCC) 11/25/2016  . GERD (gastroesophageal reflux disease) 11/25/2016  . Inguinal hernia 11/25/2016  . OSA on CPAP 11/25/2016  . Urinary retention   . Pneumonia 08/15/2016  . Need for vaccination for Strep pneumoniae 04/28/2016  . Incomplete emptying of bladder 09/10/2015  . Pulmonary fibrosis (HCC) 08/26/2015  . Medication monitoring encounter 08/26/2015  . Elevated antinuclear antibody (ANA) level 04/11/2015  . Dyspnea 03/18/2015  . Coronary artery disease involving native coronary artery of native heart without angina pectoris 10/16/2014  . Auditory disturbance 10/16/2014  . Hypertension goal BP (blood pressure) < 150/90 10/16/2014  . Alzheimer's dementia without behavioral disturbance 10/16/2014  . Status post insertion of drug eluting coronary artery stent 10/16/2014  . Medicare annual wellness visit, subsequent 10/16/2014  . Coronary artery disease   . Hyperlipidemia     1. Interstital fibrosis  Pulmonary fibrosis will continue with oxygen therapy continue with present management.  He also follows with [facility] service and he will continue to keep that relationship.  We will continue to monitor  closely 2. OSA on CPAP which will be continued.  Also I have suggested that we do the overnight oximetry on CPAP at his present flow rate 3. Chronic respiratory Failure with hypoxia patient has ongoing requirements for oxygen which will be continued 4. GERD well controlled at this time we will continue to monitor  General Counseling: I  have discussed the findings of the evaluation and examination with [facility].  I have also discussed any further diagnostic evaluation thatmay be needed or ordered today. Marvis verbalizes understanding of the findings of todays visit. We also reviewed his medications today and discussed drug interactions and side effects including but not limited excessive drowsiness and altered mental states. We also discussed that there is always a risk not just to him but also people around him. he has been encouraged to call the office with any questions or concerns that should arise related to todays visit.    Time spent:  I have personally obtained a history, examined the patient, evaluated laboratory and imaging results, formulated the assessment and plan and placed orders.    Yevonne Pax, MD [facility] Pulmonary and Critical Care Sleep medicine

## 2017-06-29 ENCOUNTER — Telehealth: Payer: Self-pay | Admitting: Internal Medicine

## 2017-06-29 NOTE — Telephone Encounter (Signed)
Faxed [facility] oximetry test order/[place]

## 2017-07-06 ENCOUNTER — Telehealth: Payer: Self-pay

## 2017-07-07 ENCOUNTER — Telehealth: Payer: Self-pay

## 2017-07-07 NOTE — Telephone Encounter (Signed)
Faxed rx [facility] [place] [facility]  dbs

## 2017-07-07 NOTE — Telephone Encounter (Signed)
Faxed order for oxygen concentrator for [place] [facility]

## 2017-07-16 DIAGNOSIS — J9601 Acute respiratory failure with hypoxia: Secondary | ICD-10-CM | POA: Diagnosis not present

## 2017-07-19 DIAGNOSIS — G4733 Obstructive sleep apnea (adult) (pediatric): Secondary | ICD-10-CM | POA: Diagnosis not present

## 2017-07-25 ENCOUNTER — Telehealth: Payer: Self-pay | Admitting: Internal Medicine

## 2017-07-25 NOTE — Telephone Encounter (Signed)
Dr Welton Flakes has reviewed overnight pulse test and pt should continue on his oxygen with cpap at 3 1/2 liters while sleeping.  Vincent Kennedy

## 2017-08-03 DIAGNOSIS — R0602 Shortness of breath: Secondary | ICD-10-CM | POA: Diagnosis not present

## 2017-08-03 DIAGNOSIS — K219 Gastro-esophageal reflux disease without esophagitis: Secondary | ICD-10-CM | POA: Diagnosis not present

## 2017-08-03 DIAGNOSIS — J84112 Idiopathic pulmonary fibrosis: Secondary | ICD-10-CM | POA: Diagnosis not present

## 2017-08-03 DIAGNOSIS — R05 Cough: Secondary | ICD-10-CM | POA: Diagnosis not present

## 2017-08-03 DIAGNOSIS — I2510 Atherosclerotic heart disease of native coronary artery without angina pectoris: Secondary | ICD-10-CM | POA: Diagnosis not present

## 2017-08-15 DIAGNOSIS — J9601 Acute respiratory failure with hypoxia: Secondary | ICD-10-CM | POA: Diagnosis not present

## 2017-08-22 ENCOUNTER — Telehealth: Payer: Self-pay | Admitting: Cardiovascular Disease

## 2017-08-22 NOTE — Telephone Encounter (Signed)
Routing to Dr Kirke CorinArida for advice on where we can schedule the patient.

## 2017-08-22 NOTE — Telephone Encounter (Signed)
Patient calling for 2 wk fu please advise.

## 2017-08-23 NOTE — Telephone Encounter (Signed)
lmov to schedule patient 6/14 with Kirke Corinarida

## 2017-08-25 NOTE — Telephone Encounter (Signed)
Scheduled for 6/14 with Dr. Kirke CorinArida

## 2017-09-02 ENCOUNTER — Telehealth: Payer: Self-pay

## 2017-09-02 ENCOUNTER — Encounter: Payer: Self-pay | Admitting: Cardiovascular Disease

## 2017-09-02 ENCOUNTER — Ambulatory Visit (INDEPENDENT_AMBULATORY_CARE_PROVIDER_SITE_OTHER): Payer: Medicare Other | Admitting: Cardiovascular Disease

## 2017-09-02 VITALS — BP 128/70 | HR 64 | Ht 67.0 in | Wt 146.0 lb

## 2017-09-02 DIAGNOSIS — E7800 Pure hypercholesterolemia, unspecified: Secondary | ICD-10-CM | POA: Diagnosis not present

## 2017-09-02 DIAGNOSIS — I2510 Atherosclerotic heart disease of native coronary artery without angina pectoris: Secondary | ICD-10-CM

## 2017-09-02 DIAGNOSIS — I10 Essential (primary) hypertension: Secondary | ICD-10-CM | POA: Diagnosis not present

## 2017-09-02 DIAGNOSIS — I25118 Atherosclerotic heart disease of native coronary artery with other forms of angina pectoris: Secondary | ICD-10-CM

## 2017-09-02 MED ORDER — RIVASTIGMINE 9.5 MG/24HR TD PT24: 9.5000 mg | MEDICATED_PATCH | Freq: Every day | TRANSDERMAL | 2 refills | Status: DC

## 2017-09-02 NOTE — Patient Instructions (Signed)
Medication Instructions:  Your physician recommends that you continue on your current medications as directed. Please refer to the Current Medication list given to you today.   Labwork: Your physician recommends that you return for lab work in: TODAY (BMET, CBC).  Testing/Procedures: Your physician has requested that you have a LEFT cardiac catheterization. Cardiac catheterization is used to diagnose and/or treat various heart conditions. Doctors may recommend this procedure for a number of different reasons. The most common reason is to evaluate chest pain. Chest pain can be a symptom of coronary artery disease (CAD), and cardiac catheterization can show whether plaque is narrowing or blocking your heart's arteries. This procedure is also used to evaluate the valves, as well as measure the blood flow and oxygen levels in different parts of your heart. For further information please visit [email or web address] Please follow instruction sheet, as given.   [facility] Cardiac Cath Instructions   You are scheduled for a Cardiac Cath on:___06/24/19____  Please arrive at _09:30__am on the day of your procedure  Please expect a call from our [facility] to pre-register you  Do not eat/drink anything after midnight  Someone will need to drive you home  It is recommended someone be with you for the first 24 hours after your procedure  Wear clothes that are easy to get on/off and wear slip on shoes if possible   Medications bring a current list of all medications with you  _YES_ You may take all of your medications the morning of your procedure with enough water to swallow safely    Day of your procedure: Arrive at the [facility] entrance.  Free valet service is available.  After entering the [facility] please check-in at the registration desk (1st desk on your right) to receive your armband. After receiving your armband someone will escort you to the cardiac cath/special  procedures waiting area.  The usual length of stay after your procedure is about 2 to 3 hours.  This can vary.  If you have any questions, please call our office at [phone], or you may call the cardiac cath lab at [facility] directly at [phone]    Follow-Up: Your physician recommends that you schedule a follow-up appointment in: 2 MONTHS WITH DR ARIDA.   If you need a refill on your cardiac medications before your next appointment, please call your pharmacy.    Coronary Angiogram With Stent Coronary angiogram with stent placement is a procedure to widen or open a narrow blood vessel of the heart (coronary artery). Arteries may become blocked by cholesterol buildup (plaques) in the lining of the wall. When a coronary artery becomes partially blocked, blood flow to that area decreases. This may lead to chest pain or a heart attack (myocardial infarction). A stent is a small piece of metal that looks like mesh or a spring. Stent placement may be done as treatment for a heart attack or right after a coronary angiogram in which a blocked artery is found. Let your health care provider know about:  Any allergies you have.  All medicines you are taking, including vitamins, herbs, eye drops, creams, and over-the-counter medicines.  Any problems you or family members have had with anesthetic medicines.  Any blood disorders you have.  Any surgeries you have had.  Any medical conditions you have.  Whether you are pregnant or may be pregnant. What are the risks? Generally, this is a safe procedure. However, problems may occur, including:  Damage to the heart or its  blood vessels.  A return of blockage.  Bleeding, infection, or bruising at the insertion site.  A collection of blood under the skin (hematoma) at the insertion site.  A blood clot in another part of the body.  Kidney injury.  Allergic reaction to the dye or contrast that is used.  Bleeding into the abdomen  (retroperitoneal bleeding).  What happens before the procedure? Staying hydrated Follow instructions from your health care provider about hydration, which may include:  Up to 2 hours before the procedure - you may continue to drink clear liquids, such as water, clear fruit juice, black coffee, and plain tea.  Eating and drinking restrictions Follow instructions from your health care provider about eating and drinking, which may include:  8 hours before the procedure - stop eating heavy meals or foods such as meat, fried foods, or fatty foods.  6 hours before the procedure - stop eating light meals or foods, such as toast or cereal.  2 hours before the procedure - stop drinking clear liquids.  Ask your health care provider about:  Changing or stopping your regular medicines. This is especially important if you are taking diabetes medicines or blood thinners.  Taking medicines such as ibuprofen. These medicines can thin your blood. Do not take these medicines before your procedure if your health care provider instructs you not to. Generally, aspirin is recommended before a procedure of passing a small, thin tube (catheter) through a blood vessel and into the heart (cardiac catheterization).  What happens during the procedure?  An IV tube will be inserted into one of your veins.  You will be given one or more of the following: ? A medicine to help you relax (sedative). ? A medicine to numb the area where the catheter will be inserted into an artery (local anesthetic).  To reduce your risk of infection: ? Your health care team will wash or sanitize their hands. ? Your skin will be washed with soap. ? Hair may be removed from the area where the catheter will be inserted.  Using a guide wire, the catheter will be inserted into an artery. The location may be in your groin, in your wrist, or in the fold of your arm (near your elbow).  A type of X-ray (fluoroscopy) will be used to help  guide the catheter to the opening of the arteries in the heart.  A dye will be injected into the catheter, and X-rays will be taken. The dye will help to show where any narrowing or blockages are located in the arteries.  A tiny wire will be guided to the blocked spot, and a balloon will be inflated to make the artery wider.  The stent will be expanded and will crush the plaques into the wall of the vessel. The stent will hold the area open and improve the blood flow. Most stents have a drug coating to reduce the risk of the stent narrowing over time.  The artery may be made wider using a drill, laser, or other tools to remove plaques.  When the blood flow is better, the catheter will be removed. The lining of the artery will grow over the stent, which stays where it was placed. This procedure may vary among health care providers and hospitals. What happens after the procedure?  If the procedure is done through the leg, you will be kept in bed lying flat for about 6 hours. You will be instructed to not bend and not cross your legs.  The insertion site will be checked frequently.  The pulse in your foot or wrist will be checked frequently.  You may have additional blood tests, X-rays, and a test that records the electrical activity of your heart (electrocardiogram, or ECG). This information is not intended to replace advice given to you by your health care provider. Make sure you discuss any questions you have with your health care provider. Document Released: 09/12/2002 Document Revised: 11/06/2015 Document Reviewed: 10/12/2015 Elsevier Interactive Patient Education  [employer].

## 2017-09-02 NOTE — Telephone Encounter (Signed)
Patient was provided 2 boxes of samples for Trulicity 1.5 per [email or web address]

## 2017-09-02 NOTE — H&P (View-Only) (Signed)
Cardiology Office Note   Date:  09/02/2017   ID:  Vincent Kennedy, DOB Oct 05, 1932, MRN [id number]  PCP:  [facility]  Cardiologist:   Vincent Bears, Vincent Kennedy   Chief Complaint  Patient presents with  . other    2 week follow up. Meds reviewed by the pt. verbally. "doing well."       History of Present Illness: Vincent Kennedy is a 84 y.o. male who is here today for follow-up visit regarding coronary artery disease. He is a retired Development worker, [employer].  He had previous first diagonal ostial drug-eluting stent placement in 2008. He was diagnosed with pulmonary fibrosis in 2016. He is on continuous oxygen therapy.  He had worsening exertional dyspnea and underwent a right and left cardiac catheterization in October of 2018 which showed severe one-vessel coronary artery disease with patent diagonal stent, 95% heavily calcified stenosis in the mid LAD at the origin of the second diagonal and moderate proximal LAD stenosis.  Ejection fraction was normal.  Right heart catheterization showed normal filling pressures, mild pulmonary hypertension and normal cardiac output.  Saturation on showed evidence of a step up at the atrial level suggestive of a possible ASD.  However, IVC sat was not obtained which could have affected the results. I performed successful angioplasty and drug-eluting stent placement to the proximal and mid LAD.  He has done well initially but then started having recurrent shortness of breath similar to before PCI.  He underwent a nuclear stress test in January of this year which showed no evidence of ischemia with normal ejection fraction.  He feels similar to how he felt before most recent PCI.  no chest pain.  Exertional palpitations improved after switching carvedilol to metoprolol.   Past Medical History:  Diagnosis Date  . Abnormal nuclear cardiac imaging test    Dr. [name]  . BPH (benign prostatic hyperplasia)   . Coronary artery disease    Cardiac  cath September 2008: 20% left main stenosis, 40% mid LAD stenosis, 99% ostial first diagonal stenosis in a large branch and 90% ostial disease in second diagonal but the vessel was very small, 20% mid RCA stenosis with normal ejection fraction. Successful angioplasty and drug-eluting stent placement to the ostial first diagonal with a 2.5 x 15 mm Xience stent  . GERD (gastroesophageal reflux disease)   . Hyperlipidemia   . Hypertension    BP has never been high(per pt).  Metoprolol is to keep BP low because of stent (afterload)  . On home oxygen therapy    "2-3L; 24/7" (01/19/2017)  . OSA on CPAP   . Oxygen deficiency   . Pneumonia 07/2016  . Pulmonary fibrosis (HCC) 08/26/2015  . Shortness of breath dyspnea    with exertion  . Wears hearing aid    (sometimes)    Past Surgical History:  Procedure Laterality Date  . CATARACT EXTRACTION W/PHACO Right 01/29/2015   Procedure: CATARACT EXTRACTION PHACO AND INTRAOCULAR LENS PLACEMENT (IOC);  Surgeon: Lockie Mola, Vincent Kennedy;  Location: [facility];  Service: Ophthalmology;  Laterality: Right;  RESTOR LENS CPAP  . CATARACT EXTRACTION W/PHACO Left 03/12/2015   Procedure: CATARACT EXTRACTION PHACO AND INTRAOCULAR LENS PLACEMENT (IOC);  Surgeon: Lockie Mola, Vincent Kennedy;  Location: [facility];  Service: Ophthalmology;  Laterality: Left;  RESTOR SHUGARCAINE  . COLONOSCOPY WITH ESOPHAGOGASTRODUODENOSCOPY (EGD)    . CORONARY ANGIOPLASTY WITH STENT PLACEMENT  2010   "1 stent"  . CORONARY ANGIOPLASTY WITH STENT PLACEMENT  01/19/2017  .  CORONARY ATHERECTOMY N/A 01/19/2017   Procedure: CORONARY ATHERECTOMY - CSI;  Surgeon: [name], Zaylah Blecha A, Vincent Kennedy;  Location: [facility];  Service: Cardiovascular;  Laterality: N/A;  unable to cross lesion  . CORONARY STENT INTERVENTION N/A 01/19/2017   Procedure: CORONARY STENT INTERVENTION;  Surgeon: [name], Zebediah Beezley A, Vincent Kennedy;  Location: [facility];  Service: Cardiovascular;  Laterality: N/A;  .  INGUINAL HERNIA REPAIR Bilateral   . RIGHT/LEFT HEART CATH AND CORONARY ANGIOGRAPHY N/A 01/12/2017   Procedure: RIGHT/LEFT HEART CATH AND CORONARY ANGIOGRAPHY;  Surgeon: [name], Barth Trella A, Vincent Kennedy;  Location: [facility];  Service: Cardiovascular;  Laterality: N/A;     Current Outpatient Medications  Medication Sig Dispense Refill  . aspirin EC 81 MG tablet Take 1 tablet (81 mg total) by mouth daily. 30 tablet 6  . budesonide (PULMICORT) 0.5 MG/2ML nebulizer solution Take 2 mLs (0.5 mg total) by nebulization 2 (two) times daily. 260 mL 3  . chlorproMAZINE (THORAZINE) 25 MG tablet Take 1 tablet (25 mg total) by mouth 3 (three) times daily as needed for hiccoughs. 60 tablet 0  . clopidogrel (PLAVIX) 75 MG tablet Take 1 tablet (75 mg total) by mouth daily. 90 tablet 3  . finasteride (PROSCAR) 5 MG tablet Take 1 tablet (5 mg total) by mouth daily. 90 tablet 3  . metoprolol tartrate (LOPRESSOR) 25 MG tablet Take 1 tablet (25 mg total) by mouth 2 (two) times daily. 180 tablet 3  . mometasone-formoterol (DULERA) 100-5 MCG/ACT AERO Inhale 2 puffs into the lungs 2 (two) times daily. 1 Inhaler 1  . nitroGLYCERIN (NITROSTAT) 0.4 MG SL tablet Place 1 tablet (0.4 mg total) under the tongue every 5 (five) minutes as needed for chest pain. 25 tablet 12  . pantoprazole (PROTONIX) 40 MG tablet Take 1 tablet (40 mg total) by mouth daily. 30 tablet 6  . Pirfenidone (ESBRIET) 267 MG CAPS Take 801 mg by mouth 3 (three) times daily.     . rivastigmine (EXELON) 9.5 mg/24hr Place 1 patch (9.5 mg total) onto the skin daily. (Patient taking differently: Place 9.5 mg onto the skin once a week. ) 30 patch 2  . rosuvastatin (CRESTOR) 40 MG tablet Take 40 mg by mouth every day 90 tablet 3  . zolpidem (AMBIEN) 10 MG tablet Take 1 tablet (10 mg total) by mouth at bedtime as needed for sleep. 90 tablet 1   No current facility-administered medications for this visit.     Allergies:   Patient has no known allergies.     Social History:  The patient  reports that he has never smoked. He has never used smokeless tobacco. He reports that he does not drink alcohol or use drugs.   Family History:  The patient's family history includes CAD in his brother and father.    ROS:  Please see the history of present illness.   Otherwise, review of systems are positive for none.   All other systems are reviewed and negative.    PHYSICAL EXAM: VS:  BP 128/70 (BP Location: Left Arm, Patient Position: Sitting, Cuff Size: Normal)   Pulse 64   Ht 5\' 7"  (1.702 m)   Wt 146 lb (66.2 kg)   SpO2 96%   BMI 22.87 kg/m  , BMI Body mass index is 22.87 kg/m. GEN: Well nourished, well developed, in no acute distress  HEENT: normal  Neck: no JVD, carotid bruits, or masses Cardiac: RRR; no murmurs, rubs, or gallops,no edema  Respiratory: Diminished breath sounds  with dry crackles. GI: soft, nontender, nondistended, + BS MS: no deformity or atrophy  Skin: warm and dry, no rash Neuro:  Strength and sensation are intact Psych: euthymic mood, full affect   EKG:  EKG is ordered today. The ekg ordered today demonstrates normal sinus rhythm with left atrial enlargement and no significant ST or T wave changes.   Recent Labs: 01/20/2017: BUN 10; Creatinine, Ser 0.76; Hemoglobin 11.6; Platelets 137; Potassium 4.2; Sodium 137 03/11/2017: ALT 11    Lipid Panel    Component Value Date/Time   CHOL 131 03/11/2017 1055   TRIG 99 03/11/2017 1055   HDL 38 (L) 03/11/2017 1055   CHOLHDL 3.4 03/11/2017 1055   CHOLHDL 5.5 (H) 12/24/2015 1105   VLDL 21 12/24/2015 1105   LDLCALC 73 03/11/2017 1055      Wt Readings from Last 3 Encounters:  09/02/17 146 lb (66.2 kg)  06/27/17 150 lb 12.8 oz (68.4 kg)  03/11/17 153 lb 12 oz (69.7 kg)     No flowsheet data found.    ASSESSMENT AND PLAN:  1.  Coronary artery disease involving native coronary arteries with other forms of angina: He is having recurrent exertional dyspnea with  minimal activities very similar to most recent PCI and drug-eluting stent placement.  Stress testing 6 months ago was unremarkable but his symptoms have persisted and thus I recommend proceeding with left heart catheterization and possible PCI to check the status of his LAD stents and diagonal stent.  He is aware of risks and benefits and will plan on doing it via the right radial artery.  2. Hyperlipidemia: Currently on rosuvastatin 40 mg once daily.   Most recent LDL was 73 which is close to target.  3. Pulmonary fibrosis: Managed by [facility] and [name].   4. Essential hypertension: Blood pressure is well controlled on metoprolol and amlodipine.   Disposition:   FU with me in 2 months  Signed,  Vincent Bears, Vincent Kennedy  09/02/2017 9:39 AM    [facility]

## 2017-09-02 NOTE — Progress Notes (Signed)
Cardiology Office Note   Date:  09/02/2017   ID:  Vincent Kennedy, DOB Oct 05, 1932, MRN [id number]  PCP:  [facility]  Cardiologist:   [name], MD   Chief Complaint  Patient presents with  . other    2 week follow up. Meds reviewed by the pt. verbally. "doing well."       History of Present Illness: Vincent Kennedy is a 84 y.o. male who is here today for follow-up visit regarding coronary artery disease. He is a retired Development worker, international [employer].  He had previous first diagonal ostial drug-eluting stent placement in 2008. He was diagnosed with pulmonary fibrosis in 2016. He is on continuous oxygen therapy.  He had worsening exertional dyspnea and underwent a right and left cardiac catheterization in October of 2018 which showed severe one-vessel coronary artery disease with patent diagonal stent, 95% heavily calcified stenosis in the mid LAD at the origin of the second diagonal and moderate proximal LAD stenosis.  Ejection fraction was normal.  Right heart catheterization showed normal filling pressures, mild pulmonary hypertension and normal cardiac output.  Saturation on showed evidence of a step up at the atrial level suggestive of a possible ASD.  However, IVC sat was not obtained which could have affected the results. I performed successful angioplasty and drug-eluting stent placement to the proximal and mid LAD.  He has done well initially but then started having recurrent shortness of breath similar to before PCI.  He underwent a nuclear stress test in January of this year which showed no evidence of ischemia with normal ejection fraction.  He feels similar to how he felt before most recent PCI.  no chest pain.  Exertional palpitations improved after switching carvedilol to metoprolol.   Past Medical History:  Diagnosis Date  . Abnormal nuclear cardiac imaging test    Dr. [name]  . BPH (benign prostatic hyperplasia)   . Coronary artery disease    Cardiac  cath September 2008: 20% left main stenosis, 40% mid LAD stenosis, 99% ostial first diagonal stenosis in a large branch and 90% ostial disease in second diagonal but the vessel was very small, 20% mid RCA stenosis with normal ejection fraction. Successful angioplasty and drug-eluting stent placement to the ostial first diagonal with a 2.5 x 15 mm Xience stent  . GERD (gastroesophageal reflux disease)   . Hyperlipidemia   . Hypertension    BP has never been high(per pt).  Metoprolol is to keep BP low because of stent (afterload)  . On home oxygen therapy    "2-3L; 24/7" (01/19/2017)  . OSA on CPAP   . Oxygen deficiency   . Pneumonia 07/2016  . Pulmonary fibrosis (HCC) 08/26/2015  . Shortness of breath dyspnea    with exertion  . Wears hearing aid    (sometimes)    Past Surgical History:  Procedure Laterality Date  . CATARACT EXTRACTION W/PHACO Right 01/29/2015   Procedure: CATARACT EXTRACTION PHACO AND INTRAOCULAR LENS PLACEMENT (IOC);  Surgeon: Lockie Mola, MD;  Location: [facility];  Service: Ophthalmology;  Laterality: Right;  RESTOR LENS CPAP  . CATARACT EXTRACTION W/PHACO Left 03/12/2015   Procedure: CATARACT EXTRACTION PHACO AND INTRAOCULAR LENS PLACEMENT (IOC);  Surgeon: Lockie Mola, MD;  Location: [facility];  Service: Ophthalmology;  Laterality: Left;  RESTOR SHUGARCAINE  . COLONOSCOPY WITH ESOPHAGOGASTRODUODENOSCOPY (EGD)    . CORONARY ANGIOPLASTY WITH STENT PLACEMENT  2010   "1 stent"  . CORONARY ANGIOPLASTY WITH STENT PLACEMENT  01/19/2017  .  CORONARY ATHERECTOMY N/A 01/19/2017   Procedure: CORONARY ATHERECTOMY - CSI;  Surgeon: [name], Muhammad A, MD;  [facility];  Service: Cardiovascular;  Laterality: N/A;  unable to cross lesion  . CORONARY STENT INTERVENTION N/A 01/19/2017   Procedure: CORONARY STENT INTERVENTION;  Surgeon: [name], Muhammad A, MD;  [facility]: [facility];  Service: Cardiovascular;  Laterality: N/A;  .  INGUINAL HERNIA REPAIR Bilateral   . RIGHT/LEFT HEART CATH AND CORONARY ANGIOGRAPHY N/A 01/12/2017   Procedure: RIGHT/LEFT HEART CATH AND CORONARY ANGIOGRAPHY;  Surgeon: [name], Muhammad A, MD;  [facility]: [facility];  Service: Cardiovascular;  Laterality: N/A;     Current Outpatient Medications  Medication Sig Dispense Refill  . aspirin EC 81 MG tablet Take 1 tablet (81 mg total) by mouth daily. 30 tablet 6  . budesonide (PULMICORT) 0.5 MG/2ML nebulizer solution Take 2 mLs (0.5 mg total) by nebulization 2 (two) times daily. 260 mL 3  . chlorproMAZINE (THORAZINE) 25 MG tablet Take 1 tablet (25 mg total) by mouth 3 (three) times daily as needed for hiccoughs. 60 tablet 0  . clopidogrel (PLAVIX) 75 MG tablet Take 1 tablet (75 mg total) by mouth daily. 90 tablet 3  . finasteride (PROSCAR) 5 MG tablet Take 1 tablet (5 mg total) by mouth daily. 90 tablet 3  . metoprolol tartrate (LOPRESSOR) 25 MG tablet Take 1 tablet (25 mg total) by mouth 2 (two) times daily. 180 tablet 3  . mometasone-formoterol (DULERA) 100-5 MCG/ACT AERO Inhale 2 puffs into the lungs 2 (two) times daily. 1 Inhaler 1  . nitroGLYCERIN (NITROSTAT) 0.4 MG SL tablet Place 1 tablet (0.4 mg total) under the tongue every 5 (five) minutes as needed for chest pain. 25 tablet 12  . pantoprazole (PROTONIX) 40 MG tablet Take 1 tablet (40 mg total) by mouth daily. 30 tablet 6  . Pirfenidone (ESBRIET) 267 MG CAPS Take 801 mg by mouth 3 (three) times daily.     . rivastigmine (EXELON) 9.5 mg/24hr Place 1 patch (9.5 mg total) onto the skin daily. (Patient taking differently: Place 9.5 mg onto the skin once a week. ) 30 patch 2  . rosuvastatin (CRESTOR) 40 MG tablet Take 40 mg by mouth every day 90 tablet 3  . zolpidem (AMBIEN) 10 MG tablet Take 1 tablet (10 mg total) by mouth at bedtime as needed for sleep. 90 tablet 1   No current facility-administered medications for this visit.     Allergies:   Patient has no known allergies.     Social History:  The patient  reports that he has never smoked. He has never used smokeless tobacco. He reports that he does not drink alcohol or use drugs.   Family History:  The patient's family history includes CAD in his brother and father.    ROS:  Please see the history of present illness.   Otherwise, review of systems are positive for none.   All other systems are reviewed and negative.    PHYSICAL EXAM: VS:  BP 128/70 (BP Location: Left Arm, Patient Position: Sitting, Cuff Size: Normal)   Pulse 64   Ht 5\' 7"  (1.702 m)   Wt 146 lb (66.2 kg)   SpO2 96%   BMI 22.87 kg/m  , BMI Body mass index is 22.87 kg/m. GEN: Well nourished, well developed, in no acute distress  HEENT: normal  Neck: no JVD, carotid bruits, or masses Cardiac: RRR; no murmurs, rubs, or gallops,no edema  Respiratory: Diminished breath sounds  with dry crackles. GI: soft, nontender, nondistended, + BS MS: no deformity or atrophy  Skin: warm and dry, no rash Neuro:  Strength and sensation are intact Psych: euthymic mood, full affect   EKG:  EKG is ordered today. The ekg ordered today demonstrates normal sinus rhythm with left atrial enlargement and no significant ST or T wave changes.   Recent Labs: 01/20/2017: BUN 10; Creatinine, Ser 0.76; Hemoglobin 11.6; Platelets 137; Potassium 4.2; Sodium 137 03/11/2017: ALT 11    Lipid Panel    Component Value Date/Time   CHOL 131 03/11/2017 1055   TRIG 99 03/11/2017 1055   HDL 38 (L) 03/11/2017 1055   CHOLHDL 3.4 03/11/2017 1055   CHOLHDL 5.5 (H) 12/24/2015 1105   VLDL 21 12/24/2015 1105   LDLCALC 73 03/11/2017 1055      Wt Readings from Last 3 Encounters:  09/02/17 146 lb (66.2 kg)  06/27/17 150 lb 12.8 oz (68.4 kg)  03/11/17 153 lb 12 oz (69.7 kg)     No flowsheet data found.    ASSESSMENT AND PLAN:  1.  Coronary artery disease involving native coronary arteries with other forms of angina: He is having recurrent exertional dyspnea with  minimal activities very similar to most recent PCI and drug-eluting stent placement.  Stress testing 6 months ago was unremarkable but his symptoms have persisted and thus I recommend proceeding with left heart catheterization and possible PCI to check the status of his LAD stents and diagonal stent.  He is aware of risks and benefits and will plan on doing it via the right radial artery.  2. Hyperlipidemia: Currently on rosuvastatin 40 mg once daily.   Most recent LDL was 73 which is close to target.  3. Pulmonary fibrosis: Managed by [facility] and [name].   4. Essential hypertension: Blood pressure is well controlled on metoprolol and amlodipine.   Disposition:   FU with me in 2 months  Signed,  [name], MD  09/02/2017 9:39 AM    [facility]

## 2017-09-03 LAB — BASIC METABOLIC PANEL
BUN/Creatinine Ratio: 15 (ref 10–24)
BUN: 12 mg/dL (ref 8–27)
CO2: 20 mmol/L (ref 20–29)
Calcium: 9.1 mg/dL (ref 8.6–10.2)
Chloride: 104 mmol/L (ref 96–106)
Creatinine, Ser: 0.81 mg/dL (ref 0.76–1.27)
GFR, EST AFRICAN AMERICAN: 95 mL/min/{1.73_m2} (ref >59)
GFR, EST NON AFRICAN AMERICAN: 82 mL/min/{1.73_m2} (ref >59)
Glucose: 92 mg/dL (ref 65–99)
Potassium: 4.3 mmol/L (ref 3.5–5.2)
SODIUM: 140 mmol/L (ref 134–144)

## 2017-09-03 LAB — CBC WITH DIFFERENTIAL/PLATELET
BASOS: 0 %
Basophils Absolute: 0 10*3/uL (ref 0.0–0.2)
EOS (ABSOLUTE): 0.3 10*3/uL (ref 0.0–0.4)
EOS: 4 %
HEMATOCRIT: 42.5 % (ref 37.5–51.0)
Hemoglobin: 14 g/dL (ref 13.0–17.7)
IMMATURE GRANS (ABS): 0 10*3/uL (ref 0.0–0.1)
Immature Granulocytes: 0 %
LYMPHS: 20 %
Lymphocytes Absolute: 1.4 10*3/uL (ref 0.7–3.1)
MCH: 28.6 pg (ref 26.6–33.0)
MCHC: 32.9 g/dL (ref 31.5–35.7)
MCV: 87 fL (ref 79–97)
MONOS ABS: 0.4 10*3/uL (ref 0.1–0.9)
Monocytes: 6 %
NEUTROS ABS: 5.2 10*3/uL (ref 1.4–7.0)
Neutrophils: 70 %
PLATELETS: 213 10*3/uL (ref 150–450)
RBC: 4.89 x10E6/uL (ref 4.14–5.80)
RDW: 14.9 % (ref 12.3–15.4)
WBC: 7.4 10*3/uL (ref 3.4–10.8)

## 2017-09-07 NOTE — Progress Notes (Signed)
Scanned in overnight pulse oximetry started on 07/18/17 at 11:35 pm.

## 2017-09-12 ENCOUNTER — Ambulatory Visit
Admission: RE | Admit: 2017-09-12 | Discharge: 2017-09-12 | Disposition: A | Discharge status: Home / Self Care | Payer: Medicare Other | Source: Ambulatory Visit | Attending: Cardiovascular Disease | Admitting: Cardiovascular Disease

## 2017-09-12 ENCOUNTER — Encounter
Admission: RE | Disposition: A | Discharge status: Home / Self Care | Payer: Self-pay | Source: Ambulatory Visit | Attending: Cardiovascular Disease

## 2017-09-12 DIAGNOSIS — Z9842 Cataract extraction status, left eye: Secondary | ICD-10-CM | POA: Insufficient documentation

## 2017-09-12 DIAGNOSIS — Z9981 Dependence on supplemental oxygen: Secondary | ICD-10-CM | POA: Insufficient documentation

## 2017-09-12 DIAGNOSIS — I2510 Atherosclerotic heart disease of native coronary artery without angina pectoris: Secondary | ICD-10-CM | POA: Insufficient documentation

## 2017-09-12 DIAGNOSIS — Z8249 Family history of ischemic heart disease and other diseases of the circulatory system: Secondary | ICD-10-CM | POA: Insufficient documentation

## 2017-09-12 DIAGNOSIS — Z955 Presence of coronary angioplasty implant and graft: Secondary | ICD-10-CM | POA: Diagnosis not present

## 2017-09-12 DIAGNOSIS — Z7901 Long term (current) use of anticoagulants: Secondary | ICD-10-CM | POA: Insufficient documentation

## 2017-09-12 DIAGNOSIS — G4733 Obstructive sleep apnea (adult) (pediatric): Secondary | ICD-10-CM | POA: Insufficient documentation

## 2017-09-12 DIAGNOSIS — Z79899 Other long term (current) drug therapy: Secondary | ICD-10-CM | POA: Diagnosis not present

## 2017-09-12 DIAGNOSIS — I10 Essential (primary) hypertension: Secondary | ICD-10-CM | POA: Insufficient documentation

## 2017-09-12 DIAGNOSIS — Z7951 Long term (current) use of inhaled steroids: Secondary | ICD-10-CM | POA: Insufficient documentation

## 2017-09-12 DIAGNOSIS — R0609 Other forms of dyspnea: Secondary | ICD-10-CM | POA: Diagnosis not present

## 2017-09-12 DIAGNOSIS — N400 Enlarged prostate without lower urinary tract symptoms: Secondary | ICD-10-CM | POA: Diagnosis not present

## 2017-09-12 DIAGNOSIS — Z9889 Other specified postprocedural states: Secondary | ICD-10-CM | POA: Insufficient documentation

## 2017-09-12 DIAGNOSIS — Z9841 Cataract extraction status, right eye: Secondary | ICD-10-CM | POA: Insufficient documentation

## 2017-09-12 DIAGNOSIS — K219 Gastro-esophageal reflux disease without esophagitis: Secondary | ICD-10-CM | POA: Insufficient documentation

## 2017-09-12 DIAGNOSIS — I272 Pulmonary hypertension, unspecified: Secondary | ICD-10-CM | POA: Diagnosis not present

## 2017-09-12 DIAGNOSIS — J8410 Pulmonary fibrosis, unspecified: Secondary | ICD-10-CM | POA: Diagnosis not present

## 2017-09-12 DIAGNOSIS — I25118 Atherosclerotic heart disease of native coronary artery with other forms of angina pectoris: Secondary | ICD-10-CM | POA: Diagnosis not present

## 2017-09-12 DIAGNOSIS — E785 Hyperlipidemia, unspecified: Secondary | ICD-10-CM | POA: Insufficient documentation

## 2017-09-12 DIAGNOSIS — Z7982 Long term (current) use of aspirin: Secondary | ICD-10-CM | POA: Insufficient documentation

## 2017-09-12 HISTORY — PX: LEFT HEART CATH AND CORONARY ANGIOGRAPHY: CATH118249

## 2017-09-12 SURGERY — LEFT HEART CATH AND CORONARY ANGIOGRAPHY
Anesthesia: Moderate Sedation | Laterality: Left

## 2017-09-12 MED ORDER — IOPAMIDOL (ISOVUE-300) INJECTION 61%
INTRAVENOUS | Status: DC | PRN
  Administered 2017-09-12: 80 mL via INTRA_ARTERIAL

## 2017-09-12 MED ORDER — LIDOCAINE HCL (PF) 1 % IJ SOLN
INTRAMUSCULAR | Status: DC | PRN
  Administered 2017-09-12: 1 mL via INTRADERMAL

## 2017-09-12 MED ORDER — FENTANYL CITRATE (PF) 100 MCG/2ML IJ SOLN
INTRAMUSCULAR | Status: DC | PRN
  Administered 2017-09-12: 25 ug via INTRAVENOUS

## 2017-09-12 MED ORDER — HEPARIN SODIUM (PORCINE) 1000 UNIT/ML IJ SOLN
INTRAMUSCULAR | Status: DC | PRN
  Administered 2017-09-12: 3500 [IU] via INTRAVENOUS

## 2017-09-12 MED ORDER — MIDAZOLAM HCL 2 MG/2ML IJ SOLN
INTRAMUSCULAR | Status: DC | PRN
  Administered 2017-09-12: 1 mg via INTRAVENOUS

## 2017-09-12 MED ORDER — VERAPAMIL HCL 2.5 MG/ML IV SOLN
INTRAVENOUS | Status: DC | PRN
  Administered 2017-09-12: 2.5 mg via INTRA_ARTERIAL

## 2017-09-12 MED ORDER — SODIUM CHLORIDE 0.9 % IV SOLN: 250.0000 mL | INTRAVENOUS | Status: DC | PRN

## 2017-09-12 MED ORDER — SODIUM CHLORIDE 0.9% FLUSH: 3.0000 mL | INTRAVENOUS | Status: DC | PRN

## 2017-09-12 MED ORDER — SODIUM CHLORIDE 0.9% FLUSH: 3.0000 mL | Freq: Two times a day (BID) | INTRAVENOUS | Status: DC

## 2017-09-12 MED ORDER — MIDAZOLAM HCL 2 MG/2ML IJ SOLN
INTRAMUSCULAR | Status: AC
  Filled 2017-09-12: qty 2

## 2017-09-12 MED ORDER — VERAPAMIL HCL 2.5 MG/ML IV SOLN
INTRAVENOUS | Status: AC
  Filled 2017-09-12: qty 2

## 2017-09-12 MED ORDER — HEPARIN SODIUM (PORCINE) 1000 UNIT/ML IJ SOLN
INTRAMUSCULAR | Status: AC
  Filled 2017-09-12: qty 1

## 2017-09-12 MED ORDER — SODIUM CHLORIDE 0.9 % IV SOLN
250.0000 mL | INTRAVENOUS | Status: DC | PRN
Start: 2017-09-12 — End: 2017-09-12

## 2017-09-12 MED ORDER — SODIUM CHLORIDE 0.9 % IV SOLN: INTRAVENOUS | Status: DC

## 2017-09-12 MED ORDER — SODIUM CHLORIDE 0.9 % WEIGHT BASED INFUSION
3.0000 mL/kg/h | INTRAVENOUS | Status: AC
  Administered 2017-09-12: 3 mL/kg/h via INTRAVENOUS

## 2017-09-12 MED ORDER — ACETAMINOPHEN 325 MG PO TABS: 650.0000 mg | ORAL_TABLET | ORAL | Status: DC | PRN

## 2017-09-12 MED ORDER — FENTANYL CITRATE (PF) 100 MCG/2ML IJ SOLN
INTRAMUSCULAR | Status: AC
  Filled 2017-09-12: qty 2

## 2017-09-12 MED ORDER — HEPARIN (PORCINE) IN NACL 1000-0.9 UT/500ML-% IV SOLN
INTRAVENOUS | Status: AC
  Filled 2017-09-12: qty 1000

## 2017-09-12 MED ORDER — SODIUM CHLORIDE 0.9 % WEIGHT BASED INFUSION: 1.0000 mL/kg/h | INTRAVENOUS | Status: DC

## 2017-09-12 MED ORDER — ASPIRIN 81 MG PO CHEW: 81.0000 mg | CHEWABLE_TABLET | ORAL | Status: DC

## 2017-09-12 MED ORDER — LIDOCAINE HCL (PF) 1 % IJ SOLN
INTRAMUSCULAR | Status: AC
  Filled 2017-09-12: qty 30

## 2017-09-12 MED ORDER — ONDANSETRON HCL 4 MG/2ML IJ SOLN
4.0000 mg | Freq: Four times a day (QID) | INTRAMUSCULAR | Status: DC | PRN
Start: 2017-09-12 — End: 2017-09-12

## 2017-09-12 SURGICAL SUPPLY — 10 items
CATH INFINITI 5 FR JL3.5 (CATHETERS) ×2 IMPLANT
CATH INFINITI JR4 5F (CATHETERS) ×2 IMPLANT
DEVICE RAD TR BAND REGULAR (VASCULAR PRODUCTS) ×2 IMPLANT
KIT MANI 3VAL PERCEP (MISCELLANEOUS) ×2 IMPLANT
NEEDLE PERC 21GX4CM (NEEDLE) ×2 IMPLANT
PACK CARDIAC CATH (CUSTOM PROCEDURE TRAY) ×2 IMPLANT
SHEATH RAIN 4/5FR (SHEATH) IMPLANT
SHEATH RAIN RADIAL 21G 6FR (SHEATH) ×2 IMPLANT
WIRE HITORQ VERSACORE ST 145CM (WIRE) ×2 IMPLANT
WIRE ROSEN-J .035X260CM (WIRE) ×2 IMPLANT

## 2017-09-12 NOTE — Interval H&P Note (Signed)
History and Physical Interval Note:  09/12/2017 12:15 PM  Vincent Kennedy  has presented today for surgery, with the diagnosis of LT Heart Cath poss PCI   CAD  The various methods of treatment have been discussed with the patient and family. After consideration of risks, benefits and other options for treatment, the patient has consented to  Procedure(s): LEFT HEART CATH AND CORONARY ANGIOGRAPHY (Left) as a surgical intervention .  The patient's history has been reviewed, patient examined, no change in status, stable for surgery.  I have reviewed the patient's chart and labs.  Questions were answered to the patient's satisfaction.     Lorine BearsMuhammad Jadore Mcguffin

## 2017-09-12 NOTE — Progress Notes (Signed)
Patient resting at this time. Slowly releasing air secondarily to slight ooze earlier at radial site. Denies complaints. Wife at bedside.

## 2017-09-12 NOTE — Progress Notes (Signed)
Patient remains clinically stable post heart  Cath, TR band off at this time. No bleeding nor hematoma visible. Discharge instructions given with questions answered. Denies complaints.

## 2017-09-12 NOTE — Discharge Instructions (Signed)
Moderate Conscious Sedation, Adult, Care After These instructions provide you with information about caring for yourself after your procedure. Your health care provider may also give you more specific instructions. Your treatment has been planned according to current medical practices, but problems sometimes occur. Call your health care provider if you have any problems or questions after your procedure. What can I expect after the procedure? After your procedure, it is common:  To feel sleepy for several hours.  To feel clumsy and have poor balance for several hours.  To have poor judgment for several hours.  To vomit if you eat too soon.  Follow these instructions at home: For at least 24 hours after the procedure:   Do not: ? Participate in activities where you could fall or become injured. ? Drive. ? Use heavy machinery. ? Drink alcohol. ? Take sleeping pills or medicines that cause drowsiness. ? Make important decisions or sign legal documents. ? Take care of children on your own.  Rest. Eating and drinking  Follow the diet recommended by your health care provider.  If you vomit: ? Drink water, juice, or soup when you can drink without vomiting. ? Make sure you have little or no nausea before eating solid foods. General instructions  Have a responsible adult stay with you until you are awake and alert.  Take over-the-counter and prescription medicines only as told by your health care provider.  If you smoke, do not smoke without supervision.  Keep all follow-up visits as told by your health care provider. This is important. Contact a health care provider if:  You keep feeling nauseous or you keep vomiting.  You feel light-headed.  You develop a rash.  You have a fever. Get help right away if:  You have trouble breathing. This information is not intended to replace advice given to you by your health care provider. Make sure you discuss any questions you have  with your health care provider. Document Released: 12/27/2012 Document Revised: 08/11/2015 Document Reviewed: 06/28/2015 Elsevier Interactive Patient Education  2018 Elsevier Inc[employer] Radial Site Care Refer to this sheet in the next few weeks. These instructions provide you with information about caring for yourself after your procedure. Your health care provider may also give you more specific instructions. Your treatment has been planned according to current medical practices, but problems sometimes occur. Call your health care provider if you have any problems or questions after your procedure. What can I expect after the procedure? After your procedure, it is typical to have the following:  Bruising at the radial site that usually fades within 1-2 weeks.  Blood collecting in the tissue (hematoma) that may be painful to the touch. It should usually decrease in size and tenderness within 1-2 weeks.  Follow these instructions at home:  Take medicines only as directed by your health care provider.  You may shower 24-48 hours after the procedure or as directed by your health care provider. Remove the bandage (dressing) and gently wash the site with plain soap and water. Pat the area dry with a clean towel. Do not rub the site, because this may cause bleeding.  Do not take baths, swim, or use a hot tub until your health care provider approves.  Check your insertion site every day for redness, swelling, or drainage.  Do not apply powder or lotion to the site.  Do not flex or bend the affected arm for 24 hours or as directed by your health care provider.  Do not   push or pull heavy objects with the affected arm for 24 hours or as directed by your health care provider.  Do not lift over 10 lb (4.5 kg) for 5 days after your procedure or as directed by your health care provider.  Ask your health care provider when it is okay to: ? Return to work or school. ? Resume usual physical activities or  sports. ? Resume sexual activity.  Do not drive home if you are discharged the same day as the procedure. Have someone else drive you.  You may drive 24 hours after the procedure unless otherwise instructed by your health care provider.  Do not operate machinery or power tools for 24 hours after the procedure.  If your procedure was done as an outpatient procedure, which means that you went home the same day as your procedure, a responsible adult should be with you for the first 24 hours after you arrive home.  Keep all follow-up visits as directed by your health care provider. This is important. Contact a health care provider if:  You have a fever.  You have chills.  You have increased bleeding from the radial site. Hold pressure on the site. Get help right away if:  You have unusual pain at the radial site.  You have redness, warmth, or swelling at the radial site.  You have drainage (other than a small amount of blood on the dressing) from the radial site.  The radial site is bleeding, and the bleeding does not stop after 30 minutes of holding steady pressure on the site.  Your arm or hand becomes pale, cool, tingly, or numb. This information is not intended to replace advice given to you by your health care provider. Make sure you discuss any questions you have with your health care provider. Document Released: 04/10/2010 Document Revised: 08/14/2015 Document Reviewed: 09/24/2013 Elsevier Interactive Patient Education  2018 Elsevier Inc.  

## 2017-09-12 NOTE — Progress Notes (Signed)
Patient clinically stable post heart cath with Dr Kirke CorinArida. Denies complaints. Vitals stable. Wife at bedside. Right radial TR band on with 15ml air,patient had been moving wrist about noting slight oozing, which has stopped after 15ml air instilled. Dr Kirke CorinArida out to speak with patient and wife with questions answered.

## 2017-09-13 ENCOUNTER — Encounter: Payer: Self-pay | Admitting: Cardiovascular Disease

## 2017-09-15 DIAGNOSIS — J9601 Acute respiratory failure with hypoxia: Secondary | ICD-10-CM | POA: Diagnosis not present

## 2017-09-16 NOTE — Progress Notes (Deleted)
Cardiology Office Note Date:  09/16/2017  Patient ID:  Vincent, Kennedy December 27, 1932, MRN [id number] PCP:  [facility]  Cardiologist:  Dr. [name], MD  ***refresh   Chief Complaint: Follow up  History of Present Illness: Vincent Kennedy is a 84 y.o. male with history of CAD s/p PCI as detailed below, chronic respiratory failure with hypoxia on supplemental oxygen at 2-3 L via nasal cannula secondary to pulmonary fibrosis diagnosed in 2016, HTN, HLD, oSA on CPAP, and GERD who presents for follow up.   Patient is a retired Development worker, international [employer]. Patient previously underwent PCI/DES to the ostial D1 in 2008. In 12/2016 he was noted to have worsening SOB and underwent a R/LHC that showed severe 1-vessel CAD with a patent diagonal stent. There was 95% heavily calcified stenosis in the mid LAD at the origin of the D2 and moderate proximal LAD stenosis. EF was normal. RHC showed normal filling pressures, mild pulmonary hypertension and normal cardiac output. Saturation on showed evidence of a step up at the atrial level suggestive of an ASD. However, IVC saturation was not obtained (felt to possibly have affected these results). He underwent successful PCI/DES to the proximal and mid LAD. Follow up TTE in 01/2017 showed an EF of 65-70%, no RWMA, calcified mitral annulus, PASP 423 mmHg. He was seen in the office on 09/02/2017 and was noted to have done well initially, though began to notice recurrent SOB similar to how he felt prior to his PCI in 12/2016. He underwent nuclear stress testing in 03/2017 that showed no evidence of ischemia with a normal EF. He underwent LHC on 09/12/2017 that showed widely patent LAD/diagongal stents with no significant restenosis, moderate 40% stenosis of the ostial LAD, and 30% ostial RCA stenosis. Mildly elevated LVEDP. LV gram was not performed given EF was known to be normal by recent Myoview. Continued medical therapy and treatment for underlying pulmonary  disease was advised.   ***  Past Medical History:  Diagnosis Date  . Abnormal nuclear cardiac imaging test    Dr. [name]  . BPH (benign prostatic hyperplasia)   . Coronary artery disease    Cardiac cath September 2008: 20% left main stenosis, 40% mid LAD stenosis, 99% ostial first diagonal stenosis in a large branch and 90% ostial disease in second diagonal but the vessel was very small, 20% mid RCA stenosis with normal ejection fraction. Successful angioplasty and drug-eluting stent placement to the ostial first diagonal with a 2.5 x 15 mm Xience stent  . GERD (gastroesophageal reflux disease)   . Hyperlipidemia   . Hypertension    BP has never been high(per pt).  Metoprolol is to keep BP low because of stent (afterload)  . On home oxygen therapy    "2-3L; 24/7" (01/19/2017)  . OSA on CPAP   . Oxygen deficiency   . Pneumonia 07/2016  . Pulmonary fibrosis (HCC) 08/26/2015  . Shortness of breath dyspnea    with exertion  . Wears hearing aid    (sometimes)    Past Surgical History:  Procedure Laterality Date  . CATARACT EXTRACTION W/PHACO Right 01/29/2015   Procedure: CATARACT EXTRACTION PHACO AND INTRAOCULAR LENS PLACEMENT (IOC);  Surgeon: Lockie Mola, MD;  Location: [facility];  Service: Ophthalmology;  Laterality: Right;  RESTOR LENS CPAP  . CATARACT EXTRACTION W/PHACO Left 03/12/2015   Procedure: CATARACT EXTRACTION PHACO AND INTRAOCULAR LENS PLACEMENT (IOC);  Surgeon: Lockie Mola, MD;  Location: [facility];  Service: Ophthalmology;  Laterality:  Left;  RESTOR SHUGARCAINE  . COLONOSCOPY WITH ESOPHAGOGASTRODUODENOSCOPY (EGD)    . CORONARY ANGIOPLASTY WITH STENT PLACEMENT  2010   "1 stent"  . CORONARY ANGIOPLASTY WITH STENT PLACEMENT  01/19/2017  . CORONARY ATHERECTOMY N/A 01/19/2017   Procedure: CORONARY ATHERECTOMY - CSI;  Surgeon: [name], Muhammad A, MD;  Location: [facility];  Service: Cardiovascular;  Laterality: N/A;  unable to cross  lesion  . CORONARY STENT INTERVENTION N/A 01/19/2017   Procedure: CORONARY STENT INTERVENTION;  Surgeon: [name], Muhammad A, MD;  Location: [facility];  Service: Cardiovascular;  Laterality: N/A;  . INGUINAL HERNIA REPAIR Bilateral   . LEFT HEART CATH AND CORONARY ANGIOGRAPHY Left 09/12/2017   Procedure: LEFT HEART CATH AND CORONARY ANGIOGRAPHY;  Surgeon: [name], Muhammad A, MD;  Location: [facility];  Service: Cardiovascular;  Laterality: Left;  . RIGHT/LEFT HEART CATH AND CORONARY ANGIOGRAPHY N/A 01/12/2017   Procedure: RIGHT/LEFT HEART CATH AND CORONARY ANGIOGRAPHY;  Surgeon: [name], Muhammad A, MD;  Location: [facility];  Service: Cardiovascular;  Laterality: N/A;    No outpatient medications have been marked as taking for the 09/19/17 encounter (Appointment) with Sondra Bargesunn, Terrian Sentell M, PA-C.    Allergies:   Patient has no known allergies.   Social History:  The patient  reports that he has never smoked. He has never used smokeless tobacco. He reports that he does not drink alcohol or use drugs.   Family History:  The patient's family history includes CAD in his brother and father.  ROS:   ROS   PHYSICAL EXAM: *** VS:  There were no vitals taken for this visit. BMI: There is no height or weight on file to calculate BMI.  Physical Exam   EKG:  Was ordered and interpreted by me today. Shows ***  Recent Labs: 03/11/2017: ALT 11 09/02/2017: BUN 12; Creatinine, Ser 0.81; Hemoglobin 14.0; Platelets 213; Potassium 4.3; Sodium 140  03/11/2017: Chol/HDL Ratio 3.4; Cholesterol, Total 131; HDL 38; LDL Calculated 73; Triglycerides 99   Estimated Creatinine Clearance: 64.6 mL/min (by C-G formula based on SCr of 0.81 mg/dL).   Wt Readings from Last 3 Encounters:  09/12/17 146 lb (66.2 kg)  09/02/17 146 lb (66.2 kg)  06/27/17 150 lb 12.8 oz (68.4 kg)     Other studies reviewed: Additional studies/records reviewed today include: summarized above  ASSESSMENT AND  PLAN:  1. ***  Disposition: F/u with *** in   Current medicines are reviewed at length with the patient today.  The patient did not have any concerns regarding medicines.  Signed, Eula Listenyan Azaria Bartell, PA-C 09/16/2017 3:18 PM     [facility] [place] [address] [place], [place] [address] [phone]

## 2017-09-19 ENCOUNTER — Ambulatory Visit: Payer: Medicare Other | Admitting: Physician Assistant

## 2017-09-20 DIAGNOSIS — I25118 Atherosclerotic heart disease of native coronary artery with other forms of angina pectoris: Secondary | ICD-10-CM | POA: Diagnosis not present

## 2017-09-29 ENCOUNTER — Ambulatory Visit: Payer: Self-pay | Admitting: Internal Medicine

## 2017-10-04 ENCOUNTER — Other Ambulatory Visit: Payer: Self-pay | Admitting: Internal Medicine

## 2017-10-13 ENCOUNTER — Ambulatory Visit (INDEPENDENT_AMBULATORY_CARE_PROVIDER_SITE_OTHER): Payer: Medicare Other | Admitting: Internal Medicine

## 2017-10-13 VITALS — BP 148/70 | HR 70 | Resp 16 | Ht 68.0 in | Wt 153.0 lb

## 2017-10-13 DIAGNOSIS — I2510 Atherosclerotic heart disease of native coronary artery without angina pectoris: Secondary | ICD-10-CM | POA: Diagnosis not present

## 2017-10-13 DIAGNOSIS — J9611 Chronic respiratory failure with hypoxia: Secondary | ICD-10-CM | POA: Diagnosis not present

## 2017-10-13 DIAGNOSIS — R0602 Shortness of breath: Secondary | ICD-10-CM | POA: Diagnosis not present

## 2017-10-13 DIAGNOSIS — I2721 Secondary pulmonary arterial hypertension: Secondary | ICD-10-CM

## 2017-10-13 DIAGNOSIS — J84112 Idiopathic pulmonary fibrosis: Secondary | ICD-10-CM | POA: Diagnosis not present

## 2017-10-13 DIAGNOSIS — I2584 Coronary atherosclerosis due to calcified coronary lesion: Secondary | ICD-10-CM

## 2017-10-13 NOTE — Progress Notes (Signed)
[facility] [address] [place], [place] [address]  Pulmonary Sleep Medicine   Office Visit Note  Patient Name: Vincent Kennedy DOB: 12/11/1932 MRN [id number]  Date of Service: 10/13/2017  Complaints/HPI: He states that he has been having a decline in her functional status.  Patient states he is having more shortness of breath when he exerts himself.  Continues to use the oxygen as prescribed however it is not gaining as much benefit as he used to.  Denies having any chest pain.  He was seen by cardiology and had a cardiac catheterization done and he is being medically managed managed at this time.  He did have previous echocardiogram done which had shown pulmonary hypertension I would like to reevaluate that to make certain that his pulmonary pressures have not worsened.  If this is the case then he may actually benefit from tracleer  ROS  General: (-) fever, (-) chills, (-) night sweats, (-) weakness Skin: (-) rashes, (-) itching,. Eyes: (-) visual changes, (-) redness, (-) itching. Nose and Sinuses: (-) nasal stuffiness or itchiness, (-) postnasal drip, (-) nosebleeds, (-) sinus trouble. Mouth and Throat: (-) sore throat, (-) hoarseness. Neck: (-) swollen glands, (-) enlarged thyroid, (-) neck pain. Respiratory: + cough, (-) bloody sputum, + shortness of breath, - wheezing. Cardiovascular: - ankle swelling, (-) chest pain. Lymphatic: (-) lymph node enlargement. Neurologic: (-) numbness, (-) tingling. Psychiatric: (-) anxiety, (-) depression   Current Medication: Outpatient Encounter Medications as of 10/13/2017  Medication Sig  . aspirin EC 81 MG tablet Take 1 tablet (81 mg total) by mouth daily.  . budesonide (PULMICORT) 0.5 MG/2ML nebulizer solution Take 2 mLs (0.5 mg total) by nebulization 2 (two) times daily.  . clopidogrel (PLAVIX) 75 MG tablet Take 1 tablet (75 mg total) by mouth daily.  . finasteride (PROSCAR) 5 MG tablet Take 1 tablet (5 mg total) by  mouth daily.  . metoprolol succinate (TOPROL-XL) 25 MG 24 hr tablet Take 25 mg by mouth 3 (three) times daily.  . mometasone-formoterol (DULERA) 100-5 MCG/ACT AERO Inhale 2 puffs into the lungs 2 (two) times daily.  . rosuvastatin (CRESTOR) 40 MG tablet Take 40 mg by mouth every day  . zolpidem (AMBIEN) 10 MG tablet TAKE 1 TABLET BY MOUTH AT  BEDTIME AS NEEDED  . metoprolol tartrate (LOPRESSOR) 25 MG tablet Take 1 tablet (25 mg total) by mouth 2 (two) times daily.  . nitroGLYCERIN (NITROSTAT) 0.4 MG SL tablet Place 1 tablet (0.4 mg total) under the tongue every 5 (five) minutes as needed for chest pain. (Patient not taking: Reported on 09/12/2017)  . pantoprazole (PROTONIX) 40 MG tablet Take 1 tablet (40 mg total) by mouth daily. (Patient taking differently: Take 40 mg by mouth daily as needed (heartburn). )  . Pirfenidone (ESBRIET) 267 MG CAPS Take 801 mg by mouth 3 (three) times daily.   . rivastigmine (EXELON) 9.5 mg/24hr Place 1 patch (9.5 mg total) onto the skin daily.   No facility-administered encounter medications on file as of 10/13/2017.     Surgical History: Past Surgical History:  Procedure Laterality Date  . CATARACT EXTRACTION W/PHACO Right 01/29/2015   Procedure: CATARACT EXTRACTION PHACO AND INTRAOCULAR LENS PLACEMENT (IOC);  Surgeon: Lockie Mola, MD;  Location: [facility];  Service: Ophthalmology;  Laterality: Right;  RESTOR LENS CPAP  . CATARACT EXTRACTION W/PHACO Left 03/12/2015   Procedure: CATARACT EXTRACTION PHACO AND INTRAOCULAR LENS PLACEMENT (IOC);  Surgeon: Lockie Mola, MD;  Location: [facility];  Service: Ophthalmology;  Laterality: Left;  RESTOR SHUGARCAINE  . COLONOSCOPY WITH ESOPHAGOGASTRODUODENOSCOPY (EGD)    . CORONARY ANGIOPLASTY WITH STENT PLACEMENT  2010   "1 stent"  . CORONARY ANGIOPLASTY WITH STENT PLACEMENT  01/19/2017  . CORONARY ATHERECTOMY N/A 01/19/2017   Procedure: CORONARY ATHERECTOMY - CSI;  Surgeon: [name],  Muhammad A, MD;  Location: [facility];  Service: Cardiovascular;  Laterality: N/A;  unable to cross lesion  . CORONARY STENT INTERVENTION N/A 01/19/2017   Procedure: CORONARY STENT INTERVENTION;  Surgeon: [name], Muhammad A, MD;  Location: [facility];  Service: Cardiovascular;  Laterality: N/A;  . INGUINAL HERNIA REPAIR Bilateral   . LEFT HEART CATH AND CORONARY ANGIOGRAPHY Left 09/12/2017   Procedure: LEFT HEART CATH AND CORONARY ANGIOGRAPHY;  Surgeon: [name], Muhammad A, MD;  Location: [facility];  Service: Cardiovascular;  Laterality: Left;  . RIGHT/LEFT HEART CATH AND CORONARY ANGIOGRAPHY N/A 01/12/2017   Procedure: RIGHT/LEFT HEART CATH AND CORONARY ANGIOGRAPHY;  Surgeon: [name], Muhammad A, MD;  Location: [facility];  Service: Cardiovascular;  Laterality: N/A;    Medical History: Past Medical History:  Diagnosis Date  . Abnormal nuclear cardiac imaging test    Dr. Kirke CorinArida  . BPH (benign prostatic hyperplasia)   . Coronary artery disease    Cardiac cath September 2008: 20% left main stenosis, 40% mid LAD stenosis, 99% ostial first diagonal stenosis in a large branch and 90% ostial disease in second diagonal but the vessel was very small, 20% mid RCA stenosis with normal ejection fraction. Successful angioplasty and drug-eluting stent placement to the ostial first diagonal with a 2.5 x 15 mm Xience stent  . GERD (gastroesophageal reflux disease)   . Hyperlipidemia   . Hypertension    BP has never been high(per pt).  Metoprolol is to keep BP low because of stent (afterload)  . On home oxygen therapy    "2-3L; 24/7" (01/19/2017)  . OSA on CPAP   . Oxygen deficiency   . Pneumonia 07/2016  . Pulmonary fibrosis (HCC) 08/26/2015  . Shortness of breath dyspnea    with exertion  . Wears hearing aid    (sometimes)    Family History: Family History  Problem Relation Age of Onset  . CAD Father   . CAD Brother     Social History: Social History    Socioeconomic History  . Marital status: Married    Spouse name: Not on file  . Number of children: Not on file  . Years of education: Not on file  . Highest education level: Not on file  Occupational History  . Occupation: retired  Engineer, productionocial Needs  . Financial resource strain: Not on file  . Food insecurity:    Worry: Not on file    Inability: Not on file  . Transportation needs:    Medical: Not on file    Non-medical: Not on file  Tobacco Use  . Smoking status: Never Smoker  . Smokeless tobacco: Never Used  Substance and Sexual Activity  . Alcohol use: No    Alcohol/week: 0.0 oz  . Drug use: No  . Sexual activity: Yes    Comment: patient states minor  Lifestyle  . Physical activity:    Days per week: Not on file    Minutes per session: Not on file  . Stress: Not on file  Relationships  . Social connections:    Talks on phone: Not on file    Gets together: Not on file  Attends religious service: Not on file    Active member of club or organization: Not on file    Attends meetings of clubs or organizations: Not on file    Relationship status: Not on file  . Intimate partner violence:    Fear of current or ex partner: Not on file    Emotionally abused: Not on file    Physically abused: Not on file    Forced sexual activity: Not on file  Other Topics Concern  . Not on file  Social History Narrative  . Not on file    Vital Signs: Blood pressure (!) 148/70, pulse 70, resp. rate 16, height 5\' 8"  (1.727 m), weight 153 lb (69.4 kg), SpO2 94 %.  Examination: General Appearance: The patient is well-developed, well-nourished, and in no distress. Skin: Gross inspection of skin unremarkable. Head: normocephalic, no gross deformities. Eyes: no gross deformities noted. ENT: ears appear grossly normal no exudates. Neck: Supple. No thyromegaly. No LAD. Respiratory: Few crackles. Cardiovascular: Normal S1 and S2 without murmur or rub. +gallop Extremities: No cyanosis.  pulses are equal. Neurologic: Alert and oriented. No involuntary movements.  LABS: Recent Results (from the past 2160 hour(s))  Basic metabolic panel     Status: None   Collection Time: 09/02/17  9:58 AM  Result Value Ref Range   Glucose 92 65 - 99 mg/dL    Comment: Specimen received hemolyzed. Clinical correlation indicated.   BUN 12 8 - 27 mg/dL   Creatinine, Ser 1.61 0.76 - 1.27 mg/dL   GFR calc non Af Amer 82 >59 mL/min/1.73   GFR calc Af Amer 95 >59 mL/min/1.73   BUN/Creatinine Ratio 15 10 - 24   Sodium 140 134 - 144 mmol/L   Potassium 4.3 3.5 - 5.2 mmol/L    Comment: Specimen received hemolyzed. Clinical correlation indicated.   Chloride 104 96 - 106 mmol/L   CO2 20 20 - 29 mmol/L   Calcium 9.1 8.6 - 10.2 mg/dL  CBC with Differential/Platelet     Status: None   Collection Time: 09/02/17  9:58 AM  Result Value Ref Range   WBC 7.4 3.4 - 10.8 x10E3/uL   RBC 4.89 4.14 - 5.80 x10E6/uL   Hemoglobin 14.0 13.0 - 17.7 g/dL   Hematocrit 09.6 04.5 - 51.0 %   MCV 87 79 - 97 fL   MCH 28.6 26.6 - 33.0 pg   MCHC 32.9 31.5 - 35.7 g/dL   RDW 40.9 81.1 - 91.4 %   Platelets 213 150 - 450 x10E3/uL   Neutrophils 70 Not Estab. %   Lymphs 20 Not Estab. %   Monocytes 6 Not Estab. %   Eos 4 Not Estab. %   Basos 0 Not Estab. %   Neutrophils Absolute 5.2 1.4 - 7.0 x10E3/uL   Lymphocytes Absolute 1.4 0.7 - 3.1 x10E3/uL   Monocytes Absolute 0.4 0.1 - 0.9 x10E3/uL   EOS (ABSOLUTE) 0.3 0.0 - 0.4 x10E3/uL   Basophils Absolute 0.0 0.0 - 0.2 x10E3/uL   Immature Granulocytes 0 Not Estab. %   Immature Grans (Abs) 0.0 0.0 - 0.1 x10E3/uL    Radiology: No results found.  No results found.  No results found.    Assessment and Plan: Patient Active Problem List   Diagnosis Date Noted  . Effort angina (HCC) 01/19/2017  . CAD S/P percutaneous coronary angioplasty 11/25/2016  . Cataracts, bilateral 11/25/2016  . Chronic hypoxemic respiratory failure (HCC) 11/25/2016  . GERD (gastroesophageal  reflux disease) 11/25/2016  . Inguinal hernia  11/25/2016  . OSA on CPAP 11/25/2016  . Urinary retention   . Pneumonia 08/15/2016  . Need for vaccination for Strep pneumoniae 04/28/2016  . Incomplete emptying of bladder 09/10/2015  . Pulmonary fibrosis (HCC) 08/26/2015  . Medication monitoring encounter 08/26/2015  . Elevated antinuclear antibody (ANA) level 04/11/2015  . Dyspnea 03/18/2015  . Coronary artery disease involving native coronary artery of native heart without angina pectoris 10/16/2014  . Auditory disturbance 10/16/2014  . Hypertension goal BP (blood pressure) < 150/90 10/16/2014  . Alzheimer's dementia without behavioral disturbance 10/16/2014  . Status post insertion of drug eluting coronary artery stent 10/16/2014  . Medicare annual wellness visit, subsequent 10/16/2014  . Coronary artery disease   . Hyperlipidemia     1. IPF on esbriet has been on steroids now discontinued. Patient was not gaining any benefit from steroids 2. SOB progressive despite oxygen would be concerned about PAH. Had [facility] cath done last echo done had shown elevated PA pressures in 2018 which was 42. In 2016 an echo revealed PA pressure of 32 so there has been an increase. I am going to order a follow up echo 3. CAD stable medical management 4. Chronic respiratory failure with hypoxia on oxygen therapy 2.5lpm 5. PAH will get follow up echo to reassess PA pressure may benefit from bosenten  General Counseling: I have discussed the findings of the evaluation and examination with [facility].  I have also discussed any further diagnostic evaluation thatmay be needed or ordered today. Justo verbalizes understanding of the findings of todays visit. We also reviewed his medications today and discussed drug interactions and side effects including but not limited excessive drowsiness and altered mental states. We also discussed that there is always a risk not just to him but also people around him. he has been  encouraged to call the office with any questions or concerns that should arise related to todays visit.    Time spent:  I have personally obtained a history, examined the patient, evaluated laboratory and imaging results, formulated the assessment and plan and placed orders.    Yevonne Pax, MD [facility] Pulmonary and Critical Care Sleep medicine

## 2017-10-15 DIAGNOSIS — J9601 Acute respiratory failure with hypoxia: Secondary | ICD-10-CM | POA: Diagnosis not present

## 2017-10-18 ENCOUNTER — Ambulatory Visit: Payer: Medicare Other | Admitting: Physician Assistant

## 2017-10-21 ENCOUNTER — Encounter: Payer: Self-pay | Admitting: Internal Medicine

## 2017-10-21 NOTE — Patient Instructions (Signed)
Pulmonary Fibrosis °Pulmonary fibrosis is a type of lung disease that causes scarring. Over time, the scar tissue (fibrosis) builds up in the air sacs of your lungs. This makes it hard for you to breathe. Less oxygen can get into your blood. °Scarring from pulmonary fibrosis gets worse over time. This damage is permanent. Having damaged lungs may make it more likely that you will have heart problems as well. °What are the causes? °Usually, the cause of pulmonary fibrosis is not known (idiopathic pulmonary fibrosis). However, pulmonary fibrosis can be caused by: °· Exposure to occupational and environmental toxins. These include asbestos, silica, and metal dusts. °· Inhaling moldy hay. This can cause an allergic reaction in the lung (farmer's lung) that can lead to pulmonary fibrosis. °· Inhaling toxic fumes. °· Certain medicines. These include drugs used in radiation therapy or used to treat seizures, heart problems, and some infections. °· Autoimmune diseases, such as rheumatoid arthritis, systemic sclerosis, and connective tissue diseases. °· Sarcoidosis. In this disease, areas of inflammatory cells (granulomas) form and most often affect the lungs. °· Genes. Some cases of pulmonary fibrosis may be passed down through families. ° °What increases the risk? °You may be at a higher risk for developing pulmonary fibrosis if: °· You have a family history of the disease. °· You have an autoimmune disease or another condition linked to pulmonary fibrosis. °· You are exposed to certain substances or fumes found in agricultural, farm, construction, or factory work. °· You take certain medicines. ° °What are the signs or symptoms? °Symptoms may include: °· Difficulty breathing that gets worse with activity. °· Dry, hacking cough. °· Rapid, shallow breathing during exercise or while at rest. °· Shortness of breath that gets worse (dyspnea). °· Bluish skin and lips. °· Loss of appetite. °· Loss of strength. °· Weight loss and  fatigue. °· Rounded and enlarged fingertips (clubbing). ° °How is this diagnosed? °Your health care provider may suspect pulmonary fibrosis based on your symptoms and medical history. Diagnosis may include a physical exam. Your health care provider will check for signs that strongly suggest that you have pulmonary fibrosis, such as: °· Blue skin around your fingernails or mouth from reduced oxygen. °· Clubbing around the ends of your fingers. °· A crackling sound when you breathe. ° °Your health care provider may also do tests to confirm the diagnosis. These may include: °· Looking inside your lungs with an instrument (bronchoscopy). °· Imaging studies of your lungs and heart using: °? X-rays. °? CT scan. °? Sound waves (echocardiogram). °· Tests to measure how well you are breathing (pulmonary function tests). °· Exercise testing to see how well your lungs work while you are walking. °· Blood tests. °· A procedure to remove a small piece of lung tissue to examine in a lab (biopsy). ° °How is this treated? °There is no cure for pulmonary fibrosis. Treatments focus on managing symptoms and preventing scarring from getting worse. This can include: °· Medicines. °? You may take steroids to prevent permanent lung changes. Your health care provider may put you on a high dose at first, then on lower dosages for the long term. °? Medicines to suppress your body’s defense (immune) system. These can have serious side effects. °· You may be monitored with X-rays and laboratory work. °· Oxygen therapy may be helpful if oxygen in your blood is low. °· Surgery. In some cases, a lung transplant is an option. ° °Follow these instructions at home: °· Take medicines only as   directed by your health care provider. °· Keep your vaccinations up to date as recommended by your health care provider. °· Do not use any tobacco products, including cigarettes, chewing tobacco, or electronic cigarettes. If you need help quitting, ask your  health care provider. °· Get regular exercise, but do not overexert yourself. Ask your health care provider to suggest some activities that are safe for you to do. Walking and chair exercises can help if you have physical limitations. °· Consider joining a pulmonary rehabilitation program or a support group for people with pulmonary fibrosis. °· Eat small meals often so you do not get too full. Overeating can make breathing trouble worse. °· Maintain a healthy weight. Lose weight if you need to. °· Do breathing exercises as directed by your health care provider. °· Keep all follow-up visits as directed by your health care provider. This is important. °Contact a health care provider if: °· You are not able to be as active as usual. °· You have a long-lasting (chronic) cough. °· You are often short of breath. °· You have a fever or chills. °Get help right away if: °· You have chest pain. °· Your breathing is much worse. °· You cannot take a deep breath. °· You have blue skin around your mouth or fingers. °· You have clubbing of your fingers. °· You cough up mucus that is dark in color. °· You have a lot of headaches. °· You get very confused or sleepy. °This information is not intended to replace advice given to you by your health care provider. Make sure you discuss any questions you have with your health care provider. °Document Released: 05/29/2003 Document Revised: 08/14/2015 Document Reviewed: 08/16/2013 °Elsevier Interactive Patient Education © 2018 [employer] ° °

## 2017-10-28 ENCOUNTER — Ambulatory Visit: Payer: Medicare Other

## 2017-10-28 DIAGNOSIS — R0602 Shortness of breath: Secondary | ICD-10-CM

## 2017-10-28 DIAGNOSIS — I2721 Secondary pulmonary arterial hypertension: Secondary | ICD-10-CM | POA: Diagnosis not present

## 2017-11-01 ENCOUNTER — Ambulatory Visit (INDEPENDENT_AMBULATORY_CARE_PROVIDER_SITE_OTHER): Payer: Medicare Other | Admitting: Internal Medicine

## 2017-11-01 ENCOUNTER — Encounter: Payer: Self-pay | Admitting: Internal Medicine

## 2017-11-01 VITALS — BP 130/80 | HR 94 | Resp 16 | Ht 66.5 in | Wt 152.0 lb

## 2017-11-01 DIAGNOSIS — I2721 Secondary pulmonary arterial hypertension: Secondary | ICD-10-CM | POA: Diagnosis not present

## 2017-11-01 DIAGNOSIS — J84112 Idiopathic pulmonary fibrosis: Secondary | ICD-10-CM

## 2017-11-01 DIAGNOSIS — R0602 Shortness of breath: Secondary | ICD-10-CM

## 2017-11-01 DIAGNOSIS — J9611 Chronic respiratory failure with hypoxia: Secondary | ICD-10-CM | POA: Diagnosis not present

## 2017-11-01 NOTE — Progress Notes (Signed)
[facility] [address] [place], [place] [address]  Pulmonary Sleep Medicine   Office Visit Note  Patient Name: Vincent Kennedy DOB: [birth year] MRN [id number]  Date of Service: 11/01/2017  Complaints/HPI:  Patient is here for follow-up after echocardiogram.  The echocardiogram basically shows that he has mild pulmonary hypertension.  It does not appear to have significantly changed from his previous echocardiograms.  We had discuss the possibility of treating the pulmonary hypertension however based on current recommendations it is unlikely that he is going to gain benefit from treatment of mild pulmonary hypertension secondary to his pulmonary fibrosis and oxygen dependence.  I reviewed the current recommendations with the patient and with his wife who was present in the room.  He may benefit however from cardiopulmonary rehab so I think he should be referred back to cardiopulmonary rehab.  In addition we did walk him with 6 L oxygen and he still did drop his saturations significantly.  ROS  General: (-) fever, (-) chills, (-) night sweats, (-) weakness Skin: (-) rashes, (-) itching,. Eyes: (-) visual changes, (-) redness, (-) itching. Nose and Sinuses: (-) nasal stuffiness or itchiness, (-) postnasal drip, (-) nosebleeds, (-) sinus trouble. Mouth and Throat: (-) sore throat, (-) hoarseness. Neck: (-) swollen glands, (-) enlarged thyroid, (-) neck pain. Respiratory: + cough, (-) bloody sputum, + shortness of breath, - wheezing. Cardiovascular: - ankle swelling, (-) chest pain. Lymphatic: (-) lymph node enlargement. Neurologic: (-) numbness, (-) tingling. Psychiatric: (-) anxiety, (-) depression   Current Medication: Outpatient Encounter Medications as of 11/01/2017  Medication Sig  . aspirin EC 81 MG tablet Take 1 tablet (81 mg total) by mouth daily.  . budesonide (PULMICORT) 0.5 MG/2ML nebulizer solution Take 2 mLs (0.5 mg total) by nebulization 2 (two) times daily.   . clopidogrel (PLAVIX) 75 MG tablet Take 1 tablet (75 mg total) by mouth daily.  . finasteride (PROSCAR) 5 MG tablet Take 1 tablet (5 mg total) by mouth daily.  . metoprolol succinate (TOPROL-XL) 25 MG 24 hr tablet Take 25 mg by mouth 3 (three) times daily.  . mometasone-formoterol (DULERA) 100-5 MCG/ACT AERO Inhale 2 puffs into the lungs 2 (two) times daily.  . nitroGLYCERIN (NITROSTAT) 0.4 MG SL tablet Place 1 tablet (0.4 mg total) under the tongue every 5 (five) minutes as needed for chest pain.  . pantoprazole (PROTONIX) 40 MG tablet Take 1 tablet (40 mg total) by mouth daily. (Patient taking differently: Take 40 mg by mouth daily as needed (heartburn). )  . Pirfenidone (ESBRIET) 267 MG CAPS Take 801 mg by mouth 3 (three) times daily.   . rivastigmine (EXELON) 9.5 mg/24hr Place 1 patch (9.5 mg total) onto the skin daily.  . rosuvastatin (CRESTOR) 40 MG tablet Take 40 mg by mouth every day  . zolpidem (AMBIEN) 10 MG tablet TAKE 1 TABLET BY MOUTH AT  BEDTIME AS NEEDED  . metoprolol tartrate (LOPRESSOR) 25 MG tablet Take 1 tablet (25 mg total) by mouth 2 (two) times daily.   No facility-administered encounter medications on file as of 11/01/2017.     Surgical History: Past Surgical History:  Procedure Laterality Date  . CATARACT EXTRACTION W/PHACO Right 01/29/2015   Procedure: CATARACT EXTRACTION PHACO AND INTRAOCULAR LENS PLACEMENT (IOC);  Surgeon: [name], MD;  [facility];  Service: Ophthalmology;  Laterality: Right;  RESTOR LENS CPAP  . CATARACT EXTRACTION W/PHACO Left 03/12/2015   Procedure: CATARACT EXTRACTION PHACO AND INTRAOCULAR LENS PLACEMENT (IOC);  Surgeon: [name],  MD;  Location: [facility];  Service: Ophthalmology;  Laterality: Left;  RESTOR SHUGARCAINE  . COLONOSCOPY WITH ESOPHAGOGASTRODUODENOSCOPY (EGD)    . CORONARY ANGIOPLASTY WITH STENT PLACEMENT  2010   "1 stent"  . CORONARY ANGIOPLASTY WITH STENT PLACEMENT  01/19/2017   . CORONARY ATHERECTOMY N/A 01/19/2017   Procedure: CORONARY ATHERECTOMY - CSI;  Surgeon: [name], Muhammad A, MD;  Location: [facility];  Service: Cardiovascular;  Laterality: N/A;  unable to cross lesion  . CORONARY STENT INTERVENTION N/A 01/19/2017   Procedure: CORONARY STENT INTERVENTION;  Surgeon: [name], Muhammad A, MD;  Location: [facility];  Service: Cardiovascular;  Laterality: N/A;  . INGUINAL HERNIA REPAIR Bilateral   . LEFT HEART CATH AND CORONARY ANGIOGRAPHY Left 09/12/2017   Procedure: LEFT HEART CATH AND CORONARY ANGIOGRAPHY;  Surgeon: [name], Muhammad A, MD;  Location: [facility];  Service: Cardiovascular;  Laterality: Left;  . RIGHT/LEFT HEART CATH AND CORONARY ANGIOGRAPHY N/A 01/12/2017   Procedure: RIGHT/LEFT HEART CATH AND CORONARY ANGIOGRAPHY;  Surgeon: [name], Muhammad A, MD;  Location: [facility];  Service: Cardiovascular;  Laterality: N/A;    Medical History: Past Medical History:  Diagnosis Date  . Abnormal nuclear cardiac imaging test    Dr. Kirke CorinArida  . BPH (benign prostatic hyperplasia)   . Coronary artery disease    Cardiac cath September 2008: 20% left main stenosis, 40% mid LAD stenosis, 99% ostial first diagonal stenosis in a large branch and 90% ostial disease in second diagonal but the vessel was very small, 20% mid RCA stenosis with normal ejection fraction. Successful angioplasty and drug-eluting stent placement to the ostial first diagonal with a 2.5 x 15 mm Xience stent  . GERD (gastroesophageal reflux disease)   . Hyperlipidemia   . Hypertension    BP has never been high(per pt).  Metoprolol is to keep BP low because of stent (afterload)  . On home oxygen therapy    "2-3L; 24/7" (01/19/2017)  . OSA on CPAP   . Oxygen deficiency   . Pneumonia 07/2016  . Pulmonary fibrosis (HCC) 08/26/2015  . Shortness of breath dyspnea    with exertion  . Wears hearing aid    (sometimes)    Family History: Family History  Problem  Relation Age of Onset  . CAD Father   . CAD Brother     Social History: Social History   Socioeconomic History  . Marital status: Married    Spouse name: Not on file  . Number of children: Not on file  . Years of education: Not on file  . Highest education level: Not on file  Occupational History  . Occupation: retired  Engineer, productionocial Needs  . Financial resource strain: Not on file  . Food insecurity:    Worry: Not on file    Inability: Not on file  . Transportation needs:    Medical: Not on file    Non-medical: Not on file  Tobacco Use  . Smoking status: Never Smoker  . Smokeless tobacco: Never Used  Substance and Sexual Activity  . Alcohol use: No    Alcohol/week: 0.0 standard drinks  . Drug use: No  . Sexual activity: Yes    Comment: patient states minor  Lifestyle  . Physical activity:    Days per week: Not on file    Minutes per session: Not on file  . Stress: Not on file  Relationships  . Social connections:    Talks on phone: Not on file  Gets together: Not on file    Attends religious service: Not on file    Active member of club or organization: Not on file    Attends meetings of clubs or organizations: Not on file    Relationship status: Not on file  . Intimate partner violence:    Fear of current or ex partner: Not on file    Emotionally abused: Not on file    Physically abused: Not on file    Forced sexual activity: Not on file  Other Topics Concern  . Not on file  Social History Narrative  . Not on file    Vital Signs: Blood pressure 130/80, pulse 94, resp. rate 16, height 5' 6.5" (1.689 m), weight 152 lb (68.9 kg), SpO2 95 %.  Examination: General Appearance: The patient is well-developed, well-nourished, and in no distress. Skin: Gross inspection of skin unremarkable. Head: normocephalic, no gross deformities. Eyes: no gross deformities noted. ENT: ears appear grossly normal no exudates. Neck: Supple. No thyromegaly. No LAD. Respiratory:  +crackles noted. Cardiovascular: Normal S1 and S2 without murmur or rub. Extremities: No cyanosis. pulses are equal. Neurologic: Alert and oriented. No involuntary movements.  LABS: Recent Results (from the past 2160 hour(s))  Basic metabolic panel     Status: None   Collection Time: 09/02/17  9:58 AM  Result Value Ref Range   Glucose 92 65 - 99 mg/dL    Comment: Specimen received hemolyzed. Clinical correlation indicated.   BUN 12 8 - 27 mg/dL   Creatinine, Ser 1.61 0.76 - 1.27 mg/dL   GFR calc non Af Amer 82 >59 mL/min/1.73   GFR calc Af Amer 95 >59 mL/min/1.73   BUN/Creatinine Ratio 15 10 - 24   Sodium 140 134 - 144 mmol/L   Potassium 4.3 3.5 - 5.2 mmol/L    Comment: Specimen received hemolyzed. Clinical correlation indicated.   Chloride 104 96 - 106 mmol/L   CO2 20 20 - 29 mmol/L   Calcium 9.1 8.6 - 10.2 mg/dL  CBC with Differential/Platelet     Status: None   Collection Time: 09/02/17  9:58 AM  Result Value Ref Range   WBC 7.4 3.4 - 10.8 x10E3/uL   RBC 4.89 4.14 - 5.80 x10E6/uL   Hemoglobin 14.0 13.0 - 17.7 g/dL   Hematocrit 09.6 04.5 - 51.0 %   MCV 87 79 - 97 fL   MCH 28.6 26.6 - 33.0 pg   MCHC 32.9 31.5 - 35.7 g/dL   RDW 40.9 81.1 - 91.4 %   Platelets 213 150 - 450 x10E3/uL   Neutrophils 70 Not Estab. %   Lymphs 20 Not Estab. %   Monocytes 6 Not Estab. %   Eos 4 Not Estab. %   Basos 0 Not Estab. %   Neutrophils Absolute 5.2 1.4 - 7.0 x10E3/uL   Lymphocytes Absolute 1.4 0.7 - 3.1 x10E3/uL   Monocytes Absolute 0.4 0.1 - 0.9 x10E3/uL   EOS (ABSOLUTE) 0.3 0.0 - 0.4 x10E3/uL   Basophils Absolute 0.0 0.0 - 0.2 x10E3/uL   Immature Granulocytes 0 Not Estab. %   Immature Grans (Abs) 0.0 0.0 - 0.1 x10E3/uL    Radiology: No results found.  No results found.  No results found.    Assessment and Plan: Patient Active Problem List   Diagnosis Date Noted  . Effort angina (HCC) 01/19/2017  . CAD S/P percutaneous coronary angioplasty 11/25/2016  . Cataracts,  bilateral 11/25/2016  . Chronic hypoxemic respiratory failure (HCC) 11/25/2016  . GERD (gastroesophageal reflux  disease) 11/25/2016  . Inguinal hernia 11/25/2016  . OSA on CPAP 11/25/2016  . Urinary retention   . Pneumonia 08/15/2016  . Need for vaccination for Strep pneumoniae 04/28/2016  . Incomplete emptying of bladder 09/10/2015  . Pulmonary fibrosis (HCC) 08/26/2015  . Medication monitoring encounter 08/26/2015  . Elevated antinuclear antibody (ANA) level 04/11/2015  . Dyspnea 03/18/2015  . Coronary artery disease involving native coronary artery of native heart without angina pectoris 10/16/2014  . Auditory disturbance 10/16/2014  . Hypertension goal BP (blood pressure) < 150/90 10/16/2014  . Alzheimer's dementia without behavioral disturbance 10/16/2014  . Status post insertion of drug eluting coronary artery stent 10/16/2014  . Medicare annual wellness visit, subsequent 10/16/2014  . Coronary artery disease   . Hyperlipidemia     1. IPF  Severe disease patient has had clinical decline with severe oxygen desaturations.  He should turn his oxygen up to 5-6 L with exertion.  If he is sitting still he actually does fine with good saturations.  In addition now will refer him for cardiopulmonary rehab to help improve his exercise tolerance. 2. SOB  As noted above secondary to combination of mild pulmonary hypertension and hypoxia secondary to his underlying pulmonary fibrosis continue with oxygen therapy 3. PAH mild pulmonary hypertension which is been pretty steady over the course of the last 2 years.  It do not think he has a candidate for therapy at this point because the risks after probably greater than the benefits as noted above I discuss the current recommendations and he is currently not correct commended candidate 4. Chronic respiratory failure with hypoxia continue with oxygen therapy as discussed  General Counseling: I have discussed the findings of the evaluation and  examination with [facility].  I have also discussed any further diagnostic evaluation thatmay be needed or ordered today. Jwan verbalizes understanding of the findings of todays visit. We also reviewed his medications today and discussed drug interactions and side effects including but not limited excessive drowsiness and altered mental states. We also discussed that there is always a risk not just to him but also people around him. he has been encouraged to call the office with any questions or concerns that should arise related to todays visit.    Time spent:  I have personally obtained a history, examined the patient, evaluated laboratory and imaging results, formulated the assessment and plan and placed orders.    Yevonne Pax, MD [facility] Pulmonary and Critical Care Sleep medicine

## 2017-11-03 ENCOUNTER — Ambulatory Visit: Payer: Medicare Other | Admitting: Cardiovascular Disease

## 2017-11-07 ENCOUNTER — Other Ambulatory Visit: Payer: Self-pay

## 2017-11-07 ENCOUNTER — Encounter: Payer: Medicare Other | Attending: Internal Medicine

## 2017-11-07 VITALS — Ht 65.5 in | Wt 150.7 lb

## 2017-11-07 DIAGNOSIS — I2721 Secondary pulmonary arterial hypertension: Secondary | ICD-10-CM | POA: Insufficient documentation

## 2017-11-07 NOTE — Patient Instructions (Addendum)
Patient Instructions  Patient Details  Name: Vincent Kennedy MRN: [id number] Date of Birth: 1933/03/11 Referring Provider:  Yevonne PaxKhan, Saadat A, MD  Below are your personal goals for exercise, nutrition, and risk factors. Our goal is to help you stay on track towards obtaining and maintaining these goals. We will be discussing your progress on these goals with you throughout the program.  Initial Exercise Prescription: Initial Exercise Prescription - 11/07/17 1500      Date of Initial Exercise RX and Referring Provider   Date  11/07/17    Referring Provider  Welton FlakesKhan      Treadmill   MPH  1.5    Grade  0    Minutes  15    METs  2.15      NuStep   Level  2    SPM  80    Minutes  15    METs  2      Arm Ergometer   Level  1    RPM  40    Minutes  15    METs  2      Prescription Details   Frequency (times per week)  3    Duration  Progress to 45 minutes of aerobic exercise without signs/symptoms of physical distress      Intensity   THRR 40-80% of Max Heartrate  93-122    Ratings of Perceived Exertion  11-13    Perceived Dyspnea  0-4      Resistance Training   Training Prescription  Yes    Weight  2 lb    Reps  10-15       Exercise Goals: Frequency: Be able to perform aerobic exercise two to three times per week in program working toward 2-5 days per week of home exercise.  Intensity: Work with a perceived exertion of 11 (fairly light) - 15 (hard) while following your exercise prescription.  We will make changes to your prescription with you as you progress through the program.   Duration: Be able to do 30 to 45 minutes of continuous aerobic exercise in addition to a 5 minute warm-up and a 5 minute cool-down routine.   Nutrition Goals: Your personal nutrition goals will be established when you do your nutrition analysis with the dietician.  The following are general nutrition guidelines to follow: Cholesterol < 200mg /day Sodium < 1500mg /day Fiber: Men over 50 yrs -  30 grams per day  Personal Goals: Personal Goals and Risk Factors at Admission - 11/07/17 1509      Core Components/Risk Factors/Patient Goals on Admission    Weight Management  Yes;Weight Maintenance    Intervention  Weight Management: Develop a combined nutrition and exercise program designed to reach desired caloric intake, while maintaining appropriate intake of nutrient and fiber, sodium and fats, and appropriate energy expenditure required for the weight goal.;Weight Management: Provide education and appropriate resources to help participant work on and attain dietary goals.;Weight Management/Obesity: Establish reasonable short term and long term weight goals.    Admit Weight  150 lb 11.2 oz (68.4 kg)    Goal Weight: Short Term  150 lb (68 kg)    Goal Weight: Long Term  150 lb (68 kg)    Expected Outcomes  Weight Maintenance: Understanding of the daily nutrition guidelines, which includes 25-35% calories from fat, 7% or less cal from saturated fats, less than 200mg  cholesterol, less than 1.5gm of sodium, & 5 or more servings of fruits and vegetables daily;Long Term: Adherence to nutrition  and physical activity/exercise program aimed toward attainment of established weight goal;Short Term: Continue to assess and modify interventions until short term weight is achieved;Understanding recommendations for meals to include 15-35% energy as protein, 25-35% energy from fat, 35-60% energy from carbohydrates, less than 200mg  of dietary cholesterol, 20-35 gm of total fiber daily;Understanding of distribution of calorie intake throughout the day with the consumption of 4-5 meals/snacks    Improve shortness of breath with ADL's  Yes    Intervention  Provide education, individualized exercise plan and daily activity instruction to help decrease symptoms of SOB with activities of daily living.    Expected Outcomes  Short Term: Improve cardiorespiratory fitness to achieve a reduction of symptoms when performing  ADLs;Long Term: Be able to perform more ADLs without symptoms or delay the onset of symptoms    Hypertension  Yes    Intervention  Provide education on lifestyle modifcations including regular physical activity/exercise, weight management, moderate sodium restriction and increased consumption of fresh fruit, vegetables, and low fat dairy, alcohol moderation, and smoking cessation.;Monitor prescription use compliance.    Expected Outcomes  Short Term: Continued assessment and intervention until BP is < 140/3890mm HG in hypertensive participants. < 130/2580mm HG in hypertensive participants with diabetes, heart failure or chronic kidney disease.;Long Term: Maintenance of blood pressure at goal levels.    Lipids  Yes    Intervention  Provide education and support for participant on nutrition & aerobic/resistive exercise along with prescribed medications to achieve LDL 70mg , HDL >40mg .    Expected Outcomes  Short Term: Participant states understanding of desired cholesterol values and is compliant with medications prescribed. Participant is following exercise prescription and nutrition guidelines.;Long Term: Cholesterol controlled with medications as prescribed, with individualized exercise RX and with personalized nutrition plan. Value goals: LDL < 70mg , HDL > 40 mg.       Tobacco Use Initial Evaluation: Social History   Tobacco Use  Smoking Status Never Smoker  Smokeless Tobacco Never Used    Exercise Goals and Review: Exercise Goals    Row Name 11/07/17 1520             Exercise Goals   Increase Physical Activity  Yes       Intervention  Provide advice, education, support and counseling about physical activity/exercise needs.;Develop an individualized exercise prescription for aerobic and resistive training based on initial evaluation findings, risk stratification, comorbidities and participant's personal goals.       Expected Outcomes  Short Term: Attend rehab on a regular basis to increase  amount of physical activity.;Long Term: Add in home exercise to make exercise part of routine and to increase amount of physical activity.;Long Term: Exercising regularly at least 3-5 days a week.       Increase Strength and Stamina  Yes       Intervention  Provide advice, education, support and counseling about physical activity/exercise needs.;Develop an individualized exercise prescription for aerobic and resistive training based on initial evaluation findings, risk stratification, comorbidities and participant's personal goals.       Expected Outcomes  Short Term: Increase workloads from initial exercise prescription for resistance, speed, and METs.;Short Term: Perform resistance training exercises routinely during rehab and add in resistance training at home;Long Term: Improve cardiorespiratory fitness, muscular endurance and strength as measured by increased METs and functional capacity (6MWT)       Able to understand and use rate of perceived exertion (RPE) scale  Yes       Intervention  Provide education and  explanation on how to use RPE scale       Expected Outcomes  Short Term: Able to use RPE daily in rehab to express subjective intensity level;Long Term:  Able to use RPE to guide intensity level when exercising independently       Able to understand and use Dyspnea scale  Yes       Intervention  Provide education and explanation on how to use Dyspnea scale       Expected Outcomes  Short Term: Able to use Dyspnea scale daily in rehab to express subjective sense of shortness of breath during exertion;Long Term: Able to use Dyspnea scale to guide intensity level when exercising independently       Knowledge and understanding of Target Heart Rate Range (THRR)  Yes       Intervention  Provide education and explanation of THRR including how the numbers were predicted and where they are located for reference       Expected Outcomes  Short Term: Able to state/look up THRR;Short Term: Able to use  daily as guideline for intensity in rehab;Long Term: Able to use THRR to govern intensity when exercising independently       Able to check pulse independently  Yes       Intervention  Provide education and demonstration on how to check pulse in carotid and radial arteries.;Review the importance of being able to check your own pulse for safety during independent exercise       Expected Outcomes  Short Term: Able to explain why pulse checking is important during independent exercise;Long Term: Able to check pulse independently and accurately       Understanding of Exercise Prescription  Yes       Intervention  Provide education, explanation, and written materials on patient's individual exercise prescription       Expected Outcomes  Short Term: Able to explain program exercise prescription;Long Term: Able to explain home exercise prescription to exercise independently          Copy of goals given to participant.

## 2017-11-07 NOTE — Progress Notes (Signed)
Daily Session Note  Patient Details  Name: Vincent Kennedy MRN: [id number] Date of Birth: Dec 02, 1932 Referring Provider:     Cardiac Rehab from 01/24/2017 in [facility] Cardiac and Pulmonary Rehab  Referring Provider  Kathlyn Sacramento MD      Encounter Date: 11/07/2017  Check In:      Social History   Tobacco Use  Smoking Status Never Smoker  Smokeless Tobacco Never Used    Goals Met:  Exercise tolerated well Personal goals reviewed No report of cardiac concerns or symptoms Strength training completed today  Goals Unmet:  Not Applicable  Comments: Time of Service 1430-1530   Dr. Emily Filbert is Medical Director for [facility] and [name].

## 2017-11-07 NOTE — Progress Notes (Signed)
Pulmonary Individual Treatment Plan  Patient Details  Name: Vincent Kennedy MRN: [id number] Date of Birth: 1932/05/10 Referring Provider:     Pulmonary Rehab from 11/07/2017 in [facility] and Pulmonary Rehab  Referring Provider  [name]      Initial Encounter Date:    Pulmonary Rehab from 11/07/2017 in [facility] Cardiac and Pulmonary Rehab  Date  11/07/17      Visit Diagnosis: Pulmonary artery hypertension ([place])  Patient's Home Medications on Admission:  Current Outpatient Medications:  .  aspirin EC 81 MG tablet, Take 1 tablet (81 mg total) by mouth daily., Disp: 30 tablet, Rfl: 6 .  budesonide (PULMICORT) 0.5 MG/2ML nebulizer solution, Take 2 mLs (0.5 mg total) by nebulization 2 (two) times daily., Disp: 260 mL, Rfl: 3 .  clopidogrel (PLAVIX) 75 MG tablet, Take 1 tablet (75 mg total) by mouth daily., Disp: 90 tablet, Rfl: 3 .  finasteride (PROSCAR) 5 MG tablet, Take 1 tablet (5 mg total) by mouth daily., Disp: 90 tablet, Rfl: 3 .  metoprolol succinate (TOPROL-XL) 25 MG 24 hr tablet, Take 25 mg by mouth 3 (three) times daily., Disp: , Rfl:  .  metoprolol tartrate (LOPRESSOR) 25 MG tablet, Take 1 tablet (25 mg total) by mouth 2 (two) times daily., Disp: 180 tablet, Rfl: 3 .  mometasone-formoterol (DULERA) 100-5 MCG/ACT AERO, Inhale 2 puffs into the lungs 2 (two) times daily., Disp: 1 Inhaler, Rfl: 1 .  nitroGLYCERIN (NITROSTAT) 0.4 MG SL tablet, Place 1 tablet (0.4 mg total) under the tongue every 5 (five) minutes as needed for chest pain., Disp: 25 tablet, Rfl: 12 .  pantoprazole (PROTONIX) 40 MG tablet, Take 1 tablet (40 mg total) by mouth daily. (Patient taking differently: Take 40 mg by mouth daily as needed (heartburn). ), Disp: 30 tablet, Rfl: 6 .  Pirfenidone (ESBRIET) 267 MG CAPS, Take 801 mg by mouth 3 (three) times daily. , Disp: , Rfl:  .  rivastigmine (EXELON) 9.5 mg/24hr, Place 1 patch (9.5 mg total) onto the skin daily., Disp: 90 patch, Rfl: 2 .  rosuvastatin (CRESTOR) 40 MG  tablet, Take 40 mg by mouth every day, Disp: 90 tablet, Rfl: 3 .  zolpidem (AMBIEN) 10 MG tablet, TAKE 1 TABLET BY MOUTH AT  BEDTIME AS NEEDED, Disp: 90 tablet, Rfl: 2  Past Medical History: Past Medical History:  Diagnosis Date  . Abnormal nuclear cardiac imaging test    Dr. Fletcher Anon  . BPH (benign prostatic hyperplasia)   . Coronary artery disease    Cardiac cath September 2008: 20% left main stenosis, 40% mid LAD stenosis, 99% ostial first diagonal stenosis in a large branch and 90% ostial disease in second diagonal but the vessel was very small, 20% mid RCA stenosis with normal ejection fraction. Successful angioplasty and drug-eluting stent placement to the ostial first diagonal with a 2.5 x 15 mm Xience stent  . GERD (gastroesophageal reflux disease)   . Hyperlipidemia   . Hypertension    BP has never been high(per pt).  Metoprolol is to keep BP low because of stent (afterload)  . On home oxygen therapy    "2-3L; 24/7" (01/19/2017)  . OSA on CPAP   . Oxygen deficiency   . Pneumonia 07/2016  . Pulmonary fibrosis ([place]) 08/26/2015  . Shortness of breath dyspnea    with exertion  . Wears hearing aid    (sometimes)    Tobacco Use: Social History   Tobacco Use  Smoking Status Never Smoker  Smokeless Tobacco Never Used  Labs: Recent Review Flowsheet Data    Labs for ITP Cardiac and Pulmonary Rehab Latest Ref Rng & Units 01/12/2017 01/12/2017 01/12/2017 01/12/2017 03/11/2017   Cholestrol 100 - 199 mg/dL - - - - 131   LDLCALC 0 - 99 mg/dL - - - - 73   HDL >39 mg/dL - - - - 38(L)   Trlycerides 0 - 149 mg/dL - - - - 99   PHART 7.350 - 7.450 7.378 - - - -   PCO2ART 32.0 - 48.0 mmHg 41.3 - - - -   HCO3 20.0 - 28.0 mmol/L 24.3 23.8 24.1 25.3 -   TCO2 22 - 32 mmol/L 26 25 25 27 -   ACIDBASEDEF 0.0 - 2.0 mmol/L 1.0 2.0 1.0 1.0 -   O2SAT % 98.0 77.0 76.0 64.0 -       Pulmonary Assessment Scores: Pulmonary Assessment Scores    Row Name 11/07/17 1448         ADL UCSD   ADL  Phase  Entry     SOB Score total  25     Rest  0     Walk  1     Stairs  2     Bath  1     Dress  1     Shop  2       CAT Score   CAT Score  11       mMRC Score   mMRC Score  2        Pulmonary Function Assessment: Pulmonary Function Assessment - 11/07/17 1509      Breath   Bilateral Breath Sounds  Clear;Decreased    Shortness of Breath  Yes;Fear of Shortness of Breath;Limiting activity       Exercise Target Goals: Exercise Program Goal: Individual exercise prescription set using results from initial 6 min walk test and THRR while considering  patient's activity barriers and safety.   Exercise Prescription Goal: Initial exercise prescription builds to 30-45 minutes a day of aerobic activity, 2-3 days per week.  Home exercise guidelines will be given to patient during program as part of exercise prescription that the participant will acknowledge.  Activity Barriers & Risk Stratification:   6 Minute Walk: 6 Minute Walk    Row Name 11/07/17 1520         6 Minute Walk   Distance  542 feet     Walk Time  3.6 minutes     # of Rest Breaks  1     MPH  1.7     METS  2.3     RPE  13     Perceived Dyspnea   1     VO2 Peak  3.46     Symptoms  Yes (comment)     Comments  O2 dropped during test     Resting HR  64 bpm     Resting BP  104/58     Resting Oxygen Saturation   95 %     Exercise Oxygen Saturation  during 6 min walk  78 %     Max Ex. HR  97 bpm     Max Ex. BP  134/66     2 Minute Post BP  122/64       Interval HR   1 Minute HR  97     2 Minute HR  97     4 Minute HR  83     5 Minute HR  71       6 Minute HR  95     2 Minute Post HR  68     Interval Heart Rate?  Yes       Interval Oxygen   Interval Oxygen?  Yes     Baseline Oxygen Saturation %  95 %     1 Minute Oxygen Saturation %  89 %     1 Minute Liters of Oxygen  4 L pulsed     2 Minute Oxygen Saturation %  79 %     2 Minute Liters of Oxygen  4 L     3 Minute Liters of Oxygen  4 L     4  Minute Oxygen Saturation %  78 %     4 Minute Liters of Oxygen  4 L     5 Minute Oxygen Saturation %  82 %     5 Minute Liters of Oxygen  4 L     6 Minute Oxygen Saturation %  88 %     6 Minute Liters of Oxygen  4 L     2 Minute Post Oxygen Saturation %  93 %     2 Minute Post Liters of Oxygen  4 L       Oxygen Initial Assessment: Oxygen Initial Assessment - 11/07/17 1507      Home Oxygen   Home Oxygen Device  Portable Concentrator;Home Concentrator;E-Tanks    Sleep Oxygen Prescription  CPAP    Liters per minute  3    Home Exercise Oxygen Prescription  Continuous    Liters per minute  4    Home at Rest Exercise Oxygen Prescription  Continuous    Liters per minute  3    Compliance with Home Oxygen Use  Yes      Initial 6 min Walk   Oxygen Used  Portable Concentrator;Pulsed    Liters per minute  5      Program Oxygen Prescription   Program Oxygen Prescription  Continuous    Liters per minute  5      Intervention   Short Term Goals  To learn and demonstrate proper use of respiratory medications;To learn and understand importance of maintaining oxygen saturations>88%;To learn and exhibit compliance with exercise, home and travel O2 prescription;To learn and understand importance of monitoring SPO2 with pulse oximeter and demonstrate accurate use of the pulse oximeter.;To learn and demonstrate proper pursed lip breathing techniques or other breathing techniques.    Long  Term Goals  Compliance with respiratory medication;Demonstrates proper use of MDI's;Exhibits proper breathing techniques, such as pursed lip breathing or other method taught during program session;Maintenance of O2 saturations>88%;Verbalizes importance of monitoring SPO2 with pulse oximeter and return demonstration;Exhibits compliance with exercise, home and travel O2 prescription       Oxygen Re-Evaluation:   Oxygen Discharge (Final Oxygen Re-Evaluation):   Initial Exercise Prescription: Initial Exercise  Prescription - 11/07/17 1500      Date of Initial Exercise RX and Referring Provider   Date  11/07/17    Referring Provider  Khan      Treadmill   MPH  1.5    Grade  0    Minutes  15    METs  2.15      NuStep   Level  2    SPM  80    Minutes  15    METs  2      Arm Ergometer   Level  1    RPM  40      Minutes  15    METs  2      Prescription Details   Frequency (times per week)  3    Duration  Progress to 45 minutes of aerobic exercise without signs/symptoms of physical distress      Intensity   THRR 40-80% of Max Heartrate  93-122    Ratings of Perceived Exertion  11-13    Perceived Dyspnea  0-4      Resistance Training   Training Prescription  Yes    Weight  2 lb    Reps  10-15       Perform Capillary Blood Glucose checks as needed.  Exercise Prescription Changes:   Exercise Comments:   Exercise Goals and Review: Exercise Goals    Row Name 11/07/17 1520             Exercise Goals   Increase Physical Activity  Yes       Intervention  Provide advice, education, support and counseling about physical activity/exercise needs.;Develop an individualized exercise prescription for aerobic and resistive training based on initial evaluation findings, risk stratification, comorbidities and participant's personal goals.       Expected Outcomes  Short Term: Attend rehab on a regular basis to increase amount of physical activity.;Long Term: Add in home exercise to make exercise part of routine and to increase amount of physical activity.;Long Term: Exercising regularly at least 3-5 days a week.       Increase Strength and Stamina  Yes       Intervention  Provide advice, education, support and counseling about physical activity/exercise needs.;Develop an individualized exercise prescription for aerobic and resistive training based on initial evaluation findings, risk stratification, comorbidities and participant's personal goals.       Expected Outcomes  Short Term:  Increase workloads from initial exercise prescription for resistance, speed, and METs.;Short Term: Perform resistance training exercises routinely during rehab and add in resistance training at home;Long Term: Improve cardiorespiratory fitness, muscular endurance and strength as measured by increased METs and functional capacity (6MWT)       Able to understand and use rate of perceived exertion (RPE) scale  Yes       Intervention  Provide education and explanation on how to use RPE scale       Expected Outcomes  Short Term: Able to use RPE daily in rehab to express subjective intensity level;Long Term:  Able to use RPE to guide intensity level when exercising independently       Able to understand and use Dyspnea scale  Yes       Intervention  Provide education and explanation on how to use Dyspnea scale       Expected Outcomes  Short Term: Able to use Dyspnea scale daily in rehab to express subjective sense of shortness of breath during exertion;Long Term: Able to use Dyspnea scale to guide intensity level when exercising independently       Knowledge and understanding of Target Heart Rate Range (THRR)  Yes       Intervention  Provide education and explanation of THRR including how the numbers were predicted and where they are located for reference       Expected Outcomes  Short Term: Able to state/look up THRR;Short Term: Able to use daily as guideline for intensity in rehab;Long Term: Able to use THRR to govern intensity when exercising independently       Able to check pulse independently  Yes       Intervention  Provide education and demonstration on how to check pulse in carotid and radial arteries.;Review the importance of being able to check your own pulse for safety during independent exercise       Expected Outcomes  Short Term: Able to explain why pulse checking is important during independent exercise;Long Term: Able to check pulse independently and accurately       Understanding of Exercise  Prescription  Yes       Intervention  Provide education, explanation, and written materials on patient's individual exercise prescription       Expected Outcomes  Short Term: Able to explain program exercise prescription;Long Term: Able to explain home exercise prescription to exercise independently          Exercise Goals Re-Evaluation :   Discharge Exercise Prescription (Final Exercise Prescription Changes):   Nutrition:  Target Goals: Understanding of nutrition guidelines, daily intake of sodium <1568m, cholesterol <2069m calories 30% from fat and 7% or less from saturated fats, daily to have 5 or more servings of fruits and vegetables.  Biometrics: Pre Biometrics - 11/07/17 1518      Pre Biometrics   Height  5' 5.5" (1.664 m)    Weight  150 lb 11.2 oz (68.4 kg)    Waist Circumference  37.5 inches    Hip Circumference  38 inches    Waist to Hip Ratio  0.99 %    BMI (Calculated)  24.69    Single Leg Stand  27.7 seconds        Nutrition Therapy Plan and Nutrition Goals: Nutrition Therapy & Goals - 11/07/17 1506      Personal Nutrition Goals   Nutrition Goal  Make sure he eats enough protien in his diet. Maintain weight.    Comments  Patient states he does not have a good appitite.      Intervention Plan   Intervention  Prescribe, educate and counsel regarding individualized specific dietary modifications aiming towards targeted core components such as weight, hypertension, lipid management, diabetes, heart failure and other comorbidities.    Expected Outcomes  Short Term Goal: Understand basic principles of dietary content, such as calories, fat, sodium, cholesterol and nutrients.;Long Term Goal: Adherence to prescribed nutrition plan.       Nutrition Assessments: Nutrition Assessments - 11/07/17 1534      MEDFICTS Scores   Pre Score  26       Nutrition Goals Re-Evaluation:   Nutrition Goals Discharge (Final Nutrition Goals  Re-Evaluation):   Psychosocial: Target Goals: Acknowledge presence or absence of significant depression and/or stress, maximize coping skills, provide positive support system. Participant is able to verbalize types and ability to use techniques and skills needed for reducing stress and depression.   Initial Review & Psychosocial Screening: Initial Psych Review & Screening - 11/07/17 1457      Initial Review   Current issues with  Current Depression;Current Stress Concerns    Source of Stress Concerns  Chronic Illness;Unable to perform yard/household activities      [place] Yes    Comments  He can look to his wife and children for support.      Barriers   Psychosocial barriers to participate in program  The patient should benefit from training in stress management and relaxation.      Screening Interventions   Interventions  Program counselor consult;Provide feedback about the scores to participant;To provide support and resources with identified psychosocial needs;Encouraged to exercise    Expected Outcomes  Short Term goal: Utilizing psychosocial counselor, staff and physician to assist with identification of specific Stressors or current issues interfering with healing process. Setting desired goal for each stressor or current issue identified.;Long Term Goal: Stressors or current issues are controlled or eliminated.;Short Term goal: Identification and review with participant of any Quality of Life or Depression concerns found by scoring the questionnaire.;Long Term goal: The participant improves quality of Life and PHQ9 Scores as seen by post scores and/or verbalization of changes       Quality of Life Scores:  Scores of 19 and below usually indicate a poorer quality of life in these areas.  A difference of  2-3 points is a clinically meaningful difference.  A difference of 2-3 points in the total score of the Quality of Life Index has been associated  with significant improvement in overall quality of life, self-image, physical symptoms, and general health in studies assessing change in quality of life.  PHQ-9: Recent Review Flowsheet Data    Depression screen [facility] 2/9 11/07/2017 10/13/2017 06/27/2017 01/26/2017 01/24/2017   Decreased Interest 0 0 0 1 1   Down, Depressed, Hopeless 0 0 0 1 1   PHQ - 2 Score 0 0 0 2 2   Altered sleeping 2 - - 0 3   Tired, decreased energy 2 - - 1 1   Change in appetite 1 - - 2 3   Feeling bad or failure about yourself  0 - - 0 3   Trouble concentrating 0 - - 0 0   Moving slowly or fidgety/restless 0 - - 0 0   Suicidal thoughts 0 - - 0 0   PHQ-9 Score 5 - - 5 12   Difficult doing work/chores Somewhat difficult - - Somewhat difficult Somewhat difficult     Interpretation of Total Score  Total Score Depression Severity:  1-4 = Minimal depression, 5-9 = Mild depression, 10-14 = Moderate depression, 15-19 = Moderately severe depression, 20-27 = Severe depression   Psychosocial Evaluation and Intervention:   Psychosocial Re-Evaluation:   Psychosocial Discharge (Final Psychosocial Re-Evaluation):   Education: Education Goals: Education classes will be provided on a weekly basis, covering required topics. Participant will state understanding/return demonstration of topics presented.  Learning Barriers/Preferences: Learning Barriers/Preferences - 11/07/17 1457      Learning Barriers/Preferences   Learning Barriers  Hearing   sometines he wears a hearing aid   Learning Preferences  Audio;Written Material       Education Topics:  Initial Evaluation Education: - Verbal, written and demonstration of respiratory meds, oximetry and breathing techniques. Instruction on use of nebulizers and MDIs and importance of monitoring MDI activations.   Pulmonary Rehab from 11/07/2017 in [facility] Cardiac and Pulmonary Rehab  Date  11/07/17  Educator  [facility]  Instruction Review Code  1- Verbalizes Understanding       General Nutrition Guidelines/Fats and Fiber: -Group instruction provided by verbal, written material, models and posters to present the general guidelines for heart healthy nutrition. Gives an explanation and review of dietary fats and fiber.   Cardiac Rehab from 05/04/2017 in [facility] Cardiac and Pulmonary Rehab  Date  04/18/17  Educator  CR  Instruction Review Code  1- Verbalizes Understanding      Controlling Sodium/Reading Food Labels: -Group verbal and written material supporting the discussion of sodium use in heart healthy nutrition. Review and explanation with models, verbal and written materials for utilization of the food label.   Cardiac Rehab from 05/04/2017 in [facility] Cardiac and  Pulmonary Rehab  Date  04/25/17  Educator  PI  Instruction Review Code  1- Verbalizes Understanding      Exercise Physiology & General Exercise Guidelines: - Group verbal and written instruction with models to review the exercise physiology of the cardiovascular system and associated critical values. Provides general exercise guidelines with specific guidelines to those with heart or lung disease.    Cardiac Rehab from 05/04/2017 in [facility] Cardiac and Pulmonary Rehab  Date  03/16/17  Educator  [facility]  Instruction Review Code  1- Verbalizes Understanding      Aerobic Exercise & Resistance Training: - Gives group verbal and written instruction on the various components of exercise. Focuses on aerobic and resistive training programs and the benefits of this training and how to safely progress through these programs.   Cardiac Rehab from 05/04/2017 in [facility] Cardiac and Pulmonary Rehab  Date  03/23/17  Educator  [facility]  Instruction Review Code  1- Verbalizes Understanding      Flexibility, Balance, Mind/Body Relaxation: Provides group verbal/written instruction on the benefits of flexibility and balance training, including mind/body exercise modes such as yoga, pilates and tai chi.  Demonstration and skill  practice provided.   Pulmonary Rehab from 07/28/2015 in [facility] Cardiac and Pulmonary Rehab  Date  05/07/15  Educator  RM  Instruction Review Code (retired)  2- meets goals/outcomes      Stress and Anxiety: - Provides group verbal and written instruction about the health risks of elevated stress and causes of high stress.  Discuss the correlation between heart/lung disease and anxiety and treatment options. Review healthy ways to manage with stress and anxiety.   Cardiac Rehab from 05/04/2017 in [facility] Cardiac and Pulmonary Rehab  Date  04/13/17  Educator  [facility]  Instruction Review Code  1- Verbalizes Understanding      Depression: - Provides group verbal and written instruction on the correlation between heart/lung disease and depressed mood, treatment options, and the stigmas associated with seeking treatment.   Exercise & Equipment Safety: - Individual verbal instruction and demonstration of equipment use and safety with use of the equipment.   Pulmonary Rehab from 11/07/2017 in [facility] Cardiac and Pulmonary Rehab  Date  11/07/17  Educator  [facility]  Instruction Review Code  1- Verbalizes Understanding      Infection Prevention: - Provides verbal and written material to individual with discussion of infection control including proper hand washing and proper equipment cleaning during exercise session.   Pulmonary Rehab from 11/07/2017 in [facility] Cardiac and Pulmonary Rehab  Date  11/07/17  Educator  [facility]  Instruction Review Code  1- Verbalizes Understanding      Falls Prevention: - Provides verbal and written material to individual with discussion of falls prevention and safety.   Pulmonary Rehab from 11/07/2017 in [facility] Cardiac and Pulmonary Rehab  Date  11/07/17  Educator  [facility]  Instruction Review Code  1- Verbalizes Understanding      Diabetes: - Individual verbal and written instruction to review signs/symptoms of diabetes, desired ranges of glucose level fasting, after meals and with  exercise. Advice that pre and post exercise glucose checks will be done for 3 sessions at entry of program.   Chronic Lung Diseases: - Group verbal and written instruction to review updates, respiratory medications, advancements in procedures and treatments. Discuss use of supplemental oxygen including available portable oxygen systems, continuous and intermittent flow rates, concentrators, personal use and safety guidelines. Review proper use of inhaler and spacers. Provide informative websites for self-education.  Pulmonary Rehab from 07/28/2015 in [facility] Cardiac and Pulmonary Rehab  Date  07/28/15  Educator  LB  Instruction Review Code (retired)  2- meets Chief Financial Officer: - Provide group verbal and written instruction for methods to conserve energy, plan and organize activities. Instruct on pacing techniques, use of adaptive equipment and posture/positioning to relieve shortness of breath.   Triggers and Exacerbations: - Group verbal and written instruction to review types of environmental triggers and ways to prevent exacerbations. Discuss weather changes, air quality and the benefits of nasal washing. Review warning signs and symptoms to help prevent infections. Discuss techniques for effective airway clearance, coughing, and vibrations.   AED/CPR: - Group verbal and written instruction with the use of models to demonstrate the basic use of the AED with the basic ABC's of resuscitation.   Pulmonary Rehab from 07/28/2015 in [facility] Cardiac and Pulmonary Rehab  Date  05/23/15  Educator  CE  Instruction Review Code (retired)  2- Lawyer and Physiology of the Lungs: - Group verbal and written instruction with the use of models to provide basic lung anatomy and physiology related to function, structure and complications of lung disease.   Anatomy & Physiology of the Heart: - Group verbal and written instruction and models provide basic  cardiac anatomy and physiology, with the coronary electrical and arterial systems. Review of Valvular disease and Heart Failure   Cardiac Rehab from 05/04/2017 in [facility] Cardiac and Pulmonary Rehab  Date  01/31/17  Educator  CE  Instruction Review Code  1- Verbalizes Understanding      Cardiac Medications: - Group verbal and written instruction to review commonly prescribed medications for heart disease. Reviews the medication, class of the drug, and side effects.   Cardiac Rehab from 05/04/2017 in [facility] [facility]  Date  02/14/17  Educator  CE  Instruction Review Code  1- Verbalizes Understanding      Know Your Numbers and Risk Factors: -Group verbal and written instruction about important numbers in your health.  Discussion of what are risk factors and how they play a role in the disease process.  Review of Cholesterol, Blood Pressure, Diabetes, and BMI and the role they play in your overall health.   Pulmonary Rehab from 01/10/2017 in [facility] and Pul[facility] Re[facility]  Date  12/31/16  Educator  [facility]  Instruction Review Code  1- Verbalizes Understanding      Sleep Hygiene: -Provides group verbal and written instruction about how sleep can affect your health.  Define sleep hygiene, discuss sleep cycles and impact of sleep habits. Review good sleep hygiene tips.    Cardiac Rehab from 05/04/2017 in [facility] Cardiac and Pulmonary Rehab  Date  04/27/17  Educator  [facility]  Instruction Review Code  1- Verbalizes Understanding      Other: -Provides group and verbal instruction on various topics (see comments)    Knowledge Questionnaire Score: Knowledge Questionnaire Score - 11/07/17 1506      Knowledge Questionnaire Score   Pre Score  14/18   reviewed with patient       Core Components/Risk Factors/Patient Goals at Admission: Personal Goals and Risk Factors at Admission - 11/07/17 1509      Core Components/Risk Factors/Patient Goals on Admission    Weight Management   Yes;Weight Maintenance    Intervention  Weight Management: Develop a combined nutrition and exercise program designed to reach desired caloric intake, while maintaining appropriate intake of  nutrient and fiber, sodium and fats, and appropriate energy expenditure required for the weight goal.;Weight Management: Provide education and appropriate resources to help participant work on and attain dietary goals.;Weight Management/Obesity: Establish reasonable short term and long term weight goals.    Expected Outcomes  Weight Maintenance: Understanding of the daily nutrition guidelines, which includes 25-35% calories from fat, 7% or less cal from saturated fats, less than 22m cholesterol, less than 1.5gm of sodium, & 5 or more servings of fruits and vegetables daily;Long Term: Adherence to nutrition and physical activity/exercise program aimed toward attainment of established weight goal;Short Term: Continue to assess and modify interventions until short term weight is achieved;Understanding recommendations for meals to include 15-35% energy as protein, 25-35% energy from fat, 35-60% energy from carbohydrates, less than 2086mof dietary cholesterol, 20-35 gm of total fiber daily;Understanding of distribution of calorie intake throughout the day with the consumption of 4-5 meals/snacks    Improve shortness of breath with ADL's  Yes    Intervention  Provide education, individualized exercise plan and daily activity instruction to help decrease symptoms of SOB with activities of daily living.    Expected Outcomes  Short Term: Improve cardiorespiratory fitness to achieve a reduction of symptoms when performing ADLs;Long Term: Be able to perform more ADLs without symptoms or delay the onset of symptoms    Hypertension  Yes    Intervention  Provide education on lifestyle modifcations including regular physical activity/exercise, weight management, moderate sodium restriction and increased consumption of fresh fruit,  vegetables, and low fat dairy, alcohol moderation, and smoking cessation.;Monitor prescription use compliance.    Expected Outcomes  Short Term: Continued assessment and intervention until BP is < 140/9025mG in hypertensive participants. < 130/78m76m in hypertensive participants with diabetes, heart failure or chronic kidney disease.;Long Term: Maintenance of blood pressure at goal levels.    Lipids  Yes    Intervention  Provide education and support for participant on nutrition & aerobic/resistive exercise along with prescribed medications to achieve LDL <70mg41mL >40mg.58mExpected Outcomes  Short Term: Participant states understanding of desired cholesterol values and is compliant with medications prescribed. Participant is following exercise prescription and nutrition guidelines.;Long Term: Cholesterol controlled with medications as prescribed, with individualized exercise RX and with personalized nutrition plan. Value goals: LDL < 70mg, 38m> 40 mg.       Core Components/Risk Factors/Patient Goals Review:    Core Components/Risk Factors/Patient Goals at Discharge (Final Review):    ITP Comments: ITP Comments    Row Name 11/07/17 1445           ITP Comments  Medical Evaluation completed. Chart sent for review and changes to Dr. Mark MiEmily Filbertor [facility]          Comments: Initial ITP

## 2017-11-09 ENCOUNTER — Encounter: Payer: Self-pay | Admitting: Dietician

## 2017-11-09 DIAGNOSIS — J8410 Pulmonary fibrosis, unspecified: Secondary | ICD-10-CM

## 2017-11-09 DIAGNOSIS — I2721 Secondary pulmonary arterial hypertension: Secondary | ICD-10-CM | POA: Diagnosis not present

## 2017-11-09 NOTE — Progress Notes (Signed)
Daily Session Note  Patient Details  Name: Vincent Kennedy MRN: [id number] Date of Birth: 09-May-1932 Referring Provider:     Pulmonary Rehab from 11/07/2017 in [facility] Re[facility]  Referring Provider  Dorthula Perfect Date: 11/09/2017  Check In: Session Check In - 11/09/17 1038      Check-In   Supervising physician immediately available to respond to emergencies  [name] immediately available ER MD    Location  [facility]-Cardiac & Pulmonary Rehab    Staff Present  Alberteen Sam, MA, RCEP, CCRP, Exercise Physiologist;Joseph Foy [place], [place], ACSM CEP, Exercise Physiologist    Medication changes reported      No    Fall or balance concerns reported     No    Warm-up and Cool-down  Performed as group-led instruction    Resistance Training Performed  Yes    VAD Patient?  No    PAD/SET Patient?  No      Pain Assessment   Currently in Pain?  No/denies    Multiple Pain Sites  No          Social History   Tobacco Use  Smoking Status Never Smoker  Smokeless Tobacco Never Used    Goals Met:  Proper associated with RPD/PD & O2 Sat Independence with exercise equipment Exercise tolerated well Strength training completed today  Goals Unmet:  Not Applicable  Comments: First full day of exercise!  Patient was oriented to gym and equipment including functions, settings, policies, and procedures.  Patient's individual exercise prescription and treatment plan were reviewed.  All starting workloads were established based on the results of the 6 minute walk test done at initial orientation visit.  The plan for exercise progression was also introduced and progression will be customized based on patient's performance and goals.    Dr. Emily Filbert is Medical Director for [place] and [name].

## 2017-11-11 DIAGNOSIS — I2721 Secondary pulmonary arterial hypertension: Secondary | ICD-10-CM

## 2017-11-11 NOTE — Progress Notes (Signed)
Daily Session Note  Patient Details  Name: Vincent Kennedy MRN: [id number] Date of Birth: 05-09-1932 Referring Provider:     Pulmonary Rehab from 11/07/2017 in [facility] Cardiac and Pulmonary Rehab  Referring Provider  Dorthula Perfect Date: 11/11/2017  Check In:      Social History   Tobacco Use  Smoking Status Never Smoker  Smokeless Tobacco Never Used    Goals Met:  Independence with exercise equipment Exercise tolerated well No report of cardiac concerns or symptoms Strength training completed today  Goals Unmet:  Not Applicable  Comments: Pt able to follow exercise prescription today without complaint.  Will continue to monitor for progression.   Dr. Emily Filbert is Medical Director for [employer] and [name].

## 2017-11-14 ENCOUNTER — Encounter: Payer: Medicare Other | Admitting: *Deleted

## 2017-11-14 DIAGNOSIS — I2721 Secondary pulmonary arterial hypertension: Secondary | ICD-10-CM

## 2017-11-14 DIAGNOSIS — J8410 Pulmonary fibrosis, unspecified: Secondary | ICD-10-CM

## 2017-11-14 NOTE — Progress Notes (Signed)
Pulmonary Individual Treatment Plan  Patient Details  Name: Vincent Kennedy MRN: [id number] Date of Birth: 1932/05/10 Referring Provider:     Pulmonary Rehab from 11/07/2017 in [facility] Re[facility]  Referring Provider  [name]      Initial Encounter Date:    Pulmonary Rehab from 11/07/2017 in [facility] Card[facility] and Pulmonary Rehab  Date  11/07/17      Visit Diagnosis: Pulmonary artery hypertension ([place])  Patient's Home Medications on Admission:  Current Outpatient Medications:  .  aspirin EC 81 MG tablet, Take 1 tablet (81 mg total) by mouth daily., Disp: 30 tablet, Rfl: 6 .  budesonide (PULMICORT) 0.5 MG/2ML nebulizer solution, Take 2 mLs (0.5 mg total) by nebulization 2 (two) times daily., Disp: 260 mL, Rfl: 3 .  clopidogrel (PLAVIX) 75 MG tablet, Take 1 tablet (75 mg total) by mouth daily., Disp: 90 tablet, Rfl: 3 .  finasteride (PROSCAR) 5 MG tablet, Take 1 tablet (5 mg total) by mouth daily., Disp: 90 tablet, Rfl: 3 .  metoprolol succinate (TOPROL-XL) 25 MG 24 hr tablet, Take 25 mg by mouth 3 (three) times daily., Disp: , Rfl:  .  metoprolol tartrate (LOPRESSOR) 25 MG tablet, Take 1 tablet (25 mg total) by mouth 2 (two) times daily., Disp: 180 tablet, Rfl: 3 .  mometasone-formoterol (DULERA) 100-5 MCG/ACT AERO, Inhale 2 puffs into the lungs 2 (two) times daily., Disp: 1 Inhaler, Rfl: 1 .  nitroGLYCERIN (NITROSTAT) 0.4 MG SL tablet, Place 1 tablet (0.4 mg total) under the tongue every 5 (five) minutes as needed for chest pain., Disp: 25 tablet, Rfl: 12 .  pantoprazole (PROTONIX) 40 MG tablet, Take 1 tablet (40 mg total) by mouth daily. (Patient taking differently: Take 40 mg by mouth daily as needed (heartburn). ), Disp: 30 tablet, Rfl: 6 .  Pirfenidone (ESBRIET) 267 MG CAPS, Take 801 mg by mouth 3 (three) times daily. , Disp: , Rfl:  .  rivastigmine (EXELON) 9.5 mg/24hr, Place 1 patch (9.5 mg total) onto the skin daily., Disp: 90 patch, Rfl: 2 .  rosuvastatin (CRESTOR) 40 MG  tablet, Take 40 mg by mouth every day, Disp: 90 tablet, Rfl: 3 .  zolpidem (AMBIEN) 10 MG tablet, TAKE 1 TABLET BY MOUTH AT  BEDTIME AS NEEDED, Disp: 90 tablet, Rfl: 2  Past Medical History: Past Medical History:  Diagnosis Date  . Abnormal nuclear cardiac imaging test    Dr. Fletcher Anon  . BPH (benign prostatic hyperplasia)   . Coronary artery disease    Cardiac cath September 2008: 20% left main stenosis, 40% mid LAD stenosis, 99% ostial first diagonal stenosis in a large branch and 90% ostial disease in second diagonal but the vessel was very small, 20% mid RCA stenosis with normal ejection fraction. Successful angioplasty and drug-eluting stent placement to the ostial first diagonal with a 2.5 x 15 mm Xience stent  . GERD (gastroesophageal reflux disease)   . Hyperlipidemia   . Hypertension    BP has never been high(per pt).  Metoprolol is to keep BP low because of stent (afterload)  . On home oxygen therapy    "2-3L; 24/7" (01/19/2017)  . OSA on CPAP   . Oxygen deficiency   . Pneumonia 07/2016  . Pulmonary fibrosis ([place]) 08/26/2015  . Shortness of breath dyspnea    with exertion  . Wears hearing aid    (sometimes)    Tobacco Use: Social History   Tobacco Use  Smoking Status Never Smoker  Smokeless Tobacco Never Used  Labs: Recent Review Flowsheet Data    Labs for ITP Cardiac and Pulmonary Rehab Latest Ref Rng & Units 01/12/2017 01/12/2017 01/12/2017 01/12/2017 03/11/2017   Cholestrol 100 - 199 mg/dL - - - - 131   LDLCALC 0 - 99 mg/dL - - - - 73   HDL >39 mg/dL - - - - 38(L)   Trlycerides 0 - 149 mg/dL - - - - 99   PHART 7.350 - 7.450 7.378 - - - -   PCO2ART 32.0 - 48.0 mmHg 41.3 - - - -   HCO3 20.0 - 28.0 mmol/L 24.3 23.8 24.1 25.3 -   TCO2 22 - 32 mmol/L _0 -   ACIDBASEDEF 0.0 - 2.0 mmol/L 1.0 2.0 1.0 1.0 -   O2SAT % 98.0 77.0 76.0 64.0 -       Pulmonary Assessment Scores: Pulmonary Assessment Scores    Row Name 11/07/17 1448         ADL UCSD   ADL  Phase  Entry     SOB Score total  25     Rest  0     Walk  1     Stairs  2     Bath  1     Dress  1     Shop  2       CAT Score   CAT Score  11       mMRC Score   mMRC Score  2        Pulmonary Function Assessment: Pulmonary Function Assessment - 11/07/17 1509      Breath   Bilateral Breath Sounds  Clear;Decreased    Shortness of Breath  Yes;Fear of Shortness of Breath;Limiting activity       Exercise Target Goals: Exercise Program Goal: Individual exercise prescription set using results from initial 6 min walk test and THRR while considering  patient's activity barriers and safety.   Exercise Prescription Goal: Initial exercise prescription builds to 30-45 minutes a day of aerobic activity, 2-3 days per week.  Home exercise guidelines will be given to patient during program as part of exercise prescription that the participant will acknowledge.  Activity Barriers & Risk Stratification:   6 Minute Walk: 6 Minute Walk    Row Name 11/07/17 1520         6 Minute Walk   Distance  542 feet     Walk Time  3.6 minutes     # of Rest Breaks  1     MPH  1.7     METS  2.3     RPE  13     Perceived Dyspnea   1     VO2 Peak  3.46     Symptoms  Yes (comment)     Comments  O2 dropped during test     Resting HR  64 bpm     Resting BP  104/58     Resting Oxygen Saturation   95 %     Exercise Oxygen Saturation  during 6 min walk  78 %     Max Ex. HR  97 bpm     Max Ex. BP  134/66     2 Minute Post BP  122/64       Interval HR   1 Minute HR  97     2 Minute HR  97     4 Minute HR  83     5 Minute HR  71  6 Minute HR  95     2 Minute Post HR  68     Interval Heart Rate?  Yes       Interval Oxygen   Interval Oxygen?  Yes     Baseline Oxygen Saturation %  95 %     1 Minute Oxygen Saturation %  89 %     1 Minute Liters of Oxygen  4 L pulsed     2 Minute Oxygen Saturation %  79 %     2 Minute Liters of Oxygen  4 L     3 Minute Liters of Oxygen  4 L     4  Minute Oxygen Saturation %  78 %     4 Minute Liters of Oxygen  4 L     5 Minute Oxygen Saturation %  82 %     5 Minute Liters of Oxygen  4 L     6 Minute Oxygen Saturation %  88 %     6 Minute Liters of Oxygen  4 L     2 Minute Post Oxygen Saturation %  93 %     2 Minute Post Liters of Oxygen  4 L       Oxygen Initial Assessment: Oxygen Initial Assessment - 11/07/17 1507      Home Oxygen   Home Oxygen Device  Portable Concentrator;Home Concentrator;E-Tanks    Sleep Oxygen Prescription  CPAP    Liters per minute  3    Home Exercise Oxygen Prescription  Continuous    Liters per minute  4    Home at Rest Exercise Oxygen Prescription  Continuous    Liters per minute  3    Compliance with Home Oxygen Use  Yes      Initial 6 min Walk   Oxygen Used  Portable Concentrator;Pulsed    Liters per minute  5      Program Oxygen Prescription   Program Oxygen Prescription  Continuous    Liters per minute  5      Intervention   Short Term Goals  To learn and demonstrate proper use of respiratory medications;To learn and understand importance of maintaining oxygen saturations>88%;To learn and exhibit compliance with exercise, home and travel O2 prescription;To learn and understand importance of monitoring SPO2 with pulse oximeter and demonstrate accurate use of the pulse oximeter.;To learn and demonstrate proper pursed lip breathing techniques or other breathing techniques.    Long  Term Goals  Compliance with respiratory medication;Demonstrates proper use of MDI's;Exhibits proper breathing techniques, such as pursed lip breathing or other method taught during program session;Maintenance of O2 saturations>88%;Verbalizes importance of monitoring SPO2 with pulse oximeter and return demonstration;Exhibits compliance with exercise, home and travel O2 prescription       Oxygen Re-Evaluation:   Oxygen Discharge (Final Oxygen Re-Evaluation):   Initial Exercise Prescription: Initial Exercise  Prescription - 11/07/17 1500      Date of Initial Exercise RX and Referring Provider   Date  11/07/17    Referring Provider  [name]      Treadmill   MPH  1.5    Grade  0    Minutes  15    METs  2.15      NuStep   Level  2    SPM  80    Minutes  15    METs  2      Arm Ergometer   Level  1    RPM  40  Minutes  15    METs  2      Prescription Details   Frequency (times per week)  3    Duration  Progress to 45 minutes of aerobic exercise without signs/symptoms of physical distress      Intensity   THRR 40-80% of Max Heartrate  93-122    Ratings of Perceived Exertion  11-13    Perceived Dyspnea  0-4      Resistance Training   Training Prescription  Yes    Weight  2 lb    Reps  10-15       Perform Capillary Blood Glucose checks as needed.  Exercise Prescription Changes:   Exercise Comments: Exercise Comments    Row Name 11/09/17 1040           Exercise Comments   First full day of exercise!  Patient was oriented to gym and equipment including functions, settings, policies, and procedures.  Patient's individual exercise prescription and treatment plan were reviewed.  All starting workloads were established based on the results of the 6 minute walk test done at initial orientation visit.  The plan for exercise progression was also introduced and progression will be customized based on patient's performance and goals.          Exercise Goals and Review: Exercise Goals    Row Name 11/07/17 1520             Exercise Goals   Increase Physical Activity  Yes       Intervention  Provide advice, education, support and counseling about physical activity/exercise needs.;Develop an individualized exercise prescription for aerobic and resistive training based on initial evaluation findings, risk stratification, comorbidities and participant's personal goals.       Expected Outcomes  Short Term: Attend rehab on a regular basis to increase amount of physical  activity.;Long Term: Add in home exercise to make exercise part of routine and to increase amount of physical activity.;Long Term: Exercising regularly at least 3-5 days a week.       Increase Strength and Stamina  Yes       Intervention  Provide advice, education, support and counseling about physical activity/exercise needs.;Develop an individualized exercise prescription for aerobic and resistive training based on initial evaluation findings, risk stratification, comorbidities and participant's personal goals.       Expected Outcomes  Short Term: Increase workloads from initial exercise prescription for resistance, speed, and METs.;Short Term: Perform resistance training exercises routinely during rehab and add in resistance training at home;Long Term: Improve cardiorespiratory fitness, muscular endurance and strength as measured by increased METs and functional capacity (6MWT)       Able to understand and use rate of perceived exertion (RPE) scale  Yes       Intervention  Provide education and explanation on how to use RPE scale       Expected Outcomes  Short Term: Able to use RPE daily in rehab to express subjective intensity level;Long Term:  Able to use RPE to guide intensity level when exercising independently       Able to understand and use Dyspnea scale  Yes       Intervention  Provide education and explanation on how to use Dyspnea scale       Expected Outcomes  Short Term: Able to use Dyspnea scale daily in rehab to express subjective sense of shortness of breath during exertion;Long Term: Able to use Dyspnea scale to guide intensity level when exercising independently         Knowledge and understanding of Target Heart Rate Range (THRR)  Yes       Intervention  Provide education and explanation of THRR including how the numbers were predicted and where they are located for reference       Expected Outcomes  Short Term: Able to state/look up THRR;Short Term: Able to use daily as guideline for  intensity in rehab;Long Term: Able to use THRR to govern intensity when exercising independently       Able to check pulse independently  Yes       Intervention  Provide education and demonstration on how to check pulse in carotid and radial arteries.;Review the importance of being able to check your own pulse for safety during independent exercise       Expected Outcomes  Short Term: Able to explain why pulse checking is important during independent exercise;Long Term: Able to check pulse independently and accurately       Understanding of Exercise Prescription  Yes       Intervention  Provide education, explanation, and written materials on patient's individual exercise prescription       Expected Outcomes  Short Term: Able to explain program exercise prescription;Long Term: Able to explain home exercise prescription to exercise independently          Exercise Goals Re-Evaluation : Exercise Goals Re-Evaluation    Row Name 11/09/17 1040             Exercise Goal Re-Evaluation   Exercise Goals Review  Increase Physical Activity;Able to understand and use rate of perceived exertion (RPE) scale;Knowledge and understanding of Target Heart Rate Range (THRR);Increase Strength and Stamina;Able to understand and use Dyspnea scale       Comments  Reviewed RPE scale, THR and program prescription with pt today.  Pt voiced understanding and was given a copy of goals to take home.        Expected Outcomes  Short: Use RPE daily to regulate intensity. Long: Follow program prescription in THR.          Discharge Exercise Prescription (Final Exercise Prescription Changes):   Nutrition:  Target Goals: Understanding of nutrition guidelines, daily intake of sodium <1536m, cholesterol <2079m calories 30% from fat and 7% or less from saturated fats, daily to have 5 or more servings of fruits and vegetables.  Biometrics: Pre Biometrics - 11/07/17 1518      Pre Biometrics   Height  5' 5.5" (1.664 m)     Weight  150 lb 11.2 oz (68.4 kg)    Waist Circumference  37.5 inches    Hip Circumference  38 inches    Waist to Hip Ratio  0.99 %    BMI (Calculated)  24.69    Single Leg Stand  27.7 seconds        Nutrition Therapy Plan and Nutrition Goals: Nutrition Therapy & Goals - 11/09/17 1130      Nutrition Therapy   Diet  TLC    Protein (specify units)  7-8oz    Fiber  30 grams    Whole Grain Foods  3 servings    Saturated Fats  13 max. grams    Fruits and Vegetables  5 servings/day   8 ideal. eats fruits and vegetables regularly but eats small portions   Sodium  1500 grams      Personal Nutrition Goals   Nutrition Goal  Continue to drink your daily protein shake, great job. Consider increasing to 2/day if you start to have trouble  maintaining CBW    Personal Goal #2  Drink a glass of milk each day, ideally in the afternoon between lunch and dinner, or in the evening    Personal Goal #3  Try to eat snacks consistently between meals, which will help you meet your calorie needs d/t only eaing small portions at meals. Snacks that contain fat are typically more calorie-dense and so are optimal choices    Comments  He has lost a few pounds since his last round of classes in this department. His appetite continues to be poor and he c.o altered taste of foods. He has started to put more focus on consuming protein sources IE more meat and will snack occasionally between meals      Intervention Plan   Intervention  Prescribe, educate and counsel regarding individualized specific dietary modifications aiming towards targeted core components such as weight, hypertension, lipid management, diabetes, heart failure and other comorbidities.    Expected Outcomes  Short Term Goal: Understand basic principles of dietary content, such as calories, fat, sodium, cholesterol and nutrients.;Short Term Goal: A plan has been developed with personal nutrition goals set during dietitian appointment.;Long Term Goal:  Adherence to prescribed nutrition plan.       Nutrition Assessments: Nutrition Assessments - 11/07/17 1534      MEDFICTS Scores   Pre Score  26       Nutrition Goals Re-Evaluation: Nutrition Goals Re-Evaluation    Row Name 11/09/17 1138             Goals   Nutrition Goal  Continue to drink your daily protein shake, great job. Consider increasing to 2/day if you start to have trouble maintaining CBW       Comment  He has started to drink a protein drink daily, which he was not doing in the past, in order to better meet his daily protein requirements       Expected Outcome  He will drink 1-2 protien shakes daily consistently         Personal Goal #2 Re-Evaluation   Personal Goal #2  Drink a glass of milk each day, ideally in the afternoon between lunch and dinner, or in the evening         Personal Goal #3 Re-Evaluation   Personal Goal #3  Try to eat snacks consistently between meals, which will help you meet your calorie needs d/t only eating small portions at meals. Snacks that contain fat are typically more calorie dense and so are optimal choices          Nutrition Goals Discharge (Final Nutrition Goals Re-Evaluation): Nutrition Goals Re-Evaluation - 11/09/17 1138      Goals   Nutrition Goal  Continue to drink your daily protein shake, great job. Consider increasing to 2/day if you start to have trouble maintaining CBW    Comment  He has started to drink a protein drink daily, which he was not doing in the past, in order to better meet his daily protein requirements    Expected Outcome  He will drink 1-2 protien shakes daily consistently      Personal Goal #2 Re-Evaluation   Personal Goal #2  Drink a glass of milk each day, ideally in the afternoon between lunch and dinner, or in the evening      Personal Goal #3 Re-Evaluation   Personal Goal #3  Try to eat snacks consistently between meals, which will help you meet your calorie needs d/t only eating small portions   at  meals. Snacks that contain fat are typically more calorie dense and so are optimal choices       Psychosocial: Target Goals: Acknowledge presence or absence of significant depression and/or stress, maximize coping skills, provide positive support system. Participant is able to verbalize types and ability to use techniques and skills needed for reducing stress and depression.   Initial Review & Psychosocial Screening: Initial Psych Review & Screening - 11/07/17 1457      Initial Review   Current issues with  Current Depression;Current Stress Concerns    Source of Stress Concerns  Chronic Illness;Unable to perform yard/household activities      Family Dynamics   Good Support System?  Yes    Comments  He can look to his wife and children for support.      Barriers   Psychosocial barriers to participate in program  The patient should benefit from training in stress management and relaxation.      Screening Interventions   Interventions  Program counselor consult;Provide feedback about the scores to participant;To provide support and resources with identified psychosocial needs;Encouraged to exercise    Expected Outcomes  Short Term goal: Utilizing psychosocial counselor, staff and physician to assist with identification of specific Stressors or current issues interfering with healing process. Setting desired goal for each stressor or current issue identified.;Long Term Goal: Stressors or current issues are controlled or eliminated.;Short Term goal: Identification and review with participant of any Quality of Life or Depression concerns found by scoring the questionnaire.;Long Term goal: The participant improves quality of Life and PHQ9 Scores as seen by post scores and/or verbalization of changes       Quality of Life Scores:  Scores of 19 and below usually indicate a poorer quality of life in these areas.  A difference of  2-3 points is a clinically meaningful difference.  A difference of  2-3 points in the total score of the Quality of Life Index has been associated with significant improvement in overall quality of life, self-image, physical symptoms, and general health in studies assessing change in quality of life.  PHQ-9: Recent Review Flowsheet Data    Depression screen PHQ 2/9 11/07/2017 10/13/2017 06/27/2017 01/26/2017 01/24/2017   Decreased Interest 0 0 0 1 1   Down, Depressed, Hopeless 0 0 0 1 1   PHQ - 2 Score 0 0 0 2 2   Altered sleeping 2 - - 0 3   Tired, decreased energy 2 - - 1 1   Change in appetite 1 - - 2 3   Feeling bad or failure about yourself  0 - - 0 3   Trouble concentrating 0 - - 0 0   Moving slowly or fidgety/restless 0 - - 0 0   Suicidal thoughts 0 - - 0 0   PHQ-9 Score 5 - - 5 12   Difficult doing work/chores Somewhat difficult - - Somewhat difficult Somewhat difficult     Interpretation of Total Score  Total Score Depression Severity:  1-4 = Minimal depression, 5-9 = Mild depression, 10-14 = Moderate depression, 15-19 = Moderately severe depression, 20-27 = Severe depression   Psychosocial Evaluation and Intervention:   Psychosocial Re-Evaluation:   Psychosocial Discharge (Final Psychosocial Re-Evaluation):   Education: Education Goals: Education classes will be provided on a weekly basis, covering required topics. Participant will state understanding/return demonstration of topics presented.  Learning Barriers/Preferences: Learning Barriers/Preferences - 11/07/17 1457      Learning Barriers/Preferences   Learning Barriers    Hearing   sometines he wears a hearing aid   Learning Preferences  Audio;Written Material       Education Topics:  Initial Evaluation Education: - Verbal, written and demonstration of respiratory meds, oximetry and breathing techniques. Instruction on use of nebulizers and MDIs and importance of monitoring MDI activations.   Pulmonary Rehab from 11/09/2017 in [facility] Cardiac and Pulmonary Rehab  Date  11/07/17   Educator  JH  Instruction Review Code  1- Verbalizes Understanding      General Nutrition Guidelines/Fats and Fiber: -Group instruction provided by verbal, written material, models and posters to present the general guidelines for heart healthy nutrition. Gives an explanation and review of dietary fats and fiber.   Cardiac Rehab from 05/04/2017 in [facility]iac and Pulmonary Rehab  Date  04/18/17  Educator  CR  Instruction Review Code  1- Verbalizes Understanding      Controlling Sodium/Reading Food Labels: -Group verbal and written material supporting the discussion of sodium use in heart healthy nutrition. Review and explanation with models, verbal and written materials for utilization of the food label.   Cardiac Rehab from 05/04/2017 in [facility] Cardiac and Pulmonary Rehab  Date  04/25/17  Educator  PI  Instruction Review Code  1- Verbalizes Understanding      Exercise Physiology & General Exercise Guidelines: - Group verbal and written instruction with models to review the exercise physiology of the cardiovascular system and associated critical values. Provides general exercise guidelines with specific guidelines to those with heart or lung disease.    Cardiac Rehab from 05/04/2017 in [facility] and Pulmonary Rehab  Date  03/16/17  Educator  JH  Instruction Review Code  1- Verbalizes Understanding      Aerobic Exercise & Resistance Training: - Gives group verbal and written instruction on the various components of exercise. Focuses on aerobic and resistive training programs and the benefits of this training and how to safely progress through these programs.   Cardiac Rehab from 05/04/2017 in [facility] and Pulmonary Rehab  Date  03/23/17  Educator  JH  Instruction Review Code  1- Verbalizes Understanding      Flexibility, Balance, Mind/Body Relaxation: Provides group verbal/written instruction on the benefits of flexibility and balance training, including mind/body  exercise modes such as yoga, pilates and tai chi.  Demonstration and skill practice provided.   Pulmonary Rehab from 07/28/2015 in [facility]iac and Pulmonary Rehab  Date  05/07/15  Educator  RM  Instruction Review Code (retired)  2- meets goals/outcomes      Stress and Anxiety: - Provides group verbal and written instruction about the health risks of elevated stress and causes of high stress.  Discuss the correlation between heart/lung disease and anxiety and treatment options. Review healthy ways to manage with stress and anxiety.   Cardiac Rehab from 05/04/2017 in [facility] and Pulmonary Rehab  Date  04/13/17  Educator  KC  Instruction Review Code  1- Verbalizes Understanding      Depression: - Provides group verbal and written instruction on the correlation between heart/lung disease and depressed mood, treatment options, and the stigmas associated with seeking treatment.   Exercise & Equipment Safety: - Individual verbal instruction and demonstration of equipment use and safety with use of the equipment.   Pulmonary Rehab from 11/09/2017 in [facility]  Date  11/07/17  Educator  JH  Instruction Review Code  1- Verbalizes Understanding      Infection Prevention: - Provides verbal and written   material to individual with discussion of infection control including proper hand washing and proper equipment cleaning during exercise session.   Pulmonary Rehab from 11/09/2017 in [facility]  Date  11/07/17  Educator  JH  Instruction Review Code  1- Verbalizes Understanding      Falls Prevention: - Provides verbal and written material to individual with discussion of falls prevention and safety.   Pulmonary Rehab from 11/09/2017 in [facility]  Date  11/07/17  Educator  JH  Instruction Review Code  1- Verbalizes Understanding      Diabetes: - Individual verbal and written instruction to review signs/symptoms of  diabetes, desired ranges of glucose level fasting, after meals and with exercise. Advice that pre and post exercise glucose checks will be done for 3 sessions at entry of program.   Chronic Lung Diseases: - Group verbal and written instruction to review updates, respiratory medications, advancements in procedures and treatments. Discuss use of supplemental oxygen including available portable oxygen systems, continuous and intermittent flow rates, concentrators, personal use and safety guidelines. Review proper use of inhaler and spacers. Provide informative websites for self-education.    Pulmonary Rehab from 07/28/2015 in [facility]  Date  07/28/15  Educator  LB  Instruction Review Code (retired)  2- meets goals/outcomes      Energy Conservation: - Provide group verbal and written instruction for methods to conserve energy, plan and organize activities. Instruct on pacing techniques, use of adaptive equipment and posture/positioning to relieve shortness of breath.   Pulmonary Rehab from 11/09/2017 in [facility]  Date  11/09/17  Educator  JH  Instruction Review Code  1- Verbalizes Understanding      Triggers and Exacerbations: - Group verbal and written instruction to review types of environmental triggers and ways to prevent exacerbations. Discuss weather changes, air quality and the benefits of nasal washing. Review warning signs and symptoms to help prevent infections. Discuss techniques for effective airway clearance, coughing, and vibrations.   AED/CPR: - Group verbal and written instruction with the use of models to demonstrate the basic use of the AED with the basic ABC's of resuscitation.   Pulmonary Rehab from 07/28/2015 in [facility]  Date  05/23/15  Educator  CE  Instruction Review Code (retired)  2- meets goals/outcomes      Anatomy and Physiology of the Lungs: - Group verbal and written instruction with the  use of models to provide basic lung anatomy and physiology related to function, structure and complications of lung disease.   Anatomy & Physiology of the Heart: - Group verbal and written instruction and models provide basic cardiac anatomy and physiology, with the coronary electrical and arterial systems. Review of Valvular disease and Heart Failure   Cardiac Rehab from 05/04/2017 in [facility]  Date  01/31/17  Educator  CE  Instruction Review Code  1- Verbalizes Understanding      Cardiac Medications: - Group verbal and written instruction to review commonly prescribed medications for heart disease. Reviews the medication, class of the drug, and side effects.   Cardiac Rehab from 05/04/2017 in [facility]  Date  02/14/17  Educator  CE  Instruction Review Code  1- Verbalizes Understanding      Know Your Numbers and Risk Factors: -Group verbal and written instruction about important numbers in your health.  Discussion of what are risk factors and how they play   a role in the disease process.  Review of Cholesterol, Blood Pressure, Diabetes, and BMI and the role they play in your overall health.   Pulmonary Rehab from 01/10/2017 in [facility]  Date  12/31/16  Educator  MC  Instruction Review Code  1- Verbalizes Understanding      Sleep Hygiene: -Provides group verbal and written instruction about how sleep can affect your health.  Define sleep hygiene, discuss sleep cycles and impact of sleep habits. Review good sleep hygiene tips.    Cardiac Rehab from 05/04/2017 in [facility]  Date  04/27/17  Educator  KC  Instruction Review Code  1- Verbalizes Understanding      Other: -Provides group and verbal instruction on various topics (see comments)    Knowledge Questionnaire Score: Knowledge Questionnaire Score - 11/07/17 1506      Knowledge Questionnaire Score   Pre Score  14/18   reviewed  with patient       Core Components/Risk Factors/Patient Goals at Admission: Personal Goals and Risk Factors at Admission - 11/07/17 1509      Core Components/Risk Factors/Patient Goals on Admission    Weight Management  Yes;Weight Maintenance    Intervention  Weight Management: Develop a combined nutrition and exercise program designed to reach desired caloric intake, while maintaining appropriate intake of nutrient and fiber, sodium and fats, and appropriate energy expenditure required for the weight goal.;Weight Management: Provide education and appropriate resources to help participant work on and attain dietary goals.;Weight Management/Obesity: Establish reasonable short term and long term weight goals.    Admit Weight  150 lb 11.2 oz (68.4 kg)    Goal Weight: Short Term  150 lb (68 kg)    Goal Weight: Long Term  150 lb (68 kg)    Expected Outcomes  Weight Maintenance: Understanding of the daily nutrition guidelines, which includes 25-35% calories from fat, 7% or less cal from saturated fats, less than 200mg cholesterol, less than 1.5gm of sodium, & 5 or more servings of fruits and vegetables daily;Long Term: Adherence to nutrition and physical activity/exercise program aimed toward attainment of established weight goal;Short Term: Continue to assess and modify interventions until short term weight is achieved;Understanding recommendations for meals to include 15-35% energy as protein, 25-35% energy from fat, 35-60% energy from carbohydrates, less than 200mg of dietary cholesterol, 20-35 gm of total fiber daily;Understanding of distribution of calorie intake throughout the day with the consumption of 4-5 meals/snacks    Improve shortness of breath with ADL's  Yes    Intervention  Provide education, individualized exercise plan and daily activity instruction to help decrease symptoms of SOB with activities of daily living.    Expected Outcomes  Short Term: Improve cardiorespiratory fitness to  achieve a reduction of symptoms when performing ADLs;Long Term: Be able to perform more ADLs without symptoms or delay the onset of symptoms    Hypertension  Yes    Intervention  Provide education on lifestyle modifcations including regular physical activity/exercise, weight management, moderate sodium restriction and increased consumption of fresh fruit, vegetables, and low fat dairy, alcohol moderation, and smoking cessation.;Monitor prescription use compliance.    Expected Outcomes  Short Term: Continued assessment and intervention until BP is < 140/90mm HG in hypertensive participants. < 130/80mm HG in hypertensive participants with diabetes, heart failure or chronic kidney disease.;Long Term: Maintenance of blood pressure at goal levels.    Lipids  Yes    Intervention  Provide education and support for   participant on nutrition & aerobic/resistive exercise along with prescribed medications to achieve LDL <35m, HDL >485m    Expected Outcomes  Short Term: Participant states understanding of desired cholesterol values and is compliant with medications prescribed. Participant is following exercise prescription and nutrition guidelines.;Long Term: Cholesterol controlled with medications as prescribed, with individualized exercise RX and with personalized nutrition plan. Value goals: LDL < 7037mHDL > 40 mg.       Core Components/Risk Factors/Patient Goals Review:    Core Components/Risk Factors/Patient Goals at Discharge (Final Review):    ITP Comments: ITP Comments    Row Name 11/07/17 1445 11/14/17 0808         ITP Comments  Medical Evaluation completed. Chart sent for review and changes to Dr. [place] [facility] encounter 11/01/17  30 day review completed. ITP sent to Dr. [place]ily [place] unless changes are made by physician         Comments: 30 day review

## 2017-11-14 NOTE — Progress Notes (Signed)
Daily Session Note  Patient Details  Name: TREJON DUFORD MRN: [id number] Date of Birth: 1932-10-07 Referring Provider:     Pulmonary Rehab from 11/07/2017 in [place] [facility]  Referring Provider  Dorthula Perfect Date: 11/14/2017  Check In: Session Check In - 11/14/17 1017      Check-In   Supervising physician immediately available to respond to emergencies  [name] immediately available ER MD    Physician(s)  Dr. Clearnce Hasten and Dr. Archie Balboa    Location  [facility]    Staff Present  Earlean Shawl, BS, ACSM CEP, Exercise Physiologist;Amanda Oletta Darter, BA, ACSM CEP, Exercise Physiologist;Joseph Tessie Fass RCP,RRT,BSRT    Medication changes reported      No    Fall or balance concerns reported     No    Tobacco Cessation  No Change    Warm-up and Cool-down  Performed as group-led instruction    Resistance Training Performed  Yes    VAD Patient?  No    PAD/SET Patient?  No      Pain Assessment   Currently in Pain?  No/denies    Multiple Pain Sites  No          Social History   Tobacco Use  Smoking Status Never Smoker  Smokeless Tobacco Never Used    Goals Met:  Proper associated with RPD/PD & O2 Sat Independence with exercise equipment Exercise tolerated well No report of cardiac concerns or symptoms Strength training completed today  Goals Unmet:  Not Applicable  Comments: Pt able to follow exercise prescription today without complaint.  Will continue to monitor for progression.    Dr. Emily Filbert is Medical Director for [place] and [name].

## 2017-11-15 DIAGNOSIS — J9601 Acute respiratory failure with hypoxia: Secondary | ICD-10-CM | POA: Diagnosis not present

## 2017-11-16 DIAGNOSIS — I2721 Secondary pulmonary arterial hypertension: Secondary | ICD-10-CM

## 2017-11-16 NOTE — Progress Notes (Signed)
Daily Session Note  Patient Details  Name: Vincent Kennedy MRN: [id number] Date of Birth: 09-21-1932 Referring Provider:     Pulmonary Rehab from 11/07/2017 in [place] [facility]  Referring Provider  Dorthula Perfect Date: 11/16/2017  Check In: Session Check In - 11/16/17 1022      Check-In   Physician(s)  Drs. Domenic Polite    Location  [facility]    Staff Present  Alberteen Sam, MA, RCEP, CCRP, Exercise Physiologist;Joseph [employer], [place], ACSM CEP, Exercise Physiologist    Medication changes reported      No    Fall or balance concerns reported     No    Warm-up and Cool-down  Performed as group-led instruction    Resistance Training Performed  Yes    VAD Patient?  No    PAD/SET Patient?  No      Pain Assessment   Currently in Pain?  No/denies          Social History   Tobacco Use  Smoking Status Never Smoker  Smokeless Tobacco Never Used    Goals Met:  Proper associated with RPD/PD & O2 Sat Independence with exercise equipment Using PLB without cueing & demonstrates good technique Exercise tolerated well No report of cardiac concerns or symptoms Strength training completed today  Goals Unmet:  Not Applicable  Comments: Pt able to follow exercise prescription today without complaint.  Will continue to monitor for progression. Reviewed home exercise with pt today.  Pt plans to walk at home for exercise.  He will re join [employer] after graduation.  Reviewed THR, pulse, RPE, sign and symptoms, NTG use, and when to call 911 or MD.  Also discussed weather considerations and indoor options.  Pt voiced understanding.     Dr. Emily Filbert is Medical Director for [place] and [name].

## 2017-11-23 ENCOUNTER — Encounter: Payer: Medicare Other | Attending: Internal Medicine

## 2017-11-23 DIAGNOSIS — I2721 Secondary pulmonary arterial hypertension: Secondary | ICD-10-CM | POA: Insufficient documentation

## 2017-11-23 DIAGNOSIS — J8410 Pulmonary fibrosis, unspecified: Secondary | ICD-10-CM

## 2017-11-23 NOTE — Progress Notes (Signed)
Daily Session Note  Patient Details  Name: Vincent Kennedy MRN: [id number] Date of Birth: [birth year] Referring Provider:     Pulmonary Rehab from 11/07/2017 in [place] [facility]  Referring Provider  Dorthula Perfect Date: 11/23/2017  Check In:      Social History   Tobacco Use  Smoking Status Never Smoker  Smokeless Tobacco Never Used    Goals Met:  Proper associated with RPD/PD & O2 Sat Independence with exercise equipment Exercise tolerated well Strength training completed today  Goals Unmet:  Not Applicable  Comments: Pt able to follow exercise prescription today without complaint.  Will continue to monitor for progression.    Dr. Emily Filbert is Medical Director for [place] and [name].

## 2017-11-25 ENCOUNTER — Encounter: Payer: Medicare Other | Admitting: *Deleted

## 2017-11-25 DIAGNOSIS — J8410 Pulmonary fibrosis, unspecified: Secondary | ICD-10-CM

## 2017-11-25 DIAGNOSIS — I2721 Secondary pulmonary arterial hypertension: Secondary | ICD-10-CM

## 2017-11-25 NOTE — Progress Notes (Signed)
Daily Session Note  Patient Details  Name: Vincent Kennedy MRN: [id number] Date of Birth: [birth year] Referring Provider:     Pulmonary Rehab from 11/07/2017 in [place] [facility]  Referring Provider  Dorthula Perfect Date: 11/25/2017  Check In: Session Check In - 11/25/17 1007      Check-In   Supervising physician immediately available to respond to emergencies  [name] immediately available ER MD    Physician(s)  Dr. Corky Downs and [facility]    Location  [facility]    Staff Present  Renita Papa, RN BSN;Jessica Luan Pulling, MA, RCEP, CCRP, Exercise Physiologist;Joseph Tessie Fass RCP,RRT,BSRT    Medication changes reported      No    Fall or balance concerns reported     No    Tobacco Cessation  No Change    Warm-up and Cool-down  Performed as group-led instruction    Resistance Training Performed  Yes    VAD Patient?  No    PAD/SET Patient?  No      Pain Assessment   Currently in Pain?  No/denies          Social History   Tobacco Use  Smoking Status Never Smoker  Smokeless Tobacco Never Used    Goals Met:  Proper associated with RPD/PD & O2 Sat Independence with exercise equipment Using PLB without cueing & demonstrates good technique Exercise tolerated well No report of cardiac concerns or symptoms Strength training completed today  Goals Unmet:  Not Applicable  Comments: Pt able to follow exercise prescription today without complaint.  Will continue to monitor for progression.    Dr. Emily Filbert is Medical Director for [place] and [name].

## 2017-11-28 ENCOUNTER — Encounter: Payer: Medicare Other | Admitting: *Deleted

## 2017-11-28 DIAGNOSIS — I2721 Secondary pulmonary arterial hypertension: Secondary | ICD-10-CM | POA: Diagnosis not present

## 2017-11-28 DIAGNOSIS — J8410 Pulmonary fibrosis, unspecified: Secondary | ICD-10-CM

## 2017-11-28 NOTE — Progress Notes (Signed)
Daily Session Note  Patient Details  Name: [name] MRN: [id number] Date of Birth: 1932-10-17 Referring Provider:     Pulmonary Rehab from 11/07/2017 in [place] [facility]  Referring Provider  Dorthula Perfect Date: 11/28/2017  Check In: Session Check In - 11/28/17 1010      Check-In   Supervising physician immediately available to respond to emergencies  [name] immediately available ER MD    Location  [facility]    Staff Present  Earlean Shawl, BS, ACSM CEP, Exercise Physiologist;Amanda Oletta Darter, BA, ACSM CEP, Exercise Physiologist;Joseph Tessie Fass RCP,RRT,BSRT    Medication changes reported      No    Fall or balance concerns reported     No    Tobacco Cessation  No Change    Warm-up and Cool-down  Performed as group-led instruction    Resistance Training Performed  Yes    VAD Patient?  No    PAD/SET Patient?  No      Pain Assessment   Multiple Pain Sites  No          Social History   Tobacco Use  Smoking Status Never Smoker  Smokeless Tobacco Never Used    Goals Met:  Proper associated with RPD/PD & O2 Sat Independence with exercise equipment Exercise tolerated well No report of cardiac concerns or symptoms Strength training completed today  Goals Unmet:  Not Applicable  Comments: Pt able to follow exercise prescription today without complaint.  Will continue to monitor for progression.    Dr. Emily Filbert is Medical Director for [place] and [name].

## 2017-11-30 ENCOUNTER — Encounter: Payer: Medicare Other | Admitting: *Deleted

## 2017-11-30 DIAGNOSIS — I2721 Secondary pulmonary arterial hypertension: Secondary | ICD-10-CM

## 2017-11-30 NOTE — Progress Notes (Signed)
Daily Session Note  Patient Details  Name: Vincent Kennedy MRN: [id number] Date of Birth: [birth year] Referring Provider:     Pulmonary Rehab from 11/07/2017 in [place] [facility]  Referring Provider  Dorthula Perfect Date: 11/30/2017  Check In: Session Check In - 11/30/17 1132      Check-In   Supervising physician immediately available to respond to emergencies  [name] immediately available ER MD    Physician(s)  Drs. Alyse Low    Location  [facility]    Staff Present  Alberteen Sam, MA, RCEP, CCRP, Exercise Physiologist;Joseph Mims;Heath Lark, RN, BSN, CCRP    Medication changes reported      No    Fall or balance concerns reported     No    Warm-up and Cool-down  Performed as group-led instruction    Resistance Training Performed  Yes    VAD Patient?  No    PAD/SET Patient?  No      Pain Assessment   Currently in Pain?  No/denies          Social History   Tobacco Use  Smoking Status Never Smoker  Smokeless Tobacco Never Used    Goals Met:  Proper associated with RPD/PD & O2 Sat Independence with exercise equipment Using PLB without cueing & demonstrates good technique Exercise tolerated well No report of cardiac concerns or symptoms Strength training completed today  Goals Unmet:  Not Applicable  Comments: Pt able to follow exercise prescription today without complaint.  Will continue to monitor for progression.    Dr. Emily Filbert is Medical Director for [place] and [name].

## 2017-12-01 ENCOUNTER — Ambulatory Visit (INDEPENDENT_AMBULATORY_CARE_PROVIDER_SITE_OTHER): Payer: Medicare Other | Admitting: Cardiovascular Disease

## 2017-12-01 ENCOUNTER — Encounter: Payer: Self-pay | Admitting: Cardiovascular Disease

## 2017-12-01 VITALS — BP 120/54 | HR 64 | Ht 66.5 in | Wt 149.0 lb

## 2017-12-01 DIAGNOSIS — E7800 Pure hypercholesterolemia, unspecified: Secondary | ICD-10-CM

## 2017-12-01 DIAGNOSIS — I10 Essential (primary) hypertension: Secondary | ICD-10-CM | POA: Diagnosis not present

## 2017-12-01 DIAGNOSIS — I2510 Atherosclerotic heart disease of native coronary artery without angina pectoris: Secondary | ICD-10-CM

## 2017-12-01 NOTE — Progress Notes (Signed)
Cardiology Office Note   Date:  12/01/2017   ID:  Vincent Kennedy, DOB [birth year], MRN [id number]  PCP:  [facility] [place] [place]  Cardiologist:   [name], MD   Chief Complaint  Patient presents with  . other    2 month follow up. Meds reviewed by the pt. verbally. Pt. c/o shortness of breath.       History of Present Illness: Vincent Kennedy is a 84 y.o. male who is here today for follow-up visit regarding coronary artery disease. He is a retired Development worker, international [employer].  He had previous first diagonal ostial drug-eluting stent placement in 2008. He was diagnosed with pulmonary fibrosis in 2016. He is on continuous oxygen therapy.  He had worsening exertional dyspnea and underwent a right and left cardiac catheterization in October of 2018 which showed severe one-vessel coronary artery disease with patent diagonal stent, 95% heavily calcified stenosis in the mid LAD at the origin of the second diagonal and moderate proximal LAD stenosis.  Ejection fraction was normal.  Right heart catheterization showed normal filling pressures, mild pulmonary hypertension and normal cardiac output.  Saturation run showed evidence of a step up at the atrial level suggestive of a possible ASD.  However, IVC sat was not obtained which could have affected the results. I performed successful angioplasty and drug-eluting stent placement to the proximal and mid LAD.  He had worsening exertional dyspnea in June of this year.  I proceeded with cardiac catheterization which showed widely patent LAD and diagonal stents with no significant restenosis.  There was stable 40% ostial LAD stenosis.  LVEDP was mildly elevated.  He had an echocardiogram done with [name] which showed normal LV systolic function with mild pulmonary hypertension.  Systolic pulmonary pressure was 36 mmHg.  He has been doing well with no recent chest pain.  Exertional dyspnea is stable.  He is going to pulmonary rehab in a regular  basis with overall improvement in functional capacity.  Past Medical History:  Diagnosis Date  . Abnormal nuclear cardiac imaging test    Dr. [name]  . BPH (benign prostatic hyperplasia)   . Coronary artery disease    Cardiac cath September 2008: 20% left main stenosis, 40% mid LAD stenosis, 99% ostial first diagonal stenosis in a large branch and 90% ostial disease in second diagonal but the vessel was very small, 20% mid RCA stenosis with normal ejection fraction. Successful angioplasty and drug-eluting stent placement to the ostial first diagonal with a 2.5 x 15 mm Xience stent  . GERD (gastroesophageal reflux disease)   . Hyperlipidemia   . Hypertension    BP has never been high(per pt).  Metoprolol is to keep BP low because of stent (afterload)  . On home oxygen therapy    "2-3L; 24/7" (01/19/2017)  . OSA on CPAP   . Oxygen deficiency   . Pneumonia 07/2016  . Pulmonary fibrosis (HCC) 08/26/2015  . Shortness of breath dyspnea    with exertion  . Wears hearing aid    (sometimes)    Past Surgical History:  Procedure Laterality Date  . CATARACT EXTRACTION W/PHACO Right 01/29/2015   Procedure: CATARACT EXTRACTION PHACO AND INTRAOCULAR LENS PLACEMENT (IOC);  Surgeon: Lockie Mola, MD;  Location: [facility];  Service: Ophthalmology;  Laterality: Right;  RESTOR LENS CPAP  . CATARACT EXTRACTION W/PHACO Left 03/12/2015   Procedure: CATARACT EXTRACTION PHACO AND INTRAOCULAR LENS PLACEMENT (IOC);  Surgeon: Lockie Mola, MD;  Location: [facility];  Service: Ophthalmology;  Laterality: Left;  RESTOR SHUGARCAINE  . COLONOSCOPY WITH ESOPHAGOGASTRODUODENOSCOPY (EGD)    . CORONARY ANGIOPLASTY WITH STENT PLACEMENT  2010   "1 stent"  . CORONARY ANGIOPLASTY WITH STENT PLACEMENT  01/19/2017  . CORONARY ATHERECTOMY N/A 01/19/2017   Procedure: CORONARY ATHERECTOMY - CSI;  Surgeon: Iran Ouch, MD;  Location: [facility];  Service: Cardiovascular;   Laterality: N/A;  unable to cross lesion  . CORONARY STENT INTERVENTION N/A 01/19/2017   Procedure: CORONARY STENT INTERVENTION;  Surgeon: Iran Ouch, MD;  Location: [facility];  Service: Cardiovascular;  Laterality: N/A;  . INGUINAL HERNIA REPAIR Bilateral   . LEFT HEART CATH AND CORONARY ANGIOGRAPHY Left 09/12/2017   Procedure: LEFT HEART CATH AND CORONARY ANGIOGRAPHY;  Surgeon: Iran Ouch, MD;  Location: [facility];  Service: Cardiovascular;  Laterality: Left;  . RIGHT/LEFT HEART CATH AND CORONARY ANGIOGRAPHY N/A 01/12/2017   Procedure: RIGHT/LEFT HEART CATH AND CORONARY ANGIOGRAPHY;  Surgeon: Iran Ouch, MD;  Location: [facility];  Service: Cardiovascular;  Laterality: N/A;     Current Outpatient Medications  Medication Sig Dispense Refill  . aspirin EC 81 MG tablet Take 1 tablet (81 mg total) by mouth daily. 30 tablet 6  . budesonide (PULMICORT) 0.5 MG/2ML nebulizer solution Take 2 mLs (0.5 mg total) by nebulization 2 (two) times daily. 260 mL 3  . clopidogrel (PLAVIX) 75 MG tablet Take 1 tablet (75 mg total) by mouth daily. 90 tablet 3  . finasteride (PROSCAR) 5 MG tablet Take 1 tablet (5 mg total) by mouth daily. 90 tablet 3  . metoprolol tartrate (LOPRESSOR) 25 MG tablet Take 1 tablet (25 mg total) by mouth 2 (two) times daily. 180 tablet 3  . mometasone-formoterol (DULERA) 100-5 MCG/ACT AERO Inhale 2 puffs into the lungs 2 (two) times daily. 1 Inhaler 1  . nitroGLYCERIN (NITROSTAT) 0.4 MG SL tablet Place 1 tablet (0.4 mg total) under the tongue every 5 (five) minutes as needed for chest pain. 25 tablet 12  . pantoprazole (PROTONIX) 40 MG tablet Take 1 tablet (40 mg total) by mouth daily. (Patient taking differently: Take 40 mg by mouth daily as needed (heartburn). ) 30 tablet 6  . Pirfenidone (ESBRIET) 267 MG CAPS Take 801 mg by mouth 3 (three) times daily.     . rivastigmine (EXELON) 9.5 mg/24hr Place 1 patch (9.5 mg total) onto the skin  daily. 90 patch 2  . rosuvastatin (CRESTOR) 40 MG tablet Take 40 mg by mouth every day 90 tablet 3  . zolpidem (AMBIEN) 10 MG tablet TAKE 1 TABLET BY MOUTH AT  BEDTIME AS NEEDED 90 tablet 2   No current facility-administered medications for this visit.     Allergies:   Patient has no known allergies.    Social History:  The patient  reports that he has never smoked. He has never used smokeless tobacco. He reports that he does not drink alcohol or use drugs.   Family History:  The patient's family history includes CAD in his brother and father.    ROS:  Please see the history of present illness.   Otherwise, review of systems are positive for none.   All other systems are reviewed and negative.    PHYSICAL EXAM: VS:  BP (!) 120/54 (BP Location: Left Arm, Patient Position: Sitting, Cuff Size: Normal)   Pulse 64   Ht 5' 6.5" (1.689 m)   Wt 149 lb (67.6 kg)   BMI  23.69 kg/m  , BMI Body mass index is 23.69 kg/m. GEN: Well nourished, well developed, in no acute distress  HEENT: normal  Neck: no JVD, carotid bruits, or masses Cardiac: RRR; no murmurs, rubs, or gallops,no edema  Respiratory: Diminished breath sounds with dry crackles. GI: soft, nontender, nondistended, + BS MS: no deformity or atrophy  Skin: warm and dry, no rash Neuro:  Strength and sensation are intact Psych: euthymic mood, full affect   EKG:  EKG is ordered today. The ekg ordered today demonstrates normal sinus rhythm with left atrial enlargement and no significant ST or T wave changes.  Borderline LVH.   Recent Labs: 03/11/2017: ALT 11 09/02/2017: BUN 12; Creatinine, Ser 0.81; Hemoglobin 14.0; Platelets 213; Potassium 4.3; Sodium 140    Lipid Panel    Component Value Date/Time   CHOL 131 03/11/2017 1055   TRIG 99 03/11/2017 1055   HDL 38 (L) 03/11/2017 1055   CHOLHDL 3.4 03/11/2017 1055   CHOLHDL 5.5 (H) 12/24/2015 1105   VLDL 21 12/24/2015 1105   LDLCALC 73 03/11/2017 1055      Wt Readings from  Last 3 Encounters:  12/01/17 149 lb (67.6 kg)  11/07/17 150 lb 11.2 oz (68.4 kg)  11/01/17 152 lb (68.9 kg)     No flowsheet data found.    ASSESSMENT AND PLAN:  1.  Coronary artery disease involving native coronary arteries without angina: He is doing very well at the present time.  Cardiac catheterization in June showed patent stents with no obstructive disease.  Continue dual antiplatelet therapy.  2. Hyperlipidemia: Currently on rosuvastatin 40 mg once daily.   Most recent LDL was 73 which is close to target.  3. Pulmonary fibrosis: Managed by [facility] and Dr. Welton FlakesKhan.   4. Essential hypertension: Blood pressure is well controlled on metoprolol and amlodipine.   Disposition:   FU with me in 6 months  Signed,  Lorine BearsMuhammad Mackensi Mahadeo, MD  12/01/2017 10:46 AM    [facility]

## 2017-12-01 NOTE — Patient Instructions (Signed)
Medication Instructions: Continue same medications.   Labwork: None.   Procedures/Testing: None.   Follow-Up: 6 months with Dr. Arida.   Any Additional Special Instructions Will Be Listed Below (If Applicable).     If you need a refill on your cardiac medications before your next appointment, please call your pharmacy.   

## 2017-12-02 DIAGNOSIS — I2721 Secondary pulmonary arterial hypertension: Secondary | ICD-10-CM

## 2017-12-02 DIAGNOSIS — J8410 Pulmonary fibrosis, unspecified: Secondary | ICD-10-CM

## 2017-12-02 NOTE — Progress Notes (Signed)
Daily Session Note  Patient Details  Name: Vincent Kennedy MRN: [id number] Date of Birth: [birth year] Referring Provider:     Pulmonary Rehab from 11/07/2017 in [place] and [facility]  Referring Provider  Dorthula Perfect Date: 12/02/2017  Check In: Session Check In - 12/02/17 1018      Check-In   Supervising physician immediately available to respond to emergencies  [name] immediately available ER MD    Physician(s)  Dr. Kerman Passey and [facility]    Location  [facility]    Staff Present  Renita Papa, RN BSN;[facility]ah [facility], [place], ACSM CEP, Exercise Physiologist    Medication changes reported      No    Fall or balance concerns reported     No    Tobacco Cessation  No Change    Warm-up and Cool-down  Performed as group-led instruction    Resistance Training Performed  Yes    VAD Patient?  No    PAD/SET Patient?  No      Pain Assessment   Currently in Pain?  No/denies          Social History   Tobacco Use  Smoking Status Never Smoker  Smokeless Tobacco Never Used    Goals Met:  Proper associated with RPD/PD & O2 Sat Independence with exercise equipment Using PLB without cueing & demonstrates good technique Exercise tolerated well No report of cardiac concerns or symptoms Strength training completed today  Goals Unmet:  Not Applicable  Comments: Pt able to follow exercise prescription today without complaint.  Will continue to monitor for progression.    Dr. Emily Filbert is Medical Director for [place] and [name].

## 2017-12-02 NOTE — Addendum Note (Signed)
Addended by: Festus AloeRESPO, SHARON G on: 12/02/2017 12:47 PM   Modules accepted: Orders

## 2017-12-07 DIAGNOSIS — I2721 Secondary pulmonary arterial hypertension: Secondary | ICD-10-CM

## 2017-12-07 DIAGNOSIS — J8410 Pulmonary fibrosis, unspecified: Secondary | ICD-10-CM

## 2017-12-07 NOTE — Progress Notes (Signed)
Daily Session Note  Patient Details  Name: BOWMAN HIGBIE MRN: [id number] Date of Birth: [birth year] Referring Provider:     Pulmonary Rehab from 11/07/2017 in [place] and [facility]  Referring Provider  Dorthula Perfect Date: 12/07/2017  Check In: Session Check In - 12/07/17 1024      Check-In   Supervising physician immediately available to respond to emergencies  [name] immediately available ER MD    Physician(s)  Mariea Clonts and Alfred Levins    Location  [facility]    Staff Present  Alberteen Sam, MA, RCEP, CCRP, Exercise Physiologist;Joseph Foy [place], [place], ACSM CEP, Exercise Physiologist    Medication changes reported      No    Fall or balance concerns reported     No    Warm-up and Cool-down  Performed as group-led instruction    Resistance Training Performed  Yes    VAD Patient?  No    PAD/SET Patient?  No      Pain Assessment   Currently in Pain?  No/denies    Multiple Pain Sites  No          Social History   Tobacco Use  Smoking Status Never Smoker  Smokeless Tobacco Never Used    Goals Met:  Proper associated with RPD/PD & O2 Sat Independence with exercise equipment Exercise tolerated well Strength training completed today  Goals Unmet:  Not Applicable  Comments: Pt able to follow exercise prescription today without complaint.  Will continue to monitor for progression.    Dr. Emily Filbert is Medical Director for [place] and [name].

## 2017-12-09 DIAGNOSIS — I2721 Secondary pulmonary arterial hypertension: Secondary | ICD-10-CM

## 2017-12-09 NOTE — Progress Notes (Signed)
Daily Session Note  Patient Details  Name: Vincent Kennedy MRN: [id number] Date of Birth: 1932-09-29 Referring Provider:     Pulmonary Rehab from 11/07/2017 in [place] and Pul[facility] Re[facility]  Referring Provider  Dorthula Perfect Date: 12/09/2017  Check In: Session Check In - 12/09/17 1020      Check-In   Supervising physician immediately available to respond to emergencies  [name] immediately available ER MD    Physician(s)  Dr. Archie Balboa and Quentin Cornwall    Location  [facility]    Staff Present  Justin Mend RCP,RRT,BSRT;Amanda Oletta Darter, [place], ACSM CEP, Exercise Physiologist;Meredith Sherryll Burger, RN BSN    Medication changes reported      No    Fall or balance concerns reported     No    Warm-up and Cool-down  Performed as group-led Higher education careers adviser Performed  Yes    VAD Patient?  No      Pain Assessment   Currently in Pain?  No/denies          Social History   Tobacco Use  Smoking Status Never Smoker  Smokeless Tobacco Never Used    Goals Met:  Independence with exercise equipment Exercise tolerated well No report of cardiac concerns or symptoms Strength training completed today  Goals Unmet:  Not Applicable  Comments: Pt able to follow exercise prescription today without complaint.  Will continue to monitor for progression.    Dr. Emily Filbert is Medical Director for [place] and [name].

## 2017-12-12 ENCOUNTER — Encounter: Payer: Medicare Other | Admitting: *Deleted

## 2017-12-12 DIAGNOSIS — I2721 Secondary pulmonary arterial hypertension: Secondary | ICD-10-CM | POA: Diagnosis not present

## 2017-12-12 DIAGNOSIS — Z955 Presence of coronary angioplasty implant and graft: Secondary | ICD-10-CM

## 2017-12-12 DIAGNOSIS — J8410 Pulmonary fibrosis, unspecified: Secondary | ICD-10-CM

## 2017-12-12 NOTE — Progress Notes (Signed)
Pulmonary Individual Treatment Plan  Patient Details  Name: Vincent Kennedy MRN: [id number] Date of Birth: [birth year] Referring Provider:     Pulmonary Rehab from 11/07/2017 in [facility]  Referring Provider  [name]      Initial Encounter Date:    Pulmonary Rehab from 11/07/2017 in [facility]  Date  11/07/17      Visit Diagnosis: Pulmonary artery hypertension ([place])  Patient's Home Medications on Admission:  Current Outpatient Medications:  .  aspirin EC 81 MG tablet, Take 1 tablet (81 mg total) by mouth daily., Disp: 30 tablet, Rfl: 6 .  budesonide (PULMICORT) 0.5 MG/2ML nebulizer solution, Take 2 mLs (0.5 mg total) by nebulization 2 (two) times daily., Disp: 260 mL, Rfl: 3 .  clopidogrel (PLAVIX) 75 MG tablet, Take 1 tablet (75 mg total) by mouth daily., Disp: 90 tablet, Rfl: 3 .  finasteride (PROSCAR) 5 MG tablet, Take 1 tablet (5 mg total) by mouth daily., Disp: 90 tablet, Rfl: 3 .  metoprolol tartrate (LOPRESSOR) 25 MG tablet, Take 1 tablet (25 mg total) by mouth 2 (two) times daily., Disp: 180 tablet, Rfl: 3 .  mometasone-formoterol (DULERA) 100-5 MCG/ACT AERO, Inhale 2 puffs into the lungs 2 (two) times daily., Disp: 1 Inhaler, Rfl: 1 .  nitroGLYCERIN (NITROSTAT) 0.4 MG SL tablet, Place 1 tablet (0.4 mg total) under the tongue every 5 (five) minutes as needed for chest pain., Disp: 25 tablet, Rfl: 12 .  pantoprazole (PROTONIX) 40 MG tablet, Take 1 tablet (40 mg total) by mouth daily. (Patient taking differently: Take 40 mg by mouth daily as needed (heartburn). ), Disp: 30 tablet, Rfl: 6 .  Pirfenidone (ESBRIET) 267 MG CAPS, Take 801 mg by mouth 3 (three) times daily. , Disp: , Rfl:  .  rivastigmine (EXELON) 9.5 mg/24hr, Place 1 patch (9.5 mg total) onto the skin daily., Disp: 90 patch, Rfl: 2 .  rosuvastatin (CRESTOR) 40 MG tablet, Take 40 mg by mouth every day, Disp: 90 tablet, Rfl: 3 .  zolpidem (AMBIEN) 10 MG tablet, TAKE 1 TABLET BY  MOUTH AT  BEDTIME AS NEEDED, Disp: 90 tablet, Rfl: 2  Past Medical History: Past Medical History:  Diagnosis Date  . Abnormal nuclear cardiac imaging test    Dr. Fletcher Anon  . BPH (benign prostatic hyperplasia)   . Coronary artery disease    Cardiac cath September 2008: 20% left main stenosis, 40% mid LAD stenosis, 99% ostial first diagonal stenosis in a large branch and 90% ostial disease in second diagonal but the vessel was very small, 20% mid RCA stenosis with normal ejection fraction. Successful angioplasty and drug-eluting stent placement to the ostial first diagonal with a 2.5 x 15 mm Xience stent  . GERD (gastroesophageal reflux disease)   . Hyperlipidemia   . Hypertension    BP has never been high(per pt).  Metoprolol is to keep BP low because of stent (afterload)  . On home oxygen therapy    "2-3L; 24/7" (01/19/2017)  . OSA on CPAP   . Oxygen deficiency   . Pneumonia 07/2016  . Pulmonary fibrosis ([place]) 08/26/2015  . Shortness of breath dyspnea    with exertion  . Wears hearing aid    (sometimes)    Tobacco Use: Social History   Tobacco Use  Smoking Status Never Smoker  Smokeless Tobacco Never Used    Labs: Recent Review Scientist, physiological    Labs for ITP Cardiac and Pulmonary Rehab Latest Ref Rng & Units 01/12/2017  01/12/2017 01/12/2017 01/12/2017 03/11/2017   Cholestrol 100 - 199 mg/dL - - - - 131   LDLCALC 0 - 99 mg/dL - - - - 73   HDL >39 mg/dL - - - - 38(L)   Trlycerides 0 - 149 mg/dL - - - - 99   PHART 7.350 - 7.450 7.378 - - - -   PCO2ART 32.0 - 48.0 mmHg 41.3 - - - -   HCO3 20.0 - 28.0 mmol/L 24.3 23.8 24.1 25.3 -   TCO2 22 - 32 mmol/L _0 -   ACIDBASEDEF 0.0 - 2.0 mmol/L 1.0 2.0 1.0 1.0 -   O2SAT % 98.0 77.0 76.0 64.0 -       Pulmonary Assessment Scores: Pulmonary Assessment Scores    Row Name 11/07/17 1448         ADL UCSD   ADL Phase  Entry     SOB Score total  25     Rest  0     Walk  1     Stairs  2     Bath  1     Dress  1     Shop   2       CAT Score   CAT Score  11       mMRC Score   mMRC Score  2        Pulmonary Function Assessment: Pulmonary Function Assessment - 11/07/17 1509      Breath   Bilateral Breath Sounds  Clear;Decreased    Shortness of Breath  Yes;Fear of Shortness of Breath;Limiting activity       Exercise Target Goals: Exercise Program Goal: Individual exercise prescription set using results from initial 6 min walk test and THRR while considering  patient's activity barriers and safety.   Exercise Prescription Goal: Initial exercise prescription builds to 30-45 minutes a day of aerobic activity, 2-3 days per week.  Home exercise guidelines will be given to patient during program as part of exercise prescription that the participant will acknowledge.  Activity Barriers & Risk Stratification:   6 Minute Walk: 6 Minute Walk    Row Name 11/07/17 1520         6 Minute Walk   Distance  542 feet     Walk Time  3.6 minutes     # of Rest Breaks  1     MPH  1.7     METS  2.3     RPE  13     Perceived Dyspnea   1     VO2 Peak  3.46     Symptoms  Yes (comment)     Comments  O2 dropped during test     Resting HR  64 bpm     Resting BP  104/58     Resting Oxygen Saturation   95 %     Exercise Oxygen Saturation  during 6 min walk  78 %     Max Ex. HR  97 bpm     Max Ex. BP  134/66     2 Minute Post BP  122/64       Interval HR   1 Minute HR  97     2 Minute HR  97     4 Minute HR  83     5 Minute HR  71     6 Minute HR  95     2 Minute Post HR  68  Interval Heart Rate?  Yes       Interval Oxygen   Interval Oxygen?  Yes     Baseline Oxygen Saturation %  95 %     1 Minute Oxygen Saturation %  89 %     1 Minute Liters of Oxygen  4 L pulsed     2 Minute Oxygen Saturation %  79 %     2 Minute Liters of Oxygen  4 L     3 Minute Liters of Oxygen  4 L     4 Minute Oxygen Saturation %  78 %     4 Minute Liters of Oxygen  4 L     5 Minute Oxygen Saturation %  82 %     5 Minute  Liters of Oxygen  4 L     6 Minute Oxygen Saturation %  88 %     6 Minute Liters of Oxygen  4 L     2 Minute Post Oxygen Saturation %  93 %     2 Minute Post Liters of Oxygen  4 L       Oxygen Initial Assessment: Oxygen Initial Assessment - 11/07/17 1507      Home Oxygen   Home Oxygen Device  Portable Concentrator;Home Concentrator;E-Tanks    Sleep Oxygen Prescription  CPAP    Liters per minute  3    Home Exercise Oxygen Prescription  Continuous    Liters per minute  4    Home at Rest Exercise Oxygen Prescription  Continuous    Liters per minute  3    Compliance with Home Oxygen Use  Yes      Initial 6 min Walk   Oxygen Used  Portable Concentrator;Pulsed    Liters per minute  5      Program Oxygen Prescription   Program Oxygen Prescription  Continuous    Liters per minute  5      Intervention   Short Term Goals  To learn and demonstrate proper use of respiratory medications;To learn and understand importance of maintaining oxygen saturations>88%;To learn and exhibit compliance with exercise, home and travel O2 prescription;To learn and understand importance of monitoring SPO2 with pulse oximeter and demonstrate accurate use of the pulse oximeter.;To learn and demonstrate proper pursed lip breathing techniques or other breathing techniques.    Long  Term Goals  Compliance with respiratory medication;Demonstrates proper use of MDI's;Exhibits proper breathing techniques, such as pursed lip breathing or other method taught during program session;Maintenance of O2 saturations>88%;Verbalizes importance of monitoring SPO2 with pulse oximeter and return demonstration;Exhibits compliance with exercise, home and travel O2 prescription       Oxygen Re-Evaluation: Oxygen Re-Evaluation    Row Name 11/28/17 1643             Program Oxygen Prescription   Program Oxygen Prescription  Continuous       Liters per minute  4       Comments  6 Liters on the treadmill         Home Oxygen    Home Oxygen Device  Portable Concentrator;Home Concentrator;E-Tanks       Sleep Oxygen Prescription  CPAP       Liters per minute  3       Home Exercise Oxygen Prescription  Continuous       Liters per minute  4       Home at Rest Exercise Oxygen Prescription  Continuous  Liters per minute  3       Compliance with Home Oxygen Use  Yes         Goals/Expected Outcomes   Short Term Goals  To learn and demonstrate proper use of respiratory medications;To learn and understand importance of maintaining oxygen saturations>88%;To learn and exhibit compliance with exercise, home and travel O2 prescription;To learn and understand importance of monitoring SPO2 with pulse oximeter and demonstrate accurate use of the pulse oximeter.;To learn and demonstrate proper pursed lip breathing techniques or other breathing techniques.       Long  Term Goals  Compliance with respiratory medication;Demonstrates proper use of MDI's;Exhibits proper breathing techniques, such as pursed lip breathing or other method taught during program session;Maintenance of O2 saturations>88%;Verbalizes importance of monitoring SPO2 with pulse oximeter and return demonstration;Exhibits compliance with exercise, home and travel O2 prescription       Comments  Patient has been using 3 liters of oxygen at home while resting. He uses his pulse oximeter to measure his oxygen and states he is in the mid 90's while resting. He states he is taking his nebulizers and inhalers as prescribed. He is workinhg on using PLB more to help when he gets short of breath.       Goals/Expected Outcomes  Short: Become more profiecient at using PLB.   Long: Become independent at using PLB.          Oxygen Discharge (Final Oxygen Re-Evaluation): Oxygen Re-Evaluation - 11/28/17 1643      Program Oxygen Prescription   Program Oxygen Prescription  Continuous    Liters per minute  4    Comments  6 Liters on the treadmill      Home Oxygen   Home  Oxygen Device  Portable Concentrator;Home Concentrator;E-Tanks    Sleep Oxygen Prescription  CPAP    Liters per minute  3    Home Exercise Oxygen Prescription  Continuous    Liters per minute  4    Home at Rest Exercise Oxygen Prescription  Continuous    Liters per minute  3    Compliance with Home Oxygen Use  Yes      Goals/Expected Outcomes   Short Term Goals  To learn and demonstrate proper use of respiratory medications;To learn and understand importance of maintaining oxygen saturations>88%;To learn and exhibit compliance with exercise, home and travel O2 prescription;To learn and understand importance of monitoring SPO2 with pulse oximeter and demonstrate accurate use of the pulse oximeter.;To learn and demonstrate proper pursed lip breathing techniques or other breathing techniques.    Long  Term Goals  Compliance with respiratory medication;Demonstrates proper use of MDI's;Exhibits proper breathing techniques, such as pursed lip breathing or other method taught during program session;Maintenance of O2 saturations>88%;Verbalizes importance of monitoring SPO2 with pulse oximeter and return demonstration;Exhibits compliance with exercise, home and travel O2 prescription    Comments  Patient has been using 3 liters of oxygen at home while resting. He uses his pulse oximeter to measure his oxygen and states he is in the mid 90's while resting. He states he is taking his nebulizers and inhalers as prescribed. He is workinhg on using PLB more to help when he gets short of breath.    Goals/Expected Outcomes  Short: Become more profiecient at using PLB.   Long: Become independent at using PLB.       Initial Exercise Prescription: Initial Exercise Prescription - 11/07/17 1500      Date of Initial Exercise RX and Referring  Provider   Date  11/07/17    Referring Provider  [name]      Treadmill   MPH  1.5    Grade  0    Minutes  15    METs  2.15      NuStep   Level  2    SPM  80     Minutes  15    METs  2      Arm Ergometer   Level  1    RPM  40    Minutes  15    METs  2      Prescription Details   Frequency (times per week)  3    Duration  Progress to 45 minutes of aerobic exercise without signs/symptoms of physical distress      Intensity   THRR 40-80% of Max Heartrate  93-122    Ratings of Perceived Exertion  11-13    Perceived Dyspnea  0-4      Resistance Training   Training Prescription  Yes    Weight  2 lb    Reps  10-15       Perform Capillary Blood Glucose checks as needed.  Exercise Prescription Changes: Exercise Prescription Changes    Row Name 11/16/17 1000 11/16/17 1100 11/30/17 1200         Response to Exercise   Blood Pressure (Admit)  -  134/65  130/64     Blood Pressure (Exit)  -  122/68  128/56     Heart Rate (Admit)  -  70 bpm  72 bpm     Heart Rate (Exercise)  -  85 bpm  81 bpm     Heart Rate (Exit)  -  68 bpm  80 bpm     Oxygen Saturation (Admit)  -  93 %  96 %     Oxygen Saturation (Exercise)  -  85 % inc O2 to 6L - 93% after  87 %     Oxygen Saturation (Exit)  -  89 %  91 %     Rating of Perceived Exertion (Exercise)  -  13  12     Perceived Dyspnea (Exercise)  -  2  2     Symptoms  -  none  none     Duration  -  Continue with 45 min of aerobic exercise without signs/symptoms of physical distress.  Continue with 45 min of aerobic exercise without signs/symptoms of physical distress.     Intensity  -  THRR unchanged meeting RPE goals  THRR unchanged       Progression   Progression  -  Continue to progress workloads to maintain intensity without signs/symptoms of physical distress.  Continue to progress workloads to maintain intensity without signs/symptoms of physical distress.     Average METs  -  2  1.6       Resistance Training   Training Prescription  -  Yes  Yes     Weight  -  3 lb  3 lb     Reps  -  10-15  10-15       Interval Training   Interval Training  -  No  No       Oxygen   Oxygen  -  Continuous   Continuous     Liters  -  6  6       NuStep   Level  -  2  2  SPM  -  80  80     Minutes  -  15  15     METs  -  2.2  1.8       Arm Ergometer   Level  -  1  1     RPM  -  40  40     Minutes  -  15  15     METs  -  1.9  1.2       Home Exercise Plan   Plans to continue exercise at  Home (comment) walking  Home (comment) walking  Home (comment) walking     Frequency  Add 1 additional day to program exercise sessions.  Add 1 additional day to program exercise sessions.  Add 1 additional day to program exercise sessions.     Initial Home Exercises Provided  11/16/17  11/16/17  11/16/17        Exercise Comments: Exercise Comments    Row Name 11/09/17 1040           Exercise Comments   First full day of exercise!  Patient was oriented to gym and equipment including functions, settings, policies, and procedures.  Patient's individual exercise prescription and treatment plan were reviewed.  All starting workloads were established based on the results of the 6 minute walk test done at initial orientation visit.  The plan for exercise progression was also introduced and progression will be customized based on patient's performance and goals.          Exercise Goals and Review: Exercise Goals    Row Name 11/07/17 1520             Exercise Goals   Increase Physical Activity  Yes       Intervention  Provide advice, education, support and counseling about physical activity/exercise needs.;Develop an individualized exercise prescription for aerobic and resistive training based on initial evaluation findings, risk stratification, comorbidities and participant's personal goals.       Expected Outcomes  Short Term: Attend rehab on a regular basis to increase amount of physical activity.;Long Term: Add in home exercise to make exercise part of routine and to increase amount of physical activity.;Long Term: Exercising regularly at least 3-5 days a week.       Increase Strength and Stamina   Yes       Intervention  Provide advice, education, support and counseling about physical activity/exercise needs.;Develop an individualized exercise prescription for aerobic and resistive training based on initial evaluation findings, risk stratification, comorbidities and participant's personal goals.       Expected Outcomes  Short Term: Increase workloads from initial exercise prescription for resistance, speed, and METs.;Short Term: Perform resistance training exercises routinely during rehab and add in resistance training at home;Long Term: Improve cardiorespiratory fitness, muscular endurance and strength as measured by increased METs and functional capacity (6MWT)       Able to understand and use rate of perceived exertion (RPE) scale  Yes       Intervention  Provide education and explanation on how to use RPE scale       Expected Outcomes  Short Term: Able to use RPE daily in rehab to express subjective intensity level;Long Term:  Able to use RPE to guide intensity level when exercising independently       Able to understand and use Dyspnea scale  Yes       Intervention  Provide education and explanation on how to use Dyspnea scale  Expected Outcomes  Short Term: Able to use Dyspnea scale daily in rehab to express subjective sense of shortness of breath during exertion;Long Term: Able to use Dyspnea scale to guide intensity level when exercising independently       Knowledge and understanding of Target Heart Rate Range (THRR)  Yes       Intervention  Provide education and explanation of THRR including how the numbers were predicted and where they are located for reference       Expected Outcomes  Short Term: Able to state/look up THRR;Short Term: Able to use daily as guideline for intensity in rehab;Long Term: Able to use THRR to govern intensity when exercising independently       Able to check pulse independently  Yes       Intervention  Provide education and demonstration on how to check  pulse in carotid and radial arteries.;Review the importance of being able to check your own pulse for safety during independent exercise       Expected Outcomes  Short Term: Able to explain why pulse checking is important during independent exercise;Long Term: Able to check pulse independently and accurately       Understanding of Exercise Prescription  Yes       Intervention  Provide education, explanation, and written materials on patient's individual exercise prescription       Expected Outcomes  Short Term: Able to explain program exercise prescription;Long Term: Able to explain home exercise prescription to exercise independently          Exercise Goals Re-Evaluation : Exercise Goals Re-Evaluation    Row Name 11/09/17 1040 11/16/17 1024 11/16/17 1136 11/30/17 1246       Exercise Goal Re-Evaluation   Exercise Goals Review  Increase Physical Activity;Able to understand and use rate of perceived exertion (RPE) scale;Knowledge and understanding of Target Heart Rate Range (THRR);Increase Strength and Stamina;Able to understand and use Dyspnea scale  Increase Physical Activity;Able to understand and use rate of perceived exertion (RPE) scale;Knowledge and understanding of Target Heart Rate Range (THRR);Understanding of Exercise Prescription;Increase Strength and Stamina;Able to understand and use Dyspnea scale;Able to check pulse independently  Increase Physical Activity;Increase Strength and Stamina;Able to understand and use rate of perceived exertion (RPE) scale;Able to understand and use Dyspnea scale;Knowledge and understanding of Target Heart Rate Range (THRR);Able to check pulse independently  Increase Physical Activity;Increase Strength and Stamina;Able to understand and use rate of perceived exertion (RPE) scale;Able to understand and use Dyspnea scale    Comments  Reviewed RPE scale, THR and program prescription with pt today.  Pt voiced understanding and was given a copy of goals to take  home.   Reviewed home exercise with pt today.  Pt plans to walk at home for exercise.  He will re join [employer] after graduation.  Reviewed THR, pulse, RPE, sign and symptoms, NTG use, and when to call 911 or MD.  Also discussed weather considerations and indoor options.  Pt voiced understanding.  Amjad is in his second week of starting back to [place].    Amjad has not increased MET levels.  He is reaching RPE target goals.  Staff will monitor progress and encourage increased effort.    Expected Outcomes  Short: Use RPE daily to regulate intensity. Long: Follow program prescription in THR.  Short: Start by adding in at least one day of walking at home.  Long: Continue to work on increasing activity levels at home.   Short - attend LW 3  times per week Long - improve MET level  Short - increase intensity at current levels Long - improve MET level       Discharge Exercise Prescription (Final Exercise Prescription Changes): Exercise Prescription Changes - 11/30/17 1200      Response to Exercise   Blood Pressure (Admit)  130/64    Blood Pressure (Exit)  128/56    Heart Rate (Admit)  72 bpm    Heart Rate (Exercise)  81 bpm    Heart Rate (Exit)  80 bpm    Oxygen Saturation (Admit)  96 %    Oxygen Saturation (Exercise)  87 %    Oxygen Saturation (Exit)  91 %    Rating of Perceived Exertion (Exercise)  12    Perceived Dyspnea (Exercise)  2    Symptoms  none    Duration  Continue with 45 min of aerobic exercise without signs/symptoms of physical distress.    Intensity  THRR unchanged      Progression   Progression  Continue to progress workloads to maintain intensity without signs/symptoms of physical distress.    Average METs  1.6      Resistance Training   Training Prescription  Yes    Weight  3 lb    Reps  10-15      Interval Training   Interval Training  No      Oxygen   Oxygen  Continuous    Liters  6      NuStep   Level  2    SPM  80    Minutes  15    METs  1.8      Arm  Ergometer   Level  1    RPM  40    Minutes  15    METs  1.2      Home Exercise Plan   Plans to continue exercise at  Home (comment)   walking   Frequency  Add 1 additional day to program exercise sessions.    Initial Home Exercises Provided  11/16/17       Nutrition:  Target Goals: Understanding of nutrition guidelines, daily intake of sodium '1500mg'$ , cholesterol '200mg'$ , calories 30% from fat and 7% or less from saturated fats, daily to have 5 or more servings of fruits and vegetables.  Biometrics: Pre Biometrics - 11/07/17 1518      Pre Biometrics   Height  5' 5.5" (1.664 m)    Weight  150 lb 11.2 oz (68.4 kg)    Waist Circumference  37.5 inches    Hip Circumference  38 inches    Waist to Hip Ratio  0.99 %    BMI (Calculated)  24.69    Single Leg Stand  27.7 seconds        Nutrition Therapy Plan and Nutrition Goals: Nutrition Therapy & Goals - 11/09/17 1130      Nutrition Therapy   Diet  TLC    Protein (specify units)  7-8oz    Fiber  30 grams    Whole Grain Foods  3 servings    Saturated Fats  13 max. grams    Fruits and Vegetables  5 servings/day   8 ideal. eats fruits and vegetables regularly but eats small portions   Sodium  1500 grams      Personal Nutrition Goals   Nutrition Goal  Continue to drink your daily protein shake, great job. Consider increasing to 2/day if you start to have trouble maintaining CBW  Personal Goal #2  Drink a glass of milk each day, ideally in the afternoon between lunch and dinner, or in the evening    Personal Goal #3  Try to eat snacks consistently between meals, which will help you meet your calorie needs d/t only eaing small portions at meals. Snacks that contain fat are typically more calorie-dense and so are optimal choices    Comments  He has lost a few pounds since his last round of classes in this department. His appetite continues to be poor and he c.o altered taste of foods. He has started to put more focus on consuming  protein sources IE more meat and will snack occasionally between meals      Intervention Plan   Intervention  Prescribe, educate and counsel regarding individualized specific dietary modifications aiming towards targeted core components such as weight, hypertension, lipid management, diabetes, heart failure and other comorbidities.    Expected Outcomes  Short Term Goal: Understand basic principles of dietary content, such as calories, fat, sodium, cholesterol and nutrients.;Short Term Goal: A plan has been developed with personal nutrition goals set during dietitian appointment.;Long Term Goal: Adherence to prescribed nutrition plan.       Nutrition Assessments: Nutrition Assessments - 11/07/17 1534      MEDFICTS Scores   Pre Score  26       Nutrition Goals Re-Evaluation: Nutrition Goals Re-Evaluation    [place] Name 11/09/17 1138             Goals   Nutrition Goal  Continue to drink your daily protein shake, great job. Consider increasing to 2/day if you start to have trouble maintaining CBW       Comment  He has started to drink a protein drink daily, which he was not doing in the past, in order to better meet his daily protein requirements       Expected Outcome  He will drink 1-2 protien shakes daily consistently         Personal Goal #2 Re-Evaluation   Personal Goal #2  Drink a glass of milk each day, ideally in the afternoon between lunch and dinner, or in the evening         Personal Goal #3 Re-Evaluation   Personal Goal #3  Try to eat snacks consistently between meals, which will help you meet your calorie needs d/t only eating small portions at meals. Snacks that contain fat are typically more calorie dense and so are optimal choices          Nutrition Goals Discharge (Final Nutrition Goals Re-Evaluation): Nutrition Goals Re-Evaluation - 11/09/17 1138      Goals   Nutrition Goal  Continue to drink your daily protein shake, great job. Consider increasing to 2/day if you  start to have trouble maintaining CBW    Comment  He has started to drink a protein drink daily, which he was not doing in the past, in order to better meet his daily protein requirements    Expected Outcome  He will drink 1-2 protien shakes daily consistently      Personal Goal #2 Re-Evaluation   Personal Goal #2  Drink a glass of milk each day, ideally in the afternoon between lunch and dinner, or in the evening      Personal Goal #3 Re-Evaluation   Personal Goal #3  Try to eat snacks consistently between meals, which will help you meet your calorie needs d/t only eating small portions at meals. Snacks that  contain fat are typically more calorie dense and so are optimal choices       Psychosocial: Target Goals: Acknowledge presence or absence of significant depression and/or stress, maximize coping skills, provide positive support system. Participant is able to verbalize types and ability to use techniques and skills needed for reducing stress and depression.   Initial Review & Psychosocial Screening: Initial Psych Review & Screening - 11/07/17 1457      Initial Review   Current issues with  Current Depression;Current Stress Concerns    Source of Stress Concerns  Chronic Illness;Unable to perform yard/household activities      Gates?  Yes    Comments  He can look to his wife and children for support.      Barriers   Psychosocial barriers to participate in program  The patient should benefit from training in stress management and relaxation.      Screening Interventions   Interventions  Program counselor consult;Provide feedback about the scores to participant;To provide support and resources with identified psychosocial needs;Encouraged to exercise    Expected Outcomes  Short Term goal: Utilizing psychosocial counselor, staff and physician to assist with identification of specific Stressors or current issues interfering with healing process. Setting  desired goal for each stressor or current issue identified.;Long Term Goal: Stressors or current issues are controlled or eliminated.;Short Term goal: Identification and review with participant of any Quality of Life or Depression concerns found by scoring the questionnaire.;Long Term goal: The participant improves quality of Life and PHQ9 Scores as seen by post scores and/or verbalization of changes       Quality of Life Scores:  Scores of 19 and below usually indicate a poorer quality of life in these areas.  A difference of  2-3 points is a clinically meaningful difference.  A difference of 2-3 points in the total score of the Quality of Life Index has been associated with significant improvement in overall quality of life, self-image, physical symptoms, and general health in studies assessing change in quality of life.  PHQ-9: Recent Review Flowsheet Data    Depression screen [facility] 2/9 11/07/2017 10/13/2017 06/27/2017 01/26/2017 01/24/2017   Decreased Interest 0 0 0 1 1   Down, Depressed, Hopeless 0 0 0 1 1   PHQ - 2 Score 0 0 0 2 2   Altered sleeping 2 - - 0 3   Tired, decreased energy 2 - - 1 1   Change in appetite 1 - - 2 3   Feeling bad or failure about yourself  0 - - 0 3   Trouble concentrating 0 - - 0 0   Moving slowly or fidgety/restless 0 - - 0 0   Suicidal thoughts 0 - - 0 0   PHQ-9 Score 5 - - 5 12   Difficult doing work/chores Somewhat difficult - - Somewhat difficult Somewhat difficult     Interpretation of Total Score  Total Score Depression Severity:  1-4 = Minimal depression, 5-9 = Mild depression, 10-14 = Moderate depression, 15-19 = Moderately severe depression, 20-27 = Severe depression   Psychosocial Evaluation and Intervention: Psychosocial Evaluation - 11/14/17 1122      Psychosocial Evaluation & Interventions   Interventions  Stress management education;Encouraged to exercise with the program and follow exercise prescription    Comments  Mr. Longshore has returned to  this program after approximately one year due to continued issues with his heart and his breathing.  He is an  84 year old who continues to have a strong support system and reports sleeping well and an improved appetite.  Mr. Spickler denies a history of depression or anxiety or any current symptoms and is typically in a positive mood.  He has current stress with his health and also he and spouse are keeping his 58 year old grandson while the parents are in [place].  He was happy to report this is only for a week!  He has goals to just improve his breathing and is already feeling stronger per his report since coming into this program.  Staff will follow with him.      Expected Outcomes  Short:  Mr. Marinaro will exercise consistently to continue his progress of improved breathing and for his stress reduction.      Long:  Mr. Dubey will continue exercising consistently for his health and his mental health.      Continue Psychosocial Services   Follow up required by staff       Psychosocial Re-Evaluation: Psychosocial Re-Evaluation    [place] Name 11/28/17 1648             Psychosocial Re-Evaluation   Current issues with  Current Stress Concerns       Comments  Patient states that the only stress he has is with his breathing and some properties that he is trying to deal with.       Expected Outcomes  Short: Attend [name] stress management education to decrease stress. Long: Maintain exercise Post [name] to keep stress at a minimum.       Interventions  Encouraged to attend Cardiac Rehabilitation for the exercise       Continue Psychosocial Services   Follow up required by staff          Psychosocial Discharge (Final Psychosocial Re-Evaluation): Psychosocial Re-Evaluation - 11/28/17 1648      Psychosocial Re-Evaluation   Current issues with  Current Stress Concerns    Comments  Patient states that the only stress he has is with his breathing and some properties that he is trying to deal with.     Expected Outcomes  Short: Attend [name] stress management education to decrease stress. Long: Maintain exercise Post [name] to keep stress at a minimum.    Interventions  Encouraged to attend Cardiac Rehabilitation for the exercise    Continue Psychosocial Services   Follow up required by staff       Education: Education Goals: Education classes will be provided on a weekly basis, covering required topics. Participant will state understanding/return demonstration of topics presented.  Learning Barriers/Preferences: Learning Barriers/Preferences - 11/07/17 1457      Learning Barriers/Preferences   Learning Barriers  Hearing   sometines he wears a hearing aid   Learning Preferences  Audio;Written Material       Education Topics:  Initial Evaluation Education: - Verbal, written and demonstration of respiratory meds, oximetry and breathing techniques. Instruction on use of nebulizers and MDIs and importance of monitoring MDI activations.   Pulmonary Rehab from 12/07/2017 in [facility] Cardiac and Pulmonary Rehab  Date  11/07/17  Educator  [facility]  Instruction Review Code  1- Verbalizes Understanding      General Nutrition Guidelines/Fats and Fiber: -Group instruction provided by verbal, written material, models and posters to present the general guidelines for heart healthy nutrition. Gives an explanation and review of dietary fats and fiber.   Pulmonary Rehab from 12/07/2017 in [facility] Cardiac and Pulmonary Rehab  Date  11/30/17  Educator  [facility]  Instruction Review Code  1- Verbalizes Understanding      Controlling Sodium/Reading Food Labels: -Group verbal and written material supporting the discussion of sodium use in heart healthy nutrition. Review and explanation with models, verbal and written materials for utilization of the food label.   Pulmonary Rehab from 12/07/2017 in [facility] Cardiac and Pulmonary Rehab  Date  12/07/17  Educator  LB  Instruction Review Code  1- Verbalizes  Understanding      Exercise Physiology & General Exercise Guidelines: - Group verbal and written instruction with models to review the exercise physiology of the cardiovascular system and associated critical values. Provides general exercise guidelines with specific guidelines to those with heart or lung disease.    Cardiac Rehab from 05/04/2017 in [facility] Cardiac and Pulmonary Rehab  Date  03/16/17  Educator  [facility]  Instruction Review Code  1- Verbalizes Understanding      Aerobic Exercise & Resistance Training: - Gives group verbal and written instruction on the various components of exercise. Focuses on aerobic and resistive training programs and the benefits of this training and how to safely progress through these programs.   Cardiac Rehab from 05/04/2017 in [facility] Cardiac and Pulmonary Rehab  Date  03/23/17  Educator  [facility]  Instruction Review Code  1- Verbalizes Understanding      Flexibility, Balance, Mind/Body Relaxation: Provides group verbal/written instruction on the benefits of flexibility and balance training, including mind/body exercise modes such as yoga, pilates and tai chi.  Demonstration and skill practice provided.   Pulmonary Rehab from 07/28/2015 in [facility] Cardiac and Pulmonary Rehab  Date  05/07/15  Educator  [facility]  Instruction Review Code (retired)  2- meets goals/outcomes      Stress and Anxiety: - Provides group verbal and written instruction about the health risks of elevated stress and causes of high stress.  Discuss the correlation between heart/lung disease and anxiety and treatment options. Review healthy ways to manage with stress and anxiety.   Cardiac Rehab from 05/04/2017 in [facility] Cardiac and Pulmonary Rehab  Date  04/13/17  Educator  [facility]  Instruction Review Code  1- Verbalizes Understanding      Depression: - Provides group verbal and written instruction on the correlation between heart/lung disease and depressed mood, treatment options, and the stigmas  associated with seeking treatment.   Exercise & Equipment Safety: - Individual verbal instruction and demonstration of equipment use and safety with use of the equipment.   Pulmonary Rehab from 12/07/2017 in [facility] Cardiac and Pulmonary Rehab  Date  11/07/17  Educator  [facility]  Instruction Review Code  1- Verbalizes Understanding      Infection Prevention: - Provides verbal and written material to individual with discussion of infection control including proper hand washing and proper equipment cleaning during exercise session.   Pulmonary Rehab from 12/07/2017 in [facility] Cardiac and Pulmonary Rehab  Date  11/07/17  Educator  [facility]  Instruction Review Code  1- Verbalizes Understanding      Falls Prevention: - Provides verbal and written material to individual with discussion of falls prevention and safety.   Pulmonary Rehab from 12/07/2017 in [facility] Cardiac and Pulmonary Rehab  Date  11/07/17  Educator  [facility]  Instruction Review Code  1- Verbalizes Understanding      Diabetes: - Individual verbal and written instruction to review signs/symptoms of diabetes, desired ranges of glucose level fasting, after meals and with exercise. Advice that pre and post exercise glucose checks will be done for  3 sessions at entry of program.   Chronic Lung Diseases: - Group verbal and written instruction to review updates, respiratory medications, advancements in procedures and treatments. Discuss use of supplemental oxygen including available portable oxygen systems, continuous and intermittent flow rates, concentrators, personal use and safety guidelines. Review proper use of inhaler and spacers. Provide informative websites for self-education.    Pulmonary Rehab from 12/07/2017 in [facility] Cardiac and Pulmonary Rehab  Date  12/02/17  Educator  [facility]  Instruction Review Code  5- Refused Teaching Neoma Laming had]      Energy Conservation: - Provide group verbal and written instruction for methods to conserve energy, plan  and organize activities. Instruct on pacing techniques, use of adaptive equipment and posture/positioning to relieve shortness of breath.   Pulmonary Rehab from 12/07/2017 in [facility] Cardiac and Pulmonary Rehab  Date  11/09/17  Educator  [facility]  Instruction Review Code  1- Verbalizes Understanding      Triggers and Exacerbations: - Group verbal and written instruction to review types of environmental triggers and ways to prevent exacerbations. Discuss weather changes, air quality and the benefits of nasal washing. Review warning signs and symptoms to help prevent infections. Discuss techniques for effective airway clearance, coughing, and vibrations.   AED/CPR: - Group verbal and written instruction with the use of models to demonstrate the basic use of the AED with the basic ABC's of resuscitation.   Pulmonary Rehab from 07/28/2015 in [facility] Cardiac and Pulmonary Rehab  Date  05/23/15  Educator  CE  Instruction Review Code (retired)  2- Lawyer and Physiology of the Lungs: - Group verbal and written instruction with the use of models to provide basic lung anatomy and physiology related to function, structure and complications of lung disease.   Pulmonary Rehab from 12/07/2017 in [facility] Cardiac and Pulmonary Rehab  Date  11/16/17  Educator  [facility]  Instruction Review Code  1- Verbalizes Understanding      Anatomy & Physiology of the Heart: - Group verbal and written instruction and models provide basic cardiac anatomy and physiology, with the coronary electrical and arterial systems. Review of Valvular disease and Heart Failure   Cardiac Rehab from 05/04/2017 in [facility] Cardiac and Pulmonary Rehab  Date  01/31/17  Educator  CE  Instruction Review Code  1- Verbalizes Understanding      Cardiac Medications: - Group verbal and written instruction to review commonly prescribed medications for heart disease. Reviews the medication, class of the drug, and side effects.   Cardiac  Rehab from 05/04/2017 in [facility] Cardiac and Pulmonary Rehab  Date  02/14/17  Educator  CE  Instruction Review Code  1- Verbalizes Understanding      Know Your Numbers and Risk Factors: -Group verbal and written instruction about important numbers in your health.  Discussion of what are risk factors and how they play a role in the disease process.  Review of Cholesterol, Blood Pressure, Diabetes, and BMI and the role they play in your overall health.   Pulmonary Rehab from 01/10/2017 in [facility] Cardiac and Pulmonary Rehab  Date  12/31/16  Educator  [facility]  Instruction Review Code  1- Verbalizes Understanding      Sleep Hygiene: -Provides group verbal and written instruction about how sleep can affect your health.  Define sleep hygiene, discuss sleep cycles and impact of sleep habits. Review good sleep hygiene tips.    Cardiac Rehab from 05/04/2017 in [facility] Cardiac and Pulmonary Rehab  Date  04/27/17  Educator  [place]  Instruction Review Code  1- Verbalizes Understanding      Other: -Provides group and verbal instruction on various topics (see comments)    Knowledge Questionnaire Score: Knowledge Questionnaire Score - 11/07/17 1506      Knowledge Questionnaire Score   Pre Score  14/18   reviewed with patient       Core Components/Risk Factors/Patient Goals at Admission: Personal Goals and Risk Factors at Admission - 11/07/17 1509      Core Components/Risk Factors/Patient Goals on Admission    Weight Management  Yes;Weight Maintenance    Intervention  Weight Management: Develop a combined nutrition and exercise program designed to reach desired caloric intake, while maintaining appropriate intake of nutrient and fiber, sodium and fats, and appropriate energy expenditure required for the weight goal.;Weight Management: Provide education and appropriate resources to help participant work on and attain dietary goals.;Weight Management/Obesity: Establish reasonable short term and long term  weight goals.    Admit Weight  150 lb 11.2 oz (68.4 kg)    Goal Weight: Short Term  150 lb (68 kg)    Goal Weight: Long Term  150 lb (68 kg)    Expected Outcomes  Weight Maintenance: Understanding of the daily nutrition guidelines, which includes 25-35% calories from fat, 7% or less cal from saturated fats, less than '200mg'$  cholesterol, less than 1.5gm of sodium, & 5 or more servings of fruits and vegetables daily;Long Term: Adherence to nutrition and physical activity/exercise program aimed toward attainment of established weight goal;Short Term: Continue to assess and modify interventions until short term weight is achieved;Understanding recommendations for meals to include 15-35% energy as protein, 25-35% energy from fat, 35-60% energy from carbohydrates, less than '200mg'$  of dietary cholesterol, 20-35 gm of total fiber daily;Understanding of distribution of calorie intake throughout the day with the consumption of 4-5 meals/snacks    Improve shortness of breath with ADL's  Yes    Intervention  Provide education, individualized exercise plan and daily activity instruction to help decrease symptoms of SOB with activities of daily living.    Expected Outcomes  Short Term: Improve cardiorespiratory fitness to achieve a reduction of symptoms when performing ADLs;Long Term: Be able to perform more ADLs without symptoms or delay the onset of symptoms    Hypertension  Yes    Intervention  Provide education on lifestyle modifcations including regular physical activity/exercise, weight management, moderate sodium restriction and increased consumption of fresh fruit, vegetables, and low fat dairy, alcohol moderation, and smoking cessation.;Monitor prescription use compliance.    Expected Outcomes  Short Term: Continued assessment and intervention until BP is < 140/12m HG in hypertensive participants. < 130/825mHG in hypertensive participants with diabetes, heart failure or chronic kidney disease.;Long Term:  Maintenance of blood pressure at goal levels.    Lipids  Yes    Intervention  Provide education and support for participant on nutrition & aerobic/resistive exercise along with prescribed medications to achieve LDL '70mg'$ , HDL >'40mg'$ .    Expected Outcomes  Short Term: Participant states understanding of desired cholesterol values and is compliant with medications prescribed. Participant is following exercise prescription and nutrition guidelines.;Long Term: Cholesterol controlled with medications as prescribed, with individualized exercise RX and with personalized nutrition plan. Value goals: LDL < '70mg'$ , HDL > 40 mg.       Core Components/Risk Factors/Patient Goals Review:  Goals and Risk Factor Review    Row Name 11/28/17 1649  Core Components/Risk Factors/Patient Goals Review   Personal Goals Review  Hypertension;Improve shortness of breath with ADL's;Weight Management/Obesity;Lipids       Review  Patient states his lipids are under control. It is hard to tell if he is getting better at his ADL's since his family does alot for him. He does not cook or wash dishes. Sometime he feels like his shortness of breath is a little better at times. He states it is not worse though.       Expected Outcomes  Short: Attend [name] regularly to improve shortness of breath with ADL's. Long: maintain independence with ADL's           Core Components/Risk Factors/Patient Goals at Discharge (Final Review):  Goals and Risk Factor Review - 11/28/17 1649      Core Components/Risk Factors/Patient Goals Review   Personal Goals Review  Hypertension;Improve shortness of breath with ADL's;Weight Management/Obesity;Lipids    Review  Patient states his lipids are under control. It is hard to tell if he is getting better at his ADL's since his family does alot for him. He does not cook or wash dishes. Sometime he feels like his shortness of breath is a little better at times. He states it is not worse  though.    Expected Outcomes  Short: Attend [name] regularly to improve shortness of breath with ADL's. Long: maintain independence with ADL's        ITP Comments: ITP Comments    Row Name 11/07/17 1445 11/14/17 0808 12/12/17 0816       ITP Comments  Medical Evaluation completed. Chart sent for review and changes to Dr. Emily Filbert Director of [place]. Diagnosis can be found in [facility] encounter 11/01/17  30 day review completed. ITP sent to Dr. Emily Filbert Director of [place]. Continue with ITP unless changes are made by physician  30 day review completed. ITP sent to Dr. Emily Filbert Director of [place]. Continue with ITP unless changes are made by physician        Comments: 30 day review

## 2017-12-12 NOTE — Progress Notes (Signed)
Daily Session Note  Patient Details  Name: Vincent Kennedy MRN: [id number] Date of Birth: [birth year] Referring Provider:     Pulmonary Rehab from 11/07/2017 in [place] and Pulmonary Rehab  Referring Provider  Dorthula Perfect Date: 12/12/2017  Check In: Session Check In - 12/12/17 1017      Check-In   Supervising physician immediately available to respond to emergencies  [name] immediately available ER MD    Physician(s)  Dr. Jimmye Norman and Dr. Corky Downs    Location  [facility]-Cardiac & Pulmonary Rehab    Staff Present  Nada Maclachlan, BA, ACSM CEP, Exercise Physiologist;Shaquon Gropp Amedeo Plenty, BS, ACSM CEP, Exercise Physiologist;Joseph Tessie Fass RCP,RRT,BSRT    Medication changes reported      No    Fall or balance concerns reported     No    Tobacco Cessation  No Change    Warm-up and Cool-down  Performed as group-led instruction    Resistance Training Performed  Yes    VAD Patient?  No    PAD/SET Patient?  No      Pain Assessment   Currently in Pain?  No/denies    Multiple Pain Sites  No          Social History   Tobacco Use  Smoking Status Never Smoker  Smokeless Tobacco Never Used    Goals Met:  Proper associated with RPD/PD & O2 Sat Independence with exercise equipment Exercise tolerated well No report of cardiac concerns or symptoms Strength training completed today  Goals Unmet:  Not Applicable  Comments: Pt able to follow exercise prescription today without complaint.  Will continue to monitor for progression.    Dr. Emily Filbert is Medical Director for [place] and [name]monary [facility].

## 2017-12-14 DIAGNOSIS — I2721 Secondary pulmonary arterial hypertension: Secondary | ICD-10-CM

## 2017-12-14 DIAGNOSIS — J8410 Pulmonary fibrosis, unspecified: Secondary | ICD-10-CM

## 2017-12-14 NOTE — Progress Notes (Signed)
Daily Session Note  Patient Details  Name: Vincent Kennedy MRN: [id number] Date of Birth: 07/10/1932 Referring Provider:     Pulmonary Rehab from 11/07/2017 in [facility]  Referring Provider  Khan      Encounter Date: 12/14/2017  Check In: Session Check In - 12/14/17 1021      Check-In   Supervising physician immediately available to respond to emergencies  [name] immediately available ER MD    Physician(s)  Williams and Yao    Location  [facility]    Staff Present  Jessica Hawkins, MA, RCEP, CCRP, Exercise Physiologist;Joseph Hood RCP,RRT,BSRT; , BA, ACSM CEP, Exercise Physiologist    Medication changes reported      No    Fall or balance concerns reported     No    Warm-up and Cool-down  Performed as group-led instruction    Resistance Training Performed  Yes    VAD Patient?  No    PAD/SET Patient?  No      Pain Assessment   Currently in Pain?  No/denies    Multiple Pain Sites  No          Social History   Tobacco Use  Smoking Status Never Smoker  Smokeless Tobacco Never Used    Goals Met:  Proper associated with RPD/PD & O2 Sat Independence with exercise equipment Exercise tolerated well Strength training completed today  Goals Unmet:  Not Applicable  Comments: Pt able to follow exercise prescription today without complaint.  Will continue to monitor for progression.    Dr. Mark Miller is Medical Director for [facility] and [name]. 

## 2017-12-16 DIAGNOSIS — J9601 Acute respiratory failure with hypoxia: Secondary | ICD-10-CM | POA: Diagnosis not present

## 2017-12-16 DIAGNOSIS — I2721 Secondary pulmonary arterial hypertension: Secondary | ICD-10-CM

## 2017-12-16 NOTE — Progress Notes (Signed)
Daily Session Note  Patient Details  Name: Vincent Kennedy MRN: [id number] Date of Birth: 08/11/1932 Referring Provider:     Pulmonary Rehab from 11/07/2017 in [facility]  Referring Provider  Dorthula Perfect Date: 12/16/2017  Check In: Session Check In - 12/16/17 1004      Check-In   Supervising physician immediately available to respond to emergencies  [name] immediately available ER MD    Physician(s)  Dr. Corky Downs and [facility]    Location  [facility]    Staff Present  Justin Mend RCP,RRT,BSRT;Meredith Sherryll Burger, RN Vickki Hearing, BA, ACSM CEP, Exercise Physiologist    Medication changes reported      No    Fall or balance concerns reported     No    Warm-up and Cool-down  Performed as group-led instruction    Resistance Training Performed  Yes    VAD Patient?  No    PAD/SET Patient?  No      Pain Assessment   Currently in Pain?  No/denies          Social History   Tobacco Use  Smoking Status Never Smoker  Smokeless Tobacco Never Used    Goals Met:  Independence with exercise equipment Exercise tolerated well No report of cardiac concerns or symptoms Strength training completed today  Goals Unmet:  Not Applicable  Comments: Pt able to follow exercise prescription today without complaint.  Will continue to monitor for progression.    Dr. Emily Filbert is Medical Director for [facility] and [name].

## 2017-12-21 ENCOUNTER — Encounter: Payer: Medicare Other | Attending: Internal Medicine

## 2017-12-21 DIAGNOSIS — I2721 Secondary pulmonary arterial hypertension: Secondary | ICD-10-CM | POA: Insufficient documentation

## 2017-12-22 ENCOUNTER — Telehealth: Payer: Self-pay

## 2017-12-22 DIAGNOSIS — I2721 Secondary pulmonary arterial hypertension: Secondary | ICD-10-CM

## 2017-12-22 NOTE — Telephone Encounter (Signed)
Patient called earlier to say he would not be back to [name] until after October 20th. Left patient a message for discharge and to call back with any questions.

## 2017-12-22 NOTE — Progress Notes (Signed)
Discharge Progress Report  Patient Details  Name: Vincent Kennedy MRN: [id number] Date of Birth: 1932-05-07 Referring Provider:     Pulmonary Rehab from 11/07/2017 in [facility]  Referring Provider  Welton Flakes       Number of Visits: 15/36  Reason for Discharge:  Early Exit:  Personal  Smoking History:  Social History   Tobacco Use  Smoking Status Never Smoker  Smokeless Tobacco Never Used    Diagnosis:  Pulmonary artery hypertension (HCC)  ADL UCSD: Pulmonary Assessment Scores    Row Name 11/07/17 1448         ADL UCSD   ADL Phase  Entry     SOB Score total  25     Rest  0     Walk  1     Stairs  2     Bath  1     Dress  1     Shop  2       CAT Score   CAT Score  11       mMRC Score   mMRC Score  2        Initial Exercise Prescription: Initial Exercise Prescription - 11/07/17 1500      Date of Initial Exercise RX and Referring Provider   Date  11/07/17    Referring Provider  Welton Flakes      Treadmill   MPH  1.5    Grade  0    Minutes  15    METs  2.15      NuStep   Level  2    SPM  80    Minutes  15    METs  2      Arm Ergometer   Level  1    RPM  40    Minutes  15    METs  2      Prescription Details   Frequency (times per week)  3    Duration  Progress to 45 minutes of aerobic exercise without signs/symptoms of physical distress      Intensity   THRR 40-80% of Max Heartrate  93-122    Ratings of Perceived Exertion  11-13    Perceived Dyspnea  0-4      Resistance Training   Training Prescription  Yes    Weight  2 lb    Reps  10-15       Discharge Exercise Prescription (Final Exercise Prescription Changes): Exercise Prescription Changes - 12/14/17 1100      Response to Exercise   Blood Pressure (Admit)  128/58    Blood Pressure (Exit)  134/74    Heart Rate (Admit)  85 bpm    Heart Rate (Exercise)  91 bpm    Heart Rate (Exit)  74 bpm    Oxygen Saturation (Admit)  92 %    Oxygen Saturation (Exercise)  85 %     Oxygen Saturation (Exit)  89 %    Rating of Perceived Exertion (Exercise)  13    Perceived Dyspnea (Exercise)  2    Symptoms  none    Duration  Continue with 45 min of aerobic exercise without signs/symptoms of physical distress.    Intensity  THRR unchanged      Progression   Progression  Continue to progress workloads to maintain intensity without signs/symptoms of physical distress.    Average METs  1.85      Resistance Training   Training Prescription  Yes  Weight  3 lb    Reps  10-15      Interval Training   Interval Training  No      Oxygen   Oxygen  Continuous    Liters  6      Treadmill   MPH  1    Grade  0    Minutes  15    METs  1.77      NuStep   Level  2    SPM  80    Minutes  15    METs  1.9      Home Exercise Plan   Plans to continue exercise at  Home (comment)   walking   Frequency  Add 1 additional day to program exercise sessions.    Initial Home Exercises Provided  11/16/17       Functional Capacity: 6 Minute Walk    Row Name 11/07/17 1520         6 Minute Walk   Distance  542 feet     Walk Time  3.6 minutes     # of Rest Breaks  1     MPH  1.7     METS  2.3     RPE  13     Perceived Dyspnea   1     VO2 Peak  3.46     Symptoms  Yes (comment)     Comments  O2 dropped during test     Resting HR  64 bpm     Resting BP  104/58     Resting Oxygen Saturation   95 %     Exercise Oxygen Saturation  during 6 min walk  78 %     Max Ex. HR  97 bpm     Max Ex. BP  134/66     2 Minute Post BP  122/64       Interval HR   1 Minute HR  97     2 Minute HR  97     4 Minute HR  83     5 Minute HR  71     6 Minute HR  95     2 Minute Post HR  68     Interval Heart Rate?  Yes       Interval Oxygen   Interval Oxygen?  Yes     Baseline Oxygen Saturation %  95 %     1 Minute Oxygen Saturation %  89 %     1 Minute Liters of Oxygen  4 L pulsed     2 Minute Oxygen Saturation %  79 %     2 Minute Liters of Oxygen  4 L     3 Minute Liters  of Oxygen  4 L     4 Minute Oxygen Saturation %  78 %     4 Minute Liters of Oxygen  4 L     5 Minute Oxygen Saturation %  82 %     5 Minute Liters of Oxygen  4 L     6 Minute Oxygen Saturation %  88 %     6 Minute Liters of Oxygen  4 L     2 Minute Post Oxygen Saturation %  93 %     2 Minute Post Liters of Oxygen  4 L        Psychological, QOL, Others - Outcomes: PHQ 2/9: Depression screen [facility] 2/9 11/07/2017 10/13/2017 06/27/2017 01/26/2017 01/24/2017  Decreased Interest 0 0 0 1 1  Down, Depressed, Hopeless 0 0 0 1 1  PHQ - 2 Score 0 0 0 2 2  Altered sleeping 2 - - 0 3  Tired, decreased energy 2 - - 1 1  Change in appetite 1 - - 2 3  Feeling bad or failure about yourself  0 - - 0 3  Trouble concentrating 0 - - 0 0  Moving slowly or fidgety/restless 0 - - 0 0  Suicidal thoughts 0 - - 0 0  PHQ-9 Score 5 - - 5 12  Difficult doing work/chores Somewhat difficult - - Somewhat difficult Somewhat difficult    Quality of Life:   Personal Goals: Goals established at orientation with interventions provided to work toward goal. Personal Goals and Risk Factors at Admission - 11/07/17 1509      Core Components/Risk Factors/Patient Goals on Admission    Weight Management  Yes;Weight Maintenance    Intervention  Weight Management: Develop a combined nutrition and exercise program designed to reach desired caloric intake, while maintaining appropriate intake of nutrient and fiber, sodium and fats, and appropriate energy expenditure required for the weight goal.;Weight Management: Provide education and appropriate resources to help participant work on and attain dietary goals.;Weight Management/Obesity: Establish reasonable short term and long term weight goals.    Admit Weight  150 lb 11.2 oz (68.4 kg)    Goal Weight: Short Term  150 lb (68 kg)    Goal Weight: Long Term  150 lb (68 kg)    Expected Outcomes  Weight Maintenance: Understanding of the daily nutrition guidelines, which includes 25-35%  calories from fat, 7% or less cal from saturated fats, less than 200mg  cholesterol, less than 1.5gm of sodium, & 5 or more servings of fruits and vegetables daily;Long Term: Adherence to nutrition and physical activity/exercise program aimed toward attainment of established weight goal;Short Term: Continue to assess and modify interventions until short term weight is achieved;Understanding recommendations for meals to include 15-35% energy as protein, 25-35% energy from fat, 35-60% energy from carbohydrates, less than 200mg  of dietary cholesterol, 20-35 gm of total fiber daily;Understanding of distribution of calorie intake throughout the day with the consumption of 4-5 meals/snacks    Improve shortness of breath with ADL's  Yes    Intervention  Provide education, individualized exercise plan and daily activity instruction to help decrease symptoms of SOB with activities of daily living.    Expected Outcomes  Short Term: Improve cardiorespiratory fitness to achieve a reduction of symptoms when performing ADLs;Long Term: Be able to perform more ADLs without symptoms or delay the onset of symptoms    Hypertension  Yes    Intervention  Provide education on lifestyle modifcations including regular physical activity/exercise, weight management, moderate sodium restriction and increased consumption of fresh fruit, vegetables, and low fat dairy, alcohol moderation, and smoking cessation.;Monitor prescription use compliance.    Expected Outcomes  Short Term: Continued assessment and intervention until BP is < 140/42mm HG in hypertensive participants. < 130/35mm HG in hypertensive participants with diabetes, heart failure or chronic kidney disease.;Long Term: Maintenance of blood pressure at goal levels.    Lipids  Yes    Intervention  Provide education and support for participant on nutrition & aerobic/resistive exercise along with prescribed medications to achieve LDL 70mg , HDL >40mg .    Expected Outcomes  Short  Term: Participant states understanding of desired cholesterol values and is compliant with medications prescribed. Participant is following exercise prescription and nutrition guidelines.;Long  Term: Cholesterol controlled with medications as prescribed, with individualized exercise RX and with personalized nutrition plan. Value goals: LDL < 70mg , HDL > 40 mg.        Personal Goals Discharge: Goals and Risk Factor Review    Row Name 11/28/17 1649             Core Components/Risk Factors/Patient Goals Review   Personal Goals Review  Hypertension;Improve shortness of breath with ADL's;Weight Management/Obesity;Lipids       Review  Patient states his lipids are under control. It is hard to tell if he is getting better at his ADL's since his family does alot for him. He does not cook or wash dishes. Sometime he feels like his shortness of breath is a little better at times. He states it is not worse though.       Expected Outcomes  Short: Attend [name] regularly to improve shortness of breath with ADL's. Long: maintain independence with ADL's           Exercise Goals and Review: Exercise Goals    Row Name 11/07/17 1520             Exercise Goals   Increase Physical Activity  Yes       Intervention  Provide advice, education, support and counseling about physical activity/exercise needs.;Develop an individualized exercise prescription for aerobic and resistive training based on initial evaluation findings, risk stratification, comorbidities and participant's personal goals.       Expected Outcomes  Short Term: Attend rehab on a regular basis to increase amount of physical activity.;Long Term: Add in home exercise to make exercise part of routine and to increase amount of physical activity.;Long Term: Exercising regularly at least 3-5 days a week.       Increase Strength and Stamina  Yes       Intervention  Provide advice, education, support and counseling about physical activity/exercise  needs.;Develop an individualized exercise prescription for aerobic and resistive training based on initial evaluation findings, risk stratification, comorbidities and participant's personal goals.       Expected Outcomes  Short Term: Increase workloads from initial exercise prescription for resistance, speed, and METs.;Short Term: Perform resistance training exercises routinely during rehab and add in resistance training at home;Long Term: Improve cardiorespiratory fitness, muscular endurance and strength as measured by increased METs and functional capacity ( )       Able to understand and use rate of perceived exertion (RPE) scale  Yes       Intervention  Provide education and explanation on how to use RPE scale       Expected Outcomes  Short Term: Able to use RPE daily in rehab to express subjective intensity level;Long Term:  Able to use RPE to guide intensity level when exercising independently       Able to understand and use Dyspnea scale  Yes       Intervention  Provide education and explanation on how to use Dyspnea scale       Expected Outcomes  Short Term: Able to use Dyspnea scale daily in rehab to express subjective sense of shortness of breath during exertion;Long Term: Able to use Dyspnea scale to guide intensity level when exercising independently       Knowledge and understanding of Target Heart Rate Range (THRR)  Yes       Intervention  Provide education and explanation of THRR including how the numbers were predicted and where they are located for reference  Expected Outcomes  Short Term: Able to state/look up THRR;Short Term: Able to use daily as guideline for intensity in rehab;Long Term: Able to use THRR to govern intensity when exercising independently       Able to check pulse independently  Yes       Intervention  Provide education and demonstration on how to check pulse in carotid and radial arteries.;Review the importance of being able to check your own pulse for safety  during independent exercise       Expected Outcomes  Short Term: Able to explain why pulse checking is important during independent exercise;Long Term: Able to check pulse independently and accurately       Understanding of Exercise Prescription  Yes       Intervention  Provide education, explanation, and written materials on patient's individual exercise prescription       Expected Outcomes  Short Term: Able to explain program exercise prescription;Long Term: Able to explain home exercise prescription to exercise independently          Nutrition & Weight - Outcomes: Pre Biometrics - 11/07/17 1518      Pre Biometrics   Height  5' 5.5" (1.664 m)    Weight  150 lb 11.2 oz (68.4 kg)    Waist Circumference  37.5 inches    Hip Circumference  38 inches    Waist to Hip Ratio  0.99 %    BMI (Calculated)  24.69    Single Leg Stand  27.7 seconds        Nutrition: Nutrition Therapy & Goals - 11/09/17 1130      Nutrition Therapy   Diet  TLC    Protein (specify units)  7-8oz    Fiber  30 grams    Whole Grain Foods  3 servings    Saturated Fats  13 max. grams    Fruits and Vegetables  5 servings/day   8 ideal. eats fruits and vegetables regularly but eats small portions   Sodium  1500 grams      Personal Nutrition Goals   Nutrition Goal  Continue to drink your daily protein shake, great job. Consider increasing to 2/day if you start to have trouble maintaining CBW    Personal Goal #2  Drink a glass of milk each day, ideally in the afternoon between lunch and dinner, or in the evening    Personal Goal #3  Try to eat snacks consistently between meals, which will help you meet your calorie needs d/t only eaing small portions at meals. Snacks that contain fat are typically more calorie-dense and so are optimal choices    Comments  He has lost a few pounds since his last round of classes in this department. His appetite continues to be poor and he c.o altered taste of foods. He has started to  put more focus on consuming protein sources IE more meat and will snack occasionally between meals      Intervention Plan   Intervention  Prescribe, educate and counsel regarding individualized specific dietary modifications aiming towards targeted core components such as weight, hypertension, lipid management, diabetes, heart failure and other comorbidities.    Expected Outcomes  Short Term Goal: Understand basic principles of dietary content, such as calories, fat, sodium, cholesterol and nutrients.;Short Term Goal: A plan has been developed with personal nutrition goals set during dietitian appointment.;Long Term Goal: Adherence to prescribed nutrition plan.       Nutrition Discharge: Nutrition Assessments - 11/07/17 1534  MEDFICTS Scores   Pre Score  26       Education Questionnaire Score: Knowledge Questionnaire Score - 11/07/17 1506      Knowledge Questionnaire Score   Pre Score  14/18   reviewed with patient      Goals reviewed with patient; copy given to patient.

## 2017-12-22 NOTE — Progress Notes (Signed)
Pulmonary Individual Treatment Plan  Patient Details  Name: Vincent Kennedy MRN: [id number] Date of Birth: [birth year] Referring Provider:     Pulmonary Rehab from 11/07/2017 in [facility]  Referring Provider  [name]      Initial Encounter Date:    Pulmonary Rehab from 11/07/2017 in [facility]  Date  11/07/17      Visit Diagnosis: Pulmonary artery hypertension ([place])  Patient's Home Medications on Admission:  Current Outpatient Medications:  .  aspirin EC 81 MG tablet, Take 1 tablet (81 mg total) by mouth daily., Disp: 30 tablet, Rfl: 6 .  budesonide (PULMICORT) 0.5 MG/2ML nebulizer solution, Take 2 mLs (0.5 mg total) by nebulization 2 (two) times daily., Disp: 260 mL, Rfl: 3 .  clopidogrel (PLAVIX) 75 MG tablet, Take 1 tablet (75 mg total) by mouth daily., Disp: 90 tablet, Rfl: 3 .  finasteride (PROSCAR) 5 MG tablet, Take 1 tablet (5 mg total) by mouth daily., Disp: 90 tablet, Rfl: 3 .  metoprolol tartrate (LOPRESSOR) 25 MG tablet, Take 1 tablet (25 mg total) by mouth 2 (two) times daily., Disp: 180 tablet, Rfl: 3 .  mometasone-formoterol (DULERA) 100-5 MCG/ACT AERO, Inhale 2 puffs into the lungs 2 (two) times daily., Disp: 1 Inhaler, Rfl: 1 .  nitroGLYCERIN (NITROSTAT) 0.4 MG SL tablet, Place 1 tablet (0.4 mg total) under the tongue every 5 (five) minutes as needed for chest pain., Disp: 25 tablet, Rfl: 12 .  pantoprazole (PROTONIX) 40 MG tablet, Take 1 tablet (40 mg total) by mouth daily. (Patient taking differently: Take 40 mg by mouth daily as needed (heartburn). ), Disp: 30 tablet, Rfl: 6 .  Pirfenidone (ESBRIET) 267 MG CAPS, Take 801 mg by mouth 3 (three) times daily. , Disp: , Rfl:  .  rivastigmine (EXELON) 9.5 mg/24hr, Place 1 patch (9.5 mg total) onto the skin daily., Disp: 90 patch, Rfl: 2 .  rosuvastatin (CRESTOR) 40 MG tablet, Take 40 mg by mouth every day, Disp: 90 tablet, Rfl: 3 .  zolpidem (AMBIEN) 10 MG tablet, TAKE 1 TABLET BY  MOUTH AT  BEDTIME AS NEEDED, Disp: 90 tablet, Rfl: 2  Past Medical History: Past Medical History:  Diagnosis Date  . Abnormal nuclear cardiac imaging test    Dr. Fletcher Anon  . BPH (benign prostatic hyperplasia)   . Coronary artery disease    Cardiac cath September 2008: 20% left main stenosis, 40% mid LAD stenosis, 99% ostial first diagonal stenosis in a large branch and 90% ostial disease in second diagonal but the vessel was very small, 20% mid RCA stenosis with normal ejection fraction. Successful angioplasty and drug-eluting stent placement to the ostial first diagonal with a 2.5 x 15 mm Xience stent  . GERD (gastroesophageal reflux disease)   . Hyperlipidemia   . Hypertension    BP has never been high(per pt).  Metoprolol is to keep BP low because of stent (afterload)  . On home oxygen therapy    "2-3L; 24/7" (01/19/2017)  . OSA on CPAP   . Oxygen deficiency   . Pneumonia 07/2016  . Pulmonary fibrosis ([place]) 08/26/2015  . Shortness of breath dyspnea    with exertion  . Wears hearing aid    (sometimes)    Tobacco Use: Social History   Tobacco Use  Smoking Status Never Smoker  Smokeless Tobacco Never Used    Labs: Recent Review Scientist, physiological    Labs for ITP Cardiac and Pulmonary Rehab Latest Ref Rng & Units 01/12/2017  01/12/2017 01/12/2017 01/12/2017 03/11/2017   Cholestrol 100 - 199 mg/dL - - - - 131   LDLCALC 0 - 99 mg/dL - - - - 73   HDL >39 mg/dL - - - - 38(L)   Trlycerides 0 - 149 mg/dL - - - - 99   PHART 7.350 - 7.450 7.378 - - - -   PCO2ART 32.0 - 48.0 mmHg 41.3 - - - -   HCO3 20.0 - 28.0 mmol/L 24.3 23.8 24.1 25.3 -   TCO2 22 - 32 mmol/L _0 -   ACIDBASEDEF 0.0 - 2.0 mmol/L 1.0 2.0 1.0 1.0 -   O2SAT % 98.0 77.0 76.0 64.0 -       Pulmonary Assessment Scores: Pulmonary Assessment Scores    Row Name 11/07/17 1448         ADL UCSD   ADL Phase  Entry     SOB Score total  25     Rest  0     Walk  1     Stairs  2     Bath  1     Dress  1     Shop   2       CAT Score   CAT Score  11       mMRC Score   mMRC Score  2        Pulmonary Function Assessment: Pulmonary Function Assessment - 11/07/17 1509      Breath   Bilateral Breath Sounds  Clear;Decreased    Shortness of Breath  Yes;Fear of Shortness of Breath;Limiting activity       Exercise Target Goals: Exercise Program Goal: Individual exercise prescription set using results from initial 6 min walk test and THRR while considering  patient's activity barriers and safety.   Exercise Prescription Goal: Initial exercise prescription builds to 30-45 minutes a day of aerobic activity, 2-3 days per week.  Home exercise guidelines will be given to patient during program as part of exercise prescription that the participant will acknowledge.  Activity Barriers & Risk Stratification:   6 Minute Walk: 6 Minute Walk    Row Name 11/07/17 1520         6 Minute Walk   Distance  542 feet     Walk Time  3.6 minutes     # of Rest Breaks  1     MPH  1.7     METS  2.3     RPE  13     Perceived Dyspnea   1     VO2 Peak  3.46     Symptoms  Yes (comment)     Comments  O2 dropped during test     Resting HR  64 bpm     Resting BP  104/58     Resting Oxygen Saturation   95 %     Exercise Oxygen Saturation  during 6 min walk  78 %     Max Ex. HR  97 bpm     Max Ex. BP  134/66     2 Minute Post BP  122/64       Interval HR   1 Minute HR  97     2 Minute HR  97     4 Minute HR  83     5 Minute HR  71     6 Minute HR  95     2 Minute Post HR  68  Interval Heart Rate?  Yes       Interval Oxygen   Interval Oxygen?  Yes     Baseline Oxygen Saturation %  95 %     1 Minute Oxygen Saturation %  89 %     1 Minute Liters of Oxygen  4 L pulsed     2 Minute Oxygen Saturation %  79 %     2 Minute Liters of Oxygen  4 L     3 Minute Liters of Oxygen  4 L     4 Minute Oxygen Saturation %  78 %     4 Minute Liters of Oxygen  4 L     5 Minute Oxygen Saturation %  82 %     5 Minute  Liters of Oxygen  4 L     6 Minute Oxygen Saturation %  88 %     6 Minute Liters of Oxygen  4 L     2 Minute Post Oxygen Saturation %  93 %     2 Minute Post Liters of Oxygen  4 L       Oxygen Initial Assessment: Oxygen Initial Assessment - 11/07/17 1507      Home Oxygen   Home Oxygen Device  Portable Concentrator;Home Concentrator;E-Tanks    Sleep Oxygen Prescription  CPAP    Liters per minute  3    Home Exercise Oxygen Prescription  Continuous    Liters per minute  4    Home at Rest Exercise Oxygen Prescription  Continuous    Liters per minute  3    Compliance with Home Oxygen Use  Yes      Initial 6 min Walk   Oxygen Used  Portable Concentrator;Pulsed    Liters per minute  5      Program Oxygen Prescription   Program Oxygen Prescription  Continuous    Liters per minute  5      Intervention   Short Term Goals  To learn and demonstrate proper use of respiratory medications;To learn and understand importance of maintaining oxygen saturations>88%;To learn and exhibit compliance with exercise, home and travel O2 prescription;To learn and understand importance of monitoring SPO2 with pulse oximeter and demonstrate accurate use of the pulse oximeter.;To learn and demonstrate proper pursed lip breathing techniques or other breathing techniques.    Long  Term Goals  Compliance with respiratory medication;Demonstrates proper use of MDI's;Exhibits proper breathing techniques, such as pursed lip breathing or other method taught during program session;Maintenance of O2 saturations>88%;Verbalizes importance of monitoring SPO2 with pulse oximeter and return demonstration;Exhibits compliance with exercise, home and travel O2 prescription       Oxygen Re-Evaluation: Oxygen Re-Evaluation    Row Name 11/28/17 1643             Program Oxygen Prescription   Program Oxygen Prescription  Continuous       Liters per minute  4       Comments  6 Liters on the treadmill         Home Oxygen    Home Oxygen Device  Portable Concentrator;Home Concentrator;E-Tanks       Sleep Oxygen Prescription  CPAP       Liters per minute  3       Home Exercise Oxygen Prescription  Continuous       Liters per minute  4       Home at Rest Exercise Oxygen Prescription  Continuous  Liters per minute  3       Compliance with Home Oxygen Use  Yes         Goals/Expected Outcomes   Short Term Goals  To learn and demonstrate proper use of respiratory medications;To learn and understand importance of maintaining oxygen saturations>88%;To learn and exhibit compliance with exercise, home and travel O2 prescription;To learn and understand importance of monitoring SPO2 with pulse oximeter and demonstrate accurate use of the pulse oximeter.;To learn and demonstrate proper pursed lip breathing techniques or other breathing techniques.       Long  Term Goals  Compliance with respiratory medication;Demonstrates proper use of MDI's;Exhibits proper breathing techniques, such as pursed lip breathing or other method taught during program session;Maintenance of O2 saturations>88%;Verbalizes importance of monitoring SPO2 with pulse oximeter and return demonstration;Exhibits compliance with exercise, home and travel O2 prescription       Comments  Patient has been using 3 liters of oxygen at home while resting. He uses his pulse oximeter to measure his oxygen and states he is in the mid 90's while resting. He states he is taking his nebulizers and inhalers as prescribed. He is workinhg on using PLB more to help when he gets short of breath.       Goals/Expected Outcomes  Short: Become more profiecient at using PLB.   Long: Become independent at using PLB.          Oxygen Discharge (Final Oxygen Re-Evaluation): Oxygen Re-Evaluation - 11/28/17 1643      Program Oxygen Prescription   Program Oxygen Prescription  Continuous    Liters per minute  4    Comments  6 Liters on the treadmill      Home Oxygen   Home  Oxygen Device  Portable Concentrator;Home Concentrator;E-Tanks    Sleep Oxygen Prescription  CPAP    Liters per minute  3    Home Exercise Oxygen Prescription  Continuous    Liters per minute  4    Home at Rest Exercise Oxygen Prescription  Continuous    Liters per minute  3    Compliance with Home Oxygen Use  Yes      Goals/Expected Outcomes   Short Term Goals  To learn and demonstrate proper use of respiratory medications;To learn and understand importance of maintaining oxygen saturations>88%;To learn and exhibit compliance with exercise, home and travel O2 prescription;To learn and understand importance of monitoring SPO2 with pulse oximeter and demonstrate accurate use of the pulse oximeter.;To learn and demonstrate proper pursed lip breathing techniques or other breathing techniques.    Long  Term Goals  Compliance with respiratory medication;Demonstrates proper use of MDI's;Exhibits proper breathing techniques, such as pursed lip breathing or other method taught during program session;Maintenance of O2 saturations>88%;Verbalizes importance of monitoring SPO2 with pulse oximeter and return demonstration;Exhibits compliance with exercise, home and travel O2 prescription    Comments  Patient has been using 3 liters of oxygen at home while resting. He uses his pulse oximeter to measure his oxygen and states he is in the mid 90's while resting. He states he is taking his nebulizers and inhalers as prescribed. He is workinhg on using PLB more to help when he gets short of breath.    Goals/Expected Outcomes  Short: Become more profiecient at using PLB.   Long: Become independent at using PLB.       Initial Exercise Prescription: Initial Exercise Prescription - 11/07/17 1500      Date of Initial Exercise RX and Referring  Provider   Date  11/07/17    Referring Provider  [name]      Treadmill   MPH  1.5    Grade  0    Minutes  15    METs  2.15      NuStep   Level  2    SPM  80     Minutes  15    METs  2      Arm Ergometer   Level  1    RPM  40    Minutes  15    METs  2      Prescription Details   Frequency (times per week)  3    Duration  Progress to 45 minutes of aerobic exercise without signs/symptoms of physical distress      Intensity   THRR 40-80% of Max Heartrate  93-122    Ratings of Perceived Exertion  11-13    Perceived Dyspnea  0-4      Resistance Training   Training Prescription  Yes    Weight  2 lb    Reps  10-15       Perform Capillary Blood Glucose checks as needed.  Exercise Prescription Changes: Exercise Prescription Changes    Row Name 11/16/17 1000 11/16/17 1100 11/30/17 1200 12/14/17 1100       Response to Exercise   Blood Pressure (Admit)  -  134/65  130/64  128/58    Blood Pressure (Exit)  -  122/68  128/56  134/74    Heart Rate (Admit)  -  70 bpm  72 bpm  85 bpm    Heart Rate (Exercise)  -  85 bpm  81 bpm  91 bpm    Heart Rate (Exit)  -  68 bpm  80 bpm  74 bpm    Oxygen Saturation (Admit)  -  93 %  96 %  92 %    Oxygen Saturation (Exercise)  -  85 % inc O2 to 6L - 93% after  87 %  85 %    Oxygen Saturation (Exit)  -  89 %  91 %  89 %    Rating of Perceived Exertion (Exercise)  -  _0 Perceived Dyspnea (Exercise)  -  _1 Symptoms  -  none  none  none    Duration  -  Continue with 45 min of aerobic exercise without signs/symptoms of physical distress.  Continue with 45 min of aerobic exercise without signs/symptoms of physical distress.  Continue with 45 min of aerobic exercise without signs/symptoms of physical distress.    Intensity  -  THRR unchanged meeting RPE goals  THRR unchanged  THRR unchanged      Progression   Progression  -  Continue to progress workloads to maintain intensity without signs/symptoms of physical distress.  Continue to progress workloads to maintain intensity without signs/symptoms of physical distress.  Continue to progress workloads to maintain intensity without signs/symptoms of  physical distress.    Average METs  -  2  1.6  1.85      Resistance Training   Training Prescription  -  Yes  Yes  Yes    Weight  -  3 lb  3 lb  3 lb    Reps  -  10-15  10-15  10-15      Interval Training   Interval Training  -  No  No  No      Oxygen   Oxygen  -  Continuous  Continuous  Continuous    Liters  -  _0 Treadmill   MPH  -  -  -  1    Grade  -  -  -  0    Minutes  -  -  -  15    METs  -  -  -  1.77      NuStep   Level  -  _1 SPM  -  80  80  80    Minutes  -  _2 METs  -  2.2  1.8  1.9      Arm Ergometer   Level  -  1  1  -    RPM  -  40  40  -    Minutes  -  15  15  -    METs  -  1.9  1.2  -      Home Exercise Plan   Plans to continue exercise at  Home (comment) walking  Home (comment) walking  Home (comment) walking  Home (comment) walking    Frequency  Add 1 additional day to program exercise sessions.  Add 1 additional day to program exercise sessions.  Add 1 additional day to program exercise sessions.  Add 1 additional day to program exercise sessions.    Initial Home Exercises Provided  11/16/17  11/16/17  11/16/17  11/16/17       Exercise Comments: Exercise Comments    Row Name 11/09/17 1040           Exercise Comments   First full day of exercise!  Patient was oriented to gym and equipment including functions, settings, policies, and procedures.  Patient's individual exercise prescription and treatment plan were reviewed.  All starting workloads were established based on the results of the 6 minute walk test done at initial orientation visit.  The plan for exercise progression was also introduced and progression will be customized based on patient's performance and goals.          Exercise Goals and Review: Exercise Goals    Row Name 11/07/17 1520             Exercise Goals   Increase Physical Activity  Yes       Intervention  Provide advice, education, support and counseling about physical activity/exercise  needs.;Develop an individualized exercise prescription for aerobic and resistive training based on initial evaluation findings, risk stratification, comorbidities and participant's personal goals.       Expected Outcomes  Short Term: Attend rehab on a regular basis to increase amount of physical activity.;Long Term: Add in home exercise to make exercise part of routine and to increase amount of physical activity.;Long Term: Exercising regularly at least 3-5 days a week.       Increase Strength and Stamina  Yes       Intervention  Provide advice, education, support and counseling about physical activity/exercise needs.;Develop an individualized exercise prescription for aerobic and resistive training based on initial evaluation findings, risk stratification, comorbidities and participant's personal goals.       Expected Outcomes  Short Term: Increase workloads from initial exercise prescription for resistance, speed, and METs.;Short Term: Perform resistance training exercises routinely during rehab and add in resistance training at home;Long  Term: Improve cardiorespiratory fitness, muscular endurance and strength as measured by increased METs and functional capacity (6MWT)       Able to understand and use rate of perceived exertion (RPE) scale  Yes       Intervention  Provide education and explanation on how to use RPE scale       Expected Outcomes  Short Term: Able to use RPE daily in rehab to express subjective intensity level;Long Term:  Able to use RPE to guide intensity level when exercising independently       Able to understand and use Dyspnea scale  Yes       Intervention  Provide education and explanation on how to use Dyspnea scale       Expected Outcomes  Short Term: Able to use Dyspnea scale daily in rehab to express subjective sense of shortness of breath during exertion;Long Term: Able to use Dyspnea scale to guide intensity level when exercising independently       Knowledge and  understanding of Target Heart Rate Range (THRR)  Yes       Intervention  Provide education and explanation of THRR including how the numbers were predicted and where they are located for reference       Expected Outcomes  Short Term: Able to state/look up THRR;Short Term: Able to use daily as guideline for intensity in rehab;Long Term: Able to use THRR to govern intensity when exercising independently       Able to check pulse independently  Yes       Intervention  Provide education and demonstration on how to check pulse in carotid and radial arteries.;Review the importance of being able to check your own pulse for safety during independent exercise       Expected Outcomes  Short Term: Able to explain why pulse checking is important during independent exercise;Long Term: Able to check pulse independently and accurately       Understanding of Exercise Prescription  Yes       Intervention  Provide education, explanation, and written materials on patient's individual exercise prescription       Expected Outcomes  Short Term: Able to explain program exercise prescription;Long Term: Able to explain home exercise prescription to exercise independently          Exercise Goals Re-Evaluation : Exercise Goals Re-Evaluation    Row Name 11/09/17 1040 11/16/17 1024 11/16/17 1136 11/30/17 1246 12/14/17 1202     Exercise Goal Re-Evaluation   Exercise Goals Review  Increase Physical Activity;Able to understand and use rate of perceived exertion (RPE) scale;Knowledge and understanding of Target Heart Rate Range (THRR);Increase Strength and Stamina;Able to understand and use Dyspnea scale  Increase Physical Activity;Able to understand and use rate of perceived exertion (RPE) scale;Knowledge and understanding of Target Heart Rate Range (THRR);Understanding of Exercise Prescription;Increase Strength and Stamina;Able to understand and use Dyspnea scale;Able to check pulse independently  Increase Physical  Activity;Increase Strength and Stamina;Able to understand and use rate of perceived exertion (RPE) scale;Able to understand and use Dyspnea scale;Knowledge and understanding of Target Heart Rate Range (THRR);Able to check pulse independently  Increase Physical Activity;Increase Strength and Stamina;Able to understand and use rate of perceived exertion (RPE) scale;Able to understand and use Dyspnea scale  Increase Physical Activity;Increase Strength and Stamina;Able to understand and use rate of perceived exertion (RPE) scale;Able to understand and use Dyspnea scale;Able to check pulse independently   Comments  Reviewed RPE scale, THR and program prescription with pt today.  Pt  voiced understanding and was given a copy of goals to take home.   Reviewed home exercise with pt today.  Pt plans to walk at home for exercise.  He will re join [employer] after graduation.  Reviewed THR, pulse, RPE, sign and symptoms, NTG use, and when to call 911 or MD.  Also discussed weather considerations and indoor options.  Pt voiced understanding.  Amjad is in his second week of starting back to [employer].    Amjad has not increased MET levels.  He is reaching RPE target goals.  Staff will monitor progress and encourage increased effort.  Amjad meets RPE goals for exercise.  His exercise at this time is maintaining his current level of fitness.   Expected Outcomes  Short: Use RPE daily to regulate intensity. Long: Follow program prescription in THR.  Short: Start by adding in at least one day of walking at home.  Long: Continue to work on increasing activity levels at home.   Short - attend LW 3 times per week Long - improve MET level  Short - increase intensity at current levels Long - improve MET level  Short - continue to attend consistently Long - maintain exercise on his own      Discharge Exercise Prescription (Final Exercise Prescription Changes): Exercise Prescription Changes - 12/14/17 1100      Response to Exercise    Blood Pressure (Admit)  128/58    Blood Pressure (Exit)  134/74    Heart Rate (Admit)  85 bpm    Heart Rate (Exercise)  91 bpm    Heart Rate (Exit)  74 bpm    Oxygen Saturation (Admit)  92 %    Oxygen Saturation (Exercise)  85 %    Oxygen Saturation (Exit)  89 %    Rating of Perceived Exertion (Exercise)  13    Perceived Dyspnea (Exercise)  2    Symptoms  none    Duration  Continue with 45 min of aerobic exercise without signs/symptoms of physical distress.    Intensity  THRR unchanged      Progression   Progression  Continue to progress workloads to maintain intensity without signs/symptoms of physical distress.    Average METs  1.85      Resistance Training   Training Prescription  Yes    Weight  3 lb    Reps  10-15      Interval Training   Interval Training  No      Oxygen   Oxygen  Continuous    Liters  6      Treadmill   MPH  1    Grade  0    Minutes  15    METs  1.77      NuStep   Level  2    SPM  80    Minutes  15    METs  1.9      Home Exercise Plan   Plans to continue exercise at  Home (comment)   walking   Frequency  Add 1 additional day to program exercise sessions.    Initial Home Exercises Provided  11/16/17       Nutrition:  Target Goals: Understanding of nutrition guidelines, daily intake of sodium <1512m, cholesterol <2022m calories 30% from fat and 7% or less from saturated fats, daily to have 5 or more servings of fruits and vegetables.  Biometrics: Pre Biometrics - 11/07/17 1518      Pre Biometrics   Height  5'  5.5" (1.664 m)    Weight  150 lb 11.2 oz (68.4 kg)    Waist Circumference  37.5 inches    Hip Circumference  38 inches    Waist to Hip Ratio  0.99 %    BMI (Calculated)  24.69    Single Leg Stand  27.7 seconds        Nutrition Therapy Plan and Nutrition Goals: Nutrition Therapy & Goals - 11/09/17 1130      Nutrition Therapy   Diet  TLC    Protein (specify units)  7-8oz    Fiber  30 grams    Whole Grain Foods  3  servings    Saturated Fats  13 max. grams    Fruits and Vegetables  5 servings/day   8 ideal. eats fruits and vegetables regularly but eats small portions   Sodium  1500 grams      Personal Nutrition Goals   Nutrition Goal  Continue to drink your daily protein shake, great job. Consider increasing to 2/day if you start to have trouble maintaining CBW    Personal Goal #2  Drink a glass of milk each day, ideally in the afternoon between lunch and dinner, or in the evening    Personal Goal #3  Try to eat snacks consistently between meals, which will help you meet your calorie needs d/t only eaing small portions at meals. Snacks that contain fat are typically more calorie-dense and so are optimal choices    Comments  He has lost a few pounds since his last round of classes in this department. His appetite continues to be poor and he c.o altered taste of foods. He has started to put more focus on consuming protein sources IE more meat and will snack occasionally between meals      Intervention Plan   Intervention  Prescribe, educate and counsel regarding individualized specific dietary modifications aiming towards targeted core components such as weight, hypertension, lipid management, diabetes, heart failure and other comorbidities.    Expected Outcomes  Short Term Goal: Understand basic principles of dietary content, such as calories, fat, sodium, cholesterol and nutrients.;Short Term Goal: A plan has been developed with personal nutrition goals set during dietitian appointment.;Long Term Goal: Adherence to prescribed nutrition plan.       Nutrition Assessments: Nutrition Assessments - 11/07/17 1534      MEDFICTS Scores   Pre Score  26       Nutrition Goals Re-Evaluation: Nutrition Goals Re-Evaluation    [place] Name 11/09/17 1138 12/12/17 1124           Goals   Nutrition Goal  Continue to drink your daily protein shake, great job. Consider increasing to 2/day if you start to have trouble  maintaining CBW  Continue to drink your daily protein shake, consider increasing to 2/day if you are having difficulty maintaining your CBW. Drink a glass of milk and/or consume calorie-dense snacks between lunch and supper or in the evening to help meet daily nutrient needs      Comment  He has started to drink a protein drink daily, which he was not doing in the past, in order to better meet his daily protein requirements  He has been drinking at least 1 nutritional shake/day consistently. He sometimes has a snack or glass of milk between meals but not consistently. States he has been maintaining wt at 150#      Expected Outcome  He will drink 1-2 protien shakes daily consistently  He will  continue to monitor his wt closely, and add in additional calorie/protein sources IE protein shake, milk, snacks as needed to help meet nutritional needs and keep wt stable        Personal Goal #2 Re-Evaluation   Personal Goal #2  Drink a glass of milk each day, ideally in the afternoon between lunch and dinner, or in the evening  -        Personal Goal #3 Re-Evaluation   Personal Goal #3  Try to eat snacks consistently between meals, which will help you meet your calorie needs d/t only eating small portions at meals. Snacks that contain fat are typically more calorie dense and so are optimal choices  -         Nutrition Goals Discharge (Final Nutrition Goals Re-Evaluation): Nutrition Goals Re-Evaluation - 12/12/17 1124      Goals   Nutrition Goal  Continue to drink your daily protein shake, consider increasing to 2/day if you are having difficulty maintaining your CBW. Drink a glass of milk and/or consume calorie-dense snacks between lunch and supper or in the evening to help meet daily nutrient needs    Comment  He has been drinking at least 1 nutritional shake/day consistently. He sometimes has a snack or glass of milk between meals but not consistently. States he has been maintaining wt at 150#    Expected  Outcome  He will continue to monitor his wt closely, and add in additional calorie/protein sources IE protein shake, milk, snacks as needed to help meet nutritional needs and keep wt stable       Psychosocial: Target Goals: Acknowledge presence or absence of significant depression and/or stress, maximize coping skills, provide positive support system. Participant is able to verbalize types and ability to use techniques and skills needed for reducing stress and depression.   Initial Review & Psychosocial Screening: Initial Psych Review & Screening - 11/07/17 1457      Initial Review   Current issues with  Current Depression;Current Stress Concerns    Source of Stress Concerns  Chronic Illness;Unable to perform yard/household activities      [place]?  Yes    Comments  He can look to his wife and children for support.      Barriers   Psychosocial barriers to participate in program  The patient should benefit from training in stress management and relaxation.      Screening Interventions   Interventions  Program counselor consult;Provide feedback about the scores to participant;To provide support and resources with identified psychosocial needs;Encouraged to exercise    Expected Outcomes  Short Term goal: Utilizing psychosocial counselor, staff and physician to assist with identification of specific Stressors or current issues interfering with healing process. Setting desired goal for each stressor or current issue identified.;Long Term Goal: Stressors or current issues are controlled or eliminated.;Short Term goal: Identification and review with participant of any Quality of Life or Depression concerns found by scoring the questionnaire.;Long Term goal: The participant improves quality of Life and PHQ9 Scores as seen by post scores and/or verbalization of changes       Quality of Life Scores:  Scores of 19 and below usually indicate a poorer quality of life in  these areas.  A difference of  2-3 points is a clinically meaningful difference.  A difference of 2-3 points in the total score of the Quality of Life Index has been associated with significant improvement in overall quality of life, self-image,  physical symptoms, and general health in studies assessing change in quality of life.  PHQ-9: Recent Review Flowsheet Data    Depression screen [facility] 2/9 11/07/2017 10/13/2017 06/27/2017 01/26/2017 01/24/2017   Decreased Interest 0 0 0 1 1   Down, Depressed, Hopeless 0 0 0 1 1   PHQ - 2 Score 0 0 0 2 2   Altered sleeping 2 - - 0 3   Tired, decreased energy 2 - - 1 1   Change in appetite 1 - - 2 3   Feeling bad or failure about yourself  0 - - 0 3   Trouble concentrating 0 - - 0 0   Moving slowly or fidgety/restless 0 - - 0 0   Suicidal thoughts 0 - - 0 0   PHQ-9 Score 5 - - 5 12   Difficult doing work/chores Somewhat difficult - - Somewhat difficult Somewhat difficult     Interpretation of Total Score  Total Score Depression Severity:  1-4 = Minimal depression, 5-9 = Mild depression, 10-14 = Moderate depression, 15-19 = Moderately severe depression, 20-27 = Severe depression   Psychosocial Evaluation and Intervention: Psychosocial Evaluation - 11/14/17 1122      Psychosocial Evaluation & Interventions   Interventions  Stress management education;Encouraged to exercise with the program and follow exercise prescription    Comments  Mr. Freer has returned to this program after approximately one year due to continued issues with his heart and his breathing.  He is an 84 year old who continues to have a strong support system and reports sleeping well and an improved appetite.  Mr. Jagiello denies a history of depression or anxiety or any current symptoms and is typically in a positive mood.  He has current stress with his health and also he and spouse are keeping his 31 year old grandson while the parents are in [place].  He was happy to report this is only for a  week!  He has goals to just improve his breathing and is already feeling stronger per his report since coming into this program.  Staff will follow with him.      Expected Outcomes  Short:  Mr. Woody will exercise consistently to continue his progress of improved breathing and for his stress reduction.      Long:  Mr. Nila will continue exercising consistently for his health and his mental health.      Continue Psychosocial Services   Follow up required by staff       Psychosocial Re-Evaluation: Psychosocial Re-Evaluation    [place] Name 11/28/17 1648             Psychosocial Re-Evaluation   Current issues with  Current Stress Concerns       Comments  Patient states that the only stress he has is with his breathing and some properties that he is trying to deal with.       Expected Outcomes  Short: Attend [name] stress management education to decrease stress. Long: Maintain exercise Post [name] to keep stress at a minimum.       Interventions  Encouraged to attend Cardiac Rehabilitation for the exercise       Continue Psychosocial Services   Follow up required by staff          Psychosocial Discharge (Final Psychosocial Re-Evaluation): Psychosocial Re-Evaluation - 11/28/17 1648      Psychosocial Re-Evaluation   Current issues with  Current Stress Concerns    Comments  Patient states that the  only stress he has is with his breathing and some properties that he is trying to deal with.    Expected Outcomes  Short: Attend [name] stress management education to decrease stress. Long: Maintain exercise Post [name] to keep stress at a minimum.    Interventions  Encouraged to attend Cardiac Rehabilitation for the exercise    Continue Psychosocial Services   Follow up required by staff       Education: Education Goals: Education classes will be provided on a weekly basis, covering required topics. Participant will state understanding/return demonstration of topics  presented.  Learning Barriers/Preferences: Learning Barriers/Preferences - 11/07/17 1457      Learning Barriers/Preferences   Learning Barriers  Hearing   sometines he wears a hearing aid   Learning Preferences  Audio;Written Material       Education Topics:  Initial Evaluation Education: - Verbal, written and demonstration of respiratory meds, oximetry and breathing techniques. Instruction on use of nebulizers and MDIs and importance of monitoring MDI activations.   Pulmonary Rehab from 12/16/2017 in [facility] Cardiac and Pulmonary Rehab  Date  11/07/17  Educator  [facility]  Instruction Review Code  1- Verbalizes Understanding      General Nutrition Guidelines/Fats and Fiber: -Group instruction provided by verbal, written material, models and posters to present the general guidelines for heart healthy nutrition. Gives an explanation and review of dietary fats and fiber.   Pulmonary Rehab from 12/16/2017 in [facility] Cardiac and Pulmonary Rehab  Date  11/30/17  Educator  [facility]  Instruction Review Code  1- Verbalizes Understanding      Controlling Sodium/Reading Food Labels: -Group verbal and written material supporting the discussion of sodium use in heart healthy nutrition. Review and explanation with models, verbal and written materials for utilization of the food label.   Pulmonary Rehab from 12/16/2017 in [facility] Cardiac and Pulmonary Rehab  Date  12/07/17  Educator  [facility]  Instruction Review Code  1- Verbalizes Understanding      Exercise Physiology & General Exercise Guidelines: - Group verbal and written instruction with models to review the exercise physiology of the cardiovascular system and associated critical values. Provides general exercise guidelines with specific guidelines to those with heart or lung disease.    Pulmonary Rehab from 12/16/2017 in [facility] Cardiac and Pulmonary Rehab  Date  12/14/17  Educator  [facility]  Instruction Review Code  5- Refused Teaching      Aerobic Exercise &  Resistance Training: - Gives group verbal and written instruction on the various components of exercise. Focuses on aerobic and resistive training programs and the benefits of this training and how to safely progress through these programs.   Cardiac Rehab from 05/04/2017 in [facility] Cardiac and Pulmonary Rehab  Date  03/23/17  Educator  [facility]  Instruction Review Code  1- Verbalizes Understanding      Flexibility, Balance, Mind/Body Relaxation: Provides group verbal/written instruction on the benefits of flexibility and balance training, including mind/body exercise modes such as yoga, pilates and tai chi.  Demonstration and skill practice provided.   Pulmonary Rehab from 07/28/2015 in [facility] Cardiac and Pulmonary Rehab  Date  05/07/15  Educator  [facility]  Instruction Review Code (retired)  2- meets goals/outcomes      Stress and Anxiety: - Provides group verbal and written instruction about the health risks of elevated stress and causes of high stress.  Discuss the correlation between heart/lung disease and anxiety and treatment options. Review healthy ways to manage with stress and anxiety.  Cardiac Rehab from 05/04/2017 in [facility] Cardiac and Pulmonary Rehab  Date  04/13/17  Educator  [facility]  Instruction Review Code  1- Verbalizes Understanding      Depression: - Provides group verbal and written instruction on the correlation between heart/lung disease and depressed mood, treatment options, and the stigmas associated with seeking treatment.   Exercise & Equipment Safety: - Individual verbal instruction and demonstration of equipment use and safety with use of the equipment.   Pulmonary Rehab from 12/16/2017 in [facility] Cardiac and Pulmonary Rehab  Date  11/07/17  Educator  [facility]  Instruction Review Code  1- Verbalizes Understanding      Infection Prevention: - Provides verbal and written material to individual with discussion of infection control including proper hand washing and proper equipment  cleaning during exercise session.   Pulmonary Rehab from 12/16/2017 in [facility] Cardiac and Pulmonary Rehab  Date  11/07/17  Educator  [facility]  Instruction Review Code  1- Verbalizes Understanding      Falls Prevention: - Provides verbal and written material to individual with discussion of falls prevention and safety.   Pulmonary Rehab from 12/16/2017 in [facility] Cardiac and Pulmonary Rehab  Date  11/07/17  Educator  [facility]  Instruction Review Code  1- Verbalizes Understanding      Diabetes: - Individual verbal and written instruction to review signs/symptoms of diabetes, desired ranges of glucose level fasting, after meals and with exercise. Advice that pre and post exercise glucose checks will be done for 3 sessions at entry of program.   Chronic Lung Diseases: - Group verbal and written instruction to review updates, respiratory medications, advancements in procedures and treatments. Discuss use of supplemental oxygen including available portable oxygen systems, continuous and intermittent flow rates, concentrators, personal use and safety guidelines. Review proper use of inhaler and spacers. Provide informative websites for self-education.    Pulmonary Rehab from 12/16/2017 in [facility] Cardiac and Pulmonary Rehab  Date  12/02/17  Educator  [facility]  Instruction Review Code  5- Refused Teaching Neoma Laming had]      Energy Conservation: - Provide group verbal and written instruction for methods to conserve energy, plan and organize activities. Instruct on pacing techniques, use of adaptive equipment and posture/positioning to relieve shortness of breath.   Pulmonary Rehab from 12/16/2017 in [facility] Cardiac and Pulmonary Rehab  Date  11/09/17  Educator  [facility]  Instruction Review Code  1- Verbalizes Understanding      Triggers and Exacerbations: - Group verbal and written instruction to review types of environmental triggers and ways to prevent exacerbations. Discuss weather changes, air quality and the benefits  of nasal washing. Review warning signs and symptoms to help prevent infections. Discuss techniques for effective airway clearance, coughing, and vibrations.   AED/CPR: - Group verbal and written instruction with the use of models to demonstrate the basic use of the AED with the basic ABC's of resuscitation.   Pulmonary Rehab from 07/28/2015 in [facility] Cardiac and Pulmonary Rehab  Date  05/23/15  Educator  CE  Instruction Review Code (retired)  2- Lawyer and Physiology of the Lungs: - Group verbal and written instruction with the use of models to provide basic lung anatomy and physiology related to function, structure and complications of lung disease.   Pulmonary Rehab from 12/16/2017 in [facility] Cardiac and Pulmonary Rehab  Date  11/16/17  Educator  [facility]  Instruction Review Code  1- Verbalizes Understanding      Anatomy & Physiology  of the Heart: - Group verbal and written instruction and models provide basic cardiac anatomy and physiology, with the coronary electrical and arterial systems. Review of Valvular disease and Heart Failure   Cardiac Rehab from 05/04/2017 in [facility] Cardiac and Pulmonary Rehab  Date  01/31/17  Educator  CE  Instruction Review Code  1- Verbalizes Understanding      Cardiac Medications: - Group verbal and written instruction to review commonly prescribed medications for heart disease. Reviews the medication, class of the drug, and side effects.   Cardiac Rehab from 05/04/2017 in [facility] Cardiac and Pulmonary Rehab  Date  02/14/17  Educator  CE  Instruction Review Code  1- Verbalizes Understanding      Know Your Numbers and Risk Factors: -Group verbal and written instruction about important numbers in your health.  Discussion of what are risk factors and how they play a role in the disease process.  Review of Cholesterol, Blood Pressure, Diabetes, and BMI and the role they play in your overall health.   Pulmonary Rehab from 12/16/2017 in [facility]  Cardiac and Pulmonary Rehab  Date  12/16/17  Educator  [facility]  Instruction Review Code  5- Refused Teaching      Sleep Hygiene: -Provides group verbal and written instruction about how sleep can affect your health.  Define sleep hygiene, discuss sleep cycles and impact of sleep habits. Review good sleep hygiene tips.    Cardiac Rehab from 05/04/2017 in [facility] Cardiac and Pulmonary Rehab  Date  04/27/17  Educator  [facility]  Instruction Review Code  1- Verbalizes Understanding      Other: -Provides group and verbal instruction on various topics (see comments)    Knowledge Questionnaire Score: Knowledge Questionnaire Score - 11/07/17 1506      Knowledge Questionnaire Score   Pre Score  14/18   reviewed with patient       Core Components/Risk Factors/Patient Goals at Admission: Personal Goals and Risk Factors at Admission - 11/07/17 1509      Core Components/Risk Factors/Patient Goals on Admission    Weight Management  Yes;Weight Maintenance    Intervention  Weight Management: Develop a combined nutrition and exercise program designed to reach desired caloric intake, while maintaining appropriate intake of nutrient and fiber, sodium and fats, and appropriate energy expenditure required for the weight goal.;Weight Management: Provide education and appropriate resources to help participant work on and attain dietary goals.;Weight Management/Obesity: Establish reasonable short term and long term weight goals.    Admit Weight  150 lb 11.2 oz (68.4 kg)    Goal Weight: Short Term  150 lb (68 kg)    Goal Weight: Long Term  150 lb (68 kg)    Expected Outcomes  Weight Maintenance: Understanding of the daily nutrition guidelines, which includes 25-35% calories from fat, 7% or less cal from saturated fats, less than 283m cholesterol, less than 1.5gm of sodium, & 5 or more servings of fruits and vegetables daily;Long Term: Adherence to nutrition and physical activity/exercise program aimed toward  attainment of established weight goal;Short Term: Continue to assess and modify interventions until short term weight is achieved;Understanding recommendations for meals to include 15-35% energy as protein, 25-35% energy from fat, 35-60% energy from carbohydrates, less than 2079mof dietary cholesterol, 20-35 gm of total fiber daily;Understanding of distribution of calorie intake throughout the day with the consumption of 4-5 meals/snacks    Improve shortness of breath with ADL's  Yes    Intervention  Provide education, individualized exercise plan and daily  activity instruction to help decrease symptoms of SOB with activities of daily living.    Expected Outcomes  Short Term: Improve cardiorespiratory fitness to achieve a reduction of symptoms when performing ADLs;Long Term: Be able to perform more ADLs without symptoms or delay the onset of symptoms    Hypertension  Yes    Intervention  Provide education on lifestyle modifcations including regular physical activity/exercise, weight management, moderate sodium restriction and increased consumption of fresh fruit, vegetables, and low fat dairy, alcohol moderation, and smoking cessation.;Monitor prescription use compliance.    Expected Outcomes  Short Term: Continued assessment and intervention until BP is < 140/78m HG in hypertensive participants. < 130/866mHG in hypertensive participants with diabetes, heart failure or chronic kidney disease.;Long Term: Maintenance of blood pressure at goal levels.    Lipids  Yes    Intervention  Provide education and support for participant on nutrition & aerobic/resistive exercise along with prescribed medications to achieve LDL <7041mHDL >1m75m  Expected Outcomes  Short Term: Participant states understanding of desired cholesterol values and is compliant with medications prescribed. Participant is following exercise prescription and nutrition guidelines.;Long Term: Cholesterol controlled with medications as  prescribed, with individualized exercise RX and with personalized nutrition plan. Value goals: LDL < 70mg51mL > 40 mg.       Core Components/Risk Factors/Patient Goals Review:  Goals and Risk Factor Review    [place] Name 11/28/17 1649             Core Components/Risk Factors/Patient Goals Review   Personal Goals Review  Hypertension;Improve shortness of breath with ADL's;Weight Management/Obesity;Lipids       Review  Patient states his lipids are under control. It is hard to tell if he is getting better at his ADL's since his family does alot for him. He does not cook or wash dishes. Sometime he feels like his shortness of breath is a little better at times. He states it is not worse though.       Expected Outcomes  Short: Attend [name] regularly to improve shortness of breath with ADL's. Long: maintain independence with ADL's           Core Components/Risk Factors/Patient Goals at Discharge (Final Review):  Goals and Risk Factor Review - 11/28/17 1649      Core Components/Risk Factors/Patient Goals Review   Personal Goals Review  Hypertension;Improve shortness of breath with ADL's;Weight Management/Obesity;Lipids    Review  Patient states his lipids are under control. It is hard to tell if he is getting better at his ADL's since his family does alot for him. He does not cook or wash dishes. Sometime he feels like his shortness of breath is a little better at times. He states it is not worse though.    Expected Outcomes  Short: Attend [name] regularly to improve shortness of breath with ADL's. Long: maintain independence with ADL's        ITP Comments: ITP Comments    Row Name 11/07/17 1445 11/14/17 0808 12/12/17 0816 12/22/17 1245 12/22/17 1246   ITP Comments  Medical Evaluation completed. Chart sent for review and changes to Dr. Mark Emily Filbertctor of LungWCarlislegnosis can be found in [facility] encounter 11/01/17  30 day review completed. ITP sent to Dr. Mark Emily Filbertctor of  [facility] with ITP unless changes are made by physician  30 day review completed. ITP sent to Dr. Mark Emily Filbertctor of Lung[facility]by[facility] with ITP unless changes are made by physician  Patient  called earlier to say he would not be back to [place] until after October 20th. Left patient a message for discharge and to call back with any questions.  Discharge ITP sent and signed by Dr. Sabra Heck.  Discharge Summary routed to PCP and Pulmonologist.      Comments: Discharge ITP

## 2018-01-12 ENCOUNTER — Telehealth: Payer: Self-pay

## 2018-01-12 MED ORDER — PANTOPRAZOLE SODIUM 40 MG PO TBEC: 40.0000 mg | DELAYED_RELEASE_TABLET | Freq: Every day | ORAL | 2 refills | Status: DC | PRN

## 2018-01-12 NOTE — Telephone Encounter (Signed)
Refill of Pantoprazole sent to [facility].

## 2018-01-15 DIAGNOSIS — J9601 Acute respiratory failure with hypoxia: Secondary | ICD-10-CM | POA: Diagnosis not present

## 2018-01-18 ENCOUNTER — Telehealth: Payer: Self-pay | Admitting: Cardiovascular Disease

## 2018-01-18 NOTE — Telephone Encounter (Signed)
Pharmacy calling asking about Pantoprazole we recently prescribed   They need to clarify if provider is aware that there are warnings for that and the Clopidogrel patient is on. It would reduce the antiplatelets in patient if he is taking both.    Would like a call back about this

## 2018-01-19 ENCOUNTER — Ambulatory Visit
Admission: RE | Admit: 2018-01-19 | Discharge: 2018-01-19 | Disposition: A | Discharge status: Home / Self Care | Payer: Medicare Other | Source: Ambulatory Visit | Attending: Nurse Practitioner | Admitting: Nurse Practitioner

## 2018-01-19 ENCOUNTER — Other Ambulatory Visit: Payer: Self-pay | Admitting: Nurse Practitioner

## 2018-01-19 DIAGNOSIS — R0602 Shortness of breath: Secondary | ICD-10-CM

## 2018-01-19 DIAGNOSIS — J8410 Pulmonary fibrosis, unspecified: Secondary | ICD-10-CM | POA: Diagnosis not present

## 2018-01-19 NOTE — Telephone Encounter (Signed)
Call returned to [facility] to verify that per Dr. [name] it was okay to take both medications together. They have verbalized their understanding.

## 2018-01-19 NOTE — Progress Notes (Signed)
Stat chest x-ray ordered for shortness of breath. Call report to Dr. Beverely Risen.

## 2018-01-19 NOTE — Telephone Encounter (Signed)
Please review for refill.  

## 2018-01-20 ENCOUNTER — Telehealth: Payer: Self-pay

## 2018-01-20 ENCOUNTER — Other Ambulatory Visit: Payer: Self-pay

## 2018-01-20 DIAGNOSIS — J9601 Acute respiratory failure with hypoxia: Secondary | ICD-10-CM | POA: Diagnosis not present

## 2018-01-20 MED ORDER — AZITHROMYCIN 250 MG PO TABS: ORAL_TABLET | ORAL | 2 refills | Status: DC

## 2018-01-20 NOTE — Telephone Encounter (Signed)
As per Dr Welton Flakes send zithromax 250mg  take 1 tab daily 30 with 2 to [facility]

## 2018-01-25 ENCOUNTER — Other Ambulatory Visit: Payer: Self-pay

## 2018-01-25 DIAGNOSIS — J8410 Pulmonary fibrosis, unspecified: Secondary | ICD-10-CM | POA: Diagnosis not present

## 2018-01-25 DIAGNOSIS — R0602 Shortness of breath: Secondary | ICD-10-CM | POA: Diagnosis not present

## 2018-01-25 DIAGNOSIS — J9601 Acute respiratory failure with hypoxia: Secondary | ICD-10-CM | POA: Diagnosis not present

## 2018-01-25 DIAGNOSIS — J84115 Respiratory bronchiolitis interstitial lung disease: Secondary | ICD-10-CM | POA: Diagnosis not present

## 2018-01-25 MED ORDER — ACCU-CHEK FASTCLIX LANCETS MISC: 1 refills | Status: DC

## 2018-01-25 MED ORDER — GLUCOSE BLOOD VI STRP: ORAL_STRIP | 1 refills | Status: DC

## 2018-02-09 ENCOUNTER — Other Ambulatory Visit: Payer: Self-pay | Admitting: Internal Medicine

## 2018-02-09 MED ORDER — MOMETASONE FURO-FORMOTEROL FUM 100-5 MCG/ACT IN AERO: 2.0000 | INHALATION_SPRAY | Freq: Two times a day (BID) | RESPIRATORY_TRACT | 11 refills | Status: DC

## 2018-02-13 ENCOUNTER — Ambulatory Visit: Payer: Self-pay | Admitting: Internal Medicine

## 2018-02-13 DIAGNOSIS — J84112 Idiopathic pulmonary fibrosis: Secondary | ICD-10-CM | POA: Diagnosis not present

## 2018-02-13 DIAGNOSIS — Z9981 Dependence on supplemental oxygen: Secondary | ICD-10-CM | POA: Diagnosis not present

## 2018-02-13 DIAGNOSIS — K219 Gastro-esophageal reflux disease without esophagitis: Secondary | ICD-10-CM | POA: Diagnosis not present

## 2018-02-13 DIAGNOSIS — I2510 Atherosclerotic heart disease of native coronary artery without angina pectoris: Secondary | ICD-10-CM | POA: Diagnosis not present

## 2018-02-13 DIAGNOSIS — J9611 Chronic respiratory failure with hypoxia: Secondary | ICD-10-CM | POA: Diagnosis not present

## 2018-02-13 DIAGNOSIS — J849 Interstitial pulmonary disease, unspecified: Secondary | ICD-10-CM | POA: Diagnosis not present

## 2018-02-13 DIAGNOSIS — Z79899 Other long term (current) drug therapy: Secondary | ICD-10-CM | POA: Diagnosis not present

## 2018-02-13 DIAGNOSIS — R0602 Shortness of breath: Secondary | ICD-10-CM | POA: Diagnosis not present

## 2018-02-19 DIAGNOSIS — J9601 Acute respiratory failure with hypoxia: Secondary | ICD-10-CM | POA: Diagnosis not present

## 2018-02-27 ENCOUNTER — Other Ambulatory Visit: Payer: Self-pay | Admitting: Nurse Practitioner

## 2018-02-27 DIAGNOSIS — R945 Abnormal results of liver function studies: Secondary | ICD-10-CM | POA: Diagnosis not present

## 2018-02-28 LAB — HEPATIC FUNCTION PANEL
ALT: 17 IU/L (ref 0–44)
AST: 24 IU/L (ref 0–40)
Albumin: 3.9 g/dL (ref 3.5–4.7)
Alkaline Phosphatase: 80 IU/L (ref 39–117)
Bilirubin Total: 0.3 mg/dL (ref 0.0–1.2)
Bilirubin, Direct: 0.1 mg/dL (ref 0.00–0.40)
Total Protein: 7.9 g/dL (ref 6.0–8.5)

## 2018-03-01 ENCOUNTER — Other Ambulatory Visit: Payer: Self-pay | Admitting: Internal Medicine

## 2018-03-01 ENCOUNTER — Other Ambulatory Visit: Payer: Self-pay | Admitting: Cardiovascular Disease

## 2018-03-02 NOTE — Progress Notes (Signed)
Labs for Dr. Clydie BraunBhatti

## 2018-03-02 NOTE — Progress Notes (Signed)
Chest x-ray for Dr. Clydie BraunBhatti

## 2018-03-03 ENCOUNTER — Telehealth: Payer: Self-pay

## 2018-03-03 NOTE — Telephone Encounter (Signed)
PT WIFE CALLED TO ASK ABOUT PT RECENT LABS. ASKED ADAM TO LOOK AT THE RESULTS AND PER ADAM INFORMED PT WIFE THAT EVERYTHING IS WITHIN NORMAL RANGE.

## 2018-03-19 IMAGING — CR DG CHEST 2V
1 series · 2 of 2 positions shown · non-contrast
Comparison: CT scan dated 12/02/2016 and chest x-ray dated
08/10/2016

CLINICAL DATA: Shortness of breath.  Pulmonary fibrosis.

EXAM:
CHEST  2 VIEW

[Series 1: dg chest 2 view · 0.14mm/px · 2 of 2 slices shown]
[im 1/2]
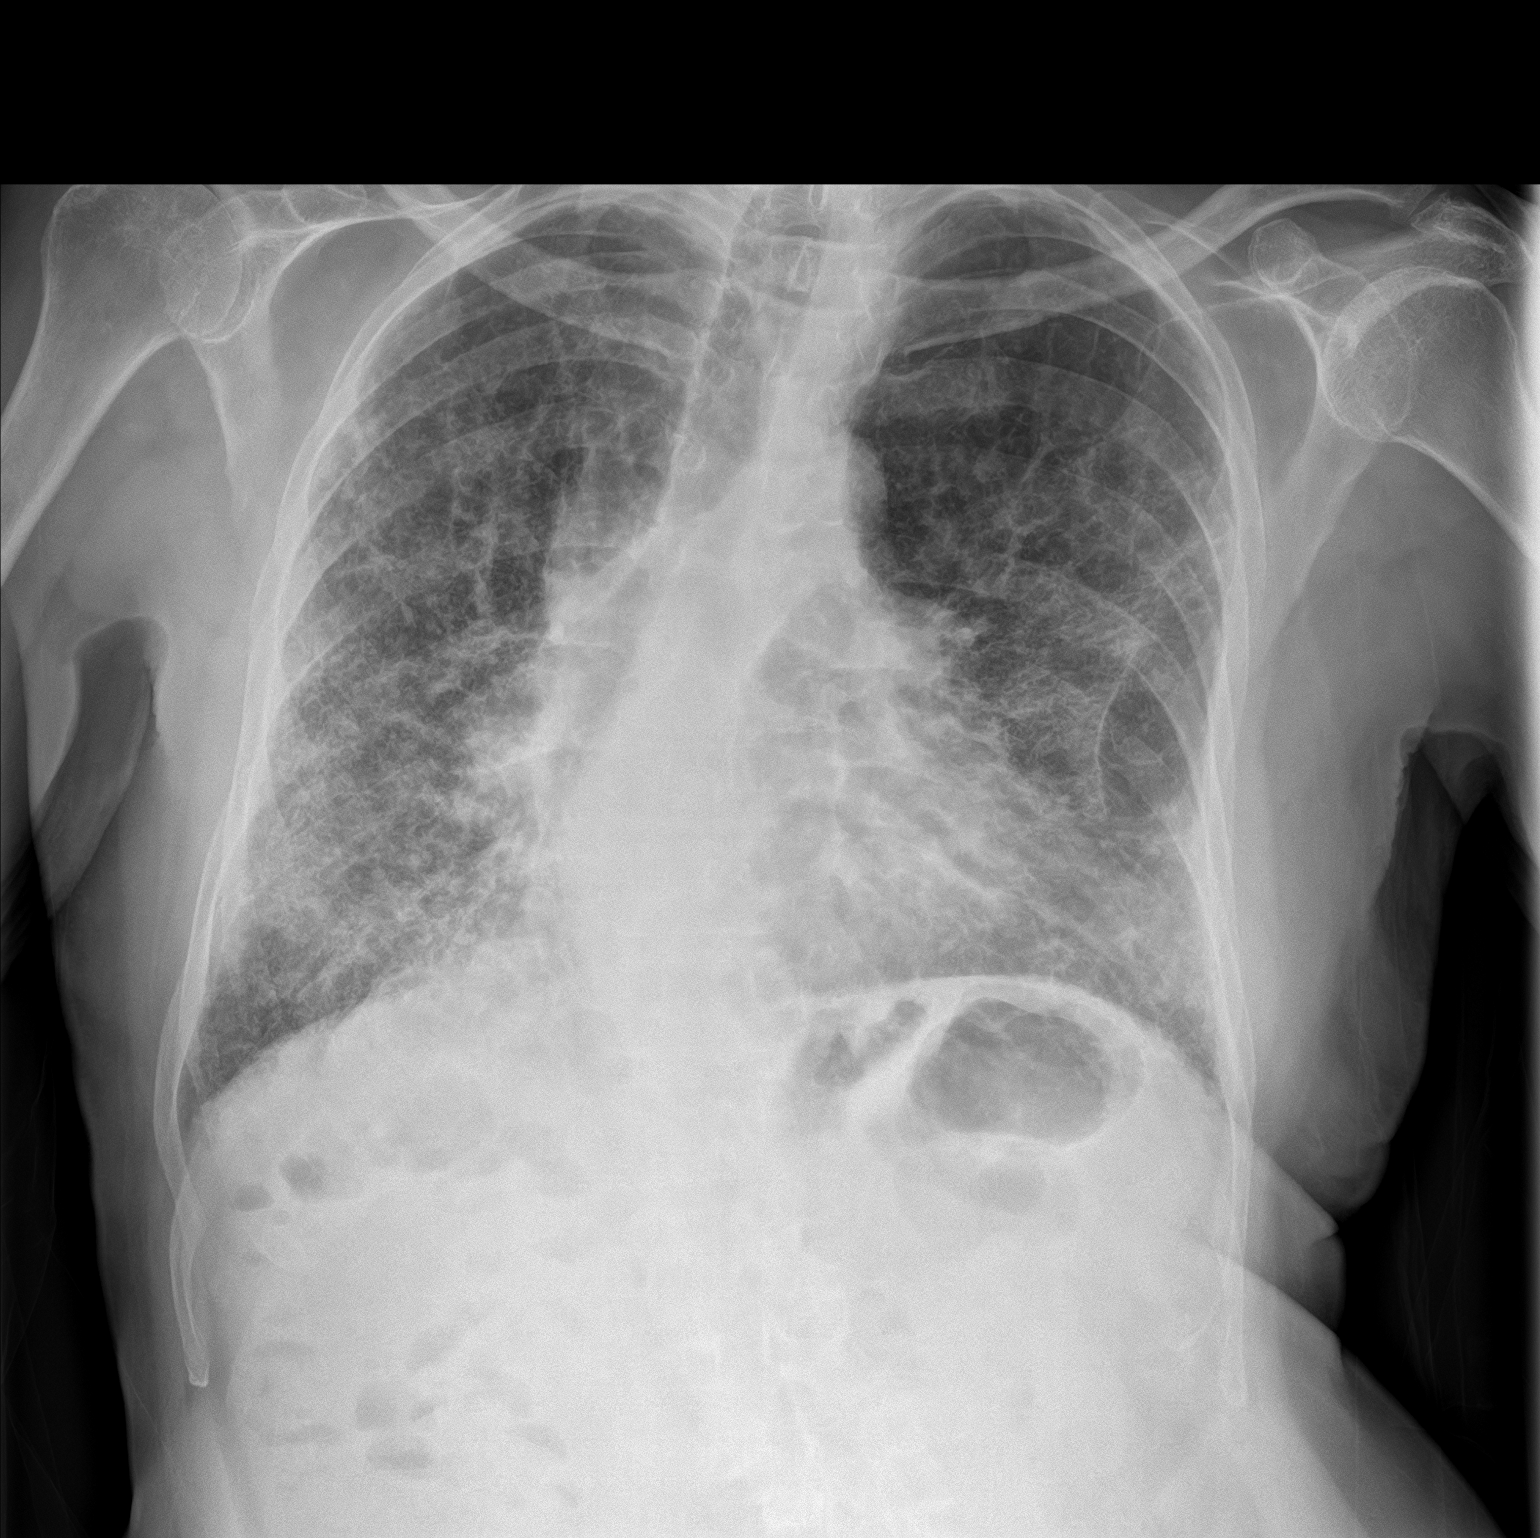
[im 2/2]
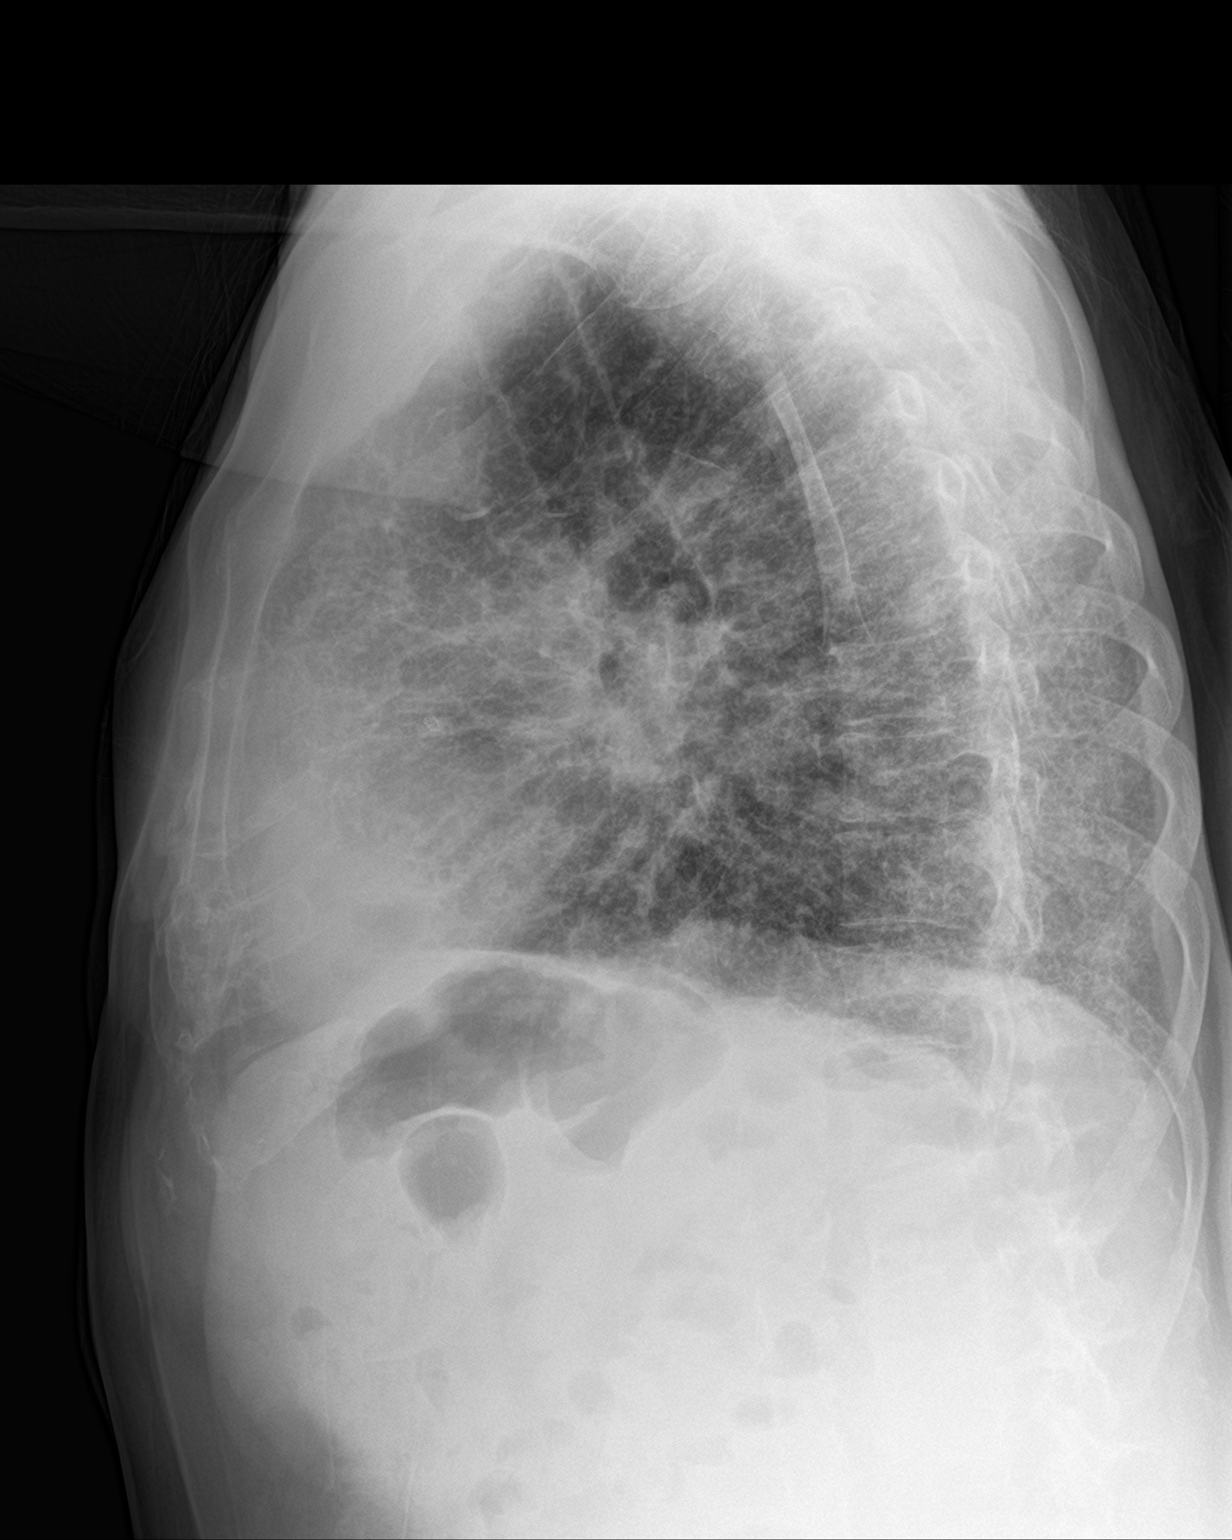

[2 of 2 positions shown; findings below may reference images not displayed]

FINDINGS: The heart size and pulmonary vascularity are normal. Extensive
changes of pulmonary fibrosis appears stable. No effusions or
pulmonary edema.

Aortic atherosclerosis.  Chronic thoracolumbar scoliosis.
IMPRESSION: No acute abnormalities.  Extensive pulmonary fibrosis.

Aortic atherosclerosis.

## 2018-03-23 ENCOUNTER — Other Ambulatory Visit: Payer: Self-pay | Admitting: Internal Medicine

## 2018-03-24 ENCOUNTER — Encounter: Payer: Self-pay | Admitting: Internal Medicine

## 2018-03-24 NOTE — Telephone Encounter (Signed)
Can you send [place]

## 2018-03-26 ENCOUNTER — Other Ambulatory Visit: Payer: Self-pay | Admitting: Nurse Practitioner

## 2018-03-26 DIAGNOSIS — F5101 Primary insomnia: Secondary | ICD-10-CM

## 2018-03-26 MED ORDER — ZOLPIDEM TARTRATE 10 MG PO TABS: 10.0000 mg | ORAL_TABLET | Freq: Every evening | ORAL | 1 refills | Status: DC | PRN

## 2018-03-26 NOTE — Progress Notes (Signed)
Sent new, 90 day prescription of ambien 10mg  QHS prn to [facility].

## 2018-04-06 ENCOUNTER — Encounter: Payer: Self-pay | Admitting: Nurse Practitioner

## 2018-04-06 ENCOUNTER — Inpatient Hospital Stay (HOSPITAL_COMMUNITY): Payer: Medicare Other

## 2018-04-06 ENCOUNTER — Inpatient Hospital Stay (HOSPITAL_COMMUNITY)
Admission: EM | Admit: 2018-04-06 | Discharge: 2018-04-08 | DRG: 243 | Disposition: A | Discharge status: Home Health | Payer: Medicare Other | Attending: Cardiovascular Disease | Admitting: Cardiovascular Disease

## 2018-04-06 ENCOUNTER — Telehealth: Payer: Self-pay | Admitting: Cardiovascular Disease

## 2018-04-06 ENCOUNTER — Encounter (HOSPITAL_COMMUNITY): Payer: Self-pay | Admitting: Emergency Medicine

## 2018-04-06 ENCOUNTER — Other Ambulatory Visit: Payer: Self-pay

## 2018-04-06 ENCOUNTER — Ambulatory Visit (INDEPENDENT_AMBULATORY_CARE_PROVIDER_SITE_OTHER): Payer: Medicare Other

## 2018-04-06 ENCOUNTER — Ambulatory Visit: Payer: Medicare Other | Admitting: Nurse Practitioner

## 2018-04-06 VITALS — BP 128/80 | HR 67 | Resp 16 | Ht 67.0 in | Wt 152.0 lb

## 2018-04-06 VITALS — BP 122/60 | HR 60 | Ht 66.0 in | Wt 151.0 lb

## 2018-04-06 DIAGNOSIS — Z9842 Cataract extraction status, left eye: Secondary | ICD-10-CM

## 2018-04-06 DIAGNOSIS — J8410 Pulmonary fibrosis, unspecified: Secondary | ICD-10-CM | POA: Diagnosis present

## 2018-04-06 DIAGNOSIS — G4733 Obstructive sleep apnea (adult) (pediatric): Secondary | ICD-10-CM | POA: Diagnosis present

## 2018-04-06 DIAGNOSIS — R001 Bradycardia, unspecified: Secondary | ICD-10-CM

## 2018-04-06 DIAGNOSIS — Z9841 Cataract extraction status, right eye: Secondary | ICD-10-CM | POA: Diagnosis not present

## 2018-04-06 DIAGNOSIS — N400 Enlarged prostate without lower urinary tract symptoms: Secondary | ICD-10-CM | POA: Diagnosis present

## 2018-04-06 DIAGNOSIS — Z8701 Personal history of pneumonia (recurrent): Secondary | ICD-10-CM | POA: Diagnosis not present

## 2018-04-06 DIAGNOSIS — I2510 Atherosclerotic heart disease of native coronary artery without angina pectoris: Secondary | ICD-10-CM

## 2018-04-06 DIAGNOSIS — Z7951 Long term (current) use of inhaled steroids: Secondary | ICD-10-CM

## 2018-04-06 DIAGNOSIS — I10 Essential (primary) hypertension: Secondary | ICD-10-CM | POA: Diagnosis present

## 2018-04-06 DIAGNOSIS — Z8249 Family history of ischemic heart disease and other diseases of the circulatory system: Secondary | ICD-10-CM

## 2018-04-06 DIAGNOSIS — J9611 Chronic respiratory failure with hypoxia: Secondary | ICD-10-CM

## 2018-04-06 DIAGNOSIS — E785 Hyperlipidemia, unspecified: Secondary | ICD-10-CM | POA: Diagnosis present

## 2018-04-06 DIAGNOSIS — K219 Gastro-esophageal reflux disease without esophagitis: Secondary | ICD-10-CM | POA: Diagnosis present

## 2018-04-06 DIAGNOSIS — Z961 Presence of intraocular lens: Secondary | ICD-10-CM | POA: Diagnosis present

## 2018-04-06 DIAGNOSIS — Z79899 Other long term (current) drug therapy: Secondary | ICD-10-CM

## 2018-04-06 DIAGNOSIS — I442 Atrioventricular block, complete: Secondary | ICD-10-CM | POA: Diagnosis present

## 2018-04-06 DIAGNOSIS — F0280 Dementia in other diseases classified elsewhere without behavioral disturbance: Secondary | ICD-10-CM | POA: Diagnosis present

## 2018-04-06 DIAGNOSIS — Z9981 Dependence on supplemental oxygen: Secondary | ICD-10-CM | POA: Diagnosis not present

## 2018-04-06 DIAGNOSIS — G301 Alzheimer's disease with late onset: Secondary | ICD-10-CM

## 2018-04-06 DIAGNOSIS — Z955 Presence of coronary angioplasty implant and graft: Secondary | ICD-10-CM

## 2018-04-06 DIAGNOSIS — Z974 Presence of external hearing-aid: Secondary | ICD-10-CM

## 2018-04-06 DIAGNOSIS — Z7902 Long term (current) use of antithrombotics/antiplatelets: Secondary | ICD-10-CM | POA: Diagnosis not present

## 2018-04-06 DIAGNOSIS — Z950 Presence of cardiac pacemaker: Secondary | ICD-10-CM

## 2018-04-06 DIAGNOSIS — G309 Alzheimer's disease, unspecified: Secondary | ICD-10-CM | POA: Diagnosis present

## 2018-04-06 DIAGNOSIS — E782 Mixed hyperlipidemia: Secondary | ICD-10-CM

## 2018-04-06 DIAGNOSIS — I441 Atrioventricular block, second degree: Secondary | ICD-10-CM | POA: Diagnosis not present

## 2018-04-06 HISTORY — DX: Presence of cardiac pacemaker: Z95.0

## 2018-04-06 LAB — COMPREHENSIVE METABOLIC PANEL
ALT: 18 U/L (ref 0–44)
ALT: 16 U/L (ref 0–44)
AST: 28 U/L (ref 15–41)
AST: 26 U/L (ref 15–41)
Albumin: 3.5 g/dL (ref 3.5–5.0)
Albumin: 3.2 g/dL — ABNORMAL LOW (ref 3.5–5.0)
Alkaline Phosphatase: 59 U/L (ref 38–126)
Alkaline Phosphatase: 54 U/L (ref 38–126)
Anion gap: 10 (ref 5–15)
Anion gap: 8 (ref 5–15)
BILIRUBIN TOTAL: 0.3 mg/dL (ref 0.3–1.2)
BUN: 20 mg/dL (ref 8–23)
BUN: 20 mg/dL (ref 8–23)
CO2: 25 mmol/L (ref 22–32)
CO2: 24 mmol/L (ref 22–32)
CREATININE: 1 mg/dL (ref 0.61–1.24)
Calcium: 8.8 mg/dL — ABNORMAL LOW (ref 8.9–10.3)
Calcium: 8.4 mg/dL — ABNORMAL LOW (ref 8.9–10.3)
Chloride: 106 mmol/L (ref 98–111)
Chloride: 108 mmol/L (ref 98–111)
Creatinine, Ser: 1.01 mg/dL (ref 0.61–1.24)
GFR calc Af Amer: >60 mL/min (ref >60)
GFR calc Af Amer: >60 mL/min (ref >60)
GFR calc non Af Amer: >60 mL/min (ref >60)
Glucose, Bld: 95 mg/dL (ref 70–99)
Glucose, Bld: 110 mg/dL — ABNORMAL HIGH (ref 70–99)
Potassium: 3.8 mmol/L (ref 3.5–5.1)
Potassium: 3.9 mmol/L (ref 3.5–5.1)
Sodium: 141 mmol/L (ref 135–145)
Sodium: 140 mmol/L (ref 135–145)
TOTAL PROTEIN: 8 g/dL (ref 6.5–8.1)
Total Bilirubin: 0.5 mg/dL (ref 0.3–1.2)
Total Protein: 7.3 g/dL (ref 6.5–8.1)

## 2018-04-06 LAB — CBC WITH DIFFERENTIAL/PLATELET
Abs Immature Granulocytes: 0.01 10*3/uL (ref 0.00–0.07)
BASOS PCT: 1 %
Basophils Absolute: 0.1 10*3/uL (ref 0.0–0.1)
Eosinophils Absolute: 0.3 10*3/uL (ref 0.0–0.5)
Eosinophils Relative: 5 %
HCT: 41.8 % (ref 39.0–52.0)
Hemoglobin: 13.3 g/dL (ref 13.0–17.0)
Immature Granulocytes: 0 %
Lymphocytes Relative: 26 %
Lymphs Abs: 1.6 10*3/uL (ref 0.7–4.0)
MCH: 29.3 pg (ref 26.0–34.0)
MCHC: 31.8 g/dL (ref 30.0–36.0)
MCV: 92.1 fL (ref 80.0–100.0)
Monocytes Absolute: 0.6 10*3/uL (ref 0.1–1.0)
Monocytes Relative: 9 %
Neutro Abs: 3.7 10*3/uL (ref 1.7–7.7)
Neutrophils Relative %: 59 %
PLATELETS: 168 10*3/uL (ref 150–400)
RBC: 4.54 MIL/uL (ref 4.22–5.81)
RDW: 13.6 % (ref 11.5–15.5)
WBC: 6.2 10*3/uL (ref 4.0–10.5)
nRBC: 0 % (ref 0.0–0.2)

## 2018-04-06 LAB — GLUCOSE, CAPILLARY: Glucose-Capillary: 109 mg/dL — ABNORMAL HIGH (ref 70–99)

## 2018-04-06 LAB — CBC
HCT: 43.8 % (ref 39.0–52.0)
Hemoglobin: 14.1 g/dL (ref 13.0–17.0)
MCH: 30.2 pg (ref 26.0–34.0)
MCHC: 32.2 g/dL (ref 30.0–36.0)
MCV: 93.8 fL (ref 80.0–100.0)
Platelets: 201 10*3/uL (ref 150–400)
RBC: 4.67 MIL/uL (ref 4.22–5.81)
RDW: 13.7 % (ref 11.5–15.5)
WBC: 6.6 10*3/uL (ref 4.0–10.5)
nRBC: 0 % (ref 0.0–0.2)

## 2018-04-06 LAB — TSH: TSH: 1.767 u[IU]/mL (ref 0.350–4.500)

## 2018-04-06 LAB — MAGNESIUM
MAGNESIUM: 2.1 mg/dL (ref 1.7–2.4)
Magnesium: 2.1 mg/dL (ref 1.7–2.4)

## 2018-04-06 LAB — TROPONIN I: Troponin I: <0.03 ng/mL (ref <0.03)

## 2018-04-06 LAB — PHOSPHORUS: Phosphorus: 3.6 mg/dL (ref 2.5–4.6)

## 2018-04-06 MED ORDER — PANTOPRAZOLE SODIUM 40 MG PO TBEC: 40.0000 mg | DELAYED_RELEASE_TABLET | Freq: Every day | ORAL | Status: DC | PRN

## 2018-04-06 MED ORDER — FINASTERIDE 5 MG PO TABS
5.0000 mg | ORAL_TABLET | Freq: Every day | ORAL | Status: DC
  Administered 2018-04-07: 5 mg via ORAL
  Filled 2018-04-06: qty 1

## 2018-04-06 MED ORDER — NITROGLYCERIN 0.4 MG SL SUBL: 0.4000 mg | SUBLINGUAL_TABLET | SUBLINGUAL | Status: DC | PRN

## 2018-04-06 MED ORDER — ZOLPIDEM TARTRATE 5 MG PO TABS
5.0000 mg | ORAL_TABLET | Freq: Every evening | ORAL | Status: DC | PRN
  Administered 2018-04-07: 5 mg via ORAL
  Filled 2018-04-06: qty 1

## 2018-04-06 MED ORDER — ROSUVASTATIN CALCIUM 20 MG PO TABS
40.0000 mg | ORAL_TABLET | Freq: Every day | ORAL | Status: DC
  Administered 2018-04-06 – 2018-04-07 (×2): 40 mg via ORAL
  Filled 2018-04-06 (×2): qty 2

## 2018-04-06 MED ORDER — BUDESONIDE 0.5 MG/2ML IN SUSP
0.5000 mg | Freq: Two times a day (BID) | RESPIRATORY_TRACT | Status: DC
  Administered 2018-04-06 – 2018-04-08 (×4): 0.5 mg via RESPIRATORY_TRACT
  Filled 2018-04-06 (×4): qty 2

## 2018-04-06 MED ORDER — ASPIRIN EC 81 MG PO TBEC
81.0000 mg | DELAYED_RELEASE_TABLET | Freq: Every day | ORAL | Status: DC
  Administered 2018-04-07: 81 mg via ORAL
  Filled 2018-04-06: qty 1

## 2018-04-06 MED ORDER — ENOXAPARIN SODIUM 40 MG/0.4ML ~~LOC~~ SOLN
40.0000 mg | SUBCUTANEOUS | Status: DC
  Administered 2018-04-06 – 2018-04-07 (×2): 40 mg via SUBCUTANEOUS
  Filled 2018-04-06 (×2): qty 0.4

## 2018-04-06 MED ORDER — ACETAMINOPHEN 325 MG PO TABS: 650.0000 mg | ORAL_TABLET | ORAL | Status: DC | PRN

## 2018-04-06 MED ORDER — CLOPIDOGREL BISULFATE 75 MG PO TABS
75.0000 mg | ORAL_TABLET | Freq: Every day | ORAL | Status: DC
  Administered 2018-04-06 – 2018-04-07 (×2): 75 mg via ORAL
  Filled 2018-04-06 (×2): qty 1

## 2018-04-06 MED ORDER — INSULIN ASPART 100 UNIT/ML ~~LOC~~ SOLN: 0.0000 [IU] | Freq: Three times a day (TID) | SUBCUTANEOUS | Status: DC

## 2018-04-06 MED ORDER — RIVASTIGMINE 9.5 MG/24HR TD PT24
9.5000 mg | MEDICATED_PATCH | Freq: Every day | TRANSDERMAL | Status: DC
  Administered 2018-04-07 – 2018-04-08 (×2): 9.5 mg via TRANSDERMAL
  Filled 2018-04-06 (×3): qty 1

## 2018-04-06 MED ORDER — PIRFENIDONE 267 MG PO CAPS
801.0000 mg | ORAL_CAPSULE | Freq: Three times a day (TID) | ORAL | Status: DC
  Administered 2018-04-07 – 2018-04-08 (×3): 801 mg via ORAL
  Filled 2018-04-06 (×7): qty 3

## 2018-04-06 MED ORDER — ONDANSETRON HCL 4 MG/2ML IJ SOLN: 4.0000 mg | Freq: Four times a day (QID) | INTRAMUSCULAR | Status: DC | PRN

## 2018-04-06 MED ORDER — MOMETASONE FURO-FORMOTEROL FUM 100-5 MCG/ACT IN AERO
2.0000 | INHALATION_SPRAY | Freq: Two times a day (BID) | RESPIRATORY_TRACT | Status: DC
  Administered 2018-04-07 – 2018-04-08 (×2): 2 via RESPIRATORY_TRACT
  Filled 2018-04-06: qty 8.8

## 2018-04-06 NOTE — Telephone Encounter (Signed)
STAT if HR is under 50 or over 120 (normal HR is 60-100 beats per minute)  1) What is your heart rate? ~40  2) Do you have a log of your heart rate readings (document readings)? Fluctuating between 40 to 50s  3) Do you have any other symptoms? SOB, pulse ox is in mid 80s   Patient would like to be seen ASAP

## 2018-04-06 NOTE — ED Triage Notes (Signed)
Pt presents with report of heart rate in 30s, sent by cardiologist.  Pt reports shortness of breath, especially with walking.  Pt denies any cough, BLE swelling or chest discomfort.

## 2018-04-06 NOTE — Telephone Encounter (Signed)
Call placed to the patient to check on him. He was currently at Dr. Milta Deiters office, pulmonologist, waiting to be seen.

## 2018-04-06 NOTE — Telephone Encounter (Signed)
Patient called in stating that his oxygen level was in the mid 80's and his heart rate was in the 40's. He stated that he currently was on 6 liters of oxygen and wanted to come into the office to be seen. He was been put on the schedule for today at 2 pm but has been advised to go to the ED since his oxygen level was in the mid 80's on 6 liters and he was audibly short of breath.   He stated that he was currently in his car and was not sure that the portable oxygen concentrator was functioning correctly. He was going to go home and try the concentrator at home. He has been advised if it is still low to go to the ED for further assessment.

## 2018-04-06 NOTE — H&P (Addendum)
Cardiology Clinic Note   Patient Name: Vincent Kennedy Date of Encounter: 04/06/2018  [facility] [place] Primary Cardiologist:  Lorine BearsMuhammad Arida, MD  Patient Profile    85 year old male with a history of coronary artery disease, pulmonary fibrosis, hypertension, hyperlipidemia, and sleep apnea, who presents for evaluation today secondary to bradycardia and complete heart block.  Past Medical History    Past Medical History:  Diagnosis Date  . BPH (benign prostatic hyperplasia)   . Coronary artery disease    a. 11/2006 Cath: D1 99ost (2.5x15 Xience DES), D2 90ost (small); b. 12/2017 PCI: LAD 95 (3.25x8 & 3.0x 33 Sierra DES'. D1 patent stent. D2 30ost. EF 55-65%;  c. 08/2017 Cath: LM min irregs, LAD 40ost, patent stents, D1 patent stent, LCX nl, OM1/2/3 nl, RCA 30ost, RPDA/RPAV/RPL1/2/3 nl.  . DOE (dyspnea on exertion)    a. Chronic supplemental O2 in setting of pulm fibrosis; b. 10/2017 Echo: Nl LV size/fxn. Mild Ao sclerosis. Trace to mild TR.  Vincent Kennedy. GERD (gastroesophageal reflux disease)   . Hyperlipidemia   . Hypertension   . On home oxygen therapy    "2-3L; 24/7" (01/19/2017)  . OSA on CPAP   . Oxygen deficiency   . Pneumonia 07/2016  . Pulmonary fibrosis (HCC) 08/26/2015  . Wears hearing aid    (sometimes)   Past Surgical History:  Procedure Laterality Date  . CATARACT EXTRACTION W/PHACO Right 01/29/2015   Procedure: CATARACT EXTRACTION PHACO AND INTRAOCULAR LENS PLACEMENT (IOC);  Surgeon: [name], MD;  [facility];  Service: Ophthalmology;  Laterality: Right;  RESTOR LENS CPAP  . CATARACT EXTRACTION W/PHACO Left 03/12/2015   Procedure: CATARACT EXTRACTION PHACO AND INTRAOCULAR LENS PLACEMENT (IOC);  Surgeon: [name], MD;  [facility];  Service: Ophthalmology;  Laterality: Left;  RESTOR SHUGARCAINE  . COLONOSCOPY WITH ESOPHAGOGASTRODUODENOSCOPY (EGD)    . CORONARY ANGIOPLASTY WITH  STENT PLACEMENT  2010   "1 stent"  . CORONARY ANGIOPLASTY WITH STENT PLACEMENT  01/19/2017  . CORONARY ATHERECTOMY N/A 01/19/2017   Procedure: CORONARY ATHERECTOMY - CSI;  Surgeon: [name], Muhammad A, MD;  Location: [facility];  Service: Cardiovascular;  Laterality: N/A;  unable to cross lesion  . CORONARY STENT INTERVENTION N/A 01/19/2017   Procedure: CORONARY STENT INTERVENTION;  Surgeon: [name], Muhammad A, MD;  Location: [facility];  Service: Cardiovascular;  Laterality: N/A;  . INGUINAL HERNIA REPAIR Bilateral   . LEFT HEART CATH AND CORONARY ANGIOGRAPHY Left 09/12/2017   Procedure: LEFT HEART CATH AND CORONARY ANGIOGRAPHY;  Surgeon: [name], Muhammad A, MD;  Location: [facility];  Service: Cardiovascular;  Laterality: Left;  . RIGHT/LEFT HEART CATH AND CORONARY ANGIOGRAPHY N/A 01/12/2017   Procedure: RIGHT/LEFT HEART CATH AND CORONARY ANGIOGRAPHY;  Surgeon: [name], Muhammad A, MD;  Location: [facility];  Service: Cardiovascular;  Laterality: N/A;    Allergies  No Known Allergies  History of Present Illness    85 year old male with the above past medical history including coronary artery disease, pulmonary fibrosis on home O2, hypertension, hyperlipidemia, and sleep apnea.  Cardiac history dates back to 2008, when he underwent drug-eluting stent placement to the first diagonal.  In October 2018, he developed worsening exertional dyspnea and was evaluated with diagnostic catheterization which revealed a 95% heavily calcified stenosis in the mid LAD at the origin of the second diagonal.  The LAD was successfully intervened upon with 2 drug-eluting stents.  He had recurrent worsening dyspnea in  June 2019.  Diagnostic catheterization showed patent LAD and diagonal stents with otherwise nonobstructive disease.  Medical therapy was recommended.  A follow-up echocardiogram in August 2019 performed through his primary care provider's office showed normal LV  function.  In the setting of pulmonary fibrosis, he has been on home O2 at 6 L/min.  Oxygen saturations typically run in the low to mid 90s and sometimes higher after inhaler therapy.  He does not typically experience chest pain though does have some degree of chronic dyspnea on exertion.  This morning, he noted mild dyspnea at rest and checked his pulse ox and found his saturation to be in the 80s with a heart rate in the mid 30s.  This lasted for a few seconds and then heart rate came back into the 60s with saturations coming up into the 90s.  He spoke to Dr. [name] via phone and was advised to present to the office for evaluation and probable Holter monitoring.  Prior to our visit, patient was first seen by his primary care provider.  There, he again had a drop in heart rate into the 30s on his home pulse oximeter but by the time an ECG was performed, his heart rate was stable at 61 bpm.  On arrival to our office, he pointed out on his home pulse oximeter that his heart rate had dropped into the 30s.  By the time we got our pulse oximeter on him, both pulse oximeter is showed heart rate in the 60s.  While I was examining him, it occurred again with heart rates in the 30s on his home pulse oximeter.  I auscultated his heart and noted bradycardia.  At that point, we were able to place him on a 12-lead rhythm strip and were able to document bradycardia with intermittent 2-1 heart block and also A-V dissociation with junctional escape.  During these episodes, he has been asymptomatic.  He denies chest pain, palpitations, presyncope.  He has not had any recent syncope.  He further denies PND, orthopnea, edema, or early satiety.  Home Medications    Prior to Admission medications   Medication Sig Start Date End Date Taking? Authorizing Provider  ACCU-CHEK FASTCLIX LANCETS MISC Use as directed twice day prn diag e11.65 01/25/18  Yes Lyndon Code, MD  budesonide (PULMICORT) 0.5 MG/2ML nebulizer solution USE 1  VIAL VIA NEBULIZER  TWO TIMES DAILY 03/01/18  Yes Johnna Acosta, NP  clopidogrel (PLAVIX) 75 MG tablet TAKE 1 TABLET BY MOUTH  DAILY 03/01/18  Yes Iran Ouch, MD  finasteride (PROSCAR) 5 MG tablet TAKE 1 TABLET BY MOUTH  DAILY 03/01/18  Yes [name] A, MD  glucose blood (ACCU-CHEK GUIDE) test strip Use as instructed twice a day prn diag e11.65 01/25/18  Yes Lyndon Code, MD  mometasone-formoterol (DULERA) 100-5 MCG/ACT AERO Inhale 2 puffs into the lungs 2 (two) times daily. 02/09/18  Yes Yevonne Pax, MD  nitroGLYCERIN (NITROSTAT) 0.4 MG SL tablet Place 1 tablet (0.4 mg total) under the tongue every 5 (five) minutes as needed for chest pain. 01/20/17  Yes Bhagat, Bhavinkumar, PA  pantoprazole (PROTONIX) 40 MG tablet Take 1 tablet (40 mg total) by mouth daily as needed (heartburn). 01/12/18 01/12/19 Yes Iran Ouch, MD  Pirfenidone (ESBRIET) 267 MG CAPS Take 801 mg by mouth 3 (three) times daily.    Yes [provider]  rivastigmine (EXELON) 9.5 mg/24hr Place 1 patch (9.5 mg total) onto the skin daily. 09/02/17  Yes Arida,  Chelsea Aus, MD  rosuvastatin (CRESTOR) 40 MG tablet Take 40 mg by mouth every day 05/12/17  Yes Iran Ouch, MD  zolpidem (AMBIEN) 10 MG tablet Take 1 tablet (10 mg total) by mouth at bedtime as needed. 03/26/18  Yes Boscia, Kathlynn Grate, NP  metoprolol tartrate (LOPRESSOR) 25 MG tablet Take 1 tablet (25 mg total) by mouth 2 (two) times daily. 05/11/17 12/01/17  Iran Ouch, MD    Family History    Family History  Problem Relation Age of Onset  . CAD Father   . CAD Brother    He indicated that his mother is deceased. He indicated that his father is deceased. He indicated that his brother is alive.  Social History    Social History   Socioeconomic History  . Marital status: Married    Spouse name: Not on file  . Number of children: Not on file  . Years of education: Not on file  . Highest education level: Not on file  Occupational  History  . Occupation: retired  Engineer, production  . Financial resource strain: Not on file  . Food insecurity:    Worry: Not on file    Inability: Not on file  . Transportation needs:    Medical: Not on file    Non-medical: Not on file  Tobacco Use  . Smoking status: Never Smoker  . Smokeless tobacco: Never Used  Substance and Sexual Activity  . Alcohol use: No    Alcohol/week: 0.0 standard drinks  . Drug use: No  . Sexual activity: Yes    Comment: patient states minor  Lifestyle  . Physical activity:    Days per week: Not on file    Minutes per session: Not on file  . Stress: Not on file  Relationships  . Social connections:    Talks on phone: Not on file    Gets together: Not on file    Attends religious service: Not on file    Active member of club or organization: Not on file    Attends meetings of clubs or organizations: Not on file    Relationship status: Not on file  . Intimate partner violence:    Fear of current or ex partner: Not on file    Emotionally abused: Not on file    Physically abused: Not on file    Forced sexual activity: Not on file  Other Topics Concern  . Not on file  Social History Narrative   Retired Development worker, international aid. Lives in [place] with wife.     Review of Systems    General:  No chills, fever, night sweats or weight changes.  Cardiovascular:  No chest pain, +++ chronic dyspnea on exertion, no edema, orthopnea, palpitations, paroxysmal nocturnal dyspnea. Dermatological: No rash, lesions/masses Respiratory: No cough, +++ dyspnea Urologic: No hematuria, dysuria Abdominal:   No nausea, vomiting, diarrhea, bright red blood per rectum, melena, or hematemesis Neurologic:  No visual changes, wkns, changes in mental status. All other systems reviewed and are otherwise negative except as noted above.  Physical Exam    VS:  BP 122/60 (BP Location: Left Arm, Patient Position: Sitting, Cuff Size: Normal)   Pulse 60   Ht 5\' 6"  (1.676 m)   Wt 151  lb (68.5 kg)   SpO2 90%   BMI 24.37 kg/m  , BMI Body mass index is 24.37 kg/m. GEN: Well nourished, well developed, in no acute distress. HEENT: normal. Neck: Supple, no JVD, carotid bruits, or masses. Cardiac:  RRR, no murmurs, rubs, or gallops. No clubbing, cyanosis, edema.  Radials/PT 1+ and equal bilaterally.  Respiratory:  Respirations regular and unlabored, diminished breath sounds with bibasilar crackles. GI: Soft, nontender, nondistended, BS + x 4. MS: no deformity or atrophy. Skin: warm and dry, no rash. Neuro:  Strength and sensation are intact. Psych: Normal affect.  Accessory Clinical Findings    ECG personally reviewed by me today-sinus bradycardia, 35, 2-1 heart block, left axis deviation, left anterior fascicular block, left atrial enlargement.  Rhythm strip shows intermittent A-V dissociation with junctional escapes.  Assessment & Plan   1.  Complete heart block/bradycardia: Patient noted bradycardia on his home pulse oximeter today associated with mild dyspnea and drops in oxygen saturation.  He presented to the office for evaluation and during my exam, I noted bradycardia that coincided with alarming of his home pulse oximeter with rates in the 30s.  We obtained a 12-lead rhythm strip and ECG.  ECG initially showed 2-1 heart block at a rate of 35 but ongoing rhythm strip evaluation showed intermittent A-V dissociation suggestive of third-degree heart block.  He is on beta-blocker therapy-metoprolol 25 mg twice daily.  His last dose was this morning.  I did discuss his case with Drs. Gollan and Arida and we have collectively decided that he would benefit most from hospital admission for beta-blocker washout and ongoing telemetry monitoring.  If heart block persists despite beta-blocker washout, patient understands that he will require permanent pacemaker placement.  As he is asymptomatic, he prefers to have his [facility] drive [facility].  We will have our team see him over  there and admit him with EP seeing him in the a.m.  2.  Coronary artery disease: Status post prior PCI's of the LAD and diagonal with most recent catheterization in June 2019 showing patent stents and otherwise nonobstructive disease.  He had normal LV function by echo in August 2019.  He has not been experiencing any chest pain.  No ischemic changes on ECG.  Holding beta-blocker as above.  Continue Plavix, aspirin, and statin therapy.  3.  Essential hypertension: Stable.  Hold beta-blocker.  4.  Hyperlipidemia: LDL was 73 in 2018.  Continue statin therapy.  5.  Pulmonary fibrosis: [facility].  Continue inhalers and oxygen therapy.  Nicolasa Duckinghristopher Berge, NP 04/06/2018, 4:21 PM   I have seen and examined the patient along with Nicolasa Duckinghristopher Berge, NP.  I have reviewed the chart, notes and new data.  I agree with PA/NP's note.  Key new complaints: shortness of breath is at baseline (at least at rest). While talking to him, he goes in and out of 2:1 AV block and appears unaware of the change. Key examination changes: bibasilar dry Velcro-like crackles, intermittent bradycardia. Key new findings / data: during AV block, there is always narrow QRS-complex, consistent with primarily suprahisian block.  PLAN: As long as he is asymptomatic, he does not need a temporary pacemaker. Observe off beta blockers for at least 48 hours before deciding whether he needs a permanent dual chamber pacemaker.  Rivastigmine might also be playing a less significant role in his bradycardia. According to the patient and his wife, it does convey some improvement in his short term memory.  Thurmon FairMihai Nishita Isaacks, MD, [facility] [phone] 04/06/2018, 6:00 PM

## 2018-04-06 NOTE — ED Provider Notes (Signed)
[facility] EMERGENCY DEPARTMENT Provider Note   CSN: [id number] Arrival date & time: 04/06/18  1630     History   Chief Complaint Chief Complaint  Patient presents with  . Shortness of Breath  . Bradycardia    HPI Vincent Kennedy is a 85 y.o. male.  HPI 85 year old male presents the emergency department from cardiology clinic where he is being seen and evaluated for new bradycardia.  Patient reports over the past week he has had increasing intermittent exertional shortness of breath and feeling weak and tired.  No syncope.  He has a history of pulmonary fibrosis with home 6 L nasal cannula oxygen use.  He denies fevers and chills.  Denies productive cough.  No unilateral leg swelling.  He does have a history of coronary artery disease.  He denies chest pain.  He is on beta-blockers.  He was seen at the cardiology office today and had several episodes of bradycardia and was found to be in 2-1 heart block.  It was felt as though he would benefit from admission and was sent to the emergency department for evaluation and admission.  His wife drove him.  He has had 2 of these events while I was in the room.  He states when he develops these bradycardic episodes he becomes more short of breath and with his home pulse ox is found himself to be hypoxic.  Currently he is on 6 L nasal cannula satting 94% with a heart rate of 62 at time of my initial evaluation.     Past Medical History:  Diagnosis Date  . BPH (benign prostatic hyperplasia)   . Coronary artery disease    a. 11/2006 Cath: D1 99ost (2.5x15 Xience DES), D2 90ost (small); b. 12/2017 PCI: LAD 95 (3.25x8 & 3.0x 33 Sierra DES'. D1 patent stent. D2 30ost. EF 55-65%;  c. 08/2017 Cath: LM min irregs, LAD 40ost, patent stents, D1 patent stent, LCX nl, OM1/2/3 nl, RCA 30ost, RPDA/RPAV/RPL1/2/3 nl.  . DOE (dyspnea on exertion)    a. Chronic supplemental O2 in setting of pulm fibrosis; b. 10/2017 Echo: Nl LV size/fxn. Mild  Ao sclerosis. Trace to mild TR.  [name]. GERD (gastroesophageal reflux disease)   . Hyperlipidemia   . Hypertension   . On home oxygen therapy    "2-3L; 24/7" (01/19/2017)  . OSA on CPAP   . Oxygen deficiency   . Pneumonia 07/2016  . Pulmonary fibrosis (HCC) 08/26/2015  . Wears hearing aid    (sometimes)    Patient Active Problem List   Diagnosis Date Noted  . Complete heart block (HCC) 04/06/2018  . Effort angina (HCC) 01/19/2017  . CAD S/P percutaneous coronary angioplasty 11/25/2016  . Cataracts, bilateral 11/25/2016  . Chronic hypoxemic respiratory failure (HCC) 11/25/2016  . GERD (gastroesophageal reflux disease) 11/25/2016  . Inguinal hernia 11/25/2016  . OSA on CPAP 11/25/2016  . Urinary retention   . Pneumonia 08/15/2016  . Need for vaccination for Strep pneumoniae 04/28/2016  . Incomplete emptying of bladder 09/10/2015  . Pulmonary fibrosis (HCC) 08/26/2015  . Medication monitoring encounter 08/26/2015  . Elevated antinuclear antibody (ANA) level 04/11/2015  . Dyspnea 03/18/2015  . Coronary artery disease involving native coronary artery of native heart without angina pectoris 10/16/2014  . Auditory disturbance 10/16/2014  . Hypertension goal BP (blood pressure) < 150/90 10/16/2014  . Alzheimer's dementia without behavioral disturbance (HCC) 10/16/2014  . Status post insertion of drug eluting coronary artery stent 10/16/2014  . Medicare annual wellness  visit, subsequent 10/16/2014  . Coronary artery disease   . Hyperlipidemia     Past Surgical History:  Procedure Laterality Date  . CATARACT EXTRACTION W/PHACO Right 01/29/2015   Procedure: CATARACT EXTRACTION PHACO AND INTRAOCULAR LENS PLACEMENT (IOC);  Surgeon: Lockie Mola, MD;  Location: [facility];  Service: Ophthalmology;  Laterality: Right;  RESTOR LENS CPAP  . CATARACT EXTRACTION W/PHACO Left 03/12/2015   Procedure: CATARACT EXTRACTION PHACO AND INTRAOCULAR LENS PLACEMENT (IOC);  Surgeon: Lockie Mola, MD;  Location: [facility];  Service: Ophthalmology;  Laterality: Left;  RESTOR SHUGARCAINE  . COLONOSCOPY WITH ESOPHAGOGASTRODUODENOSCOPY (EGD)    . CORONARY ANGIOPLASTY WITH STENT PLACEMENT  2010   "1 stent"  . CORONARY ANGIOPLASTY WITH STENT PLACEMENT  01/19/2017  . CORONARY ATHERECTOMY N/A 01/19/2017   Procedure: CORONARY ATHERECTOMY - CSI;  Surgeon: Iran Ouch, MD;  [facility]: [facility];  Service: Cardiovascular;  Laterality: N/A;  unable to cross lesion  . CORONARY STENT INTERVENTION N/A 01/19/2017   Procedure: CORONARY STENT INTERVENTION;  Surgeon: Iran Ouch, MD;  Location: [facility];  Service: Cardiovascular;  Laterality: N/A;  . INGUINAL HERNIA REPAIR Bilateral   . LEFT HEART CATH AND CORONARY ANGIOGRAPHY Left 09/12/2017   Procedure: LEFT HEART CATH AND CORONARY ANGIOGRAPHY;  Surgeon: Iran Ouch, MD;  Location: [facility] IN[facility] CV [facility];  Service: Cardiovascular;  Laterality: Left;  . RIGHT/LEFT HEART CATH AND CORONARY ANGIOGRAPHY N/A 01/12/2017   Procedure: RIGHT/LEFT HEART CATH AND CORONARY ANGIOGRAPHY;  Surgeon: Iran Ouch, MD;  Location: [facility];  Service: Cardiovascular;  Laterality: N/A;        Home Medications    Prior to Admission medications   Medication Sig Start Date End Date Taking? Authorizing Provider  ACCU-CHEK FASTCLIX LANCETS MISC Use as directed twice day prn diag e11.65 01/25/18   Lyndon Code, MD  budesonide (PULMICORT) 0.5 MG/2ML nebulizer solution USE 1 VIAL VIA NEBULIZER  TWO TIMES DAILY 03/01/18   Johnna Acosta, NP  clopidogrel (PLAVIX) 75 MG tablet TAKE 1 TABLET BY MOUTH  DAILY 03/01/18   Iran Ouch, MD  finasteride (PROSCAR) 5 MG tablet TAKE 1 TABLET BY MOUTH  DAILY 03/01/18   Iran Ouch, MD  glucose blood (ACCU-CHEK GUIDE) test strip Use as instructed twice a day prn diag e11.65 01/25/18   Lyndon Code, MD  metoprolol tartrate (LOPRESSOR) 25 MG tablet Take 1 tablet  (25 mg total) by mouth 2 (two) times daily. 05/11/17 12/01/17  Iran Ouch, MD  mometasone-formoterol (DULERA) 100-5 MCG/ACT AERO Inhale 2 puffs into the lungs 2 (two) times daily. 02/09/18   Yevonne Pax, MD  nitroGLYCERIN (NITROSTAT) 0.4 MG SL tablet Place 1 tablet (0.4 mg total) under the tongue every 5 (five) minutes as needed for chest pain. 01/20/17   Bhagat, Sharrell Ku, PA  pantoprazole (PROTONIX) 40 MG tablet Take 1 tablet (40 mg total) by mouth daily as needed (heartburn). 01/12/18 01/12/19  Iran Ouch, MD  Pirfenidone (ESBRIET) 267 MG CAPS Take 801 mg by mouth 3 (three) times daily.     [provider]  rivastigmine (EXELON) 9.5 mg/24hr Place 1 patch (9.5 mg total) onto the skin daily. 09/02/17   Iran Ouch, MD  rosuvastatin (CRESTOR) 40 MG tablet Take 40 mg by mouth every day 05/12/17   Iran Ouch, MD  zolpidem (AMBIEN) 10 MG tablet Take 1 tablet (10 mg total) by mouth at bedtime as needed. 03/26/18  Carlean JewsBoscia, Heather E, NP    Family History Family History  Problem Relation Age of Onset  . CAD Father   . CAD Brother     Social History Social History   Tobacco Use  . Smoking status: Never Smoker  . Smokeless tobacco: Never Used  Substance Use Topics  . Alcohol use: No    Alcohol/week: 0.0 standard drinks  . Drug use: No     Allergies   Patient has no known allergies.   Review of Systems Review of Systems  All other systems reviewed and are negative.    Physical Exam Updated Vital Signs BP 97/67   Pulse 62   Resp (!) 21   SpO2 100%   Physical Exam Vitals signs and nursing note reviewed.  Constitutional:      Appearance: He is well-developed.  HENT:     Head: Normocephalic and atraumatic.  Neck:     Musculoskeletal: Normal range of motion.  Cardiovascular:     Rate and Rhythm: Normal rate and regular rhythm.     Heart sounds: Normal heart sounds.  Pulmonary:     Effort: Pulmonary effort is normal. No respiratory  distress.     Breath sounds: Normal breath sounds.  Abdominal:     General: There is no distension.     Palpations: Abdomen is soft.     Tenderness: There is no abdominal tenderness.  Musculoskeletal: Normal range of motion.  Skin:    General: Skin is warm and dry.  Neurological:     Mental Status: He is alert and oriented to person, place, and time.  Psychiatric:        Judgment: Judgment normal.      ED Treatments / Results  Labs (all labs ordered are listed, but only abnormal results are displayed) Labs Reviewed - No data to display  EKG EKG Interpretation  Date/Time:  Thursday April 06 2018 16:43:14 EST Ventricular Rate:  60 PR Interval:    QRS Duration: 110 QT Interval:  462 QTC Calculation: 458 R Axis:   -26 Text Interpretation:  Sinus rhythm Left atrial enlargement Borderline left axis deviation Abnormal R-wave progression, early transition Minimal ST elevation, inferior leads Baseline wander in lead(s) V4 No significant change was found Confirmed by Azalia Bilisampos, Eola Waldrep ([id number]) on 04/06/2018 4:56:38 PM   Radiology No results found.  Procedures Procedures (including critical care time)  Medications Ordered in ED Medications - No data to display   Initial Impression / Assessment and Plan / ED Course  I have reviewed the triage vital signs and the nursing notes.  Pertinent labs & imaging results that were available during my care of the patient were reviewed by me and considered in my medical decision making (see chart for details).     Intermittent symptomatic heart block.  Patient will benefit from acute hospitalization and cessation of any AV nodal blocking agents.  If this does not improve he may benefit from permanent pacemaker placement.  Patient will be seen and evaluated by electrophysiology in the morning.  Patient will be admitted to the cardiology service at this time.  Final Clinical Impressions(s) / ED Diagnoses   Final diagnoses:  None    ED  Discharge Orders    None       Azalia Bilisampos, Ilham Roughton, MD 04/06/18 1700

## 2018-04-06 NOTE — ED Triage Notes (Signed)
Pt presents with increased shortness of breath and bradycardia. Denies chest pain. Pt had episode of HR @ 38bpm, and improved back to 60bpm. Denies chest pain or dizziness.

## 2018-04-06 NOTE — Patient Instructions (Signed)
Medication Instructions:  No changes If you need a refill on your cardiac medications before your next appointment, please call your pharmacy.   Lab work: None at this time.  If you have labs (blood work) drawn today and your tests are completely normal, you will receive your results only by: [name] MyChart Message (if you have MyChart) OR . A paper copy in the mail If you have any lab test that is abnormal or we need to change your treatment, we will call you to review the results.  Testing/Procedures: None at this time.  Follow-Up: Please go to [facility] in [place]  Any Other Special Instructions Will Be Listed Below (If Applicable).

## 2018-04-06 NOTE — Progress Notes (Signed)
Cardiology Clinic Note   Patient Name: Vincent Kennedy Date of Encounter: 04/06/2018  [facility] [place] Primary Cardiologist:  Lorine BearsMuhammad Arida, MD  Patient Profile    85 year old male with a history of coronary artery disease, pulmonary fibrosis, hypertension, hyperlipidemia, and sleep apnea, who presents for evaluation today secondary to bradycardia and complete heart block.  Past Medical History    Past Medical History:  Diagnosis Date  . BPH (benign prostatic hyperplasia)   . Coronary artery disease    a. 11/2006 Cath: D1 99ost (2.5x15 Xience DES), D2 90ost (small); b. 12/2017 PCI: LAD 95 (3.25x8 & 3.0x 33 Sierra DES'. D1 patent stent. D2 30ost. EF 55-65%;  c. 08/2017 Cath: LM min irregs, LAD 40ost, patent stents, D1 patent stent, LCX nl, OM1/2/3 nl, RCA 30ost, RPDA/RPAV/RPL1/2/3 nl.  . DOE (dyspnea on exertion)    a. Chronic supplemental O2 in setting of pulm fibrosis; b. 10/2017 Echo: Nl LV size/fxn. Mild Ao sclerosis. Trace to mild TR.  [name]. GERD (gastroesophageal reflux disease)   . Hyperlipidemia   . Hypertension   . On home oxygen therapy    "2-3L; 24/7" (01/19/2017)  . OSA on CPAP   . Oxygen deficiency   . Pneumonia 07/2016  . Pulmonary fibrosis (HCC) 08/26/2015  . Wears hearing aid    (sometimes)   Past Surgical History:  Procedure Laterality Date  . CATARACT EXTRACTION W/PHACO Right 01/29/2015   Procedure: CATARACT EXTRACTION PHACO AND INTRAOCULAR LENS PLACEMENT (IOC);  Surgeon: [name], MD;  [facility];  Service: Ophthalmology;  Laterality: Right;  RESTOR LENS CPAP  . CATARACT EXTRACTION W/PHACO Left 03/12/2015   Procedure: CATARACT EXTRACTION PHACO AND INTRAOCULAR LENS PLACEMENT (IOC);  Surgeon: [name], MD;  [facility];  Service: Ophthalmology;  Laterality: Left;  RESTOR SHUGARCAINE  . COLONOSCOPY WITH ESOPHAGOGASTRODUODENOSCOPY (EGD)    . CORONARY ANGIOPLASTY WITH  STENT PLACEMENT  2010   "1 stent"  . CORONARY ANGIOPLASTY WITH STENT PLACEMENT  01/19/2017  . CORONARY ATHERECTOMY N/A 01/19/2017   Procedure: CORONARY ATHERECTOMY - CSI;  Surgeon: [name], Muhammad A, MD;  Location: [facility];  Service: Cardiovascular;  Laterality: N/A;  unable to cross lesion  . CORONARY STENT INTERVENTION N/A 01/19/2017   Procedure: CORONARY STENT INTERVENTION;  Surgeon: [name], Muhammad A, MD;  Location: [facility];  Service: Cardiovascular;  Laterality: N/A;  . INGUINAL HERNIA REPAIR Bilateral   . LEFT HEART CATH AND CORONARY ANGIOGRAPHY Left 09/12/2017   Procedure: LEFT HEART CATH AND CORONARY ANGIOGRAPHY;  Surgeon: [name], Muhammad A, MD;  Location: [facility];  Service: Cardiovascular;  Laterality: Left;  . RIGHT/LEFT HEART CATH AND CORONARY ANGIOGRAPHY N/A 01/12/2017   Procedure: RIGHT/LEFT HEART CATH AND CORONARY ANGIOGRAPHY;  Surgeon: [name], Muhammad A, MD;  Location: [facility];  Service: Cardiovascular;  Laterality: N/A;    Allergies  No Known Allergies  History of Present Illness    85 year old male with the above past medical history including coronary artery disease, pulmonary fibrosis on home O2, hypertension, hyperlipidemia, and sleep apnea.  Cardiac history dates back to 2008, when he underwent drug-eluting stent placement to the first diagonal.  In October 2018, he developed worsening exertional dyspnea and was evaluated with diagnostic catheterization which revealed a 95% heavily calcified stenosis in the mid LAD at the origin of the second diagonal.  The LAD was successfully intervened upon with 2 drug-eluting stents.  He had recurrent worsening dyspnea in  June 2019.  Diagnostic catheterization showed patent LAD and diagonal stents with otherwise nonobstructive disease.  Medical therapy was recommended.  A follow-up echocardiogram in August 2019 performed through his primary care provider's office showed normal LV  function.  In the setting of pulmonary fibrosis, he has been on home O2 at 6 L/min.  Oxygen saturations typically run in the low to mid 90s and sometimes higher after inhaler therapy.  He does not typically experience chest pain though does have some degree of chronic dyspnea on exertion.  This morning, he noted mild dyspnea at rest and checked his pulse ox and found his saturation to be in the 80s with a heart rate in the mid 30s.  This lasted for a few seconds and then heart rate came back into the 60s with saturations coming up into the 90s.  He spoke to Dr. [name] via phone and was advised to present to the office for evaluation and probable Holter monitoring.  Prior to our visit, patient was first seen by his primary care provider.  There, he again had a drop in heart rate into the 30s on his home pulse oximeter but by the time an ECG was performed, his heart rate was stable at 61 bpm.  On arrival to our office, he pointed out on his home pulse oximeter that his heart rate had dropped into the 30s.  By the time we got our pulse oximeter on him, both pulse oximeter is showed heart rate in the 60s.  While I was examining him, it occurred again with heart rates in the 30s on his home pulse oximeter.  I auscultated his heart and noted bradycardia.  At that point, we were able to place him on a 12-lead rhythm strip and were able to document bradycardia with intermittent 2-1 heart block and also A-V dissociation with junctional escape.  During these episodes, he has been asymptomatic.  He denies chest pain, palpitations, presyncope.  He has not had any recent syncope.  He further denies PND, orthopnea, edema, or early satiety.  Home Medications    Prior to Admission medications   Medication Sig Start Date End Date Taking? Authorizing Provider  ACCU-CHEK FASTCLIX LANCETS MISC Use as directed twice day prn diag e11.65 01/25/18  Yes Lyndon Code, MD  budesonide (PULMICORT) 0.5 MG/2ML nebulizer solution USE 1  VIAL VIA NEBULIZER  TWO TIMES DAILY 03/01/18  Yes Johnna Acosta, NP  clopidogrel (PLAVIX) 75 MG tablet TAKE 1 TABLET BY MOUTH  DAILY 03/01/18  Yes Iran Ouch, MD  finasteride (PROSCAR) 5 MG tablet TAKE 1 TABLET BY MOUTH  DAILY 03/01/18  Yes [name] A, MD  glucose blood (ACCU-CHEK GUIDE) test strip Use as instructed twice a day prn diag e11.65 01/25/18  Yes Lyndon Code, MD  mometasone-formoterol (DULERA) 100-5 MCG/ACT AERO Inhale 2 puffs into the lungs 2 (two) times daily. 02/09/18  Yes Yevonne Pax, MD  nitroGLYCERIN (NITROSTAT) 0.4 MG SL tablet Place 1 tablet (0.4 mg total) under the tongue every 5 (five) minutes as needed for chest pain. 01/20/17  Yes Bhagat, Bhavinkumar, PA  pantoprazole (PROTONIX) 40 MG tablet Take 1 tablet (40 mg total) by mouth daily as needed (heartburn). 01/12/18 01/12/19 Yes Iran Ouch, MD  Pirfenidone (ESBRIET) 267 MG CAPS Take 801 mg by mouth 3 (three) times daily.    Yes [provider]  rivastigmine (EXELON) 9.5 mg/24hr Place 1 patch (9.5 mg total) onto the skin daily. 09/02/17  Yes Arida,  Chelsea AusMuhammad A, MD  rosuvastatin (CRESTOR) 40 MG tablet Take 40 mg by mouth every day 05/12/17  Yes [name], Muhammad A, MD  zolpidem (AMBIEN) 10 MG tablet Take 1 tablet (10 mg total) by mouth at bedtime as needed. 03/26/18  Yes Boscia, Kathlynn GrateHeather E, NP  metoprolol tartrate (LOPRESSOR) 25 MG tablet Take 1 tablet (25 mg total) by mouth 2 (two) times daily. 05/11/17 12/01/17  [name], Muhammad A, MD    Family History    Family History  Problem Relation Age of Onset  . CAD Father   . CAD Brother    He indicated that his mother is deceased. He indicated that his father is deceased. He indicated that his brother is alive.  Social History    Social History   Socioeconomic History  . Marital status: Married    Spouse name: Not on file  . Number of children: Not on file  . Years of education: Not on file  . Highest education level: Not on file  Occupational  History  . Occupation: retired  Engineer, productionocial Needs  . Financial resource strain: Not on file  . Food insecurity:    Worry: Not on file    Inability: Not on file  . Transportation needs:    Medical: Not on file    Non-medical: Not on file  Tobacco Use  . Smoking status: Never Smoker  . Smokeless tobacco: Never Used  Substance and Sexual Activity  . Alcohol use: No    Alcohol/week: 0.0 standard drinks  . Drug use: No  . Sexual activity: Yes    Comment: patient states minor  Lifestyle  . Physical activity:    Days per week: Not on file    Minutes per session: Not on file  . Stress: Not on file  Relationships  . Social connections:    Talks on phone: Not on file    Gets together: Not on file    Attends religious service: Not on file    Active member of club or organization: Not on file    Attends meetings of clubs or organizations: Not on file    Relationship status: Not on file  . Intimate partner violence:    Fear of current or ex partner: Not on file    Emotionally abused: Not on file    Physically abused: Not on file    Forced sexual activity: Not on file  Other Topics Concern  . 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     Review of Systems    General:  No chills, fever, night sweats or weight changes.  Cardiovascular:  No chest pain, +++ chronic dyspnea on exertion, no edema, orthopnea, palpitations, paroxysmal nocturnal dyspnea. Dermatological: No rash, lesions/masses Respiratory: No cough, +++ dyspnea Urologic: No hematuria, dysuria Abdominal:   No nausea, vomiting, diarrhea, bright red blood per rectum, melena, or hematemesis Neurologic:  No visual changes, wkns, changes in mental status. All other systems reviewed and are otherwise negative except as noted above.  Physical Exam    VS:  BP 122/60 (BP Location: Left Arm, Patient Position: Sitting, Cuff Size: Normal)   Pulse 60   Ht 5\' 6"  (1.676 m)   Wt 151  lb (68.5 kg)   SpO2 90%   BMI 24.37 kg/m  , BMI Body mass index is 24.37 kg/m. GEN: Well nourished, well developed, in no acute distress. HEENT: normal. Neck: Supple, no JVD, carotid bruits, or masses. Cardiac:  RRR, no murmurs, rubs, or gallops. No clubbing, cyanosis, edema.  Radials/PT 1+ and equal bilaterally.  Respiratory:  Respirations regular and unlabored, diminished breath sounds with bibasilar crackles. GI: Soft, nontender, nondistended, BS + x 4. MS: no deformity or atrophy. Skin: warm and dry, no rash. Neuro:  Strength and sensation are intact. Psych: Normal affect.  Accessory Clinical Findings    ECG personally reviewed by me today-sinus bradycardia, 35, 2-1 heart block, left axis deviation, left anterior fascicular block, left atrial enlargement.  Rhythm strip shows intermittent A-V dissociation with junctional escapes.  Assessment & Plan   1.  Complete heart block/bradycardia: Patient noted bradycardia on his home pulse oximeter today associated with mild dyspnea and drops in oxygen saturation.  He presented to the office for evaluation and during my exam, I noted bradycardia that coincided with alarming of his home pulse oximeter with rates in the 30s.  We obtained a 12-lead rhythm strip and ECG.  ECG initially showed 2-1 heart block at a rate of 35 but ongoing rhythm strip evaluation showed intermittent A-V dissociation suggestive of third-degree heart block.  He is on beta-blocker therapy-metoprolol 25 mg twice daily.  His last dose was this morning.  I did discuss his case with Drs. Gollan and Arida and we have collectively decided that he would benefit most from hospital admission for beta-blocker washout and ongoing telemetry monitoring.  If heart block persists despite beta-blocker washout, patient understands that he will require permanent pacemaker placement.  As he is asymptomatic, he prefers to have his [facility] drive [facility].  We will have her team see him over  there and admit him with EP seeing him in the a.m.  2.  Coronary artery disease: Status post prior PCI's of the LAD and diagonal with most recent catheterization in June 2019 showing patent stents and otherwise nonobstructive disease.  He had normal LV function by echo in August 2019.  He has not been experiencing any chest pain.  No ischemic changes on ECG.  Holding beta-blocker as above.  Continue Plavix, aspirin, and statin therapy.  3.  Essential hypertension: Stable.  Hold beta-blocker.  4.  Hyperlipidemia: LDL was 73 in 2018.  Continue statin therapy.  5.  Pulmonary fibrosis: [facility]  Continue inhalers and oxygen therapy.  Nicolasa Duckinghristopher Maloni Musleh, NP 04/06/2018, 4:21 PM

## 2018-04-07 ENCOUNTER — Encounter (HOSPITAL_COMMUNITY)
Admission: EM | Disposition: A | Discharge status: Home Health | Payer: Self-pay | Source: Home / Self Care | Attending: Cardiovascular Disease

## 2018-04-07 DIAGNOSIS — I441 Atrioventricular block, second degree: Secondary | ICD-10-CM

## 2018-04-07 DIAGNOSIS — Z950 Presence of cardiac pacemaker: Secondary | ICD-10-CM

## 2018-04-07 HISTORY — DX: Presence of cardiac pacemaker: Z95.0

## 2018-04-07 HISTORY — PX: PACEMAKER IMPLANT: EP1218

## 2018-04-07 LAB — TROPONIN I
Troponin I: <0.03 ng/mL (ref <0.03)
Troponin I: <0.03 ng/mL (ref <0.03)

## 2018-04-07 LAB — SURGICAL PCR SCREEN
MRSA, PCR: NEGATIVE
Staphylococcus aureus: NEGATIVE

## 2018-04-07 LAB — BASIC METABOLIC PANEL
Anion gap: 8 (ref 5–15)
BUN: 14 mg/dL (ref 8–23)
CHLORIDE: 111 mmol/L (ref 98–111)
CO2: 21 mmol/L — AB (ref 22–32)
Calcium: 8.3 mg/dL — ABNORMAL LOW (ref 8.9–10.3)
Creatinine, Ser: 0.89 mg/dL (ref 0.61–1.24)
GFR calc Af Amer: >60 mL/min (ref >60)
GFR calc non Af Amer: >60 mL/min (ref >60)
Glucose, Bld: 87 mg/dL (ref 70–99)
Potassium: 4 mmol/L (ref 3.5–5.1)
Sodium: 140 mmol/L (ref 135–145)

## 2018-04-07 LAB — LIPID PANEL
Cholesterol: 102 mg/dL (ref 0–200)
HDL: 30 mg/dL — ABNORMAL LOW (ref >40)
LDL Cholesterol: 64 mg/dL (ref 0–99)
Total CHOL/HDL Ratio: 3.4 RATIO
Triglycerides: 38 mg/dL (ref <150)
VLDL: 8 mg/dL (ref 0–40)

## 2018-04-07 LAB — GLUCOSE, CAPILLARY
Glucose-Capillary: 90 mg/dL (ref 70–99)
Glucose-Capillary: 134 mg/dL — ABNORMAL HIGH (ref 70–99)

## 2018-04-07 LAB — INFLUENZA PANEL BY PCR (TYPE A & B)
Influenza A By PCR: NEGATIVE
Influenza B By PCR: NEGATIVE

## 2018-04-07 SURGERY — PACEMAKER IMPLANT

## 2018-04-07 MED ORDER — CLOPIDOGREL BISULFATE 75 MG PO TABS
75.0000 mg | ORAL_TABLET | Freq: Every day | ORAL | Status: DC
  Administered 2018-04-07: 75 mg via ORAL
  Filled 2018-04-07: qty 1

## 2018-04-07 MED ORDER — HEPARIN (PORCINE) IN NACL 1000-0.9 UT/500ML-% IV SOLN
INTRAVENOUS | Status: AC
  Filled 2018-04-07: qty 500

## 2018-04-07 MED ORDER — CEFAZOLIN SODIUM-DEXTROSE 2-4 GM/100ML-% IV SOLN
2.0000 g | INTRAVENOUS | Status: AC
  Administered 2018-04-07: 2 g via INTRAVENOUS
  Filled 2018-04-07: qty 100

## 2018-04-07 MED ORDER — SODIUM CHLORIDE 0.9% FLUSH
3.0000 mL | Freq: Two times a day (BID) | INTRAVENOUS | Status: DC
  Administered 2018-04-08: 3 mL via INTRAVENOUS

## 2018-04-07 MED ORDER — FENTANYL CITRATE (PF) 100 MCG/2ML IJ SOLN
INTRAMUSCULAR | Status: AC
  Filled 2018-04-07: qty 2

## 2018-04-07 MED ORDER — CEFAZOLIN SODIUM-DEXTROSE 2-4 GM/100ML-% IV SOLN
INTRAVENOUS | Status: AC
  Filled 2018-04-07: qty 100

## 2018-04-07 MED ORDER — LIDOCAINE HCL (PF) 1 % IJ SOLN
INTRAMUSCULAR | Status: DC | PRN
  Administered 2018-04-07: 80 mL

## 2018-04-07 MED ORDER — SODIUM CHLORIDE 0.9 % IV SOLN
80.0000 mg | INTRAVENOUS | Status: AC
  Administered 2018-04-07: 80 mg
  Filled 2018-04-07: qty 2

## 2018-04-07 MED ORDER — IOPAMIDOL (ISOVUE-370) INJECTION 76%
INTRAVENOUS | Status: DC | PRN
  Administered 2018-04-07: 10 mL via INTRAVENOUS

## 2018-04-07 MED ORDER — ROSUVASTATIN CALCIUM 20 MG PO TABS: 40.0000 mg | ORAL_TABLET | Freq: Every day | ORAL | Status: DC

## 2018-04-07 MED ORDER — HEPARIN (PORCINE) IN NACL 1000-0.9 UT/500ML-% IV SOLN
INTRAVENOUS | Status: DC | PRN
  Administered 2018-04-07: 500 mL

## 2018-04-07 MED ORDER — FINASTERIDE 5 MG PO TABS
5.0000 mg | ORAL_TABLET | Freq: Every day | ORAL | Status: DC
  Administered 2018-04-07: 5 mg via ORAL
  Filled 2018-04-07: qty 1

## 2018-04-07 MED ORDER — CHLORHEXIDINE GLUCONATE 4 % EX LIQD
60.0000 mL | Freq: Once | CUTANEOUS | Status: AC
  Administered 2018-04-07: 4 via TOPICAL
  Filled 2018-04-07: qty 15

## 2018-04-07 MED ORDER — CHLORHEXIDINE GLUCONATE 4 % EX LIQD: 60.0000 mL | Freq: Once | CUTANEOUS | Status: AC

## 2018-04-07 MED ORDER — SODIUM CHLORIDE 0.9% FLUSH: 3.0000 mL | INTRAVENOUS | Status: DC | PRN

## 2018-04-07 MED ORDER — SODIUM CHLORIDE 0.9 % IV SOLN: INTRAVENOUS | Status: DC

## 2018-04-07 MED ORDER — ASPIRIN EC 81 MG PO TBEC: 81.0000 mg | DELAYED_RELEASE_TABLET | Freq: Every day | ORAL | Status: DC

## 2018-04-07 MED ORDER — CEFAZOLIN SODIUM-DEXTROSE 1-4 GM/50ML-% IV SOLN
1.0000 g | Freq: Four times a day (QID) | INTRAVENOUS | Status: AC
  Administered 2018-04-07 – 2018-04-08 (×3): 1 g via INTRAVENOUS
  Filled 2018-04-07 (×3): qty 50

## 2018-04-07 MED ORDER — SODIUM CHLORIDE 0.9% FLUSH: 3.0000 mL | Freq: Two times a day (BID) | INTRAVENOUS | Status: DC

## 2018-04-07 MED ORDER — LIDOCAINE HCL 1 % IJ SOLN
INTRAMUSCULAR | Status: AC
  Filled 2018-04-07: qty 20

## 2018-04-07 MED ORDER — MIDAZOLAM HCL 5 MG/5ML IJ SOLN
INTRAMUSCULAR | Status: AC
  Filled 2018-04-07: qty 5

## 2018-04-07 MED ORDER — SODIUM CHLORIDE 0.9 % IV SOLN: 250.0000 mL | INTRAVENOUS | Status: DC

## 2018-04-07 MED ORDER — IOPAMIDOL (ISOVUE-370) INJECTION 76%
INTRAVENOUS | Status: AC
  Filled 2018-04-07: qty 50

## 2018-04-07 MED ORDER — ONDANSETRON HCL 4 MG/2ML IJ SOLN: 4.0000 mg | Freq: Four times a day (QID) | INTRAMUSCULAR | Status: DC | PRN

## 2018-04-07 MED ORDER — ACETAMINOPHEN 325 MG PO TABS: 325.0000 mg | ORAL_TABLET | ORAL | Status: DC | PRN

## 2018-04-07 MED ORDER — SODIUM CHLORIDE 0.9 % IV SOLN
INTRAVENOUS | Status: AC
  Filled 2018-04-07: qty 2

## 2018-04-07 MED ORDER — LIDOCAINE HCL 1 % IJ SOLN
INTRAMUSCULAR | Status: AC
  Filled 2018-04-07: qty 60

## 2018-04-07 SURGICAL SUPPLY — 14 items
CABLE SURGICAL S-101-97-12 (CABLE) ×2 IMPLANT
CATH RIGHTSITE C315HIS02 (CATHETERS) ×1 IMPLANT
GUIDEWIRE ANGLED .035X150CM (WIRE) ×1 IMPLANT
IPG PACE AZUR XT DR MRI W1DR01 (Pacemaker) IMPLANT
KIT MICROPUNCTURE NIT STIFF (SHEATH) ×1 IMPLANT
LEAD CAPSURE NOVUS 5076-52CM (Lead) ×1 IMPLANT
LEAD SELECT SECURE 3830 383069 (Lead) IMPLANT
PACE AZURE XT DR MRI W1DR01 (Pacemaker) ×2 IMPLANT
PAD PRO RADIOLUCENT 2001M-C (PAD) ×2 IMPLANT
SELECT SECURE 3830 383069 (Lead) ×2 IMPLANT
SHEATH CLASSIC 7F (SHEATH) ×2 IMPLANT
SLITTER 6232ADJ (MISCELLANEOUS) ×1 IMPLANT
TRAY PACEMAKER INSERTION (PACKS) ×2 IMPLANT
WIRE HI TORQ VERSACORE-J 145CM (WIRE) ×1 IMPLANT

## 2018-04-07 NOTE — Consult Note (Addendum)
Cardiology Consultation:   Patient ID: Vincent Kennedy MRN: [id number]; DOB: [birth year]  Admit date: 04/06/2018 Date of Consult: 04/07/2018  [facility] Primary Cardiologist: Lorine BearsMuhammad Arida, MD  Primary Electrophysiologist:  Dr. Ladona Ridgelaylor (new)   Patient Profile:   Vincent Kennedy is a 85 y.o. male with a hx of CAD, HTN, HLD, OSA w/CPAP, pulmonary fibrosis (on home o2) who is being seen today for the evaluation of symptomatic bradycardia, CHB at the request of Dr. Royann Shiversroitoru.  History of Present Illness:   Vincent Kennedy developed acutely new episodes of slow HR observed by himself via his home pulse ox with transient HR into the 30's, checking because he had felt unusally winded at rest.  He initially saw his PMD, unable to catch on tracing his bradycardia, then saw [place], NP for Dr. Kirke CorinArida, there again he had a couple episodes of rates 30's by pulse ox, that was again fleeting, though by auscultation confirmed was indeed bradycardic.  ECG was placed and did eventually catch 2:1 AVBlock as well as CHB with V rates 30's, largely asymptomatic.  He was referred to the hospital to allow his home lopressor to wash out while monitoring him.  Last dose of his home lopressor 25mg  BID was 0800-0900 yesterday AM Dr. Royann Shiversroitoru notes Rivastigmine has some potential to prvoke bradycardia though the patient/wife reported improvement in his memory with it.  I do not anticipate stopping this  LABS K+ 4.0 BUN/Creat 14/0.89 Trop I: <0.03 x3 WBC 6.2 H/H 13/41 Plts 168 TSH 1.767  He denies any dizziness but feels his breathing is different when his HR is slow.  No reports of syncope   Past Medical History:  Diagnosis Date  . BPH (benign prostatic hyperplasia)   . Coronary artery disease    a. 11/2006 Cath: D1 99ost (2.5x15 Xience DES), D2 90ost (small); b. 12/2017 PCI: LAD 95 (3.25x8 & 3.0x 33 Sierra DES'. D1 patent stent. D2 30ost. EF 55-65%;  c. 08/2017 Cath:  LM min irregs, LAD 40ost, patent stents, D1 patent stent, LCX nl, OM1/2/3 nl, RCA 30ost, RPDA/RPAV/RPL1/2/3 nl.  . DOE (dyspnea on exertion)    a. Chronic supplemental O2 in setting of pulm fibrosis; b. 10/2017 Echo: Nl LV size/fxn. Mild Ao sclerosis. Trace to mild TR.  [name]. GERD (gastroesophageal reflux disease)   . Hyperlipidemia   . Hypertension   . On home oxygen therapy    "2-3L; 24/7" (01/19/2017); "6L 24/7" (04/06/2018)  . OSA on CPAP   . Oxygen deficiency   . Pneumonia 07/2016  . Pulmonary fibrosis (HCC) 08/26/2015  . Wears hearing aid    (sometimes)    Past Surgical History:  Procedure Laterality Date  . CATARACT EXTRACTION W/PHACO Right 01/29/2015   Procedure: CATARACT EXTRACTION PHACO AND INTRAOCULAR LENS PLACEMENT (IOC);  Surgeon: [name], MD;  [facility];  Service: Ophthalmology;  Laterality: Right;  RESTOR LENS CPAP  . CATARACT EXTRACTION W/PHACO Left 03/12/2015   Procedure: CATARACT EXTRACTION PHACO AND INTRAOCULAR LENS PLACEMENT (IOC);  Surgeon: [name], MD;  [facility];  Service: Ophthalmology;  Laterality: Left;  RESTOR SHUGARCAINE  . COLONOSCOPY WITH ESOPHAGOGASTRODUODENOSCOPY (EGD)    . CORONARY ANGIOPLASTY WITH STENT PLACEMENT  2010   "1 stent"  . CORONARY ANGIOPLASTY WITH STENT PLACEMENT  01/19/2017  . CORONARY ATHERECTOMY N/A 01/19/2017   Procedure: CORONARY ATHERECTOMY - CSI;  Surgeon: [name], Muhammad A, MD;  Location: [facility] INVASIVE CV LAB;  Service: Cardiovascular;  Laterality: N/A;  unable to cross lesion  . CORONARY STENT INTERVENTION N/A 01/19/2017   Procedure: CORONARY STENT INTERVENTION;  Surgeon: Iran Ouch, MD;  Location: [facility]AS[facility] CV [facility];  Service: Cardiovascular;  Laterality: N/A;  . INGUINAL HERNIA REPAIR Bilateral   . LEFT HEART CATH AND CORONARY ANGIOGRAPHY Left 09/12/2017   Procedure: LEFT HEART CATH AND CORONARY ANGIOGRAPHY;  Surgeon: Iran Ouch, MD;  Location: [facility] INVASIVE CV  LAB;  Service: Cardiovascular;  Laterality: Left;  . RIGHT/LEFT HEART CATH AND CORONARY ANGIOGRAPHY N/A 01/12/2017   Procedure: RIGHT/LEFT HEART CATH AND CORONARY ANGIOGRAPHY;  Surgeon: Iran Ouch, MD;  Location: [facility]VASIVE CV [facility];  Service: Cardiovascular;  Laterality: N/A;     Home Medications:  Prior to Admission medications   Medication Sig Start Date End Date Taking? Authorizing Provider  budesonide (PULMICORT) 0.5 MG/2ML nebulizer solution USE 1 VIAL VIA NEBULIZER  TWO TIMES DAILY Patient taking differently: Take 0.5 mg by nebulization 2 (two) times daily.  03/01/18  Yes Scarboro, Coralee North, NP  clopidogrel (PLAVIX) 75 MG tablet TAKE 1 TABLET BY MOUTH  DAILY Patient taking differently: Take 75 mg by mouth daily.  03/01/18  Yes Iran Ouch, MD  finasteride (PROSCAR) 5 MG tablet TAKE 1 TABLET BY MOUTH  DAILY Patient taking differently: Take 5 mg by mouth daily.  03/01/18  Yes Iran Ouch, MD  metoprolol tartrate (LOPRESSOR) 25 MG tablet Take 1 tablet (25 mg total) by mouth 2 (two) times daily. 05/11/17 04/06/18 Yes Iran Ouch, MD  mometasone-formoterol (DULERA) 100-5 MCG/ACT AERO Inhale 2 puffs into the lungs 2 (two) times daily. 02/09/18  Yes Yevonne Pax, MD  nitroGLYCERIN (NITROSTAT) 0.4 MG SL tablet Place 1 tablet (0.4 mg total) under the tongue every 5 (five) minutes as needed for chest pain. 01/20/17  Yes Bhagat, Bhavinkumar, PA  pantoprazole (PROTONIX) 40 MG tablet Take 1 tablet (40 mg total) by mouth daily as needed (heartburn). 01/12/18 01/12/19 Yes Iran Ouch, MD  Pirfenidone (ESBRIET) 267 MG CAPS Take 801 mg by mouth 3 (three) times daily.    Yes [provider]  rivastigmine (EXELON) 9.5 mg/24hr Place 1 patch (9.5 mg total) onto the skin daily. 09/02/17  Yes Iran Ouch, MD  rosuvastatin (CRESTOR) 40 MG tablet Take 40 mg by mouth every day 05/12/17  Yes Iran Ouch, MD  zolpidem (AMBIEN) 10 MG tablet Take 1 tablet (10 mg total) by  mouth at bedtime as needed. 03/26/18  Yes Carlean Jews, NP  ACCU-CHEK FASTCLIX LANCETS MISC Use as directed twice day prn diag e11.65 01/25/18   Lyndon Code, MD  glucose blood (ACCU-CHEK GUIDE) test strip Use as instructed twice a day prn diag e11.65 01/25/18   Lyndon Code, MD    Inpatient Medications: Scheduled Meds: . aspirin EC  81 mg Oral Daily  . budesonide  0.5 mg Nebulization BID  . clopidogrel  75 mg Oral Daily  . enoxaparin (LOVENOX) injection  40 mg Subcutaneous Q24H  . finasteride  5 mg Oral Daily  . insulin aspart  0-15 Units Subcutaneous TID WC  . mometasone-formoterol  2 puff Inhalation BID  . Pirfenidone  801 mg Oral TID  . rivastigmine  9.5 mg Transdermal Daily  . rosuvastatin  40 mg Oral Daily   Continuous Infusions:  PRN Meds: acetaminophen, nitroGLYCERIN, ondansetron (ZOFRAN) IV, pantoprazole, zolpidem  Allergies:   No Known Allergies  Social History:   Social History   Socioeconomic History  . Marital status:  Married    Spouse name: Not on file  . Number of children: Not on file  . Years of education: Not on file  . Highest education level: Not on file  Occupational History  . Occupation: retired  Engineer, production  . Financial resource strain: Not on file  . Food insecurity:    Worry: Not on file    Inability: Not on file  . Transportation needs:    Medical: Not on file    Non-medical: Not on file  Tobacco Use  . Smoking status: Never Smoker  . Smokeless tobacco: Never Used  Substance and Sexual Activity  . Alcohol use: No    Alcohol/week: 0.0 standard drinks  . Drug use: No  . Sexual activity: Yes    Comment: patient states minor  Lifestyle  . Physical activity:    Days per week: Not on file    Minutes per session: Not on file  . Stress: Not on file  Relationships  . Social connections:    Talks on phone: Not on file    Gets together: Not on file    Attends religious service: Not on file    Active member of club or organization: Not  on file    Attends meetings of clubs or organizations: Not on file    Relationship status: Not on file  . Intimate partner violence:    Fear of current or ex partner: Not on file    Emotionally abused: Not on file    Physically abused: Not on file    Forced sexual activity: Not on file  Other Topics Concern  . Not on file  Social History Narrative   Retired Development worker, international aid. Lives in [place] with wife.    Family History:  Family History  Problem Relation Age of Onset  . CAD Father   . CAD Brother      ROS:  Please see the history of present illness. All other ROS reviewed and negative.     Physical Exam/Data:   Vitals:   04/07/18 0014 04/07/18 0441 04/07/18 0910 04/07/18 0935  BP: (!) 99/43 136/66  (!) 121/59  Pulse: (!) 35 63  60  Resp: 15 20  18   Temp: 98.5 F (36.9 C) 97.7 F (36.5 C)  98.5 F (36.9 C)  TempSrc: Axillary Oral  Oral  SpO2: 99% 99% 99% 94%  Weight:  67.3 kg    Height:        Intake/Output Summary (Last 24 hours) at 04/07/2018 1006 Last data filed at 04/07/2018 0634 Gross per 24 hour  Intake 120 ml  Output 450 ml  Net -330 ml   Last 3 Weights 04/07/2018 04/06/2018 04/06/2018  Weight (lbs) 148 lb 5.9 oz 151 lb 0.2 oz 151 lb  Weight (kg) 67.3 kg 68.5 kg 68.493 kg     Body mass index is 23.24 kg/m.  General:  Well nourished, well developed, in no acute distress HEENT: normal Lymph: no adenopathy Neck: no JVD Endocrine:  No thryomegaly Vascular: No carotid bruits Cardiac:  RRR, ; no murmurs, gallops or rubs Lungs:  Diminished throughout, no wheezing, rhonchi or rales  Abd: soft, nontender  Ext: no edema Musculoskeletal:  No deformities Skin: warm and dry  Neuro:   No gross focal abnormalities noted Psych:  Normal affect   EKG:  The EKG was personally reviewed and demonstrates:   04/06/2018 2:1AVBlock, V rate 35, rhythm strip with this also notes AV dissociation intermittently, as well as 1;1 conduction  #2  is SR, 2:1 AVBlock, 35bpm,  QRS 94ms  Telemetry:  Telemetry was personally reviewed and demonstrates:   Predominantly 2:1AVBlock, V rates 30's, remains in AV block currently  Relevant CV Studies:  10/28/17: TTE LVEF normal Mild AS, trace TR  09/12/17" LHC  Previously placed Ost 1st Diag to 1st Diag stent (unknown type) is widely patent.  Previously placed Prox LAD to Mid LAD stent (unknown type) is widely patent.  Ost LAD lesion is 40% stenosed.  Ost RCA lesion is 30% stenosed.   Laboratory Data:  Chemistry Recent Labs  Lab 04/06/18 1707 04/06/18 1851 04/07/18 0636  NA 141 140 140  K 3.8 3.9 4.0  CL 106 108 111  CO2 25 24 21*  GLUCOSE 95 110* 87  BUN 20 20 14   CREATININE 1.00 1.01 0.89  CALCIUM 8.8* 8.4* 8.3*  GFRNONAA >60 >60 >60  GFRAA >60 >60 >60  ANIONGAP 10 8 8     Recent Labs  Lab 04/06/18 1707 04/06/18 1851  PROT 8.0 7.3  ALBUMIN 3.5 3.2*  AST 28 26  ALT 18 16  ALKPHOS 59 54  BILITOT 0.3 0.5   Hematology Recent Labs  Lab 04/06/18 1707 04/06/18 1851  WBC 6.6 6.2  RBC 4.67 4.54  HGB 14.1 13.3  HCT 43.8 41.8  MCV 93.8 92.1  MCH 30.2 29.3  MCHC 32.2 31.8  RDW 13.7 13.6  PLT 201 168   Cardiac Enzymes Recent Labs  Lab 04/06/18 1851 04/06/18 2351 04/07/18 0636  TROPONINI <0.03 <0.03 <0.03   No results for input(s): TROPIPOC in the last 168 hours.  BNPNo results for input(s): BNP, PROBNP in the last 168 hours.  DDimer No results for input(s): DDIMER in the last 168 hours.  Radiology/Studies:   Dg Chest Portable 1 View Result Date: 04/06/2018 CLINICAL DATA:  Shortness of breath, bradycardia, pulmonary fibrosis, hypertension, coronary artery disease post stenting EXAM: PORTABLE CHEST 1 VIEW COMPARISON:  Portable exam 1716 hours compared to 01/19/2018 FINDINGS: Stable heart size and mediastinal contours. Atherosclerotic calcification aorta. Chronic interstitial thickening throughout the lungs bilaterally, basilar predominance, compatible with pulmonary fibrosis. No  definite superimposed infiltrate, pleural effusion or pneumothorax. Bones demineralized. IMPRESSION: Pulmonary fibrosis. No acute abnormalities. Electronically Signed   By: Ulyses Southward M.D.   On: 04/06/2018 18:04    Assessment and Plan:   1. Advanced AV block, including CHB     Last dose of home lopressor was at the latest 0900 yesterday     BP has been stable  He remains 2;1 AVBlock this AM Will follow his telemetry if no improvement by this afternoon, will plan for PPM implant  Dr. 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  We revisited implant procedure, potential risks and benefits.  He is willing to proceed should he continue to have intermittent heart block later today  I have instructed him not to eat until a decision ins made      For questions or updates, please contact [facility] Please consult [email or web address] for contact info under     Signed, Sheilah Pigeon, PA-C  04/07/2018 10:06 AM   EP Attending  Patient seen and examined. Agree with above. The patient has persistent 2:1 AV block. He has been off of his metoprolol for 30 hours. I will recheck later today and if his AV conduction does not improve, he will undergo insertion of a DDD PM. He  will remain NPO.   Leonia ReevesGregg Oluwadarasimi Favor,M.D.

## 2018-04-07 NOTE — Progress Notes (Signed)
Pt arrived back to 4e from cath lab. Pacemaker to left chest. Pressure dressing present. Dressing clean, dry, and intact. Vitals obtained and stable. Family at bedside. Pt denies needs at this time. Will continue to monitor.   Ardeen Jourdain BSN, RN

## 2018-04-07 NOTE — Discharge Instructions (Signed)
° ° °  Supplemental Discharge Instructions for  °Pacemaker/Defibrillator Patients ° °Activity °No heavy lifting or vigorous activity with your left/right arm for 6 to 8 weeks.  Do not raise your left/right arm above your head for one week.  Gradually raise your affected arm as drawn below. ° °        °    04/11/2018                 04/12/2018                04/13/2018              04/14/2018 °__ ° °NO DRIVING for  1 week  ; you may begin driving on   04/14/2018  . ° °WOUND CARE °- Keep the wound area clean and dry.  Do not get this area wet for one week. No showers for one week; you may shower on 04/14/2018  . °- The tape/steri-strips on your wound will fall off; do not pull them off.  No bandage is needed on the site.  DO  NOT apply any creams, oils, or ointments to the wound area. °- If you notice any drainage or discharge from the wound, any swelling or bruising at the site, or you develop a fever > 101? F after you are discharged home, call the office at once. ° °Special Instructions °- You are still able to use cellular telephones; use the ear opposite the side where you have your pacemaker/defibrillator.  Avoid carrying your cellular phone near your device. °- When traveling through airports, show security personnel your identification card to avoid being screened in the metal detectors.  Ask the security personnel to use the hand wand. °- Avoid arc welding equipment, MRI testing (magnetic resonance imaging), TENS units (transcutaneous nerve stimulators).  Call the office for questions about other devices. °- Avoid electrical appliances that are in poor condition or are not properly grounded. °- Microwave ovens are safe to be near or to operate. ° ° °

## 2018-04-07 NOTE — Progress Notes (Signed)
RT NOTE:  CPAP pressure changes to 15cm H2O per family request based on home setting.

## 2018-04-08 ENCOUNTER — Encounter (HOSPITAL_COMMUNITY): Payer: Self-pay | Admitting: Cardiology

## 2018-04-08 ENCOUNTER — Inpatient Hospital Stay (HOSPITAL_COMMUNITY): Payer: Medicare Other

## 2018-04-08 DIAGNOSIS — Z950 Presence of cardiac pacemaker: Secondary | ICD-10-CM

## 2018-04-08 DIAGNOSIS — I442 Atrioventricular block, complete: Principal | ICD-10-CM

## 2018-04-08 LAB — GLUCOSE, CAPILLARY
Glucose-Capillary: 82 mg/dL (ref 70–99)
Glucose-Capillary: 97 mg/dL (ref 70–99)
Glucose-Capillary: 88 mg/dL (ref 70–99)

## 2018-04-08 MED ORDER — ASPIRIN 81 MG PO TBEC: 81.0000 mg | DELAYED_RELEASE_TABLET | Freq: Every day | ORAL | 3 refills | Status: DC

## 2018-04-08 NOTE — Care Management Note (Signed)
Case Management Note  Patient Details  Name: Vincent Kennedy MRN: [id number] Date of Birth: 07/01/1932  Subjective/Objective:       Pt to return home with wife.  Pt has O2 with [facility] and wants [facility] for HHPT.             Action/Plan: Referral called to [facility] with [facility].  Wife states they no longer see a PMD.  They only see cardiology and [name], pulmonologist.    Wife has O2 to transport patient home.   Expected Discharge Date:  04/08/18               Expected Discharge Plan:  Home w Home Health Services  In-House Referral:  NA  Discharge planning Services  CM Consult  Post Acute Care Choice:  Home Health Choice offered to:  Patient  DME Arranged:    DME Agency:     HH Arranged:  PT HH Agency:  [facility]  Status of Service:  Completed, signed off  If discussed at Long Length of Stay Meetings, dates discussed:    Additional Comments:  Deveron Furlong, RN 04/08/2018, 1:54 PM

## 2018-04-08 NOTE — Evaluation (Signed)
Physical Therapy Evaluation Patient Details Name: Vincent Kennedy MRN: [id number] DOB: 10/01/1932 Today's Date: 04/08/2018   History of Present Illness  Pt is a 85 y.o. M with significant PMH of CAD, HTN, pulmonary fibrosis (on home O2) who is being seen for evaluation of symptomatic bradycardia.  Clinical Impression  Pt admitted with above diagnosis. Pt currently with functional limitations due to the deficits listed below (see PT Problem List). Prior to admission, pt is a limited community ambulator and occasionally uses Occupational hygienist. On PT evaluation, pt presenting with decreased functional mobility secondary to weakness and decreased endurance. Ambulating 100 feet with walker and fatigues easily, SpO2 90-96% on 6L O2. Recommending HHPT to maximize functional independence and decrease caregiver burden. Pt will benefit from skilled PT to increase their independence and safety with mobility to allow discharge to the venue listed below.       Follow Up Recommendations Home health PT    Equipment Recommendations  None recommended by PT    Recommendations for Other Services       Precautions / Restrictions Precautions Precautions: Fall Precaution Comments: watch O2 Restrictions Weight Bearing Restrictions: No      Mobility  Bed Mobility Overal bed mobility: Modified Independent             General bed mobility comments: increased time  Transfers Overall transfer level: Needs assistance Equipment used: Rolling walker (2 wheeled) Transfers: Sit to/from Stand Sit to Stand: Supervision            Ambulation/Gait Ambulation/Gait assistance: Min guard Gait Distance (Feet): 100 Feet Assistive device: Rolling walker (2 wheeled) Gait Pattern/deviations: Step-through pattern;Decreased stride length;Narrow base of support;Trunk flexed Gait velocity: decreased Gait velocity interpretation: <1.8 ft/sec, indicate of risk for recurrent falls General Gait Details: Cues for walker  proximity. Pt with decreased gait speed with increased distance. Fatigues quickly  Information systems manager Rankin (Stroke Patients Only)       Balance Overall balance assessment: Mild deficits observed, not formally tested                                           Pertinent Vitals/Pain Pain Assessment: Faces Faces Pain Scale: Hurts a little bit Pain Location: surgical site Pain Descriptors / Indicators: Guarding Pain Intervention(s): Monitored during session    Home Living Family/patient expects to be discharged to:: Private residence Living Arrangements: Spouse/significant other Available Help at Discharge: Family Type of Home: House Home Access: Stairs to enter   Secretary/administrator of Steps: 1   Home Equipment: Environmental consultant - 4 wheels      Prior Function Level of Independence: Independent         Comments: Limited community ambulator     Hand Dominance        Extremity/Trunk Assessment   Upper Extremity Assessment Upper Extremity Assessment: Overall WFL for tasks assessed    Lower Extremity Assessment Lower Extremity Assessment: Overall WFL for tasks assessed    Cervical / Trunk Assessment Cervical / Trunk Assessment: Kyphotic  Communication   Communication: No difficulties  Cognition Arousal/Alertness: Awake/alert Behavior During Therapy: Flat affect Overall Cognitive Status: Within Functional Limits for tasks assessed  General Comments: Minimal verbalizations      General Comments      Exercises     Assessment/Plan    PT Assessment Patient needs continued PT services  PT Problem List Decreased strength;Decreased activity tolerance;Decreased balance;Decreased mobility       PT Treatment Interventions Gait training;DME instruction;Stair training;Functional mobility training;Therapeutic activities;Therapeutic exercise;Balance  training;Patient/family education    PT Goals (Current goals can be found in the Care Plan section)  Acute Rehab PT Goals Patient Stated Goal: none stated by pt; pt wife would like therapy at home PT Goal Formulation: With patient/family Time For Goal Achievement: 04/22/18 Potential to Achieve Goals: Good    Frequency Min 3X/week   Barriers to discharge        Co-evaluation               AM-PAC PT "6 Clicks" Mobility  Outcome Measure Help needed turning from your back to your side while in a flat bed without using bedrails?: None Help needed moving from lying on your back to sitting on the side of a flat bed without using bedrails?: None Help needed moving to and from a bed to a chair (including a wheelchair)?: A Little Help needed standing up from a chair using your arms (e.g., wheelchair or bedside chair)?: None Help needed to walk in hospital room?: A Little Help needed climbing 3-5 steps with a railing? : A Lot 6 Click Score: 20    End of Session Equipment Utilized During Treatment: Oxygen Activity Tolerance: Patient tolerated treatment well Patient left: in bed;with call bell/phone within reach;with family/visitor present Nurse Communication: Mobility status PT Visit Diagnosis: Difficulty in walking, not elsewhere classified (R26.2);Unsteadiness on feet (R26.81)    Time: 1100-1130 PT Time Calculation (min) (ACUTE ONLY): 30 min   Charges:   PT Evaluation $PT Eval Moderate Complexity: 1 Mod PT Treatments $Gait Training: 8-22 mins       Laurina Bustlearoline Tedd Cottrill, PT, DPT Acute Rehabilitation Services Pager [phone] Office [phone]  Vincent Kennedy 04/08/2018, 12:06 PM

## 2018-04-08 NOTE — Progress Notes (Signed)
Progress Note  Patient Name: Vincent Kennedy Date of Encounter: 04/08/2018  Primary Cardiologist: [name], MD   Subjective   Feeling weak. Had pacemaker implanted yesterday for 2:1 block  Inpatient Medications    Scheduled Meds: . aspirin EC  81 mg Oral QHS  . budesonide  0.5 mg Nebulization BID  . clopidogrel  75 mg Oral QHS  . enoxaparin (LOVENOX) injection  40 mg Subcutaneous Q24H  . finasteride  5 mg Oral QHS  . insulin aspart  0-15 Units Subcutaneous TID WC  . mometasone-formoterol  2 puff Inhalation BID  . Pirfenidone  801 mg Oral TID  . rivastigmine  9.5 mg Transdermal Daily  . rosuvastatin  40 mg Oral QHS  . sodium chloride flush  3 mL Intravenous Q12H   Continuous Infusions: .  ceFAZolin (ANCEF) IV 1 g (04/08/18 0348)   PRN Meds: acetaminophen, nitroGLYCERIN, ondansetron (ZOFRAN) IV, pantoprazole, zolpidem   Vital Signs    Vitals:   04/08/18 0300 04/08/18 0500 04/08/18 0700 04/08/18 0847  BP:    129/64  Pulse: 74 64 70 72  Resp: (!) 37 (!) 23 19 15   Temp:    98.2 F (36.8 C)  TempSrc:    Oral  SpO2: 100% 98% 98% 97%  Weight:      Height:        Intake/Output Summary (Last 24 hours) at 04/08/2018 0923 Last data filed at 04/07/2018 2250 Gross per 24 hour  Intake 50 ml  Output -  Net 50 ml   Last 3 Weights 04/07/2018 04/06/2018 04/06/2018  Weight (lbs) 148 lb 5.9 oz 151 lb 0.2 oz 151 lb  Weight (kg) 67.3 kg 68.5 kg 68.493 kg      Telemetry    A sense, V paced - Personally Reviewed  ECG    A sense, V paced - Personally Reviewed  Physical Exam   GEN: No acute distress.   Neck: No JVD Cardiac: RRR, no murmurs, rubs, or gallops.  Respiratory: Clear to auscultation bilaterally. GI: Soft, nontender, non-distended  MS: No edema; No deformity. Neuro:  Nonfocal  Psych: Normal affect   Labs    Chemistry Recent Labs  Lab 04/06/18 1707 04/06/18 1851 04/07/18 0636  NA 141 140 140  K 3.8 3.9 4.0  CL 106 108 111  CO2 25 24 21*    GLUCOSE 95 110* 87  BUN 20 20 14   CREATININE 1.00 1.01 0.89  CALCIUM 8.8* 8.4* 8.3*  PROT 8.0 7.3  --   ALBUMIN 3.5 3.2*  --   AST 28 26  --   ALT 18 16  --   ALKPHOS 59 54  --   BILITOT 0.3 0.5  --   GFRNONAA >60 >60 >60  GFRAA >60 >60 >60  ANIONGAP 10 8 8      Hematology Recent Labs  Lab 04/06/18 1707 04/06/18 1851  WBC 6.6 6.2  RBC 4.67 4.54  HGB 14.1 13.3  HCT 43.8 41.8  MCV 93.8 92.1  MCH 30.2 29.3  MCHC 32.2 31.8  RDW 13.7 13.6  PLT 201 168    Cardiac Enzymes Recent Labs  Lab 04/06/18 1851 04/06/18 2351 04/07/18 0636  TROPONINI <0.03 <0.03 <0.03   No results for input(s): TROPIPOC in the last 168 hours.   BNPNo results for input(s): BNP, PROBNP in the last 168 hours.   DDimer No results for input(s): DDIMER in the last 168 hours.   Radiology    Dg Chest 2 View  Result Date:  04/08/2018 CLINICAL DATA:  Pacemaker placement EXAM: CHEST - 2 VIEW COMPARISON:  April 06, 2018. FINDINGS: Pacemaker lead tips are attached to the right atrium. No pneumothorax. There is widespread pulmonary fibrosis with small left pleural effusion. There is no frank edema or consolidation. Heart is upper normal in size with pulmonary vascularity within normal limits. There is aortic atherosclerosis. No bone lesions. IMPRESSION: Pacemaker present without pneumothorax. Small left pleural effusion. Extensive fibrosis throughout the lungs, stable. Stable cardiac silhouette. There is aortic atherosclerosis. Aortic Atherosclerosis (ICD10-I70.0). Electronically Signed   By: Bretta Bang III M.D.   On: 04/08/2018 07:12   Dg Chest Portable 1 View  Result Date: 04/06/2018 CLINICAL DATA:  Shortness of breath, bradycardia, pulmonary fibrosis, hypertension, coronary artery disease post stenting EXAM: PORTABLE CHEST 1 VIEW COMPARISON:  Portable exam 1716 hours compared to 01/19/2018 FINDINGS: Stable heart size and mediastinal contours. Atherosclerotic calcification aorta. Chronic interstitial  thickening throughout the lungs bilaterally, basilar predominance, compatible with pulmonary fibrosis. No definite superimposed infiltrate, pleural effusion or pneumothorax. Bones demineralized. IMPRESSION: Pulmonary fibrosis. No acute abnormalities. Electronically Signed   By: Ulyses Southward M.D.   On: 04/06/2018 18:04    Cardiac Studies   10/28/17: TTE LVEF normal Mild AS, trace TR  09/12/17" LHC  Previously placed Ost 1st Diag to 1st Diag stent (unknown type) is widely patent.  Previously placed Prox LAD to Mid LAD stent (unknown type) is widely patent.  Ost LAD lesion is 40% stenosed.  Ost RCA lesion is 30% stenosed.  Patient Profile     85 y.o. male presented to the hospital with 2:1 AV block, no s/p pacemaker  Assessment & Plan    1. Second degree AV block: s/p medtronic pacemaker, device functioning appropriately. Likely discharge today after he woriks with PT.  For questions or updates, please contact [facility] Please consult [email or web address] for contact info under        Signed, Vincent Kennedy Vincent Loa, MD  04/08/2018, 9:23 AM

## 2018-04-08 NOTE — Discharge Summary (Addendum)
Discharge Summary    Patient ID: Vincent Kennedy MRN: [id number]; DOB: [birth year]  Admit date: 04/06/2018 Discharge date: 04/08/2018  [facility] [place]  Primary Cardiologist: Lorine BearsMuhammad Arida, MD  Primary Electrophysiologist:  Lewayne BuntingGregg Taylor, MD   Discharge Diagnoses    Active Problems:   Complete heart block [facility](HCC)   S/P placement of cardiac pacemaker   Allergies No Known Allergies  Diagnostic Studies/Procedures    PACEMAKER IMPLANT  04/07/2018  Conclusion   CONCLUSIONS:   1. Successful implantation of a Medtronic dual-chamber pacemaker for symptomatic bradycardia due to mobitz 2, second degree AV block  2. No early apparent complications.    Lewayne BuntingGregg Taylor, MD   _____________   History of Present Illness     Vincent CoderMohammad A Naji is an 85 year old male with a history of coronary artery disease, pulmonary fibrosis on home oxygen, hypertension, hyperlipidemia and sleep apnea.  He was seen in the office on 04/06/2018 for evaluation of bradycardia and complete heart block.  Cardiac history dates back to 2008, when he underwent DES to first diagonal.  In October 2018 he developed worsening exertional dyspnea and cardiac cath revealed mid LAD stenosis and he received 2 drug-eluting stents.  He had recurrent worsening dyspnea in 08/2017.  Diagnostic cath showed patent LAD and diagonal stents with otherwise nonobstructive disease.  Medical therapy was recommended.  A follow-up echo in 10/2017 showed normal LV function.  On the morning of 04/06/2018 the patient noted dyspnea at rest and checked his pulse ox finding oxygen saturation in the 80s with a heart rate in the mid 30s.  This lasted for several seconds and and heart rate came back into the 60s with saturation in the 90s.  He spoke to Dr. [place] Sinkita via phone who advised the patient to come into the office for probable Holter monitoring.  Prior to coming into the cardiology office he was seen by his PCP and again  noted heart rate dropping into the 30s.  In our office his heart rate was initially in the 60s but during examination his pulse dropped into the 30s.  A 12-lead EKG during an episode of bradycardia showed intermittent 2:1 heart block and A-V dissociation with junctional escape.  His beta-blocker was discontinued. Dr. Royann Shiversroitoru notes Rivastigmine has some potential to prvoke bradycardia though the patient/wife reported improvement in his memory with it.  Dr. Ladona Ridgelaylor did not anticipate stopping this.   Hospital Course     Consultants: None  The patient remained in 2:1 AV block more than 30 hours after discontinuation of beta-blocker.  The patient underwent successful implantation of a Medtronic dual-chamber pacemaker on 04/07/2018.  He has been monitored overnight with no complications.  Chest x-ray this morning shows no pneumothorax.  He has a small left pleural effusion.  His pacemaker was interrogated and functioning normally per Dr. Elberta Fortisamnitz.   Patient has been evaluated by physical therapy who is Recommending HHPT to maximize functional independence and decrease caregiver burden. This will be arranged.  Patient has been seen by Dr. Elberta Fortisamnitz today and deemed ready for discharge home. All follow up appointments have been scheduled. Discharge medications are listed below.  _____________  Discharge Vitals Blood pressure 129/64, pulse 72, temperature 98.2 F (36.8 C), temperature source Oral, resp. rate 15, height 5\' 7"  (1.702 m), weight 67.3 kg, SpO2 97 %.  Filed Weights   04/06/18 1753 04/07/18 0441  Weight: 68.5 kg 67.3 kg    Labs & Radiologic Studies    CBC Recent  Labs    04/06/18 1707 04/06/18 1851  WBC 6.6 6.2  NEUTROABS  --  3.7  HGB 14.1 13.3  HCT 43.8 41.8  MCV 93.8 92.1  PLT 201 168   Basic Metabolic Panel Recent Labs    87/86/76 1707 04/06/18 1851 04/07/18 0636  NA 141 140 140  K 3.8 3.9 4.0  CL 106 108 111  CO2 25 24 21*  GLUCOSE 95 110* 87  BUN 20 20 14     CREATININE 1.00 1.01 0.89  CALCIUM 8.8* 8.4* 8.3*  MG 2.1 2.1  --   PHOS 3.6  --   --    Liver Function Tests Recent Labs    04/06/18 1707 04/06/18 1851  AST 28 26  ALT 18 16  ALKPHOS 59 54  BILITOT 0.3 0.5  PROT 8.0 7.3  ALBUMIN 3.5 3.2*   No results for input(s): LIPASE, AMYLASE in the last 72 hours. Cardiac Enzymes Recent Labs    04/06/18 1851 04/06/18 2351 04/07/18 0636  TROPONINI <0.03 <0.03 <0.03   BNP Invalid input(s): POCBNP D-Dimer No results for input(s): DDIMER in the last 72 hours. Hemoglobin A1C No results for input(s): HGBA1C in the last 72 hours. Fasting Lipid Panel Recent Labs    04/06/18 2351  CHOL 102  HDL 30*  LDLCALC 64  TRIG 38  CHOLHDL 3.4   Thyroid Function Tests Recent Labs    04/06/18 1851  TSH 1.767   _____________  Dg Chest 2 View  Result Date: 04/08/2018 CLINICAL DATA:  Pacemaker placement EXAM: CHEST - 2 VIEW COMPARISON:  April 06, 2018. FINDINGS: Pacemaker lead tips are attached to the right atrium. No pneumothorax. There is widespread pulmonary fibrosis with small left pleural effusion. There is no frank edema or consolidation. Heart is upper normal in size with pulmonary vascularity within normal limits. There is aortic atherosclerosis. No bone lesions. IMPRESSION: Pacemaker present without pneumothorax. Small left pleural effusion. Extensive fibrosis throughout the lungs, stable. Stable cardiac silhouette. There is aortic atherosclerosis. Aortic Atherosclerosis (ICD10-I70.0). Electronically Signed   By: Bretta Bang III M.D.   On: 04/08/2018 07:12   Dg Chest Portable 1 View  Result Date: 04/06/2018 CLINICAL DATA:  Shortness of breath, bradycardia, pulmonary fibrosis, hypertension, coronary artery disease post stenting EXAM: PORTABLE CHEST 1 VIEW COMPARISON:  Portable exam 1716 hours compared to 01/19/2018 FINDINGS: Stable heart size and mediastinal contours. Atherosclerotic calcification aorta. Chronic interstitial  thickening throughout the lungs bilaterally, basilar predominance, compatible with pulmonary fibrosis. No definite superimposed infiltrate, pleural effusion or pneumothorax. Bones demineralized. IMPRESSION: Pulmonary fibrosis. No acute abnormalities. Electronically Signed   By: Ulyses Southward M.D.   On: 04/06/2018 18:04   Disposition   Pt is being discharged home today in good condition.  Follow-up Plans & Appointments    Follow-up Information    Marily Lente, NP Follow up.   Specialty:  Cardiology Why:  04/21/2018 @ 10:00AM, wound check visit Contact information: [address] [place] [address] [phone]        Marinus Maw, MD Follow up.   Specialty:  Cardiology Why:  07/11/2018 @ 3:00PM Contact information: [address] [place] [address] [phone]          Discharge Instructions    Diet - low sodium heart healthy   Complete by:  As directed    Increase activity slowly   Complete by:  As directed       Discharge Medications   Allergies as of 04/08/2018  No Known Allergies     Medication List    STOP taking these medications   metoprolol tartrate 25 MG tablet Commonly known as:  LOPRESSOR     TAKE these medications   ACCU-CHEK FASTCLIX LANCETS Misc Use as directed twice day prn diag e11.65   aspirin 81 MG EC tablet Take 1 tablet (81 mg total) by mouth at bedtime.   budesonide 0.5 MG/2ML nebulizer solution Commonly known as:  PULMICORT USE 1 VIAL VIA NEBULIZER  TWO TIMES DAILY What changed:  See the new instructions.   clopidogrel 75 MG tablet Commonly known as:  PLAVIX TAKE 1 TABLET BY MOUTH  DAILY   ESBRIET 267 MG Caps Generic drug:  Pirfenidone Take 801 mg by mouth 3 (three) times daily.   finasteride 5 MG tablet Commonly known as:  PROSCAR TAKE 1 TABLET BY MOUTH  DAILY   glucose blood test strip Commonly known as:  ACCU-CHEK GUIDE Use as instructed twice a day prn diag e11.65     mometasone-formoterol 100-5 MCG/ACT Aero Commonly known as:  DULERA Inhale 2 puffs into the lungs 2 (two) times daily.   nitroGLYCERIN 0.4 MG SL tablet Commonly known as:  NITROSTAT Place 1 tablet (0.4 mg total) under the tongue every 5 (five) minutes as needed for chest pain.   pantoprazole 40 MG tablet Commonly known as:  PROTONIX Take 1 tablet (40 mg total) by mouth daily as needed (heartburn).   rivastigmine 9.5 mg/24hr Commonly known as:  EXELON Place 1 patch (9.5 mg total) onto the skin daily.   rosuvastatin 40 MG tablet Commonly known as:  CRESTOR Take 40 mg by mouth every day   zolpidem 10 MG tablet Commonly known as:  AMBIEN Take 1 tablet (10 mg total) by mouth at bedtime as needed.        Acute coronary syndrome (MI, NSTEMI, STEMI, etc) this admission?: No.    Outstanding Labs/Studies   None  Duration of Discharge Encounter   Greater than 30 minutes including physician time.  Signed, Berton BonJanine Hammond, NP 04/08/2018, 12:46 PM    I have seen and examined this patient with Lizabeth LeydenNina Hammond.  Agree with above, note added to reflect my findings.  On exam, RRR, no murmurs, lungs clear.  Admitted with 2-1 heart block and complete heart block.  Is status post Medtronic pacemaker.  Chest x-ray and interrogation without issues.  We will plan for discharge today with home health PT.  Will M. Camnitz MD 04/08/2018 1:46 PM

## 2018-04-10 ENCOUNTER — Telehealth: Payer: Self-pay | Admitting: Cardiovascular Disease

## 2018-04-10 ENCOUNTER — Encounter (HOSPITAL_COMMUNITY): Payer: Self-pay | Admitting: Internal Medicine

## 2018-04-10 MED FILL — Lidocaine HCl Local Inj 1%: INTRAMUSCULAR | Qty: 80 | Status: AC

## 2018-04-10 NOTE — Telephone Encounter (Signed)
Please give verbal approval for home health physical therapy . Please call.  Also states pt resting HR is 100

## 2018-04-10 NOTE — Consult Note (Signed)
            [facility] CM Primary Care Navigator  04/10/2018  Vincent Kennedy 09/20/1932 [id number]   Attempt to seepatient at the bedside to identify possible discharge needs buthe wasalreadydischargedhomeover the weekend. Patient went home with home health services.  Per MD note,patientpresentedfor evaluation of bradycardia and complete heart block. (complete heart block status post placement of cardiac pacemaker)  Primary care provider's office is listed as providing transition of care (TOC) follow-up. Per Inpatient CM note, patient's wife stated that they no longer see a primary care provider and they only see cardiology and [name], pulmonologist.    Patient has discharge instruction to follow-up withcardiology on 04/21/18.   For additional questions please contact:  [name], BSN, RN-BC [facility] PRIMARY CARE Navigator Cell: [phone]

## 2018-04-11 ENCOUNTER — Telehealth: Payer: Self-pay

## 2018-04-11 NOTE — Telephone Encounter (Signed)
Gave verbal order lynn [phone] 3 times a 1 week for 1 week and 2 times a week for 1 week and resting pulse 100

## 2018-04-11 NOTE — Telephone Encounter (Signed)
Left a message for the patient to call back.  

## 2018-04-13 NOTE — Telephone Encounter (Signed)
Home Health returning call

## 2018-04-13 NOTE — Telephone Encounter (Signed)
Dian Queen for PT calling States that they have already received PT approval from Dr Welton Flakes Please call Larita Fife with any questions or concerns

## 2018-04-13 NOTE — Telephone Encounter (Signed)
Left voicemail message to call back  

## 2018-04-21 ENCOUNTER — Ambulatory Visit (INDEPENDENT_AMBULATORY_CARE_PROVIDER_SITE_OTHER): Payer: Medicare Other | Admitting: Nurse Practitioner

## 2018-04-21 DIAGNOSIS — I442 Atrioventricular block, complete: Secondary | ICD-10-CM | POA: Diagnosis not present

## 2018-04-21 LAB — CUP PACEART INCLINIC DEVICE CHECK
Date Time Interrogation Session: 20200131103648
Implantable Lead Implant Date: 20200117
Implantable Lead Location: 753859
Implantable Lead Location: 753860
Implantable Lead Model: 3830
Implantable Lead Model: 5076
Implantable Pulse Generator Implant Date: 20200117
MDC IDC LEAD IMPLANT DT: 20200117

## 2018-04-21 NOTE — Progress Notes (Signed)
Wound check appointment. Steri-strips removed. Wound without redness or edema. Incision edges approximated, wound well healed. Normal device function. Thresholds, sensing, and impedances consistent with implant measurements. Device programmed at 3.5V/auto capture programmed on for extra safety margin until 3 month visit. RV capture non selective down to loss of capture. Histogram distribution appropriate for patient and level of activity. No mode switches or high ventricular rates noted. Patient educated about wound care, arm mobility, lifting restrictions. ROV in 3 months with implanting physician.

## 2018-04-22 DIAGNOSIS — J9601 Acute respiratory failure with hypoxia: Secondary | ICD-10-CM | POA: Diagnosis not present

## 2018-05-04 ENCOUNTER — Telehealth: Payer: Self-pay

## 2018-05-04 NOTE — Telephone Encounter (Signed)
Pt appears on [facility] Quality Report for [facility] but hasn't been seen in over a year.  I called the patient to confirm his PCP, and he said that he will be seeing Dr. Mila Merry [facility]) as his PCP. VDM (DD)

## 2018-05-21 DIAGNOSIS — J9601 Acute respiratory failure with hypoxia: Secondary | ICD-10-CM | POA: Diagnosis not present

## 2018-05-21 DIAGNOSIS — J84115 Respiratory bronchiolitis interstitial lung disease: Secondary | ICD-10-CM | POA: Diagnosis not present

## 2018-05-21 DIAGNOSIS — R062 Wheezing: Secondary | ICD-10-CM | POA: Diagnosis not present

## 2018-05-24 ENCOUNTER — Other Ambulatory Visit: Payer: Self-pay | Admitting: Cardiovascular Disease

## 2018-05-24 NOTE — Telephone Encounter (Signed)
Left a message for the patient to call back to confirm medications.

## 2018-05-24 NOTE — Telephone Encounter (Signed)
Please review for refill.  

## 2018-05-25 NOTE — Telephone Encounter (Signed)
Call to verify current medications. He reported that he was taking metoprolol ( 12.5 mg total) once daily.  This appears to have been d/c while he was in the hospital. He said Dr. [name] told him verbally to take this amount.   Pt also reported taking proscar (10 mg total) once daily over the past week and has helped with his symptom.   He has f/u with Dr. [name] on 3/12.   Routed to provider for advice.

## 2018-05-26 ENCOUNTER — Other Ambulatory Visit: Payer: Self-pay

## 2018-05-26 MED ORDER — ROSUVASTATIN CALCIUM 40 MG PO TABS: ORAL_TABLET | ORAL | 3 refills | Status: DC

## 2018-05-26 MED ORDER — RIVASTIGMINE 9.5 MG/24HR TD PT24: 9.5000 mg | MEDICATED_PATCH | Freq: Every day | TRANSDERMAL | 2 refills | Status: DC

## 2018-05-26 NOTE — Telephone Encounter (Signed)
Requested Prescriptions   Signed Prescriptions Disp Refills  . rosuvastatin (CRESTOR) 40 MG tablet 90 tablet 3    Sig: Take 40 mg by mouth every day    Authorizing Provider: [name] A    Ordering User: Margrett Rud rivastigmine (EXELON) 9.5 mg/24hr 90 patch 2    Sig: Place 1 patch (9.5 mg total) onto the skin daily.    Authorizing Provider: [name] A    Ordering User: Margrett Rud

## 2018-05-27 ENCOUNTER — Encounter: Payer: Self-pay | Admitting: Internal Medicine

## 2018-05-31 NOTE — Progress Notes (Signed)
Cardiology Office Note   Date:  06/01/2018   ID:  Vincent Kennedy, DOB 1932/07/27, MRN [id number]  PCP:  [facility]  Cardiologist:  [name], MD   Chief Complaint  Patient presents with  . Other    3 month follow up. Meds reviewed by the pt. verbally. Pt. c/o shortness of breath.       History of Present Illness: Vincent Kennedy is a 85 y.o. male who is here today for follow-up visit regarding coronary artery disease and high-grade AV block status post recent pacemaker placement.[name] He is a retired Development worker, international aid.  He had previous first diagonal ostial drug-eluting stent placement in 2008. He was diagnosed with pulmonary fibrosis in 2016. He is on continuous oxygen therapy.  He had worsening exertional dyspnea and underwent a right and left cardiac catheterization in October of 2018 which showed severe one-vessel coronary artery disease with patent diagonal stent, 95% heavily calcified stenosis in the mid LAD at the origin of the second diagonal and moderate proximal LAD stenosis.  Ejection fraction was normal.  Right heart catheterization showed normal filling pressures, mild pulmonary hypertension and normal cardiac output.  I performed successful angioplasty and drug-eluting stent placement to the proximal and mid LAD. Repeat cardiac catheterization in June 2019 showed widely patent LAD and diagonal stents with no significant restenosis.  There was stable 40% ostial LAD stenosis.  LVEDP was mildly elevated. He had an echocardiogram done with [name] in August 2019 which showed normal LV systolic function with mild pulmonary hypertension.  Systolic pulmonary pressure was 36 mmHg.  He presented to the ED on 04/06/2018 for bradycardia and was admitted to the hospital. He was diagnosed with a complete heart block. Dr. 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  The patient is  doing well today. He is accompanied by his son in law today.He believes that a new experimental laser treatment for pulmonary fibrosis could have caused the bradycardia. He completed 3 sessions at [facility], but could not tell if it was improving his breathing.  He had palpitations post pacemaker placement after metoprolol was discontinued and thus it was resumed at 12.5 mg twice daily.   His resting heart rate is now around the 60s and 70s. He is now on 6 L of oxygen. He is still going to [facility] pulmonology for his fibrosis. He denies chest pain but reports continued worsening of dyspnea and wonders if treatment for pulmonary hypertension is warranted.  Past Medical History:  Diagnosis Date  . BPH (benign prostatic hyperplasia)   . Coronary artery disease    a. 11/2006 Cath: D1 99ost (2.5x15 Xience DES), D2 90ost (small); b. 12/2017 PCI: LAD 95 (3.25x8 & 3.0x 33 Sierra DES'. D1 patent stent. D2 30ost. EF 55-65%;  c. 08/2017 Cath: LM min irregs, LAD 40ost, patent stents, D1 patent stent, LCX nl, OM1/2/3 nl, RCA 30ost, RPDA/RPAV/RPL1/2/3 nl.  . DOE (dyspnea on exertion)    a. Chronic supplemental O2 in setting of pulm fibrosis; b. 10/2017 Echo: Nl LV size/fxn. Mild Ao sclerosis. Trace to mild TR.  [name] GERD (gastroesophageal reflux disease)   . Hyperlipidemia   . Hypertension   . On home oxygen therapy    "2-3L; 24/7" (01/19/2017); "6L 24/7" (04/06/2018)  . OSA on CPAP   . Oxygen deficiency   . Pacemaker 04/07/2018  . Pneumonia 07/2016  . Pulmonary fibrosis (HCC) 08/26/2015  . Wears  hearing aid    (sometimes)    Past Surgical History:  Procedure Laterality Date  . CATARACT EXTRACTION W/PHACO Right 01/29/2015   Procedure: CATARACT EXTRACTION PHACO AND INTRAOCULAR LENS PLACEMENT (IOC);  Surgeon: Lockie Mola, MD;  Location: [facility];  Service: Ophthalmology;  Laterality: Right;  RESTOR LENS CPAP  . CATARACT EXTRACTION W/PHACO Left 03/12/2015   Procedure: CATARACT EXTRACTION PHACO AND  INTRAOCULAR LENS PLACEMENT (IOC);  Surgeon: Lockie Mola, MD;  Location: [facility];  Service: Ophthalmology;  Laterality: Left;  RESTOR SHUGARCAINE  . COLONOSCOPY WITH ESOPHAGOGASTRODUODENOSCOPY (EGD)    . CORONARY ANGIOPLASTY WITH STENT PLACEMENT  2010   "1 stent"  . CORONARY ANGIOPLASTY WITH STENT PLACEMENT  01/19/2017  . CORONARY ATHERECTOMY N/A 01/19/2017   Procedure: CORONARY ATHERECTOMY - CSI;  Surgeon: Iran Ouch, MD;  [facility];  Service: Cardiovascular;  Laterality: N/A;  unable to cross lesion  . CORONARY STENT INTERVENTION N/A 01/19/2017   Procedure: CORONARY STENT INTERVENTION;  Surgeon: Iran Ouch, MD;  [facility]: [facility];  Service: Cardiovascular;  Laterality: N/A;  . INGUINAL HERNIA REPAIR Bilateral   . LEFT HEART CATH AND CORONARY ANGIOGRAPHY Left 09/12/2017   Procedure: LEFT HEART CATH AND CORONARY ANGIOGRAPHY;  Surgeon: Iran Ouch, MD;  Location: [facility] INVASIVE CV LAB;  Service: Cardiovascular;  Laterality: Left;  . PACEMAKER IMPLANT N/A 04/07/2018   Procedure: PACEMAKER IMPLANT;  Surgeon: Marinus Maw, MD;  Location: [facility] INVASIVE CV LAB;  Service: Cardiovascular;  Laterality: N/A;  . RIGHT/LEFT HEART CATH AND CORONARY ANGIOGRAPHY N/A 01/12/2017   Procedure: RIGHT/LEFT HEART CATH AND CORONARY ANGIOGRAPHY;  Surgeon: Iran Ouch, MD;  [facility];  Service: Cardiovascular;  Laterality: N/A;     Current Outpatient Medications  Medication Sig Dispense Refill  . ACCU-CHEK FASTCLIX LANCETS MISC Use as directed twice day prn diag e11.65 100 each 1  . aspirin EC 81 MG EC tablet Take 1 tablet (81 mg total) by mouth at bedtime. 90 tablet 3  . budesonide (PULMICORT) 0.5 MG/2ML nebulizer solution USE 1 VIAL VIA NEBULIZER  TWO TIMES DAILY (Patient taking differently: Take 0.5 mg by nebulization 2 (two) times daily. ) 360 mL 11  . clopidogrel (PLAVIX) 75 MG tablet TAKE 1 TABLET BY MOUTH  DAILY  (Patient taking differently: Take 75 mg by mouth daily. ) 90 tablet 1  . finasteride (PROSCAR) 5 MG tablet TAKE 1 TABLET BY MOUTH  DAILY (Patient taking differently: Take 10 mg by mouth daily. ) 90 tablet 1  . glucose blood (ACCU-CHEK GUIDE) test strip Use as instructed twice a day prn diag e11.65 100 each 1  . metoprolol tartrate (LOPRESSOR) 25 MG tablet TAKE 1 TABLET BY MOUTH TWO  TIMES DAILY (Patient taking differently: Take 12.5 mg by mouth 2 (two) times daily. ) 180 tablet 3  . mometasone-formoterol (DULERA) 100-5 MCG/ACT AERO Inhale 2 puffs into the lungs 2 (two) times daily. 1 Inhaler 11  . nitroGLYCERIN (NITROSTAT) 0.4 MG SL tablet Place 1 tablet (0.4 mg total) under the tongue every 5 (five) minutes as needed for chest pain. 25 tablet 12  . OXYGEN Inhale 6 L into the lungs continuous.    . pantoprazole (PROTONIX) 40 MG tablet Take 1 tablet (40 mg total) by mouth daily as needed (heartburn). 90 tablet 2  . Pirfenidone (ESBRIET) 267 MG CAPS Take 801 mg by mouth 3 (three) times daily.     . rivastigmine (EXELON) 9.5 mg/24hr Place 1  patch (9.5 mg total) onto the skin daily. 90 patch 2  . rosuvastatin (CRESTOR) 40 MG tablet TAKE 1 TABLET BY MOUTH  EVERY DAY 90 tablet 3  . zolpidem (AMBIEN) 10 MG tablet Take 1 tablet (10 mg total) by mouth at bedtime as needed. 90 tablet 1   No current facility-administered medications for this visit.     Allergies:   Patient has no known allergies.    Social History:  The patient  reports that he has never smoked. He has never used smokeless tobacco. He reports that he does not drink alcohol or use drugs.   Family History:  The patient's family history includes CAD in his brother and father.    ROS:  Please see the history of present illness.   Otherwise, review of systems are positive for none.   All other systems are reviewed and negative.    PHYSICAL EXAM: VS:  BP (!) 118/58 (BP Location: Left Arm, Patient Position: Sitting, Cuff Size: Normal)    Pulse 70   Ht 5\' 6"  (1.676 m)   Wt 151 lb (68.5 kg)   BMI 24.37 kg/m  , BMI Body mass index is 24.37 kg/m. GEN: Well nourished, well developed, in no acute distress  HEENT: normal  Neck: no JVD, carotid bruits, or masses Cardiac: RRR; no murmurs, rubs, or gallops,no edema Respiratory: clear to auscultation bilaterality, normal work of breathing  GI: soft, nontender, nondistended, + BS MS: no deformity or atrophy  Skin: warm and dry, no rash Neuro:  Strength and sensation are intact Psych: euthymic mood, full affect   EKG:  EKG is ordered today. The ekg ordered today demonstrates atrial sensed ventricular paced rhythm with a heart rate of 70 bpm.  Recent Labs: 04/06/2018: ALT 16; Hemoglobin 13.3; Magnesium 2.1; Platelets 168; TSH 1.767 04/07/2018: BUN 14; Creatinine, Ser 0.89; Potassium 4.0; Sodium 140    Lipid Panel    Component Value Date/Time   CHOL 102 04/06/2018 2351   CHOL 131 03/11/2017 1055   TRIG 38 04/06/2018 2351   HDL 30 (L) 04/06/2018 2351   HDL 38 (L) 03/11/2017 1055   CHOLHDL 3.4 04/06/2018 2351   VLDL 8 04/06/2018 2351   LDLCALC 64 04/06/2018 2351   LDLCALC 73 03/11/2017 1055      Wt Readings from Last 3 Encounters:  06/01/18 151 lb (68.5 kg)  04/07/18 148 lb 5.9 oz (67.3 kg)  04/06/18 151 lb (68.5 kg)     No flowsheet data found.    ASSESSMENT AND PLAN:  1.  Coronary artery disease involving native coronary arteries without angina: He is doing very well at the present time.  Cardiac catheterization in June 2019 showed patent stents with no obstructive disease.  Continue dual antiplatelet therapy.  2.  High-grade AV block status post recent pacemaker placement: The pacemaker seems to be functioning normally.  2. Hyperlipidemia: Currently on rosuvastatin 40 mg once daily.   Most recent LDL was 64  3. Pulmonary fibrosis: Managed by [facility] and Dr. Welton FlakesKhan.  The patient is considering treatment for pulmonary hypertension but I do not think his  pulmonary hypertension is severe enough to warrant this.  I am going to repeat his echocardiogram to evaluate.  4. Essential hypertension: Blood pressure is well controlled on metoprolol and amlodipine.   Disposition:   FU with me in 4 months  I, Jesus Reyes am acting as a Neurosurgeonscribe for Lorine BearsMuhammad Travonne Schowalter, M.D.  I have reviewed the above documentation for accuracy and completeness,  and I agree with the above.   Signed, [name], MD 06/01/18 [facility] [place], [place] [phone]

## 2018-06-01 ENCOUNTER — Encounter: Payer: Self-pay | Admitting: Cardiovascular Disease

## 2018-06-01 ENCOUNTER — Ambulatory Visit (INDEPENDENT_AMBULATORY_CARE_PROVIDER_SITE_OTHER): Payer: Medicare Other | Admitting: Cardiovascular Disease

## 2018-06-01 ENCOUNTER — Other Ambulatory Visit: Payer: Self-pay

## 2018-06-01 VITALS — BP 118/58 | HR 70 | Ht 66.0 in | Wt 151.0 lb

## 2018-06-01 DIAGNOSIS — J8410 Pulmonary fibrosis, unspecified: Secondary | ICD-10-CM | POA: Diagnosis not present

## 2018-06-01 DIAGNOSIS — R0602 Shortness of breath: Secondary | ICD-10-CM | POA: Diagnosis not present

## 2018-06-01 DIAGNOSIS — E785 Hyperlipidemia, unspecified: Secondary | ICD-10-CM

## 2018-06-01 DIAGNOSIS — I272 Pulmonary hypertension, unspecified: Secondary | ICD-10-CM

## 2018-06-01 DIAGNOSIS — I10 Essential (primary) hypertension: Secondary | ICD-10-CM

## 2018-06-01 DIAGNOSIS — Z950 Presence of cardiac pacemaker: Secondary | ICD-10-CM

## 2018-06-01 DIAGNOSIS — I2510 Atherosclerotic heart disease of native coronary artery without angina pectoris: Secondary | ICD-10-CM

## 2018-06-01 NOTE — Patient Instructions (Signed)
Medication Instructions:  No changes If you need a refill on your cardiac medications before your next appointment, please call your pharmacy.   Lab work: None ordered  Testing/Procedures: Your physician has requested that you have an echocardiogram. Echocardiography is a painless test that uses sound waves to create images of your heart. It provides your doctor with information about the size and shape of your heart and how well your heart's chambers and valves are working. You may receive an ultrasound enhancing agent through an IV if needed to better visualize your heart during the echo.This procedure takes approximately one hour. There are no restrictions for this procedure. This will take place at the [facility] clinic.    Follow-Up: At [facility], you and your health needs are our priority.  As part of our continuing mission to provide you with exceptional heart care, we have created designated Provider Care Teams.  These Care Teams include your primary Cardiologist (physician) and Advanced Practice Providers (APPs -  Physician Assistants and Nurse Practitioners) who all work together to provide you with the care you need, when you need it. You will need a follow up appointment in 4 months.  Please call our office 2 months in advance to schedule this appointment.  You may see [name], MD or one of the following Advanced Practice Providers on your designated Care Team:   Nicolasa Ducking, NP Eula Listen, PA-C . Marisue Ivan, PA-C

## 2018-06-05 ENCOUNTER — Other Ambulatory Visit: Payer: Self-pay

## 2018-06-05 MED ORDER — MONTELUKAST SODIUM 10 MG PO TABS: 10.0000 mg | ORAL_TABLET | Freq: Every day | ORAL | 3 refills | Status: DC

## 2018-06-06 ENCOUNTER — Telehealth: Payer: Self-pay | Admitting: *Deleted

## 2018-06-08 MED ORDER — FINASTERIDE 5 MG PO TABS: 5.0000 mg | ORAL_TABLET | Freq: Every day | ORAL | 3 refills | Status: DC

## 2018-06-08 NOTE — Telephone Encounter (Signed)
Dr. [name],   Is Proscar something you would refill for this patient. It looks like it was refilled under you in 02/2018?  Just wanted to check.  Thank you!

## 2018-06-08 NOTE — Telephone Encounter (Signed)
I agreed to refill this for him because I do not think he is seeing a primary care physician right now.

## 2018-06-08 NOTE — Telephone Encounter (Signed)
Proscar RX refilled for #90 w/ 3 refills.

## 2018-06-16 ENCOUNTER — Encounter: Payer: Self-pay | Admitting: Internal Medicine

## 2018-06-16 DIAGNOSIS — J440 Chronic obstructive pulmonary disease with acute lower respiratory infection: Principal | ICD-10-CM

## 2018-06-16 DIAGNOSIS — J209 Acute bronchitis, unspecified: Secondary | ICD-10-CM

## 2018-06-16 MED ORDER — AZITHROMYCIN 250 MG PO TABS: ORAL_TABLET | ORAL | 0 refills | Status: DC

## 2018-06-21 DIAGNOSIS — J9601 Acute respiratory failure with hypoxia: Secondary | ICD-10-CM | POA: Diagnosis not present

## 2018-06-21 DIAGNOSIS — R062 Wheezing: Secondary | ICD-10-CM | POA: Diagnosis not present

## 2018-06-21 DIAGNOSIS — J84115 Respiratory bronchiolitis interstitial lung disease: Secondary | ICD-10-CM | POA: Diagnosis not present

## 2018-06-29 ENCOUNTER — Other Ambulatory Visit: Payer: Medicare Other

## 2018-07-04 ENCOUNTER — Telehealth: Payer: Self-pay

## 2018-07-04 NOTE — Telephone Encounter (Signed)
Left VM notifying Pt of office appt day and time change.  Pt also has mychart.

## 2018-07-10 ENCOUNTER — Other Ambulatory Visit: Payer: Self-pay

## 2018-07-10 ENCOUNTER — Ambulatory Visit (INDEPENDENT_AMBULATORY_CARE_PROVIDER_SITE_OTHER): Payer: Medicare Other | Admitting: *Deleted

## 2018-07-10 DIAGNOSIS — I442 Atrioventricular block, complete: Secondary | ICD-10-CM | POA: Diagnosis not present

## 2018-07-11 ENCOUNTER — Encounter: Payer: Medicare Other | Admitting: Internal Medicine

## 2018-07-11 ENCOUNTER — Telehealth: Payer: Self-pay

## 2018-07-11 LAB — CUP PACEART REMOTE DEVICE CHECK
Battery Remaining Longevity: 74 mo
Battery Voltage: 3.09 V
Brady Statistic AP VP Percent: 28.61 %
Brady Statistic AP VS Percent: 0.03 %
Brady Statistic AS VP Percent: 71.28 %
Brady Statistic AS VS Percent: 0.08 %
Brady Statistic RA Percent Paced: 28.59 %
Brady Statistic RV Percent Paced: 99.89 %
Date Time Interrogation Session: 20200421134807
Implantable Lead Implant Date: 20200117
Implantable Lead Implant Date: 20200117
Implantable Lead Location: 753859
Implantable Lead Location: 753860
Implantable Lead Model: 3830
Implantable Lead Model: 5076
Implantable Pulse Generator Implant Date: 20200117
Lead Channel Impedance Value: 361 Ohm
Lead Channel Impedance Value: 380 Ohm
Lead Channel Impedance Value: 399 Ohm
Lead Channel Impedance Value: 513 Ohm
Lead Channel Pacing Threshold Amplitude: 0.375 V
Lead Channel Pacing Threshold Amplitude: 1 V
Lead Channel Pacing Threshold Pulse Width: 0.4 ms
Lead Channel Pacing Threshold Pulse Width: 0.4 ms
Lead Channel Sensing Intrinsic Amplitude: 1.75 mV
Lead Channel Sensing Intrinsic Amplitude: 1.75 mV
Lead Channel Sensing Intrinsic Amplitude: 13.375 mV
Lead Channel Sensing Intrinsic Amplitude: 13.375 mV
Lead Channel Setting Pacing Amplitude: 2.75 V
Lead Channel Setting Pacing Amplitude: 3.5 V
Lead Channel Setting Pacing Pulse Width: 1 ms
Lead Channel Setting Sensing Sensitivity: 1.2 mV

## 2018-07-11 NOTE — Telephone Encounter (Signed)
Left message for patient to remind of missed remote transmission.  

## 2018-07-17 ENCOUNTER — Encounter: Payer: Self-pay | Admitting: Cardiology

## 2018-07-17 NOTE — Progress Notes (Signed)
Remote pacemaker transmission.   

## 2018-07-21 DIAGNOSIS — J9601 Acute respiratory failure with hypoxia: Secondary | ICD-10-CM | POA: Diagnosis not present

## 2018-07-21 DIAGNOSIS — J84115 Respiratory bronchiolitis interstitial lung disease: Secondary | ICD-10-CM | POA: Diagnosis not present

## 2018-07-21 DIAGNOSIS — R062 Wheezing: Secondary | ICD-10-CM | POA: Diagnosis not present

## 2018-07-26 ENCOUNTER — Other Ambulatory Visit: Payer: Self-pay

## 2018-07-26 ENCOUNTER — Telehealth: Payer: Self-pay | Admitting: Internal Medicine

## 2018-07-26 ENCOUNTER — Ambulatory Visit: Payer: Medicare Other | Admitting: Internal Medicine

## 2018-07-26 VITALS — BP 124/56 | HR 78 | Ht 66.0 in | Wt 150.0 lb

## 2018-07-26 DIAGNOSIS — Z950 Presence of cardiac pacemaker: Secondary | ICD-10-CM | POA: Diagnosis not present

## 2018-07-26 DIAGNOSIS — I442 Atrioventricular block, complete: Secondary | ICD-10-CM | POA: Diagnosis not present

## 2018-07-26 LAB — CUP PACEART INCLINIC DEVICE CHECK
Battery Remaining Longevity: 108 mo
Battery Voltage: 3.07 V
Brady Statistic AP VP Percent: 29.21 %
Brady Statistic AP VS Percent: 0.03 %
Brady Statistic AS VP Percent: 70.67 %
Brady Statistic AS VS Percent: 0.09 %
Brady Statistic RA Percent Paced: 29.2 %
Brady Statistic RV Percent Paced: 99.89 %
Date Time Interrogation Session: 20200506153836
Implantable Lead Implant Date: 20200117
Implantable Lead Implant Date: 20200117
Implantable Lead Location: 753859
Implantable Lead Location: 753860
Implantable Lead Model: 3830
Implantable Lead Model: 5076
Implantable Pulse Generator Implant Date: 20200117
Lead Channel Impedance Value: 361 Ohm
Lead Channel Impedance Value: 399 Ohm
Lead Channel Impedance Value: 418 Ohm
Lead Channel Impedance Value: 532 Ohm
Lead Channel Pacing Threshold Amplitude: 0.5 V
Lead Channel Pacing Threshold Amplitude: 0.875 V
Lead Channel Pacing Threshold Pulse Width: 0.4 ms
Lead Channel Pacing Threshold Pulse Width: 0.4 ms
Lead Channel Sensing Intrinsic Amplitude: 1.625 mV
Lead Channel Sensing Intrinsic Amplitude: 1.625 mV
Lead Channel Sensing Intrinsic Amplitude: 14.625 mV
Lead Channel Sensing Intrinsic Amplitude: 15.875 mV
Lead Channel Setting Pacing Amplitude: 1.5 V
Lead Channel Setting Pacing Amplitude: 2.5 V
Lead Channel Setting Pacing Pulse Width: 1 ms
Lead Channel Setting Sensing Sensitivity: 1.2 mV

## 2018-07-26 NOTE — Telephone Encounter (Signed)
New Message   Patient has appointment today and has been waiting for the call.  Please call patient back.

## 2018-07-26 NOTE — Telephone Encounter (Signed)
[facility] had previously confirmed Pt to come to office for appt.  We will call Pt to try to schedule to COME TO OFFICE later today.  Pt is 91 day post op HIS bundle.

## 2018-07-26 NOTE — Patient Instructions (Addendum)
Medication Instructions:  Your physician recommends that you continue on your current medications as directed. Please refer to the Current Medication list given to you today.  Labwork: None ordered.  Testing/Procedures: None ordered.  Follow-Up: Your physician wants you to follow-up in: 9 months with Dr. Graciela Husbands in [place].   You will receive a reminder letter in the mail two months in advance. If you don't receive a letter, please call our office to schedule the follow-up appointment.  Remote monitoring is used to monitor your Pacemaker from home. This monitoring reduces the number of office visits required to check your device to one time per year. It allows [place] to keep an eye on the functioning of your device to ensure it is working properly. You are scheduled for a device check from home on 10/09/2018. You may send your transmission at any time that day. If you have a wireless device, the transmission will be sent automatically. After your physician reviews your transmission, you will receive a postcard with your next transmission date.  Any Other Special Instructions Will Be Listed Below (If Applicable).  If you need a refill on your cardiac medications before your next appointment, please call your pharmacy.

## 2018-07-27 ENCOUNTER — Encounter: Payer: Self-pay | Admitting: Internal Medicine

## 2018-07-27 NOTE — Progress Notes (Signed)
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  In the interim, his activity has been limited by dyspnea secondary to his pulmonary fibrosis.  He denies syncope.  He has mild peripheral edema.  He wears 6 L of oxygen.  He had no problems healing up his pacemaker incision. No Known Allergies   Current Outpatient Medications  Medication Sig Dispense Refill  . ACCU-CHEK FASTCLIX LANCETS MISC Use as directed twice day prn diag e11.65 100 each 1  . aspirin EC 81 MG EC tablet Take 1 tablet (81 mg total) by mouth at bedtime. 90 tablet 3  . budesonide (PULMICORT) 0.5 MG/2ML nebulizer solution USE 1 VIAL VIA NEBULIZER  TWO TIMES DAILY (Patient taking differently: Take 0.5 mg by nebulization 2 (two) times daily. ) 360 mL 11  . clopidogrel (PLAVIX) 75 MG tablet TAKE 1 TABLET BY MOUTH  DAILY (Patient taking differently: Take 75 mg by mouth daily. ) 90 tablet 1  . finasteride (PROSCAR) 5 MG tablet Take 1 tablet (5 mg total) by mouth daily. 90 tablet 3  . glucose blood (ACCU-CHEK GUIDE) test strip Use as instructed twice a day prn diag e11.65 100 each 1  . metoprolol tartrate (LOPRESSOR) 25 MG tablet TAKE 1 TABLET BY MOUTH TWO  TIMES DAILY (Patient taking differently: Take 12.5 mg by mouth 2 (two) times daily. ) 180 tablet 3  . mirtazapine (REMERON) 30 MG tablet Take 30 mg by mouth at bedtime.    . mometasone-formoterol (DULERA) 100-5 MCG/ACT AERO Inhale 2 puffs into the lungs 2 (two) times daily. 1 Inhaler 11  . montelukast (SINGULAIR) 10 MG tablet Take 1 tablet (10 mg total) by mouth at bedtime. 90 tablet 3  . nitroGLYCERIN (NITROSTAT) 0.4 MG SL tablet Place 1 tablet (0.4 mg total) under the tongue every 5 (five) minutes as needed for chest pain. 25 tablet 12  . OXYGEN Inhale 6 L into the lungs continuous.    . pantoprazole (PROTONIX) 40 MG tablet Take 1 tablet (40 mg total)  by mouth daily as needed (heartburn). 90 tablet 2  . Pirfenidone (ESBRIET) 267 MG CAPS Take 801 mg by mouth 3 (three) times daily.     . rivastigmine (EXELON) 9.5 mg/24hr Place 1 patch (9.5 mg total) onto the skin daily. 90 patch 2  . rosuvastatin (CRESTOR) 40 MG tablet TAKE 1 TABLET BY MOUTH  EVERY DAY 90 tablet 3  . zolpidem (AMBIEN) 10 MG tablet Take 1 tablet (10 mg total) by mouth at bedtime as needed. 90 tablet 1   No current facility-administered medications for this visit.      Past Medical History:  Diagnosis Date  . BPH (benign prostatic hyperplasia)   . Coronary artery disease    a. 11/2006 Cath: D1 99ost (2.5x15 Xience DES), D2 90ost (small); b. 12/2017 PCI: LAD 95 (3.25x8 & 3.0x 33 Sierra DES'. D1 patent stent. D2 30ost. EF 55-65%;  c. 08/2017 Cath: LM min irregs, LAD 40ost, patent stents, D1 patent stent, LCX nl, OM1/2/3 nl, RCA 30ost, RPDA/RPAV/RPL1/2/3 nl.  . DOE (dyspnea on exertion)    a. Chronic supplemental O2 in setting of pulm fibrosis; b. 10/2017 Echo: Nl LV size/fxn. Mild Ao sclerosis. Trace to mild TR.  [name]. GERD (gastroesophageal reflux disease)   . Hyperlipidemia   . Hypertension   . On home oxygen therapy    "2-3L; 24/7" (01/19/2017); "6L 24/7" (04/06/2018)  .  OSA on CPAP   . Oxygen deficiency   . Pacemaker 04/07/2018  . Pneumonia 07/2016  . Pulmonary fibrosis (HCC) 08/26/2015  . Wears hearing aid    (sometimes)    ROS:   All systems reviewed and negative except as noted in the HPI.   Past Surgical History:  Procedure Laterality Date  . CATARACT EXTRACTION W/PHACO Right 01/29/2015   Procedure: CATARACT EXTRACTION PHACO AND INTRAOCULAR LENS PLACEMENT (IOC);  Surgeon: Lockie Mola, MD;  Location: [facility];  Service: Ophthalmology;  Laterality: Right;  RESTOR LENS CPAP  . CATARACT EXTRACTION W/PHACO Left 03/12/2015   Procedure: CATARACT EXTRACTION PHACO AND INTRAOCULAR LENS PLACEMENT (IOC);  Surgeon: Lockie Mola, MD;  Location: [facility];  Service: Ophthalmology;  Laterality: Left;  RESTOR SHUGARCAINE  . COLONOSCOPY WITH ESOPHAGOGASTRODUODENOSCOPY (EGD)    . CORONARY ANGIOPLASTY WITH STENT PLACEMENT  2010   "1 stent"  . CORONARY ANGIOPLASTY WITH STENT PLACEMENT  01/19/2017  . CORONARY ATHERECTOMY N/A 01/19/2017   Procedure: CORONARY ATHERECTOMY - CSI;  Surgeon: Iran Ouch, MD;  [facility];  Service: Cardiovascular;  Laterality: N/A;  unable to cross lesion  . CORONARY STENT INTERVENTION N/A 01/19/2017   Procedure: CORONARY STENT INTERVENTION;  Surgeon: Iran Ouch, MD;  [facility]: [facility];  Service: Cardiovascular;  Laterality: N/A;  . INGUINAL HERNIA REPAIR Bilateral   . LEFT HEART CATH AND CORONARY ANGIOGRAPHY Left 09/12/2017   Procedure: LEFT HEART CATH AND CORONARY ANGIOGRAPHY;  Surgeon: Iran Ouch, MD;  Location: [facility] INVASIVE CV LAB;  Service: Cardiovascular;  Laterality: Left;  . PACEMAKER IMPLANT N/A 04/07/2018   Procedure: PACEMAKER IMPLANT;  Surgeon: Marinus Maw, MD;  Location: [facility] INVASIVE CV LAB;  Service: Cardiovascular;  Laterality: N/A;  . RIGHT/LEFT HEART CATH AND CORONARY ANGIOGRAPHY N/A 01/12/2017   Procedure: RIGHT/LEFT HEART CATH AND CORONARY ANGIOGRAPHY;  Surgeon: Iran Ouch, MD;  [facility]: [facility];  Service: Cardiovascular;  Laterality: N/A;     Family History  Problem Relation Age of Onset  . CAD Father   . CAD Brother      Social History   Socioeconomic History  . Marital status: Married    Spouse name: Not on file  . Number of children: Not on file  . Years of education: Not on file  . Highest education level: Not on file  Occupational History  . Occupation: retired  Engineer, production  . Financial resource strain: Not on file  . Food insecurity:    Worry: Not on file    Inability: Not on file  . Transportation needs:    Medical: Not on file    Non-medical: Not on file  Tobacco Use  . Smoking status: Never  Smoker  . Smokeless tobacco: Never Used  Substance and Sexual Activity  . Alcohol use: No    Alcohol/week: 0.0 standard drinks  . Drug use: No  . Sexual activity: Yes    Comment: patient states minor  Lifestyle  . Physical activity:    Days per week: Not on file    Minutes per session: Not on file  . Stress: Not on file  Relationships  . Social connections:    Talks on phone: Not on file    Gets together: Not on file    Attends religious service: Not on file    Active member of club or organization: Not on file    Attends meetings of clubs or organizations: Not on file  Relationship status: Not on file  . Intimate partner violence:    Fear of current or ex partner: Not on file    Emotionally abused: Not on file    Physically abused: Not on file    Forced sexual activity: Not on file  Other Topics Concern  . Not on file  Social History Narrative   Retired Development worker, international aid. Lives in [place] with wife.     BP (!) 124/56   Pulse 78   Ht  (1.676 m)   Wt 150 lb (68 kg)   BMI 24.21 kg/m   Physical Exam:  Chronically ill appearing 85 year old man, NAD HEENT: Unremarkable Neck: 7 cm JVD, no thyromegally Lymphatics:  No adenopathy Back:  No CVA tenderness Lungs:  Clear with reduced breath sounds and scattered fine rales bilaterally HEART:  Regular rate rhythm, no murmurs, no rubs, no clicks Abd:  soft, positive bowel sounds, no organomegally, no rebound, no guarding Ext:  2 plus pulses, no edema, no cyanosis, no clubbing Skin:  No rashes no nodules Neuro:  CN II through XII intact, motor grossly intact  DEVICE  Normal device function.  See PaceArt for details.   Assess/Plan: 1.  Complete heart block -he is status post pacemaker and asymptomatic. 2.  Pacemaker -his Medtronic dual-chamber pacemaker has been interrogated today and is working normally.  He is pacing in the ventricle essentially 100% of the time.  He has developed an underlying escape rhythm.  He  will be reprogrammed to allow for intrinsic conduction for now. 3.  Pulmonary fibrosis -he appears to have a advanced case and is on high-dose oxygen therapy.  He will follow-up at [facility].  Lewayne Bunting, MD

## 2018-08-06 ENCOUNTER — Other Ambulatory Visit: Payer: Self-pay | Admitting: Cardiovascular Disease

## 2018-08-10 ENCOUNTER — Ambulatory Visit
Admission: RE | Admit: 2018-08-10 | Discharge: 2018-08-10 | Disposition: A | Discharge status: Home / Self Care | Payer: Medicare Other | Attending: Internal Medicine | Admitting: Internal Medicine

## 2018-08-10 ENCOUNTER — Other Ambulatory Visit: Payer: Self-pay

## 2018-08-10 ENCOUNTER — Ambulatory Visit
Admission: RE | Admit: 2018-08-10 | Discharge: 2018-08-10 | Disposition: A | Discharge status: Home / Self Care | Payer: Medicare Other | Source: Ambulatory Visit | Attending: Internal Medicine | Admitting: Internal Medicine

## 2018-08-10 ENCOUNTER — Other Ambulatory Visit: Payer: Self-pay | Admitting: Internal Medicine

## 2018-08-10 DIAGNOSIS — R0602 Shortness of breath: Secondary | ICD-10-CM | POA: Diagnosis not present

## 2018-08-10 DIAGNOSIS — J440 Chronic obstructive pulmonary disease with acute lower respiratory infection: Secondary | ICD-10-CM | POA: Insufficient documentation

## 2018-08-10 DIAGNOSIS — J209 Acute bronchitis, unspecified: Secondary | ICD-10-CM

## 2018-08-10 MED ORDER — AZITHROMYCIN 250 MG PO TABS: ORAL_TABLET | ORAL | 0 refills | Status: DC

## 2018-08-18 ENCOUNTER — Other Ambulatory Visit: Payer: Self-pay | Admitting: Nurse Practitioner

## 2018-08-18 DIAGNOSIS — I10 Essential (primary) hypertension: Secondary | ICD-10-CM

## 2018-08-18 DIAGNOSIS — F5101 Primary insomnia: Secondary | ICD-10-CM

## 2018-08-18 MED ORDER — ZALEPLON 5 MG PO CAPS: ORAL_CAPSULE | ORAL | 2 refills | Status: DC

## 2018-08-18 MED ORDER — FUROSEMIDE 20 MG PO TABS: ORAL_TABLET | ORAL | 2 refills | Status: DC

## 2018-08-18 NOTE — Progress Notes (Signed)
Added prescriptions for sonata 43m qhs, repeating once as needed. Also added furosemide 20mg  QOD as directed. [facility]

## 2018-08-21 ENCOUNTER — Telehealth: Payer: Self-pay

## 2018-08-21 DIAGNOSIS — J9601 Acute respiratory failure with hypoxia: Secondary | ICD-10-CM | POA: Diagnosis not present

## 2018-08-21 DIAGNOSIS — J84115 Respiratory bronchiolitis interstitial lung disease: Secondary | ICD-10-CM | POA: Diagnosis not present

## 2018-08-21 DIAGNOSIS — R062 Wheezing: Secondary | ICD-10-CM | POA: Diagnosis not present

## 2018-08-21 NOTE — Telephone Encounter (Signed)
Prior authorization for medication Zaleplon 5MG  capsules. Approvedon May 30 Request Reference Number: [id number]. ZALEPLON CAP 5MG  is approved through 03/22/2019. For further questions, call [phone]. Pharmacy contacted

## 2018-08-28 DIAGNOSIS — J84115 Respiratory bronchiolitis interstitial lung disease: Secondary | ICD-10-CM | POA: Diagnosis not present

## 2018-08-28 DIAGNOSIS — J9601 Acute respiratory failure with hypoxia: Secondary | ICD-10-CM | POA: Diagnosis not present

## 2018-08-28 DIAGNOSIS — R062 Wheezing: Secondary | ICD-10-CM | POA: Diagnosis not present

## 2018-08-28 DIAGNOSIS — J8410 Pulmonary fibrosis, unspecified: Secondary | ICD-10-CM | POA: Diagnosis not present

## 2018-09-01 DIAGNOSIS — J9611 Chronic respiratory failure with hypoxia: Secondary | ICD-10-CM | POA: Diagnosis not present

## 2018-09-01 DIAGNOSIS — J849 Interstitial pulmonary disease, unspecified: Secondary | ICD-10-CM | POA: Diagnosis not present

## 2018-09-01 DIAGNOSIS — J84112 Idiopathic pulmonary fibrosis: Secondary | ICD-10-CM | POA: Diagnosis not present

## 2018-09-01 DIAGNOSIS — R0602 Shortness of breath: Secondary | ICD-10-CM | POA: Diagnosis not present

## 2018-09-01 DIAGNOSIS — I071 Rheumatic tricuspid insufficiency: Secondary | ICD-10-CM | POA: Diagnosis not present

## 2018-09-01 DIAGNOSIS — I2510 Atherosclerotic heart disease of native coronary artery without angina pectoris: Secondary | ICD-10-CM | POA: Diagnosis not present

## 2018-09-07 ENCOUNTER — Ambulatory Visit (INDEPENDENT_AMBULATORY_CARE_PROVIDER_SITE_OTHER): Payer: Medicare Other | Admitting: Internal Medicine

## 2018-09-07 ENCOUNTER — Encounter: Payer: Self-pay | Admitting: Internal Medicine

## 2018-09-07 ENCOUNTER — Other Ambulatory Visit: Payer: Self-pay

## 2018-09-07 ENCOUNTER — Telehealth: Payer: Self-pay

## 2018-09-07 ENCOUNTER — Telehealth: Payer: Self-pay | Admitting: Cardiovascular Disease

## 2018-09-07 VITALS — BP 122/68 | Ht 67.0 in | Wt 156.0 lb

## 2018-09-07 DIAGNOSIS — I2584 Coronary atherosclerosis due to calcified coronary lesion: Secondary | ICD-10-CM | POA: Diagnosis not present

## 2018-09-07 DIAGNOSIS — M329 Systemic lupus erythematosus, unspecified: Secondary | ICD-10-CM | POA: Diagnosis not present

## 2018-09-07 DIAGNOSIS — G4730 Sleep apnea, unspecified: Secondary | ICD-10-CM | POA: Diagnosis not present

## 2018-09-07 DIAGNOSIS — Z9981 Dependence on supplemental oxygen: Secondary | ICD-10-CM | POA: Diagnosis not present

## 2018-09-07 DIAGNOSIS — Z7902 Long term (current) use of antithrombotics/antiplatelets: Secondary | ICD-10-CM | POA: Diagnosis not present

## 2018-09-07 DIAGNOSIS — J84112 Idiopathic pulmonary fibrosis: Secondary | ICD-10-CM | POA: Diagnosis not present

## 2018-09-07 DIAGNOSIS — I2510 Atherosclerotic heart disease of native coronary artery without angina pectoris: Secondary | ICD-10-CM | POA: Diagnosis not present

## 2018-09-07 DIAGNOSIS — I10 Essential (primary) hypertension: Secondary | ICD-10-CM | POA: Diagnosis not present

## 2018-09-07 DIAGNOSIS — J849 Interstitial pulmonary disease, unspecified: Secondary | ICD-10-CM | POA: Diagnosis not present

## 2018-09-07 DIAGNOSIS — I25119 Atherosclerotic heart disease of native coronary artery with unspecified angina pectoris: Secondary | ICD-10-CM | POA: Diagnosis not present

## 2018-09-07 DIAGNOSIS — J9611 Chronic respiratory failure with hypoxia: Secondary | ICD-10-CM

## 2018-09-07 DIAGNOSIS — E785 Hyperlipidemia, unspecified: Secondary | ICD-10-CM | POA: Diagnosis not present

## 2018-09-07 NOTE — Telephone Encounter (Signed)
Patient wife called and cancelled patient echocardiogram. States it was performed at [facility], and asks if Dr. Fletcher Anon can review it.

## 2018-09-07 NOTE — Telephone Encounter (Signed)
Routing to Dr Fletcher Anon to review in [place].

## 2018-09-07 NOTE — Telephone Encounter (Signed)
Faxed wheelchair order to [facility]

## 2018-09-07 NOTE — Progress Notes (Signed)
[facility] [address] [place], [place] [address]  Internal MEDICINE  Telephone Visit  Patient Name: Vincent CoderMohammad A Osentoski  [id number]  [id number]  Date of Service: 09/07/2018  I connected with the patient at 3.25 by telephone and verified the patients identity using two identifiers.   I discussed the limitations, risks, security and privacy concerns of performing an evaluation and management service by telephone and the availability of in person appointments. I also discussed with the patient that there may be a patient responsible charge related to the service.  The patient expressed understanding and agrees to proceed.    Chief Complaint  Patient presents with  . Telephone Assessment  . Telephone Screen  . Follow-up    manual wheelchair light weight  . Shortness of Breath    increased O2 for ILD    HPI  Pt is seen through webcam for prevention of pandemic due to covid 19. Pt has increased sob and his o2 requirement has been increased, now on O2  between 10-14 L nasal cannula. ADL's have been hard. Wife is caregiver and is requesting a manual wheel chair.   Current Medication: Outpatient Encounter Medications as of 09/07/2018  Medication Sig  . ACCU-CHEK FASTCLIX LANCETS MISC Use as directed twice day prn diag e11.65  . aspirin EC 81 MG EC tablet Take 1 tablet (81 mg total) by mouth at bedtime.  . budesonide (PULMICORT) 0.5 MG/2ML nebulizer solution USE 1 VIAL VIA NEBULIZER  TWO TIMES DAILY (Patient taking differently: Take 0.5 mg by nebulization 2 (two) times daily. )  . clopidogrel (PLAVIX) 75 MG tablet TAKE 1 TABLET BY MOUTH  DAILY  . finasteride (PROSCAR) 5 MG tablet Take 1 tablet (5 mg total) by mouth daily.  . furosemide (LASIX) 20 MG tablet Take 1 tablet po QOD as directed  . glucose blood (ACCU-CHEK GUIDE) test strip Use as instructed twice a day prn diag e11.65  . metoprolol tartrate (LOPRESSOR) 25 MG tablet TAKE 1 TABLET BY MOUTH TWO  TIMES DAILY (Patient  taking differently: Take 12.5 mg by mouth 2 (two) times daily. )  . mirtazapine (REMERON) 30 MG tablet Take 30 mg by mouth at bedtime.  . mometasone-formoterol (DULERA) 100-5 MCG/ACT AERO Inhale 2 puffs into the lungs 2 (two) times daily.  . montelukast (SINGULAIR) 10 MG tablet Take 1 tablet (10 mg total) by mouth at bedtime.  . nitroGLYCERIN (NITROSTAT) 0.4 MG SL tablet Place 1 tablet (0.4 mg total) under the tongue every 5 (five) minutes as needed for chest pain.  . OXYGEN Inhale 6 L into the lungs continuous.  . pantoprazole (PROTONIX) 40 MG tablet TAKE 1 TABLET BY MOUTH  DAILY AS NEEDED FOR  HEARTBURN  . Pirfenidone (ESBRIET) 267 MG CAPS Take 801 mg by mouth 3 (three) times daily.   . rivastigmine (EXELON) 9.5 mg/24hr Place 1 patch (9.5 mg total) onto the skin daily.  . rosuvastatin (CRESTOR) 40 MG tablet TAKE 1 TABLET BY MOUTH  EVERY DAY  . zaleplon (SONATA) 5 MG capsule Take 1 tablet po QHS prn. May repeat dose one time prn  . zolpidem (AMBIEN) 10 MG tablet Take 1 tablet (10 mg total) by mouth at bedtime as needed.  [name]. azithromycin (ZITHROMAX) 250 MG tablet Take 1 tab daily for 7 days and then Mon/wed/friday until finished (Patient not taking: Reported on 09/07/2018)   No facility-administered encounter medications on file as of 09/07/2018.     Surgical History: Past Surgical History:  Procedure Laterality Date  .  CATARACT EXTRACTION W/PHACO Right 01/29/2015   Procedure: CATARACT EXTRACTION PHACO AND INTRAOCULAR LENS PLACEMENT (IOC);  Surgeon: Leandrew Koyanagi, MD;  Location: [place];  Service: Ophthalmology;  Laterality: Right;  RESTOR LENS CPAP  . CATARACT EXTRACTION W/PHACO Left 03/12/2015   Procedure: CATARACT EXTRACTION PHACO AND INTRAOCULAR LENS PLACEMENT (IOC);  Surgeon: Leandrew Koyanagi, MD;  Location: [place];  Service: Ophthalmology;  Laterality: Left;  RESTOR SHUGARCAINE  . COLONOSCOPY WITH ESOPHAGOGASTRODUODENOSCOPY (EGD)    . CORONARY ANGIOPLASTY  WITH STENT PLACEMENT  2010   "1 stent"  . CORONARY ANGIOPLASTY WITH STENT PLACEMENT  01/19/2017  . CORONARY ATHERECTOMY N/A 01/19/2017   Procedure: CORONARY ATHERECTOMY - CSI;  Surgeon: Wellington Hampshire, MD;  Location: [place] [facility];  Service: Cardiovascular;  Laterality: N/A;  unable to cross lesion  . CORONARY STENT INTERVENTION N/A 01/19/2017   Procedure: CORONARY STENT INTERVENTION;  Surgeon: Wellington Hampshire, MD;  Location: [place] [facility];  Service: Cardiovascular;  Laterality: N/A;  . INGUINAL HERNIA REPAIR Bilateral   . LEFT HEART CATH AND CORONARY ANGIOGRAPHY Left 09/12/2017   Procedure: LEFT HEART CATH AND CORONARY ANGIOGRAPHY;  Surgeon: Wellington Hampshire, MD;  Location: [place] [facility];  Service: Cardiovascular;  Laterality: Left;  . PACEMAKER IMPLANT N/A 04/07/2018   Procedure: PACEMAKER IMPLANT;  Surgeon: Evans Lance, MD;  Location: [place] [facility];  Service: Cardiovascular;  Laterality: N/A;  . RIGHT/LEFT HEART CATH AND CORONARY ANGIOGRAPHY N/A 01/12/2017   Procedure: RIGHT/LEFT HEART CATH AND CORONARY ANGIOGRAPHY;  Surgeon: Wellington Hampshire, MD;  Location: [place] [facility];  Service: Cardiovascular;  Laterality: N/A;    Medical History: Past Medical History:  Diagnosis Date  . BPH (benign prostatic hyperplasia)   . Coronary artery disease    a. 11/2006 Cath: D1 99ost (2.5x15 Xience DES), D2 90ost (small); b. 12/2017 PCI: LAD 95 (3.25x8 & 3.0x [place]'. D1 patent stent. D2 30ost. EF 55-65%;  c. 08/2017 Cath: LM min irregs, LAD 40ost, patent stents, D1 patent stent, LCX nl, OM1/2/3 nl, RCA 30ost, RPDA/RPAV/RPL1/2/3 nl.  . DOE (dyspnea on exertion)    a. Chronic supplemental O2 in setting of pulm fibrosis; b. 10/2017 Echo: Nl LV size/fxn. Mild Ao sclerosis. Trace to mild TR.  [name] GERD (gastroesophageal reflux disease)   . Hyperlipidemia   . Hypertension   . On home oxygen therapy    "2-3L; 24/7" (01/19/2017); "6L 24/7" (04/06/2018)  . OSA on CPAP   .  Oxygen deficiency   . Pacemaker 04/07/2018  . Pneumonia 07/2016  . Pulmonary fibrosis ([place]) 08/26/2015  . Wears hearing aid    (sometimes)    Family History: Family History  Problem Relation Age of Onset  . CAD Father   . CAD Brother     Social History   Socioeconomic History  . Marital status: Married    Spouse name: Not on file  . Number of children: Not on file  . Years of education: Not on file  . Highest education level: Not on file  Occupational History  . Occupation: retired  Scientific laboratory technician  . Financial resource strain: Not on file  . Food insecurity    Worry: Not on file    Inability: Not on file  . Transportation needs    Medical: Not on file    Non-medical: Not on file  Tobacco Use  . Smoking status: Never Smoker  . Smokeless tobacco: Never Used  Substance and Sexual Activity  . Alcohol  use: No    Alcohol/week: 0.0 standard drinks  . Drug use: No  . Sexual activity: Yes    Comment: patient states minor  Lifestyle  . Physical activity    Days per week: Not on file    Minutes per session: Not on file  . Stress: Not on file  Relationships  . Social Musicianconnections    Talks on phone: Not on file    Gets together: Not on file    Attends religious service: Not on file    Active member of club or organization: Not on file    Attends meetings of clubs or organizations: Not on file    Relationship status: Not on file  . Intimate partner violence    Fear of current or ex partner: Not on file    Emotionally abused: Not on file    Physically abused: Not on file    Forced sexual activity: Not on file  Other Topics Concern  . 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     Review of Systems  Constitutional: Positive for activity change and fatigue.  HENT: Negative for mouth sores.   Eyes: Negative.   Respiratory: Positive for chest tightness and shortness of breath.   Cardiovascular: Positive for  palpitations and leg swelling.  Genitourinary: Positive for frequency.  Neurological: Positive for weakness.  Psychiatric/Behavioral: Positive for agitation.    Vital Signs: BP 122/68   Ht 5\' 7"  (1.702 m)   Wt 156 lb (70.8 kg)   BMI 24.43 kg/m    Observation/Objective: Pt is pleasant to talk with Takes a pause during conversation Has O2   Assessment/Plan: l  General Counseling: Tarus verbalizes understanding of the findings of today's phone visit and agrees with plan of treatment. I have discussed any further diagnostic evaluation that may be needed or ordered today. We also reviewed his medications today. he has been encouraged to call the office with any questions or concerns that should arise related to todays visit. This note serves a face to face encounter for manual w/c  1. IPF (idiopathic pulmonary fibrosis) (HCC) Continue with pulmonary as before  2. Chronic respiratory failure with hypoxia (HCC) Continue o2, ordered manual w/c for ADL's for indoors and assistance with his appointments for caregiver   3. Coronary artery disease due to calcified coronary lesion Per cardiology  4. Oxygen dependent Pt has a concentrator at home   Orders Placed This Encounter  Procedures  . DME Wheelchair manual     Time spent:15 Minutes    Dr Lyndon CodeFozia M Javon Hupfer Internal medicine

## 2018-09-08 NOTE — Telephone Encounter (Signed)
I reviewed the echo report which showed normal LV systolic function and no significant valvular abnormalities.  However, they are describing an atrial shunt.  If this shunt is significant, it might be causing more hypoxia.  I need to be able to review the images myself.  Can you please check with them and see if they have the study on a CD or otherwise we have to find a way so that I review the images myself.

## 2018-09-11 DIAGNOSIS — E785 Hyperlipidemia, unspecified: Secondary | ICD-10-CM | POA: Diagnosis not present

## 2018-09-11 DIAGNOSIS — J84112 Idiopathic pulmonary fibrosis: Secondary | ICD-10-CM | POA: Diagnosis not present

## 2018-09-11 DIAGNOSIS — I25119 Atherosclerotic heart disease of native coronary artery with unspecified angina pectoris: Secondary | ICD-10-CM | POA: Diagnosis not present

## 2018-09-11 DIAGNOSIS — G4730 Sleep apnea, unspecified: Secondary | ICD-10-CM | POA: Diagnosis not present

## 2018-09-11 DIAGNOSIS — Z9981 Dependence on supplemental oxygen: Secondary | ICD-10-CM | POA: Diagnosis not present

## 2018-09-11 DIAGNOSIS — I10 Essential (primary) hypertension: Secondary | ICD-10-CM | POA: Diagnosis not present

## 2018-09-11 DIAGNOSIS — Z7902 Long term (current) use of antithrombotics/antiplatelets: Secondary | ICD-10-CM | POA: Diagnosis not present

## 2018-09-11 DIAGNOSIS — M329 Systemic lupus erythematosus, unspecified: Secondary | ICD-10-CM | POA: Diagnosis not present

## 2018-09-11 NOTE — Telephone Encounter (Signed)
No answer. Left message to call back so I can see if they have the images.

## 2018-09-11 NOTE — Telephone Encounter (Signed)
Patient's wife is calling back. She verbalized understanding of Dr Tyrell Antonio review of echo report. She said that's what they were told Friday when they called from [facility]. They were also told they wanted to do some other testing but she does not remember what it was. She does not have the images at this time and has not actually spoke with the doctor at [facility]. She is awaiting a call from them to schedule follow up and further testing.  I will call [facility] on Wednesday, when back in the office to see about obtaining images from Medical Records.  She was very Patent attorney.

## 2018-09-13 DIAGNOSIS — M329 Systemic lupus erythematosus, unspecified: Secondary | ICD-10-CM | POA: Diagnosis not present

## 2018-09-13 DIAGNOSIS — E785 Hyperlipidemia, unspecified: Secondary | ICD-10-CM | POA: Diagnosis not present

## 2018-09-13 DIAGNOSIS — I10 Essential (primary) hypertension: Secondary | ICD-10-CM | POA: Diagnosis not present

## 2018-09-13 DIAGNOSIS — Z7902 Long term (current) use of antithrombotics/antiplatelets: Secondary | ICD-10-CM | POA: Diagnosis not present

## 2018-09-13 DIAGNOSIS — I25119 Atherosclerotic heart disease of native coronary artery with unspecified angina pectoris: Secondary | ICD-10-CM | POA: Diagnosis not present

## 2018-09-13 DIAGNOSIS — J84112 Idiopathic pulmonary fibrosis: Secondary | ICD-10-CM | POA: Diagnosis not present

## 2018-09-13 DIAGNOSIS — G4730 Sleep apnea, unspecified: Secondary | ICD-10-CM | POA: Diagnosis not present

## 2018-09-13 DIAGNOSIS — Z9981 Dependence on supplemental oxygen: Secondary | ICD-10-CM | POA: Diagnosis not present

## 2018-09-15 ENCOUNTER — Other Ambulatory Visit: Payer: Medicare Other

## 2018-09-15 NOTE — Telephone Encounter (Signed)
Called [facility] Echo lab at [phone]. After being transferred to several different people and finally getting through. I talked to technician in Echo lab. He will fax me the release of medical records for patient to sign.  Then I can fax back to [phone].

## 2018-09-19 DIAGNOSIS — Z9981 Dependence on supplemental oxygen: Secondary | ICD-10-CM | POA: Diagnosis not present

## 2018-09-19 DIAGNOSIS — I10 Essential (primary) hypertension: Secondary | ICD-10-CM | POA: Diagnosis not present

## 2018-09-19 DIAGNOSIS — I25119 Atherosclerotic heart disease of native coronary artery with unspecified angina pectoris: Secondary | ICD-10-CM | POA: Diagnosis not present

## 2018-09-19 DIAGNOSIS — M329 Systemic lupus erythematosus, unspecified: Secondary | ICD-10-CM | POA: Diagnosis not present

## 2018-09-19 DIAGNOSIS — J84112 Idiopathic pulmonary fibrosis: Secondary | ICD-10-CM | POA: Diagnosis not present

## 2018-09-19 DIAGNOSIS — Z7902 Long term (current) use of antithrombotics/antiplatelets: Secondary | ICD-10-CM | POA: Diagnosis not present

## 2018-09-19 DIAGNOSIS — E785 Hyperlipidemia, unspecified: Secondary | ICD-10-CM | POA: Diagnosis not present

## 2018-09-19 DIAGNOSIS — G4730 Sleep apnea, unspecified: Secondary | ICD-10-CM | POA: Diagnosis not present

## 2018-09-21 NOTE — Telephone Encounter (Signed)
Did not receive the release of medical records from [facility]. Called patient and advised I would mail a release form to him which should make it to him by next week.  He will then sign and then mail back or drop off to our office.  The mail has already gone out today, so I will mail the form to him on Monday.

## 2018-09-25 NOTE — Telephone Encounter (Signed)
Release of Information form filled out with necessary information for [facility] to release Echo images on CD or USB to Dr Fletcher Anon. Form mailed to patient with instructions to sign and return by fax, mail, or drop by the office.

## 2018-09-26 DIAGNOSIS — I10 Essential (primary) hypertension: Secondary | ICD-10-CM | POA: Diagnosis not present

## 2018-09-26 DIAGNOSIS — M329 Systemic lupus erythematosus, unspecified: Secondary | ICD-10-CM | POA: Diagnosis not present

## 2018-09-26 DIAGNOSIS — E785 Hyperlipidemia, unspecified: Secondary | ICD-10-CM | POA: Diagnosis not present

## 2018-09-26 DIAGNOSIS — G4730 Sleep apnea, unspecified: Secondary | ICD-10-CM | POA: Diagnosis not present

## 2018-09-26 DIAGNOSIS — Z9981 Dependence on supplemental oxygen: Secondary | ICD-10-CM | POA: Diagnosis not present

## 2018-09-26 DIAGNOSIS — Z7902 Long term (current) use of antithrombotics/antiplatelets: Secondary | ICD-10-CM | POA: Diagnosis not present

## 2018-09-26 DIAGNOSIS — J84112 Idiopathic pulmonary fibrosis: Secondary | ICD-10-CM | POA: Diagnosis not present

## 2018-09-26 DIAGNOSIS — I25119 Atherosclerotic heart disease of native coronary artery with unspecified angina pectoris: Secondary | ICD-10-CM | POA: Diagnosis not present

## 2018-09-28 DIAGNOSIS — I10 Essential (primary) hypertension: Secondary | ICD-10-CM | POA: Diagnosis not present

## 2018-09-28 DIAGNOSIS — I25119 Atherosclerotic heart disease of native coronary artery with unspecified angina pectoris: Secondary | ICD-10-CM | POA: Diagnosis not present

## 2018-09-28 DIAGNOSIS — G4730 Sleep apnea, unspecified: Secondary | ICD-10-CM | POA: Diagnosis not present

## 2018-09-28 DIAGNOSIS — Z9981 Dependence on supplemental oxygen: Secondary | ICD-10-CM | POA: Diagnosis not present

## 2018-09-28 DIAGNOSIS — J84112 Idiopathic pulmonary fibrosis: Secondary | ICD-10-CM | POA: Diagnosis not present

## 2018-09-28 DIAGNOSIS — M329 Systemic lupus erythematosus, unspecified: Secondary | ICD-10-CM | POA: Diagnosis not present

## 2018-09-28 DIAGNOSIS — Z7902 Long term (current) use of antithrombotics/antiplatelets: Secondary | ICD-10-CM | POA: Diagnosis not present

## 2018-09-28 DIAGNOSIS — E785 Hyperlipidemia, unspecified: Secondary | ICD-10-CM | POA: Diagnosis not present

## 2018-10-04 ENCOUNTER — Telehealth: Payer: Self-pay

## 2018-10-04 NOTE — Telephone Encounter (Signed)
Patient inquiring about a vest with [employer], after reviewing his chart he needs appt with dsk to discuss and order a recent ct scan for vest approval. Vincent Kennedy

## 2018-10-06 DIAGNOSIS — E785 Hyperlipidemia, unspecified: Secondary | ICD-10-CM | POA: Diagnosis not present

## 2018-10-06 DIAGNOSIS — G4730 Sleep apnea, unspecified: Secondary | ICD-10-CM | POA: Diagnosis not present

## 2018-10-06 DIAGNOSIS — M329 Systemic lupus erythematosus, unspecified: Secondary | ICD-10-CM | POA: Diagnosis not present

## 2018-10-06 DIAGNOSIS — Z7902 Long term (current) use of antithrombotics/antiplatelets: Secondary | ICD-10-CM | POA: Diagnosis not present

## 2018-10-06 DIAGNOSIS — I25118 Atherosclerotic heart disease of native coronary artery with other forms of angina pectoris: Secondary | ICD-10-CM | POA: Diagnosis not present

## 2018-10-06 DIAGNOSIS — Z9981 Dependence on supplemental oxygen: Secondary | ICD-10-CM | POA: Diagnosis not present

## 2018-10-06 DIAGNOSIS — I10 Essential (primary) hypertension: Secondary | ICD-10-CM | POA: Diagnosis not present

## 2018-10-06 DIAGNOSIS — J84112 Idiopathic pulmonary fibrosis: Secondary | ICD-10-CM | POA: Diagnosis not present

## 2018-10-06 DIAGNOSIS — I25119 Atherosclerotic heart disease of native coronary artery with unspecified angina pectoris: Secondary | ICD-10-CM | POA: Diagnosis not present

## 2018-10-07 DIAGNOSIS — J849 Interstitial pulmonary disease, unspecified: Secondary | ICD-10-CM | POA: Diagnosis not present

## 2018-10-09 ENCOUNTER — Ambulatory Visit (INDEPENDENT_AMBULATORY_CARE_PROVIDER_SITE_OTHER): Payer: Medicare Other | Admitting: *Deleted

## 2018-10-09 DIAGNOSIS — I442 Atrioventricular block, complete: Secondary | ICD-10-CM

## 2018-10-09 LAB — CUP PACEART REMOTE DEVICE CHECK
Battery Remaining Longevity: 124 mo
Battery Voltage: 3.09 V
Brady Statistic AP VP Percent: 0.03 %
Brady Statistic AP VS Percent: 51.44 %
Brady Statistic AS VP Percent: 0.01 %
Brady Statistic AS VS Percent: 48.52 %
Brady Statistic RA Percent Paced: 51.46 %
Brady Statistic RV Percent Paced: 0.04 %
Date Time Interrogation Session: 20200720064335
Implantable Lead Implant Date: 20200117
Implantable Lead Implant Date: 20200117
Implantable Lead Location: 753859
Implantable Lead Location: 753860
Implantable Lead Model: 3830
Implantable Lead Model: 5076
Implantable Pulse Generator Implant Date: 20200117
Lead Channel Impedance Value: 323 Ohm
Lead Channel Impedance Value: 399 Ohm
Lead Channel Impedance Value: 399 Ohm
Lead Channel Impedance Value: 532 Ohm
Lead Channel Pacing Threshold Amplitude: 0.5 V
Lead Channel Pacing Threshold Amplitude: 1 V
Lead Channel Pacing Threshold Pulse Width: 0.4 ms
Lead Channel Pacing Threshold Pulse Width: 0.4 ms
Lead Channel Sensing Intrinsic Amplitude: 1.625 mV
Lead Channel Sensing Intrinsic Amplitude: 1.625 mV
Lead Channel Sensing Intrinsic Amplitude: 14.875 mV
Lead Channel Sensing Intrinsic Amplitude: 14.875 mV
Lead Channel Setting Pacing Amplitude: 1.5 V
Lead Channel Setting Pacing Amplitude: 2.5 V
Lead Channel Setting Pacing Pulse Width: 1 ms
Lead Channel Setting Sensing Sensitivity: 1.2 mV

## 2018-10-10 ENCOUNTER — Encounter: Payer: Self-pay | Admitting: Internal Medicine

## 2018-10-10 ENCOUNTER — Other Ambulatory Visit: Payer: Self-pay

## 2018-10-10 ENCOUNTER — Ambulatory Visit (INDEPENDENT_AMBULATORY_CARE_PROVIDER_SITE_OTHER): Payer: Medicare Other | Admitting: Internal Medicine

## 2018-10-10 VITALS — BP 120/64 | Resp 16 | Ht 67.0 in | Wt 152.0 lb

## 2018-10-10 DIAGNOSIS — J479 Bronchiectasis, uncomplicated: Secondary | ICD-10-CM | POA: Diagnosis not present

## 2018-10-10 DIAGNOSIS — J9611 Chronic respiratory failure with hypoxia: Secondary | ICD-10-CM | POA: Diagnosis not present

## 2018-10-10 DIAGNOSIS — J84112 Idiopathic pulmonary fibrosis: Secondary | ICD-10-CM | POA: Diagnosis not present

## 2018-10-10 DIAGNOSIS — I25119 Atherosclerotic heart disease of native coronary artery with unspecified angina pectoris: Secondary | ICD-10-CM | POA: Diagnosis not present

## 2018-10-10 DIAGNOSIS — Z7902 Long term (current) use of antithrombotics/antiplatelets: Secondary | ICD-10-CM | POA: Diagnosis not present

## 2018-10-10 DIAGNOSIS — M329 Systemic lupus erythematosus, unspecified: Secondary | ICD-10-CM | POA: Diagnosis not present

## 2018-10-10 DIAGNOSIS — E785 Hyperlipidemia, unspecified: Secondary | ICD-10-CM | POA: Diagnosis not present

## 2018-10-10 DIAGNOSIS — G4730 Sleep apnea, unspecified: Secondary | ICD-10-CM | POA: Diagnosis not present

## 2018-10-10 DIAGNOSIS — Z9981 Dependence on supplemental oxygen: Secondary | ICD-10-CM | POA: Diagnosis not present

## 2018-10-10 DIAGNOSIS — I10 Essential (primary) hypertension: Secondary | ICD-10-CM | POA: Diagnosis not present

## 2018-10-10 NOTE — Progress Notes (Addendum)
[facility] [address] [place], [place] [address]  Internal MEDICINE  Telephone Visit  Patient Name: Vincent CoderMohammad A Brookens  [id number]  [id number]  Date of Service: 10/10/2018  I connected with the patient at 436 by telephone and verified the patients identity using two identifiers.   I discussed the limitations, risks, security and privacy concerns of performing an evaluation and management service by telephone and the availability of in person appointments. I also discussed with the patient that there may be a patient responsible charge related to the service.  The patient expressed understanding and agrees to proceed.    Chief Complaint  Patient presents with  . PULMONARY FIBROSIS    CT SCAN , SMART VEST    HPI  Pt seen via telephone, the patient is interested in a percussion vest.  He needs a CT scan to evaluate his lungs and lungs.  We discussed that a percussion vest would most likely benefit him, and once CT was read it will be ordered.    Current Medication: Outpatient Encounter Medications as of 10/10/2018  Medication Sig  . ACCU-CHEK FASTCLIX LANCETS MISC Use as directed twice day prn diag e11.65  . aspirin EC 81 MG EC tablet Take 1 tablet (81 mg total) by mouth at bedtime.  . budesonide (PULMICORT) 0.5 MG/2ML nebulizer solution USE 1 VIAL VIA NEBULIZER  TWO TIMES DAILY (Patient taking differently: Take 0.5 mg by nebulization 2 (two) times daily. )  . clopidogrel (PLAVIX) 75 MG tablet TAKE 1 TABLET BY MOUTH  DAILY  . finasteride (PROSCAR) 5 MG tablet Take 1 tablet (5 mg total) by mouth daily.  . furosemide (LASIX) 20 MG tablet Take 1 tablet po QOD as directed  . glucose blood (ACCU-CHEK GUIDE) test strip Use as instructed twice a day prn diag e11.65  . ipratropium-albuterol (DUONEB) 0.5-2.5 (3) MG/3ML SOLN Inhale into the lungs.  . metoprolol tartrate (LOPRESSOR) 25 MG tablet TAKE 1 TABLET BY MOUTH TWO  TIMES DAILY (Patient taking differently: Take 12.5 mg by  mouth 2 (two) times daily. )  . mirtazapine (REMERON) 30 MG tablet Take 30 mg by mouth at bedtime.  . mometasone-formoterol (DULERA) 100-5 MCG/ACT AERO Inhale 2 puffs into the lungs 2 (two) times daily.  . montelukast (SINGULAIR) 10 MG tablet Take 1 tablet (10 mg total) by mouth at bedtime.  . nitroGLYCERIN (NITROSTAT) 0.4 MG SL tablet Place 1 tablet (0.4 mg total) under the tongue every 5 (five) minutes as needed for chest pain.  . OXYGEN Inhale 6 L into the lungs continuous.  . pantoprazole (PROTONIX) 40 MG tablet TAKE 1 TABLET BY MOUTH  DAILY AS NEEDED FOR  HEARTBURN  . Pirfenidone (ESBRIET) 267 MG CAPS Take 801 mg by mouth 3 (three) times daily.   . rivastigmine (EXELON) 9.5 mg/24hr Place 1 patch (9.5 mg total) onto the skin daily.  . rosuvastatin (CRESTOR) 40 MG tablet TAKE 1 TABLET BY MOUTH  EVERY DAY  . zaleplon (SONATA) 5 MG capsule Take 1 tablet po QHS prn. May repeat dose one time prn  . zolpidem (AMBIEN) 10 MG tablet Take 1 tablet (10 mg total) by mouth at bedtime as needed.  . [DISCONTINUED] azithromycin (ZITHROMAX) 250 MG tablet Take 1 tab daily for 7 days and then Mon/wed/friday until finished (Patient not taking: Reported on 09/07/2018)   No facility-administered encounter medications on file as of 10/10/2018.     Surgical History: Past Surgical History:  Procedure Laterality Date  . CATARACT EXTRACTION W/PHACO Right 01/29/2015  Procedure: CATARACT EXTRACTION PHACO AND INTRAOCULAR LENS PLACEMENT (IOC);  Surgeon: Leandrew Koyanagi, MD;  Location: [place];  Service: Ophthalmology;  Laterality: Right;  RESTOR LENS CPAP  . CATARACT EXTRACTION W/PHACO Left 03/12/2015   Procedure: CATARACT EXTRACTION PHACO AND INTRAOCULAR LENS PLACEMENT (IOC);  Surgeon: Leandrew Koyanagi, MD;  Location: [place];  Service: Ophthalmology;  Laterality: Left;  RESTOR SHUGARCAINE  . COLONOSCOPY WITH ESOPHAGOGASTRODUODENOSCOPY (EGD)    . CORONARY ANGIOPLASTY WITH STENT PLACEMENT   2010   "1 stent"  . CORONARY ANGIOPLASTY WITH STENT PLACEMENT  01/19/2017  . CORONARY ATHERECTOMY N/A 01/19/2017   Procedure: CORONARY ATHERECTOMY - CSI;  Surgeon: Wellington Hampshire, MD;  Location: [place] [facility];  Service: Cardiovascular;  Laterality: N/A;  unable to cross lesion  . CORONARY STENT INTERVENTION N/A 01/19/2017   Procedure: CORONARY STENT INTERVENTION;  Surgeon: Wellington Hampshire, MD;  Location: [place] [facility];  Service: Cardiovascular;  Laterality: N/A;  . INGUINAL HERNIA REPAIR Bilateral   . LEFT HEART CATH AND CORONARY ANGIOGRAPHY Left 09/12/2017   Procedure: LEFT HEART CATH AND CORONARY ANGIOGRAPHY;  Surgeon: Wellington Hampshire, MD;  Location: [place] [facility];  Service: Cardiovascular;  Laterality: Left;  . PACEMAKER IMPLANT N/A 04/07/2018   Procedure: PACEMAKER IMPLANT;  Surgeon: Evans Lance, MD;  Location: [place] [facility];  Service: Cardiovascular;  Laterality: N/A;  . RIGHT/LEFT HEART CATH AND CORONARY ANGIOGRAPHY N/A 01/12/2017   Procedure: RIGHT/LEFT HEART CATH AND CORONARY ANGIOGRAPHY;  Surgeon: Wellington Hampshire, MD;  Location: [place] [facility];  Service: Cardiovascular;  Laterality: N/A;    Medical History: Past Medical History:  Diagnosis Date  . BPH (benign prostatic hyperplasia)   . Coronary artery disease    a. 11/2006 Cath: D1 99ost (2.5x15 Xience DES), D2 90ost (small); b. 12/2017 PCI: LAD 95 (3.25x8 & 3.0x [place]'. D1 patent stent. D2 30ost. EF 55-65%;  c. 08/2017 Cath: LM min irregs, LAD 40ost, patent stents, D1 patent stent, LCX nl, OM1/2/3 nl, RCA 30ost, RPDA/RPAV/RPL1/2/3 nl.  . DOE (dyspnea on exertion)    a. Chronic supplemental O2 in setting of pulm fibrosis; b. 10/2017 Echo: Nl LV size/fxn. Mild Ao sclerosis. Trace to mild TR.  [name] GERD (gastroesophageal reflux disease)   . Hyperlipidemia   . Hypertension   . On home oxygen therapy    "2-3L; 24/7" (01/19/2017); "6L 24/7" (04/06/2018)  . OSA on CPAP   . Oxygen deficiency   .  Pacemaker 04/07/2018  . Pneumonia 07/2016  . Pulmonary fibrosis ([place]) 08/26/2015  . Wears hearing aid    (sometimes)    Family History: Family History  Problem Relation Age of Onset  . CAD Father   . CAD Brother     Social History   Socioeconomic History  . Marital status: Married    Spouse name: Not on file  . Number of children: Not on file  . Years of education: Not on file  . Highest education level: Not on file  Occupational History  . Occupation: retired  Scientific laboratory technician  . Financial resource strain: Not on file  . Food insecurity    Worry: Not on file    Inability: Not on file  . Transportation needs    Medical: Not on file    Non-medical: Not on file  Tobacco Use  . Smoking status: Never Smoker  . Smokeless tobacco: Never Used  Substance and Sexual Activity  . Alcohol use: No    Alcohol/week: 0.0  standard drinks  . Drug use: No  . Sexual activity: Yes    Comment: patient states minor  Lifestyle  . Physical activity    Days per week: Not on file    Minutes per session: Not on file  . Stress: Not on file  Relationships  . Social Musicianconnections    Talks on phone: Not on file    Gets together: Not on file    Attends religious service: Not on file    Active member of club or organization: Not on file    Attends meetings of clubs or organizations: Not on file    Relationship status: Not on file  . Intimate partner violence    Fear of current or ex partner: Not on file    Emotionally abused: Not on file    Physically abused: Not on file    Forced sexual activity: Not on file  Other Topics Concern  . 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      Review of Systems  Constitutional: Negative.  Negative for chills, fatigue and unexpected weight change.  HENT: Negative.  Negative for congestion, rhinorrhea, sneezing and sore throat.   Eyes: Negative for redness.  Respiratory: Negative.  Negative for  cough, chest tightness and shortness of breath.   Cardiovascular: Negative.  Negative for chest pain and palpitations.  Gastrointestinal: Negative.  Negative for abdominal pain, constipation, diarrhea, nausea and vomiting.  Endocrine: Negative.   Genitourinary: Negative.  Negative for dysuria and frequency.  Musculoskeletal: Negative.  Negative for arthralgias, back pain, joint swelling and neck pain.  Skin: Negative.  Negative for rash.  Allergic/Immunologic: Negative.   Neurological: Negative.  Negative for tremors and numbness.  Hematological: Negative for adenopathy. Does not bruise/bleed easily.  Psychiatric/Behavioral: Negative.  Negative for behavioral problems, sleep disturbance and suicidal ideas. The patient is not nervous/anxious.     Vital Signs: BP 120/64   Resp 16   Ht 5\' 7"  (1.702 m)   Wt 152 lb (68.9 kg)   BMI 23.81 kg/m    Observation/Objective:  Doing well at this time. NAD noted. Oxygen 8, 10 when walking  Assessment/Plan: 1. IPF (idiopathic pulmonary fibrosis) (HCC) Stable at this time, with no pneumonia present.   2. Bronchiectasis without complication (HCC) Bronchiectasis present on CT. patient has bronchiectasis and has had a CT scan confirming the disease on 10/23/2018.  Patient has tried active cycle of breathing treatment and have coughing that along with not able to adequately mobilize retained secretions and reduce recurring infections.  There is now a caregiver to provide effective CPT twice a day 7 days a week and 65 days a year.  Patient has had a daily productive cough for more than 6 months.  Patient would benefit from high-frequency chest wall oscillation vest therapy.  Vest therapy was ordered at this time.  3. Chronic respiratory failure with hypoxia (HCC) Continue present management.  Continue to use oxygen as discussed.   General Counseling: Akhil verbalizes understanding of the findings of today's phone visit and agrees with plan of  treatment. I have discussed any further diagnostic evaluation that may be needed or ordered today. We also reviewed his medications today. he has been encouraged to call the office with any questions or concerns that should arise related to todays visit.    No orders of the defined types were placed in this encounter.   No orders of the defined types were placed  in this encounter.   Time spent: 12 Minutes   Blima LedgerAdam Jawaun Celmer [facility] BerlinGNP-C Internal medicine

## 2018-10-12 ENCOUNTER — Other Ambulatory Visit: Payer: Self-pay | Admitting: Internal Medicine

## 2018-10-12 DIAGNOSIS — J479 Bronchiectasis, uncomplicated: Secondary | ICD-10-CM

## 2018-10-12 DIAGNOSIS — R0602 Shortness of breath: Secondary | ICD-10-CM

## 2018-10-12 DIAGNOSIS — R600 Localized edema: Secondary | ICD-10-CM

## 2018-10-13 ENCOUNTER — Other Ambulatory Visit: Payer: Self-pay

## 2018-10-13 ENCOUNTER — Other Ambulatory Visit (INDEPENDENT_AMBULATORY_CARE_PROVIDER_SITE_OTHER): Payer: Medicare Other

## 2018-10-13 DIAGNOSIS — R600 Localized edema: Secondary | ICD-10-CM

## 2018-10-17 DIAGNOSIS — E785 Hyperlipidemia, unspecified: Secondary | ICD-10-CM | POA: Diagnosis not present

## 2018-10-17 DIAGNOSIS — I10 Essential (primary) hypertension: Secondary | ICD-10-CM | POA: Diagnosis not present

## 2018-10-17 DIAGNOSIS — Z9981 Dependence on supplemental oxygen: Secondary | ICD-10-CM | POA: Diagnosis not present

## 2018-10-17 DIAGNOSIS — I25119 Atherosclerotic heart disease of native coronary artery with unspecified angina pectoris: Secondary | ICD-10-CM | POA: Diagnosis not present

## 2018-10-17 DIAGNOSIS — Z7902 Long term (current) use of antithrombotics/antiplatelets: Secondary | ICD-10-CM | POA: Diagnosis not present

## 2018-10-17 DIAGNOSIS — M329 Systemic lupus erythematosus, unspecified: Secondary | ICD-10-CM | POA: Diagnosis not present

## 2018-10-17 DIAGNOSIS — G4730 Sleep apnea, unspecified: Secondary | ICD-10-CM | POA: Diagnosis not present

## 2018-10-17 DIAGNOSIS — J84112 Idiopathic pulmonary fibrosis: Secondary | ICD-10-CM | POA: Diagnosis not present

## 2018-10-18 ENCOUNTER — Ambulatory Visit: Payer: Medicare Other

## 2018-10-23 ENCOUNTER — Ambulatory Visit
Admission: RE | Admit: 2018-10-23 | Discharge: 2018-10-23 | Disposition: A | Discharge status: Home / Self Care | Payer: Medicare Other | Source: Ambulatory Visit | Attending: Internal Medicine | Admitting: Internal Medicine

## 2018-10-23 ENCOUNTER — Encounter: Payer: Self-pay | Admitting: Cardiology

## 2018-10-23 ENCOUNTER — Other Ambulatory Visit: Payer: Self-pay

## 2018-10-23 DIAGNOSIS — J479 Bronchiectasis, uncomplicated: Secondary | ICD-10-CM

## 2018-10-23 DIAGNOSIS — R0602 Shortness of breath: Secondary | ICD-10-CM | POA: Insufficient documentation

## 2018-10-23 NOTE — Progress Notes (Signed)
Remote pacemaker transmission.   

## 2018-10-30 ENCOUNTER — Other Ambulatory Visit: Payer: Self-pay

## 2018-10-30 DIAGNOSIS — Z20822 Contact with and (suspected) exposure to covid-19: Secondary | ICD-10-CM

## 2018-10-31 LAB — NOVEL CORONAVIRUS, NAA: SARS-CoV-2, NAA: NOT DETECTED

## 2018-11-02 ENCOUNTER — Telehealth: Payer: Self-pay | Admitting: Cardiovascular Disease

## 2018-11-02 NOTE — Telephone Encounter (Signed)
Please advise if ok to refill medications. Pt seems to be going to [facility] as well.

## 2018-11-02 NOTE — Telephone Encounter (Signed)
Patient was seen by Dr. Alycia Rossetti on 10/06/2018 at [facility]. The patient was instructed at that time to finish his current supply of plavix, then stop this. We will not refill this RX.  To Dr. Fletcher Anon to review for protonix refill as I am not sure if the patient will continue to follow here or not.

## 2018-11-03 ENCOUNTER — Other Ambulatory Visit: Payer: Self-pay

## 2018-11-03 ENCOUNTER — Ambulatory Visit (INDEPENDENT_AMBULATORY_CARE_PROVIDER_SITE_OTHER): Payer: Medicare Other | Admitting: Physician Assistant

## 2018-11-03 DIAGNOSIS — R350 Frequency of micturition: Secondary | ICD-10-CM

## 2018-11-03 DIAGNOSIS — R3915 Urgency of urination: Secondary | ICD-10-CM | POA: Diagnosis not present

## 2018-11-03 DIAGNOSIS — R339 Retention of urine, unspecified: Secondary | ICD-10-CM | POA: Diagnosis not present

## 2018-11-03 DIAGNOSIS — R3129 Other microscopic hematuria: Secondary | ICD-10-CM

## 2018-11-03 DIAGNOSIS — Z87898 Personal history of other specified conditions: Secondary | ICD-10-CM

## 2018-11-03 LAB — URINALYSIS, COMPLETE
Bilirubin, UA: NEGATIVE
Glucose, UA: NEGATIVE
Ketones, UA: NEGATIVE
Leukocytes,UA: NEGATIVE
Nitrite, UA: NEGATIVE
Protein,UA: NEGATIVE
Specific Gravity, UA: 1.01 (ref 1.005–1.030)
Urobilinogen, Ur: 0.2 mg/dL (ref 0.2–1.0)
pH, UA: 6 (ref 5.0–7.5)

## 2018-11-03 LAB — MICROSCOPIC EXAMINATION
Bacteria, UA: NONE SEEN
Epithelial Cells (non renal): NONE SEEN /hpf (ref 0–10)

## 2018-11-03 LAB — BLADDER SCAN AMB NON-IMAGING

## 2018-11-03 NOTE — Progress Notes (Signed)
11/03/2018 4:39 PM   Vincent Kennedy 1932/04/17 [id number]  CC: Urinary retention  HPI: Vincent Kennedy is a 85 y.o. male who presents today for evaluation of possible urinary retention. He was last seen by Dr. Apolinar JunesBrandon as an inpatient consult on 08/18/2016 when he was hospitalized with pneumonia and subsequently found to have urinary retention.  He was previously managed by [facility] Achilles [facility] with a PMH of BPH with elevated PSA.  He followed up in our office on 08/23/2016.  He failed fill and pull voiding trial and underwent CIC teaching that day.  We have not seen him since.  Today, he reports a 1 day history of urinary urgency and frequency.  He denies gross hematuria, nausea, vomiting, fever, chills, and flank pain.  He states he was concerned that he was not emptying his bladder, however he has been able to urinate today in clinic and no longer has a sensation of a full bladder.  He states that he did not continue with CIC following teaching in 2018 and has not seen a urologist since. He does not have a history of recurrent UTI.  He is a never smoker.  He was treated with azithromycin for purulent sputum in the setting of his pulmonary fibrosis in the last 30 days.  In-office UA today positive for 1+ blood; urine microscopy with 3-10 RBCs/HPF. PVR 182mL.  PMH: Past Medical History:  Diagnosis Date  . BPH (benign prostatic hyperplasia)   . Coronary artery disease    a. 11/2006 Cath: D1 99ost (2.5x15 Xience DES), D2 90ost (small); b. 12/2017 PCI: LAD 95 (3.25x8 & 3.0x 33 [place] DES'. D1 patent stent. D2 30ost. EF 55-65%;  c. 08/2017 Cath: LM min irregs, LAD 40ost, patent stents, D1 patent stent, LCX nl, OM1/2/3 nl, RCA 30ost, RPDA/RPAV/RPL1/2/3 nl.  . DOE (dyspnea on exertion)    a. Chronic supplemental O2 in setting of pulm fibrosis; b. 10/2017 Echo: Nl LV size/fxn. Mild Ao sclerosis. Trace to mild TR.  [name]. GERD (gastroesophageal reflux disease)   . Hyperlipidemia   . Hypertension   .  On home oxygen therapy    "2-3L; 24/7" (01/19/2017); "6L 24/7" (04/06/2018)  . OSA on CPAP   . Oxygen deficiency   . Pacemaker 04/07/2018  . Pneumonia 07/2016  . Pulmonary fibrosis (HCC) 08/26/2015  . Wears hearing aid    (sometimes)    Surgical History: Past Surgical History:  Procedure Laterality Date  . CATARACT EXTRACTION W/PHACO Right 01/29/2015   Procedure: CATARACT EXTRACTION PHACO AND INTRAOCULAR LENS PLACEMENT (IOC);  Surgeon: [name], MD;  [facility];  Service: Ophthalmology;  Laterality: Right;  RESTOR LENS CPAP  . CATARACT EXTRACTION W/PHACO Left 03/12/2015   Procedure: CATARACT EXTRACTION PHACO AND INTRAOCULAR LENS PLACEMENT (IOC);  Surgeon: [name], MD;  [facility];  Service: Ophthalmology;  Laterality: Left;  RESTOR SHUGARCAINE  . COLONOSCOPY WITH ESOPHAGOGASTRODUODENOSCOPY (EGD)    . CORONARY ANGIOPLASTY WITH STENT PLACEMENT  2010   "1 stent"  . CORONARY ANGIOPLASTY WITH STENT PLACEMENT  01/19/2017  . CORONARY ATHERECTOMY N/A 01/19/2017   Procedure: CORONARY ATHERECTOMY - CSI;  Surgeon: [name], Muhammad A, MD;  Location: [facility];  Service: Cardiovascular;  Laterality: N/A;  unable to cross lesion  . CORONARY STENT INTERVENTION N/A 01/19/2017   Procedure: CORONARY STENT INTERVENTION;  Surgeon: [name], Muhammad A, MD;  Location: [facility];  Service: Cardiovascular;  Laterality: N/A;  . INGUINAL HERNIA REPAIR Bilateral   .  LEFT HEART CATH AND CORONARY ANGIOGRAPHY Left 09/12/2017   Procedure: LEFT HEART CATH AND CORONARY ANGIOGRAPHY;  Surgeon: [name], Muhammad A, MD;  Location: [facility] INVASIVE CV LAB;  Service: Cardiovascular;  Laterality: Left;  . PACEMAKER IMPLANT N/A 04/07/2018   Procedure: PACEMAKER IMPLANT;  Surgeon: Marinus Mawaylor, Gregg W, MD;  [facility];  Service: Cardiovascular;  Laterality: N/A;  . RIGHT/LEFT HEART CATH AND CORONARY ANGIOGRAPHY N/A 01/12/2017   Procedure:  RIGHT/LEFT HEART CATH AND CORONARY ANGIOGRAPHY;  Surgeon: [name], Muhammad A, MD;  Location: [facility];  Service: Cardiovascular;  Laterality: N/A;    Home Medications:  Allergies as of 11/03/2018   No Known Allergies     Medication List       Accurate as of November 03, 2018  4:39 PM. If you have any questions, ask your nurse or doctor.        Accu-Chek FastClix Lancets Misc Use as directed twice day prn diag e11.65   aspirin 81 MG EC tablet Take 1 tablet (81 mg total) by mouth at bedtime.   budesonide 0.5 MG/2ML nebulizer solution Commonly known as: PULMICORT USE 1 VIAL VIA NEBULIZER  TWO TIMES DAILY What changed: See the new instructions.   Esbriet 267 MG Caps Generic drug: Pirfenidone Take 801 mg by mouth 3 (three) times daily.   finasteride 5 MG tablet Commonly known as: PROSCAR Take 1 tablet (5 mg total) by mouth daily.   furosemide 20 MG tablet Commonly known as: LASIX Take 1 tablet po QOD as directed   glucose blood test strip Commonly known as: Accu-Chek Guide Use as instructed twice a day prn diag e11.65   ipratropium-albuterol 0.5-2.5 (3) MG/3ML Soln Commonly known as: DUONEB Inhale into the lungs.   metoprolol tartrate 25 MG tablet Commonly known as: LOPRESSOR TAKE 1 TABLET BY MOUTH TWO  TIMES DAILY What changed: how much to take   mirtazapine 30 MG tablet Commonly known as: REMERON Take 30 mg by mouth at bedtime.   mometasone-formoterol 100-5 MCG/ACT Aero Commonly known as: DULERA Inhale 2 puffs into the lungs 2 (two) times daily.   montelukast 10 MG tablet Commonly known as: SINGULAIR Take 1 tablet (10 mg total) by mouth at bedtime.   nitroGLYCERIN 0.4 MG SL tablet Commonly known as: Nitrostat Place 1 tablet (0.4 mg total) under the tongue every 5 (five) minutes as needed for chest pain.   OXYGEN Inhale 6 L into the lungs continuous.   pantoprazole 40 MG tablet Commonly known as: PROTONIX TAKE 1 TABLET BY MOUTH  DAILY AS  NEEDED FOR  HEARTBURN   rivastigmine 9.5 mg/24hr Commonly known as: EXELON Place 1 patch (9.5 mg total) onto the skin daily.   rosuvastatin 40 MG tablet Commonly known as: CRESTOR TAKE 1 TABLET BY MOUTH  EVERY DAY   zaleplon 5 MG capsule Commonly known as: Sonata Take 1 tablet po QHS prn. May repeat dose one time prn   zolpidem 10 MG tablet Commonly known as: AMBIEN Take 1 tablet (10 mg total) by mouth at bedtime as needed.       Allergies:  No Known Allergies  Family History: Family History  Problem Relation Age of Onset  . CAD Father   . CAD Brother     Social History:   reports that he has never smoked. He has never used smokeless tobacco. He reports that he does not drink alcohol or use drugs.  ROS: UROLOGY Frequent Urination?: Yes Hard to postpone urination?: Yes Burning/pain with  urination?: No Get up at night to urinate?: Yes Leakage of urine?: No Urine stream starts and stops?: No Trouble starting stream?: Yes Do you have to strain to urinate?: Yes Blood in urine?: No Urinary tract infection?: No Sexually transmitted disease?: No Injury to kidneys or bladder?: No Painful intercourse?: No Weak stream?: Yes Erection problems?: Yes Penile pain?: No  Gastrointestinal Nausea?: No Vomiting?: No Indigestion/heartburn?: No Diarrhea?: No Constipation?: No  Constitutional Fever: No Night sweats?: No Weight loss?: No Fatigue?: No  Skin Skin rash/lesions?: No Itching?: No  Eyes Blurred vision?: No Double vision?: No  Ears/Nose/Throat Sore throat?: No Sinus problems?: Yes  Hematologic/Lymphatic Swollen glands?: No Easy bruising?: No  Cardiovascular Leg swelling?: No Chest pain?: No  Respiratory Cough?: Yes Shortness of breath?: Yes  Endocrine Excessive thirst?: No  Musculoskeletal Back pain?: No Joint pain?: No  Neurological Headaches?: No Dizziness?: No  Psychologic Depression?: No Anxiety?: No  Physical Exam: There  were no vitals taken for this visit.  Constitutional:  Alert and oriented, no acute distress, nontoxic appearing HEENT: [place], AT Cardiovascular: No clubbing, cyanosis, or edema Respiratory: Increased work of breathing, nasal cannula in place with oxygen canister present. Skin: No rashes, bruises or suspicious lesions Neurologic: Grossly intact, no focal deficits, moving all 4 extremities Psychiatric: Normal mood and affect  Laboratory Data: Results for orders placed or performed in visit on 11/03/18  Microscopic Examination   URINE  Result Value Ref Range   WBC, UA 0-5 0 - 5 /hpf   RBC 3-10 (A) 0 - 2 /hpf   Epithelial Cells (non renal) None seen 0 - 10 /hpf   Bacteria, UA None seen None seen/Few  Urinalysis, Complete  Result Value Ref Range   Specific Gravity, UA 1.010 1.005 - 1.030   pH, UA 6.0 5.0 - 7.5   Color, UA Yellow Yellow   Appearance Ur Clear Clear   Leukocytes,UA Negative Negative   Protein,UA Negative Negative/Trace   Glucose, UA Negative Negative   Ketones, UA Negative Negative   RBC, UA 1+ (A) Negative   Bilirubin, UA Negative Negative   Urobilinogen, Ur 0.2 0.2 - 1.0 mg/dL   Nitrite, UA Negative Negative   Microscopic Examination See below:   BLADDER SCAN AMB NON-IMAGING  Result Value Ref Range   Scan Result 185ml    Assessment & Plan:   1. Urinary urgency and frequency Patient presenting with concerns for urinary retention, however PVR is within normal limits, particularly given his known history of BPH with history of urinary retention of greater than 800 mL.  No catheterization or CIC reteaching indicated at this time.  I counseled the patient and his wife that if he develops worsened abdominal pain, the inability to urinate, fevers, chills, nausea, vomiting, or flank pain, he should proceed to the emergency department.  UA unconcerning for infection at this time, however with microscopic hematuria, see below.  We will proceed with urine culture today.  If  his urine culture is positive, I will treat him for infection and have him return to the clinic in 1 month for repeat UA to prove resolution of hematuria. -Urinalysis, Complete -Urine culture -PVR  2.  Microscopic hematuria Patient with microscopic hematuria on UA today.  Given his age, he falls within the high risk group per recent AUA guidelines for microscopic hematuria evaluation.  I have ordered cystoscopy and CT urogram today.  I counseled the patient and his wife that there are many possible causes for microscopic hematuria, including nephrolithiasis, infection,  and benign or malignant lesions of the urinary tract.  I explained that it is very important for us to evaluate him for possible causes of this hematuria today.  They expressed an understanding of this plan. -CT hematuria  Carman ChingSamantha Khang Hannum, [facility] - BA-C  [facility] [address], [address] [place], [place] [address] [phone]

## 2018-11-03 NOTE — Telephone Encounter (Signed)
Okay to stop Plavix and refill Protonix.

## 2018-11-06 LAB — CULTURE, URINE COMPREHENSIVE

## 2018-11-07 ENCOUNTER — Other Ambulatory Visit: Payer: Self-pay

## 2018-11-07 ENCOUNTER — Encounter: Payer: Self-pay | Admitting: Physician Assistant

## 2018-11-07 ENCOUNTER — Telehealth: Payer: Self-pay | Admitting: Physician Assistant

## 2018-11-07 ENCOUNTER — Ambulatory Visit: Payer: Medicare Other | Admitting: Physician Assistant

## 2018-11-07 VITALS — BP 125/90 | HR 76 | Ht 67.0 in

## 2018-11-07 DIAGNOSIS — Z87898 Personal history of other specified conditions: Secondary | ICD-10-CM | POA: Diagnosis not present

## 2018-11-07 DIAGNOSIS — J849 Interstitial pulmonary disease, unspecified: Secondary | ICD-10-CM | POA: Diagnosis not present

## 2018-11-07 DIAGNOSIS — R3129 Other microscopic hematuria: Secondary | ICD-10-CM | POA: Diagnosis not present

## 2018-11-07 LAB — URINALYSIS, COMPLETE
Bilirubin, UA: NEGATIVE
Glucose, UA: NEGATIVE
Ketones, UA: NEGATIVE
Nitrite, UA: NEGATIVE
Protein,UA: NEGATIVE
Specific Gravity, UA: 1.01 (ref 1.005–1.030)
Urobilinogen, Ur: 0.2 mg/dL (ref 0.2–1.0)
pH, UA: 6.5 (ref 5.0–7.5)

## 2018-11-07 LAB — MICROSCOPIC EXAMINATION: Bacteria, UA: NONE SEEN

## 2018-11-07 LAB — BLADDER SCAN AMB NON-IMAGING: Scan Result: 356

## 2018-11-07 MED ORDER — TAMSULOSIN HCL 0.4 MG PO CAPS: 0.4000 mg | ORAL_CAPSULE | Freq: Every day | ORAL | 0 refills | Status: DC

## 2018-11-07 NOTE — Progress Notes (Signed)
11/07/2018 5:14 PM   MERLIN GOLDEN July 14, 1932 [id number]  CC: Inability to urinate  HPI: Vincent Kennedy is a 85 y.o. male who presents today for evaluation of possible urinary retention. He is an established [facility] patient who last saw me on 11/03/2018 for the same complaint.  PVR within normal limits at that visit, urine culture negative.  UA with gross hematuria, hematuria work-up initiated at that visit.  He has a cystoscopy with Dr. Erlene Quan scheduled for 11/28/2018.  CT urogram yet to be scheduled.  He reports continued difficulty urinating that worsened over the weekend.  He states he is straining to initiate urine stream and has had 2 episodes of overflow incontinence.  He took 2 doses of finasteride 5 mg with no symptom improvement.  He states he has taken tamsulosin in the past, however he is no longer taking it.  He does not know why.  In-office UA today positive for trace intact blood and trace leukocyte esterase; urine microscopy pan-negative. PVR 312mL.  PMH: Past Medical History:  Diagnosis Date  . BPH (benign prostatic hyperplasia)   . Coronary artery disease    a. 11/2006 Cath: D1 99ost (2.5x15 Xience DES), D2 90ost (small); b. 12/2017 PCI: LAD 95 (3.25x8 & 3.0x [place]'. D1 patent stent. D2 30ost. EF 55-65%;  c. 08/2017 Cath: LM min irregs, LAD 40ost, patent stents, D1 patent stent, LCX nl, OM1/2/3 nl, RCA 30ost, RPDA/RPAV/RPL1/2/3 nl.  . DOE (dyspnea on exertion)    a. Chronic supplemental O2 in setting of pulm fibrosis; b. 10/2017 Echo: Nl LV size/fxn. Mild Ao sclerosis. Trace to mild TR.  [name] GERD (gastroesophageal reflux disease)   . Hyperlipidemia   . Hypertension   . On home oxygen therapy    "2-3L; 24/7" (01/19/2017); "6L 24/7" (04/06/2018)  . OSA on CPAP   . Oxygen deficiency   . Pacemaker 04/07/2018  . Pneumonia 07/2016  . Pulmonary fibrosis ([place]) 08/26/2015  . Wears hearing aid    (sometimes)    Surgical History: Past Surgical History:  Procedure  Laterality Date  . CATARACT EXTRACTION W/PHACO Right 01/29/2015   Procedure: CATARACT EXTRACTION PHACO AND INTRAOCULAR LENS PLACEMENT (IOC);  Surgeon: Leandrew Koyanagi, MD;  Location: [facility];  Service: Ophthalmology;  Laterality: Right;  RESTOR LENS CPAP  . CATARACT EXTRACTION W/PHACO Left 03/12/2015   Procedure: CATARACT EXTRACTION PHACO AND INTRAOCULAR LENS PLACEMENT (IOC);  Surgeon: Leandrew Koyanagi, MD;  Location: [facility];  Service: Ophthalmology;  Laterality: Left;  RESTOR SHUGARCAINE  . COLONOSCOPY WITH ESOPHAGOGASTRODUODENOSCOPY (EGD)    . CORONARY ANGIOPLASTY WITH STENT PLACEMENT  2010   "1 stent"  . CORONARY ANGIOPLASTY WITH STENT PLACEMENT  01/19/2017  . CORONARY ATHERECTOMY N/A 01/19/2017   Procedure: CORONARY ATHERECTOMY - CSI;  Surgeon: Wellington Hampshire, MD;  Location: [place] CV LAB;  Service: Cardiovascular;  Laterality: N/A;  unable to cross lesion  . CORONARY STENT INTERVENTION N/A 01/19/2017   Procedure: CORONARY STENT INTERVENTION;  Surgeon: Wellington Hampshire, MD;  Location: [place] [facility];  Service: Cardiovascular;  Laterality: N/A;  . INGUINAL HERNIA REPAIR Bilateral   . LEFT HEART CATH AND CORONARY ANGIOGRAPHY Left 09/12/2017   Procedure: LEFT HEART CATH AND CORONARY ANGIOGRAPHY;  Surgeon: Wellington Hampshire, MD;  Location: [place] [facility];  Service: Cardiovascular;  Laterality: Left;  . PACEMAKER IMPLANT N/A 04/07/2018   Procedure: PACEMAKER IMPLANT;  Surgeon: Evans Lance, MD;  Location: [place] [facility];  Service: Cardiovascular;  Laterality:  N/A;  . RIGHT/LEFT HEART CATH AND CORONARY ANGIOGRAPHY N/A 01/12/2017   Procedure: RIGHT/LEFT HEART CATH AND CORONARY ANGIOGRAPHY;  Surgeon: [name], Muhammad A, MD;  Location: [facility];  Service: Cardiovascular;  Laterality: N/A;    Home Medications:  Allergies as of 11/07/2018   No Known Allergies     Medication List       Accurate as of November 07, 2018  5:14 PM. If  you have any questions, ask your nurse or doctor.        Accu-Chek FastClix Lancets Misc Use as directed twice day prn diag e11.65   aspirin 81 MG EC tablet Take 1 tablet (81 mg total) by mouth at bedtime.   budesonide 0.5 MG/2ML nebulizer solution Commonly known as: PULMICORT USE 1 VIAL VIA NEBULIZER  TWO TIMES DAILY What changed: See the new instructions.   Esbriet 267 MG Caps Generic drug: Pirfenidone Take 801 mg by mouth 3 (three) times daily.   finasteride 5 MG tablet Commonly known as: PROSCAR Take 1 tablet (5 mg total) by mouth daily.   furosemide 20 MG tablet Commonly known as: LASIX Take 1 tablet po QOD as directed   glucose blood test strip Commonly known as: Accu-Chek Guide Use as instructed twice a day prn diag e11.65   ipratropium-albuterol 0.5-2.5 (3) MG/3ML Soln Commonly known as: DUONEB Inhale into the lungs.   metoprolol tartrate 25 MG tablet Commonly known as: LOPRESSOR TAKE 1 TABLET BY MOUTH TWO  TIMES DAILY What changed: how much to take   mirtazapine 30 MG tablet Commonly known as: REMERON Take 30 mg by mouth at bedtime.   mometasone-formoterol 100-5 MCG/ACT Aero Commonly known as: DULERA Inhale 2 puffs into the lungs 2 (two) times daily.   montelukast 10 MG tablet Commonly known as: SINGULAIR Take 1 tablet (10 mg total) by mouth at bedtime.   nitroGLYCERIN 0.4 MG SL tablet Commonly known as: Nitrostat Place 1 tablet (0.4 mg total) under the tongue every 5 (five) minutes as needed for chest pain.   OXYGEN Inhale 6 L into the lungs continuous.   pantoprazole 40 MG tablet Commonly known as: PROTONIX TAKE 1 TABLET BY MOUTH  DAILY AS NEEDED FOR  HEARTBURN   rivastigmine 9.5 mg/24hr Commonly known as: EXELON Place 1 patch (9.5 mg total) onto the skin daily.   rosuvastatin 40 MG tablet Commonly known as: CRESTOR TAKE 1 TABLET BY MOUTH  EVERY DAY   tamsulosin 0.4 MG Caps capsule Commonly known as: FLOMAX Take 1 capsule (0.4 mg  total) by mouth daily. Started by: Carman ChingSamantha Terrel Manalo, PA-C   zaleplon 5 MG capsule Commonly known as: Sonata Take 1 tablet po QHS prn. May repeat dose one time prn   zolpidem 10 MG tablet Commonly known as: AMBIEN Take 1 tablet (10 mg total) by mouth at bedtime as needed.       Allergies:  No Known Allergies  Family History: Family History  Problem Relation Age of Onset  . CAD Father   . CAD Brother     Social History:   reports that he has never smoked. He has never used smokeless tobacco. He reports that he does not drink alcohol or use drugs.  ROS: UROLOGY Frequent Urination?: Yes Hard to postpone urination?: Yes Burning/pain with urination?: Yes Get up at night to urinate?: Yes Leakage of urine?: Yes Urine stream starts and stops?: No Trouble starting stream?: Yes Do you have to strain to urinate?: Yes Blood in urine?: No Urinary tract infection?:  No Sexually transmitted disease?: No Injury to kidneys or bladder?: No Painful intercourse?: No Weak stream?: No Erection problems?: Yes Penile pain?: No  Gastrointestinal Nausea?: No Vomiting?: No Indigestion/heartburn?: No Diarrhea?: No Constipation?: No  Constitutional Fever: No Night sweats?: No Weight loss?: No Fatigue?: No  Skin Skin rash/lesions?: No Itching?: No  Eyes Blurred vision?: No Double vision?: No  Ears/Nose/Throat Sore throat?: No Sinus problems?: No  Hematologic/Lymphatic Swollen glands?: No Easy bruising?: No  Cardiovascular Leg swelling?: No Chest pain?: No  Respiratory Cough?: No Shortness of breath?: Yes  Endocrine Excessive thirst?: No  Musculoskeletal Back pain?: No Joint pain?: No  Neurological Headaches?: No Dizziness?: No  Psychologic Depression?: No Anxiety?: No  Physical Exam: BP 125/90 (BP Location: Left Arm, Patient Position: Sitting, Cuff Size: Normal)   Pulse 76   Ht 5\' 7"  (1.702 m)   BMI 23.81 kg/m   Constitutional:  Alert and  oriented, nontoxic appearing HEENT: [place], AT Cardiovascular: No clubbing, cyanosis, or edema Respiratory: Increased work of breathing, nasal cannula in place with oxygen tank in office Skin: No rashes, bruises or suspicious lesions Neurologic: Grossly intact, no focal deficits, moving all 4 extremities Psychiatric: Normal mood and affect  Laboratory Data: Results for orders placed or performed in visit on 11/07/18  Microscopic Examination   URINE  Result Value Ref Range   WBC, UA 0-5 0 - 5 /hpf   RBC 0-2 0 - 2 /hpf   Epithelial Cells (non renal) 0-10 0 - 10 /hpf   Bacteria, UA None seen None seen/Few  Urinalysis, Complete  Result Value Ref Range   Specific Gravity, UA 1.010 1.005 - 1.030   pH, UA 6.5 5.0 - 7.5   Color, UA Yellow Yellow   Appearance Ur Clear Clear   Leukocytes,UA Trace (A) Negative   Protein,UA Negative Negative/Trace   Glucose, UA Negative Negative   Ketones, UA Negative Negative   RBC, UA Trace (A) Negative   Bilirubin, UA Negative Negative   Urobilinogen, Ur 0.2 0.2 - 1.0 mg/dL   Nitrite, UA Negative Negative   Microscopic Examination See below:   Bladder Scan (Post Void Residual) in office  Result Value Ref Range   Scan Result 356    Assessment & Plan:   1. History of urinary retention Patient with continued complaints of urinary retention, however this time with elevated PVR.  He does have a known history of BPH which may be contributing.  Cystoscopy scheduled already.  Negative urine culture last week, will not repeat today.  I counseled the patient and his wife that finasteride may take several weeks to months to see an effect and thus is not a good medication for acute symptoms.  I cannot see anything in his medical record to explain why he was taken off tamsulosin in the past.  I will represcribe this today.  We can revisit its utility based on the results of his cystoscopy next month.  I provided CIC samples and counseled the couple on  self-catheterization.  I counseled them to catheter twice daily as needed.  Given his waxing and waning urinary retention, I do not believe that the benefits of an indwelling Foley would outweigh the risks of UTI development.  - Urinalysis, Complete - Bladder Scan (Post Void Residual) in office - Tamsulosin 0.4mg  daily  Carman ChingSamantha Yulianna Folse, [facility]  [facility] [address], [address] [place], [place] [address] [phone]

## 2018-11-07 NOTE — Telephone Encounter (Addendum)
Pt called and states that he is having difficulty urinating. He is scheduled for a Cysto on 11/28/2018 with Dr Erlene Quan. Please advise.

## 2018-11-07 NOTE — Telephone Encounter (Signed)
Pt seen in clinic 11/07/2018

## 2018-11-14 DIAGNOSIS — J479 Bronchiectasis, uncomplicated: Secondary | ICD-10-CM | POA: Diagnosis not present

## 2018-11-23 ENCOUNTER — Other Ambulatory Visit: Payer: Self-pay | Admitting: Adult Health

## 2018-11-23 DIAGNOSIS — F5101 Primary insomnia: Secondary | ICD-10-CM

## 2018-11-23 MED ORDER — ZALEPLON 5 MG PO CAPS: ORAL_CAPSULE | ORAL | 2 refills | Status: DC

## 2018-11-23 NOTE — Progress Notes (Signed)
Refilled patients sonata at this time.

## 2018-11-28 ENCOUNTER — Other Ambulatory Visit: Payer: Medicare Other | Admitting: Urology

## 2018-11-30 ENCOUNTER — Encounter: Payer: Self-pay | Admitting: Cardiovascular Disease

## 2018-11-30 ENCOUNTER — Ambulatory Visit (INDEPENDENT_AMBULATORY_CARE_PROVIDER_SITE_OTHER): Payer: Medicare Other | Admitting: Cardiovascular Disease

## 2018-11-30 ENCOUNTER — Other Ambulatory Visit: Payer: Self-pay

## 2018-11-30 VITALS — BP 102/60 | HR 82 | Ht 66.5 in | Wt 149.8 lb

## 2018-11-30 DIAGNOSIS — Z9861 Coronary angioplasty status: Secondary | ICD-10-CM | POA: Diagnosis not present

## 2018-11-30 DIAGNOSIS — I2510 Atherosclerotic heart disease of native coronary artery without angina pectoris: Secondary | ICD-10-CM | POA: Diagnosis not present

## 2018-11-30 DIAGNOSIS — F5101 Primary insomnia: Secondary | ICD-10-CM

## 2018-11-30 MED ORDER — ZOLPIDEM TARTRATE 10 MG PO TABS: 10.0000 mg | ORAL_TABLET | Freq: Every evening | ORAL | 5 refills | Status: DC | PRN

## 2018-11-30 NOTE — Progress Notes (Signed)
Cardiology Office Note   Date:  11/30/2018   ID:  Vincent Kennedy, DOB 1932-09-06, MRN [id number]  PCP:  Jenne PaneLlc, [facility]  Cardiologist:  Lorine BearsMuhammad Trust Leh, MD   Chief Complaint  Patient presents with  . other    4 month f/u no complaints today. Meds reviewed verbally with pt.      History of Present Illness: Vincent Kennedy is a 85 y.o. male who is here today for follow-up visit regarding coronary artery disease and high-grade AV block status post recent pacemaker placement.[name]. 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  He had previous first diagonal ostial drug-eluting stent placement in 2008. He was diagnosed with pulmonary fibrosis in 2016. He is on continuous oxygen therapy.  He had worsening exertional dyspnea and underwent a right and left cardiac catheterization in October of 2018 which showed severe one-vessel coronary artery disease with patent diagonal stent, 95% heavily calcified stenosis in the mid LAD at the origin of the second diagonal and moderate proximal LAD stenosis.  Ejection fraction was normal.  Right heart catheterization showed normal filling pressures, mild pulmonary hypertension and normal cardiac output. I performed successful angioplasty and drug-eluting stent placement to the proximal and mid LAD. Repeat cardiac catheterization in June 2019 showed widely patent LAD and diagonal stents with no significant restenosis.  There was stable 40% ostial LAD stenosis.  LVEDP was mildly elevated.  He had a Medtronic dual-chamber pacemaker placement in January 2024 high-grade AV block. [facility] June of this year which showed normal LV systolic function, moderate pulmonary hypertension with peak systolic pressure of 51 mmHg.  Bubble study showed right to left atrial shunt. Unfortunately, his shortness of breath has been worsening and he is now requiring 10 to 12 L of oxygen.  No chest pain.  [facility].  Past  Medical History:  Diagnosis Date  . BPH (benign prostatic hyperplasia)   . Coronary artery disease    a. 11/2006 Cath: D1 99ost (2.5x15 Xience DES), D2 90ost (small); b. 12/2017 PCI: LAD 95 (3.25x8 & 3.0x 33 Sierra DES'. D1 patent stent. D2 30ost. EF 55-65%;  c. 08/2017 Cath: LM min irregs, LAD 40ost, patent stents, D1 patent stent, LCX nl, OM1/2/3 nl, RCA 30ost, RPDA/RPAV/RPL1/2/3 nl.  . DOE (dyspnea on exertion)    a. Chronic supplemental O2 in setting of pulm fibrosis; b. 10/2017 Echo: Nl LV size/fxn. Mild Ao sclerosis. Trace to mild TR.  [name]. GERD (gastroesophageal reflux disease)   . Hyperlipidemia   . Hypertension   . On home oxygen therapy    "2-3L; 24/7" (01/19/2017); "6L 24/7" (04/06/2018)  . OSA on CPAP   . Oxygen deficiency   . Pacemaker 04/07/2018  . Pneumonia 07/2016  . Pulmonary fibrosis (HCC) 08/26/2015  . Wears hearing aid    (sometimes)    Past Surgical History:  Procedure Laterality Date  . CATARACT EXTRACTION W/PHACO Right 01/29/2015   Procedure: CATARACT EXTRACTION PHACO AND INTRAOCULAR LENS PLACEMENT (IOC);  Surgeon: [name], MD;  [facility];  Service: Ophthalmology;  Laterality: Right;  RESTOR LENS CPAP  . CATARACT EXTRACTION W/PHACO Left 03/12/2015   Procedure: CATARACT EXTRACTION PHACO AND INTRAOCULAR LENS PLACEMENT (IOC);  Surgeon: [name], MD;  [facility];  Service: Ophthalmology;  Laterality: Left;  RESTOR SHUGARCAINE  . COLONOSCOPY WITH ESOPHAGOGASTRODUODENOSCOPY (EGD)    . CORONARY ANGIOPLASTY WITH STENT PLACEMENT  2010   "1 stent"  . CORONARY  ANGIOPLASTY WITH STENT PLACEMENT  01/19/2017  . CORONARY ATHERECTOMY N/A 01/19/2017   Procedure: CORONARY ATHERECTOMY - CSI;  Surgeon: Iran Ouch, MD;  Location: [facility];  Service: Cardiovascular;  Laterality: N/A;  unable to cross lesion  . CORONARY STENT INTERVENTION N/A 01/19/2017   Procedure: CORONARY STENT INTERVENTION;  Surgeon: Iran Ouch, MD;  Location: [facility];  Service: Cardiovascular;  Laterality: N/A;  . INGUINAL HERNIA REPAIR Bilateral   . LEFT HEART CATH AND CORONARY ANGIOGRAPHY Left 09/12/2017   Procedure: LEFT HEART CATH AND CORONARY ANGIOGRAPHY;  Surgeon: Iran Ouch, MD;  Location: [facility] INVASIVE CV LAB;  Service: Cardiovascular;  Laterality: Left;  . PACEMAKER IMPLANT N/A 04/07/2018   Procedure: PACEMAKER IMPLANT;  Surgeon: Marinus Maw, MD;  [facility];  Service: Cardiovascular;  Laterality: N/A;  . RIGHT/LEFT HEART CATH AND CORONARY ANGIOGRAPHY N/A 01/12/2017   Procedure: RIGHT/LEFT HEART CATH AND CORONARY ANGIOGRAPHY;  Surgeon: Iran Ouch, MD;  Location: [facility];  Service: Cardiovascular;  Laterality: N/A;     Current Outpatient Medications  Medication Sig Dispense Refill  . ACCU-CHEK FASTCLIX LANCETS MISC Use as directed twice day prn diag e11.65 100 each 1  . aspirin EC 81 MG EC tablet Take 1 tablet (81 mg total) by mouth at bedtime. 90 tablet 3  . budesonide (PULMICORT) 0.5 MG/2ML nebulizer solution USE 1 VIAL VIA NEBULIZER  TWO TIMES DAILY (Patient taking differently: Take 0.5 mg by nebulization 2 (two) times daily. ) 360 mL 11  . finasteride (PROSCAR) 5 MG tablet Take 1 tablet (5 mg total) by mouth daily. 90 tablet 3  . furosemide (LASIX) 20 MG tablet Take 1 tablet po QOD as directed (Patient taking differently: Take 20 mg by mouth as needed. ) 30 tablet 2  . glucose blood (ACCU-CHEK GUIDE) test strip Use as instructed twice a day prn diag e11.65 100 each 1  . ipratropium-albuterol (DUONEB) 0.5-2.5 (3) MG/3ML SOLN Inhale into the lungs.    . metoprolol tartrate (LOPRESSOR) 25 MG tablet TAKE 1 TABLET BY MOUTH TWO  TIMES DAILY (Patient taking differently: Take 12.5 mg by mouth 2 (two) times daily. ) 180 tablet 3  . mirtazapine (REMERON) 30 MG tablet Take 30 mg by mouth at bedtime.    . mometasone-formoterol (DULERA) 100-5 MCG/ACT AERO Inhale 2  puffs into the lungs 2 (two) times daily. 1 Inhaler 11  . montelukast (SINGULAIR) 10 MG tablet Take 1 tablet (10 mg total) by mouth at bedtime. (Patient taking differently: Take 10 mg by mouth at bedtime as needed. ) 90 tablet 3  . nitroGLYCERIN (NITROSTAT) 0.4 MG SL tablet Place 1 tablet (0.4 mg total) under the tongue every 5 (five) minutes as needed for chest pain. 25 tablet 12  . OXYGEN Inhale 6 L into the lungs continuous.    . pantoprazole (PROTONIX) 40 MG tablet TAKE 1 TABLET BY MOUTH  DAILY AS NEEDED FOR  HEARTBURN 90 tablet 3  . Pirfenidone (ESBRIET) 267 MG CAPS Take 801 mg by mouth 3 (three) times daily.     . rivastigmine (EXELON) 9.5 mg/24hr Place 1 patch (9.5 mg total) onto the skin daily. 90 patch 2  . rosuvastatin (CRESTOR) 40 MG tablet TAKE 1 TABLET BY MOUTH  EVERY DAY 90 tablet 3  . zaleplon (SONATA) 5 MG capsule Take 1 tablet po QHS prn. May repeat dose one time prn 60 capsule 2  . zolpidem (AMBIEN) 10 MG tablet Take  1 tablet (10 mg total) by mouth at bedtime as needed. 20 tablet 5   No current facility-administered medications for this visit.     Allergies:   Patient has no known allergies.    Social History:  The patient  reports that he has never smoked. He has never used smokeless tobacco. He reports that he does not drink alcohol or use drugs.   Family History:  The patient's family history includes CAD in his brother and father.    ROS:  Please see the history of present illness.   Otherwise, review of systems are positive for none.   All other systems are reviewed and negative.    PHYSICAL EXAM: VS:  BP 102/60 (BP Location: Left Arm, Patient Position: Sitting, Cuff Size: Normal)   Pulse 82   Ht 5' 6.5" (1.689 m)   Wt 149 lb 12 oz (67.9 kg)   SpO2 99%   BMI 23.81 kg/m  , BMI Body mass index is 23.81 kg/m. GEN: Well nourished, well developed, in no acute distress  HEENT: normal  Neck: no JVD, carotid bruits, or masses Cardiac: RRR; no murmurs, rubs, or  gallops,no edema Respiratory: Dry crackles with intermittent gasping. GI: soft, nontender, nondistended, + BS MS: no deformity or atrophy  Skin: warm and dry, no rash Neuro:  Strength and sensation are intact Psych: euthymic mood, full affect   EKG:  EKG is ordered today. The ekg ordered today demonstrates normal sinus rhythm with left axis deviation and borderline LVH.  Recent Labs: 04/06/2018: ALT 16; Hemoglobin 13.3; Magnesium 2.1; Platelets 168; TSH 1.767 04/07/2018: BUN 14; Creatinine, Ser 0.89; Potassium 4.0; Sodium 140    Lipid Panel    Component Value Date/Time   CHOL 102 04/06/2018 2351   CHOL 131 03/11/2017 1055   TRIG 38 04/06/2018 2351   HDL 30 (L) 04/06/2018 2351   HDL 38 (L) 03/11/2017 1055   CHOLHDL 3.4 04/06/2018 2351   VLDL 8 04/06/2018 2351   LDLCALC 64 04/06/2018 2351   LDLCALC 73 03/11/2017 1055      Wt Readings from Last 3 Encounters:  11/30/18 149 lb 12 oz (67.9 kg)  10/10/18 152 lb (68.9 kg)  09/07/18 156 lb (70.8 kg)     No flowsheet data found.    ASSESSMENT AND PLAN:  1.  Coronary artery disease involving native coronary arteries without angina: Fortunately, he is not having any anginal symptoms.  Cardiac catheterization in June 2019 showed patent stents with no obstructive disease.  Continue aspirin indefinitely.  2.  High-grade AV block status post recent pacemaker placement: The pacemaker seems to be functioning normally.  3. Hyperlipidemia: Currently on rosuvastatin 40 mg once daily.   Most recent LDL was 64  4. Pulmonary fibrosis: Managed by [facility] and Dr. Welton FlakesKhan.  Unfortunately, he seems to be worsening.  5. Essential hypertension: Blood pressure is well controlled on metoprolol and amlodipine.  6.  Pulmonary hypertension: Likely due to pulmonary fibrosis.  I do not think is a good candidate for vasodilator therapy or specific pulmonary hypertension therapy given his extensive pulmonary fibrosis.  I do not think he has  significant heart failure to be contributing.  Thus, the utility of right heart catheterization is going to be low.  7.  Possible PFO with right-to-left shunt: Certainly, this could be leading to hypoxia but with the presence of pulmonary hypertension and underlying pulmonary fibrosis, PFO closure is not a good idea.   Disposition:   FU with me in 6  months    Signed, Kathlyn Sacramento, MD 11/30/18 [place], [place]

## 2018-11-30 NOTE — Patient Instructions (Signed)
Medication Instructions:  Your physician recommends that you continue on your current medications as directed. Please refer to the Current Medication list given to you today.  If you need a refill on your cardiac medications before your next appointment, please call your pharmacy.   Lab work: None ordered If you have labs (blood work) drawn today and your tests are completely normal, you will receive your results only by: . MyChart Message (if you have MyChart) OR . A paper copy in the mail If you have any lab test that is abnormal or we need to change your treatment, we will call you to review the results.  Testing/Procedures: None ordered  Follow-Up: At [facility], you and your health needs are our priority.  As part of our continuing mission to provide you with exceptional heart care, we have created designated Provider Care Teams.  These Care Teams include your primary Cardiologist (physician) and Advanced Practice Providers (APPs -  Physician Assistants and Nurse Practitioners) who all work together to provide you with the care you need, when you need it. You will need a follow up appointment in 6 months.  Please call our office 2 months in advance to schedule this appointment.  You may see [name], MD or one of the following Advanced Practice Providers on your designated Care Team:   Christopher Berge, NP Ryan Dunn, PA-C . Jacquelyn Visser, PA-C  Any Other Special Instructions Will Be Listed Below (If Applicable). N/A   

## 2018-12-05 ENCOUNTER — Ambulatory Visit: Payer: Medicare Other

## 2018-12-08 DIAGNOSIS — J849 Interstitial pulmonary disease, unspecified: Secondary | ICD-10-CM | POA: Diagnosis not present

## 2018-12-11 ENCOUNTER — Telehealth: Payer: Self-pay | Admitting: Urology

## 2018-12-11 NOTE — Telephone Encounter (Signed)
Called pt. And ask him why he cancelled his CT, patient states he is no longer having symptoms and cancelled his f/u appointment with Dr. Erlene Quan as well.

## 2018-12-13 ENCOUNTER — Telehealth: Payer: Self-pay | Admitting: Urology

## 2018-12-13 ENCOUNTER — Other Ambulatory Visit: Payer: Medicare Other | Admitting: Urology

## 2018-12-13 ENCOUNTER — Encounter: Payer: Self-pay | Admitting: Urology

## 2018-12-13 NOTE — Telephone Encounter (Signed)
Pt was a no show for cysto.  Just F.Y.I. °

## 2018-12-15 DIAGNOSIS — J479 Bronchiectasis, uncomplicated: Secondary | ICD-10-CM | POA: Diagnosis not present

## 2019-01-07 DIAGNOSIS — J849 Interstitial pulmonary disease, unspecified: Secondary | ICD-10-CM | POA: Diagnosis not present

## 2019-01-08 ENCOUNTER — Ambulatory Visit (INDEPENDENT_AMBULATORY_CARE_PROVIDER_SITE_OTHER): Payer: Medicare Other | Admitting: *Deleted

## 2019-01-08 DIAGNOSIS — I442 Atrioventricular block, complete: Secondary | ICD-10-CM | POA: Diagnosis not present

## 2019-01-08 LAB — CUP PACEART REMOTE DEVICE CHECK
Battery Remaining Longevity: 133 mo
Battery Voltage: 3.06 V
Brady Statistic AP VP Percent: 0.1 %
Brady Statistic AP VS Percent: 33.01 %
Brady Statistic AS VP Percent: 0.03 %
Brady Statistic AS VS Percent: 66.86 %
Brady Statistic RA Percent Paced: 33.11 %
Brady Statistic RV Percent Paced: 0.13 %
Date Time Interrogation Session: 20201019073044
Implantable Lead Implant Date: 20200117
Implantable Lead Implant Date: 20200117
Implantable Lead Location: 753859
Implantable Lead Location: 753860
Implantable Lead Model: 3830
Implantable Lead Model: 5076
Implantable Pulse Generator Implant Date: 20200117
Lead Channel Impedance Value: 342 Ohm
Lead Channel Impedance Value: 399 Ohm
Lead Channel Impedance Value: 399 Ohm
Lead Channel Impedance Value: 532 Ohm
Lead Channel Pacing Threshold Amplitude: 0.5 V
Lead Channel Pacing Threshold Amplitude: 0.875 V
Lead Channel Pacing Threshold Pulse Width: 0.4 ms
Lead Channel Pacing Threshold Pulse Width: 0.4 ms
Lead Channel Sensing Intrinsic Amplitude: 1.5 mV
Lead Channel Sensing Intrinsic Amplitude: 1.5 mV
Lead Channel Sensing Intrinsic Amplitude: 11.375 mV
Lead Channel Sensing Intrinsic Amplitude: 11.375 mV
Lead Channel Setting Pacing Amplitude: 1.5 V
Lead Channel Setting Pacing Amplitude: 2.5 V
Lead Channel Setting Pacing Pulse Width: 1 ms
Lead Channel Setting Sensing Sensitivity: 1.2 mV

## 2019-01-14 DIAGNOSIS — J479 Bronchiectasis, uncomplicated: Secondary | ICD-10-CM | POA: Diagnosis not present

## 2019-01-26 NOTE — Progress Notes (Signed)
Remote pacemaker transmission.   

## 2019-02-07 DIAGNOSIS — J849 Interstitial pulmonary disease, unspecified: Secondary | ICD-10-CM | POA: Diagnosis not present

## 2019-02-08 ENCOUNTER — Telehealth: Payer: Self-pay

## 2019-02-08 NOTE — Telephone Encounter (Signed)
Faxed mask order RX to [facility] for non-rebreathing mask. Beth

## 2019-02-12 ENCOUNTER — Other Ambulatory Visit: Payer: Self-pay

## 2019-02-12 DIAGNOSIS — Z20822 Contact with and (suspected) exposure to covid-19: Secondary | ICD-10-CM

## 2019-02-12 MED ORDER — CIPROFLOXACIN HCL 500 MG PO TABS: 500.0000 mg | ORAL_TABLET | Freq: Two times a day (BID) | ORAL | 0 refills | Status: DC

## 2019-02-12 NOTE — Telephone Encounter (Signed)
As per dr [name] send cipro for 7 days

## 2019-02-14 ENCOUNTER — Emergency Department
Admission: EM | Admit: 2019-02-14 | Discharge: 2019-02-15 | Disposition: A | Discharge status: Home / Self Care | Payer: Medicare Other | Attending: Student | Admitting: Student

## 2019-02-14 ENCOUNTER — Other Ambulatory Visit: Payer: Self-pay

## 2019-02-14 ENCOUNTER — Encounter: Payer: Self-pay | Admitting: *Deleted

## 2019-02-14 DIAGNOSIS — R1110 Vomiting, unspecified: Secondary | ICD-10-CM | POA: Diagnosis not present

## 2019-02-14 DIAGNOSIS — Z955 Presence of coronary angioplasty implant and graft: Secondary | ICD-10-CM | POA: Insufficient documentation

## 2019-02-14 DIAGNOSIS — I10 Essential (primary) hypertension: Secondary | ICD-10-CM | POA: Insufficient documentation

## 2019-02-14 DIAGNOSIS — Z950 Presence of cardiac pacemaker: Secondary | ICD-10-CM | POA: Diagnosis not present

## 2019-02-14 DIAGNOSIS — Z7982 Long term (current) use of aspirin: Secondary | ICD-10-CM | POA: Diagnosis not present

## 2019-02-14 DIAGNOSIS — N202 Calculus of kidney with calculus of ureter: Secondary | ICD-10-CM | POA: Insufficient documentation

## 2019-02-14 DIAGNOSIS — R1032 Left lower quadrant pain: Secondary | ICD-10-CM | POA: Diagnosis not present

## 2019-02-14 DIAGNOSIS — G309 Alzheimer's disease, unspecified: Secondary | ICD-10-CM | POA: Diagnosis not present

## 2019-02-14 DIAGNOSIS — R109 Unspecified abdominal pain: Secondary | ICD-10-CM | POA: Diagnosis not present

## 2019-02-14 DIAGNOSIS — Z79899 Other long term (current) drug therapy: Secondary | ICD-10-CM | POA: Diagnosis not present

## 2019-02-14 DIAGNOSIS — I2510 Atherosclerotic heart disease of native coronary artery without angina pectoris: Secondary | ICD-10-CM | POA: Diagnosis not present

## 2019-02-14 DIAGNOSIS — J479 Bronchiectasis, uncomplicated: Secondary | ICD-10-CM | POA: Diagnosis not present

## 2019-02-14 DIAGNOSIS — F0280 Dementia in other diseases classified elsewhere without behavioral disturbance: Secondary | ICD-10-CM | POA: Diagnosis not present

## 2019-02-14 LAB — CBC
HCT: 41.8 % (ref 39.0–52.0)
Hemoglobin: 13.6 g/dL (ref 13.0–17.0)
MCH: 29.3 pg (ref 26.0–34.0)
MCHC: 32.5 g/dL (ref 30.0–36.0)
MCV: 90.1 fL (ref 80.0–100.0)
Platelets: 164 10*3/uL (ref 150–400)
RBC: 4.64 MIL/uL (ref 4.22–5.81)
RDW: 13.4 % (ref 11.5–15.5)
WBC: 9.2 10*3/uL (ref 4.0–10.5)
nRBC: 0 % (ref 0.0–0.2)

## 2019-02-14 LAB — NOVEL CORONAVIRUS, NAA: SARS-CoV-2, NAA: NOT DETECTED

## 2019-02-14 MED ORDER — ONDANSETRON HCL 4 MG/2ML IJ SOLN
4.0000 mg | Freq: Once | INTRAMUSCULAR | Status: AC
  Administered 2019-02-14: 4 mg via INTRAVENOUS

## 2019-02-14 MED ORDER — ONDANSETRON HCL 4 MG/2ML IJ SOLN
INTRAMUSCULAR | Status: AC
  Filled 2019-02-14: qty 2

## 2019-02-14 MED ORDER — SODIUM CHLORIDE 0.9% FLUSH
3.0000 mL | Freq: Once | INTRAVENOUS | Status: AC
  Administered 2019-02-14: 3 mL via INTRAVENOUS

## 2019-02-14 MED ORDER — FENTANYL CITRATE (PF) 100 MCG/2ML IJ SOLN
25.0000 ug | Freq: Once | INTRAMUSCULAR | Status: AC
  Administered 2019-02-14: 25 ug via INTRAVENOUS
  Filled 2019-02-14: qty 2

## 2019-02-14 NOTE — ED Provider Notes (Signed)
[facility] Emergency Department Provider Note  ____________________________________________   First MD Initiated Contact with Patient 02/14/19 2329     (approximate)  I have reviewed the triage vital signs and the nursing notes.  History  Chief Complaint Abdominal Pain    HPI Vincent Kennedy is a 85 y.o. male with hx of BPH, CAD, pulmonary fibrosis on chronic oxygen. Presents for LLQ abdominal pain.  Pain started on Sunday.  He describes it as a constant, dull pain, with intermittent episodes of sharp, more severe pain.  Associated with nausea, and vomiting on Sunday.  He denies any diarrhea or fevers.  He was empirically started on ciprofloxacin on Sunday for presumed diverticulitis.  He denies any history of diverticulitis or nephrolithiasis.  No dysuria or hematuria.  Last bowel movement was 2 days ago. He states he initially felt like he was improving, and then had another episode of severe pain today which prompted evaluation.  Recently swabbed for COVID on 11/23 and was negative.    Past Medical Hx Past Medical History:  Diagnosis Date   BPH (benign prostatic hyperplasia)    Coronary artery disease    a. 11/2006 Cath: D1 99ost (2.5x15 Xience DES), D2 90ost (small); b. 12/2017 PCI: LAD 95 (3.25x8 & 3.0x 33 Sierra DES'. D1 patent stent. D2 30ost. EF 55-65%;  c. 08/2017 Cath: LM min irregs, LAD 40ost, patent stents, D1 patent stent, LCX nl, OM1/2/3 nl, RCA 30ost, RPDA/RPAV/RPL1/2/3 nl.   DOE (dyspnea on exertion)    a. Chronic supplemental O2 in setting of pulm fibrosis; b. 10/2017 Echo: Nl LV size/fxn. Mild Ao sclerosis. Trace to mild TR.   GERD (gastroesophageal reflux disease)    Hyperlipidemia    Hypertension    On home oxygen therapy    "2-3L; 24/7" (01/19/2017); "6L 24/7" (04/06/2018)   OSA on CPAP    Oxygen deficiency    Pacemaker 04/07/2018   Pneumonia 07/2016   Pulmonary fibrosis (HCC) 08/26/2015   Wears hearing aid    (sometimes)    Problem List Patient Active Problem List   Diagnosis Date Noted   S/P placement of cardiac pacemaker 04/08/2018   Complete heart block (HCC) 04/06/2018   Effort angina (HCC) 01/19/2017   CAD S/P percutaneous coronary angioplasty 11/25/2016   Cataracts, bilateral 11/25/2016   Chronic hypoxemic respiratory failure (HCC) 11/25/2016   GERD (gastroesophageal reflux disease) 11/25/2016   Inguinal hernia 11/25/2016   OSA on CPAP 11/25/2016   Urinary retention    Pneumonia 08/15/2016   Need for vaccination for Strep pneumoniae 04/28/2016   Incomplete emptying of bladder 09/10/2015   Pulmonary fibrosis (HCC) 08/26/2015   Medication monitoring encounter 08/26/2015   Elevated antinuclear antibody (ANA) level 04/11/2015   Dyspnea 03/18/2015   Coronary artery disease involving native coronary artery of native heart without angina pectoris 10/16/2014   Auditory disturbance 10/16/2014   Hypertension goal BP (blood pressure) < 150/90 10/16/2014   Alzheimer's dementia without behavioral disturbance (HCC) 10/16/2014   Status post insertion of drug eluting coronary artery stent 10/16/2014   Medicare annual wellness visit, subsequent 10/16/2014   Coronary artery disease    Hyperlipidemia     Past Surgical Hx Past Surgical History:  Procedure Laterality Date   CATARACT EXTRACTION W/PHACO Right 01/29/2015   Procedure: CATARACT EXTRACTION PHACO AND INTRAOCULAR LENS PLACEMENT (IOC);  Surgeon: [name], MD;  [facility];  Service: Ophthalmology;  Laterality: Right;  RESTOR LENS CPAP   [facility] 03/12/2015  Procedure: CATARACT EXTRACTION PHACO AND INTRAOCULAR LENS PLACEMENT (IOC);  Surgeon: Leandrew Koyanagi, MD;  Location: [place];  Service: Ophthalmology;  Laterality: Left;  RESTOR SHUGARCAINE   COLONOSCOPY WITH ESOPHAGOGASTRODUODENOSCOPY (EGD)     CORONARY ANGIOPLASTY WITH STENT PLACEMENT   2010   "1 stent"   CORONARY ANGIOPLASTY WITH STENT PLACEMENT  01/19/2017   CORONARY ATHERECTOMY N/A 01/19/2017   Procedure: CORONARY ATHERECTOMY - CSI;  Surgeon: Wellington Hampshire, MD;  Location: [place] CV LAB;  Service: Cardiovascular;  Laterality: N/A;  unable to cross lesion   CORONARY STENT INTERVENTION N/A 01/19/2017   Procedure: CORONARY STENT INTERVENTION;  Surgeon: Wellington Hampshire, MD;  Location: [place] CV LAB;  Service: Cardiovascular;  Laterality: N/A;   INGUINAL HERNIA REPAIR Bilateral    LEFT HEART CATH AND CORONARY ANGIOGRAPHY Left 09/12/2017   Procedure: LEFT HEART CATH AND CORONARY ANGIOGRAPHY;  Surgeon: Wellington Hampshire, MD;  Location: [place] CV LAB;  Service: Cardiovascular;  Laterality: Left;   PACEMAKER IMPLANT N/A 04/07/2018   Procedure: PACEMAKER IMPLANT;  Surgeon: Evans Lance, MD;  Location: [place] CV LAB;  Service: Cardiovascular;  Laterality: N/A;   RIGHT/LEFT HEART CATH AND CORONARY ANGIOGRAPHY N/A 01/12/2017   Procedure: RIGHT/LEFT HEART CATH AND CORONARY ANGIOGRAPHY;  Surgeon: Wellington Hampshire, MD;  Location: [place] CV LAB;  Service: Cardiovascular;  Laterality: N/A;    Medications Prior to Admission medications   Medication Sig Start Date End Date Taking? Authorizing Provider  ACCU-CHEK FASTCLIX LANCETS MISC Use as directed twice day prn diag e11.65 01/25/18   Lavera Guise, MD  aspirin EC 81 MG EC tablet Take 1 tablet (81 mg total) by mouth at bedtime. 04/08/18   Daune Perch, NP  budesonide (PULMICORT) 0.5 MG/2ML nebulizer solution USE 1 VIAL VIA NEBULIZER  TWO TIMES DAILY Patient taking differently: Take 0.5 mg by nebulization 2 (two) times daily.  03/01/18   Kendell Bane, NP  ciprofloxacin (CIPRO) 500 MG tablet Take 1 tablet (500 mg total) by mouth 2 (two) times daily. 02/12/19   Lavera Guise, MD  finasteride (PROSCAR) 5 MG tablet Take 1 tablet (5 mg total) by mouth daily. 06/08/18   Wellington Hampshire, MD    furosemide (LASIX) 20 MG tablet Take 1 tablet po QOD as directed Patient taking differently: Take 20 mg by mouth as needed.  08/18/18   Ronnell Freshwater, NP  glucose blood (ACCU-CHEK GUIDE) test strip Use as instructed twice a day prn diag e11.65 01/25/18   Lavera Guise, MD  ipratropium-albuterol (DUONEB) 0.5-2.5 (3) MG/3ML SOLN Inhale into the lungs. 09/12/18   [provider]  metoprolol tartrate (LOPRESSOR) 25 MG tablet TAKE 1 TABLET BY MOUTH TWO  TIMES DAILY Patient taking differently: Take 12.5 mg by mouth 2 (two) times daily.  05/29/18   Wellington Hampshire, MD  mirtazapine (REMERON) 30 MG tablet Take 30 mg by mouth at bedtime. 06/05/18   [provider]  mometasone-formoterol (DULERA) 100-5 MCG/ACT AERO Inhale 2 puffs into the lungs 2 (two) times daily. 02/09/18   Allyne Gee, MD  montelukast (SINGULAIR) 10 MG tablet Take 1 tablet (10 mg total) by mouth at bedtime. Patient taking differently: Take 10 mg by mouth at bedtime as needed.  06/05/18   Lavera Guise, MD  nitroGLYCERIN (NITROSTAT) 0.4 MG SL tablet Place 1 tablet (0.4 mg total) under the tongue every 5 (five) minutes as needed for chest pain. 01/20/17   Leanor Kail, PA  OXYGEN Inhale 6 L into the lungs continuous.    [provider]  pantoprazole (PROTONIX) 40 MG tablet TAKE 1 TABLET BY MOUTH  DAILY AS NEEDED FOR  HEARTBURN 11/03/18   Iran Ouch, MD  Pirfenidone (ESBRIET) 267 MG CAPS Take 801 mg by mouth 3 (three) times daily.     [provider]  rivastigmine (EXELON) 9.5 mg/24hr Place 1 patch (9.5 mg total) onto the skin daily. 05/26/18   Iran Ouch, MD  rosuvastatin (CRESTOR) 40 MG tablet TAKE 1 TABLET BY MOUTH  EVERY DAY 05/29/18   Iran Ouch, MD  zaleplon (SONATA) 5 MG capsule Take 1 tablet po QHS prn. May repeat dose one time prn 11/23/18   Johnna Acosta, NP  zolpidem (AMBIEN) 10 MG tablet Take 1 tablet (10 mg total) by mouth at bedtime as needed. 11/30/18   Iran Ouch, MD    Allergies Patient has no known allergies.  Family Hx Family History  Problem Relation Age of Onset   CAD Father    CAD Brother     Social Hx Social History   Tobacco Use   Smoking status: Never Smoker   Smokeless tobacco: Never Used  Substance Use Topics   Alcohol use: No    Alcohol/week: 0.0 standard drinks   Drug use: No     Review of Systems  Constitutional: Negative for fever, chills. Eyes: Negative for visual changes. ENT: Negative for sore throat. Cardiovascular: Negative for chest pain. Respiratory: Negative for shortness of breath. Gastrointestinal: + for nausea, vomiting, abdominal pain.  Genitourinary: Negative for dysuria. Musculoskeletal: Negative for leg swelling. Skin: Negative for rash. Neurological: Negative for for headaches.   Physical Exam  Vital Signs: ED Triage Vitals [02/14/19 2317]  Enc Vitals Group     BP (!) 183/88     Pulse Rate 74     Resp (!) 26     Temp 97.8 F (36.6 C)     Temp Source Oral     SpO2 94 %     Weight      Height      Head Circumference      Peak Flow      Pain Score      Pain Loc      Pain Edu?      Excl. in GC?     Constitutional: Alert and oriented.  Head: Normocephalic. Atraumatic. Eyes: Conjunctivae clear. Sclera anicteric. Nose: No congestion. No rhinorrhea. Mouth/Throat: Wearing mask.  Neck: No stridor.   Cardiovascular: Normal rate, regular rhythm. Extremities well perfused. Respiratory: Lungs CTAB. On NRB - which is his baseline.  Gastrointestinal: Soft. TTP LLQ, no rebound or guarding.  Remainder of abdomen is soft, non-tender.  Nondistended. Musculoskeletal: Digital clubbing.  Neurologic:  Normal speech and language. No gross focal neurologic deficits are appreciated.  Skin: Skin is warm, dry and intact. No rash noted. Psychiatric: Mood and affect are appropriate for situation.  EKG  N/A   Radiology  CT: IMPRESSION:  1. Left moderate hydroureteronephrosis  to the level of a 4 mm  calculus in the mid left ureter. Delayed left nephrogram is likely  physiologic and related to this obstruction.  2. Additional punctate calcification layering in the left lateral  dependent bladder may reflect a recently passed stone.  3. Moderate stool burden within the right colon as well as  inspissated fecal material in the rectal vault, suggestive of  constipation with possible fecal impaction. No evidence of  mechanical  obstruction.  4. Prostatomegaly.  5. Bilateral inguinal hernia repairs with some recurrent fat  herniation.  6. Basilar lung fibrosis.  7. Cardiomegaly.  8. Aortic Atherosclerosis (ICD10-I70.0).    Procedures  Procedure(s) performed (including critical care):  Procedures   Initial Impression / Assessment and Plan / ED Course  85 y.o. male who presents to the ED for LLQ abdominal pain, nausea, vomiting. Started on ciprofloxacin on Sunday for presumed diverticulitis.   Ddx: diverticulitis, nephrolithiasis, colitis  Will obtain labs, urine, imaging.   Work-up reveals left-sided ureterolithiasis.  No evidence of infection on UA, though the patient has been on a course of ciprofloxacin since Sunday.  Given he is in the middle of this course, advise completion.  Very mild AKI, creatinine 1.37, receiving a small fluid bolus.  No leukocytosis or fever.  After medication, patient has adequate pain, nausea control and feels comfortable with discharge. Has tolerated PO in the ED. Will advise follow-up with urology, provided strainer.  Patient voices understanding is comfortable to plan and discharge. Discussed strict return precautions.    Final Clinical Impression(s) / ED Diagnosis  Final diagnoses:  LLQ abdominal pain  Vomiting in adult  Nephroureterolithiasis       Note:  This document was prepared using Dragon voice recognition software and may include unintentional dictation errors.   Miguel Aschoff., MD 02/15/19 [phone]

## 2019-02-14 NOTE — ED Triage Notes (Addendum)
Left lower abdominal pain for about 3 days,constipation x 2 days, has had some vomiting, denies fevers. Pt was started on cipro on Sunday for potential diverticulitis. Pt has hx of pulmonary fibrosis, chronic NRB.

## 2019-02-15 ENCOUNTER — Emergency Department: Payer: Medicare Other

## 2019-02-15 ENCOUNTER — Encounter: Payer: Self-pay | Admitting: Radiology

## 2019-02-15 DIAGNOSIS — R109 Unspecified abdominal pain: Secondary | ICD-10-CM | POA: Diagnosis not present

## 2019-02-15 LAB — COMPREHENSIVE METABOLIC PANEL
ALT: 16 U/L (ref 0–44)
AST: 25 U/L (ref 15–41)
Albumin: 3.4 g/dL — ABNORMAL LOW (ref 3.5–5.0)
Alkaline Phosphatase: 67 U/L (ref 38–126)
Anion gap: 8 (ref 5–15)
BUN: 28 mg/dL — ABNORMAL HIGH (ref 8–23)
CO2: 26 mmol/L (ref 22–32)
Calcium: 8.9 mg/dL (ref 8.9–10.3)
Chloride: 107 mmol/L (ref 98–111)
Creatinine, Ser: 1.37 mg/dL — ABNORMAL HIGH (ref 0.61–1.24)
GFR calc Af Amer: 55 mL/min — ABNORMAL LOW (ref >60)
GFR calc non Af Amer: 47 mL/min — ABNORMAL LOW (ref >60)
Glucose, Bld: 131 mg/dL — ABNORMAL HIGH (ref 70–99)
Potassium: 4.2 mmol/L (ref 3.5–5.1)
Sodium: 141 mmol/L (ref 135–145)
Total Bilirubin: 0.5 mg/dL (ref 0.3–1.2)
Total Protein: 8.2 g/dL — ABNORMAL HIGH (ref 6.5–8.1)

## 2019-02-15 LAB — URINALYSIS, COMPLETE (UACMP) WITH MICROSCOPIC
Bacteria, UA: NONE SEEN
Bilirubin Urine: NEGATIVE
Glucose, UA: NEGATIVE mg/dL
Ketones, ur: NEGATIVE mg/dL
Leukocytes,Ua: NEGATIVE
Nitrite: NEGATIVE
Protein, ur: NEGATIVE mg/dL
RBC / HPF: >50 RBC/hpf — ABNORMAL HIGH (ref 0–5)
Specific Gravity, Urine: 1.027 (ref 1.005–1.030)
pH: 5 (ref 5.0–8.0)

## 2019-02-15 LAB — LIPASE, BLOOD: Lipase: 26 U/L (ref 11–51)

## 2019-02-15 MED ORDER — IOHEXOL 300 MG/ML  SOLN
100.0000 mL | Freq: Once | INTRAMUSCULAR | Status: AC | PRN
  Administered 2019-02-15: 100 mL via INTRAVENOUS

## 2019-02-15 MED ORDER — ONDANSETRON HCL 4 MG PO TABS: 4.0000 mg | ORAL_TABLET | Freq: Three times a day (TID) | ORAL | 0 refills | Status: AC | PRN

## 2019-02-15 MED ORDER — SODIUM CHLORIDE 0.9 % IV BOLUS: 500.0000 mL | Freq: Once | INTRAVENOUS | Status: DC

## 2019-02-15 MED ORDER — OXYCODONE HCL 5 MG PO TABS: 5.0000 mg | ORAL_TABLET | Freq: Four times a day (QID) | ORAL | 0 refills | Status: AC | PRN

## 2019-02-15 MED ORDER — TAMSULOSIN HCL 0.4 MG PO CAPS: 0.4000 mg | ORAL_CAPSULE | Freq: Every day | ORAL | 0 refills | Status: AC

## 2019-02-15 MED ORDER — KETOROLAC TROMETHAMINE 30 MG/ML IJ SOLN
15.0000 mg | Freq: Once | INTRAMUSCULAR | Status: AC
  Administered 2019-02-15: 15 mg via INTRAVENOUS
  Filled 2019-02-15: qty 1

## 2019-02-15 MED ORDER — SODIUM CHLORIDE 0.9 % IV BOLUS
250.0000 mL | Freq: Once | INTRAVENOUS | Status: AC
  Administered 2019-02-15: 250 mL via INTRAVENOUS

## 2019-02-15 NOTE — Discharge Instructions (Addendum)
Thank you for letting us take care of you in the emergency department today.   Please continue to take any regular, prescribed medications.   New medications we have prescribed:  - Zofran, take as needed for nausea - Tamsulosin, take nightly to help pass the stone, be careful as this medication can make you dehydrated, so be careful when going from laying to sitting, or sitting to standing to avoid lightheadedness - Oxycodone, take as needed for pain you cannot control with over-the-counter ibuprofen or Tylenol  Please follow up with: - Urology, to review your ER visit - Your primary care doctor to review your ER visit and follow up on your symptoms.   Please return to the ER for any new or worsening symptoms.

## 2019-02-21 ENCOUNTER — Other Ambulatory Visit: Payer: Self-pay | Admitting: Radiology

## 2019-02-21 ENCOUNTER — Telehealth: Payer: Self-pay | Admitting: Cardiovascular Disease

## 2019-02-21 ENCOUNTER — Ambulatory Visit: Payer: Medicare Other | Admitting: Urology

## 2019-02-21 ENCOUNTER — Other Ambulatory Visit: Payer: Self-pay

## 2019-02-21 VITALS — BP 123/67 | HR 81 | Ht 70.0 in | Wt 149.0 lb

## 2019-02-21 DIAGNOSIS — N179 Acute kidney failure, unspecified: Secondary | ICD-10-CM

## 2019-02-21 DIAGNOSIS — K5903 Drug induced constipation: Secondary | ICD-10-CM

## 2019-02-21 DIAGNOSIS — N1330 Unspecified hydronephrosis: Secondary | ICD-10-CM

## 2019-02-21 DIAGNOSIS — N201 Calculus of ureter: Secondary | ICD-10-CM | POA: Diagnosis not present

## 2019-02-21 NOTE — Telephone Encounter (Signed)
Lithotripsy is with moderate sedation so should be acceptable risk. Hold Plavix and aspirin 5 days before lithotripsy.

## 2019-02-21 NOTE — Telephone Encounter (Signed)
Dr. Fletcher Anon, pt has hx of DES in 2008 and 2018. Cardiac catheterization in June 2019 showed patent stents with no obstructive disease. He also has pulmonary HTN likely die to extensive pulmonary fibrosis. Also has possible PFO with R to L shunt.   It appears that he is no longer on plavix. Can aspirin be held for 5 days for shockwave lithotripsy?  Please route response back to P CV DIV PREOP  Thank you

## 2019-02-21 NOTE — Progress Notes (Signed)
02/21/2019 11:33 AM   Vincent Kennedy 04-13-1932 [id number]  Referring provider: [facility] [address] [place],  [place] [address]  Chief Complaint  Patient presents with  . Follow-up    HPI: 85 year old male who presents today for further evaluation of a left mid ureteral calculus.  He was seen and evaluated the emergency room earlier this week with severe left flank pain with associated nausea and vomiting on 02/14/2019.  CT scan revealed a left mid ureteral calculus which was read as a 4 mm stone, but when measured in the coronal plane, measures nearly 1 cm in length.  Hounsfield units 1400.  There is associated hydroureteronephrosis on CT scan.  Labs in the emergency room revealed red blood cells in his urine along with a few white blood cells.  No bacteria was seen.  There is no concern for infection.  Creatinine was elevated to 1.37.  Pain was able to be managed and was discharged home.  He has no personal history of kidney stones.  He continues have intermittent episodes of fairly significant left flank pain.  The pain has not migrated significantly.  He has no fevers or chills.  No dysuria or gross hematuria.    He does have multiple medical comorbidities including personal history of coronary artery disease on aspirin and Plavix.  He last took his Plavix yesterday.  He also has a history of severe pulmonary fibrosis and is on 15 L of oxygen currently including a nonrebreather in addition to his nasal cannula (normally 6 L continuous).  His wife indicates that he has had increased oxygen requirement which she relates to his flank pain.  He has been seen in the past for difficulty urinating.  His bladder was mildly distended on CT scan.  He has significant prostamegaly as well.   PMH: Past Medical History:  Diagnosis Date  . BPH (benign prostatic hyperplasia)   . Coronary artery disease    a. 11/2006 Cath: D1 99ost (2.5x15 Xience DES), D2 90ost (small);  b. 12/2017 PCI: LAD 95 (3.25x8 & 3.0x 33 Sierra DES'. D1 patent stent. D2 30ost. EF 55-65%;  c. 08/2017 Cath: LM min irregs, LAD 40ost, patent stents, D1 patent stent, LCX nl, OM1/2/3 nl, RCA 30ost, RPDA/RPAV/RPL1/2/3 nl.  . DOE (dyspnea on exertion)    a. Chronic supplemental O2 in setting of pulm fibrosis; b. 10/2017 Echo: Nl LV size/fxn. Mild Ao sclerosis. Trace to mild TR.  [name] GERD (gastroesophageal reflux disease)   . Hyperlipidemia   . Hypertension   . On home oxygen therapy    "2-3L; 24/7" (01/19/2017); "6L 24/7" (04/06/2018)  . OSA on CPAP   . Oxygen deficiency   . Pacemaker 04/07/2018  . Pneumonia 07/2016  . Pulmonary fibrosis (HCC) 08/26/2015  . Wears hearing aid    (sometimes)    Surgical History: Past Surgical History:  Procedure Laterality Date  . CATARACT EXTRACTION W/PHACO Right 01/29/2015   Procedure: CATARACT EXTRACTION PHACO AND INTRAOCULAR LENS PLACEMENT (IOC);  Surgeon: Lockie Mola, MD;  Location: [facility];  Service: Ophthalmology;  Laterality: Right;  RESTOR LENS CPAP  . CATARACT EXTRACTION W/PHACO Left 03/12/2015   Procedure: CATARACT EXTRACTION PHACO AND INTRAOCULAR LENS PLACEMENT (IOC);  Surgeon: Lockie Mola, MD;  Location: [facility];  Service: Ophthalmology;  Laterality: Left;  RESTOR SHUGARCAINE  . COLONOSCOPY WITH ESOPHAGOGASTRODUODENOSCOPY (EGD)    . CORONARY ANGIOPLASTY WITH STENT PLACEMENT  2010   "1 stent"  . CORONARY ANGIOPLASTY WITH STENT PLACEMENT  01/19/2017  .  CORONARY ATHERECTOMY N/A 01/19/2017   Procedure: CORONARY ATHERECTOMY - CSI;  Surgeon: Iran Ouch, MD;  Location: [facility];  Service: Cardiovascular;  Laterality: N/A;  unable to cross lesion  . CORONARY STENT INTERVENTION N/A 01/19/2017   Procedure: CORONARY STENT INTERVENTION;  Surgeon: Iran Ouch, MD;  Location: [facility]AS[facility] CV [facility];  Service: Cardiovascular;  Laterality: N/A;  . INGUINAL HERNIA REPAIR Bilateral   . LEFT HEART CATH AND  CORONARY ANGIOGRAPHY Left 09/12/2017   Procedure: LEFT HEART CATH AND CORONARY ANGIOGRAPHY;  Surgeon: Iran Ouch, MD;  Location: [facility] INVASIVE CV LAB;  Service: Cardiovascular;  Laterality: Left;  . PACEMAKER IMPLANT N/A 04/07/2018   Procedure: PACEMAKER IMPLANT;  Surgeon: Marinus Maw, MD;  Location: [facility] IN[facility] CV LAB;  Service: Cardiovascular;  Laterality: N/A;  . RIGHT/LEFT HEART CATH AND CORONARY ANGIOGRAPHY N/A 01/12/2017   Procedure: RIGHT/LEFT HEART CATH AND CORONARY ANGIOGRAPHY;  Surgeon: Iran Ouch, MD;  Location: [facility] CV [facility];  Service: Cardiovascular;  Laterality: N/A;    Home Medications:  Allergies as of 02/21/2019   No Known Allergies     Medication List       Accurate as of February 21, 2019 11:33 AM. If you have any questions, ask your nurse or doctor.        STOP taking these medications   budesonide 0.5 MG/2ML nebulizer solution Commonly known as: PULMICORT Stopped by: Vanna Scotland, MD   ciprofloxacin 500 MG tablet Commonly known as: CIPRO Stopped by: Vanna Scotland, MD     TAKE these medications   Accu-Chek FastClix Lancets Misc Use as directed twice day prn diag e11.65   aspirin 81 MG EC tablet Take 1 tablet (81 mg total) by mouth at bedtime.   Esbriet 267 MG Caps Generic drug: Pirfenidone Take 801 mg by mouth 3 (three) times daily.   finasteride 5 MG tablet Commonly known as: PROSCAR Take 1 tablet (5 mg total) by mouth daily.   furosemide 20 MG tablet Commonly known as: LASIX Take 1 tablet po QOD as directed What changed:   how much to take  how to take this  when to take this  reasons to take this  additional instructions   glucose blood test strip Commonly known as: Accu-Chek Guide Use as instructed twice a day prn diag e11.65   ipratropium-albuterol 0.5-2.5 (3) MG/3ML Soln Commonly known as: DUONEB Inhale into the lungs.   metoprolol tartrate 25 MG tablet Commonly known as: LOPRESSOR TAKE 1 TABLET BY  MOUTH TWO  TIMES DAILY What changed: how much to take   mirtazapine 30 MG tablet Commonly known as: REMERON Take 30 mg by mouth at bedtime.   mometasone-formoterol 100-5 MCG/ACT Aero Commonly known as: DULERA Inhale 2 puffs into the lungs 2 (two) times daily.   montelukast 10 MG tablet Commonly known as: SINGULAIR Take 1 tablet (10 mg total) by mouth at bedtime. What changed:   when to take this  reasons to take this   nitroGLYCERIN 0.4 MG SL tablet Commonly known as: Nitrostat Place 1 tablet (0.4 mg total) under the tongue every 5 (five) minutes as needed for chest pain.   ondansetron 4 MG tablet Commonly known as: ZOFRAN Take 1 tablet (4 mg total) by mouth every 8 (eight) hours as needed for up to 7 days for nausea or vomiting.   OXYGEN Inhale 6 L into the lungs continuous.   pantoprazole 40 MG tablet Commonly known as: PROTONIX TAKE 1 TABLET BY MOUTH  DAILY AS NEEDED FOR  HEARTBURN   rivastigmine 9.5 mg/24hr Commonly known as: EXELON Place 1 patch (9.5 mg total) onto the skin daily.   rosuvastatin 40 MG tablet Commonly known as: CRESTOR TAKE 1 TABLET BY MOUTH  EVERY DAY   tamsulosin 0.4 MG Caps capsule Commonly known as: FLOMAX Take 1 capsule (0.4 mg total) by mouth daily after supper for 7 days.   zaleplon 5 MG capsule Commonly known as: Sonata Take 1 tablet po QHS prn. May repeat dose one time prn   zolpidem 10 MG tablet Commonly known as: AMBIEN Take 1 tablet (10 mg total) by mouth at bedtime as needed.       Allergies: No Known Allergies  Family History: Family History  Problem Relation Age of Onset  . CAD Father   . CAD Brother     Social History:  reports that he has never smoked. He has never used smokeless tobacco. He reports that he does not drink alcohol or use drugs.  ROS: UROLOGY Frequent Urination?: No Hard to postpone urination?: No Burning/pain with urination?: No Get up at night to urinate?: No Leakage of urine?: No Urine  stream starts and stops?: No Trouble starting stream?: No Do you have to strain to urinate?: No Blood in urine?: No Urinary tract infection?: No Sexually transmitted disease?: No Injury to kidneys or bladder?: No Painful intercourse?: No Weak stream?: No Erection problems?: No Penile pain?: No  Gastrointestinal Nausea?: No Vomiting?: No Indigestion/heartburn?: No Diarrhea?: No Constipation?: No  Constitutional Fever: No Night sweats?: No Weight loss?: No Fatigue?: No  Skin Skin rash/lesions?: No Itching?: No  Eyes Blurred vision?: No Double vision?: No  Ears/Nose/Throat Sore throat?: No Sinus problems?: No  Hematologic/Lymphatic Swollen glands?: No Easy bruising?: No  Cardiovascular Leg swelling?: No Chest pain?: No  Respiratory Cough?: No Shortness of breath?: No  Endocrine Excessive thirst?: No  Musculoskeletal Back pain?: Yes Joint pain?: No  Neurological Headaches?: No Dizziness?: No  Psychologic Depression?: Yes Anxiety?: Yes  Physical Exam: BP 123/67   Pulse 81   Ht 5\' 10"  (1.778 m)   Wt 149 lb (67.6 kg)   BMI 21.38 kg/m   Constitutional:  Alert and oriented, No acute distress.  Accompanied by wife today. HEENT: [place] AT, moist mucus membranes.  Trachea midline, no masses. Cardiovascular: No clubbing, cyanosis, or edema. Respiratory: Normal respiratory effort, no increased work of breathing.  In wheelchair, wearing nasal cannula and nonrebreather mask. GU: Left flank pain to deep palpation. Skin: No rashes, bruises or suspicious lesions. Neurologic: Grossly intact, no focal deficits, moving all 4 extremities. Psychiatric: Normal mood and affect.  Laboratory Data: Lab Results  Component Value Date   WBC 9.2 02/14/2019   HGB 13.6 02/14/2019   HCT 41.8 02/14/2019   MCV 90.1 02/14/2019   PLT 164 02/14/2019    Lab Results  Component Value Date   CREATININE 1.37 (H) 02/14/2019   Urinalysis    Component Value Date/Time    COLORURINE YELLOW (A) 02/14/2019 2349   APPEARANCEUR HAZY (A) 02/14/2019 2349   APPEARANCEUR Clear 11/07/2018 1458   LABSPEC 1.027 02/14/2019 2349   PHURINE 5.0 02/14/2019 2349   GLUCOSEU NEGATIVE 02/14/2019 2349   HGBUR LARGE (A) 02/14/2019 2349   BILIRUBINUR NEGATIVE 02/14/2019 2349   BILIRUBINUR Negative 11/07/2018 1458   KETONESUR NEGATIVE 02/14/2019 2349   PROTEINUR NEGATIVE 02/14/2019 2349   NITRITE NEGATIVE 02/14/2019 2349   LEUKOCYTESUR NEGATIVE 02/14/2019 2349    Lab Results  Component Value Date  LABMICR See below: 11/07/2018   WBCUA 0-5 11/07/2018   LABEPIT 0-10 11/07/2018   BACTERIA NONE SEEN 02/14/2019    Pertinent Imaging: CLINICAL DATA:  Left lower quadrant abdominal pain for 3 days, constipation for 2 days  EXAM: CT ABDOMEN AND PELVIS WITH CONTRAST  TECHNIQUE: Multidetector CT imaging of the abdomen and pelvis was performed using the standard protocol following bolus administration of intravenous contrast.  CONTRAST:  119mL OMNIPAQUE IOHEXOL 300 MG/ML  SOLN  COMPARISON:  CT chest October 23, 2018  FINDINGS: Lower chest: Basilar areas of pulmonary fibrotic change. No consolidative opacity or evidence of superimposed pneumonia. Cardiac pacer wires are noted directed towards the apex and right atrium. Mild cardiomegaly. No pericardial effusion.  Hepatobiliary: No focal liver abnormality is seen. No gallstones, gallbladder wall thickening, or biliary dilatation.  Pancreas: Unremarkable. No pancreatic ductal dilatation or surrounding inflammatory changes.  Spleen: Normal in size without focal abnormality.  Adrenals/Urinary Tract: Normal adrenal glands. Left moderate hydroureteronephrosis to the level of a 4 mm calculus in the mid left ureter. No other visible obstructing urolithiasis. Additional punctate calcification layering in left lateral dependent bladder may reflect a recently passed stone. Fluid attenuation cyst arises from the upper  pole left kidney measuring 2 cm in maximal diameter. Additional subcentimeter hypoattenuating foci in both kidneys are too small to fully characterize. There is a slightly delayed left nephrogram and delayed left excretion, likely physiologic and related to obstruction.  Stomach/Bowel: Small hiatal hernia. Stomach and duodenal sweep are otherwise unremarkable. No small bowel dilatation or wall thickening. Some fecalized distal small bowel contents are noted. A normal appendix is visualized. Moderate stool burden within the right colon as well as inspissated fecal material in the rectal vault without perirectal stranding. No evidence of mechanical obstruction. No wall thickening dilatation of the colon.  Vascular/Lymphatic: Atherosclerotic plaque within the normal caliber aorta. No suspicious or enlarged lymph nodes in the included lymphatic chains.  Reproductive: Prostatomegaly with indentation of the posterior bladder base.  Other: Evidence of prior bilateral inguinal hernia repairs with some recurrent fat protrusion into the both inguinal canals. No free fluid or free air in the abdomen or pelvis.  Musculoskeletal: Multilevel degenerative changes are present in the imaged portions of the spine. Levocurvature of the lumbar spine with some mild compensatory dextrocurvature of the thoracolumbar junction. No acute osseous abnormality or suspicious osseous lesion.  IMPRESSION: 1. Left moderate hydroureteronephrosis to the level of a 4 mm calculus in the mid left ureter. Delayed left nephrogram is likely physiologic and related to this obstruction. 2. Additional punctate calcification layering in the left lateral dependent bladder may reflect a recently passed stone. 3. Moderate stool burden within the right colon as well as inspissated fecal material in the rectal vault, suggestive of constipation with possible fecal impaction. No evidence of mechanical obstruction. 4.  Prostatomegaly. 5. Bilateral inguinal hernia repairs with some recurrent fat herniation. 6. Basilar lung fibrosis. 7. Cardiomegaly. 8. Aortic Atherosclerosis (ICD10-I70.0).   Electronically Signed   By: Lovena Le M.D.   On: 02/15/2019 00:50  CT scan was personally reviewed today.  Left mid ureteral calculus is in fact a 10 mm x 4 mm and is relatively dense, approximately 1400 Hounsfield units.  Agree with all other above radiologic interpretations.  Assessment & Plan:    1. Left ureteral stone 10 mm left mid ureteral calculus with resulting hydroureteronephrosis  He continues to be symptomatic now going on for approximately 10 days.  His pain waxes and wanes.  No evidence of infection.  Given the size of stone, it is unlikely that he will pass spontaneously.  We discussed surgical options for the stone.  Unfortunately, he is not a good surgical candidate due to his severe pulmonary fibrosis and high oxygen requirement.  Significant concern about the ability to extubate the patient if he requires general anesthesia with ET tube.  Alternative including ESWL was also discussed which requires only conscious sedation, however, the stone is relatively dense and thus the efficacy rate is significantly lower.  Risk and benefits of each were discussed in detail.  Ultimately, he would like to try ESWL.  He has been taking aspirin Plavix and although the stone is slightly greater than 2 cm outside the renal shadow, there is still risk for perinephric hematoma.  As such, we will have him stop these medications with cardiac clearance and pursue ESWL next week.  We will have anesthesia available given that he is relatively high risk.  He understands that if this fails, will ultimately need to pursue some other form of intervention.  Risks of failure, need for further procedures, Steinstrasse, partial fragmentation, retained stone, bleeding, infection, damage surrounding structures were all  reviewed.  All questions answered.  In the interim, they will continue to manage his pain with narcotics as needed.  They do not need any refills at this time but will call if they need further medications.  Warning symptoms including severe uncontrolled pain as well as fevers or chills were discussed.  We will seek more urgent attention/evaluation should this happen.  2. Hydronephrosis, left Secondary to #1  3. AKI (acute kidney injury) (HCC) Secondary #1  4. Drug-induced constipation Incidental finding of fairly significant constipation on CT scan, advised to start bowel regimen possibly MiraLAX as needed   Vanna Scotland, MD  [facility] [address] [place], [place] [address] [phone]  I spent 40 min with this patient of which greater than 50% was spent in counseling and coordination of care with the patient.

## 2019-02-21 NOTE — Telephone Encounter (Signed)
   [facility] Pre-operative Risk Assessment    Request for surgical clearance:  1. What type of surgery is being performed? Left extracorporeal shockwave lithotripsy  2. When is this surgery scheduled? 03/27/19  3. What type of clearance is required (medical clearance vs. Pharmacy clearance to hold med vs. Both)? Both Are there any medications that need to be held prior to surgery and how long? Aspirin and Plavix 5 days prior to procedure 4. Practice name and name of physician performing surgery? [facility], Dr. Bernardo Heater  What is your office phone number [phone] 7.   What is your office fax number [phone]  8.   Anesthesia type (None, local, MAC, general) ? Not listed  Vincent Kennedy 02/21/2019, 3:14 PM  _________________________________________________________________   (provider comments below)

## 2019-02-21 NOTE — Telephone Encounter (Signed)
   Primary Cardiologist: Kathlyn Sacramento, MD  Chart reviewed as part of pre-operative protocol coverage. Patient was contacted 02/21/2019 in reference to pre-operative risk assessment for pending surgery as outlined below.  LINKEN MCGLOTHEN was last seen on 11/30/2018 by Dr. Fletcher Anon.  Since that day, Vincent Kennedy has done well from a cardiac standpoint. He is having no chest discomfort. He has advanced lung disease and is on 15L of oxygen. This is his biggest issue.   His Plavix had been discontinued but that patient went back to taking it on his own about a week ago. He was told to stop taking it today by his urologist in preparation for the lithotripsy.   He can hold aspirin for 5 days prior to the procedure and restart after.   Therefore, based on ACC/AHA guidelines, the patient would be at acceptable risk for the planned procedure without further cardiovascular testing.   I will route this recommendation to the requesting party via Epic fax function and remove from pre-op pool.  Please call with questions.  Daune Perch, NP 02/21/2019, 4:10 PM

## 2019-02-23 ENCOUNTER — Other Ambulatory Visit: Payer: Self-pay

## 2019-02-23 ENCOUNTER — Other Ambulatory Visit
Admission: RE | Admit: 2019-02-23 | Discharge: 2019-02-23 | Disposition: A | Discharge status: Home / Self Care | Payer: Medicare Other | Source: Ambulatory Visit | Attending: Urology | Admitting: Urology

## 2019-02-23 DIAGNOSIS — Z01812 Encounter for preprocedural laboratory examination: Secondary | ICD-10-CM | POA: Diagnosis not present

## 2019-02-23 LAB — URINALYSIS, ROUTINE W REFLEX MICROSCOPIC
Bilirubin Urine: NEGATIVE
Glucose, UA: NEGATIVE mg/dL
Ketones, ur: NEGATIVE mg/dL
Leukocytes,Ua: NEGATIVE
Nitrite: NEGATIVE
Protein, ur: NEGATIVE mg/dL
RBC / HPF: >50 RBC/hpf — ABNORMAL HIGH (ref 0–5)
Specific Gravity, Urine: 1.014 (ref 1.005–1.030)
pH: 5 (ref 5.0–8.0)

## 2019-02-23 NOTE — Patient Instructions (Signed)
Patient has ESWL papers provided by urology office. Patient and wife aware of need to stop aspirin and plavix as of today.  Patient restarted his Plavix on his own about a week ago. They state that they have stopped both already. Pacemaker form on chart from recent interrogation.  Patient has been having a harder time with his oxygenation since ureteral stone and pain.  Currently on 15-20 L of nonrebreather oxygen.

## 2019-02-25 LAB — URINE CULTURE

## 2019-02-26 ENCOUNTER — Other Ambulatory Visit
Admission: RE | Admit: 2019-02-26 | Discharge: 2019-02-26 | Disposition: A | Discharge status: Home / Self Care | Payer: Medicare Other | Source: Ambulatory Visit | Attending: Urology | Admitting: Urology

## 2019-02-26 ENCOUNTER — Other Ambulatory Visit: Payer: Self-pay

## 2019-02-26 DIAGNOSIS — Z955 Presence of coronary angioplasty implant and graft: Secondary | ICD-10-CM | POA: Diagnosis not present

## 2019-02-26 DIAGNOSIS — R0602 Shortness of breath: Secondary | ICD-10-CM | POA: Diagnosis not present

## 2019-02-26 DIAGNOSIS — N1330 Unspecified hydronephrosis: Secondary | ICD-10-CM | POA: Diagnosis not present

## 2019-02-26 DIAGNOSIS — G4700 Insomnia, unspecified: Secondary | ICD-10-CM | POA: Diagnosis not present

## 2019-02-26 DIAGNOSIS — I442 Atrioventricular block, complete: Secondary | ICD-10-CM | POA: Diagnosis not present

## 2019-02-26 DIAGNOSIS — R5381 Other malaise: Secondary | ICD-10-CM | POA: Diagnosis not present

## 2019-02-26 DIAGNOSIS — E785 Hyperlipidemia, unspecified: Secondary | ICD-10-CM | POA: Diagnosis not present

## 2019-02-26 DIAGNOSIS — K219 Gastro-esophageal reflux disease without esophagitis: Secondary | ICD-10-CM | POA: Diagnosis not present

## 2019-02-26 DIAGNOSIS — G309 Alzheimer's disease, unspecified: Secondary | ICD-10-CM | POA: Diagnosis not present

## 2019-02-26 DIAGNOSIS — N23 Unspecified renal colic: Secondary | ICD-10-CM | POA: Diagnosis not present

## 2019-02-26 DIAGNOSIS — Z8249 Family history of ischemic heart disease and other diseases of the circulatory system: Secondary | ICD-10-CM | POA: Diagnosis not present

## 2019-02-26 DIAGNOSIS — N179 Acute kidney failure, unspecified: Secondary | ICD-10-CM | POA: Diagnosis not present

## 2019-02-26 DIAGNOSIS — N201 Calculus of ureter: Secondary | ICD-10-CM | POA: Diagnosis not present

## 2019-02-26 DIAGNOSIS — J8410 Pulmonary fibrosis, unspecified: Secondary | ICD-10-CM | POA: Diagnosis not present

## 2019-02-26 DIAGNOSIS — F0390 Unspecified dementia without behavioral disturbance: Secondary | ICD-10-CM | POA: Diagnosis not present

## 2019-02-26 DIAGNOSIS — Z9981 Dependence on supplemental oxygen: Secondary | ICD-10-CM | POA: Diagnosis not present

## 2019-02-26 DIAGNOSIS — R230 Cyanosis: Secondary | ICD-10-CM | POA: Diagnosis not present

## 2019-02-26 DIAGNOSIS — E860 Dehydration: Secondary | ICD-10-CM | POA: Diagnosis not present

## 2019-02-26 DIAGNOSIS — I2510 Atherosclerotic heart disease of native coronary artery without angina pectoris: Secondary | ICD-10-CM | POA: Diagnosis not present

## 2019-02-26 DIAGNOSIS — Z974 Presence of external hearing-aid: Secondary | ICD-10-CM | POA: Diagnosis not present

## 2019-02-26 DIAGNOSIS — J9611 Chronic respiratory failure with hypoxia: Secondary | ICD-10-CM | POA: Diagnosis not present

## 2019-02-26 DIAGNOSIS — G4733 Obstructive sleep apnea (adult) (pediatric): Secondary | ICD-10-CM | POA: Diagnosis not present

## 2019-02-26 DIAGNOSIS — N132 Hydronephrosis with renal and ureteral calculous obstruction: Secondary | ICD-10-CM | POA: Diagnosis not present

## 2019-02-26 DIAGNOSIS — I10 Essential (primary) hypertension: Secondary | ICD-10-CM | POA: Diagnosis not present

## 2019-02-26 DIAGNOSIS — I959 Hypotension, unspecified: Secondary | ICD-10-CM | POA: Diagnosis not present

## 2019-02-26 DIAGNOSIS — J9621 Acute and chronic respiratory failure with hypoxia: Secondary | ICD-10-CM | POA: Diagnosis not present

## 2019-02-26 DIAGNOSIS — I469 Cardiac arrest, cause unspecified: Secondary | ICD-10-CM | POA: Diagnosis not present

## 2019-02-26 DIAGNOSIS — R0600 Dyspnea, unspecified: Secondary | ICD-10-CM | POA: Diagnosis not present

## 2019-02-27 ENCOUNTER — Emergency Department: Payer: Medicare Other

## 2019-02-27 ENCOUNTER — Encounter: Payer: Self-pay | Admitting: Emergency Medicine

## 2019-02-27 ENCOUNTER — Other Ambulatory Visit: Payer: Self-pay

## 2019-02-27 ENCOUNTER — Encounter: Payer: Self-pay | Admitting: Urology

## 2019-02-27 ENCOUNTER — Inpatient Hospital Stay
Admission: EM | Admit: 2019-02-27 | Discharge: 2019-03-23 | DRG: 693 | Disposition: E | Payer: Medicare Other | Attending: Internal Medicine | Admitting: Internal Medicine

## 2019-02-27 DIAGNOSIS — Z974 Presence of external hearing-aid: Secondary | ICD-10-CM

## 2019-02-27 DIAGNOSIS — I2510 Atherosclerotic heart disease of native coronary artery without angina pectoris: Secondary | ICD-10-CM | POA: Diagnosis present

## 2019-02-27 DIAGNOSIS — J8410 Pulmonary fibrosis, unspecified: Secondary | ICD-10-CM | POA: Diagnosis present

## 2019-02-27 DIAGNOSIS — I959 Hypotension, unspecified: Secondary | ICD-10-CM

## 2019-02-27 DIAGNOSIS — F0280 Dementia in other diseases classified elsewhere without behavioral disturbance: Secondary | ICD-10-CM | POA: Diagnosis present

## 2019-02-27 DIAGNOSIS — Z955 Presence of coronary angioplasty implant and graft: Secondary | ICD-10-CM

## 2019-02-27 DIAGNOSIS — Z8249 Family history of ischemic heart disease and other diseases of the circulatory system: Secondary | ICD-10-CM

## 2019-02-27 DIAGNOSIS — N400 Enlarged prostate without lower urinary tract symptoms: Secondary | ICD-10-CM | POA: Diagnosis present

## 2019-02-27 DIAGNOSIS — N200 Calculus of kidney: Secondary | ICD-10-CM | POA: Insufficient documentation

## 2019-02-27 DIAGNOSIS — J9611 Chronic respiratory failure with hypoxia: Secondary | ICD-10-CM | POA: Diagnosis present

## 2019-02-27 DIAGNOSIS — N132 Hydronephrosis with renal and ureteral calculous obstruction: Principal | ICD-10-CM | POA: Diagnosis present

## 2019-02-27 DIAGNOSIS — Z20828 Contact with and (suspected) exposure to other viral communicable diseases: Secondary | ICD-10-CM | POA: Diagnosis present

## 2019-02-27 DIAGNOSIS — Z9981 Dependence on supplemental oxygen: Secondary | ICD-10-CM

## 2019-02-27 DIAGNOSIS — J9621 Acute and chronic respiratory failure with hypoxia: Secondary | ICD-10-CM | POA: Diagnosis present

## 2019-02-27 DIAGNOSIS — R5381 Other malaise: Secondary | ICD-10-CM | POA: Diagnosis present

## 2019-02-27 DIAGNOSIS — I442 Atrioventricular block, complete: Secondary | ICD-10-CM | POA: Diagnosis present

## 2019-02-27 DIAGNOSIS — E785 Hyperlipidemia, unspecified: Secondary | ICD-10-CM | POA: Diagnosis present

## 2019-02-27 DIAGNOSIS — G4733 Obstructive sleep apnea (adult) (pediatric): Secondary | ICD-10-CM | POA: Diagnosis present

## 2019-02-27 DIAGNOSIS — F0390 Unspecified dementia without behavioral disturbance: Secondary | ICD-10-CM

## 2019-02-27 DIAGNOSIS — N23 Unspecified renal colic: Secondary | ICD-10-CM

## 2019-02-27 DIAGNOSIS — Z950 Presence of cardiac pacemaker: Secondary | ICD-10-CM

## 2019-02-27 DIAGNOSIS — Z9861 Coronary angioplasty status: Secondary | ICD-10-CM

## 2019-02-27 DIAGNOSIS — G4700 Insomnia, unspecified: Secondary | ICD-10-CM | POA: Diagnosis present

## 2019-02-27 DIAGNOSIS — I469 Cardiac arrest, cause unspecified: Secondary | ICD-10-CM | POA: Diagnosis not present

## 2019-02-27 DIAGNOSIS — E860 Dehydration: Secondary | ICD-10-CM | POA: Diagnosis present

## 2019-02-27 DIAGNOSIS — R230 Cyanosis: Secondary | ICD-10-CM | POA: Diagnosis not present

## 2019-02-27 DIAGNOSIS — N179 Acute kidney failure, unspecified: Secondary | ICD-10-CM | POA: Diagnosis present

## 2019-02-27 DIAGNOSIS — G309 Alzheimer's disease, unspecified: Secondary | ICD-10-CM | POA: Diagnosis present

## 2019-02-27 DIAGNOSIS — I10 Essential (primary) hypertension: Secondary | ICD-10-CM | POA: Diagnosis present

## 2019-02-27 DIAGNOSIS — K219 Gastro-esophageal reflux disease without esophagitis: Secondary | ICD-10-CM | POA: Diagnosis present

## 2019-02-27 DIAGNOSIS — N1330 Unspecified hydronephrosis: Secondary | ICD-10-CM | POA: Diagnosis present

## 2019-02-27 LAB — COMPREHENSIVE METABOLIC PANEL
ALT: 20 U/L (ref 0–44)
AST: 24 U/L (ref 15–41)
Albumin: 3.2 g/dL — ABNORMAL LOW (ref 3.5–5.0)
Alkaline Phosphatase: 73 U/L (ref 38–126)
Anion gap: 9 (ref 5–15)
BUN: 15 mg/dL (ref 8–23)
CO2: 25 mmol/L (ref 22–32)
Calcium: 8.5 mg/dL — ABNORMAL LOW (ref 8.9–10.3)
Chloride: 101 mmol/L (ref 98–111)
Creatinine, Ser: 1.31 mg/dL — ABNORMAL HIGH (ref 0.61–1.24)
GFR calc Af Amer: 58 mL/min — ABNORMAL LOW (ref >60)
GFR calc non Af Amer: 50 mL/min — ABNORMAL LOW (ref >60)
Glucose, Bld: 118 mg/dL — ABNORMAL HIGH (ref 70–99)
Potassium: 4.2 mmol/L (ref 3.5–5.1)
Sodium: 135 mmol/L (ref 135–145)
Total Bilirubin: 0.6 mg/dL (ref 0.3–1.2)
Total Protein: 8.2 g/dL — ABNORMAL HIGH (ref 6.5–8.1)

## 2019-02-27 LAB — CBC WITH DIFFERENTIAL/PLATELET
Abs Immature Granulocytes: 0.04 10*3/uL (ref 0.00–0.07)
Basophils Absolute: 0.1 10*3/uL (ref 0.0–0.1)
Basophils Relative: 1 %
Eosinophils Absolute: 0.1 10*3/uL (ref 0.0–0.5)
Eosinophils Relative: 1 %
HCT: 40.3 % (ref 39.0–52.0)
Hemoglobin: 13 g/dL (ref 13.0–17.0)
Immature Granulocytes: 0 %
Lymphocytes Relative: 9 %
Lymphs Abs: 0.8 10*3/uL (ref 0.7–4.0)
MCH: 29.2 pg (ref 26.0–34.0)
MCHC: 32.3 g/dL (ref 30.0–36.0)
MCV: 90.6 fL (ref 80.0–100.0)
Monocytes Absolute: 0.6 10*3/uL (ref 0.1–1.0)
Monocytes Relative: 6 %
Neutro Abs: 7.5 10*3/uL (ref 1.7–7.7)
Neutrophils Relative %: 83 %
Platelets: 194 10*3/uL (ref 150–400)
RBC: 4.45 MIL/uL (ref 4.22–5.81)
RDW: 13.4 % (ref 11.5–15.5)
WBC: 9.1 10*3/uL (ref 4.0–10.5)
nRBC: 0 % (ref 0.0–0.2)

## 2019-02-27 LAB — SARS CORONAVIRUS 2 (TAT 6-24 HRS): SARS Coronavirus 2: NEGATIVE

## 2019-02-27 MED ORDER — MIRTAZAPINE 15 MG PO TABS
30.0000 mg | ORAL_TABLET | Freq: Every day | ORAL | Status: DC
  Administered 2019-02-28: 20:00:00 30 mg via ORAL
  Filled 2019-02-27 (×2): qty 2

## 2019-02-27 MED ORDER — SODIUM CHLORIDE 0.9 % IV BOLUS
500.0000 mL | Freq: Once | INTRAVENOUS | Status: AC
  Administered 2019-02-27: 23:00:00 500 mL via INTRAVENOUS

## 2019-02-27 MED ORDER — KETOROLAC TROMETHAMINE 30 MG/ML IJ SOLN
15.0000 mg | Freq: Once | INTRAMUSCULAR | Status: AC
  Administered 2019-02-27: 21:00:00 15 mg via INTRAVENOUS
  Filled 2019-02-27: qty 1

## 2019-02-27 MED ORDER — IPRATROPIUM-ALBUTEROL 0.5-2.5 (3) MG/3ML IN SOLN
3.0000 mL | RESPIRATORY_TRACT | Status: DC | PRN
  Administered 2019-02-28: 18:00:00 3 mL via RESPIRATORY_TRACT
  Filled 2019-02-27: qty 3

## 2019-02-27 MED ORDER — RIVASTIGMINE 9.5 MG/24HR TD PT24
9.5000 mg | MEDICATED_PATCH | Freq: Every day | TRANSDERMAL | Status: DC
  Administered 2019-02-28: 9.5 mg via TRANSDERMAL
  Filled 2019-02-27 (×2): qty 1

## 2019-02-27 MED ORDER — MOMETASONE FURO-FORMOTEROL FUM 100-5 MCG/ACT IN AERO
2.0000 | INHALATION_SPRAY | Freq: Two times a day (BID) | RESPIRATORY_TRACT | Status: DC
  Administered 2019-02-28 (×2): 2 via RESPIRATORY_TRACT
  Filled 2019-02-27 (×2): qty 8.8

## 2019-02-27 MED ORDER — HYDROMORPHONE HCL 1 MG/ML IJ SOLN
0.5000 mg | Freq: Once | INTRAMUSCULAR | Status: AC
  Administered 2019-02-27: 20:00:00 0.5 mg via INTRAVENOUS
  Filled 2019-02-27: qty 1

## 2019-02-27 MED ORDER — ONDANSETRON HCL 4 MG/2ML IJ SOLN
4.0000 mg | Freq: Once | INTRAMUSCULAR | Status: AC
  Administered 2019-02-27: 20:00:00 4 mg via INTRAVENOUS
  Filled 2019-02-27: qty 2

## 2019-02-27 MED ORDER — PIRFENIDONE 267 MG PO CAPS: 801.0000 mg | ORAL_CAPSULE | Freq: Three times a day (TID) | ORAL | Status: DC

## 2019-02-27 MED ORDER — OXYCODONE-ACETAMINOPHEN 5-325 MG PO TABS
1.0000 | ORAL_TABLET | Freq: Four times a day (QID) | ORAL | Status: DC
  Administered 2019-02-28 (×3): 1 via ORAL
  Filled 2019-02-27 (×3): qty 1

## 2019-02-27 MED ORDER — MORPHINE SULFATE (PF) 2 MG/ML IV SOLN
1.0000 mg | INTRAVENOUS | Status: DC | PRN
  Administered 2019-02-28 – 2019-03-01 (×2): 1 mg via INTRAVENOUS
  Filled 2019-02-27 (×2): qty 1

## 2019-02-27 MED ORDER — ZOLPIDEM TARTRATE 5 MG PO TABS
5.0000 mg | ORAL_TABLET | Freq: Every evening | ORAL | Status: DC | PRN
  Administered 2019-02-28: 5 mg via ORAL
  Filled 2019-02-27: qty 1

## 2019-02-27 MED ORDER — PANTOPRAZOLE SODIUM 40 MG PO TBEC
40.0000 mg | DELAYED_RELEASE_TABLET | Freq: Every day | ORAL | Status: DC
  Administered 2019-02-28: 09:00:00 40 mg via ORAL
  Filled 2019-02-27: qty 1

## 2019-02-27 NOTE — ED Triage Notes (Signed)
Pt in via private vehicle on NRB mask. Family reports pt with kidney stone and is scheduled for Lithro on Thursday but his pain is worse and he is requireing more oxygen. Pt currently on 30l of oxygen.

## 2019-02-27 NOTE — ED Notes (Addendum)
Pt wife gave him a sandwich to eat prior to obtaining approval from MD. When eating and oxygen removed oxygen saturation dropped down to 74%. Pt given water to drink.

## 2019-02-27 NOTE — ED Triage Notes (Signed)
Pt family reports pt cannot have any blood thinners due to upcoming procedure.

## 2019-02-27 NOTE — H&P (Signed)
History and Physical    Vincent Kennedy:[id number] DOB: 06/14/1932 DOA: Mar 16, 2019  PCP: [facility]  Patient coming from: Home  I have personally briefly reviewed patient's old medical records in [facility]  Chief Complaint: worsening left flank pain, nausea and vomiting  HPI: Vincent Kennedy is a 85 y.o. male with medical history significant of dementia, CAD s/p stent, heart block s/p pacemaker, pulmonary fibrosis with chronic hypoxemic respiratory failure on 15 L NRB, OSA not on CPAP, and GERD who presents with worsening left flank pain, nausea and vomiting.  Patient recently seen in the ED on 11/25 and was found to have left-sided ureterolithiasis with mild AKI and was discharged after receiving a small fluid bolus with follow-up with urology. He saw urology outpatient on 12/2 with planned ESWL this week and was advised to hold his aspirin and plavix due to risk of perinephric hematoma.  However his left-sided flank pain worsened today to the point where he had nausea and vomiting.  Also had more oxygen desaturation with ambulation which wife attributes to his pain.  Wife states she went up to 7.5 mg of Vicodin with minimal help and decided to bring him into the ER for further pain management.  ED Course: He was afebrile and initially presented with hypertension up to 166 but was hypotensive during my evaluation down to 97/48.  He had received 0.5 mg of Dilaudid, IV Toradol in the ED prior to my evaluation.  He remains on his 15 L nonrebreather.  CBC showed no leukocytosis or anemia.  CMP showed glucose of 118, creatinine of 1.31 which is up from 0.89 about 10 months ago.  CT renal stone search showed moderate left hydroureteronephrosis to the level of a 5 mm calculus in the distal left ureter.  There is also punctate calcification in the left posterior lateral bladder near the left UPJ that may represent additional urinary tract calculus.  EDP discussed case  with urology who recommended Torodol and admission for pain management.   Review of Systems: Patient denies left flank pain but otherwise deferred to his wife for history and symptoms  Past Medical History:  Diagnosis Date  . BPH (benign prostatic hyperplasia)   . Coronary artery disease    a. 11/2006 Cath: D1 99ost (2.5x15 Xience DES), D2 90ost (small); b. 12/2017 PCI: LAD 95 (3.25x8 & 3.0x 33 Sierra DES'. D1 patent stent. D2 30ost. EF 55-65%;  c. 08/2017 Cath: LM min irregs, LAD 40ost, patent stents, D1 patent stent, LCX nl, OM1/2/3 nl, RCA 30ost, RPDA/RPAV/RPL1/2/3 nl.  . DOE (dyspnea on exertion)    a. Chronic supplemental O2 in setting of pulm fibrosis; b. 10/2017 Echo: Nl LV size/fxn. Mild Ao sclerosis. Trace to mild TR.  [name] GERD (gastroesophageal reflux disease)   . Hyperlipidemia   . Hypertension   . On home oxygen therapy    "2-3L; 24/7" (01/19/2017); "6L 24/7" (04/06/2018)  . OSA on CPAP   . Oxygen deficiency   . Pacemaker 04/07/2018  . Pneumonia 07/2016  . Pulmonary fibrosis (HCC) 08/26/2015  . Wears hearing aid    (sometimes)    Past Surgical History:  Procedure Laterality Date  . CATARACT EXTRACTION W/PHACO Right 01/29/2015   Procedure: CATARACT EXTRACTION PHACO AND INTRAOCULAR LENS PLACEMENT (IOC);  Surgeon: Lockie Mola, MD;  Location: [facility];  Service: Ophthalmology;  Laterality: Right;  RESTOR LENS CPAP  . CATARACT EXTRACTION W/PHACO Left 03/12/2015   Procedure: CATARACT EXTRACTION PHACO AND INTRAOCULAR LENS PLACEMENT (  IOC);  Surgeon: [name], MD;  [facility];  Service: Ophthalmology;  Laterality: Left;  RESTOR SHUGARCAINE  . COLONOSCOPY WITH ESOPHAGOGASTRODUODENOSCOPY (EGD)    . CORONARY ANGIOPLASTY WITH STENT PLACEMENT  2010   "1 stent"  . CORONARY ANGIOPLASTY WITH STENT PLACEMENT  01/19/2017  . CORONARY ATHERECTOMY N/A 01/19/2017   Procedure: CORONARY ATHERECTOMY - CSI;  Surgeon: [name], Muhammad A, MD;  Location: [facility]  INVASIVE CV LAB;  Service: Cardiovascular;  Laterality: N/A;  unable to cross lesion  . CORONARY STENT INTERVENTION N/A 01/19/2017   Procedure: CORONARY STENT INTERVENTION;  Surgeon: [name], Muhammad A, MD;  Location: [facility] INVASIVE CV LAB;  Service: Cardiovascular;  Laterality: N/A;  . INGUINAL HERNIA REPAIR Bilateral   . LEFT HEART CATH AND CORONARY ANGIOGRAPHY Left 09/12/2017   Procedure: LEFT HEART CATH AND CORONARY ANGIOGRAPHY;  Surgeon: [name], Muhammad A, MD;  Location: [facility] INVASIVE CV LAB;  Service: Cardiovascular;  Laterality: Left;  . PACEMAKER IMPLANT N/A 04/07/2018   Procedure: PACEMAKER IMPLANT;  Surgeon: Marinus Mawaylor, Gregg W, MD;  [facility];  Service: Cardiovascular;  Laterality: N/A;  . RIGHT/LEFT HEART CATH AND CORONARY ANGIOGRAPHY N/A 01/12/2017   Procedure: RIGHT/LEFT HEART CATH AND CORONARY ANGIOGRAPHY;  Surgeon: [name], Muhammad A, MD;  Location: [facility] INVASIVE CV LAB;  Service: Cardiovascular;  Laterality: N/A;     reports that he has never smoked. He has never used smokeless tobacco. He reports that he does not drink alcohol or use drugs.  No Known Allergies  Family History  Problem Relation Age of Onset  . CAD Father   . CAD Brother      Prior to Admission medications   Medication Sig Start Date End Date Taking? Authorizing Provider  ACCU-CHEK FASTCLIX LANCETS MISC Use as directed twice day prn diag e11.65 01/25/18  Yes Lyndon CodeKhan, Fozia M, MD  finasteride (PROSCAR) 5 MG tablet Take 1 tablet (5 mg total) by mouth daily. 06/08/18  Yes [name], Muhammad A, MD  glucose blood (ACCU-CHEK GUIDE) test strip Use as instructed twice a day prn diag e11.65 01/25/18  Yes Lyndon CodeKhan, Fozia M, MD  ipratropium-albuterol (DUONEB) 0.5-2.5 (3) MG/3ML SOLN Inhale 3 mLs into the lungs every 4 (four) hours as needed.  09/12/18  Yes [provider]  metoprolol tartrate (LOPRESSOR) 25 MG tablet TAKE 1 TABLET BY MOUTH TWO  TIMES DAILY Patient taking differently: Take 12.5 mg by mouth 2 (two)  times daily.  05/29/18  Yes [name], Muhammad A, MD  mirtazapine (REMERON) 30 MG tablet Take 30 mg by mouth at bedtime. 06/05/18  Yes [provider]  mometasone-formoterol (DULERA) 100-5 MCG/ACT AERO Inhale 2 puffs into the lungs 2 (two) times daily. 02/09/18  Yes Yevonne PaxKhan, Saadat A, MD  Multiple Vitamins-Minerals (MULTIVITAMIN WITH MINERALS) tablet Take 1 tablet by mouth daily. Organic vitamins   Yes [provider]  nitroGLYCERIN (NITROSTAT) 0.4 MG SL tablet Place 1 tablet (0.4 mg total) under the tongue every 5 (five) minutes as needed for chest pain. 01/20/17  Yes Bhagat, Bhavinkumar, PA  ondansetron (ZOFRAN) 4 MG tablet Take 1 tablet by mouth 3 (three) times daily as needed. 02/26/2019  Yes [provider]  oxyCODONE-acetaminophen (PERCOCET) 5-325 mg TABS tablet Take 1 tablet by mouth every 6 (six) hours as needed.    Yes [provider]  OXYGEN Inhale 15-20 L into the lungs continuous. With pain, his o2 needs increase   Yes [provider]  pantoprazole (PROTONIX) 40 MG tablet TAKE 1 TABLET BY MOUTH  DAILY AS NEEDED FOR  HEARTBURN 11/03/18  Yes Wellington Hampshire, MD  Pirfenidone (ESBRIET) 267 MG CAPS Take 801 mg by mouth 3 (three) times daily.    Yes [provider]  rivastigmine (EXELON) 9.5 mg/24hr Place 1 patch (9.5 mg total) onto the skin daily. 05/26/18  Yes Wellington Hampshire, MD  rosuvastatin (CRESTOR) 40 MG tablet TAKE 1 TABLET BY MOUTH  EVERY DAY 05/29/18  Yes Wellington Hampshire, MD  tamsulosin (FLOMAX) 0.4 MG CAPS capsule Take 0.4 mg by mouth daily.    Yes [provider]  zaleplon (SONATA) 5 MG capsule Take 1 tablet po QHS prn. May repeat dose one time prn 11/23/18  Yes Scarboro, Audie Clear, NP  zolpidem (AMBIEN) 10 MG tablet Take 1 tablet (10 mg total) by mouth at bedtime as needed. 11/30/18  Yes Wellington Hampshire, MD  aspirin EC 81 MG EC tablet Take 1 tablet (81 mg total) by mouth at bedtime. Patient not taking: Reported on 03/22/2019 04/08/18    Daune Perch, NP  furosemide (LASIX) 20 MG tablet Take 1 tablet po QOD as directed Patient not taking: Reported on 02/23/2019 08/18/18   Ronnell Freshwater, NP  montelukast (SINGULAIR) 10 MG tablet Take 1 tablet (10 mg total) by mouth at bedtime. Patient not taking: Reported on 02/23/2019 06/05/18   Lavera Guise, MD    Physical Exam: Vitals:   02/20/2019 2100 03/22/2019 2130 02/28/2019 2143 03/19/2019 2149  BP: 125/63 (!) 100/51    Pulse: 77 72 95 82  Resp:      Temp:      TempSrc:      SpO2: 96% 100% (!) 74% 91%  Weight:      Height:        Constitutional: NAD, calm, comfortable, laying asleep in bed Vitals:   03/10/2019 2100 03/13/2019 2130 03/18/2019 2143 03/22/2019 2149  BP: 125/63 (!) 100/51    Pulse: 77 72 95 82  Resp:      Temp:      TempSrc:      SpO2: 96% 100% (!) 74% 91%  Weight:      Height:       Eyes:  lids and conjunctivae normal ENMT: Mucous membranes are moist. Posterior pharynx clear of any exudate or lesions. Neck: normal, supple,  Respiratory: clear to auscultation bilaterally, no wheezing, no crackles. Normal respiratory effort on 15L NRB. No accessory muscle use.  Cardiovascular: Regular rate and rhythm, no murmurs / rubs / gallops. No extremity edema.  Abdomen: no tenderness, no CVA tenderness bilaterally. Bowel sounds positive.  Musculoskeletal: no clubbing / cyanosis. No joint deformity upper and lower extremities. Good ROM, no contractures. Normal muscle tone.  Skin: no rashes, lesions, ulcers. No induration Neurologic: CN 2-12 grossly intact. Sensation intact, DTR normal. Strength 5/5 in all 4.  Psychiatric: Normal judgment and insight. Alert and oriented x 3. Normal mood.     Labs on Admission: I have personally reviewed following labs and imaging studies  CBC: Recent Labs  Lab 03/09/2019 1929  WBC 9.1  NEUTROABS 7.5  HGB 13.0  HCT 40.3  MCV 90.6  PLT 962   Basic Metabolic Panel: Recent Labs  Lab 02/22/2019 2042  NA 135  K 4.2  CL 101  CO2 25   GLUCOSE 118*  BUN 15  CREATININE 1.31*  CALCIUM 8.5*   GFR: Estimated Creatinine Clearance: 37.7 mL/min (A) (by C-G formula based on SCr of 1.31 mg/dL (H)). Liver Function Tests: Recent Labs  Lab  03/05/19 2042  AST 24  ALT 20  ALKPHOS 73  BILITOT 0.6  PROT 8.2*  ALBUMIN 3.2*   No results for input(s): LIPASE, AMYLASE in the last 168 hours. No results for input(s): AMMONIA in the last 168 hours. Coagulation Profile: No results for input(s): INR, PROTIME in the last 168 hours. Cardiac Enzymes: No results for input(s): CKTOTAL, CKMB, CKMBINDEX, TROPONINI in the last 168 hours. BNP (last 3 results) No results for input(s): PROBNP in the last 8760 hours. HbA1C: No results for input(s): HGBA1C in the last 72 hours. CBG: No results for input(s): GLUCAP in the last 168 hours. Lipid Profile: No results for input(s): CHOL, HDL, LDLCALC, TRIG, CHOLHDL, LDLDIRECT in the last 72 hours. Thyroid Function Tests: No results for input(s): TSH, T4TOTAL, FREET4, T3FREE, THYROIDAB in the last 72 hours. Anemia Panel: No results for input(s): VITAMINB12, FOLATE, FERRITIN, TIBC, IRON, RETICCTPCT in the last 72 hours. Urine analysis:    Component Value Date/Time   COLORURINE YELLOW (A) 02/23/2019 1521   APPEARANCEUR CLEAR (A) 02/23/2019 1521   APPEARANCEUR Clear 11/07/2018 1458   LABSPEC 1.014 02/23/2019 1521   PHURINE 5.0 02/23/2019 1521   GLUCOSEU NEGATIVE 02/23/2019 1521   HGBUR LARGE (A) 02/23/2019 1521   BILIRUBINUR NEGATIVE 02/23/2019 1521   BILIRUBINUR Negative 11/07/2018 1458   KETONESUR NEGATIVE 02/23/2019 1521   PROTEINUR NEGATIVE 02/23/2019 1521   NITRITE NEGATIVE 02/23/2019 1521   LEUKOCYTESUR NEGATIVE 02/23/2019 1521    Radiological Exams on Admission: Dg Chest 2 View  Result Date: Mar 05, 2019 CLINICAL DATA:  Dyspnea. EXAM: CHEST - 2 VIEW COMPARISON:  Aug 10, 2018. FINDINGS: Stable cardiomediastinal silhouette. Left-sided pacemaker is unchanged in position. Stable  fibrotic changes are noted throughout both lungs. No definite acute abnormality is noted. No pneumothorax or pleural effusion is noted. Bony thorax is unremarkable. IMPRESSION: Stable fibrotic changes are noted throughout both lungs. No acute abnormality is noted. Electronically Signed   By: Lupita Raider M.D.   On: 2019/03/05 19:25   Dg Abdomen 1 View  Result Date: 03-05-2019 CLINICAL DATA:  Renal colic. EXAM: ABDOMEN - 1 VIEW COMPARISON:  None. FINDINGS: The bowel gas pattern is normal. No radio-opaque calculi or other significant radiographic abnormality are seen. Status post hernia repair. IMPRESSION: No evidence of nephrolithiasis. No evidence of bowel obstruction or ileus. Electronically Signed   By: Lupita Raider M.D.   On: 03/05/19 19:24   Ct Renal Stone Study  Result Date: March 05, 2019 CLINICAL DATA:  Flank pain, stone disease suspected EXAM: CT ABDOMEN AND PELVIS WITHOUT CONTRAST TECHNIQUE: Multidetector CT imaging of the abdomen and pelvis was performed following the standard protocol without IV contrast. COMPARISON:  CT abdomen pelvis 02/15/2019 FINDINGS: Lower chest: Extensive basilar areas of fibrosis. No superimposed consolidation. Cardiac pacer wires are partially imaged directed towards the apex and right atrium. Normal heart size. No pericardial effusion. Trace pericardial fluid is within normal limits. Hepatobiliary: No focal liver abnormality is seen. No gallstones, gallbladder wall thickening, or biliary dilatation. Pancreas: Unremarkable. No pancreatic ductal dilatation or surrounding inflammatory changes. Spleen: Stable splenic capsular calcifications, unchanged since 2018. No focal splenic lesions. Normal splenic size. Adrenals/Urinary Tract: Normal adrenal glands. Moderate left hydroureteronephrosis to the level of a 5 mm calculus in the distal left ureter demonstrating some distal migration from comparison exam. Stable fluid attenuation cyst in the upper pole left kidney. No  worrisome renal lesions. Minimal asymmetric left perinephric stranding. No right urolithiasis or hydronephrosis. Punctate calcification in the left posterolateral bladder near the  left UVJ may reflect an additional urinary tract calculus. Mild bladder wall thickening, similar to prior. Stomach/Bowel: Small hiatal hernia. Stomach and duodenal sweep are otherwise unremarkable. No small bowel dilatation or wall thickening. A normal appendix is visualized. No colonic dilatation or wall thickening. Mild nonspecific rectal wall thickening may be related to underdistention. No evidence of obstruction. Vascular/Lymphatic: Atherosclerotic plaque within the normal caliber aorta. Additional calcifications throughout the branch vessels. No aneurysm or ectasia. No suspicious or enlarged lymph nodes in the included lymphatic chains. Reproductive: Prostatomegaly with indentation of the posterior bladder base, similar to prior. Other: Prior bilateral inguinal hernia repairs with recurrent fat protrusion into the inguinal canals. No bowel containing hernias. Musculoskeletal: Levocurvature of the lumbar spine with multilevel degenerative changes throughout the imaged thoracolumbar levels. No worrisome lytic or sclerotic lesions. IMPRESSION: 1. Moderate left hydroureteronephrosis to the level of a 5 mm calculus in the distal left ureter demonstrating some distal migration from comparison exam. 2. Punctate calcification in the left posterolateral bladder near the left UVJ may reflect an additional urinary tract calculus within the valve or layering within the bladder lumen. 3. Prostatomegaly with indentation of the posterior bladder base, similar to prior. Mild nonspecific thickening of the bladder wall may be reflective of chronic outlet obstruction. Correlate with symptoms and consider urinalysis if there is concern for cystitis. 4. Mild nonspecific rectal wall thickening may be related to underdistention. 5. Basilar fibrotic changes  in the lungs. 6.  Aortic Atherosclerosis (ICD10-I70.0). Electronically Signed   By: Kreg Shropshire M.D.   On: 03/21/2019 20:06      Assessment/Plan Hypotension -likely due to dehydration and pain medication.  He is s/p IV Dilaudid. -will give 500 cc bolus -Caution with any additional IV opioids - hold metoprolol , proscar and flomax  Left hydroureteronephrosis in the setting of ureterolithiasis -scheduled Percocet q6hrs. PRN IV morphine 1mg  for any breakthrough pain  -Received IV Dilaudid and Toradol in the ED -Urology consulted  AKI in the setting of urolithiasis -Monitor closely -Avoid any nephrotoxic agents  Pulmonary fibrosis -Baseline requires 15 L nonrebreather.  Stable - continue bronchodilator -continue Pifenidone  CAD - Holding aspirin and plavix prior to ESWL  Dementia/insomnia -continue mirtazapine, Exelon, Ambien  GERD -continue PPI   DVT prophylaxis:SCD Code Status: Full Family Communication: Plan discussed with patient and wife at bedside.  All questions and concerns addressed. disposition Plan: Home with observation Consults called: Urology Admission status: Observation    Dari Carpenito T Delaynee Alred DO [facility]   If 7PM-7AM, please contact night-coverage [email or web address] Password [facility]  02/26/2019, 10:55 PM

## 2019-02-27 NOTE — ED Provider Notes (Signed)
[facility] Emergency Department Provider Note       Time seen: ----------------------------------------- 6:45 PM on Mar 29, 2019 -----------------------------------------   I have reviewed the triage vital signs and the nursing notes.  HISTORY   Chief Complaint Flank Pain    HPI Vincent Kennedy is a 85 y.o. male with a history of coronary artery disease, GERD, hyperlipidemia, hypertension, pulmonary fibrosis, renal colic who presents to the ED for severe left flank pain and increased oxygen requirement.  He is typically on nonrebreather oxygen all the time now for his pulmonary fibrosis.  The pain in his left flank has made his oxygen requirement increased according to his family.  He is scheduled for lithotripsy on Thursday.  Past Medical History:  Diagnosis Date  . BPH (benign prostatic hyperplasia)   . Coronary artery disease    a. 11/2006 Cath: D1 99ost (2.5x15 Xience DES), D2 90ost (small); b. 12/2017 PCI: LAD 95 (3.25x8 & 3.0x 33 Sierra DES'. D1 patent stent. D2 30ost. EF 55-65%;  c. 08/2017 Cath: LM min irregs, LAD 40ost, patent stents, D1 patent stent, LCX nl, OM1/2/3 nl, RCA 30ost, RPDA/RPAV/RPL1/2/3 nl.  . DOE (dyspnea on exertion)    a. Chronic supplemental O2 in setting of pulm fibrosis; b. 10/2017 Echo: Nl LV size/fxn. Mild Ao sclerosis. Trace to mild TR.  [name] GERD (gastroesophageal reflux disease)   . Hyperlipidemia   . Hypertension   . On home oxygen therapy    "2-3L; 24/7" (01/19/2017); "6L 24/7" (04/06/2018)  . OSA on CPAP   . Oxygen deficiency   . Pacemaker 04/07/2018  . Pneumonia 07/2016  . Pulmonary fibrosis (HCC) 08/26/2015  . Wears hearing aid    (sometimes)    Patient Active Problem List   Diagnosis Date Noted  . S/P placement of cardiac pacemaker 04/08/2018  . Complete heart block (HCC) 04/06/2018  . Effort angina (HCC) 01/19/2017  . CAD S/P percutaneous coronary angioplasty 11/25/2016  . Cataracts, bilateral 11/25/2016  .  Chronic hypoxemic respiratory failure (HCC) 11/25/2016  . GERD (gastroesophageal reflux disease) 11/25/2016  . Inguinal hernia 11/25/2016  . OSA on CPAP 11/25/2016  . Urinary retention   . Pneumonia 08/15/2016  . Need for vaccination for Strep pneumoniae 04/28/2016  . Incomplete emptying of bladder 09/10/2015  . Pulmonary fibrosis (HCC) 08/26/2015  . Medication monitoring encounter 08/26/2015  . Elevated antinuclear antibody (ANA) level 04/11/2015  . Dyspnea 03/18/2015  . Coronary artery disease involving native coronary artery of native heart without angina pectoris 10/16/2014  . Auditory disturbance 10/16/2014  . Hypertension goal BP (blood pressure) < 150/90 10/16/2014  . Alzheimer's dementia without behavioral disturbance (HCC) 10/16/2014  . Status post insertion of drug eluting coronary artery stent 10/16/2014  . Medicare annual wellness visit, subsequent 10/16/2014  . Coronary artery disease   . Hyperlipidemia     Past Surgical History:  Procedure Laterality Date  . CATARACT EXTRACTION W/PHACO Right 01/29/2015   Procedure: CATARACT EXTRACTION PHACO AND INTRAOCULAR LENS PLACEMENT (IOC);  Surgeon: Lockie Mola, MD;  Location: [facility];  Service: Ophthalmology;  Laterality: Right;  RESTOR LENS CPAP  . CATARACT EXTRACTION W/PHACO Left 03/12/2015   Procedure: CATARACT EXTRACTION PHACO AND INTRAOCULAR LENS PLACEMENT (IOC);  Surgeon: Lockie Mola, MD;  Location: [facility];  Service: Ophthalmology;  Laterality: Left;  RESTOR SHUGARCAINE  . COLONOSCOPY WITH ESOPHAGOGASTRODUODENOSCOPY (EGD)    . CORONARY ANGIOPLASTY WITH STENT PLACEMENT  2010   "1 stent"  . CORONARY ANGIOPLASTY WITH STENT PLACEMENT  01/19/2017  .  CORONARY ATHERECTOMY N/A 01/19/2017   Procedure: CORONARY ATHERECTOMY - CSI;  Surgeon: Wellington Hampshire, MD;  Location: [place] [facility];  Service: Cardiovascular;  Laterality: N/A;  unable to cross lesion  . CORONARY STENT INTERVENTION N/A  01/19/2017   Procedure: CORONARY STENT INTERVENTION;  Surgeon: Wellington Hampshire, MD;  Location: [place] [facility];  Service: Cardiovascular;  Laterality: N/A;  . INGUINAL HERNIA REPAIR Bilateral   . LEFT HEART CATH AND CORONARY ANGIOGRAPHY Left 09/12/2017   Procedure: LEFT HEART CATH AND CORONARY ANGIOGRAPHY;  Surgeon: Wellington Hampshire, MD;  Location: [place] [facility];  Service: Cardiovascular;  Laterality: Left;  . PACEMAKER IMPLANT N/A 04/07/2018   Procedure: PACEMAKER IMPLANT;  Surgeon: Evans Lance, MD;  Location: [place] [facility];  Service: Cardiovascular;  Laterality: N/A;  . RIGHT/LEFT HEART CATH AND CORONARY ANGIOGRAPHY N/A 01/12/2017   Procedure: RIGHT/LEFT HEART CATH AND CORONARY ANGIOGRAPHY;  Surgeon: Wellington Hampshire, MD;  Location: [place] [facility];  Service: Cardiovascular;  Laterality: N/A;    Allergies Patient has no known allergies.  Social History Social History   Tobacco Use  . Smoking status: Never Smoker  . Smokeless tobacco: Never Used  Substance Use Topics  . Alcohol use: No    Alcohol/week: 0.0 standard drinks  . Drug use: No   Review of Systems Constitutional: Negative for fever. Cardiovascular: Negative for chest pain. Respiratory: Positive for shortness of breath Gastrointestinal: Positive for flank pain, vomiting Musculoskeletal: Negative for back pain. Skin: Negative for rash. Neurological: Negative for headaches, focal weakness or numbness.  All systems negative/normal/unremarkable except as stated in the HPI  ____________________________________________   PHYSICAL EXAM:  VITAL SIGNS: ED Triage Vitals [03/12/2019 1844]  Enc Vitals Group     BP      Pulse      Resp      Temp      Temp src      SpO2      Weight 140 lb (63.5 kg)     Height 5\' 8"  (1.727 m)     Head Circumference      Peak Flow      Pain Score 10     Pain Loc      Pain Edu?      Excl. in [place]?    Constitutional: Alert and oriented.  Chronically  ill-appearing, mild distress.  Patient extremely dyspneic with exertion Eyes: Conjunctivae are normal. Normal extraocular movements. Cardiovascular: Normal rate, regular rhythm. No murmurs, rubs, or gallops. Respiratory: Crackles bilaterally, no obvious tachypnea Gastrointestinal: Left flank tenderness, normal bowel sounds. Musculoskeletal: Nontender with normal range of motion in extremities.  Neurologic:  No gross focal neurologic deficits are appreciated.  Skin:  Skin is warm, dry and intact.  Psychiatric: Mood and affect are normal.  ____________________________________________  ED COURSE:  As part of my medical decision making, I reviewed the following data within the [place] History obtained from family if available, nursing notes, old chart and ekg, as well as notes from prior ED visits. Patient presented for renal colic and hypoxia, we will assess with labs and imaging as indicated at this time.   Procedures  VANNA SHAVERS was evaluated in Emergency Department on 03/17/2019 for the symptoms described in the history of present illness. He was evaluated in the context of the global COVID-19 pandemic, which necessitated consideration that the patient might be at risk for infection with the SARS-CoV-2 virus that causes COVID-19. Institutional protocols and algorithms  that pertain to the evaluation of patients at risk for COVID-19 are in a state of rapid change based on information released by regulatory bodies including the CDC and federal and state organizations. These policies and algorithms were followed during the patient's care in the ED.  ____________________________________________   LABS (pertinent positives/negatives)  Labs Reviewed  COMPREHENSIVE METABOLIC PANEL - Abnormal; Notable for the following components:      Result Value   Glucose, Bld 118 (*)    Creatinine, Ser 1.31 (*)    Calcium 8.5 (*)    Total Protein 8.2 (*)    Albumin 3.2 (*)    GFR  calc non Af Amer 50 (*)    GFR calc Af Amer 58 (*)    All other components within normal limits  CBC WITH DIFFERENTIAL/PLATELET  URINALYSIS, COMPLETE (UACMP) WITH MICROSCOPIC    RADIOLOGY Images were viewed by me  Chest x-ray, KUB IMPRESSION:  1. Moderate left hydroureteronephrosis to the level of a 5 mm  calculus in the distal left ureter demonstrating some distal  migration from comparison exam.  2. Punctate calcification in the left posterolateral bladder near  the left UVJ may reflect an additional urinary tract calculus within  the valve or layering within the bladder lumen.  3. Prostatomegaly with indentation of the posterior bladder base,  similar to prior. Mild nonspecific thickening of the bladder wall  may be reflective of chronic outlet obstruction. Correlate with  symptoms and consider urinalysis if there is concern for cystitis.  4. Mild nonspecific rectal wall thickening may be related to  underdistention.  5. Basilar fibrotic changes in the lungs.  6. Aortic Atherosclerosis (ICD10-I70.0).   IMPRESSION:  No evidence of nephrolithiasis. No evidence of bowel obstruction or  ileus.  ____________________________________________   DIFFERENTIAL DIAGNOSIS   Renal colic, UTI, pyelonephritis, sepsis, pulmonary fibrosis, pneumonia, COVID-19 unlikely  FINAL ASSESSMENT AND PLAN  Renal colic, pulmonary fibrosis   Plan: The patient had presented for worsening oxygen requirement and worsening flank pain. Patient's labs did not reveal any acute process. Patient's imaging did reveal some progression of his kidney stone to the distal ureter.  I discussed with urology who states that he is a candidate for Toradol at this time.  I am discussing with the family as to whether or not they want to be admitted for pain control.  Patient family would prefer admission due to his intermittent severe pain and debility.   Ulice DashJohnathan E Collin Rengel, MD    Note: This note was generated in  part or whole with voice recognition software. Voice recognition is usually quite accurate but there are transcription errors that can and very often do occur. I apologize for any typographical errors that were not detected and corrected.     Emily FilbertWilliams, Shabnam Ladd E, MD 02/25/2019 2202

## 2019-02-28 ENCOUNTER — Encounter: Payer: Self-pay | Admitting: Registered Nurse

## 2019-02-28 ENCOUNTER — Encounter: Payer: Self-pay | Admitting: Certified Registered Nurse Anesthetist

## 2019-02-28 DIAGNOSIS — R5381 Other malaise: Secondary | ICD-10-CM | POA: Diagnosis present

## 2019-02-28 DIAGNOSIS — G4700 Insomnia, unspecified: Secondary | ICD-10-CM | POA: Diagnosis present

## 2019-02-28 DIAGNOSIS — Z8249 Family history of ischemic heart disease and other diseases of the circulatory system: Secondary | ICD-10-CM | POA: Diagnosis not present

## 2019-02-28 DIAGNOSIS — N201 Calculus of ureter: Secondary | ICD-10-CM

## 2019-02-28 DIAGNOSIS — J9611 Chronic respiratory failure with hypoxia: Secondary | ICD-10-CM

## 2019-02-28 DIAGNOSIS — G309 Alzheimer's disease, unspecified: Secondary | ICD-10-CM | POA: Diagnosis present

## 2019-02-28 DIAGNOSIS — N1330 Unspecified hydronephrosis: Secondary | ICD-10-CM | POA: Diagnosis present

## 2019-02-28 DIAGNOSIS — I959 Hypotension, unspecified: Secondary | ICD-10-CM | POA: Diagnosis present

## 2019-02-28 DIAGNOSIS — Z974 Presence of external hearing-aid: Secondary | ICD-10-CM | POA: Diagnosis not present

## 2019-02-28 DIAGNOSIS — J8410 Pulmonary fibrosis, unspecified: Secondary | ICD-10-CM | POA: Diagnosis present

## 2019-02-28 DIAGNOSIS — I10 Essential (primary) hypertension: Secondary | ICD-10-CM | POA: Diagnosis present

## 2019-02-28 DIAGNOSIS — N179 Acute kidney failure, unspecified: Secondary | ICD-10-CM | POA: Diagnosis present

## 2019-02-28 DIAGNOSIS — F0280 Dementia in other diseases classified elsewhere without behavioral disturbance: Secondary | ICD-10-CM | POA: Diagnosis present

## 2019-02-28 DIAGNOSIS — Z20828 Contact with and (suspected) exposure to other viral communicable diseases: Secondary | ICD-10-CM | POA: Diagnosis present

## 2019-02-28 DIAGNOSIS — I469 Cardiac arrest, cause unspecified: Secondary | ICD-10-CM | POA: Diagnosis not present

## 2019-02-28 DIAGNOSIS — N23 Unspecified renal colic: Secondary | ICD-10-CM | POA: Diagnosis not present

## 2019-02-28 DIAGNOSIS — I2510 Atherosclerotic heart disease of native coronary artery without angina pectoris: Secondary | ICD-10-CM | POA: Diagnosis present

## 2019-02-28 DIAGNOSIS — E860 Dehydration: Secondary | ICD-10-CM | POA: Diagnosis present

## 2019-02-28 DIAGNOSIS — K219 Gastro-esophageal reflux disease without esophagitis: Secondary | ICD-10-CM | POA: Diagnosis present

## 2019-02-28 DIAGNOSIS — I442 Atrioventricular block, complete: Secondary | ICD-10-CM | POA: Diagnosis present

## 2019-02-28 DIAGNOSIS — J9621 Acute and chronic respiratory failure with hypoxia: Secondary | ICD-10-CM | POA: Diagnosis present

## 2019-02-28 DIAGNOSIS — R230 Cyanosis: Secondary | ICD-10-CM | POA: Diagnosis not present

## 2019-02-28 DIAGNOSIS — G4733 Obstructive sleep apnea (adult) (pediatric): Secondary | ICD-10-CM | POA: Diagnosis present

## 2019-02-28 DIAGNOSIS — N132 Hydronephrosis with renal and ureteral calculous obstruction: Secondary | ICD-10-CM | POA: Diagnosis present

## 2019-02-28 DIAGNOSIS — Z955 Presence of coronary angioplasty implant and graft: Secondary | ICD-10-CM | POA: Diagnosis not present

## 2019-02-28 DIAGNOSIS — N400 Enlarged prostate without lower urinary tract symptoms: Secondary | ICD-10-CM | POA: Diagnosis present

## 2019-02-28 DIAGNOSIS — E785 Hyperlipidemia, unspecified: Secondary | ICD-10-CM | POA: Diagnosis present

## 2019-02-28 DIAGNOSIS — Z9981 Dependence on supplemental oxygen: Secondary | ICD-10-CM | POA: Diagnosis not present

## 2019-02-28 LAB — BASIC METABOLIC PANEL
Anion gap: 8 (ref 5–15)
BUN: 15 mg/dL (ref 8–23)
CO2: 25 mmol/L (ref 22–32)
Calcium: 8.2 mg/dL — ABNORMAL LOW (ref 8.9–10.3)
Chloride: 105 mmol/L (ref 98–111)
Creatinine, Ser: 1.31 mg/dL — ABNORMAL HIGH (ref 0.61–1.24)
GFR calc Af Amer: 58 mL/min — ABNORMAL LOW (ref >60)
GFR calc non Af Amer: 50 mL/min — ABNORMAL LOW (ref >60)
Glucose, Bld: 90 mg/dL (ref 70–99)
Potassium: 4.4 mmol/L (ref 3.5–5.1)
Sodium: 138 mmol/L (ref 135–145)

## 2019-02-28 LAB — URINALYSIS, COMPLETE (UACMP) WITH MICROSCOPIC
Bacteria, UA: NONE SEEN
Bilirubin Urine: NEGATIVE
Glucose, UA: NEGATIVE mg/dL
Hgb urine dipstick: NEGATIVE
Ketones, ur: NEGATIVE mg/dL
Leukocytes,Ua: NEGATIVE
Nitrite: NEGATIVE
Protein, ur: NEGATIVE mg/dL
Specific Gravity, Urine: 1.016 (ref 1.005–1.030)
pH: 5 (ref 5.0–8.0)

## 2019-02-28 LAB — CBC
HCT: 35.4 % — ABNORMAL LOW (ref 39.0–52.0)
Hemoglobin: 11.8 g/dL — ABNORMAL LOW (ref 13.0–17.0)
MCH: 29.4 pg (ref 26.0–34.0)
MCHC: 33.3 g/dL (ref 30.0–36.0)
MCV: 88.1 fL (ref 80.0–100.0)
Platelets: 167 10*3/uL (ref 150–400)
RBC: 4.02 MIL/uL — ABNORMAL LOW (ref 4.22–5.81)
RDW: 13.4 % (ref 11.5–15.5)
WBC: 8.1 10*3/uL (ref 4.0–10.5)
nRBC: 0 % (ref 0.0–0.2)

## 2019-02-28 MED ORDER — FUROSEMIDE 10 MG/ML IJ SOLN
20.0000 mg | Freq: Once | INTRAMUSCULAR | Status: AC
  Administered 2019-02-28: 20 mg via INTRAVENOUS
  Filled 2019-02-28: qty 4

## 2019-02-28 MED ORDER — SODIUM CHLORIDE 0.9 % IV SOLN
1.0000 g | INTRAVENOUS | Status: DC
  Filled 2019-02-28: qty 10

## 2019-02-28 NOTE — Consult Note (Signed)
Caron Tardif is an 85 y.o. retired Education officer, environmental followed by Dr. Erlene Quan for a 5 x 10 mm left ureteral calculus with significant symptoms.  Complicating factor is pulmonary fibrosis with chronic hypoxemic respiratory failure on 15 L nonrebreather.  He had been on 6 L oxygen and his oxygen demand has increased with his recent bout of renal colic.  He was scheduled for shockwave lithotripsy tomorrow however was admitted last night for intractable renal colic.  I had a discussion with Dr. Andree Elk of anesthesia today regarding sedation on the mobile lithotripsy unit.  He indicated with a 15 L nonrebreather IV sedation would not be possible due to respiratory depression and the likely need for intubation.  He felt a better option would be an attempt at ureteroscopic removal with a low spinal/saddle block which would hopefully avoid the need for sedation and intubation.  He is at high risk of not being able to extubate should he require intubation.  His stone has migrated to the distal ureter and a low spinal will hopefully be adequate.  I also discussed with his pulmonologist Dr. Devona Konig who also felt this would be a better option regarding his respiratory status.  Ureteroscopic removal would also have a higher success rate and a stent will also be placed which should resolve his renal colic as lithotripsy would require passage of fragments.  His stone is also difficult to visualize on plain film.  I discussed with [name] and his wife and they agree to proceed with ureteroscopy in the morning.  The procedure was discussed in detail including potential risks of bleeding, infection, ureteral injury and inability to remove the stone due to anatomy.  The need for a ureteral stent was also addressed.   John Giovanni, MD

## 2019-02-28 NOTE — OR Nursing (Signed)
Patient scheduled for Lithotripsy tomorrow. Dr Andree Elk and Dr Bernardo Heater aware of patient admission yesterday,  O2 requirement and sats.   Dr Bernardo Heater notified to contact Dr Andree Elk to discuss plan of care.

## 2019-02-28 NOTE — Progress Notes (Addendum)
PROGRESS NOTE                                                                                                                                                                                                             Patient Demographics:    Vincent Kennedy, is a 85 y.o. male, DOB - 09/04/1932, YNW:[id number] CradleRN:[id number]  Admit date - 03/14/2019   Admitting Physician Anselm Junglinghing T Tu, DO  Outpatient Primary MD for the patient is Jenne PaneLlc, [facility]  LOS - 0  Outpatient Specialists: Dr. Apolinar JunesBrandon (urology)  Chief Complaint  Patient presents with  . Flank Pain       Brief Narrative 85 year old male with history of CAD s/p stenting, heart block s/p pacemaker, pulmonary fibrosis with chronic hypoxemic respiratory failure on 15 L NRB at home, OSA not on CPAP, dementia, and GERD presented with worsening left flank pain associated with nausea and vomiting.  He was seen in the ED 10 days back with left-sided urolithiasis and mild AKI, given IV fluids and discharged home with urology follow-up.  He was seen by urologist Dr. Apolinar JunesBrandon on 12/2 with plan on ESWL this week and was advised to hold his aspirin and Plavix due to risk of perinephric hematoma.  Patient was cleared by cardiology for the procedure. Patient on the day of admission started having left-sided flank pain associated with nausea and vomiting.  He also reportedly had oxygen desaturation with ambulation.  His wife increased his Vicodin dose to 7.5 mg without much relief and brought to the ED. In the ED he was hypotensive with blood pressure of 97/48 mmHg, given IV fluids, IV Dilaudid and IV Toradol.  He was afebrile and no leukocytosis.  Had mild AKI with creatinine 1.31. CT renal stone study showed moderate left hydronephrosis with 5 mm calculus in the distal left ureter.  Admitted for further pain management and urology consult.   Subjective:   Patient seen and examined.  He was  found to desaturate in the 70s when trying to get up but asymptomatic at rest on 15 L and maintaining O2 sat greater than 92%.  Denies any worsening dyspnea or chest discomfort.  Assessment  & Plan :   Principal problem Obstructive urolithiasis with left-sided hydronephrosis Plan for lithotripsy this week as outpatient but presented with acute left flank pain.  As needed IV  morphine and Percocet.  Pain currently controlled.  Spoke with urology consult.  Plan for lithotripsy tomorrow.  N.p.o. after midnight. Aspirin and Plavix on hold.  Was cleared for surgery by cardiology recently.   Active Problems:   Coronary artery disease involving native coronary artery of native heart without angina pectoris Aspirin and Plavix on hold for surgery.    Acute on chronic hypoxemic respiratory failure (HCC) Chronic pulmonary fibrosis Sats stable on 15 L at rest.  Notably dropped in the 70s when trying to get out of bed.  Patient did receive some IV fluids yesterday in the ED.  Will give a dose of IV Lasix and monitor.  Continue as needed nebs.    AKI (acute kidney injury) (HCC) Mild likely associated with poor p.o. intake and hydronephrosis.  Received IV fluids in the ED.  Will hold further fluids given oxygen desaturation and ordered a dose of Lasix.  Monitor lab in a.m.  Dementia, mild to moderate Continue Exelon patch and Remeron.  Patient status: Inpatient Patient presenting with recurrent left flank pain with hydroureteronephrosis, acute kidney injury and persistent chronic respiratory failure.  He is at high risk of further worsening of his hydroureteronephrosis requiring inpatient surgical intervention, monitoring of his acute kidney injury and tenuous respiratory status.  For this he needs to be monitored in an inpatient setting for at least >2 midnight  Code Status : Full code  Family Communication  : None at bedside  Disposition Plan  : Pending hospital course, further urological  evaluation.  Barriers For Discharge : Active symptoms  Consults  : Neurology  Procedures  : CT renal study  DVT Prophylaxis  :  Lovenox -   Lab Results  Component Value Date   PLT 167 02/28/2019    Antibiotics  :   Anti-infectives (From admission, onward)   None        Objective:   Vitals:   03/10/19 2330 02/28/19 0000 02/28/19 0121 02/28/19 0851  BP: 91/60 (!) 107/54 (!) 104/50 129/68  Pulse: 72 89 70 80  Resp:   20 17  Temp:   98 F (36.7 C) 98.2 F (36.8 C)  TempSrc:   Axillary   SpO2: 94% 94% 92% 100%  Weight:   69.9 kg   Height:        Wt Readings from Last 3 Encounters:  02/28/19 69.9 kg  02/21/19 67.6 kg  11/30/18 67.9 kg     Intake/Output Summary (Last 24 hours) at 02/28/2019 1311 Last data filed at 02/28/2019 0915 Gross per 24 hour  Intake -  Output 25 ml  Net -25 ml     Physical Exam  Gen: not in distress HEENT: Moist mucosa, supple neck, on 15 L via facemask. Chest: Coarse crackles bilaterally CVS: N S1&S2, no murmurs,  GI: soft, NT, ND, bowel sounds present, no CVA tenderness bilaterally Musculoskeletal: warm, no edema     Data Review:    CBC Recent Labs  Lab 03/10/19 1929 02/28/19 0641  WBC 9.1 8.1  HGB 13.0 11.8*  HCT 40.3 35.4*  PLT 194 167  MCV 90.6 88.1  MCH 29.2 29.4  MCHC 32.3 33.3  RDW 13.4 13.4  LYMPHSABS 0.8  --   MONOABS 0.6  --   EOSABS 0.1  --   BASOSABS 0.1  --     Chemistries  Recent Labs  Lab Mar 10, 2019 2042 02/28/19 0641  NA 135 138  K 4.2 4.4  CL 101 105  CO2 25 25  GLUCOSE  118* 90  BUN 15 15  CREATININE 1.31* 1.31*  CALCIUM 8.5* 8.2*  AST 24  --   ALT 20  --   ALKPHOS 73  --   BILITOT 0.6  --    ------------------------------------------------------------------------------------------------------------------ No results for input(s): CHOL, HDL, LDLCALC, TRIG, CHOLHDL, LDLDIRECT in the last 72 hours.  No results found for:  HGBA1C ------------------------------------------------------------------------------------------------------------------ No results for input(s): TSH, T4TOTAL, T3FREE, THYROIDAB in the last 72 hours.  Invalid input(s): FREET3 ------------------------------------------------------------------------------------------------------------------ No results for input(s): VITAMINB12, FOLATE, FERRITIN, TIBC, IRON, RETICCTPCT in the last 72 hours.  Coagulation profile No results for input(s): INR, PROTIME in the last 168 hours.  No results for input(s): DDIMER in the last 72 hours.  Cardiac Enzymes No results for input(s): CKMB, TROPONINI, MYOGLOBIN in the last 168 hours.  Invalid input(s): CK ------------------------------------------------------------------------------------------------------------------    Component Value Date/Time   BNP 98.0 08/15/2016 1455    Inpatient Medications  Scheduled Meds: . mirtazapine  30 mg Oral QHS  . mometasone-formoterol  2 puff Inhalation BID  . oxyCODONE-acetaminophen  1 tablet Oral Q6H  . pantoprazole  40 mg Oral Daily  . Pirfenidone  801 mg Oral TID  . rivastigmine  9.5 mg Transdermal Daily   Continuous Infusions: PRN Meds:.ipratropium-albuterol, morphine injection, zolpidem  Micro Results Recent Results (from the past 240 hour(s))  Urine Culture (for PAT)     Status: Abnormal   Collection Time: 02/23/19  3:21 PM   Specimen: Urine, Random  Result Value Ref Range Status   Specimen Description   Final    URINE, RANDOM Performed at [facility], [address] [place], [place] [address]    Special Requests   Final    NONE Performed at [facility], [address] [place], [place] [address]    Culture MULTIPLE SPECIES PRESENT, SUGGEST RECOLLECTION (A)  Final   Report Status 02/25/2019 FINAL  Final  SARS CORONAVIRUS 2 (TAT 6-24 HRS) Nasopharyngeal Nasopharyngeal Swab     Status: None   Collection Time: 02/26/19 12:03  PM   Specimen: Nasopharyngeal Swab  Result Value Ref Range Status   SARS Coronavirus 2 NEGATIVE NEGATIVE Final    Comment: (NOTE) SARS-CoV-2 target nucleic acids are NOT DETECTED. The SARS-CoV-2 RNA is generally detectable in upper and lower respiratory specimens during the acute phase of infection. Negative results do not preclude SARS-CoV-2 infection, do not rule out co-infections with other pathogens, and should not be used as the sole basis for treatment or other patient management decisions. Negative results must be combined with clinical observations, patient history, and epidemiological information. The expected result is Negative. Fact Sheet for Patients: SugarRoll.be Fact Sheet for Healthcare Providers: https://www.woods-mathews.com/ This test is not yet approved or cleared by the [place] FDA and  has been authorized for detection and/or diagnosis of SARS-CoV-2 by FDA under an Emergency Use Authorization (EUA). This EUA will remain  in effect (meaning this test can be used) for the duration of the COVID-19 declaration under Section 56 4(b)(1) of the Act, 21 U.S.C. section 360bbb-3(b)(1), unless the authorization is terminated or revoked sooner. Performed at [facility], [place] [address], [place], [place] [address]     Radiology Reports Dg Chest 2 View  Result Date: 03/20/2019 CLINICAL DATA:  Dyspnea. EXAM: CHEST - 2 VIEW COMPARISON:  Aug 10, 2018. FINDINGS: Stable cardiomediastinal silhouette. Left-sided pacemaker is unchanged in position. Stable fibrotic changes are noted throughout both lungs. No definite acute abnormality is noted. No pneumothorax or pleural effusion  is noted. Bony thorax is unremarkable. IMPRESSION: Stable fibrotic changes are noted throughout both lungs. No acute abnormality is noted. Electronically Signed   By: Lupita Raider M.D.   On: March 19, 2019 19:25   Dg Abdomen 1 View  Result Date:  19-Mar-2019 CLINICAL DATA:  Renal colic. EXAM: ABDOMEN - 1 VIEW COMPARISON:  None. FINDINGS: The bowel gas pattern is normal. No radio-opaque calculi or other significant radiographic abnormality are seen. Status post hernia repair. IMPRESSION: No evidence of nephrolithiasis. No evidence of bowel obstruction or ileus. Electronically Signed   By: Lupita Raider M.D.   On: 03-19-2019 19:24   Ct Abdomen Pelvis W Contrast  Result Date: 02/15/2019 CLINICAL DATA:  Left lower quadrant abdominal pain for 3 days, constipation for 2 days EXAM: CT ABDOMEN AND PELVIS WITH CONTRAST TECHNIQUE: Multidetector CT imaging of the abdomen and pelvis was performed using the standard protocol following bolus administration of intravenous contrast. CONTRAST:  OMNIPAQUE IOHEXOL 300 MG/ML  SOLN COMPARISON:  CT chest October 23, 2018 FINDINGS: Lower chest: Basilar areas of pulmonary fibrotic change. No consolidative opacity or evidence of superimposed pneumonia. Cardiac pacer wires are noted directed towards the apex and right atrium. Mild cardiomegaly. No pericardial effusion. Hepatobiliary: No focal liver abnormality is seen. No gallstones, gallbladder wall thickening, or biliary dilatation. Pancreas: Unremarkable. No pancreatic ductal dilatation or surrounding inflammatory changes. Spleen: Normal in size without focal abnormality. Adrenals/Urinary Tract: Normal adrenal glands. Left moderate hydroureteronephrosis to the level of a 4 mm calculus in the mid left ureter. No other visible obstructing urolithiasis. Additional punctate calcification layering in left lateral dependent bladder may reflect a recently passed stone. Fluid attenuation cyst arises from the upper pole left kidney measuring 2 cm in maximal diameter. Additional subcentimeter hypoattenuating foci in both kidneys are too small to fully characterize. There is a slightly delayed left nephrogram and delayed left excretion, likely physiologic and related to  obstruction. Stomach/Bowel: Small hiatal hernia. Stomach and duodenal sweep are otherwise unremarkable. No small bowel dilatation or wall thickening. Some fecalized distal small bowel contents are noted. A normal appendix is visualized. Moderate stool burden within the right colon as well as inspissated fecal material in the rectal vault without perirectal stranding. No evidence of mechanical obstruction. No wall thickening dilatation of the colon. Vascular/Lymphatic: Atherosclerotic plaque within the normal caliber aorta. No suspicious or enlarged lymph nodes in the included lymphatic chains. Reproductive: Prostatomegaly with indentation of the posterior bladder base. Other: Evidence of prior bilateral inguinal hernia repairs with some recurrent fat protrusion into the both inguinal canals. No free fluid or free air in the abdomen or pelvis. Musculoskeletal: Multilevel degenerative changes are present in the imaged portions of the spine. Levocurvature of the lumbar spine with some mild compensatory dextrocurvature of the thoracolumbar junction. No acute osseous abnormality or suspicious osseous lesion. IMPRESSION: 1. Left moderate hydroureteronephrosis to the level of a 4 mm calculus in the mid left ureter. Delayed left nephrogram is likely physiologic and related to this obstruction. 2. Additional punctate calcification layering in the left lateral dependent bladder may reflect a recently passed stone. 3. Moderate stool burden within the right colon as well as inspissated fecal material in the rectal vault, suggestive of constipation with possible fecal impaction. No evidence of mechanical obstruction. 4. Prostatomegaly. 5. Bilateral inguinal hernia repairs with some recurrent fat herniation. 6. Basilar lung fibrosis. 7. Cardiomegaly. 8. Aortic Atherosclerosis (ICD10-I70.0). Electronically Signed   By: Kreg Shropshire M.D.   On: 02/15/2019 00:50  Ct Renal Stone Study  Result Date: March 26, 2019 CLINICAL DATA:   Flank pain, stone disease suspected EXAM: CT ABDOMEN AND PELVIS WITHOUT CONTRAST TECHNIQUE: Multidetector CT imaging of the abdomen and pelvis was performed following the standard protocol without IV contrast. COMPARISON:  CT abdomen pelvis 02/15/2019 FINDINGS: Lower chest: Extensive basilar areas of fibrosis. No superimposed consolidation. Cardiac pacer wires are partially imaged directed towards the apex and right atrium. Normal heart size. No pericardial effusion. Trace pericardial fluid is within normal limits. Hepatobiliary: No focal liver abnormality is seen. No gallstones, gallbladder wall thickening, or biliary dilatation. Pancreas: Unremarkable. No pancreatic ductal dilatation or surrounding inflammatory changes. Spleen: Stable splenic capsular calcifications, unchanged since 2018. No focal splenic lesions. Normal splenic size. Adrenals/Urinary Tract: Normal adrenal glands. Moderate left hydroureteronephrosis to the level of a 5 mm calculus in the distal left ureter demonstrating some distal migration from comparison exam. Stable fluid attenuation cyst in the upper pole left kidney. No worrisome renal lesions. Minimal asymmetric left perinephric stranding. No right urolithiasis or hydronephrosis. Punctate calcification in the left posterolateral bladder near the left UVJ may reflect an additional urinary tract calculus. Mild bladder wall thickening, similar to prior. Stomach/Bowel: Small hiatal hernia. Stomach and duodenal sweep are otherwise unremarkable. No small bowel dilatation or wall thickening. A normal appendix is visualized. No colonic dilatation or wall thickening. Mild nonspecific rectal wall thickening may be related to underdistention. No evidence of obstruction. Vascular/Lymphatic: Atherosclerotic plaque within the normal caliber aorta. Additional calcifications throughout the branch vessels. No aneurysm or ectasia. No suspicious or enlarged lymph nodes in the included lymphatic chains.  Reproductive: Prostatomegaly with indentation of the posterior bladder base, similar to prior. Other: Prior bilateral inguinal hernia repairs with recurrent fat protrusion into the inguinal canals. No bowel containing hernias. Musculoskeletal: Levocurvature of the lumbar spine with multilevel degenerative changes throughout the imaged thoracolumbar levels. No worrisome lytic or sclerotic lesions. IMPRESSION: 1. Moderate left hydroureteronephrosis to the level of a 5 mm calculus in the distal left ureter demonstrating some distal migration from comparison exam. 2. Punctate calcification in the left posterolateral bladder near the left UVJ may reflect an additional urinary tract calculus within the valve or layering within the bladder lumen. 3. Prostatomegaly with indentation of the posterior bladder base, similar to prior. Mild nonspecific thickening of the bladder wall may be reflective of chronic outlet obstruction. Correlate with symptoms and consider urinalysis if there is concern for cystitis. 4. Mild nonspecific rectal wall thickening may be related to underdistention. 5. Basilar fibrotic changes in the lungs. 6.  Aortic Atherosclerosis (ICD10-I70.0). Electronically Signed   By: Kreg Shropshire M.D.   On: 2019/03/26 20:06    Time Spent in minutes 35   Magan Winnett M.D on 02/28/2019 at 1:11 PM  Between 7am to 7pm - Pager - [phone]  After 7pm go to [email or web address] - password [facility]  [facility] -  Office  [phone]

## 2019-02-28 NOTE — ED Notes (Addendum)
ED TO INPATIENT HANDOFF REPORT  ED Nurse Name and Phone #: Geraldine ContrasDee [phone]  S Name/Age/Gender Vincent Kennedy 85 y.o. male Room/Bed: ED11A/ED11A  Code Status   Code Status: Full Code  Home/SNF/Other Home Patient oriented to: self, place, time and situation Is this baseline? Yes   Triage Complete: Triage complete  Chief Complaint kidney stone  Triage Note Pt in via private vehicle on NRB mask. Family reports pt with kidney stone and is scheduled for Lithro on Thursday but his pain is worse and he is requireing more oxygen. Pt currently on 30l of oxygen.   Pt family reports pt cannot have any blood thinners due to upcoming procedure.    Allergies No Known Allergies  Level of Care/Admitting Diagnosis ED Disposition    ED Disposition Condition Comment   Admit  Hospital [facility] [[id number]]  Level of Care: Med-Surg [16]  Covid Evaluation: Asymptomatic Screening Protocol (No Symptoms)  Diagnosis: Nephrolithiasis [[id number]][[id number]]  Admitting Physician: Anselm JunglingU, CHING T [[id number]]  Attending Physician: Anselm JunglingU, CHING T [[id number]]  PT Class (Do Not Modify): Observation [104]  PT Acc Code (Do Not Modify): Observation [10022]       B Medical/Surgery History Past Medical History:  Diagnosis Date  . BPH (benign prostatic hyperplasia)   . Coronary artery disease    a. 11/2006 Cath: D1 99ost (2.5x15 Xience DES), D2 90ost (small); b. 12/2017 PCI: LAD 95 (3.25x8 & 3.0x 33 Sierra DES'. D1 patent stent. D2 30ost. EF 55-65%;  c. 08/2017 Cath: LM min irregs, LAD 40ost, patent stents, D1 patent stent, LCX nl, OM1/2/3 nl, RCA 30ost, RPDA/RPAV/RPL1/2/3 nl.  . DOE (dyspnea on exertion)    a. Chronic supplemental O2 in setting of pulm fibrosis; b. 10/2017 Echo: Nl LV size/fxn. Mild Ao sclerosis. Trace to mild TR.  [name]. GERD (gastroesophageal reflux disease)   . Hyperlipidemia   . Hypertension   . On home oxygen therapy    "2-3L; 24/7" (01/19/2017); "6L 24/7" (04/06/2018)  . OSA on CPAP    . Oxygen deficiency   . Pacemaker 04/07/2018  . Pneumonia 07/2016  . Pulmonary fibrosis (HCC) 08/26/2015  . Wears hearing aid    (sometimes)   Past Surgical History:  Procedure Laterality Date  . CATARACT EXTRACTION W/PHACO Right 01/29/2015   Procedure: CATARACT EXTRACTION PHACO AND INTRAOCULAR LENS PLACEMENT (IOC);  Surgeon: [name], MD;  [facility];  Service: Ophthalmology;  Laterality: Right;  RESTOR LENS CPAP  . CATARACT EXTRACTION W/PHACO Left 03/12/2015   Procedure: CATARACT EXTRACTION PHACO AND INTRAOCULAR LENS PLACEMENT (IOC);  Surgeon: [name], MD;  [facility];  Service: Ophthalmology;  Laterality: Left;  RESTOR SHUGARCAINE  . COLONOSCOPY WITH ESOPHAGOGASTRODUODENOSCOPY (EGD)    . CORONARY ANGIOPLASTY WITH STENT PLACEMENT  2010   "1 stent"  . CORONARY ANGIOPLASTY WITH STENT PLACEMENT  01/19/2017  . CORONARY ATHERECTOMY N/A 01/19/2017   Procedure: CORONARY ATHERECTOMY - CSI;  Surgeon: [name], Muhammad A, MD;  Location: [facility];  Service: Cardiovascular;  Laterality: N/A;  unable to cross lesion  . CORONARY STENT INTERVENTION N/A 01/19/2017   Procedure: CORONARY STENT INTERVENTION;  Surgeon: [name], Muhammad A, MD;  Location: [facility];  Service: Cardiovascular;  Laterality: N/A;  . INGUINAL HERNIA REPAIR Bilateral   . LEFT HEART CATH AND CORONARY ANGIOGRAPHY Left 09/12/2017   Procedure: LEFT HEART CATH AND CORONARY ANGIOGRAPHY;  Surgeon: [name], Muhammad A, MD;  Location: [facility] INVASIVE CV LAB;  Service: Cardiovascular;  Laterality: Left;  .  PACEMAKER IMPLANT N/A 04/07/2018   Procedure: PACEMAKER IMPLANT;  Surgeon: Marinus Maw, MD;  Location: [facility] INVASIVE CV LAB;  Service: Cardiovascular;  Laterality: N/A;  . RIGHT/LEFT HEART CATH AND CORONARY ANGIOGRAPHY N/A 01/12/2017   Procedure: RIGHT/LEFT HEART CATH AND CORONARY ANGIOGRAPHY;  Surgeon: Iran Ouch, MD;  Location: [facility];  Service:  Cardiovascular;  Laterality: N/A;     A IV Location/Drains/Wounds Patient Lines/Drains/Airways Status   Active Line/Drains/Airways    Name:   Placement date:   Placement time:   Site:   Days:   Peripheral IV 02/26/2019 Right Antecubital   02/28/2019    1927    Antecubital   1          Intake/Output Last 24 hours No intake or output data in the 24 hours ending 02/28/19 0041  Labs/Imaging Results for orders placed or performed during the hospital encounter of 03/19/2019 (from the past 48 hour(s))  CBC with Differential     Status: None   Collection Time: 02/20/2019  7:29 PM  Result Value Ref Range   WBC 9.1 4.0 - 10.5 K/uL   RBC 4.45 4.22 - 5.81 MIL/uL   Hemoglobin 13.0 13.0 - 17.0 g/dL   HCT 53.6 64.4 - 03.4 %   MCV 90.6 80.0 - 100.0 fL   MCH 29.2 26.0 - 34.0 pg   MCHC 32.3 30.0 - 36.0 g/dL   RDW 74.2 59.5 - 63.8 %   Platelets 194 150 - 400 K/uL   nRBC 0.0 0.0 - 0.2 %   Neutrophils Relative % 83 %   Neutro Abs 7.5 1.7 - 7.7 K/uL   Lymphocytes Relative 9 %   Lymphs Abs 0.8 0.7 - 4.0 K/uL   Monocytes Relative 6 %   Monocytes Absolute 0.6 0.1 - 1.0 K/uL   Eosinophils Relative 1 %   Eosinophils Absolute 0.1 0.0 - 0.5 K/uL   Basophils Relative 1 %   Basophils Absolute 0.1 0.0 - 0.1 K/uL   Immature Granulocytes 0 %   Abs Immature Granulocytes 0.04 0.00 - 0.07 K/uL    Comment: Performed at [facility], [address] [place], [place] [address]  Comprehensive metabolic panel     Status: Abnormal   Collection Time: 03/05/2019  8:42 PM  Result Value Ref Range   Sodium 135 135 - 145 mmol/L   Potassium 4.2 3.5 - 5.1 mmol/L   Chloride 101 98 - 111 mmol/L   CO2 25 22 - 32 mmol/L   Glucose, Bld 118 (H) 70 - 99 mg/dL   BUN 15 8 - 23 mg/dL   Creatinine, Ser 3.29 (H) 0.61 - 1.24 mg/dL   Calcium 8.5 (L) 8.9 - 10.3 mg/dL   Total Protein 8.2 (H) 6.5 - 8.1 g/dL   Albumin 3.2 (L) 3.5 - 5.0 g/dL   AST 24 15 - 41 U/L   ALT 20 0 - 44 U/L   Alkaline Phosphatase 73 38 - 126 U/L    Total Bilirubin 0.6 0.3 - 1.2 mg/dL   GFR calc non Af Amer 50 (L) >60 mL/min   GFR calc Af Amer 58 (L) >60 mL/min   Anion gap 9 5 - 15    Comment: Performed at [facility], [address], [place], [place] [address]   Dg Chest 2 View  Result Date: 03/17/2019 CLINICAL DATA:  Dyspnea. EXAM: CHEST - 2 VIEW COMPARISON:  Aug 10, 2018. FINDINGS: Stable cardiomediastinal silhouette. Left-sided pacemaker is unchanged in position. Stable fibrotic  changes are noted throughout both lungs. No definite acute abnormality is noted. No pneumothorax or pleural effusion is noted. Bony thorax is unremarkable. IMPRESSION: Stable fibrotic changes are noted throughout both lungs. No acute abnormality is noted. Electronically Signed   By: Marijo Conception M.D.   On: 03/03/2019 19:25   Dg Abdomen 1 View  Result Date: 02/25/2019 CLINICAL DATA:  Renal colic. EXAM: ABDOMEN - 1 VIEW COMPARISON:  None. FINDINGS: The bowel gas pattern is normal. No radio-opaque calculi or other significant radiographic abnormality are seen. Status post hernia repair. IMPRESSION: No evidence of nephrolithiasis. No evidence of bowel obstruction or ileus. Electronically Signed   By: Marijo Conception M.D.   On: 03/14/2019 19:24   Ct Renal Stone Study  Result Date: 03/18/2019 CLINICAL DATA:  Flank pain, stone disease suspected EXAM: CT ABDOMEN AND PELVIS WITHOUT CONTRAST TECHNIQUE: Multidetector CT imaging of the abdomen and pelvis was performed following the standard protocol without IV contrast. COMPARISON:  CT abdomen pelvis 02/15/2019 FINDINGS: Lower chest: Extensive basilar areas of fibrosis. No superimposed consolidation. Cardiac pacer wires are partially imaged directed towards the apex and right atrium. Normal heart size. No pericardial effusion. Trace pericardial fluid is within normal limits. Hepatobiliary: No focal liver abnormality is seen. No gallstones, gallbladder wall thickening, or biliary dilatation. Pancreas: Unremarkable.  No pancreatic ductal dilatation or surrounding inflammatory changes. Spleen: Stable splenic capsular calcifications, unchanged since 2018. No focal splenic lesions. Normal splenic size. Adrenals/Urinary Tract: Normal adrenal glands. Moderate left hydroureteronephrosis to the level of a 5 mm calculus in the distal left ureter demonstrating some distal migration from comparison exam. Stable fluid attenuation cyst in the upper pole left kidney. No worrisome renal lesions. Minimal asymmetric left perinephric stranding. No right urolithiasis or hydronephrosis. Punctate calcification in the left posterolateral bladder near the left UVJ may reflect an additional urinary tract calculus. Mild bladder wall thickening, similar to prior. Stomach/Bowel: Small hiatal hernia. Stomach and duodenal sweep are otherwise unremarkable. No small bowel dilatation or wall thickening. A normal appendix is visualized. No colonic dilatation or wall thickening. Mild nonspecific rectal wall thickening may be related to underdistention. No evidence of obstruction. Vascular/Lymphatic: Atherosclerotic plaque within the normal caliber aorta. Additional calcifications throughout the branch vessels. No aneurysm or ectasia. No suspicious or enlarged lymph nodes in the included lymphatic chains. Reproductive: Prostatomegaly with indentation of the posterior bladder base, similar to prior. Other: Prior bilateral inguinal hernia repairs with recurrent fat protrusion into the inguinal canals. No bowel containing hernias. Musculoskeletal: Levocurvature of the lumbar spine with multilevel degenerative changes throughout the imaged thoracolumbar levels. No worrisome lytic or sclerotic lesions. IMPRESSION: 1. Moderate left hydroureteronephrosis to the level of a 5 mm calculus in the distal left ureter demonstrating some distal migration from comparison exam. 2. Punctate calcification in the left posterolateral bladder near the left UVJ may reflect an  additional urinary tract calculus within the valve or layering within the bladder lumen. 3. Prostatomegaly with indentation of the posterior bladder base, similar to prior. Mild nonspecific thickening of the bladder wall may be reflective of chronic outlet obstruction. Correlate with symptoms and consider urinalysis if there is concern for cystitis. 4. Mild nonspecific rectal wall thickening may be related to underdistention. 5. Basilar fibrotic changes in the lungs. 6.  Aortic Atherosclerosis (ICD10-I70.0). Electronically Signed   By: Lovena Le M.D.   On: 03/05/2019 20:06    Pending Labs Unresulted Labs (From admission, onward)    Start     Ordered  02/28/19 0500  Basic metabolic panel  Tomorrow morning,   STAT     03/12/2019 2252   02/28/19 0500  CBC  Tomorrow morning,   STAT     02/26/2019 2252   03/12/2019 1852  Urinalysis, Complete w Microscopic  Once,   STAT     03/17/2019 1852          Vitals/Pain Today's Vitals   03/21/2019 2230 02/26/2019 2257 03/14/2019 2302 02/28/2019 2330  BP: (!) 97/48 (!) 83/44 (!) 88/58 91/60  Pulse:  80 72 72  Resp:      Temp:      TempSrc:      SpO2:  98% 92% 94%  Weight:      Height:      PainSc:        Isolation Precautions No active isolations  Medications Medications  oxyCODONE-acetaminophen (PERCOCET/ROXICET) 5-325 MG per tablet 1 tablet (0 tablets Oral Hold 02/28/2019 2331)  morphine 2 MG/ML injection 1 mg (has no administration in time range)  mometasone-formoterol (DULERA) 100-5 MCG/ACT inhaler 2 puff (has no administration in time range)  ipratropium-albuterol (DUONEB) 0.5-2.5 (3) MG/3ML nebulizer solution 3 mL (has no administration in time range)  pantoprazole (PROTONIX) EC tablet 40 mg (has no administration in time range)  mirtazapine (REMERON) tablet 30 mg (30 mg Oral Given 02/28/19 0014)  rivastigmine (EXELON) 9.5 mg/24hr 9.5 mg (has no administration in time range)  zolpidem (AMBIEN) tablet 5 mg (5 mg Oral Given 02/28/19 0016)  Pirfenidone  CAPS 801 mg ( Oral Canceled Entry 02/26/2019 2348)  HYDROmorphone (DILAUDID) injection 0.5 mg (0.5 mg Intravenous Given 03/16/2019 1933)  ondansetron (ZOFRAN) injection 4 mg (4 mg Intravenous Given 03/20/2019 1933)  ketorolac (TORADOL) 30 MG/ML injection 15 mg (15 mg Intravenous Given 03/11/2019 2101)  sodium chloride 0.9 % bolus 500 mL (0 mLs Intravenous Stopped 02/28/19 0009)    Mobility walks with device High fall risk   Focused Assessments    R Recommendations: See Admitting Provider Note  Report given to: Lowella Bandy, RN

## 2019-03-01 ENCOUNTER — Encounter: Admission: EM | Disposition: E | Payer: Self-pay | Source: Home / Self Care | Attending: Internal Medicine

## 2019-03-01 ENCOUNTER — Encounter: Admission: RE | Payer: Self-pay | Source: Home / Self Care

## 2019-03-01 ENCOUNTER — Ambulatory Visit: Admission: RE | Admit: 2019-03-01 | Payer: Medicare Other | Source: Home / Self Care | Admitting: Urology

## 2019-03-01 DIAGNOSIS — I469 Cardiac arrest, cause unspecified: Secondary | ICD-10-CM

## 2019-03-01 LAB — GLUCOSE, CAPILLARY: Glucose-Capillary: 162 mg/dL — ABNORMAL HIGH (ref 70–99)

## 2019-03-01 SURGERY — LITHOTRIPSY, ESWL
Anesthesia: Monitor Anesthesia Care | Laterality: Left

## 2019-03-01 SURGERY — CYSTOSCOPY/URETEROSCOPY/HOLMIUM LASER/STENT PLACEMENT
Anesthesia: Choice | Laterality: Left

## 2019-03-01 MED ORDER — NALOXONE HCL 0.4 MG/ML IJ SOLN
INTRAMUSCULAR | Status: AC
  Filled 2019-03-01: qty 1

## 2019-03-01 SURGICAL SUPPLY — 28 items
BAG DRAIN CYSTO-URO LG1000N (MISCELLANEOUS) ×2 IMPLANT
BASKET ZERO TIP 1.9FR (BASKET) IMPLANT
BRUSH SCRUB EZ 1% IODOPHOR (MISCELLANEOUS) ×2 IMPLANT
CATH URETL 5X70 OPEN END (CATHETERS) IMPLANT
CNTNR SPEC 2.5X3XGRAD LEK (MISCELLANEOUS)
CONT SPEC 4OZ STER OR WHT (MISCELLANEOUS)
CONTAINER SPEC 2.5X3XGRAD LEK (MISCELLANEOUS) IMPLANT
DRAPE UTILITY 15X26 TOWEL STRL (DRAPES) ×2 IMPLANT
FIBER LASER TRAC TIP (UROLOGICAL SUPPLIES) IMPLANT
GLOVE BIO SURGEON STRL SZ8 (GLOVE) ×2 IMPLANT
GOWN STRL REUS W/ TWL LRG LVL3 (GOWN DISPOSABLE) ×1 IMPLANT
GOWN STRL REUS W/ TWL XL LVL3 (GOWN DISPOSABLE) ×1 IMPLANT
GOWN STRL REUS W/TWL LRG LVL3 (GOWN DISPOSABLE) ×1
GOWN STRL REUS W/TWL XL LVL3 (GOWN DISPOSABLE) ×1
GUIDEWIRE STR DUAL SENSOR (WIRE) ×2 IMPLANT
INFUSOR MANOMETER BAG 3000ML (MISCELLANEOUS) ×2 IMPLANT
INTRODUCER DILATOR DOUBLE (INTRODUCER) IMPLANT
KIT TURNOVER CYSTO (KITS) ×2 IMPLANT
PACK CYSTO AR (MISCELLANEOUS) ×2 IMPLANT
SET CYSTO W/LG BORE CLAMP LF (SET/KITS/TRAYS/PACK) ×2 IMPLANT
SHEATH URETERAL 12FRX35CM (MISCELLANEOUS) IMPLANT
SOL .9 NS 3000ML IRR  AL (IV SOLUTION) ×1
SOL .9 NS 3000ML IRR UROMATIC (IV SOLUTION) ×1 IMPLANT
STENT URET 6FRX24 CONTOUR (STENTS) IMPLANT
STENT URET 6FRX26 CONTOUR (STENTS) IMPLANT
SURGILUBE 2OZ TUBE FLIPTOP (MISCELLANEOUS) ×2 IMPLANT
VALVE UROSEAL ADJ ENDO (VALVE) IMPLANT
WATER STERILE IRR 1000ML POUR (IV SOLUTION) ×2 IMPLANT

## 2019-03-08 ENCOUNTER — Other Ambulatory Visit: Payer: Self-pay | Admitting: Cardiovascular Disease

## 2019-03-23 NOTE — Progress Notes (Addendum)
Ch responded to pg from charge nurse regarding pt passing. Pt is a 86 y.o. oxygen dependent pt with dementia that was being treated for respiratory failure. Ch provided a compassionate presence for the pt's family bedside who were experiencing significant emotional distress regarding the passing of the pt. Ch facilitated the family and clergy members to ensure the pt's religious/spiritual needs were met. 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 Ch made sure family felt supported and communicated with staff to determine the pt's family immediate needs. Ch presence was appreciated.

## 2019-03-23 NOTE — Discharge Summary (Signed)
Physician Discharge Summary  Vincent Kennedy:[id number] DOB: 17-Jan-1933 DOA: 03/04/2019  PCP: [facility]  Admit date: 03/13/2019 Discharge date: 02/21/2019  Admitted From: Home  Disposition: Patient expired on 03/15/2019 at 4: 10 AM     Discharge Diagnoses:  Active Problems:   Chronic hypoxemic respiratory failure (HCC)   AKI (acute kidney injury) (HCC)   Hydroureteronephrosis   Cardiac arrest (HCC)   Pulmonary fibrosis (HCC)   CAD S/P percutaneous coronary angioplasty Complete heart block s/p pacemaker status   Brief narrative/HPI 86 year old male with history of CAD s/p stenting, heart block s/p pacemaker, pulmonary fibrosis with chronic hypoxemic respiratory failure on 15 L NRB at home, OSA not on CPAP, dementia, and GERD presented with worsening left flank pain associated with nausea and vomiting.  He was seen in the ED 10 days back with left-sided urolithiasis and mild AKI, given IV fluids and discharged home with urology follow-up.  He was seen by urologist Dr. Apolinar Junes on 12/2 with plan on ESWL this week and was advised to hold his aspirin and Plavix due to risk of perinephric hematoma.  Patient was cleared by cardiology for the procedure. Patient on the day of admission started having left-sided flank pain associated with nausea and vomiting.  He also reportedly had oxygen desaturation with ambulation.  His wife increased his Vicodin dose to 7.5 mg without much relief and brought to the ED. In the ED he was hypotensive with blood pressure of 97/48 mmHg, given IV fluids, IV Dilaudid and IV Toradol.  He was afebrile and no leukocytosis.  Had mild AKI with creatinine 1.31. CT renal stone study showed moderate left hydronephrosis with 5 mm calculus in the distal left ureter.  Admitted for further pain management and urology consult.   Hospital course Obstructive urolithiasis with left-sided hydronephrosis Patient was planned for lithotripsy as outpatient  but presented with acute left flank pain.  Seen by urology and plan for surgery on 12/11.  Given his extensive cardiac history he was evaluated by cardiology about 1 week prior to hospitalization and cleared for surgery without further cardiac evaluation or intervention needed.  Received IV morphine and Percocet and pain was well controlled.  Aspirin and Plavix were held for surgery.   Cardiac arrest and unresponsiveness. Patient was found to be unresponsive early on the morning by the nursing staff.  Duration was unresponsiveness unknown and patient was not a cardiac monitor.  CODE BLUE was called and started on continuous CPR.  Initial rhythm was PEA arrest.  Patient was unresponsive and pulseless with cyanotic extremities.  ACLS protocol initiated and was intubated by ED physician, received several rounds of epinephrine and bicarbonate, IV Narcan with concern for sedation with narcotic.  His blood glucose was 106.  Despite all efforts for active resuscitation patient remained in PEA arrest and he was pronounced dead at 4:10 AM. Family was notified who arrived shortly.  Did not wish for autopsy.  Spoke with several family members at bedside.  I also spoke with patient's pulmonologist Dr.khan who is made aware.  Acute on chronic hypoxemic respiratory failure (HCC) Chronic pulmonary fibrosis Patient on chronic oxygen 15 L at rest. He had a transient drop in o2 sat to 70s and 80s on trying to get out of bed in the morning of 12/9.  Suspected this may be due to IV fluids he received in the ED on admission.  Given IV Lasix after which he sat remained in the mid to high 90s on  15 L without any complaint of increased dyspnea.   Active Problems:   Coronary artery disease involving native coronary artery of native heart without angina pectoris      AKI (acute kidney injury) (HCC) Mild likely associated with poor p.o. intake and hydronephrosis.    Had received IV fluids in the ED, which were discontinued  and received a dose of IV Lasix.  Renal function had improved in a.m. lab   Discharge Instructions   Allergies as of 09/17/18   No Known Allergies      Procedures/Studies: DG Chest 2 View  Result Date: 03/18/2019 CLINICAL DATA:  Dyspnea. EXAM: CHEST - 2 VIEW COMPARISON:  Aug 10, 2018. FINDINGS: Stable cardiomediastinal silhouette. Left-sided pacemaker is unchanged in position. Stable fibrotic changes are noted throughout both lungs. No definite acute abnormality is noted. No pneumothorax or pleural effusion is noted. Bony thorax is unremarkable. IMPRESSION: Stable fibrotic changes are noted throughout both lungs. No acute abnormality is noted. Electronically Signed   By: Lupita RaiderJames  Green Jr M.D.   On: 03/21/2019 19:25   DG Abdomen 1 View  Result Date: 03/18/2019 CLINICAL DATA:  Renal colic. EXAM: ABDOMEN - 1 VIEW COMPARISON:  None. FINDINGS: The bowel gas pattern is normal. No radio-opaque calculi or other significant radiographic abnormality are seen. Status post hernia repair. IMPRESSION: No evidence of nephrolithiasis. No evidence of bowel obstruction or ileus. Electronically Signed   By: Lupita RaiderJames  Green Jr M.D.   On: 02/25/2019 19:24   CT Abdomen Pelvis W Contrast  Result Date: 02/15/2019 CLINICAL DATA:  Left lower quadrant abdominal pain for 3 days, constipation for 2 days EXAM: CT ABDOMEN AND PELVIS WITH CONTRAST TECHNIQUE: Multidetector CT imaging of the abdomen and pelvis was performed using the standard protocol following bolus administration of intravenous contrast. CONTRAST:  100mL OMNIPAQUE IOHEXOL 300 MG/ML  SOLN COMPARISON:  CT chest October 23, 2018 FINDINGS: Lower chest: Basilar areas of pulmonary fibrotic change. No consolidative opacity or evidence of superimposed pneumonia. Cardiac pacer wires are noted directed towards the apex and right atrium. Mild cardiomegaly. No pericardial effusion. Hepatobiliary: No focal liver abnormality is seen. No gallstones, gallbladder wall  thickening, or biliary dilatation. Pancreas: Unremarkable. No pancreatic ductal dilatation or surrounding inflammatory changes. Spleen: Normal in size without focal abnormality. Adrenals/Urinary Tract: Normal adrenal glands. Left moderate hydroureteronephrosis to the level of a 4 mm calculus in the mid left ureter. No other visible obstructing urolithiasis. Additional punctate calcification layering in left lateral dependent bladder may reflect a recently passed stone. Fluid attenuation cyst arises from the upper pole left kidney measuring 2 cm in maximal diameter. Additional subcentimeter hypoattenuating foci in both kidneys are too small to fully characterize. There is a slightly delayed left nephrogram and delayed left excretion, likely physiologic and related to obstruction. Stomach/Bowel: Small hiatal hernia. Stomach and duodenal sweep are otherwise unremarkable. No small bowel dilatation or wall thickening. Some fecalized distal small bowel contents are noted. A normal appendix is visualized. Moderate stool burden within the right colon as well as inspissated fecal material in the rectal vault without perirectal stranding. No evidence of mechanical obstruction. No wall thickening dilatation of the colon. Vascular/Lymphatic: Atherosclerotic plaque within the normal caliber aorta. No suspicious or enlarged lymph nodes in the included lymphatic chains. Reproductive: Prostatomegaly with indentation of the posterior bladder base. Other: Evidence of prior bilateral inguinal hernia repairs with some recurrent fat protrusion into the both inguinal canals. No free fluid or free air in the abdomen or pelvis. Musculoskeletal: Multilevel degenerative  changes are present in the imaged portions of the spine. Levocurvature of the lumbar spine with some mild compensatory dextrocurvature of the thoracolumbar junction. No acute osseous abnormality or suspicious osseous lesion. IMPRESSION: 1. Left moderate hydroureteronephrosis  to the level of a 4 mm calculus in the mid left ureter. Delayed left nephrogram is likely physiologic and related to this obstruction. 2. Additional punctate calcification layering in the left lateral dependent bladder may reflect a recently passed stone. 3. Moderate stool burden within the right colon as well as inspissated fecal material in the rectal vault, suggestive of constipation with possible fecal impaction. No evidence of mechanical obstruction. 4. Prostatomegaly. 5. Bilateral inguinal hernia repairs with some recurrent fat herniation. 6. Basilar lung fibrosis. 7. Cardiomegaly. 8. Aortic Atherosclerosis (ICD10-I70.0). Electronically Signed   By: Kreg Shropshire M.D.   On: 02/15/2019 00:50   CT Renal Stone Study  Result Date: 02/28/2019 CLINICAL DATA:  Flank pain, stone disease suspected EXAM: CT ABDOMEN AND PELVIS WITHOUT CONTRAST TECHNIQUE: Multidetector CT imaging of the abdomen and pelvis was performed following the standard protocol without IV contrast. COMPARISON:  CT abdomen pelvis 02/15/2019 FINDINGS: Lower chest: Extensive basilar areas of fibrosis. No superimposed consolidation. Cardiac pacer wires are partially imaged directed towards the apex and right atrium. Normal heart size. No pericardial effusion. Trace pericardial fluid is within normal limits. Hepatobiliary: No focal liver abnormality is seen. No gallstones, gallbladder wall thickening, or biliary dilatation. Pancreas: Unremarkable. No pancreatic ductal dilatation or surrounding inflammatory changes. Spleen: Stable splenic capsular calcifications, unchanged since 2018. No focal splenic lesions. Normal splenic size. Adrenals/Urinary Tract: Normal adrenal glands. Moderate left hydroureteronephrosis to the level of a 5 mm calculus in the distal left ureter demonstrating some distal migration from comparison exam. Stable fluid attenuation cyst in the upper pole left kidney. No worrisome renal lesions. Minimal asymmetric left perinephric  stranding. No right urolithiasis or hydronephrosis. Punctate calcification in the left posterolateral bladder near the left UVJ may reflect an additional urinary tract calculus. Mild bladder wall thickening, similar to prior. Stomach/Bowel: Small hiatal hernia. Stomach and duodenal sweep are otherwise unremarkable. No small bowel dilatation or wall thickening. A normal appendix is visualized. No colonic dilatation or wall thickening. Mild nonspecific rectal wall thickening may be related to underdistention. No evidence of obstruction. Vascular/Lymphatic: Atherosclerotic plaque within the normal caliber aorta. Additional calcifications throughout the branch vessels. No aneurysm or ectasia. No suspicious or enlarged lymph nodes in the included lymphatic chains. Reproductive: Prostatomegaly with indentation of the posterior bladder base, similar to prior. Other: Prior bilateral inguinal hernia repairs with recurrent fat protrusion into the inguinal canals. No bowel containing hernias. Musculoskeletal: Levocurvature of the lumbar spine with multilevel degenerative changes throughout the imaged thoracolumbar levels. No worrisome lytic or sclerotic lesions. IMPRESSION: 1. Moderate left hydroureteronephrosis to the level of a 5 mm calculus in the distal left ureter demonstrating some distal migration from comparison exam. 2. Punctate calcification in the left posterolateral bladder near the left UVJ may reflect an additional urinary tract calculus within the valve or layering within the bladder lumen. 3. Prostatomegaly with indentation of the posterior bladder base, similar to prior. Mild nonspecific thickening of the bladder wall may be reflective of chronic outlet obstruction. Correlate with symptoms and consider urinalysis if there is concern for cystitis. 4. Mild nonspecific rectal wall thickening may be related to underdistention. 5. Basilar fibrotic changes in the lungs. 6.  Aortic Atherosclerosis (ICD10-I70.0).  Electronically Signed   By: Kreg Shropshire M.D.   On: 02/28/2019  20:06       Discharge Exam: Vitals:   02/28/19 1855 28-Mar-2019 0025  BP: 137/73 (!) 159/83  Pulse: 83 71  Resp:  19  Temp:  98.4 F (36.9 C)  SpO2: 98% 100%   Vitals:   02/28/19 1740 02/28/19 1800 02/28/19 1855 03-28-2019 0025  BP: 122/62  137/73 (!) 159/83  Pulse: 69  83 71  Resp: 18   19  Temp: 98 F (36.7 C)   98.4 F (36.9 C)  TempSrc: Oral   Oral  SpO2: 98% 97% 98% 100%  Weight:      Height:           The results of significant diagnostics from this hospitalization (including imaging, microbiology, ancillary and laboratory) are listed below for reference.     Microbiology: Recent Results (from the past 240 hour(s))  Urine Culture (for PAT)     Status: Abnormal   Collection Time: 02/23/19  3:21 PM   Specimen: Urine, Random  Result Value Ref Range Status   Specimen Description   Final    URINE, RANDOM Performed at [facility], [address] [place], [place] [address]    Special Requests   Final    NONE Performed at [facility], [address] [place], [place] [address]    Culture MULTIPLE SPECIES PRESENT, SUGGEST RECOLLECTION (A)  Final   Report Status 02/25/2019 FINAL  Final  SARS CORONAVIRUS 2 (TAT 6-24 HRS) Nasopharyngeal Nasopharyngeal Swab     Status: None   Collection Time: 02/26/19 12:03 PM   Specimen: Nasopharyngeal Swab  Result Value Ref Range Status   SARS Coronavirus 2 NEGATIVE NEGATIVE Final    Comment: (NOTE) SARS-CoV-2 target nucleic acids are NOT DETECTED. The SARS-CoV-2 RNA is generally detectable in upper and lower respiratory specimens during the acute phase of infection. Negative results do not preclude SARS-CoV-2 infection, do not rule out co-infections with other pathogens, and should not be used as the sole basis for treatment or other patient management decisions. Negative results must be combined with clinical observations, patient history,  and epidemiological information. The expected result is Negative. Fact Sheet for Patients: SugarRoll.be Fact Sheet for Healthcare Providers: https://www.woods-mathews.com/ This test is not yet approved or cleared by the [place] FDA and  has been authorized for detection and/or diagnosis of SARS-CoV-2 by FDA under an Emergency Use Authorization (EUA). This EUA will remain  in effect (meaning this test can be used) for the duration of the COVID-19 declaration under Section 56 4(b)(1) of the Act, 21 U.S.C. section 360bbb-3(b)(1), unless the authorization is terminated or revoked sooner. Performed at [facility], [place] [address] [place], [place] [address]      Labs: BNP (last 3 results) No results for input(s): BNP in the last 8760 hours. Basic Metabolic Panel: Recent Labs  Lab 03/02/2019 2042 02/28/19 0641  NA 135 138  K 4.2 4.4  CL 101 105  CO2 25 25  GLUCOSE 118* 90  BUN 15 15  CREATININE 1.31* 1.31*  CALCIUM 8.5* 8.2*   Liver Function Tests: Recent Labs  Lab 03/06/2019 2042  AST 24  ALT 20  ALKPHOS 73  BILITOT 0.6  PROT 8.2*  ALBUMIN 3.2*   No results for input(s): LIPASE, AMYLASE in the last 168 hours. No results for input(s): AMMONIA in the last 168 hours. CBC: Recent Labs  Lab 03/17/2019 1929 02/28/19 0641  WBC 9.1 8.1  NEUTROABS 7.5  --   HGB 13.0 11.8*  HCT  40.3 35.4*  MCV 90.6 88.1  PLT 194 167   Cardiac Enzymes: No results for input(s): CKTOTAL, CKMB, CKMBINDEX, TROPONINI in the last 168 hours. BNP: Invalid input(s): POCBNP CBG: Recent Labs  Lab 04-Mar-2019 0405  GLUCAP 162*   D-Dimer No results for input(s): DDIMER in the last 72 hours. Hgb A1c No results for input(s): HGBA1C in the last 72 hours. Lipid Profile No results for input(s): CHOL, HDL, LDLCALC, TRIG, CHOLHDL, LDLDIRECT in the last 72 hours. Thyroid function studies No results for input(s): TSH, T4TOTAL, T3FREE, THYROIDAB in the  last 72 hours.  Invalid input(s): FREET3 Anemia work up No results for input(s): VITAMINB12, FOLATE, FERRITIN, TIBC, IRON, RETICCTPCT in the last 72 hours. Urinalysis    Component Value Date/Time   COLORURINE YELLOW (A) 03/19/2019 1929   APPEARANCEUR CLEAR (A) 02/24/2019 1929   APPEARANCEUR Clear 11/07/2018 1458   LABSPEC 1.016 03/15/2019 1929   PHURINE 5.0 03/10/2019 1929   GLUCOSEU NEGATIVE 02/22/2019 1929   HGBUR NEGATIVE 03/22/2019 1929   BILIRUBINUR NEGATIVE 03/06/2019 1929   BILIRUBINUR Negative 11/07/2018 1458   KETONESUR NEGATIVE 03/10/2019 1929   PROTEINUR NEGATIVE 03/11/2019 1929   NITRITE NEGATIVE 02/24/2019 1929   LEUKOCYTESUR NEGATIVE 02/25/2019 1929   Sepsis Labs Invalid input(s): PROCALCITONIN,  WBC,  LACTICIDVEN Microbiology Recent Results (from the past 240 hour(s))  Urine Culture (for PAT)     Status: Abnormal   Collection Time: 02/23/19  3:21 PM   Specimen: Urine, Random  Result Value Ref Range Status   Specimen Description   Final    URINE, RANDOM Performed at [facility], [address] [place], [place] [address]    Special Requests   Final    NONE Performed at [facility], [address] [place], [place] [address]    Culture MULTIPLE SPECIES PRESENT, SUGGEST RECOLLECTION (A)  Final   Report Status 02/25/2019 FINAL  Final  SARS CORONAVIRUS 2 (TAT 6-24 HRS) Nasopharyngeal Nasopharyngeal Swab     Status: None   Collection Time: 02/26/19 12:03 PM   Specimen: Nasopharyngeal Swab  Result Value Ref Range Status   SARS Coronavirus 2 NEGATIVE NEGATIVE Final    Comment: (NOTE) SARS-CoV-2 target nucleic acids are NOT DETECTED. The SARS-CoV-2 RNA is generally detectable in upper and lower respiratory specimens during the acute phase of infection. Negative results do not preclude SARS-CoV-2 infection, do not rule out co-infections with other pathogens, and should not be used as the sole basis for treatment or other patient management  decisions. Negative results must be combined with clinical observations, patient history, and epidemiological information. The expected result is Negative. Fact Sheet for Patients: HairSlick.no Fact Sheet for Healthcare Providers: quierodirigir.com This test is not yet approved or cleared by the [place] FDA and  has been authorized for detection and/or diagnosis of SARS-CoV-2 by FDA under an Emergency Use Authorization (EUA). This EUA will remain  in effect (meaning this test can be used) for the duration of the COVID-19 declaration under Section 56 4(b)(1) of the Act, 21 U.S.C. section 360bbb-3(b)(1), unless the authorization is terminated or revoked sooner. Performed at [facility], [address] [place], [place] [address]      Time coordinating discharge: 25  SIGNED:   Eddie North, MD  [facility] 03/04/2019, 3:03 PM Pager   If 7PM-7AM, please contact night-coverage [email or web address] Password [email or web address]

## 2019-03-23 NOTE — Progress Notes (Signed)
[facility] notified of death. Service number is [phone]. Pt was released for  funeral home

## 2019-03-23 NOTE — ED Provider Notes (Signed)
[facility] Department of Emergency Medicine   Code Blue CONSULT NOTE  Chief Complaint: Cardiac arrest/unresponsive   Level V Caveat: Unresponsive  History of present illness: I was contacted by the hospital for a CODE BLUE cardiac arrest upstairs and presented to the patient's bedside.   Upon arrival, CPR was ongoing, patient being bagged by RT. Initial rhythm PEA arrest. Patient unresponsive and pulseless. Cyanotic extremities.   ROS: Unable to obtain, Level V caveat  Scheduled Meds: . mirtazapine  30 mg Oral QHS  . mometasone-formoterol  2 puff Inhalation BID  . naloxone      . naloxone      . oxyCODONE-acetaminophen  1 tablet Oral Q6H  . pantoprazole  40 mg Oral Daily  . Pirfenidone  801 mg Oral TID  . rivastigmine  9.5 mg Transdermal Daily   Continuous Infusions: . cefTRIAXone (ROCEPHIN)  IV     PRN Meds:.ipratropium-albuterol, morphine injection, zolpidem Past Medical History:  Diagnosis Date  . BPH (benign prostatic hyperplasia)   . Coronary artery disease    a. 11/2006 Cath: D1 99ost (2.5x15 Xience DES), D2 90ost (small); b. 12/2017 PCI: LAD 95 (3.25x8 & 3.0x 33 Sierra DES'. D1 patent stent. D2 30ost. EF 55-65%;  c. 08/2017 Cath: LM min irregs, LAD 40ost, patent stents, D1 patent stent, LCX nl, OM1/2/3 nl, RCA 30ost, RPDA/RPAV/RPL1/2/3 nl.  . DOE (dyspnea on exertion)    a. Chronic supplemental O2 in setting of pulm fibrosis; b. 10/2017 Echo: Nl LV size/fxn. Mild Ao sclerosis. Trace to mild TR.  [name]. GERD (gastroesophageal reflux disease)   . Hyperlipidemia   . Hypertension   . On home oxygen therapy    "2-3L; 24/7" (01/19/2017); "6L 24/7" (04/06/2018)  . OSA on CPAP   . Oxygen deficiency   . Pacemaker 04/07/2018  . Pneumonia 07/2016  . Pulmonary fibrosis (HCC) 08/26/2015  . Wears hearing aid    (sometimes)   Past Surgical History:  Procedure Laterality Date  . CATARACT EXTRACTION W/PHACO Right 01/29/2015   Procedure: CATARACT EXTRACTION PHACO  AND INTRAOCULAR LENS PLACEMENT (IOC);  Surgeon: [name], MD;  [facility];  Service: Ophthalmology;  Laterality: Right;  RESTOR LENS CPAP  . CATARACT EXTRACTION W/PHACO Left 03/12/2015   Procedure: CATARACT EXTRACTION PHACO AND INTRAOCULAR LENS PLACEMENT (IOC);  Surgeon: [name], MD;  [facility];  Service: Ophthalmology;  Laterality: Left;  RESTOR SHUGARCAINE  . COLONOSCOPY WITH ESOPHAGOGASTRODUODENOSCOPY (EGD)    . CORONARY ANGIOPLASTY WITH STENT PLACEMENT  2010   "1 stent"  . CORONARY ANGIOPLASTY WITH STENT PLACEMENT  01/19/2017  . CORONARY ATHERECTOMY N/A 01/19/2017   Procedure: CORONARY ATHERECTOMY - CSI;  Surgeon: [name], Muhammad A, MD;  Location: [facility];  Service: Cardiovascular;  Laterality: N/A;  unable to cross lesion  . CORONARY STENT INTERVENTION N/A 01/19/2017   Procedure: CORONARY STENT INTERVENTION;  Surgeon: [name], Muhammad A, MD;  Location: [facility];  Service: Cardiovascular;  Laterality: N/A;  . INGUINAL HERNIA REPAIR Bilateral   . LEFT HEART CATH AND CORONARY ANGIOGRAPHY Left 09/12/2017   Procedure: LEFT HEART CATH AND CORONARY ANGIOGRAPHY;  Surgeon: [name], Muhammad A, MD;  Location: [facility] INVASIVE CV [facility];  Service: Cardiovascular;  Laterality: Left;  . PACEMAKER IMPLANT N/A 04/07/2018   Procedure: PACEMAKER IMPLANT;  Surgeon: Marinus Mawaylor, Gregg W, MD;  [facility];  Service: Cardiovascular;  Laterality: N/A;  . RIGHT/LEFT HEART CATH AND CORONARY ANGIOGRAPHY N/A 01/12/2017   Procedure: RIGHT/LEFT HEART CATH  AND CORONARY ANGIOGRAPHY;  Surgeon: Wellington Hampshire, MD;  Location: [facility];  Service: Cardiovascular;  Laterality: N/A;   Social History   Socioeconomic History  . Marital status: Married    Spouse name: Not on file  . Number of children: Not on file  . Years of education: Not on file  . Highest education level: Not on file  Occupational History  . Occupation:  retired Engineer, drilling  Tobacco Use  . Smoking status: Never Smoker  . Smokeless tobacco: Never Used  Substance and Sexual Activity  . Alcohol use: No    Alcohol/week: 0.0 standard drinks  . Drug use: No  . Sexual activity: Yes    Comment: patient states minor  Other Topics Concern  . Not on file  Social History Narrative   Retired Education officer, environmental. Lives in [place] with wife.   Social Determinants of Health   Financial Resource Strain:   . Difficulty of Paying Living Expenses: Not on file  Food Insecurity:   . Worried About Charity fundraiser in the Last Year: Not on file  . Ran Out of Food in the Last Year: Not on file  Transportation Needs:   . Lack of Transportation (Medical): Not on file  . Lack of Transportation (Non-Medical): Not on file  Physical Activity:   . Days of Exercise per Week: Not on file  . Minutes of Exercise per Session: Not on file  Stress:   . Feeling of Stress : Not on file  Social Connections:   . Frequency of Communication with Friends and Family: Not on file  . Frequency of Social Gatherings with Friends and Family: Not on file  . Attends Religious Services: Not on file  . Active Member of Clubs or Organizations: Not on file  . Attends Archivist Meetings: Not on file  . Marital Status: Not on file  Intimate Partner Violence:   . Fear of Current or Ex-Partner: Not on file  . Emotionally Abused: Not on file  . Physically Abused: Not on file  . Sexually Abused: Not on file   No Known Allergies  Last set of Vital Signs (not current) Vitals:   02/28/19 1855 02/20/2019 0025  BP: 137/73 (!) 159/83  Pulse: 83 71  Resp:  19  Temp:  98.4 F (36.9 C)  SpO2: 98% 100%      Physical Exam  Gen: unresponsive Cardiovascular: pulseless  Resp: apneic. Breath sounds equal bilaterally with bagging  Abd: nondistended  Neuro: GCS 3, unresponsive to pain  HEENT: No blood in posterior pharynx, gag reflex absent  Neck: No crepitus   Musculoskeletal: No deformity  Skin: warm  Procedures  INTUBATION Performed by: Rudene Re Required items: required blood products, implants, devices, and special equipment available Patient identity confirmed: provided demographic data and hospital-assigned identification number Time out: Immediately prior to procedure a "time out" was called to verify the correct patient, procedure, equipment, support staff and site/side marked as required. Indications: Cardiac arrest Intubation method: Videolaryngoscopy Preoxygenation: BVM Sedatives: none Paralytic: none Tube Size: 7.5 cuffed Post-procedure assessment: chest rise and ETCO2 monitor Breath sounds: equal and absent over the epigastrium Tube secured by Respiratory Therapy Patient tolerated the procedure well with no immediate complications.  CRITICAL CARE Performed by: Rudene Re Total critical care time: 30 min Critical care time was exclusive of separately billable procedures and treating other patients. Critical care was necessary to treat or prevent imminent or life-threatening deterioration. Critical care was time spent personally  by me on the following activities: development of treatment plan with patient and/or surrogate as well as nursing, discussions with consultants, evaluation of patient's response to treatment, examination of patient, obtaining history from patient or surrogate, ordering and performing treatments and interventions, ordering and review of laboratory studies, ordering and review of radiographic studies, pulse oximetry and re-evaluation of patient's condition.  Cardiopulmonary Resuscitation (CPR) Procedure Note  Directed/Performed by: Nita Sickle I personally directed ancillary staff and/or performed CPR in an effort to regain return of spontaneous circulation and to maintain cardiac, neuro and systemic perfusion.    A/P:  38M admitted for renal colic for which I responded to a CODE  BLUE.  Upon arrival patient was in PEA arrest with ongoing CPR and being bagged by RT.  I started resuscitative efforts per ACLS protocols.  Patient was intubated with no sedation or paralytics per procedure note above.  He received several rounds of epinephrine and bicarbonate.  He received Narcan since the last medication on his chart was morphine in case of possible respiratory depression leading to the cardiac arrest. BG was 106.  In spite all the efforts patient remained in PEA arrest and time of death was called at 4:10 AM.  Around 5 AM, I was asked to return to the room to speak with patient's family about resuscitative efforts.  I had a long conversation with the family (wife and daughters).  They had several questions about events preceding the cardiac arrest.  I explained to them that unfortunately I was not able to give them any further information about that since I was not the patient's doctor but the doctor who ran the code.  I answered all the questions they had about the code.  The family then requested that the ET tube be removed for a religious ceremony.  I discussed with the [facility] in charge and after confirming with the family that they did not want an autopsy, I removed the ET tube.    Nita Sickle, MD 02/26/2019 [phone]

## 2019-03-23 NOTE — OR Nursing (Addendum)
Dr. Bernardo Heater paged to cancel case d/t pt expiring. No return paged. Called MD and left VM to call OR staff.

## 2019-03-23 NOTE — Progress Notes (Signed)
System downtime, documentation complete.

## 2019-03-23 NOTE — TOC Transition Note (Signed)
Transition of Care [facility]) - CM/SW Discharge Note   Patient Details  Name: Vincent Kennedy MRN: [id number] Date of Birth: [birth year]  [facility] CM/SW Contact:  Su Hilt, RN Phone Number: 03/11/2019, 8:56 AM   Clinical Narrative:     Patient expired        Patient Goals and CMS Choice        Discharge Placement                       Discharge Plan and Services                                     Social Determinants of Health (SDOH) Interventions     Readmission Risk Interventions No flowsheet data found.

## 2019-03-23 DEATH — deceased
